# Patient Record
Sex: Male | Born: 1960 | Race: Black or African American | Hispanic: No | Marital: Married | Smoking: Former smoker
Health system: Southern US, Community
[De-identification: ages and names within clinical notes are randomized; demographics above are authoritative.]

## PROBLEM LIST (undated history)

## (undated) ENCOUNTER — Emergency Department (HOSPITAL_COMMUNITY): Admission: EM | Payer: Self-pay | Source: Home / Self Care

## (undated) VITALS — BP 123/76 | HR 83 | Temp 97.4°F | Resp 16

## (undated) DIAGNOSIS — I428 Other cardiomyopathies: Secondary | ICD-10-CM

## (undated) DIAGNOSIS — L03119 Cellulitis of unspecified part of limb: Secondary | ICD-10-CM

## (undated) DIAGNOSIS — F1424 Cocaine dependence with cocaine-induced mood disorder: Secondary | ICD-10-CM

## (undated) DIAGNOSIS — F1410 Cocaine abuse, uncomplicated: Secondary | ICD-10-CM

## (undated) DIAGNOSIS — E119 Type 2 diabetes mellitus without complications: Secondary | ICD-10-CM

## (undated) DIAGNOSIS — F329 Major depressive disorder, single episode, unspecified: Secondary | ICD-10-CM

## (undated) DIAGNOSIS — S2243XA Multiple fractures of ribs, bilateral, initial encounter for closed fracture: Secondary | ICD-10-CM

## (undated) DIAGNOSIS — I10 Essential (primary) hypertension: Secondary | ICD-10-CM

## (undated) DIAGNOSIS — F32A Depression, unspecified: Secondary | ICD-10-CM

## (undated) DIAGNOSIS — I5022 Chronic systolic (congestive) heart failure: Secondary | ICD-10-CM

## (undated) DIAGNOSIS — L02619 Cutaneous abscess of unspecified foot: Secondary | ICD-10-CM

## (undated) HISTORY — PX: FINGER SURGERY: SHX640

## (undated) HISTORY — DX: Cocaine dependence with cocaine-induced mood disorder: F14.24

## (undated) HISTORY — DX: Cocaine abuse, uncomplicated: F14.10

## (undated) HISTORY — DX: Multiple fractures of ribs, bilateral, initial encounter for closed fracture: S22.43XA

## (undated) HISTORY — DX: Other cardiomyopathies: I42.8

## (undated) HISTORY — DX: Chronic systolic (congestive) heart failure: I50.22

---

## 1999-12-22 ENCOUNTER — Emergency Department (HOSPITAL_COMMUNITY): Admission: EM | Admit: 1999-12-22 | Discharge: 1999-12-22 | Payer: Self-pay | Admitting: Emergency Medicine

## 2000-01-06 ENCOUNTER — Emergency Department (HOSPITAL_COMMUNITY): Admission: EM | Admit: 2000-01-06 | Discharge: 2000-01-06 | Payer: Self-pay

## 2001-07-21 ENCOUNTER — Encounter: Admission: RE | Admit: 2001-07-21 | Discharge: 2001-07-21 | Payer: Self-pay | Admitting: Family Medicine

## 2001-11-24 ENCOUNTER — Encounter: Admission: RE | Admit: 2001-11-24 | Discharge: 2001-11-24 | Payer: Self-pay | Admitting: Family Medicine

## 2002-06-28 ENCOUNTER — Encounter: Admission: RE | Admit: 2002-06-28 | Discharge: 2002-06-28 | Payer: Self-pay | Admitting: Family Medicine

## 2002-10-22 ENCOUNTER — Emergency Department (HOSPITAL_COMMUNITY): Admission: EM | Admit: 2002-10-22 | Discharge: 2002-10-23 | Payer: Self-pay | Admitting: Emergency Medicine

## 2002-10-23 ENCOUNTER — Encounter: Payer: Self-pay | Admitting: *Deleted

## 2003-11-13 ENCOUNTER — Inpatient Hospital Stay (HOSPITAL_COMMUNITY): Admission: AD | Admit: 2003-11-13 | Discharge: 2003-11-19 | Payer: Self-pay | Admitting: Orthopedic Surgery

## 2003-11-13 ENCOUNTER — Emergency Department (HOSPITAL_COMMUNITY): Admission: EM | Admit: 2003-11-13 | Discharge: 2003-11-13 | Payer: Self-pay | Admitting: Family Medicine

## 2003-11-20 ENCOUNTER — Encounter (HOSPITAL_BASED_OUTPATIENT_CLINIC_OR_DEPARTMENT_OTHER): Admission: RE | Admit: 2003-11-20 | Discharge: 2003-11-27 | Payer: Self-pay | Admitting: Orthopedic Surgery

## 2004-01-30 ENCOUNTER — Ambulatory Visit: Payer: Self-pay | Admitting: Family Medicine

## 2004-04-20 ENCOUNTER — Ambulatory Visit: Payer: Self-pay | Admitting: Family Medicine

## 2005-03-15 ENCOUNTER — Ambulatory Visit: Payer: Self-pay | Admitting: Family Medicine

## 2005-03-17 ENCOUNTER — Ambulatory Visit: Payer: Self-pay | Admitting: *Deleted

## 2005-04-16 ENCOUNTER — Ambulatory Visit: Payer: Self-pay | Admitting: Family Medicine

## 2005-05-17 ENCOUNTER — Ambulatory Visit: Payer: Self-pay | Admitting: Family Medicine

## 2005-06-11 ENCOUNTER — Emergency Department (HOSPITAL_COMMUNITY): Admission: EM | Admit: 2005-06-11 | Discharge: 2005-06-11 | Payer: Self-pay | Admitting: Emergency Medicine

## 2005-06-28 ENCOUNTER — Ambulatory Visit: Payer: Self-pay | Admitting: Family Medicine

## 2005-11-18 ENCOUNTER — Ambulatory Visit: Payer: Self-pay | Admitting: Family Medicine

## 2006-03-24 ENCOUNTER — Ambulatory Visit: Payer: Self-pay | Admitting: Family Medicine

## 2006-08-02 ENCOUNTER — Ambulatory Visit: Payer: Self-pay | Admitting: Family Medicine

## 2006-08-17 ENCOUNTER — Ambulatory Visit: Payer: Self-pay | Admitting: Internal Medicine

## 2006-09-08 ENCOUNTER — Ambulatory Visit: Payer: Self-pay | Admitting: Internal Medicine

## 2006-09-08 ENCOUNTER — Ambulatory Visit (HOSPITAL_COMMUNITY): Admission: RE | Admit: 2006-09-08 | Discharge: 2006-09-08 | Payer: Self-pay | Admitting: Internal Medicine

## 2006-12-07 DIAGNOSIS — I10 Essential (primary) hypertension: Secondary | ICD-10-CM | POA: Insufficient documentation

## 2006-12-21 ENCOUNTER — Ambulatory Visit: Payer: Self-pay | Admitting: Family Medicine

## 2006-12-21 DIAGNOSIS — J309 Allergic rhinitis, unspecified: Secondary | ICD-10-CM | POA: Insufficient documentation

## 2006-12-21 LAB — CONVERTED CEMR LAB: Blood Glucose, Fingerstick: 139

## 2006-12-28 ENCOUNTER — Encounter (INDEPENDENT_AMBULATORY_CARE_PROVIDER_SITE_OTHER): Payer: Self-pay | Admitting: *Deleted

## 2007-05-01 ENCOUNTER — Ambulatory Visit: Payer: Self-pay | Admitting: Nurse Practitioner

## 2007-05-01 DIAGNOSIS — J329 Chronic sinusitis, unspecified: Secondary | ICD-10-CM | POA: Insufficient documentation

## 2007-05-01 LAB — CONVERTED CEMR LAB: Hgb A1c MFr Bld: 6.8 %

## 2007-05-08 ENCOUNTER — Ambulatory Visit: Payer: Self-pay | Admitting: Nurse Practitioner

## 2007-05-08 LAB — CONVERTED CEMR LAB
Blood in Urine, dipstick: NEGATIVE
Nitrite: NEGATIVE
Specific Gravity, Urine: >=1.03
WBC Urine, dipstick: NEGATIVE
pH: 5.5

## 2007-05-09 ENCOUNTER — Telehealth (INDEPENDENT_AMBULATORY_CARE_PROVIDER_SITE_OTHER): Payer: Self-pay | Admitting: Nurse Practitioner

## 2007-05-09 ENCOUNTER — Encounter (INDEPENDENT_AMBULATORY_CARE_PROVIDER_SITE_OTHER): Payer: Self-pay | Admitting: Nurse Practitioner

## 2007-05-09 DIAGNOSIS — E7800 Pure hypercholesterolemia, unspecified: Secondary | ICD-10-CM | POA: Insufficient documentation

## 2007-05-09 LAB — CONVERTED CEMR LAB
ALT: 15 units/L (ref 0–53)
Albumin: 4.3 g/dL (ref 3.5–5.2)
Basophils Absolute: 0 10*3/uL (ref 0.0–0.1)
CO2: 26 meq/L (ref 19–32)
Calcium: 9.3 mg/dL (ref 8.4–10.5)
Chloride: 103 meq/L (ref 96–112)
Cholesterol: 230 mg/dL — ABNORMAL HIGH (ref 0–200)
Eosinophils Relative: 3 % (ref 0–5)
Glucose, Bld: 121 mg/dL — ABNORMAL HIGH (ref 70–99)
HCT: 42.2 % (ref 39.0–52.0)
Lymphocytes Relative: 48 % — ABNORMAL HIGH (ref 12–46)
Lymphs Abs: 1.2 10*3/uL (ref 0.7–4.0)
Neutro Abs: 0.9 10*3/uL — ABNORMAL LOW (ref 1.7–7.7)
Platelets: 257 10*3/uL (ref 150–400)
Potassium: 4 meq/L (ref 3.5–5.3)
Sodium: 138 meq/L (ref 135–145)
Total Protein: 6.8 g/dL (ref 6.0–8.3)
Triglycerides: 35 mg/dL (ref <150)
WBC: 2.4 10*3/uL — ABNORMAL LOW (ref 4.0–10.5)

## 2007-06-13 ENCOUNTER — Telehealth (INDEPENDENT_AMBULATORY_CARE_PROVIDER_SITE_OTHER): Payer: Self-pay | Admitting: Nurse Practitioner

## 2007-12-20 ENCOUNTER — Ambulatory Visit: Payer: Self-pay | Admitting: Nurse Practitioner

## 2007-12-20 LAB — CONVERTED CEMR LAB: Hgb A1c MFr Bld: 6.5 %

## 2007-12-22 LAB — CONVERTED CEMR LAB
Albumin: 4.3 g/dL (ref 3.5–5.2)
Alkaline Phosphatase: 43 units/L (ref 39–117)
BUN: 13 mg/dL (ref 6–23)
CO2: 23 meq/L (ref 19–32)
Cholesterol: 185 mg/dL (ref 0–200)
Eosinophils Absolute: 0.1 10*3/uL (ref 0.0–0.7)
Eosinophils Relative: 2 % (ref 0–5)
Glucose, Bld: 107 mg/dL — ABNORMAL HIGH (ref 70–99)
HCT: 38.9 % — ABNORMAL LOW (ref 39.0–52.0)
HDL: 80 mg/dL (ref >39)
LDL Cholesterol: 98 mg/dL (ref 0–99)
Lymphocytes Relative: 45 % (ref 12–46)
Lymphs Abs: 1.4 10*3/uL (ref 0.7–4.0)
MCV: 85.7 fL (ref 78.0–100.0)
Monocytes Relative: 7 % (ref 3–12)
Platelets: 232 10*3/uL (ref 150–400)
Potassium: 4.7 meq/L (ref 3.5–5.3)
RBC: 4.54 M/uL (ref 4.22–5.81)
Sodium: 138 meq/L (ref 135–145)
Total Bilirubin: 0.4 mg/dL (ref 0.3–1.2)
Total Protein: 6.7 g/dL (ref 6.0–8.3)
Triglycerides: 34 mg/dL (ref <150)
WBC: 3.2 10*3/uL — ABNORMAL LOW (ref 4.0–10.5)

## 2008-05-22 ENCOUNTER — Encounter (INDEPENDENT_AMBULATORY_CARE_PROVIDER_SITE_OTHER): Payer: Self-pay | Admitting: Nurse Practitioner

## 2008-08-06 ENCOUNTER — Ambulatory Visit: Payer: Self-pay | Admitting: Nurse Practitioner

## 2008-08-06 DIAGNOSIS — L259 Unspecified contact dermatitis, unspecified cause: Secondary | ICD-10-CM | POA: Insufficient documentation

## 2008-08-06 LAB — CONVERTED CEMR LAB
Bilirubin Urine: NEGATIVE
Cholesterol, target level: 200 mg/dL
HDL goal, serum: 40 mg/dL
Ketones, urine, test strip: NEGATIVE
LDL Goal: 100 mg/dL
Microalb, Ur: 0.5 mg/dL (ref 0.00–1.89)
Nitrite: NEGATIVE
Protein, U semiquant: NEGATIVE
Urobilinogen, UA: 0.2

## 2008-08-13 ENCOUNTER — Ambulatory Visit: Payer: Self-pay | Admitting: Nurse Practitioner

## 2008-08-16 ENCOUNTER — Ambulatory Visit: Payer: Self-pay | Admitting: Nurse Practitioner

## 2008-08-19 DIAGNOSIS — D72819 Decreased white blood cell count, unspecified: Secondary | ICD-10-CM | POA: Insufficient documentation

## 2008-08-19 LAB — CONVERTED CEMR LAB
ALT: 13 units/L (ref 0–53)
AST: 19 units/L (ref 0–37)
Chloride: 105 meq/L (ref 96–112)
Cholesterol: 173 mg/dL (ref 0–200)
Creatinine, Ser: 0.86 mg/dL (ref 0.40–1.50)
Eosinophils Absolute: 0.1 10*3/uL (ref 0.0–0.7)
HDL: 76 mg/dL (ref >39)
LDL Cholesterol: 90 mg/dL (ref 0–99)
Lymphocytes Relative: 52 % — ABNORMAL HIGH (ref 12–46)
Lymphs Abs: 1.5 10*3/uL (ref 0.7–4.0)
Monocytes Relative: 6 % (ref 3–12)
Neutro Abs: 1.1 10*3/uL — ABNORMAL LOW (ref 1.7–7.7)
Neutrophils Relative %: 38 % — ABNORMAL LOW (ref 43–77)
Platelets: 215 10*3/uL (ref 150–400)
RBC: 5.02 M/uL (ref 4.22–5.81)
Sodium: 141 meq/L (ref 135–145)
Total Protein: 6.9 g/dL (ref 6.0–8.3)
Triglycerides: 34 mg/dL (ref <150)
WBC: 2.8 10*3/uL — ABNORMAL LOW (ref 4.0–10.5)

## 2008-08-22 ENCOUNTER — Ambulatory Visit: Payer: Self-pay | Admitting: *Deleted

## 2008-09-20 ENCOUNTER — Ambulatory Visit: Payer: Self-pay | Admitting: Nurse Practitioner

## 2008-09-20 ENCOUNTER — Telehealth (INDEPENDENT_AMBULATORY_CARE_PROVIDER_SITE_OTHER): Payer: Self-pay | Admitting: Nurse Practitioner

## 2008-09-23 ENCOUNTER — Ambulatory Visit: Payer: Self-pay | Admitting: Hematology and Oncology

## 2008-09-23 LAB — CONVERTED CEMR LAB
Basophils Absolute: 0 10*3/uL (ref 0.0–0.1)
Basophils Relative: 1 % (ref 0–1)
Eosinophils Absolute: 0.1 10*3/uL (ref 0.0–0.7)
Eosinophils Relative: 4 % (ref 0–5)
Hemoglobin: 13.6 g/dL (ref 13.0–17.0)
MCHC: 32 g/dL (ref 30.0–36.0)
MCV: 87.4 fL (ref 78.0–100.0)
Monocytes Absolute: 0.2 10*3/uL (ref 0.1–1.0)
Monocytes Relative: 9 % (ref 3–12)
Neutro Abs: 1.1 10*3/uL — ABNORMAL LOW (ref 1.7–7.7)
RDW: 15.3 % (ref 11.5–15.5)

## 2008-09-26 ENCOUNTER — Encounter (INDEPENDENT_AMBULATORY_CARE_PROVIDER_SITE_OTHER): Payer: Self-pay | Admitting: Nurse Practitioner

## 2008-09-26 LAB — CBC WITH DIFFERENTIAL/PLATELET
BASO%: 0.6 % (ref 0.0–2.0)
MCHC: 34.6 g/dL (ref 32.0–36.0)
MONO#: 0.2 10*3/uL (ref 0.1–0.9)
RBC: 4.71 10*6/uL (ref 4.20–5.82)
RDW: 15 % — ABNORMAL HIGH (ref 11.0–14.6)
WBC: 2.6 10*3/uL — ABNORMAL LOW (ref 4.0–10.3)
lymph#: 1.1 10*3/uL (ref 0.9–3.3)

## 2008-09-26 LAB — MORPHOLOGY
PLT EST: ADEQUATE
RBC Comments: NORMAL

## 2008-10-01 LAB — COMPREHENSIVE METABOLIC PANEL
ALT: 13 U/L (ref 0–53)
Alkaline Phosphatase: 51 U/L (ref 39–117)
Glucose, Bld: 121 mg/dL — ABNORMAL HIGH (ref 70–99)
Sodium: 140 mEq/L (ref 135–145)
Total Bilirubin: 0.3 mg/dL (ref 0.3–1.2)
Total Protein: 7 g/dL (ref 6.0–8.3)

## 2008-10-01 LAB — VITAMIN B12: Vitamin B-12: 278 pg/mL (ref 211–911)

## 2008-10-07 ENCOUNTER — Ambulatory Visit (HOSPITAL_COMMUNITY): Admission: RE | Admit: 2008-10-07 | Discharge: 2008-10-07 | Payer: Self-pay | Admitting: Hematology and Oncology

## 2008-10-10 ENCOUNTER — Encounter (INDEPENDENT_AMBULATORY_CARE_PROVIDER_SITE_OTHER): Payer: Self-pay | Admitting: Nurse Practitioner

## 2009-02-12 ENCOUNTER — Ambulatory Visit: Payer: Self-pay | Admitting: Internal Medicine

## 2009-02-12 DIAGNOSIS — M5380 Other specified dorsopathies, site unspecified: Secondary | ICD-10-CM | POA: Insufficient documentation

## 2009-02-12 LAB — CONVERTED CEMR LAB: Blood Glucose, Fingerstick: 221

## 2009-02-25 ENCOUNTER — Emergency Department (HOSPITAL_COMMUNITY): Admission: EM | Admit: 2009-02-25 | Discharge: 2009-02-25 | Payer: Self-pay | Admitting: Emergency Medicine

## 2009-02-25 ENCOUNTER — Telehealth (INDEPENDENT_AMBULATORY_CARE_PROVIDER_SITE_OTHER): Payer: Self-pay | Admitting: Nurse Practitioner

## 2009-02-25 DIAGNOSIS — M5412 Radiculopathy, cervical region: Secondary | ICD-10-CM | POA: Insufficient documentation

## 2009-04-08 ENCOUNTER — Telehealth (INDEPENDENT_AMBULATORY_CARE_PROVIDER_SITE_OTHER): Payer: Self-pay | Admitting: Nurse Practitioner

## 2009-04-28 ENCOUNTER — Ambulatory Visit: Payer: Self-pay | Admitting: Hematology and Oncology

## 2009-08-22 ENCOUNTER — Emergency Department (HOSPITAL_COMMUNITY): Admission: EM | Admit: 2009-08-22 | Discharge: 2009-08-22 | Payer: Self-pay | Admitting: Family Medicine

## 2009-10-17 ENCOUNTER — Encounter (INDEPENDENT_AMBULATORY_CARE_PROVIDER_SITE_OTHER): Payer: Self-pay | Admitting: Nurse Practitioner

## 2010-05-03 ENCOUNTER — Encounter: Payer: Self-pay | Admitting: Internal Medicine

## 2010-05-12 NOTE — Letter (Signed)
Summary: MAILED REQUESTED RECORDS TO [facility]  MAILED REQUESTED RECORDS TO [facility]   Imported By: Arta Bruce 10/17/2009 12:00:15  _____________________________________________________________________  External Attachment:    Type:   Image     Comment:   External Document

## 2010-05-14 NOTE — Letter (Signed)
Summary: HEMATOLOGY/MEDICAL ONCOLOGY  HEMATOLOGY/MEDICAL ONCOLOGY   Imported By: Arta Bruce 04/20/2010 16:56:33  _____________________________________________________________________  External Attachment:    Type:   Image     Comment:   External Document

## 2010-05-14 NOTE — Letter (Signed)
Summary: HEMATOLOGY/MEDICAL ONCOLOGY  HEMATOLOGY/MEDICAL ONCOLOGY   Imported By: Arta Bruce 04/20/2010 16:54:51  _____________________________________________________________________  External Attachment:    Type:   Image     Comment:   External Document

## 2010-06-30 LAB — GLUCOSE, CAPILLARY

## 2010-07-15 LAB — GLUCOSE, CAPILLARY: Glucose-Capillary: 87 mg/dL (ref 70–99)

## 2010-08-10 ENCOUNTER — Emergency Department (HOSPITAL_COMMUNITY)
Admission: EM | Admit: 2010-08-10 | Discharge: 2010-08-10 | Disposition: A | Discharge status: Other Acute Inpatient Hospital | Payer: Self-pay | Attending: Emergency Medicine | Admitting: Emergency Medicine

## 2010-08-10 ENCOUNTER — Inpatient Hospital Stay (HOSPITAL_COMMUNITY)
Admission: AD | Admit: 2010-08-10 | Discharge: 2010-08-13 | DRG: 897 | Disposition: A | Discharge status: Home / Self Care | Payer: PRIVATE HEALTH INSURANCE | Source: Ambulatory Visit | Attending: Psychiatry | Admitting: Psychiatry

## 2010-08-10 DIAGNOSIS — R000 Tachycardia, unspecified: Secondary | ICD-10-CM | POA: Insufficient documentation

## 2010-08-10 DIAGNOSIS — Z91199 Patient's noncompliance with other medical treatment and regimen due to unspecified reason: Secondary | ICD-10-CM

## 2010-08-10 DIAGNOSIS — E119 Type 2 diabetes mellitus without complications: Secondary | ICD-10-CM | POA: Insufficient documentation

## 2010-08-10 DIAGNOSIS — I10 Essential (primary) hypertension: Secondary | ICD-10-CM | POA: Insufficient documentation

## 2010-08-10 DIAGNOSIS — F1994 Other psychoactive substance use, unspecified with psychoactive substance-induced mood disorder: Secondary | ICD-10-CM

## 2010-08-10 DIAGNOSIS — F1920 Other psychoactive substance dependence, uncomplicated: Secondary | ICD-10-CM

## 2010-08-10 DIAGNOSIS — Z794 Long term (current) use of insulin: Secondary | ICD-10-CM

## 2010-08-10 DIAGNOSIS — F3289 Other specified depressive episodes: Secondary | ICD-10-CM | POA: Insufficient documentation

## 2010-08-10 DIAGNOSIS — F329 Major depressive disorder, single episode, unspecified: Secondary | ICD-10-CM | POA: Insufficient documentation

## 2010-08-10 DIAGNOSIS — Z9119 Patient's noncompliance with other medical treatment and regimen: Secondary | ICD-10-CM

## 2010-08-10 DIAGNOSIS — F1420 Cocaine dependence, uncomplicated: Principal | ICD-10-CM

## 2010-08-10 DIAGNOSIS — F1010 Alcohol abuse, uncomplicated: Secondary | ICD-10-CM | POA: Insufficient documentation

## 2010-08-10 LAB — COMPREHENSIVE METABOLIC PANEL
ALT: 30 U/L (ref 0–53)
AST: 24 U/L (ref 0–37)
Alkaline Phosphatase: 77 U/L (ref 39–117)
CO2: 25 mEq/L (ref 19–32)
Calcium: 9.6 mg/dL (ref 8.4–10.5)
Chloride: 96 mEq/L (ref 96–112)
GFR calc Af Amer: >60 mL/min (ref >60)
GFR calc non Af Amer: >60 mL/min (ref >60)
Glucose, Bld: 447 mg/dL — ABNORMAL HIGH (ref 70–99)
Potassium: 3.9 mEq/L (ref 3.5–5.1)
Sodium: 131 mEq/L — ABNORMAL LOW (ref 135–145)

## 2010-08-10 LAB — GLUCOSE, CAPILLARY
Glucose-Capillary: 310 mg/dL — ABNORMAL HIGH (ref 70–99)
Glucose-Capillary: 433 mg/dL — ABNORMAL HIGH (ref 70–99)

## 2010-08-10 LAB — CBC
HCT: 46 % (ref 39.0–52.0)
Hemoglobin: 16.2 g/dL (ref 13.0–17.0)
RBC: 5.5 MIL/uL (ref 4.22–5.81)
WBC: 3.5 10*3/uL — ABNORMAL LOW (ref 4.0–10.5)

## 2010-08-10 LAB — RAPID URINE DRUG SCREEN, HOSP PERFORMED
Cocaine: POSITIVE — AB
Tetrahydrocannabinol: NOT DETECTED

## 2010-08-10 LAB — ETHANOL: Alcohol, Ethyl (B): <5 mg/dL (ref 0–10)

## 2010-08-11 DIAGNOSIS — F1420 Cocaine dependence, uncomplicated: Secondary | ICD-10-CM

## 2010-08-11 LAB — GLUCOSE, CAPILLARY
Glucose-Capillary: 492 mg/dL — ABNORMAL HIGH (ref 70–99)
Glucose-Capillary: 179 mg/dL — ABNORMAL HIGH (ref 70–99)

## 2010-08-11 LAB — URINALYSIS, ROUTINE W REFLEX MICROSCOPIC
Bilirubin Urine: NEGATIVE
Glucose, UA: >1000 mg/dL — AB
Hgb urine dipstick: NEGATIVE
Specific Gravity, Urine: 1.036 — ABNORMAL HIGH (ref 1.005–1.030)

## 2010-08-11 LAB — URINE MICROSCOPIC-ADD ON: Urine-Other: NONE SEEN

## 2010-08-12 LAB — GLUCOSE, CAPILLARY: Glucose-Capillary: 323 mg/dL — ABNORMAL HIGH (ref 70–99)

## 2010-08-12 LAB — HEMOGLOBIN A1C: Hgb A1c MFr Bld: 13.1 % — ABNORMAL HIGH (ref <5.7)

## 2010-08-13 LAB — GLUCOSE, CAPILLARY: Glucose-Capillary: 267 mg/dL — ABNORMAL HIGH (ref 70–99)

## 2010-08-14 NOTE — Discharge Summary (Signed)
  NAME:  KDYN, VONBEHREN NO.:  [id number]  MEDICAL RECORD NO.:  [id number]           PATIENT TYPE:  I  LOCATION:  0306                          [facility]ILITY[facility]  PHYSICIAN:  Anselm Jungling, MD  DATE OF BIRTH:  12-16-1960  DATE OF ADMISSION:  08/10/2010 DATE OF DISCHARGE:  08/13/2010                              DISCHARGE SUMMARY   IDENTIFYING DATA/REASON FOR ADMISSION:  This was an inpatient psychiatric admission for James [name], a 50 year old married African American male who was admitted for treatment of substance dependence.  Please refer to the admission note for further details pertaining to the symptoms, circumstances and history that led to his hospitalization.  He was given an initial Axis I diagnosis of cocaine dependence.  MEDICAL AND LABORATORY:  The patient was medically and physically assessed by the psychiatric nurse practitioner.  He was in good health, but had a history of diabetes mellitus, which he had not been attending to with routine medical care.  His blood sugars routinely ran in the 300 range.  He was placed on an insulin protocol, and at the time of discharge, he was discharged on a regimen of Lantus, glyburide, and metformin, along with appropriate referrals for follow-up.  See below. Aside from this, there were no significant medical issues.  HOSPITAL COURSE:  The patient was admitted to the adult inpatient psychiatric service.  He presented as a slender, normally-developed adult male who was very pleasant and cooperative, but with sad mood. His father passed away few months ago.  He indicated that he had had longstanding issues with substance abuse, but had been clean and sober for most of 5 years, until several months ago, when he relapsed on cocaine.  He expressed a lot of remorse for his relapse and its effect on his family.  He described good supports in his wife, and discharge pastor and the church community in general.  His sister  had also encouraged him to get treatment.  He participated in therapeutic groups and activities geared towards 12- step recovery.  He was an excellent participant throughout.  He was appropriate for discharge on the fourth hospital day.  He agreed to the following aftercare plan.  At the time of discharge, the suicide risk assessment was completed and he was felt to be at minimal risk.  AFTERCARE:  The patient was to follow-up at [facility] with appointments on May 23 at noon and May 30 at 2:30 p.m.  DISCHARGE MEDICATIONS: 1. Lantus insulin 10 mg subcu daily. 2. Glyburide 5 mg daily. 3. Metformin 500 mg daily.  DIAGNOSES:  AXIS I:  Cocaine dependence. AXIS II:  Deferred. AXIS III:  Diabetes mellitus, insulin-dependent. AXIS IV:  Stressors severe. AXIS V: GAF on discharge 50.     Anselm Jungling, MD     SPB/MEDQ  D:  08/13/2010  T:  08/13/2010  Job:  [id number]  Electronically Signed by Geralyn Flash MD on 08/14/2010 09:03:22 AM

## 2010-08-17 NOTE — H&P (Signed)
NAME:  James [name], James [name] NO.:  [id number]  MEDICAL RECORD NO.:  [id number]           PATIENT TYPE:  I  LOCATION:  0306                          FACILITY:  [facility]  PHYSICIAN:  Anselm Jungling, MD  DATE OF BIRTH:  21-Mar-1961  DATE OF ADMISSION:  08/10/2010 DATE OF DISCHARGE:                      PSYCHIATRIC ADMISSION ASSESSMENT   This is on a 50 year old male voluntarily admitted on August 10, 2010.  HISTORY OF PRESENT ILLNESS:  The patient is here as he relapsed on crack cocaine in November.  He was sober for approximately 5 months and then relapsed.  He has been using daily.  He has been using cocaine on and off for the past 12 years, did have a period of sobriety.  He also has been drinking some alcohol, but denies any seizures or withdrawal symptoms, denies any suicidal thoughts.  He was hoping for some placement, but he has a court date pending tomorrow and he feels he may need to attend his court case.  He has a history of medical issues and has been noncompliant with his diabetes for at least 1 year.  PAST PSYCHIATRIC HISTORY:  First admission to [facility]. No current outpatient mental health treatment.  SOCIAL HISTORY:  He is a 50 year old married male.  He has 3 children, 2 are adults.  He lives in [place].  He is unemployed.  Again, he has a court date pending tomorrow related to a vehicle registration.  FAMILY HISTORY:  None.  ALCOHOL AND DRUG HISTORY:  Longest history of sobriety has been 5 years. He denies any seizures and denies any IV drug use.  PRIMARY CARE PROVIDER:  None.  Has been noncompliant with follow-up for at least 1 year.  He was going to [facility] in the past.  MEDICAL PROBLEMS:  History of non-insulin diabetes mellitus.  MEDICATIONS:  He lists metformin, but again off medications for 1 year.  DRUG ALLERGIES:  NO KNOWN ALLERGIES.  PHYSICAL EXAMINATION:  GENERAL:  This is a slender middle-aged male in no  distress.  He voices no complaints.  Sodium of 131.  Urine drug screen is positive for cocaine.  Alcohol level less than 5.  His glucoses were elevated at 439, down to 185 on discharge.  MENTAL STATUS EXAM:  He is fully alert and cooperative with fair eye contact, cooperative, casually dressed.  Speech soft-spoken, but normal pace.  Mood; he feels clear in his head.  He denies any psychotic symptoms.  Thought processes are coherent, goal directed.  He wants help.  Axis I:  Cocaine dependence, substance-induced mood disorder. Axis II:  Deferred. Axis III:  History of non-insulin-dependent diabetes. Axis IV:  Medical problems, other psychosocial problems rated to chronic substance use and legal system. Axis V:  Global Assessment of Functioning current is 40.  Our plan is to check his blood sugars twice a day.  We will have Librium available on a very temporary basis.  We will address other comorbidities.  Assess his motivation for rehab.  Encourage follow-up with his medical issues.  Tentative length of stay is 2-3 days.     Landry Corporal, N.P.  ______________________________ Anselm Jungling, MD    JO/MEDQ  D:  08/11/2010  T:  08/12/2010  Job:  [id number]  Electronically Signed by Limmie PatriciaP. on 08/17/2010 09:41:36 AM Electronically Signed by Geralyn Flash MD on 08/17/2010 12:12:06 PM

## 2010-08-28 NOTE — Discharge Summary (Signed)
NAME:  James [name], James [name] NO.:  [id number]   MEDICAL RECORD NO.:  [id number]                   PATIENT TYPE:  INP   LOCATION:  5702                                 FACILITY:  [facility]   PHYSICIAN:  Dionne Ano. Amanda Pea, M.D.             DATE OF BIRTH:  10/05/1960   DATE OF ADMISSION:  11/13/2003  DATE OF DISCHARGE:  11/19/2003                                 DISCHARGE SUMMARY   ADMITTING DIAGNOSES:  1.  Abscess about the right hand and middle finger.  2.  History of diabetes mellitus, poorly controlled.  3.  History of crack cocaine abuse.  4.  History of tobacco abuse.  5.  History of ETOH abuse.   DISCHARGE DIAGNOSES:  1.  Abscess about the right hand and middle finger, improved.  2.  History of diabetes mellitus, poorly controlled.  3.  History of crack cocaine abuse.  4.  History of tobacco abuse.  5.  History of ETOH abuse.   PROCEDURES:  I&D of right and middle finger urgently by Dr. Dionne Ano.  Gramig.   CONSULTS:  Behavioral Health/psychiatry, Dr. Antonietta Breach.   BRIEF HISTORY:  James [name] is a 50 year old gentleman who presented to the  [facility] with pain, tenderness and swelling of  the middle finger of the right hand, noted for quite a few days, however, he  had not sought any medical attention for this problem until November 13, 2003.  Hand surgery and upper extremity surgeon, Dr. Dominica Severin, was contact  and evaluated the patient.  He was found to have an abscess deep in location  to the right hand and middle finger.  He was given a local anesthetic block  at the wrist and the area was I&D'ed without difficulty; he tolerated this  very well; this was performed in the emergency room setting.  Intraoperative  cultures were obtained.  He was to be admitted for IV antibiotics, whirlpool  and await final culture for appropriate antibiotic regime.  In addition to  this, given his dependent history of drugs and  alcohol without withdrawal  noted by the patient, a Behavioral Health consult was placed.   HOSPITAL COURSE:  The patient was admitted on November 13, 2003.  A Behavioral  Health consult was placed.  He was started on IV antibiotics in the form of  vancomycin, prophylactic, to cover for MRSA.  Intraoperative cultures were  pending.  He underwent a lengthy stay for wound care including hydrotherapy,  dressing changes and range of motion.  His condition continued to improve as  it became less tender and his motion improved.  Final cultures were obtained  that showed abundant MRSA, sensitive to vancomycin and tetracycline.  Patient's overall condition improved.  He was seen and evaluated by  [facility], who determined that he would be appropriate for  outpatient therapy.  Given the patient's financial situation, he declined  any type therapy that would be costly to him.  He was noted to be poorly  controlled in terms of his diabetes; he was on a sliding-scale insulin and  there were a few incidences on inpatient where he was noted to be eating  sweets brought in from home, despite redirection.  On postoperative day #4,  he was doing well, he was afebrile and he underwent a local debridement at  bedside; he tolerated this well.  He continued wet-to-dry dressing changes  and hydrotherapy.  His condition continued to improve.  His CBGs continued  to wax and wane, highs being in the mid-200s, despite his oral medications  and sliding-scale insulin, however, he remained asymptomatic.  On  postoperative day #6, he was doing well overall, much improved, vital signs  stable, he was afebrile.  His CBG was 265.  Initial x-rays were negative for  any osteomyelitis.  Dressing was changed.  He had no signs of  deep abscess  returning, no active discharge.  Neurovascularly, he was intact and his  range of motion was intact.   FINAL DIAGNOSES:  1.  Status post I&D of the right middle finger and hand  abscess with      cultures indicative of MRSA.  2.  History of poorly controlled diabetes mellitus.  3.  History of crack cocaine abuse.  4.  History of alcohol abuse.  5.  History of tobacco abuse.   PLAN:   CONDITION ON DISCHARGE:  Improved.   DIET:  Diet is a diabetic diet.   WOUND CARE:  Outpatient therapy for whirlpool and dressing changes.  He  needs to move his fingers frequently and keep his dressing clean and dry.  He will be given home dressing supplies to do wet-to-dry dressing changes.   DISCHARGE MEDICATIONS:  1.  Doxycycline 100 mg b.i.d. for 4 weeks.  2.  Percocet and Robaxin for pain and spasm.   FOLLOWUP:  Followup will be with outpatient hydrotherapy and dressing  changes.  He will need b.i.d. wet-to-dry dressing changes and home dressing  changes on the days when hydrotherapy is closed.  He will follow up with Dr.  Amanda Pea in 1 week; call [phone] for an appointment, questions or concerns.  We additionally instructed to have him follow up with a primary care  physician, however, he is somewhat resistant to this.      Karie Chimera, P.A.-C.                   Dionne Ano. Amanda Pea, M.D.    BB/MEDQ  D:  12/20/2003  T:  12/21/2003  Job:  [id number]

## 2010-08-28 NOTE — H&P (Signed)
NAME:  James, [name] NO.:  [id number]   MEDICAL RECORD NO.:  [id number]                   PATIENT TYPE:  EMS   LOCATION:  URG                                  FACILITY:  [facility]   PHYSICIAN:  Dionne Ano. Everlene Other, M.D.         DATE OF BIRTH:  29-Nov-1960   DATE OF ADMISSION:  11/13/2003  DATE OF DISCHARGE:                                HISTORY & PHYSICAL   HISTORY OF PRESENT ILLNESS:  James [name] is a 50 year old who presents to  the [facility] for evaluation of an infected right hand.  The  patient is 50 years of age.  He notes that he has pain and swelling in the  middle finger of the right hand.  He admits to crack cocaine abuse.  He is  here with his wife.  He has noticed the swelling for some days but has not  sought any medical attention.  He is a diabetic.  The patient has not been  home in a few days according to his wife.  I have reviewed all issues at  length.  He has an x-ray which is negative.  He has a grossly infected  finger that I have been asked to see.   PAST MEDICAL HISTORY:  Diabetes mellitus with poor control and poor follow  up.   PAST SURGICAL HISTORY:  None.   MEDICATIONS:  Avandia and Glucovance.   ALLERGIES:  None.   SOCIAL HISTORY:  He is unemployed.  He smokes 4-5 cigarettes a day.  He  occasionally consumes alcohol.   PHYSICAL EXAMINATION:  VITAL SIGNS:  Stable.  GENERAL:  The patient is alert.  HEENT:  Within normal limits.  CHEST:  Clear.  ABDOMEN:  Benign.  EXTREMITIES:  Lower extremity examination is benign.  Left upper extremity  reveals normal bony stability and alignment.  The patient's right middle  finger is grossly infected with a large, deep abscess about the dorsal and  volar aspects.  This is not a typical paronychial process but a large deep  abscess extending into the finger.  I have reviewed this at length.  His FDP  and FDS tendons are poorly functioning due to pain.  He has no frank  Navel's  signs into the palm or proximal wrist region.   X-rays are reviewed which show no fracture dislocation, osteomyelitis, or  space occupying lesion.   IMPRESSION:  Abscess deep in location right hand and middle finger.   PLAN:  The patient was verbally consented and discussed I&D.  Following  this, he was given a wrist block anesthetic with lidocaine.  Following this,  he was prepped and draped in the usual sterile fashion with Betadine scrub  and paint.  Once this was done, an incision was made and deep abscess was  noted.  This was cultured with aerobic and anaerobic cultures.  He tolerated  this well, there were no complicating features.  Following  this, I debrided  the skin, subcutaneous tissues, and followed the tracking dorsally and  volarly ascending into the finger.  Once this was done, I removed  devitalized tissue.  Following this, the patient had the wound dressed with  Adaptic followed by gauze and a sterile dressing.  He did have ulcerative  type areas which were quite deep and significant and due to this, I  discussed with him the implications and possible issues of loss of his  finger.   He was admitted to the hospital for IV antibiotics for likely presumed  community acquired MRSA.  We will await the culture results.  We will give  him a tetanus shot if not already given, monitor his blood sugars closely,  and proceed accordingly with his care.  He will need whirlpool/hydrotherapy  and close observation.  He would like to obtain help for his crack cocaine  abuse and we will consult behavioral health for this.  All questions have  been encouraged and answered.                                                Dionne Ano. Everlene Other, M.D.    Nash Mantis  D:  11/13/2003  T:  11/13/2003  Job:  [id number]

## 2012-02-26 ENCOUNTER — Emergency Department (HOSPITAL_COMMUNITY): Payer: Self-pay

## 2012-02-26 ENCOUNTER — Encounter (HOSPITAL_COMMUNITY): Payer: Self-pay | Admitting: Emergency Medicine

## 2012-02-26 ENCOUNTER — Emergency Department (HOSPITAL_COMMUNITY)
Admission: EM | Admit: 2012-02-26 | Discharge: 2012-02-28 | Disposition: A | Discharge status: Home / Self Care | Payer: Self-pay | Attending: Emergency Medicine | Admitting: Emergency Medicine

## 2012-02-26 DIAGNOSIS — E119 Type 2 diabetes mellitus without complications: Secondary | ICD-10-CM | POA: Insufficient documentation

## 2012-02-26 DIAGNOSIS — R0602 Shortness of breath: Secondary | ICD-10-CM | POA: Insufficient documentation

## 2012-02-26 DIAGNOSIS — R0789 Other chest pain: Secondary | ICD-10-CM | POA: Insufficient documentation

## 2012-02-26 DIAGNOSIS — R079 Chest pain, unspecified: Secondary | ICD-10-CM

## 2012-02-26 DIAGNOSIS — R45851 Suicidal ideations: Secondary | ICD-10-CM

## 2012-02-26 DIAGNOSIS — F1910 Other psychoactive substance abuse, uncomplicated: Secondary | ICD-10-CM

## 2012-02-26 HISTORY — DX: Type 2 diabetes mellitus without complications: E11.9

## 2012-02-26 LAB — RAPID URINE DRUG SCREEN, HOSP PERFORMED
Amphetamines: NOT DETECTED
Benzodiazepines: NOT DETECTED
Cocaine: POSITIVE — AB
Opiates: NOT DETECTED
Tetrahydrocannabinol: POSITIVE — AB

## 2012-02-26 LAB — CBC WITH DIFFERENTIAL/PLATELET
Eosinophils Relative: 3 % (ref 0–5)
Lymphocytes Relative: 34 % (ref 12–46)
Lymphs Abs: 1.4 10*3/uL (ref 0.7–4.0)
MCV: 84.2 fL (ref 78.0–100.0)
Platelets: 239 10*3/uL (ref 150–400)
RBC: 4.87 MIL/uL (ref 4.22–5.81)
WBC: 4.2 10*3/uL (ref 4.0–10.5)

## 2012-02-26 LAB — BASIC METABOLIC PANEL
CO2: 27 mEq/L (ref 19–32)
Calcium: 9.3 mg/dL (ref 8.4–10.5)
Chloride: 98 mEq/L (ref 96–112)
Glucose, Bld: 258 mg/dL — ABNORMAL HIGH (ref 70–99)
Potassium: 3.9 mEq/L (ref 3.5–5.1)
Sodium: 133 mEq/L — ABNORMAL LOW (ref 135–145)

## 2012-02-26 LAB — GLUCOSE, CAPILLARY
Glucose-Capillary: 213 mg/dL — ABNORMAL HIGH (ref 70–99)
Glucose-Capillary: 287 mg/dL — ABNORMAL HIGH (ref 70–99)

## 2012-02-26 MED ORDER — LORAZEPAM 1 MG PO TABS
1.0000 mg | ORAL_TABLET | ORAL | Status: DC | PRN
  Administered 2012-02-27 – 2012-02-28 (×2): 1 mg via ORAL
  Filled 2012-02-26 (×2): qty 1

## 2012-02-26 MED ORDER — ASPIRIN 81 MG PO CHEW
324.0000 mg | CHEWABLE_TABLET | Freq: Once | ORAL | Status: AC
  Administered 2012-02-26: 324 mg via ORAL
  Filled 2012-02-26: qty 4

## 2012-02-26 MED ORDER — ONDANSETRON HCL 8 MG PO TABS: 4.0000 mg | ORAL_TABLET | Freq: Three times a day (TID) | ORAL | Status: DC | PRN

## 2012-02-26 MED ORDER — METFORMIN HCL 500 MG PO TABS
500.0000 mg | ORAL_TABLET | Freq: Two times a day (BID) | ORAL | Status: DC
  Administered 2012-02-26 – 2012-02-28 (×5): 500 mg via ORAL
  Filled 2012-02-26 (×5): qty 1

## 2012-02-26 MED ORDER — ACETAMINOPHEN 325 MG PO TABS
650.0000 mg | ORAL_TABLET | ORAL | Status: DC | PRN
  Administered 2012-02-26: 650 mg via ORAL
  Filled 2012-02-26: qty 2

## 2012-02-26 MED ORDER — NICOTINE 21 MG/24HR TD PT24
21.0000 mg | MEDICATED_PATCH | Freq: Every day | TRANSDERMAL | Status: DC
  Filled 2012-02-26 (×3): qty 1

## 2012-02-26 NOTE — ED Notes (Signed)
First meeting with patient. Patient states he need help with SI and detox from drugs. Patient state he has a headache and denies chest pain and/or SOB at this time. Patient was given a breakfast tray and is eating with NAD at this time.

## 2012-02-26 NOTE — ED Provider Notes (Addendum)
James [name] is a 51 y.o. male was initially here for her detoxification from cocaine. After he was told that that was not available. He stated he was suicidal. He continues to feel that way. He threatens to put a razor to his wrist and cut. He is asked the sitter for a razor. He was seen by [facility] and felt that he was stable for discharge. On my evaluation. He is not  stable for discharge. He continues to be suicidal. When he initially talked to [facility], he was not communicative. When he was talking again in the afternoon; he was communicative and related suicidality to them as well. He is homeless. He left his home with his wife because he was using drugs and stole things from her.   [facility] is attempting to place the patient  Flint Melter, MD 02/26/12 1728  Flint Melter, MD 02/26/12 [phone]

## 2012-02-26 NOTE — ED Notes (Signed)
Patient verbalized that he had thoughts of harming himself yesterday.  Notified Dr. Bebe Shaggy of patient comment

## 2012-02-26 NOTE — ED Notes (Signed)
Patient requesting update on status. Updated patient on current POC and pending placement

## 2012-02-26 NOTE — ED Notes (Signed)
EMS reports that patient was laying outside complaining of chest pain patient admitted to cocaine use tonight and has been using the past 8 months and verbalizes that he wants help.

## 2012-02-26 NOTE — ED Notes (Signed)
Meal tray ordered 

## 2012-02-26 NOTE — ED Notes (Signed)
Patient ask sitter for a blade to kill himself with and this is after patient called his wife and daughter to let them know he is in the hospital. [facility] Baxter Hire) advised. Sitter remains at bedside.

## 2012-02-26 NOTE — ED Notes (Signed)
Patient sleeping with NAD

## 2012-02-26 NOTE — ED Notes (Signed)
Patient skin felt hot to touch and patient had non-productive cough. Lung Sounds are clear, all lobes, bilaterally.
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
## 2012-02-26 NOTE — ED Provider Notes (Signed)
History     CSN: [id number]  Arrival date & time 02/26/12  0505   First MD Initiated Contact with Patient 02/26/12 [id number]      Chief Complaint  Patient presents with  . Chest Pain    Patient is a 51 y.o. male presenting with chest pain. The history is provided by the patient.  Chest Pain Episode onset: an uknown time ago. Episode Length: an unknown time. Chest pain occurs constantly. The chest pain is resolved. Associated with: unknown. The severity of the pain is mild. The quality of the pain is described as pressure-like. The pain does not radiate. Primary symptoms include shortness of breath.   Pt reports he has had chest pain.  He is unable to tell me when it started and also unable to tell me how long it lasted.  He reports it is improved.  He reports crack cocaine use, but reports he was not using crack when CP started.   He reports he has never had this CP before  He is requesting detox from crack cocaine.  He reports he uses alcohol intermittently but admits that crack cocaine is his drug of choice  He denies known h/o CAD.   He denies pleuritic type CP   Past Medical History  Diagnosis Date  . Diabetes mellitus without complication     History reviewed. No pertinent past surgical history.  No family history on file.  History  Substance Use Topics  . Smoking status: Current Every Day Smoker -- 1.0 packs/day  . Smokeless tobacco: Not on file  . Alcohol Use:       Review of Systems  Respiratory: Positive for shortness of breath.   Cardiovascular: Positive for chest pain.  All other systems reviewed and are negative.    Allergies  Review of patient's allergies indicates no known allergies.  Home Medications  No current outpatient prescriptions on file.  BP 130/74  Temp 97.9 F (36.6 C) (Oral)  Resp 20  Ht 6' (1.829 m)  Wt 174 lb (78.926 kg)  BMI 23.60 kg/m2  SpO2 100%  Physical Exam CONSTITUTIONAL: Well developed/well nourished, no distress,  resting comfortably HEAD AND FACE: Normocephalic/atraumatic EYES: EOMI/PERRL ENMT: Mucous membranes moist NECK: supple no meningeal signs SPINE:entire spine nontender CV: S1/S2 noted, no murmurs/rubs/gallops noted LUNGS: Lungs are clear to auscultation bilaterally, no apparent distress Chest - nontender, no crepitance ABDOMEN: soft, nontender, no rebound or guarding GU:no cva tenderness NEURO: Pt is awake/alert, moves all extremitiesx4 EXTREMITIES: pulses normal, full ROM SKIN: warm, color normal PSYCH: no abnormalities of mood noted  ED Course  Procedures  6:50 AM I told patient that he can f/u as outpatient for detox for cocaine.  He later told nurse he wanted to harm himself.  D/w ACT, will see patient.  Will consult telepsych  On re-exam, pt is half asleep, no distress.   I doubt ACS/PE/dissection at this time given his history/exam.  Do not feel further chest pain workup/labs/imaging required.    MDM  Nursing notes including past medical history and social history reviewed and considered in documentation xrays reviewed and considered Labs/vital reviewed and considered        Date: 02/26/2012  Rate: 91  Rhythm: normal sinus rhythm  QRS Axis: right  Intervals: normal  ST/T Wave abnormalities: nonspecific ST changes  Conduction Disutrbances:none  Narrative Interpretation:   Old EKG Reviewed: none available at time of interpretation    Joya Gaskins, MD 02/26/12 0700

## 2012-02-26 NOTE — ED Notes (Signed)
Patient using phone at desk 

## 2012-02-27 LAB — GLUCOSE, CAPILLARY
Glucose-Capillary: 241 mg/dL — ABNORMAL HIGH (ref 70–99)
Glucose-Capillary: 246 mg/dL — ABNORMAL HIGH (ref 70–99)

## 2012-02-27 MED ORDER — INSULIN ASPART PROT & ASPART (70-30 MIX) 100 UNIT/ML ~~LOC~~ SUSP
10.0000 [IU] | Freq: Every day | SUBCUTANEOUS | Status: DC
  Administered 2012-02-27: 10 [IU] via SUBCUTANEOUS
  Filled 2012-02-27 (×2): qty 3

## 2012-02-27 NOTE — ED Notes (Signed)
First meeting with patient. Patient awake and sitting up in bed eating breakfast with NAD. Sitter at bedside.

## 2012-02-27 NOTE — ED Notes (Signed)
CBG 246 ?

## 2012-02-27 NOTE — ED Notes (Signed)
CBG 268

## 2012-02-27 NOTE — ED Notes (Addendum)
PHARMACY NOTIFIED ON PT.S NOVOLOG INSULIN ORDER DUE AT 10 PM .

## 2012-02-27 NOTE — ED Notes (Addendum)
Patient sitting on side of bed eating dinner with NAD. Patient is talking and laughing with staff. Sitter at bedside.

## 2012-02-27 NOTE — Progress Notes (Signed)
02/27/12 2200  Clinical Encounter Type  Visited With Patient  Visit Type Behavioral Health   the nurse asked me to visit the patient per the patient's request.  I spent an hour talking, listening nd praying with the patient about his life changing concerns. Vonzella Nipple, Chaplain
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
## 2012-02-27 NOTE — ED Notes (Signed)
CHAPLAIN AT BEDSIDE SPEAKING WITH PT.

## 2012-02-27 NOTE — ED Notes (Signed)
ACT Berna Spare) at bedside.

## 2012-02-27 NOTE — ED Notes (Signed)
PT. GIVEN [place] SANDWICH / GRAHAM CRACKERS / PEANUT BUTTER  AND DIET DRINK.

## 2012-02-27 NOTE — ED Notes (Addendum)
Patient asking to go home. Patient continued with SI at 0730 this morning and at this time when asked if he is continuing to have SI shakes his head no and states I don't know. Patient continues to have flat affect and appears to be depressed. EDP and ACT Berna Spare) advised.  Sitter at bedside.
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
## 2012-02-28 NOTE — ED Notes (Signed)
Pt refused his nicotine patch.  States that he does not need it "I have not felt like smoking I would like to hold off on taking it".  Medication held at pt request.

## 2012-02-28 NOTE — BH Assessment (Signed)
Assessment Note   James [name] is an 51 y.o. male that was reassessed today after requesting to leave AMA.  Writer spoke extensively with pt who ensures writer that "I am feeling better, but I really want to make things better at home and I cannot while I am in here." Writer continues to recommend inpatient treatment to show his commitment to remaining sober, but pt doubts that inpatient would be his most beneficial option.  Pt denies any current suicidal ideation "I think I said yesterday that  I wanted to cut myself with a razor, not KILL myself" or intent currently.  Pt admits that he has messed up his life and his relationship with his wife d/t his use, and is requesting outpatient materials for possible treatment programs, but does not feel that he warrants inpatient treatment at this time.  Pt feels that he can make things up to his family and if not, "I will keep trying until they forgive me."  Pt is hopeful that things will improve by Thanksgiving, "but if not, I still have family that I can be with and that support me."  Pt was provided with several different outpatient resources, including [facility] and [address].  Pt is able to contract for safety.  Writer consulted with Dr. Lisabeth Devoid and nursing staff, who both agree that pt is not ready to commit to sobriety and that his statements were related to feelings of guilt and remorse over stealing from his wife, not his ongoing use.  Pt was given emergency resources and referral information and also signed a no-harm contract.  Awaiting disposition by Dr. Lisabeth Devoid after he meets with pt.    Axis I: Substance Induced Mood Disorder and Cocaine Dependence Axis II: Deferred Axis III:  Past Medical History  Diagnosis Date  . Diabetes mellitus without complication    Axis IV: housing problems, other psychosocial or environmental problems, problems related to social environment and problems with primary support group Axis V: 31-40 impairment in reality  testing  Past Medical History:  Past Medical History  Diagnosis Date  . Diabetes mellitus without complication     History reviewed. No pertinent past surgical history.  Family History: No family history on file.  Social History:  reports that he has been smoking.  He does not have any smokeless tobacco history on file. He reports that he uses illicit drugs (Cocaine) about 5 times per week. His alcohol history not on file.  Additional Social History:  Alcohol / Drug Use Pain Medications: see MAR Prescriptions: see MAR Over the Counter: see MAR History of alcohol / drug use?: Yes Substance #1 Name of Substance 1: Crack cocaine 1 - Age of First Use: 41 1 - Amount (size/oz): $100-200 1 - Frequency: Daily 1 - Duration: ongoing for 8 months 1 - Last Use / Amount: 02/26/12 - "I smoked a lot.  I can't remember how much."  CIWA: CIWA-Ar BP: 135/97 mmHg Pulse Rate: 89  COWS: Clinical Opiate Withdrawal Scale (COWS) Resting Pulse Rate: Pulse Rate 101-120 (101) Sweating: Subjective report of chills or flushing Restlessness: Able to sit still Pupil Size: Pupils pinned or normal size for room light Bone or Joint Aches: Mild diffuse discomfort Runny Nose or Tearing: Nasal stuffiness or unusually moist eyes GI Upset: No GI symptoms Tremor: Tremor can be felt, but not observed Yawning: No yawning Anxiety or Irritability: Patient reports increasing irritability or anxiousness Gooseflesh Skin: Piloerection of skin can be felt or hairs standing up on arms COWS Total  Score: 10   Allergies: No Known Allergies  Home Medications:  (Not in a hospital admission)  OB/GYN Status:  No LMP for male patient.  General Assessment Data Location of Assessment: [facility] ED ACT Assessment: Yes Living Arrangements: Spouse/significant other Can pt return to current living arrangement?: Yes Admission Status: Voluntary Is patient capable of signing voluntary admission?: Yes Transfer from: Acute  Hospital Referral Source: Self/Family/Friend  Education Status Is patient currently in school?: No  Risk to self Suicidal Ideation: No Suicidal Intent: No Is patient at risk for suicide?: Yes Suicidal Plan?: No Specify Current Suicidal Plan: "said I only planned to CUT myself, not KILL myself" with a razor Access to Means: Yes Specify Access to Suicidal Means: sharps and medications available outside of the ED What has been your use of drugs/alcohol within the last 12 months?: Crack Use Previous Attempts/Gestures: No How many times?: 0  Other Self Harm Risks: reckless and impulsive Triggers for Past Attempts: Spouse contact;Other personal contacts;Unpredictable Intentional Self Injurious Behavior: Damaging Comment - Self Injurious Behavior: ongoing Crack Use Family Suicide History: No Recent stressful life event(s): Conflict (Comment);Loss (Comment);Turmoil (Comment) (wife won't speak to him; job loss; no current plan ) Persecutory voices/beliefs?: No Depression: Yes Depression Symptoms: Guilt;Feeling worthless/self pity;Feeling angry/irritable Substance abuse history and/or treatment for substance abuse?: Yes Suicide prevention information given to non-admitted patients: Not applicable  Risk to Others Homicidal Ideation: No Thoughts of Harm to Others: No Current Homicidal Intent: No Current Homicidal Plan: No Access to Homicidal Means: No Identified Victim: none per pt History of harm to others?: No Assessment of Violence: None Noted Violent Behavior Description: Aancy to leave, but cooperative Does patient have access to weapons?: No Criminal Charges Pending?: No Does patient have a court date: No  Psychosis Hallucinations: None noted Delusions: None noted  Mental Status Report Appear/Hygiene:  (casual in scrubs) Eye Contact: Good Motor Activity: Unremarkable Speech: Logical/coherent Level of Consciousness: Alert Mood: Ambivalent;Preoccupied Affect:  Apprehensive;Appropriate to circumstance Anxiety Level: Minimal Thought Processes: Relevant;Coherent Judgement: Impaired Orientation: Person;Place;Time;Situation Obsessive Compulsive Thoughts/Behaviors: Severe  Cognitive Functioning Concentration: Decreased Memory: Recent Impaired;Remote Intact IQ: Average Insight: Poor Impulse Control: Poor Appetite: Fair Weight Loss:  ("don't know how much, but a lot recently") Weight Gain: 0  Sleep: Decreased Total Hours of Sleep:  (sleeping better since here) Vegetative Symptoms: None  ADLScreening [facility] Assessment Services) Patient's cognitive ability adequate to safely complete daily activities?: Yes Patient able to express need for assistance with ADLs?: Yes Independently performs ADLs?: Yes (appropriate for developmental age)  Abuse/Neglect [facility]) Physical Abuse: Denies Verbal Abuse: Denies Sexual Abuse: Denies  Prior Inpatient Therapy Prior Inpatient Therapy: Yes Prior Therapy Dates: 2012 Prior Therapy Facilty/Provider(s): [facility] Reason for Treatment: SA  Prior Outpatient Therapy Prior Outpatient Therapy: No Prior Therapy Dates: na Prior Therapy Facilty/Provider(s): na Reason for Treatment: na  ADL Screening (condition at time of admission) Patient's cognitive ability adequate to safely complete daily activities?: Yes Patient able to express need for assistance with ADLs?: Yes Independently performs ADLs?: Yes (appropriate for developmental age) Weakness of Legs: None Weakness of Arms/Hands: None  Home Assistive Devices/Equipment Home Assistive Devices/Equipment: None    Abuse/Neglect Assessment (Assessment to be complete while patient is alone) Physical Abuse: Denies Verbal Abuse: Denies Sexual Abuse: Denies Exploitation of patient/patient's resources: Denies Self-Neglect: Denies Values / Beliefs Cultural Requests During Hospitalization: None Spiritual Requests During Hospitalization: None Consults Spiritual Care  Consult Needed: No Social Work Consult Needed: No Merchant navy officer (For Healthcare) Advance Directive: Patient does not have  advance directive;Patient would not like information    Additional Information 1:1 In Past 12 Months?: No CIRT Risk: No Elopement Risk: No Does patient have medical clearance?: Yes     Disposition:  Awaiting Dr. Neal Dy disposition; requesting to leave AMA; Options include outpatient treatment resources versus inpatient commitment. Disposition Disposition of Patient: Outpatient treatment;Other dispositions Type of inpatient treatment program: Adult Type of outpatient treatment:  (Given [place] and [facility] treatment information) Patient referred to:  (Declined at [facility]; Referred to [facility])  On Site Evaluation by:   Reviewed with Physician:     Angelica Ran 02/28/2012 1:32 PM

## 2012-02-28 NOTE — ED Notes (Signed)
SPOKE WITH ACT TEAM MEMBER ABOUT PATIENTS ASKING TO LEAVE.

## 2012-02-28 NOTE — ED Notes (Signed)
Pt received all belongs from locker.  Pt signed paper on receipt.  Pt gives verbal understanding that he is to return if any psychiatric problem returns.  Pt states "don't worry I will check back in.  I don't want to hurt myself.  I haven't felt like that since yesterday".

## 2012-02-28 NOTE — ED Notes (Signed)
Pt eating meal

## 2012-03-01 ENCOUNTER — Encounter (HOSPITAL_COMMUNITY): Payer: Self-pay | Admitting: *Deleted

## 2012-03-01 ENCOUNTER — Encounter (HOSPITAL_COMMUNITY): Payer: Self-pay | Admitting: Licensed Clinical Social Worker

## 2012-03-01 ENCOUNTER — Inpatient Hospital Stay (HOSPITAL_COMMUNITY)
Admission: RE | Admit: 2012-03-01 | Discharge: 2012-03-06 | DRG: 897 | Disposition: A | Discharge status: Home / Self Care | Payer: Federal, State, Local not specified - Other | Attending: Psychiatry | Admitting: Psychiatry

## 2012-03-01 ENCOUNTER — Emergency Department (HOSPITAL_COMMUNITY)
Admission: EM | Admit: 2012-03-01 | Discharge: 2012-03-01 | Disposition: A | Discharge status: Home / Self Care | Payer: Self-pay | Attending: Emergency Medicine | Admitting: Emergency Medicine

## 2012-03-01 DIAGNOSIS — Z046 Encounter for general psychiatric examination, requested by authority: Secondary | ICD-10-CM | POA: Insufficient documentation

## 2012-03-01 DIAGNOSIS — F39 Unspecified mood [affective] disorder: Secondary | ICD-10-CM | POA: Insufficient documentation

## 2012-03-01 DIAGNOSIS — J309 Allergic rhinitis, unspecified: Secondary | ICD-10-CM

## 2012-03-01 DIAGNOSIS — F17200 Nicotine dependence, unspecified, uncomplicated: Secondary | ICD-10-CM | POA: Insufficient documentation

## 2012-03-01 DIAGNOSIS — I10 Essential (primary) hypertension: Secondary | ICD-10-CM

## 2012-03-01 DIAGNOSIS — M5380 Other specified dorsopathies, site unspecified: Secondary | ICD-10-CM

## 2012-03-01 DIAGNOSIS — F1410 Cocaine abuse, uncomplicated: Secondary | ICD-10-CM | POA: Insufficient documentation

## 2012-03-01 DIAGNOSIS — F329 Major depressive disorder, single episode, unspecified: Secondary | ICD-10-CM | POA: Diagnosis present

## 2012-03-01 DIAGNOSIS — R45851 Suicidal ideations: Secondary | ICD-10-CM

## 2012-03-01 DIAGNOSIS — J329 Chronic sinusitis, unspecified: Secondary | ICD-10-CM

## 2012-03-01 DIAGNOSIS — Z794 Long term (current) use of insulin: Secondary | ICD-10-CM | POA: Insufficient documentation

## 2012-03-01 DIAGNOSIS — D72819 Decreased white blood cell count, unspecified: Secondary | ICD-10-CM

## 2012-03-01 DIAGNOSIS — E119 Type 2 diabetes mellitus without complications: Secondary | ICD-10-CM | POA: Insufficient documentation

## 2012-03-01 DIAGNOSIS — F1010 Alcohol abuse, uncomplicated: Secondary | ICD-10-CM | POA: Diagnosis present

## 2012-03-01 DIAGNOSIS — M5412 Radiculopathy, cervical region: Secondary | ICD-10-CM

## 2012-03-01 DIAGNOSIS — E7800 Pure hypercholesterolemia, unspecified: Secondary | ICD-10-CM

## 2012-03-01 DIAGNOSIS — L259 Unspecified contact dermatitis, unspecified cause: Secondary | ICD-10-CM

## 2012-03-01 DIAGNOSIS — F1420 Cocaine dependence, uncomplicated: Principal | ICD-10-CM | POA: Diagnosis present

## 2012-03-01 LAB — COMPREHENSIVE METABOLIC PANEL
AST: 18 U/L (ref 0–37)
Alkaline Phosphatase: 61 U/L (ref 39–117)
CO2: 28 mEq/L (ref 19–32)
Chloride: 93 mEq/L — ABNORMAL LOW (ref 96–112)
Creatinine, Ser: 0.83 mg/dL (ref 0.50–1.35)
GFR calc non Af Amer: >90 mL/min (ref >90)
Potassium: 4 mEq/L (ref 3.5–5.1)
Total Bilirubin: 0.3 mg/dL (ref 0.3–1.2)

## 2012-03-01 LAB — ACETAMINOPHEN LEVEL: Acetaminophen (Tylenol), Serum: <15 ug/mL (ref 10–30)

## 2012-03-01 LAB — CBC WITH DIFFERENTIAL/PLATELET
Basophils Absolute: 0 10*3/uL (ref 0.0–0.1)
Basophils Relative: 1 % (ref 0–1)
HCT: 39 % (ref 39.0–52.0)
Lymphocytes Relative: 28 % (ref 12–46)
MCHC: 35.6 g/dL (ref 30.0–36.0)
Neutro Abs: 2.8 10*3/uL (ref 1.7–7.7)
Neutrophils Relative %: 65 % (ref 43–77)
Platelets: 300 10*3/uL (ref 150–400)
RDW: 13.6 % (ref 11.5–15.5)
WBC: 4.3 10*3/uL (ref 4.0–10.5)

## 2012-03-01 LAB — RAPID URINE DRUG SCREEN, HOSP PERFORMED
Amphetamines: NOT DETECTED
Barbiturates: NOT DETECTED
Benzodiazepines: NOT DETECTED
Cocaine: POSITIVE — AB
Tetrahydrocannabinol: NOT DETECTED

## 2012-03-01 LAB — GLUCOSE, CAPILLARY
Glucose-Capillary: 240 mg/dL — ABNORMAL HIGH (ref 70–99)
Glucose-Capillary: 394 mg/dL — ABNORMAL HIGH (ref 70–99)

## 2012-03-01 MED ORDER — VITAMIN B-1 100 MG PO TABS
100.0000 mg | ORAL_TABLET | Freq: Every day | ORAL | Status: DC
  Administered 2012-03-02 – 2012-03-06 (×5): 100 mg via ORAL
  Filled 2012-03-01: qty 1
  Filled 2012-03-01: qty 14
  Filled 2012-03-01 (×3): qty 1
  Filled 2012-03-01: qty 14
  Filled 2012-03-01 (×2): qty 1

## 2012-03-01 MED ORDER — INSULIN ASPART PROT & ASPART (70-30 MIX) 100 UNIT/ML ~~LOC~~ SUSP: 16.0000 [IU] | Freq: Every day | SUBCUTANEOUS | Status: DC

## 2012-03-01 MED ORDER — LOPERAMIDE HCL 2 MG PO CAPS: 2.0000 mg | ORAL_CAPSULE | ORAL | Status: AC | PRN

## 2012-03-01 MED ORDER — ONDANSETRON 4 MG PO TBDP: 4.0000 mg | ORAL_TABLET | Freq: Four times a day (QID) | ORAL | Status: AC | PRN

## 2012-03-01 MED ORDER — THIAMINE HCL 100 MG/ML IJ SOLN
100.0000 mg | Freq: Once | INTRAMUSCULAR | Status: AC
  Administered 2012-03-01: 100 mg via INTRAMUSCULAR

## 2012-03-01 MED ORDER — INSULIN ASPART 100 UNIT/ML ~~LOC~~ SOLN
0.0000 [IU] | Freq: Every day | SUBCUTANEOUS | Status: DC
  Administered 2012-03-01: 5 [IU] via SUBCUTANEOUS
  Administered 2012-03-02: 2 [IU] via SUBCUTANEOUS

## 2012-03-01 MED ORDER — METFORMIN HCL 500 MG PO TABS
1000.0000 mg | ORAL_TABLET | Freq: Two times a day (BID) | ORAL | Status: DC
  Administered 2012-03-01 – 2012-03-06 (×10): 1000 mg via ORAL
  Filled 2012-03-01: qty 2
  Filled 2012-03-01: qty 56
  Filled 2012-03-01 (×7): qty 2
  Filled 2012-03-01: qty 56
  Filled 2012-03-01 (×3): qty 2
  Filled 2012-03-01 (×2): qty 56
  Filled 2012-03-01: qty 2

## 2012-03-01 MED ORDER — CHLORDIAZEPOXIDE HCL 25 MG PO CAPS
25.0000 mg | ORAL_CAPSULE | ORAL | Status: AC
  Administered 2012-03-03: 25 mg via ORAL
  Filled 2012-03-01 (×2): qty 1

## 2012-03-01 MED ORDER — CHLORDIAZEPOXIDE HCL 25 MG PO CAPS
50.0000 mg | ORAL_CAPSULE | Freq: Once | ORAL | Status: AC
  Administered 2012-03-01: 50 mg via ORAL
  Filled 2012-03-01: qty 2

## 2012-03-01 MED ORDER — CHLORDIAZEPOXIDE HCL 25 MG PO CAPS
25.0000 mg | ORAL_CAPSULE | Freq: Four times a day (QID) | ORAL | Status: AC
  Administered 2012-03-01 (×2): 25 mg via ORAL
  Filled 2012-03-01 (×2): qty 1

## 2012-03-01 MED ORDER — INSULIN ASPART 100 UNIT/ML ~~LOC~~ SOLN
0.0000 [IU] | Freq: Three times a day (TID) | SUBCUTANEOUS | Status: DC
  Administered 2012-03-02: 5 [IU] via SUBCUTANEOUS
  Administered 2012-03-02: 15 [IU] via SUBCUTANEOUS
  Administered 2012-03-02: 5 [IU] via SUBCUTANEOUS
  Administered 2012-03-03: 3 [IU] via SUBCUTANEOUS
  Administered 2012-03-03: 5 [IU] via SUBCUTANEOUS
  Administered 2012-03-03 – 2012-03-04 (×2): 8 [IU] via SUBCUTANEOUS
  Administered 2012-03-04: 5 [IU] via SUBCUTANEOUS
  Administered 2012-03-04: 11 [IU] via SUBCUTANEOUS
  Administered 2012-03-05 (×2): 8 [IU] via SUBCUTANEOUS
  Administered 2012-03-05: 5 [IU] via SUBCUTANEOUS
  Administered 2012-03-06 (×2): 3 [IU] via SUBCUTANEOUS

## 2012-03-01 MED ORDER — CHLORDIAZEPOXIDE HCL 25 MG PO CAPS: 25.0000 mg | ORAL_CAPSULE | Freq: Four times a day (QID) | ORAL | Status: AC | PRN

## 2012-03-01 MED ORDER — INSULIN ASPART 100 UNIT/ML ~~LOC~~ SOLN: 10.0000 [IU] | Freq: Every day | SUBCUTANEOUS | Status: DC

## 2012-03-01 MED ORDER — ADULT MULTIVITAMIN W/MINERALS CH
1.0000 | ORAL_TABLET | Freq: Every day | ORAL | Status: DC
  Administered 2012-03-01 – 2012-03-06 (×7): 1 via ORAL
  Filled 2012-03-01 (×2): qty 14
  Filled 2012-03-01 (×7): qty 1

## 2012-03-01 MED ORDER — INSULIN ASPART PROT & ASPART (70-30 MIX) 100 UNIT/ML ~~LOC~~ SUSP
6.0000 [IU] | Freq: Once | SUBCUTANEOUS | Status: AC
  Administered 2012-03-01: 6 [IU] via SUBCUTANEOUS

## 2012-03-01 MED ORDER — METFORMIN HCL 500 MG PO TABS
500.0000 mg | ORAL_TABLET | Freq: Once | ORAL | Status: AC
  Administered 2012-03-01: 500 mg via ORAL
  Filled 2012-03-01: qty 1

## 2012-03-01 MED ORDER — CHLORDIAZEPOXIDE HCL 25 MG PO CAPS
25.0000 mg | ORAL_CAPSULE | Freq: Every day | ORAL | Status: AC
  Administered 2012-03-04: 25 mg via ORAL

## 2012-03-01 MED ORDER — IBUPROFEN 600 MG PO TABS
600.0000 mg | ORAL_TABLET | Freq: Four times a day (QID) | ORAL | Status: DC | PRN
  Administered 2012-03-01 – 2012-03-02 (×4): 600 mg via ORAL
  Filled 2012-03-01 (×4): qty 1

## 2012-03-01 MED ORDER — INSULIN ASPART PROT & ASPART (70-30 MIX) 100 UNIT/ML ~~LOC~~ SUSP
16.0000 [IU] | Freq: Every day | SUBCUTANEOUS | Status: DC
  Administered 2012-03-02 – 2012-03-05 (×4): 16 [IU] via SUBCUTANEOUS

## 2012-03-01 MED ORDER — CHLORDIAZEPOXIDE HCL 25 MG PO CAPS
25.0000 mg | ORAL_CAPSULE | Freq: Three times a day (TID) | ORAL | Status: AC
  Administered 2012-03-02 (×2): 25 mg via ORAL
  Filled 2012-03-01 (×4): qty 1

## 2012-03-01 MED ORDER — INSULIN ASPART PROT & ASPART (70-30 MIX) 100 UNIT/ML ~~LOC~~ SUSP
10.0000 [IU] | Freq: Every day | SUBCUTANEOUS | Status: DC
  Administered 2012-03-01: 10 [IU] via SUBCUTANEOUS

## 2012-03-01 MED ORDER — TRAZODONE HCL 50 MG PO TABS
50.0000 mg | ORAL_TABLET | Freq: Every evening | ORAL | Status: DC | PRN
  Administered 2012-03-04 – 2012-03-05 (×2): 50 mg via ORAL
  Filled 2012-03-01 (×3): qty 1
  Filled 2012-03-01: qty 28

## 2012-03-01 MED ORDER — INSULIN ASPART 100 UNIT/ML ~~LOC~~ SOLN: 4.0000 [IU] | Freq: Three times a day (TID) | SUBCUTANEOUS | Status: DC

## 2012-03-01 MED ORDER — INSULIN ASPART 100 UNIT/ML ~~LOC~~ SOLN
6.0000 [IU] | Freq: Once | SUBCUTANEOUS | Status: AC
  Administered 2012-03-01: 6 [IU] via SUBCUTANEOUS
  Filled 2012-03-01: qty 1

## 2012-03-01 MED ORDER — HYDROXYZINE HCL 25 MG PO TABS
25.0000 mg | ORAL_TABLET | Freq: Four times a day (QID) | ORAL | Status: AC | PRN
  Administered 2012-03-01 – 2012-03-02 (×2): 25 mg via ORAL

## 2012-03-01 NOTE — [facility] Suicide Risk Assessment (Signed)
Suicide Risk Assessment  Admission Assessment     Nursing information obtained from:    Demographic factors:    Current Mental Status:    Loss Factors:    Historical Factors:    Risk Reduction Factors:     CLINICAL FACTORS:   Depression:   Comorbid alcohol abuse/dependence Alcohol/Substance Abuse/Dependencies  COGNITIVE FEATURES THAT CONTRIBUTE TO RISK: No evidence   SUICIDE RISK:   Moderate:  Frequent suicidal ideation with limited intensity, and duration, some specificity in terms of plans, no associated intent, good self-control, limited dysphoria/symptomatology, some risk factors present, and identifiable protective factors, including available and accessible social support.  PLAN OF CARE: Supportive approach/coping skills/relapse prevention                               Detox                               Re assess depression   James [name] A 03/01/2012, 2:55 PM

## 2012-03-01 NOTE — Progress Notes (Signed)
Admission Note  D: Patient very pleasant and cooperative with staff. Patient reported that he's very depressed and prior to admission had suicidal thoughts to harm self by taking a knife to cut his wrist or the back of his neck, but stopped himself and threw the knife over the bridge. Patient reported that the last time he drank alcohol and used crack/cocaine was around 2:00 am; he reported that the longest he's been sober is 6 years.  A: Support and encouragement provided to patient. Oriented patient to the unit. Informed patient of unit rules. Initiated 15 minute checks.   R: Patient receptive. Denies SI/HI/AVH at this time. Patient remains safe.

## 2012-03-01 NOTE — ED Provider Notes (Signed)
History     CSN: [id number]  Arrival date & time 03/01/12  0135   First MD Initiated Contact with Patient 03/01/12 0215      Chief Complaint  Patient presents with  . Medical Clearance    'Pt states he is suicidal    (Consider location/radiation/quality/duration/timing/severity/associated sxs/prior treatment) HPI History provided by patient. Here with 2 complaints. His main complaint is requesting detox from cocaine. Last use was prior to arrival. Has history of cocaine abuse. Has been stealing money from his wife to buy cocaine and now has been kicked out and is homeless. Second complaint is suicidal ideation. He states tonight he was considering cutting his wrists but decided not to. No self injury. Patient was evaluated yesterday for the same at [facility]. He had psychiatric evaluation with to psychiatry and was felt to be stable for discharge home with outpatient followup and referrals. Tonight patient is using cocaine again and again feeling suicidal. Moderate in severity. Past Medical History  Diagnosis Date  . Diabetes mellitus without complication     Past Surgical History  Procedure Date  . Finger surgery     History reviewed. No pertinent family history.  History  Substance Use Topics  . Smoking status: Current Every Day Smoker -- 1.0 packs/day  . Smokeless tobacco: Not on file  . Alcohol Use: Yes      Review of Systems  Constitutional: Negative for fever and chills.  HENT: Negative for neck pain and neck stiffness.   Eyes: Negative for pain.  Respiratory: Negative for shortness of breath.   Cardiovascular: Negative for chest pain.  Gastrointestinal: Negative for abdominal pain.  Genitourinary: Negative for dysuria.  Musculoskeletal: Negative for back pain.  Skin: Negative for rash.  Neurological: Negative for headaches.  All other systems reviewed and are negative.    Allergies  Review of patient's allergies indicates no known allergies.  Home  Medications   Current Outpatient Rx  Name  Route  Sig  Dispense  Refill  . INSULIN ASPART PROT & ASPART (70-30) 100 UNIT/ML Tye SUSP   Subcutaneous   Inject 10 Units into the skin daily with supper.         Marland Kitchen METFORMIN HCL 500 MG PO TABS   Oral   Take 500 mg by mouth 2 (two) times daily with a meal.           BP 128/72  Pulse 103  Temp 98.4 F (36.9 C) (Oral)  Resp 18  Ht 6' (1.829 m)  Wt 160 lb (72.576 kg)  BMI 21.70 kg/m2  SpO2 96%  Physical Exam  Constitutional: He is oriented to person, place, and time. He appears well-developed and well-nourished.  HENT:  Head: Normocephalic and atraumatic.  Eyes: EOM are normal. Pupils are equal, round, and reactive to light.  Neck: Neck supple.  Cardiovascular: Normal rate, regular rhythm and intact distal pulses.   Pulmonary/Chest: Effort normal and breath sounds normal. No respiratory distress.  Musculoskeletal: Normal range of motion. He exhibits no edema.       No extremity lacerations or evidence of injury.  Neurological: He is alert and oriented to person, place, and time.  Skin: Skin is warm and dry.  Psychiatric:       Depressed affect    ED Course  Procedures (including critical care time)  Results for orders placed during the hospital encounter of 03/01/12  COMPREHENSIVE METABOLIC PANEL      Component Value Range   Sodium 132 (*) 135 -  145 mEq/L   Potassium 4.0  3.5 - 5.1 mEq/L   Chloride 93 (*) 96 - 112 mEq/L   CO2 28  19 - 32 mEq/L   Glucose, Bld 322 (*) 70 - 99 mg/dL   BUN 10  6 - 23 mg/dL   Creatinine, Ser 1.61  0.50 - 1.35 mg/dL   Calcium 9.8  8.4 - 09.6 mg/dL   Total Protein 7.1  6.0 - 8.3 g/dL   Albumin 3.9  3.5 - 5.2 g/dL   AST 18  0 - 37 U/L   ALT 13  0 - 53 U/L   Alkaline Phosphatase 61  39 - 117 U/L   Total Bilirubin 0.3  0.3 - 1.2 mg/dL   GFR calc non Af Amer >90  >90 mL/min   GFR calc Af Amer >90  >90 mL/min  ETHANOL      Component Value Range   Alcohol, Ethyl (B) <11  0 - 11 mg/dL    ACETAMINOPHEN LEVEL      Component Value Range   Acetaminophen (Tylenol), Serum <15.0  10 - 30 ug/mL  SALICYLATE LEVEL      Component Value Range   Salicylate Lvl <2.0 (*) 2.8 - 20.0 mg/dL  URINE RAPID DRUG SCREEN (HOSP PERFORMED)      Component Value Range   Opiates NONE DETECTED  NONE DETECTED   Cocaine POSITIVE (*) NONE DETECTED   Benzodiazepines NONE DETECTED  NONE DETECTED   Amphetamines NONE DETECTED  NONE DETECTED   Tetrahydrocannabinol NONE DETECTED  NONE DETECTED   Barbiturates NONE DETECTED  NONE DETECTED  CBC WITH DIFFERENTIAL      Component Value Range   WBC 4.3  4.0 - 10.5 K/uL   RBC 4.70  4.22 - 5.81 MIL/uL   Hemoglobin 13.9  13.0 - 17.0 g/dL   HCT 04.5  40.9 - 81.1 %   MCV 83.0  78.0 - 100.0 fL   MCH 29.6  26.0 - 34.0 pg   MCHC 35.6  30.0 - 36.0 g/dL   RDW 91.4  78.2 - 95.6 %   Platelets 300  150 - 400 K/uL   Neutrophils Relative 65  43 - 77 %   Neutro Abs 2.8  1.7 - 7.7 K/uL   Lymphocytes Relative 28  12 - 46 %   Lymphs Abs 1.2  0.7 - 4.0 K/uL   Monocytes Relative 6  3 - 12 %   Monocytes Absolute 0.2  0.1 - 1.0 K/uL   Eosinophils Relative 1  0 - 5 %   Eosinophils Absolute 0.1  0.0 - 0.7 K/uL   Basophils Relative 1  0 - 1 %   Basophils Absolute 0.0  0.0 - 0.1 K/uL    ACT consult for evaluation, recs telepsych.  Dr Jacky Kindle evalauated and recs OK for d/c and outpatient follow up. ACT provided referrals and resources.   MDM   Cocaine abuse, homelessness and suicidal ideation.  Has had 2 psychiatric consultations last 48 hours and both times recommended okay for discharge from the emergency department. Patient continues to abuse cocaine. Labs and urinalysis reviewed as above. Psych consult as above. Vital signs, nursing notes and old records reviewed.        Sunnie Nielsen, MD 03/01/12 [phone]

## 2012-03-01 NOTE — ED Notes (Signed)
Pt states he has pawn receipts in his wallet and he called his wife James [name] to come pick up so she can get her things out of pawn that he pawned for drugs,  If she is unable to come his son James [name] will come to pick up receipts which are in his wallet.

## 2012-03-01 NOTE — Progress Notes (Signed)
Psychoeducational Group Note  Date:  03/01/2012 Time:  2000  Group Topic/Focus:  AA group  Participation Level:  Active  Participation Quality:  Appropriate  Affect:  Appropriate  Cognitive:  Appropriate  Insight:  Good  Engagement in Group:  Good  Additional Comments:    James [name] Patience 03/01/2012, 9:55 PM

## 2012-03-01 NOTE — Tx Team (Signed)
Initial Interdisciplinary Treatment Plan  PATIENT STRENGTHS: (choose at least two) Ability for insight Capable of independent living Motivation for treatment/growth  PATIENT STRESSORS: Health problems Marital or family conflict Substance abuse   PROBLEM LIST: Problem List/Patient Goals Date to be addressed Date deferred Reason deferred Estimated date of resolution  Substance Abuse      Suicidal Ideation                                                 DISCHARGE CRITERIA:  Ability to meet basic life and health needs Adequate post-discharge living arrangements Improved stabilization in mood, thinking, and/or behavior Medical problems require only outpatient monitoring  PRELIMINARY DISCHARGE PLAN: Attend aftercare/continuing care group Outpatient therapy Return to previous living arrangement  PATIENT/FAMIILY INVOLVEMENT: This treatment plan has been presented to and reviewed with the patient, James [name].  The patient and family have been given the opportunity to ask questions and make suggestions.  Harold Barban E 03/01/2012, 11:38 AM

## 2012-03-01 NOTE — ED Notes (Signed)
Family at bedside. 

## 2012-03-01 NOTE — ED Notes (Signed)
Pt called EMS bc he was going to "cut his wrist" and just couldn't do,  He wants help,  He is very depressed and afraid of what he might do to himself because he has lost everything due to his drug addiction "cocaine",  Pt denies homicidal thoughts he is tearful during examination. Pt is also homeless at this time his wife kicked him out he said because he stole from her to get drug money

## 2012-03-01 NOTE — Progress Notes (Addendum)
Nutrition Brief Note  Patient identified on the Malnutrition Screening Tool (MST) Report  Current wt: 160 lbs.  Pt estimates he has lost 28 lbs over the past year to due to drug abuse.   Current diet order is Regular per RN (no diet in chart due to recent admission). Labs and medications reviewed.   Pt states his desire to improve self care including getting his diabetes under control.  Pt states limited access to medications including insulin and inconsistent use because of this.  Left handouts in pt's discharge pt per his preference.  No nutrition interventions warranted at this time. If nutrition issues arise, please consult RD.   Loyce Dys, MS RD LDN Clinical Inpatient Dietitian Pager: [phone] Weekend/After hours pager: [phone]

## 2012-03-01 NOTE — ED Notes (Signed)
Warm blankets provided.

## 2012-03-01 NOTE — Progress Notes (Signed)
Patient ID: James [name], male   DOB: 10-25-1960, 50 y.o.   MRN: [id number] He has been up and to part of the groups interacting with peers and staff.  Denies withdrawal symptoms.

## 2012-03-01 NOTE — Progress Notes (Addendum)
Patient in bed asleep during my round. Although the evening nurse reported that patient's CBG was very high and he had to give 5 unit of Novolog and 6 unit of 70/30. Writer notified the tech on the hall to recheck the CB after at 1200 am. Q 15 minute check continues as ordered to maintain safety.  0025: Patient's CBG was re checked. It went down form 369 to 285. Writer notified [place], [place] . He said we should recheck it in the morning at 0600 am. Q 15 minute check continues to maintain safety.

## 2012-03-01 NOTE — BH Assessment (Signed)
Assessment Note   James [name] is an 51 y.o. male, married, African-American who walked to [facility] requesting treatment for suicidal ideation, cocaine dependence and alcohol abuse. Pt reports he was at [facility] 3 days ago requesting substance abuse treatment and he was stabilized and discharged with outpatient referrals. He reports he never even made it home before relapsing on cocaine and alcohol. He became suicidal and put a knife to his throat to kill himself. He called Patent examiner and they transported him to [facility]. He reports at [facility] he was seen by tele-psychiatry and that the psychiatrist "humiliated me" and said he was manipulating and not sincere with seeking treatment. Pt reports he became so upset that he banged his head against the glass and states staff at [facility] "laughed at me... They didn't care... I had never been so disrespected, so I just wanted to leave." He was discharged and once again felt suicidal and unsafe so he called [facility] and was instructed to come for assessment.  Pt reports continued suicidal ideation with a plan to cut his throat. He cannot contract for safety outside the hospital. He reports using $100-200 worth of crack daily and drinking approximately 1 pint of liquor daily. He says he has been stealing from his wife and pawning possessions and cannot return home. He is homeless and unemployed. He states he has been unemployed for over 2 years because he has a history of a felony drug conviction and no one will hire him. He has no current support or resources. He report depressive symptoms including crying spells, poor sleep, poor appetite with weight loss and feelings of hopelessness and helplessness. He denies homicidal ideation or a history of violence. He denies psychotic symptoms. He denies any current legal problems or pending court dates. He denies using substances other than cocaine and alcohol.   Pt was tearful throughout assessment, sobbing at times. He states he  knows he cannot stop using drugs on his own and wants to eventually go to residential treatment.   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 Diabetes mellitus without complication    Axis IV: economic problems, housing problems, occupational problems, problems related to social environment, problems with access to health care services and problems with primary support group Axis V: GAF = 35  Past Medical History:  Past Medical History  Diagnosis Date  . Diabetes mellitus without complication     Past Surgical History  Procedure Date  . Finger surgery     Family History: No family history on file.  Social History:  reports that he has been smoking.  He does not have any smokeless tobacco history on file. He reports that he drinks about 30 ounces of alcohol per week. He reports that he uses illicit drugs (Cocaine) about 5 times per week.  Additional Social History:  Alcohol / Drug Use Pain Medications: Denies Prescriptions: Denies Over the Counter: Denies History of alcohol / drug use?: Yes Longest period of sobriety (when/how long): 6 years, 2005-2011 Negative Consequences of Use: Financial;Legal;Personal relationships Substance #1 Name of Substance 1: Crack Cocaine 1 - Age of First Use: 41 1 - Amount (size/oz): $100-200 worth 1 - Frequency: Daily 1 - Duration: Ongoing for 8 months 1 - Last Use / Amount: 02/29/2012, unknown amount Substance #2 Name of Substance 2: Alcohol 2 - Age of First Use: teen 2 - Amount (size/oz):  Approximately 1 pint liquor 2 - Frequency: Daily 2 - Duration: Ongoing 2 - Last Use / Amount: 02/29/2012, 1/2 pint liquor  CIWA: CIWA-Ar Nausea and Vomiting: no nausea and no vomiting Tactile Disturbances: none Tremor: no tremor Auditory Disturbances: not present Paroxysmal Sweats: barely perceptible sweating, palms moist Visual  Disturbances: not present Anxiety: three Headache, Fullness in Head: none present Agitation: somewhat more than normal activity Orientation and Clouding of Sensorium: oriented and can do serial additions CIWA-Ar Total: 5  COWS:    Allergies: No Known Allergies  Home Medications:  Medications Prior to Admission  Medication Sig Dispense Refill  . insulin aspart protamine-insulin aspart (NOVOLOG 70/30) (70-30) 100 UNIT/ML injection Inject 10 Units into the skin daily with supper.      . metFORMIN (GLUCOPHAGE) 500 MG tablet Take 500 mg by mouth 2 (two) times daily with a meal.        OB/GYN Status:  No LMP for male patient.  General Assessment Data Location of Assessment: [facility] Living Arrangements: Other (Comment) (Homeless) Can pt return to current living arrangement?: No Admission Status: Voluntary Is patient capable of signing voluntary admission?: Yes Transfer from: Other (Comment) (Walk-in from street) Referral Source: Self/Family/Friend  Education Status Is patient currently in school?: No Current Grade: NA Highest grade of school patient has completed: NA Name of school: NA Contact person: NA  Risk to self Suicidal Ideation: Yes-Currently Present Suicidal Intent: Yes-Currently Present Is patient at risk for suicide?: Yes Suicidal Plan?: Yes-Currently Present Specify Current Suicidal Plan: Plan to cut his throat with a knife Access to Means: Yes Specify Access to Suicidal Means: Pt reports he had a knife today to his throat What has been your use of drugs/alcohol within the last 12 months?: Pt has been using cocaine and alcohol daily Previous Attempts/Gestures: No How many times?: 0  Other Self Harm Risks: None Triggers for Past Attempts: Spouse contact;Other personal contacts;Unpredictable Intentional Self Injurious Behavior: None Comment - Self Injurious Behavior: Pt denies Family Suicide History: No Recent stressful life event(s): Conflict  (Comment);Job Loss;Financial Problems;Other (Comment) (Homeless, stole from wife) Persecutory voices/beliefs?: No Depression: Yes Depression Symptoms: Despondent;Tearfulness;Fatigue;Guilt;Loss of interest in usual pleasures;Feeling worthless/self pity Substance abuse history and/or treatment for substance abuse?: Yes Suicide prevention information given to non-admitted patients: Not applicable  Risk to Others Homicidal Ideation: No Thoughts of Harm to Others: No Current Homicidal Intent: No Current Homicidal Plan: No Access to Homicidal Means: No Identified Victim: None History of harm to others?: No Assessment of Violence: None Noted Violent Behavior Description: Pt denies history of violence Does patient have access to weapons?: No Criminal Charges Pending?: No (Pt has a history of felony drug conviction. No current charg) Does patient have a court date: No  Psychosis Hallucinations: None noted Delusions: None noted  Mental Status Report Appear/Hygiene: Disheveled;Poor hygiene Eye Contact: Fair Motor Activity: Unremarkable Speech: Logical/coherent Level of Consciousness: Crying;Alert Mood: Depressed;Anxious;Helpless;Ashamed/humiliated;Sad Affect: Depressed;Sad Anxiety Level: Minimal Thought Processes: Coherent;Relevant Judgement: Impaired Orientation: Person;Place;Time;Situation Obsessive Compulsive Thoughts/Behaviors: None  Cognitive Functioning Concentration: Normal Memory: Recent Intact;Remote Intact IQ: Average Insight: Poor Impulse Control: Poor Appetite: Poor Weight Loss: 0  Weight Gain: 0  Sleep: Decreased Total Hours of Sleep: 4  Vegetative Symptoms: Decreased grooming  ADLScreening [facility]) Patient's cognitive ability adequate to safely complete daily activities?: Yes Patient able to express need for assistance with ADLs?: Yes Independently performs ADLs?: Yes (appropriate for developmental age)  Abuse/Neglect [facility]) Physical Abuse:  Denies Verbal Abuse: Denies Sexual Abuse: Denies  Prior Inpatient Therapy Prior Inpatient Therapy: Yes Prior Therapy Dates: 2012 Prior Therapy Facilty/Provider(s): [facility] Reason for Treatment: Cocaine dependence  Prior Outpatient Therapy Prior Outpatient Therapy: Yes Prior Therapy Dates: 2012 Prior Therapy Facilty/Provider(s): [facility] Reason for Treatment: Substance abuse  ADL Screening (condition at time of admission) Patient's cognitive ability adequate to safely complete daily activities?: Yes Patient able to express need for assistance with ADLs?: Yes Independently performs ADLs?: Yes (appropriate for developmental age) Weakness of Legs: None Weakness of Arms/Hands: None  Home Assistive Devices/Equipment Home Assistive Devices/Equipment: None    Abuse/Neglect Assessment (Assessment to be complete while patient is alone) Physical Abuse: Denies Verbal Abuse: Denies Sexual Abuse: Denies Exploitation of patient/patient's resources: Denies Self-Neglect: Denies     Merchant navy officer (For Healthcare) Advance Directive: Patient does not have advance directive;Patient would not like information Pre-existing out of facility DNR order (yellow form or pink MOST form): No Nutrition Screen- [facility] Adult/[facility]/AP Patient's home diet: Carb modified Have you recently lost weight without trying?: No Have you been eating poorly because of a decreased appetite?: No Malnutrition Screening Tool Score: 0   Additional Information 1:1 In Past 12 Months?: No CIRT Risk: No Elopement Risk: No Does patient have medical clearance?: Yes     Disposition:  Disposition Disposition of Patient: Inpatient treatment program Type of inpatient treatment program: Adult  On Site Evaluation by:   Reviewed with Physician: Nanine Means, NP    Patsy Baltimore, Harlin Rain 03/01/2012 9:58 AM

## 2012-03-01 NOTE — Clinical Social Work Note (Signed)
[facility] Group Note : Clinical Social Worker Group Therapy  03/01/2012  1:15 PM  Type of Therapy:  Group Therapy - Process Group  Participation Level:  Appropriate  Participation Quality:  Appropriate   Affect:  Appropriate, Agitated  Cognitive:  Alert  Insight:  Good  Engagement in Group:  Good  Engagement in Therapy:  Good  Modes of Intervention:  Clarification, Education, Problem-solving, Socialization and Support  Summary of Progress/Problems: The topic for group today was emotional regulation.  Pt discussed being upset from being humiliated at the ED.  Pt discussed feelings of disappointment in relapsing again before the holidays.  Pt was supported in his feelings of humiliation in asking for help and disappointment in self.     Niko Jakel Horton, LCSWA 03/01/2012 3:00 pm

## 2012-03-01 NOTE — ED Notes (Signed)
Terri with [facility] team in room talking with pt

## 2012-03-01 NOTE — BH Assessment (Signed)
Assessment Note   James [name] is a 51 y.o. male who presents to SI/SA/Depression.  Pt was assessed [facility] on 11/18 for SA/SI/depression.  Pt requested to leave AMA and was given referrals for rehab.  Pt returns to [facility] after making a SI gesture.  Pt says while he on the streets, he had a knife and held it to his throat and then wrist but wasn't able to go through with killing self--"I don't trust myself right now".   Pt says he saw a Emergency planning/management officer, told him what was going on and he was escorted(voluntarily) to the hospital.  Pt says he wants help with cocaine addiction, pt was informed that referrals could be provided again as detox is only available with the hospital.  Pt then told this writer that he drinks 1/5 or 1 pint of alcohol daily and needs help with detox.  Pt has no alcohol present in his UDS.  Pt is also now homeless because kicked him out of the home because he's been stealing from spouse to support his drug habit.  Telepsych is in progress for final disposition.    Axis I: Depressive Disorder NOS and Cocaine Dep  Axis II: Deferred Axis III:  Past Medical History  Diagnosis Date  . Diabetes mellitus without complication    Axis IV: other psychosocial or environmental problems, problems related to social environment and problems with primary support group Axis V: 41-50 serious symptoms  Past Medical History:  Past Medical History  Diagnosis Date  . Diabetes mellitus without complication     Past Surgical History  Procedure Date  . Finger surgery     Family History: History reviewed. No pertinent family history.  Social History:  reports that he has been smoking.  He does not have any smokeless tobacco history on file. He reports that he drinks alcohol. He reports that he uses illicit drugs (Cocaine) about 5 times per week.  Additional Social History:  Alcohol / Drug Use Pain Medications: See MAR  Prescriptions: See MAR  Over the Counter: See MAR  History  of alcohol / drug use?: Yes Substance #2 Name of Substance 2: Cocaine/Crack  2 - Age of First Use: 35 YOM  2 - Amount (size/oz): $100-$200 2 - Frequency: Daily  2 - Duration: On-going  2 - Last Use / Amount: 02/29/12  CIWA: CIWA-Ar BP: 118/71 mmHg Pulse Rate: 93  COWS:    Allergies: No Known Allergies  Home Medications:  (Not in a hospital admission)  OB/GYN Status:  No LMP for male patient.  General Assessment Data Location of Assessment: [facility] Living Arrangements: Other (Comment) (Homeless--spouse asked pt to leave home ) Can pt return to current living arrangement?: No Admission Status: Voluntary Is patient capable of signing voluntary admission?: Yes Transfer from: Acute Hospital Referral Source: MD  Education Status Is patient currently in school?: No Current Grade: None  Highest grade of school patient has completed: None  Name of school: None  Contact person: None   Risk to self Suicidal Ideation: Yes-Currently Present Suicidal Intent: Yes-Currently Present Is patient at risk for suicide?: Yes Suicidal Plan?: Yes-Currently Present Specify Current Suicidal Plan: Pt says he has knife in possession and held it to throat/wrist  Access to Means: Yes Specify Access to Suicidal Means: Sharps/Knives  What has been your use of drugs/alcohol within the last 12 months?: Abusing: Cocaine/Crack, states he uses alcohol  Previous Attempts/Gestures: No How many times?: 0  Other Self Harm Risks:  None  Triggers for Past Attempts: Spouse contact;Other personal contacts;Unpredictable Intentional Self Injurious Behavior: None Comment - Self Injurious Behavior: None  Family Suicide History: No Recent stressful life event(s): Conflict (Comment);Turmoil (Comment) (Pt was asked by spouse to leave their home ) Persecutory voices/beliefs?: No Depression: Yes Depression Symptoms: Loss of interest in usual pleasures;Feeling worthless/self pity;Guilt;Despondent Substance abuse  history and/or treatment for substance abuse?: Yes Suicide prevention information given to non-admitted patients: Not applicable  Risk to Others Homicidal Ideation: No Thoughts of Harm to Others: No Current Homicidal Intent: No Current Homicidal Plan: No Access to Homicidal Means: No Identified Victim: None  History of harm to others?: No Assessment of Violence: None Noted Violent Behavior Description: None  Does patient have access to weapons?: No Criminal Charges Pending?: No Does patient have a court date: No  Psychosis Hallucinations: None noted Delusions: None noted  Mental Status Report Appear/Hygiene: Disheveled;Poor hygiene Eye Contact: Fair Motor Activity: Unremarkable Speech: Logical/coherent Level of Consciousness: Alert Mood: Depressed;Sad Affect: Depressed;Sad Anxiety Level: None Thought Processes: Coherent;Relevant Judgement: Impaired Orientation: Person;Place;Time;Situation Obsessive Compulsive Thoughts/Behaviors: None  Cognitive Functioning Concentration: Normal Memory: Recent Intact;Remote Intact IQ: Average Insight: Poor Impulse Control: Poor Appetite: Good Weight Loss:  (Unk ) Weight Gain: 0  Sleep: Decreased Total Hours of Sleep:  (No sleep in several days ) Vegetative Symptoms: None  ADLScreening [facility] Assessment Services) Patient's cognitive ability adequate to safely complete daily activities?: Yes Patient able to express need for assistance with ADLs?: Yes Independently performs ADLs?: Yes (appropriate for developmental age)  Abuse/Neglect [facility]) Physical Abuse: Denies Verbal Abuse: Denies Sexual Abuse: Denies  Prior Inpatient Therapy Prior Inpatient Therapy: Yes Prior Therapy Dates: 2012 Prior Therapy Facilty/Provider(s): [facility]  Reason for Treatment: SA  Prior Outpatient Therapy Prior Outpatient Therapy: No Prior Therapy Dates: None  Prior Therapy Facilty/Provider(s): None  Reason for Treatment: None   ADL Screening (condition  at time of admission) Patient's cognitive ability adequate to safely complete daily activities?: Yes Patient able to express need for assistance with ADLs?: Yes Independently performs ADLs?: Yes (appropriate for developmental age) Weakness of Legs: None Weakness of Arms/Hands: None  Home Assistive Devices/Equipment Home Assistive Devices/Equipment: None  Therapy Consults (therapy consults require a physician order) PT Evaluation Needed: No OT Evalulation Needed: No SLP Evaluation Needed: No Abuse/Neglect Assessment (Assessment to be complete while patient is alone) Physical Abuse: Denies Verbal Abuse: Denies Sexual Abuse: Denies Exploitation of patient/patient's resources: Denies Self-Neglect: Denies Values / Beliefs Cultural Requests During Hospitalization: None Spiritual Requests During Hospitalization: None Consults Spiritual Care Consult Needed: No Social Work Consult Needed: No Merchant navy officer (For Healthcare) Advance Directive: Patient does not have advance directive;Patient would not like information Pre-existing out of facility DNR order (yellow form or pink MOST form): No Nutrition Screen- [facility] Adult/[facility]/AP Patient's home diet: Regular Have you recently lost weight without trying?: No Have you been eating poorly because of a decreased appetite?: No Malnutrition Screening Tool Score: 0   Additional Information 1:1 In Past 12 Months?: No CIRT Risk: No Elopement Risk: No Does patient have medical clearance?: Yes     Disposition:  Disposition Disposition of Patient: Referred to (Telepsych ) Type of inpatient treatment program:  (None ) Type of outpatient treatment:  (None ) Patient referred to: Other (Comment) (Telepsych )  On Site Evaluation by:   Reviewed with Physician:     Murrell Redden 03/01/2012 5:59 AM

## 2012-03-01 NOTE — ED Notes (Signed)
Pt transferred from Main ED, SI presents with plan to cut his wrists & put knife to throat.  States his wife kicked him out of house.  Pt sold wife's jewelry to purchase crack cocaine.  Admits to using crack all day yesterday.  Drinks a pint of liquor per day.  Pt states he is tired of hurting family and needs help.  Feeling hopeless and depressed.

## 2012-03-01 NOTE — H&P (Signed)
Psychiatric Admission Assessment Adult  Patient Identification:  James [name] Date of Evaluation:  03/01/2012 Chief Complaint:  Depressive Disorder Cocaine Dependence Alcohol dependence History of Present Illness:: Came to the ED stating that  he was suicidal. He had been at the ED at [facility] He was told he was declined to come to [facility]. He started feeling better Monday night. He was going to [place]. He asked to be D/C. He left [facility], took a ride with some people who had drugs. He used. He got upset he took a knife and was going to hurt himself. Changed his mind Told a police what he was trying to do. He was taken to [facility]. He was told by the tele psychiatrist that he was manipulating, that he was going to be discharged. He banged his head against the wall. He was discharged. He found his way back to [facility]. Father died 02/11/12things got really bad for him Mood Symptoms:  Depression, Energy, Guilt, Helplessness, Hopelessness, Psychomotor Retardation, Sadness, SI, Sleep, Worthlessness, Depression Symptoms:  anhedonia, insomnia, psychomotor retardation, fatigue, feelings of worthlessness/guilt, difficulty concentrating, hopelessness, impaired memory, suicidal thoughts with specific plan, anxiety, insomnia, loss of energy/fatigue, disturbed sleep, weight loss, (Hypo) Manic Symptoms:  None Anxiety Symptoms:  Excessive Worry, Psychotic Symptoms:  Denies  PTSD Symptoms: Denies  Past Psychiatric History: Diagnosis:Cocaine Dependence, Alcohol Dependence, Substance Induced Mood Disorder  Hospitalizations: [facility] a year ago  Outpatient Care:  Substance Abuse Care: [facility] at  Least 90 days. Abstinent for 6 years  Self-Mutilation: Denies  Suicidal Attempts: Denies  Violent Behaviors:Denies   Past Medical History:   Past Medical History  Diagnosis Date  . Diabetes mellitus without complication    None. Allergies:  No Known Allergies PTA  Medications: Prescriptions prior to admission  Medication Sig Dispense Refill  . insulin aspart protamine-insulin aspart (NOVOLOG 70/30) (70-30) 100 UNIT/ML injection Inject 10 Units into the skin daily with supper.      . metFORMIN (GLUCOPHAGE) 500 MG tablet Take 500 mg by mouth 2 (two) times daily with a meal.        Previous Psychotropic Medications:  Medication/Dose  Denies               Substance Abuse History in the last 12 months: Substance Age of 1st Use Last Use Amount Specific Type  Nicotine 12  pack   Alcohol 17 One AM Pint every  Other day Vodka  Cannabis  tried    Opiates      Cocaine 16, 10 years ago crack One AM    Methamphetamines      LSD      Ecstasy      Benzodiazepines      Caffeine      Inhalants      Others:                         Consequences of Substance Abuse: Legal Consequences:  Drug charges. most 28 days  Social History: Current Place of Residence:   Place of Birth:   Family Members: Marital Status:  Married Children:  Sons:One  Daughters:Two Relationships: Education:  HS Print production planner Problems/Performance: unemployed Religious Beliefs/Practices: Denies History of Abuse (Emotional/Phsycial/Sexual) Occupational Experiences; Driving jobs, Financial risk analyst, Systems developer, Forensic psychologist for 5 years Military History:  None. Legal History: assault with deadly weapon ( out of probation 18 months) Hobbies/Interests:  Family History:  History reviewed. No pertinent family history.  Mental Status Examination/Evaluation:  Objective:  Appearance: Disheveled  Eye Contact::  Minimal  Speech:  Slow  Volume:  Decreased  Mood:  Depressed  Affect:  Restricted  Thought Process:  Coherent and Goal Directed  Orientation:  Full  Thought Content:  WDL  Suicidal Thoughts:  Yes.  with intent/plan  Homicidal Thoughts:  No  Memory:  Immediate;   Fair Recent;   Fair Remote;   Fair  Judgement:  Fair  Insight:  Present  Psychomotor Activity:  Decreased   Concentration:  Fair  Recall:  Fair  Akathisia:  No  Handed:  Right  AIMS (if indicated):     Assets:  Desire for Improvement Social Support  Sleep:       Laboratory/X-Ray Psychological Evaluation(s)      Assessment:    AXIS I:  Cocaine Dependence, Alcohol Abuse, Major depression AXIS II:  Deferred AXIS III:   Past Medical History  Diagnosis Date  . Diabetes mellitus without complication    AXIS IV:  economic problems and occupational problems AXIS V:  41-50 serious symptoms  Treatment Plan/Recommendations:  Treatment Plan Summary: Daily contact with patient to assess and evaluate symptoms and progress in treatment Medication management Current Medications:  Current Facility-Administered Medications  Medication Dose Route Frequency Provider Last Rate Last Dose  . chlordiazePOXIDE (LIBRIUM) capsule 25 mg  25 mg Oral Q6H PRN Nanine Means, NP      . chlordiazePOXIDE (LIBRIUM) capsule 25 mg  25 mg Oral QID Nanine Means, NP       Followed by  . chlordiazePOXIDE (LIBRIUM) capsule 25 mg  25 mg Oral TID Nanine Means, NP       Followed by  . chlordiazePOXIDE (LIBRIUM) capsule 25 mg  25 mg Oral BH-qamhs Nanine Means, NP       Followed by  . chlordiazePOXIDE (LIBRIUM) capsule 25 mg  25 mg Oral Daily Nanine Means, NP      . [COMPLETED] chlordiazePOXIDE (LIBRIUM) capsule 50 mg  50 mg Oral Once Nanine Means, NP   50 mg at 03/01/12 1147  . hydrOXYzine (ATARAX/VISTARIL) tablet 25 mg  25 mg Oral Q6H PRN Nanine Means, NP      . ibuprofen (ADVIL,MOTRIN) tablet 600 mg  600 mg Oral Q6H PRN Nanine Means, NP      . insulin aspart protamine-insulin aspart (NOVOLOG 70/30) injection 10 Units  10 Units Subcutaneous Q supper Nanine Means, NP      . loperamide (IMODIUM) capsule 2-4 mg  2-4 mg Oral PRN Nanine Means, NP      . metFORMIN (GLUCOPHAGE) tablet 1,000 mg  1,000 mg Oral BID WC Nanine Means, NP      . [COMPLETED] metFORMIN (GLUCOPHAGE) tablet 500 mg  500 mg Oral Once Nanine Means, NP   500  mg at 03/01/12 1200  . multivitamin with minerals tablet 1 tablet  1 tablet Oral Daily Nanine Means, NP   1 tablet at 03/01/12 1147  . ondansetron (ZOFRAN-ODT) disintegrating tablet 4 mg  4 mg Oral Q6H PRN Nanine Means, NP      . thiamine (B-1) injection 100 mg  100 mg Intramuscular Once Nanine Means, NP      . thiamine (VITAMIN B-1) tablet 100 mg  100 mg Oral Daily Nanine Means, NP      . traZODone (DESYREL) tablet 50 mg  50 mg Oral QHS PRN Nanine Means, NP      . [DISCONTINUED] insulin aspart (novoLOG) injection 10 Units  10 Units Subcutaneous Q1200 Nanine Means, NP  Facility-Administered Medications Ordered in Other Encounters  Medication Dose Route Frequency Provider Last Rate Last Dose  . [COMPLETED] insulin aspart (novoLOG) injection 6 Units  6 Units Subcutaneous Once Sunnie Nielsen, MD   6 Units at 03/01/12 0448    Observation Level/Precautions:  AWOL  Laboratory:  As per the ED  Psychotherapy:  Individual/Group  Medications:  Detox, and reassess mood  Routine PRN Medications:  Yes  Consultations:    Discharge Concerns:    Other:     Lissy Deuser A 11/20/20132:26 PM

## 2012-03-02 LAB — GLUCOSE, CAPILLARY
Glucose-Capillary: 224 mg/dL — ABNORMAL HIGH (ref 70–99)
Glucose-Capillary: 243 mg/dL — ABNORMAL HIGH (ref 70–99)
Glucose-Capillary: 285 mg/dL — ABNORMAL HIGH (ref 70–99)

## 2012-03-02 MED ORDER — BENZOCAINE 10 % MT GEL
Freq: Three times a day (TID) | OROMUCOSAL | Status: DC | PRN
  Administered 2012-03-02: 14:00:00 via OROMUCOSAL
  Filled 2012-03-02: qty 9.4

## 2012-03-02 MED ORDER — IBUPROFEN 600 MG PO TABS: 600.0000 mg | ORAL_TABLET | Freq: Four times a day (QID) | ORAL | Status: DC | PRN

## 2012-03-02 NOTE — Progress Notes (Signed)
Patient ID: James [name], male   DOB: 06/20/1960, 51 y.o.   MRN: [id number] D: Patient in dayroom on approach. Pt presented with depressed mood and flat affect. Calm and cooperative with assessment. No acute distressed noted. Pt stated passive SI and contracted for safety but denies HI/AV. Pt complained of ongoing tooth pain. Pt encouraged to come to staff with any question or concerns  A: Medications administered as prescribed. Safety has been maintained with Q15 minutes observation. Supported and encouragement provided to attend groups.  R: Patient remains safe. He is complaint with medications and group programming. Safety has been maintained Q15 and continue current POC.

## 2012-03-02 NOTE — Progress Notes (Signed)
[facility] MD Progress Note  03/02/2012 6:42 PM James [name]  MRN:  [id number]  Diagnosis:  Major Depression, Cocaine dependence, Alcohol Abuse  ADL's:  Intact  Sleep: Fair  Appetite:  Fair  Suicidal Ideation:  Plan:  Admits to ideas, running into traffic Intent:  Denies Means:  Denies Homicidal Ideation:  Plan:  Denies Intent:  Denies Means:  Denies  James [name] admits that he is very upset as he looks at his life and sees no way out. States he has worked all his life, and now cant get a job.He feels he is too depressed to pursue even trying to find one. He and his wife are in danger of losing what they have. Admits that he went to the bridge and was going to cut his throat and started shaking as he held the knife to his neck and something made him change his mind.But endorses that next time he will not.  Mental Status Examination/Evaluation: Objective:  Appearance: Disheveled  Eye Contact::  Minimal  Speech:  Slow and Not spontanous  Volume:  Decreased  Mood:  Anxious and Depressed  Affect:  Restricted and worried  Thought Process:  Coherent and Goal Directed  Orientation:  Full  Thought Content:  Afraid to get out there as he knows he is going to end up hurting himself  Suicidal Thoughts:  Yes.  with intent/plan  Homicidal Thoughts:  No  Memory:  Immediate;   Fair Recent;   Fair Remote;   Fair  Judgement:  Poor  Insight:  Present  Psychomotor Activity:  Decreased  Concentration:  Fair  Recall:  Fair  Akathisia:  No  Handed:  Right  AIMS (if indicated):     Assets:  Desire for Improvement  Sleep:  Number of Hours: 5.75    Vital Signs:Blood pressure 116/70, pulse 105, temperature 97.5 F (36.4 C), temperature source Oral. Current Medications: Current Facility-Administered Medications  Medication Dose Route Frequency Provider Last Rate Last Dose  . benzocaine (ORAJEL) 10 % mucosal gel   Mouth/Throat TID PRN Verne Spurr, PA-C      . chlordiazePOXIDE (LIBRIUM) capsule  25 mg  25 mg Oral Q6H PRN Nanine Means, NP      . [COMPLETED] chlordiazePOXIDE (LIBRIUM) capsule 25 mg  25 mg Oral QID Nanine Means, NP   25 mg at 03/01/12 2305   Followed by  . chlordiazePOXIDE (LIBRIUM) capsule 25 mg  25 mg Oral TID Nanine Means, NP   25 mg at 03/02/12 1203   Followed by  . chlordiazePOXIDE (LIBRIUM) capsule 25 mg  25 mg Oral BH-qamhs Nanine Means, NP       Followed by  . chlordiazePOXIDE (LIBRIUM) capsule 25 mg  25 mg Oral Daily Nanine Means, NP      . hydrOXYzine (ATARAX/VISTARIL) tablet 25 mg  25 mg Oral Q6H PRN Nanine Means, NP   25 mg at 03/01/12 2125  . ibuprofen (ADVIL,MOTRIN) tablet 600 mg  600 mg Oral Q6H PRN Nanine Means, NP   600 mg at 03/02/12 1339  . ibuprofen (ADVIL,MOTRIN) tablet 600 mg  600 mg Oral Q6H PRN Rachael Fee, MD      . insulin aspart (novoLOG) injection 0-15 Units  0-15 Units Subcutaneous TID WC Kerry Hough, PA   15 Units at 03/02/12 1658  . insulin aspart (novoLOG) injection 0-5 Units  0-5 Units Subcutaneous QHS Kerry Hough, PA   5 Units at 03/01/12 2302  . insulin aspart protamine-insulin aspart (NOVOLOG 70/30) injection 16 Units  16 Units Subcutaneous Q supper Kerry Hough, [place]   16 Units at 03/02/12 1658  . [COMPLETED] insulin aspart protamine-insulin aspart (NOVOLOG 70/30) injection 6 Units  6 Units Subcutaneous Once Kerry Hough, PA   6 Units at 03/01/12 2305  . loperamide (IMODIUM) capsule 2-4 mg  2-4 mg Oral PRN Nanine Means, NP      . metFORMIN (GLUCOPHAGE) tablet 1,000 mg  1,000 mg Oral BID WC Nanine Means, NP   1,000 mg at 03/02/12 1656  . multivitamin with minerals tablet 1 tablet  1 tablet Oral Daily Nanine Means, NP   1 tablet at 03/02/12 0757  . ondansetron (ZOFRAN-ODT) disintegrating tablet 4 mg  4 mg Oral Q6H PRN Nanine Means, NP      . thiamine (VITAMIN B-1) tablet 100 mg  100 mg Oral Daily Nanine Means, NP   100 mg at 03/02/12 0758  . traZODone (DESYREL) tablet 50 mg  50 mg Oral QHS PRN Nanine Means, NP      .  [DISCONTINUED] insulin aspart (novoLOG) injection 4 Units  4 Units Subcutaneous TID WC Kerry Hough, PA      . [DISCONTINUED] insulin aspart protamine-insulin aspart (NOVOLOG 70/30) injection 10 Units  10 Units Subcutaneous Q supper Nanine Means, NP   10 Units at 03/01/12 1647  . [DISCONTINUED] insulin aspart protamine-insulin aspart (NOVOLOG 70/30) injection 16 Units  16 Units Subcutaneous Q supper Kerry Hough, PA      . [DISCONTINUED] insulin aspart protamine-insulin aspart (NOVOLOG 70/30) injection 16 Units  16 Units Subcutaneous Q supper Kerry Hough, PA        Lab Results:  Results for orders placed during the hospital encounter of 03/01/12 (from the past 48 hour(s))  GLUCOSE, CAPILLARY     Status: Abnormal   Collection Time   03/01/12 11:29 AM      Component Value Range Comment   Glucose-Capillary 322 (*) 70 - 99 mg/dL    Comment 1 Notify RN     GLUCOSE, CAPILLARY     Status: Abnormal   Collection Time   03/01/12  4:43 PM      Component Value Range Comment   Glucose-Capillary 240 (*) 70 - 99 mg/dL   GLUCOSE, CAPILLARY     Status: Abnormal   Collection Time   03/01/12 10:28 PM      Component Value Range Comment   Glucose-Capillary 394 (*) 70 - 99 mg/dL   GLUCOSE, CAPILLARY     Status: Abnormal   Collection Time   03/01/12 10:34 PM      Component Value Range Comment   Glucose-Capillary 369 (*) 70 - 99 mg/dL   GLUCOSE, CAPILLARY     Status: Abnormal   Collection Time   03/02/12 12:04 AM      Component Value Range Comment   Glucose-Capillary 285 (*) 70 - 99 mg/dL   GLUCOSE, CAPILLARY     Status: Abnormal   Collection Time   03/02/12  6:08 AM      Component Value Range Comment   Glucose-Capillary 224 (*) 70 - 99 mg/dL   GLUCOSE, CAPILLARY     Status: Abnormal   Collection Time   03/02/12 11:49 AM      Component Value Range Comment   Glucose-Capillary 249 (*) 70 - 99 mg/dL    Comment 1 Notify RN     GLUCOSE, CAPILLARY     Status: Abnormal   Collection Time    03/02/12  4:43 PM  Component Value Range Comment   Glucose-Capillary 378 (*) 70 - 99 mg/dL    Comment 1 Documented in Chart      Comment 2 Notify RN       Physical Findings: AIMS:  , ,  ,  ,    CIWA:  CIWA-Ar Total: 0  COWS:     Treatment Plan Summary: Daily contact with patient to assess and evaluate symptoms and progress in treatment Medication management  Plan: Supportive approach/coping skills/relapse prevention           Continue Detox  Mathew Storck A 03/02/2012, 6:42 PM

## 2012-03-02 NOTE — [facility] Counselor (Signed)
Adult Comprehensive Assessment  Patient ID: James [name], male   DOB: 09-24-1960, 51 y.o.   MRN: [id number]  Information Source:    Current Stressors:  Educational / Learning stressors: none Employment / Job issues: patient currently unemployed Family Relationships: Per patient, patient's spouse very upset with him due to current relapse after several years of being sober.  Patient concerned if he will be able to return home due to his recent relapse Financial / Lack of resources (include bankruptcy): patient unemployed and currently ha no income Housing / Lack of housing: patient currenlty lives with his wife however is unsure at this time if he can return Physical health (include injuries & life threatening diseases): diabetes Substance abuse: crack cocaine, reports smoking a gram daily sometimes more Bereavement / Loss: patient's father died 2 years ago  Living/Environment/Situation:  Living Arrangements: Spouse/significant other Living conditions (as described by patient or guardian): patient reports having a loving, supportive relationship with his wife until his recet relapse How long has patient lived in current situation?: patient reports being married for 30 years What is atmosphere in current home: Comfortable;Loving;Supportive  Family History:  Marital status: Married Number of Years Married: 30  What types of issues is patient dealing with in the relationship?: Per patient, his drug use is the major struggle in his relationship Additional relationship information: none Does patient have children?: Yes How many children?: 3  How is patient's relationship with their children?: very close, however relationship now strainded due to his drug use  Childhood History:  By whom was/is the patient raised?: Both parents Description of patient's relationship with caregiver when they were a child: very loving , caring and supportvie.  Per patient, father kept the family life  peaceful Patient's description of current relationship with people who raised him/her: patient's father died 2 years ago, mother resides in [place] and has very limited contact with her Does patient have siblings?: Yes Number of Siblings: 5  Description of patient's current relationship with siblings: not as close as they reportedly hold grudges against patient for his past behaviors Did patient suffer any verbal/emotional/physical/sexual abuse as a child?: No Did patient suffer from severe childhood neglect?: No Has patient ever been sexually abused/assaulted/raped as an adolescent or adult?: No Was the patient ever a victim of a crime or a disaster?: No Witnessed domestic violence?: No Has patient been effected by domestic violence as an adult?: No  Education:  Highest grade of school patient has completed: 12 Currently a student?: No Name of school: NA Contact person: NA Learning disability?: No  Employment/Work Situation:   Employment situation: Unemployed Patient's job has been impacted by current illness: No What is the longest time patient has a held a job?: 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 [employer] Has patient ever been in the [employer]?: No Has patient ever served in Buyer, retail?: No  Financial Resources:   Surveyor, quantity resources: No income Does patient have a Lawyer or guardian?: No  Alcohol/Substance Abuse:   What has been your use of drugs/alcohol within the last 12 months?: crack cocaine 1 gram daily some days more throughout the day If attempted suicide, did drugs/alcohol play a role in this?: Yes Alcohol/Substance Abuse Treatment Hx: Past Tx, Inpatient If yes, describe [facility] in [place], [place] Has alcohol/substance abuse ever caused legal problems?: Yes  Social Support System:   [employer] Support System: None  Describe Community  Support System: none Type of faith/religion: christian How does patient's faith help to cope with current illness?: I know God can heal me through prayer  Leisure/Recreation:   Leisure and Hobbies: fishing  Strengths/Needs:   What things does the patient do well?: "I am a decent person" In what areas does patient struggle / problems for patient: drug addiction  Discharge Plan:   Does patient have access to transportation?: Yes Will patient be returning to same living situation after discharge?: No Plan for living situation after discharge: Patient would like a referral to a long term program Currently receiving community mental health services: No If no, would patient like referral for services when discharged?: Yes (What county?) ([place]) Does patient have financial barriers related to discharge medications?: Yes Patient description of barriers related to discharge medications: patient does not have medcial coverage  Summary/Recommendations:   Summary and Recommendations (to be completed by the evaluator): mood stabilization, medication monitoring and adjustment, group therapy and psycho-education, suicide risk assessment, collateral contact, aftercareplanning, ongoing physcian assessments an d safety checks q 15 mins.  Lethia Donlon, [place]. 03/02/2012

## 2012-03-02 NOTE — Progress Notes (Signed)
Patient ID: James [name], male   DOB: 07-19-1960, 51 y.o.   MRN: [id number] He has been up and to some of the groups interacting some with peers. Has c/o having a tooth ache and had received pain medication. Says that he wants to go to long term when discharged.  He says that he is more down today today because of how he has been in the past and he wants to learn what his triggers are so that his behavior will improve.

## 2012-03-02 NOTE — Clinical Social Work Note (Signed)
Aftercare Planning Group: 03/02/2012 9:45 AM  Pt attended discharge planning group and actively participated in group.  CSW provided pt with today's workbook.  Pt presents with flat affect and depressed mood.  Pt rates depression at an 8 and denies having anxiety.  Pt denies SI today.  Pt is interested in long term treatment.  CSW provided pt with an application for [facility] and will assess for appropriate referrals.  No further needs voiced by pt at this time.  Safety planning and suicide prevention discussed.  Pt participated in discussion and acknowledged an understanding of the information provided.       [facility] Group Note : Clinical Social Worker Group Therapy  03/02/2012  1:15 PM  Type of Therapy:  Group Therapy - Process Group  Participation Level:  none  Participation Quality:  none  Affect:  Flat, depressed  Cognitive:  Alert  Insight:  None  Engagement in Group:  None  Engagement in Therapy:  None  Modes of Intervention:  Support  Summary of Progress/Problems:  Patient attended group on maintaining balance but did not participate Chelsea Horton, LCSWA 03/02/2012 3:00 pm

## 2012-03-03 LAB — GLUCOSE, CAPILLARY
Glucose-Capillary: 269 mg/dL — ABNORMAL HIGH (ref 70–99)
Glucose-Capillary: 206 mg/dL — ABNORMAL HIGH (ref 70–99)
Glucose-Capillary: 268 mg/dL — ABNORMAL HIGH (ref 70–99)

## 2012-03-03 NOTE — Progress Notes (Signed)
[facility] MD Progress Note  03/03/2012 4:06 PM James [name]  MRN:  [id number]  Diagnosis:  Major Depression, Alcohol Abuse, Cocaine Dependence  ADL's:  Intact  Sleep: Fair  Appetite:  Fair  Suicidal Ideation:  Plan:  Ideas with plan walk in traffic Intent:  Denies Means:  Denies Homicidal Ideation:  Plan:  Denies Intent:  denies Means:  Denies  Dealing with [name] lot of regrets, shame and quilt for what he has made his family go through. He is feeling down depressed. Says he would never have thought he was going to get so low. He remembers when he was working, busy, involved. As he has not been able to work, his self esteem has gotten down what has facilitated his use of cocaine. Admits he is still feeling suicidal  Mental Status Examination/Evaluation: Objective:  Appearance: Fairly Groomed  Patent attorney::  Fair  Speech:  Clear and Coherent and Slow  Volume:  Decreased  Mood:  Depressed  Affect:  Constricted  Thought Process:  Circumstantial, Coherent and Goal Directed  Orientation:  Full  Thought Content:  Rumination  Suicidal Thoughts:  Yes.  with intent/plan  Homicidal Thoughts:  No  Memory:  Immediate;   Fair Recent;   Fair Remote;   Fair  Judgement:  Poor  Insight:  Fair  Psychomotor Activity:  Decreased  Concentration:  Fair  Recall:  Fair  Akathisia:  No  Handed:  Right  AIMS (if indicated):     Assets:  Desire for Improvement  Sleep:  Number of Hours: 5.75    Vital Signs:Blood pressure 140/82, pulse 97, temperature 97.5 F (36.4 C), temperature source Oral. Current Medications: Current Facility-Administered Medications  Medication Dose Route Frequency Provider Last Rate Last Dose  . benzocaine (ORAJEL) 10 % mucosal gel   Mouth/Throat TID PRN Verne Spurr, PA-C      . chlordiazePOXIDE (LIBRIUM) capsule 25 mg  25 mg Oral Q6H PRN Nanine Means, NP      . [EXPIRED] chlordiazePOXIDE (LIBRIUM) capsule 25 mg  25 mg Oral TID Nanine Means, NP   25 mg at 03/02/12 1203    Followed by  . chlordiazePOXIDE (LIBRIUM) capsule 25 mg  25 mg Oral BH-qamhs Nanine Means, NP       Followed by  . chlordiazePOXIDE (LIBRIUM) capsule 25 mg  25 mg Oral Daily Nanine Means, NP      . hydrOXYzine (ATARAX/VISTARIL) tablet 25 mg  25 mg Oral Q6H PRN Nanine Means, NP   25 mg at 03/02/12 2216  . ibuprofen (ADVIL,MOTRIN) tablet 600 mg  600 mg Oral Q6H PRN Nanine Means, NP   600 mg at 03/02/12 2215  . ibuprofen (ADVIL,MOTRIN) tablet 600 mg  600 mg Oral Q6H PRN Rachael Fee, MD      . insulin aspart (novoLOG) injection 0-15 Units  0-15 Units Subcutaneous TID WC Kerry Hough, PA   5 Units at 03/03/12 1222  . insulin aspart (novoLOG) injection 0-5 Units  0-5 Units Subcutaneous QHS Kerry Hough, PA   2 Units at 03/02/12 2211  . insulin aspart protamine-insulin aspart (NOVOLOG 70/30) injection 16 Units  16 Units Subcutaneous Q supper Kerry Hough, PA   16 Units at 03/02/12 1658  . loperamide (IMODIUM) capsule 2-4 mg  2-4 mg Oral PRN Nanine Means, NP      . metFORMIN (GLUCOPHAGE) tablet 1,000 mg  1,000 mg Oral BID WC Nanine Means, NP   1,000 mg at 03/03/12 0829  . multivitamin with minerals tablet  1 tablet  1 tablet Oral Daily Nanine Means, NP   1 tablet at 03/03/12 0829  . ondansetron (ZOFRAN-ODT) disintegrating tablet 4 mg  4 mg Oral Q6H PRN Nanine Means, NP      . thiamine (VITAMIN B-1) tablet 100 mg  100 mg Oral Daily Nanine Means, NP   100 mg at 03/03/12 0829  . traZODone (DESYREL) tablet 50 mg  50 mg Oral QHS PRN Nanine Means, NP        Lab Results:  Results for orders placed during the hospital encounter of 03/01/12 (from the past 48 hour(s))  GLUCOSE, CAPILLARY     Status: Abnormal   Collection Time   03/01/12  4:43 PM      Component Value Range Comment   Glucose-Capillary 240 (*) 70 - 99 mg/dL   GLUCOSE, CAPILLARY     Status: Abnormal   Collection Time   03/01/12 10:28 PM      Component Value Range Comment   Glucose-Capillary 394 (*) 70 - 99 mg/dL   GLUCOSE,  CAPILLARY     Status: Abnormal   Collection Time   03/01/12 10:34 PM      Component Value Range Comment   Glucose-Capillary 369 (*) 70 - 99 mg/dL   GLUCOSE, CAPILLARY     Status: Abnormal   Collection Time   03/02/12 12:04 AM      Component Value Range Comment   Glucose-Capillary 285 (*) 70 - 99 mg/dL   GLUCOSE, CAPILLARY     Status: Abnormal   Collection Time   03/02/12  6:08 AM      Component Value Range Comment   Glucose-Capillary 224 (*) 70 - 99 mg/dL   GLUCOSE, CAPILLARY     Status: Abnormal   Collection Time   03/02/12 11:49 AM      Component Value Range Comment   Glucose-Capillary 249 (*) 70 - 99 mg/dL    Comment 1 Notify RN     GLUCOSE, CAPILLARY     Status: Abnormal   Collection Time   03/02/12  4:43 PM      Component Value Range Comment   Glucose-Capillary 378 (*) 70 - 99 mg/dL    Comment 1 Documented in Chart      Comment 2 Notify RN     GLUCOSE, CAPILLARY     Status: Abnormal   Collection Time   03/02/12 10:06 PM      Component Value Range Comment   Glucose-Capillary 243 (*) 70 - 99 mg/dL    Comment 1 Notify RN      Comment 2 Documented in Chart     GLUCOSE, CAPILLARY     Status: Abnormal   Collection Time   03/03/12  6:03 AM      Component Value Range Comment   Glucose-Capillary 269 (*) 70 - 99 mg/dL   GLUCOSE, CAPILLARY     Status: Abnormal   Collection Time   03/03/12 12:17 PM      Component Value Range Comment   Glucose-Capillary 206 (*) 70 - 99 mg/dL     Physical Findings: AIMS:  , ,  ,  ,    CIWA:  CIWA-Ar Total: 0  COWS:     Treatment Plan Summary: Daily contact with patient to assess and evaluate symptoms and progress in treatment Medication management  Plan: Supportive approach/coping skill/relapse prevention           Continue detox, reassess mood  James [name] 03/03/2012, 4:06 PM

## 2012-03-03 NOTE — Clinical Social Work Note (Signed)
Aftercare Planning Group: 03/03/2012 9:45 AM  Pt attended discharge planning group and actively participated in group.  CSW provided pt with today's workbook.  Pt presents with drowsy mood and affect.  Pt rates depression and anxiety at an 8 today.  Pt denies SI, stating not right now.   Pt has a date for [facility] on 12/4.  Pt states that he'd prefer to go straight to [facility].  CSW will assess for appropriate referrals.  No further needs voiced by pt at this time.  [facility] Group Note : Clinical Social Worker Group Therapy  03/03/2012  1:15 PM  Type of Therapy:  Group Therapy - Process Group  Participation Level:  Appropriate  Participation Quality:  Appropriate   Affect:  Appropriate  Cognitive:  Alert  Insight:  Good  Engagement in Group:  Good  Engagement in Therapy:  Good  Modes of Intervention:  Clarification, Education, Problem-solving, Socialization and Support  Summary of Progress/Problems: The topic for today was feelings about relapse.  Pt discussed what relapse prevention is to them and identified triggers that they are on the path to relapse.  Pt processed their feeling towards relapse and was able to relate to peers.  Pt discussed coping skills that can be used for relapse prevention.   Pt discussed his 6 years of clean time and how he got comfortable with his recovery and believes this was his downfall.     Gladyce Mcray Horton, LCSWA 03/03/2012 3:00 pm

## 2012-03-03 NOTE — Progress Notes (Signed)
Psychoeducational Group Note  Date:  03/03/2012 Time:  2000  Group Topic/Focus:  AA group  Participation Level:  Active  Participation Quality:  Appropriate  Affect:  Appropriate  Cognitive:  Alert  Insight:  Good  Engagement in Group:  Good  Additional Comments:    Zacary Bauer R 03/03/2012, 9:51 PM

## 2012-03-03 NOTE — Progress Notes (Signed)
[facility] Group Notes:  (Counselor/Nursing/MHT/Case Management/Adjunct)  03/03/2012 7:16 PM  Type of Therapy:  Psychoeducational Skills  Participation Level:  None  Participation Quality:  Drowsy and Inattentive  Affect:  Flat  Cognitive:  Appropriate  Insight:  pt did not participate in group.   Engagement in Group:  None  Engagement in Therapy:  None  Modes of Intervention:  Activity, Problem-solving and Socialization  Summary of Progress/Problems: Pt attended coping skills pictionary but did not participate as he was drowsy/inattentive during group.    Dalia Heading 03/03/2012, 7:16 PM

## 2012-03-03 NOTE — Progress Notes (Signed)
Patient ID: James [name], male   DOB: [birth year], 51 y.o.   MRN: [id number] D: Patient in dayroom on approach. Pt presented with depressed mood and flat affect. Pt interacting appropriately with peers. Calm and cooperative with assessment. No acute distressed noted. Denies SI/HI/AV and pain. Pt encouraged to come to staff with any question or concerns  A: meet with pt 1:1. Medication administered as prescribed. safety has been maintained with Q15 minutes observation. Supported and encouragement provided to attend groups.  R: Patient remains safe. He is complaint with medications and group programming. Safety has been maintained Q15 and continue current POC.

## 2012-03-03 NOTE — Tx Team (Signed)
Interdisciplinary Treatment Plan Update (Adult)  Date:  03/03/2012  Time Reviewed:  9:35 AM   Progress in Treatment: Attending groups: Yes Participating in groups:  Yes Taking medication as prescribed: Yes Tolerating medication:  Yes Family/Significant othe contact made: CSW assessing for appropriate contact Patient understands diagnosis:  Yes Discussing patient identified problems/goals with staff:  Yes Medical problems stabilized or resolved:  Yes Denies suicidal/homicidal ideation: Yes Issues/concerns per patient self-inventory:  None identified Other: N/A  New problem(s) identified: None Identified  Reason for Continuation of Hospitalization: Anxiety Depression Medication stabilization Withdrawal symptoms  Interventions implemented related to continuation of hospitalization: mood stabilization, medication monitoring and adjustment, group therapy and psycho education, suicide risk assessment, collateral contact, aftercare planning, ongoing physician assessments and safety checks q 15 mins  Additional comments: N/A  Estimated length of stay: 3-5 days  Discharge Plan: CSW is assessing for appropriate referrals. Pt wants to go to [facility] or [facility].    New goal(s): N/A  Review of initial/current patient goals per problem list:    1.  Goal(s): Address substance use by completing detox protocol  Met:  No  Target date: 4 days  As evidenced by: completes detox 11/24  2.  Goal (s): Reduce depressive symptoms from a 10 to a 3  Met:  No  Target date: 3-5 days  As evidenced by: Pt rates at an 8 today.    3.  Goal (s): Reduce anxiety symptoms from a 10 to a 3  Met:  No  Target date:  3-5 days  As evidenced by: Pt rates at an 8 today.    4.  Goal(s): Eliminate SI  Met:  No  Target date:   As evidenced by: pt denying SI.     Attendees: Patient:     Family:     Physician: Geoffery Lyons, MD 03/03/2012 9:35 AM   Nursing: Alease Frame, RN 03/03/2012  9:35 AM   Clinical Social Worker:  Reyes Ivan, LCSWA 03/03/2012  9:35 AM   Other: Stephannie Li, RN 03/03/2012  9:35 AM   Other:     Other:     Other:     Other:      Scribe for Treatment Team:   Reyes Ivan 03/03/2012 9:35 AM

## 2012-03-03 NOTE — Progress Notes (Signed)
Patient did attend the second half of the evening karaoke group. Patient participated by singing a song.

## 2012-03-04 DIAGNOSIS — F1010 Alcohol abuse, uncomplicated: Secondary | ICD-10-CM

## 2012-03-04 DIAGNOSIS — F329 Major depressive disorder, single episode, unspecified: Secondary | ICD-10-CM

## 2012-03-04 DIAGNOSIS — F1420 Cocaine dependence, uncomplicated: Secondary | ICD-10-CM

## 2012-03-04 LAB — GLUCOSE, CAPILLARY: Glucose-Capillary: 203 mg/dL — ABNORMAL HIGH (ref 70–99)

## 2012-03-04 NOTE — Progress Notes (Signed)
[facility] Group Notes:  (Counselor/Nursing/MHT/Case Management/Adjunct)  03/04/2012 2100  Type of Therapy:  wrap up group  Participation Level:  Active  Participation Quality:  Appropriate, Attentive, Sharing and Supportive  Affect:  Appropriate  Cognitive:  Alert and Appropriate  Insight:  Good  Engagement in Group:  Good  Engagement in Therapy:  Good  Modes of Intervention:  Clarification, Education and Support  Summary of Progress/Problems:   James [name] 03/04/2012, 11:24 PM

## 2012-03-04 NOTE — Progress Notes (Signed)
[facility] MD Progress Note  03/04/2012 3:39 PM James [name]  MRN:  [id number]  Diagnosis:   Axis I: Alcohol Abuse, Major Depression, Recurrent severe and Cocaine dependence Axis II: Deferred Axis III:  Past Medical History  Diagnosis Date  . Diabetes mellitus without complication    Subjective: James [name] approach this Clinical research associate to ask when he can be discharged. He expresses a desire to go to an outpatient treatment program. He reports that his mood is good today. He denies any suicidal or homicidal ideation. He denies any auditory or visual hallucinations. He reports he slept well last night, and his appetite is good. He denies any cravings or withdrawal.  ADL's:  Intact  Sleep: Good  Appetite:  Good  Suicidal Ideation:  Patient denies any thought, plan, or intent Homicidal Ideation:  Patient denies any thought, plan, or intent  AEB (as evidenced by):  Mental Status Examination/Evaluation: Objective:  Appearance: Casual and Fairly Groomed  Eye Contact::  Good  Speech:  Clear and Coherent  Volume:  Normal  Mood:  Anxious  Affect:  Congruent  Thought Process:  Circumstantial  Orientation:  Full  Thought Content:  WDL  Suicidal Thoughts:  No  Homicidal Thoughts:  No  Memory:  Immediate;   Good Recent;   Good Remote;   Good  Judgement:  Impaired  Insight:  Lacking  Psychomotor Activity:  Normal  Concentration:  Good  Recall:  Good  Akathisia:  No  Handed:    AIMS (if indicated):     Assets:  Physical Health  Sleep:  Number of Hours: 6    Vital Signs:Blood pressure 123/77, pulse 86, temperature 98 F (36.7 C), temperature source Oral, resp. rate 16. Current Medications: Current Facility-Administered Medications  Medication Dose Route Frequency Provider Last Rate Last Dose  . benzocaine (ORAJEL) 10 % mucosal gel   Mouth/Throat TID PRN Verne Spurr, PA-C      . [EXPIRED] chlordiazePOXIDE (LIBRIUM) capsule 25 mg  25 mg Oral Q6H PRN Nanine Means, NP      . [EXPIRED]  chlordiazePOXIDE (LIBRIUM) capsule 25 mg  25 mg Oral BH-qamhs Nanine Means, NP   25 mg at 03/03/12 2217   Followed by  . [COMPLETED] chlordiazePOXIDE (LIBRIUM) capsule 25 mg  25 mg Oral Daily Nanine Means, NP   25 mg at 03/04/12 0945  . [EXPIRED] hydrOXYzine (ATARAX/VISTARIL) tablet 25 mg  25 mg Oral Q6H PRN Nanine Means, NP   25 mg at 03/02/12 2216  . ibuprofen (ADVIL,MOTRIN) tablet 600 mg  600 mg Oral Q6H PRN Nanine Means, NP   600 mg at 03/02/12 2215  . ibuprofen (ADVIL,MOTRIN) tablet 600 mg  600 mg Oral Q6H PRN Rachael Fee, MD      . insulin aspart (novoLOG) injection 0-15 Units  0-15 Units Subcutaneous TID WC Kerry Hough, PA   11 Units at 03/04/12 1202  . insulin aspart (novoLOG) injection 0-5 Units  0-5 Units Subcutaneous QHS Kerry Hough, PA   2 Units at 03/02/12 2211  . insulin aspart protamine-insulin aspart (NOVOLOG 70/30) injection 16 Units  16 Units Subcutaneous Q supper Kerry Hough, PA   16 Units at 03/03/12 1721  . [EXPIRED] loperamide (IMODIUM) capsule 2-4 mg  2-4 mg Oral PRN Nanine Means, NP      . metFORMIN (GLUCOPHAGE) tablet 1,000 mg  1,000 mg Oral BID WC Nanine Means, NP   1,000 mg at 03/04/12 0946  . multivitamin with minerals tablet 1 tablet  1 tablet Oral Daily  Nanine Means, NP   1 tablet at 03/04/12 [phone]  . [EXPIRED] ondansetron (ZOFRAN-ODT) disintegrating tablet 4 mg  4 mg Oral Q6H PRN Nanine Means, NP      . thiamine (VITAMIN B-1) tablet 100 mg  100 mg Oral Daily Nanine Means, NP   100 mg at 03/04/12 0946  . traZODone (DESYREL) tablet 50 mg  50 mg Oral QHS PRN Nanine Means, NP        Lab Results:  Results for orders placed during the hospital encounter of 03/01/12 (from the past 48 hour(s))  GLUCOSE, CAPILLARY     Status: Abnormal   Collection Time   03/02/12  4:43 PM      Component Value Range Comment   Glucose-Capillary 378 (*) 70 - 99 mg/dL    Comment 1 Documented in Chart      Comment 2 Notify RN     GLUCOSE, CAPILLARY     Status: Abnormal    Collection Time   03/02/12 10:06 PM      Component Value Range Comment   Glucose-Capillary 243 (*) 70 - 99 mg/dL    Comment 1 Notify RN      Comment 2 Documented in Chart     GLUCOSE, CAPILLARY     Status: Abnormal   Collection Time   03/03/12  6:03 AM      Component Value Range Comment   Glucose-Capillary 269 (*) 70 - 99 mg/dL   GLUCOSE, CAPILLARY     Status: Abnormal   Collection Time   03/03/12 12:17 PM      Component Value Range Comment   Glucose-Capillary 206 (*) 70 - 99 mg/dL   GLUCOSE, CAPILLARY     Status: Abnormal   Collection Time   03/03/12  5:04 PM      Component Value Range Comment   Glucose-Capillary 268 (*) 70 - 99 mg/dL   GLUCOSE, CAPILLARY     Status: Abnormal   Collection Time   03/03/12  9:22 PM      Component Value Range Comment   Glucose-Capillary 181 (*) 70 - 99 mg/dL   GLUCOSE, CAPILLARY     Status: Abnormal   Collection Time   03/04/12  6:03 AM      Component Value Range Comment   Glucose-Capillary 203 (*) 70 - 99 mg/dL    Comment 1 Notify RN      Comment 2 Documented in Chart     GLUCOSE, CAPILLARY     Status: Abnormal   Collection Time   03/04/12 11:57 AM      Component Value Range Comment   Glucose-Capillary 330 (*) 70 - 99 mg/dL    Comment 1 Notify RN       Physical Findings: AIMS:  , ,  ,  ,    CIWA:  CIWA-Ar Total: 0  COWS:     Treatment Plan Summary: Daily contact with patient to assess and evaluate symptoms and progress in treatment Medication management  Plan: We will continue his current plan of care, and research possible followup treatment facilities.  Sarahmarie Leavey 03/04/2012, 3:39 PM

## 2012-03-04 NOTE — Progress Notes (Signed)
Psychoeducational Group Note  Date: 03/04/2012  Time: 1315  Group Topic/Focus:  Personal Development  Participation Level: Minimal Participation Quality: Appropriate  Affect: Depressed  Cognitive: Appropriate  Engagement in Group:Appropriate

## 2012-03-04 NOTE — Clinical Social Work Note (Signed)
[facility] Group Notes:  (Clinical Social Work)  03/04/2012  10:00-11:00AM  Summary of Progress/Problems:   The main focus of today's process group was for the patient to identify ways in which they have in the past sabotaged their own recovery and reasons they may have done this/what they received from doing it.  We then worked to identify a specific plan to avoid doing this when discharged from the hospital for this admission.  The patient expressed that he self-sabotages by telling himself he can handle "just one" and that he has been sober so long, he deserves to give himself a treat.  He was attentive throughout group and acknowledged that this is a false statement that he should practice following with a true statement.  The group discussed how this is just an excuse to use and he agreed fully.  Type of Therapy:  Group Therapy - Process  Participation Level:  Active  Participation Quality:  Appropriate, Attentive and Sharing  Affect:  Appropriate and Blunted  Cognitive:  Alert, Appropriate and Oriented  Insight:  Good  Engagement in Group:  Good  Engagement in Therapy:  Good  Modes of Intervention:  Clarification, Education, Limit-setting, Problem-solving, Socialization, Support and Processing   [name], LCSW 03/04/2012, 12:32 PM

## 2012-03-05 DIAGNOSIS — F332 Major depressive disorder, recurrent severe without psychotic features: Secondary | ICD-10-CM

## 2012-03-05 LAB — GLUCOSE, CAPILLARY
Glucose-Capillary: 273 mg/dL — ABNORMAL HIGH (ref 70–99)
Glucose-Capillary: 213 mg/dL — ABNORMAL HIGH (ref 70–99)
Glucose-Capillary: 275 mg/dL — ABNORMAL HIGH (ref 70–99)

## 2012-03-05 NOTE — Progress Notes (Signed)
D: Pt cooperative and present on milieu, attended groups but interaction was guarded.  Complains of depression; affect anxious, mood anxious and depressed.  Eye contact is fair but with anxious expression; speech is logical and coherent with normal rate, rhythm, and volume.  No evidence of disturbance in thought process or content.  Denies SI/HI, AVH, and acute pain.  Contracting for safety. At 0010, MHT reported that Pt expressed concern about swollen ankles and that, when she weighed him today, his weight had increased by 7 pounds in three days. Pt's feet slightly edematous with no pitting; pedal pulses strong bilaterally. CBG at 2126=79; no insulin coverage administered with snack.  Pt scheduled for discharge to [facility] on 03/15/2012.  A: Engaged Pt in 1:1, provided support and encouragement. Pt received PRN Trazodone 50mg  PO @2315  with no effect. All medications administered according to med orders and POC.  Q15 minute safety checks maintained as per unit protocol.  Pt's feet elevated to reduce swelling for reevaluation in am.    R: Pt quiet and polite throughout shift.  Complied with elevation of feet.  Fell asleep shortly after nursing care completed.  Safety maintained.  Dion Saucier RN

## 2012-03-05 NOTE — Progress Notes (Signed)
[facility] MD Progress Note  03/05/2012 1:23 PM James [name]  MRN:  [id number]  Diagnosis:   Axis I: Alcohol Abuse, Major Depression, Recurrent severe and Cocaine dependence Axis II: Deferred Axis III:  Past Medical History  Diagnosis Date  . Diabetes mellitus without complication    Subjective: James [name] reports that he is doing extremely well today. He denies any depression or anxiety. He denies any cravings or withdrawal symptoms. He denies any suicidal or homicidal ideation. He denies any auditory or visual hallucinations. He is looking forward to being discharged, and plans to return home with his wife. He expresses a strong interest in getting involved in the recovery community and attending 12 step meetings.  ADL's:  Intact  Sleep: Good  Appetite:  Good  Suicidal Ideation:  Patient denies any thought, plan, or intent Homicidal Ideation:  Patient denies any thought, plan, or intent  AEB (as evidenced by):  Mental Status Examination/Evaluation: Objective:  Appearance: Casual and Fairly Groomed  Eye Contact::  Good  Speech:  Clear and Coherent  Volume:  Normal  Mood:  Euthymic  Affect:  Congruent  Thought Process:  Linear  Orientation:  Full  Thought Content:  WDL  Suicidal Thoughts:  No  Homicidal Thoughts:  No  Memory:  Immediate;   Good Recent;   Good Remote;   Good  Judgement:  Good  Insight:  Good  Psychomotor Activity:  Normal  Concentration:  Good  Recall:  Good  Akathisia:  No  Handed:    AIMS (if indicated):     Assets:  Communication Skills Desire for Improvement Housing Social Support  Sleep:  Number of Hours: 4.5    Vital Signs:Blood pressure 131/75, pulse 93, temperature 97.3 F (36.3 C), temperature source Oral, resp. rate 16. Current Medications: Current Facility-Administered Medications  Medication Dose Route Frequency Provider Last Rate Last Dose  . benzocaine (ORAJEL) 10 % mucosal gel   Mouth/Throat TID PRN Verne Spurr, PA-C      .  ibuprofen (ADVIL,MOTRIN) tablet 600 mg  600 mg Oral Q6H PRN Nanine Means, NP   600 mg at 03/02/12 2215  . ibuprofen (ADVIL,MOTRIN) tablet 600 mg  600 mg Oral Q6H PRN Rachael Fee, MD      . insulin aspart (novoLOG) injection 0-15 Units  0-15 Units Subcutaneous TID WC Kerry Hough, PA   5 Units at 03/05/12 1209  . insulin aspart (novoLOG) injection 0-5 Units  0-5 Units Subcutaneous QHS Kerry Hough, PA   2 Units at 03/02/12 2211  . insulin aspart protamine-insulin aspart (NOVOLOG 70/30) injection 16 Units  16 Units Subcutaneous Q supper Kerry Hough, PA   16 Units at 03/04/12 1729  . metFORMIN (GLUCOPHAGE) tablet 1,000 mg  1,000 mg Oral BID WC Nanine Means, NP   1,000 mg at 03/05/12 0940  . multivitamin with minerals tablet 1 tablet  1 tablet Oral Daily Nanine Means, NP   1 tablet at 03/05/12 0941  . thiamine (VITAMIN B-1) tablet 100 mg  100 mg Oral Daily Nanine Means, NP   100 mg at 03/05/12 0941  . traZODone (DESYREL) tablet 50 mg  50 mg Oral QHS PRN Nanine Means, NP   50 mg at 03/04/12 2315    Lab Results:  Results for orders placed during the hospital encounter of 03/01/12 (from the past 48 hour(s))  GLUCOSE, CAPILLARY     Status: Abnormal   Collection Time   03/03/12  5:04 PM      Component Value  Range Comment   Glucose-Capillary 268 (*) 70 - 99 mg/dL   GLUCOSE, CAPILLARY     Status: Abnormal   Collection Time   03/03/12  9:22 PM      Component Value Range Comment   Glucose-Capillary 181 (*) 70 - 99 mg/dL   GLUCOSE, CAPILLARY     Status: Abnormal   Collection Time   03/04/12  6:03 AM      Component Value Range Comment   Glucose-Capillary 203 (*) 70 - 99 mg/dL    Comment 1 Notify RN      Comment 2 Documented in Chart     GLUCOSE, CAPILLARY     Status: Abnormal   Collection Time   03/04/12 11:57 AM      Component Value Range Comment   Glucose-Capillary 330 (*) 70 - 99 mg/dL    Comment 1 Notify RN     GLUCOSE, CAPILLARY     Status: Abnormal   Collection Time    03/04/12  5:05 PM      Component Value Range Comment   Glucose-Capillary 216 (*) 70 - 99 mg/dL   GLUCOSE, CAPILLARY     Status: Normal   Collection Time   03/04/12  9:26 PM      Component Value Range Comment   Glucose-Capillary 79  70 - 99 mg/dL    Comment 1 Notify RN      Comment 2 Documented in Chart     GLUCOSE, CAPILLARY     Status: Abnormal   Collection Time   03/05/12  6:03 AM      Component Value Range Comment   Glucose-Capillary 273 (*) 70 - 99 mg/dL    Comment 1 Notify RN     GLUCOSE, CAPILLARY     Status: Abnormal   Collection Time   03/05/12 11:29 AM      Component Value Range Comment   Glucose-Capillary 213 (*) 70 - 99 mg/dL     Physical Findings: AIMS: Facial and Oral Movements Muscles of Facial Expression: None, normal Lips and Perioral Area: None, normal Jaw: None, normal Tongue: None, normal,Extremity Movements Upper (arms, wrists, hands, fingers): None, normal Lower (legs, knees, ankles, toes): None, normal, Trunk Movements Neck, shoulders, hips: None, normal, Overall Severity Severity of abnormal movements (highest score from questions above): None, normal Incapacitation due to abnormal movements: None, normal Patient's awareness of abnormal movements (rate only patient's report): No Awareness, Dental Status Current problems with teeth and/or dentures?: No Does patient usually wear dentures?: No  CIWA:  CIWA-Ar Total: 0  COWS:  COWS Total Score: 1   Treatment Plan Summary: Daily contact with patient to assess and evaluate symptoms and progress in treatment Medication management  Plan: We will continue his current plan of care. He is likely ready for discharge in the morning.  Asusena Sigley 03/05/2012, 1:23 PM

## 2012-03-05 NOTE — Progress Notes (Signed)
D.  Pt. Pleasant and cooperative.  Denies SI/HI and denies A/V hallucinations.  Pt. In group.  Pt. Reports no "thoughts of hurting myself".  "I am ready to go home." A.  Encouragement and support given. R.  Pt. Receptive.

## 2012-03-05 NOTE — Progress Notes (Signed)
Psychoeducational Group Note  Date: 03/05/2012  Time: 1315  Group Topic/Focus:  Support Systems Participation Level: Active  Participation Quality: Appropriate  Affect: Appropriate  Cognitive: Appropriate  Engagement in Group:Appropriate

## 2012-03-05 NOTE — Progress Notes (Signed)
Psychoeducational Group Note  Date:  03/05/2012 Time:  1130  Group Topic/Focus:  Spirituality:   The focus of this group is to discuss how one's spirituality can aide in recovery.  Participation Level:  Active  Participation Quality:  Appropriate  Affect:  Appropriate  Cognitive:  Appropriate  Insight:  Good  Engagement in Group:  Good  Additional Comments:    Meredith Staggers 03/05/2012, 2:18 PM

## 2012-03-05 NOTE — Clinical Social Work Note (Signed)
[facility] Group Notes:  (Clinical Social Work)  03/05/2012  10:00-11:00AM  Summary of Progress/Problems:   The main focus of today's process group was for the patient to define "support" and describe what healthy supports are, then to identify the patient's current support system and decide on other supports that can be put in place to prevent future hospitalizations.   An emphasis was placed on using therapist, doctor and problem-specific support groups to expand supports.  The patient expressed that he is lucky his family remains support despite him messing up, but that his wife is "sore" at him for stealing from the home and pawning items to be drugs.  He said the only thing missing in his past sobriety was going to AA/NA groups and working the program.  In the past he was deeply hurt by the misdeeds of an NA leader, and has not trusted groups since.  However, he stated that he has to stop trying to do this on his own.  He would really like to go to an IOP program and CSW explained this may not be possible without insurance.  However, told him to talk to his regular CSW and also to consider putting together his own schedule of groups that would constitute a pretty intensive schedule like IOP.  Type of Therapy:  Group Therapy  Participation Level:  Active  Participation Quality:  Appropriate, Attentive, Sharing and Supportive  Affect:  Blunted  Cognitive:  Alert, Appropriate and Oriented  Insight:  Good  Engagement in Group:  Good  Engagement in Therapy:  Good  Modes of Intervention:  Clarification, Education, Limit-setting, Problem-solving, Socialization, Support and Processing   [name], LCSW 03/05/2012, 12:48 PM

## 2012-03-05 NOTE — Progress Notes (Signed)
Patient ID: James [name], male   DOB: 1960-11-23, 51 y.o.   MRN: [id number] D: Pt. states he has learned that "all of his labs were good" and he determined to stay clean.  "I was clean for 6 months and then my father died and I...(manually indicated by a 'thumbs down")".  Depression he rated at 3/10 and Hopelessness at 2/10.  A: Pt. is alert, oriented Xs 4 and has just showered and is ready to go to lunch.  Pt. slept late this AM and looks refreshed.  Pt. sates he is "ready to get going" and has a positive affect and mood.  CBG @ 11:29=213mg /dL and was given 5 units of Novolog insulin before lunch.  R: Will continue to monitor for changes in symptoms and responses to medications.

## 2012-03-05 NOTE — Progress Notes (Signed)
Psychoeducational Group Note  Date:  03/05/2012 Time: 1515 Group Topic/Focus:  Making Healthy Choices:   The focus of this group is to help patients identify negative/unhealthy choices they were using prior to admission and identify positive/healthier coping strategies to replace them upon discharge.  Participation Level:  Active  Participation Quality:  Appropriate  Affect:  Appropriate  Cognitive:  Appropriate  Insight:  Good  Engagement in Group:  Good  Additional CommentsCasilda [name] 03/05/2012, 6:46 PM

## 2012-03-06 LAB — GLUCOSE, CAPILLARY: Glucose-Capillary: 173 mg/dL — ABNORMAL HIGH (ref 70–99)

## 2012-03-06 MED ORDER — INSULIN ASPART 100 UNIT/ML ~~LOC~~ SOLN: 0.0000 [IU] | Freq: Three times a day (TID) | SUBCUTANEOUS | Status: DC

## 2012-03-06 MED ORDER — THIAMINE HCL 100 MG PO TABS: 100.0000 mg | ORAL_TABLET | Freq: Every day | ORAL | Status: DC

## 2012-03-06 MED ORDER — INSULIN ASPART 100 UNIT/ML ~~LOC~~ SOLN: 0.0000 [IU] | Freq: Every day | SUBCUTANEOUS | Status: DC

## 2012-03-06 MED ORDER — TRAZODONE HCL 50 MG PO TABS: 50.0000 mg | ORAL_TABLET | Freq: Every evening | ORAL | Status: DC | PRN

## 2012-03-06 MED ORDER — METFORMIN HCL 1000 MG PO TABS: 1000.0000 mg | ORAL_TABLET | Freq: Two times a day (BID) | ORAL | Status: DC

## 2012-03-06 MED ORDER — ADULT MULTIVITAMIN W/MINERALS CH: 1.0000 | ORAL_TABLET | Freq: Every day | ORAL | Status: DC

## 2012-03-06 MED ORDER — INSULIN ASPART PROT & ASPART (70-30 MIX) 100 UNIT/ML ~~LOC~~ SUSP: 16.0000 [IU] | Freq: Every day | SUBCUTANEOUS | Status: DC

## 2012-03-06 NOTE — Progress Notes (Signed)
D: Pt resting in bed, eyes closed, respirations even and unlabored.   A: Will continue to monitor.  R: In no apparent distress. Katoya Amato RN 

## 2012-03-06 NOTE — Progress Notes (Signed)
[facility] Adult Inpatient Family/Significant Other Suicide Prevention Education  Suicide Prevention Education:   Patient Refusal for Family/Significant Other Suicide Prevention Education: The patient has refused to provide written consent for family/significant other to be provided Family/Significant Other Suicide Prevention Education during admission and/or prior to discharge.  Physician notified.  CSW provided suicide prevention information with patient.    The suicide prevention education provided includes the following:  Suicide risk factors  Suicide prevention and interventions  National Suicide Hotline telephone number  [facility] assessment telephone number  [facility] Emergency Assistance 911  [facility] and/or Residential Mobile Crisis Unit telephone number   [place], [place] 03/06/2012 12:16 PM

## 2012-03-06 NOTE — Tx Team (Signed)
Interdisciplinary Treatment Plan Update (Adult)  Date:  03/06/2012  Time Reviewed:  5:44 PM   Progress in Treatment: Attending groups: Yes Participating in groups:  Yes Taking medication as prescribed: Yes Tolerating medication:  Yes Family/Significant othe contact made:  No patient refused Patient understands diagnosis:  Yes Discussing patient identified problems/goals with staff:  Yes Medical problems stabilized or resolved:  Yes Denies suicidal/homicidal ideation: Yes Issues/concerns per patient self-inventory:  None identified Other: N/A  New problem(s) identified: None Identified  Reason for Continuation of Hospitalization: Other; describe patient discharged today  Interventions implemented related to continuation of hospitalization: mood stabilization, medication monitoring and adjustment, group therapy and psycho education, suicide risk assessment, collateral contact, aftercare planning, ongoing physician assessments and safety checks q 15 mins  Additional comments: N/A  Estimated length of stay: 3-5 days  Discharge Plan: Patient discharged today to outpatient level of care-[facility]  New goal(s): N/A  Review of initial/current patient goals per problem list:    1.  Goal(s): Address substance use by completing detox protocol  Met:  yes  Target date: 4 days  As evidenced by: patient completed detox protocol  2.  Goal (s): Reduce depressive symptoms from a 10 to a 3  Met:  yes  Target date: 3-5 days  As evidenced by: Pt rates at a 0  3.  Goal (s): Reduce anxiety symptoms from a 10 to a 3  Met:  yes  Target date:  3-5 days  As evidenced by: Pt rates at a  0  4.  Goal(s):  Met:  No  Target date:   As evidenced by:   Attendees: Patient:     Family:     Physician: Geoffery Lyons, MD 03/06/2012 5:44 PM   Nursing: Roswell Miners, RN 03/06/2012 5:44 PM   Clinical Social Worker:  Reyes Ivan, LCSWA 03/06/2012  5:44 PM   Other: Clinical Social  Worker:Moon Budde, LCSW 03/06/2012  5:44 PM   Other:     Other:     Other:     Other:      Scribe for Treatment Team:   Reyes Ivan 03/06/2012 5:44 PM

## 2012-03-06 NOTE — Discharge Summary (Signed)
Physician Discharge Summary Note  Patient:  James [name] is an 51 y.o., male MRN:  [id number] DOB:  [birth year] Patient phone:  [phone] (home)  Patient address:   [address] [place] [address],   Date of Admission:  03/01/2012 Date of Discharge: 03/06/2012  Reason for Admission:  Depression with suicidal thoughts, knife to cut himself, drug and alcohol abuse  Discharge Diagnoses: Active Problems:  Cocaine dependence  Alcohol abuse  Major depression  Axis Diagnosis:  AXIS I:  Alcohol Abuse, Major Depression, single episode and Substance Abuse AXIS II:  Deferred AXIS III:   Past Medical History  Diagnosis Date  . Diabetes mellitus without complication    AXIS IV:  economic problems, housing problems, occupational problems, other psychosocial or environmental problems and problems related to social environment AXIS V:  61-70 mild symptoms  Level of Care:  OP  Hospital Course:   Patient attended individual and group therapy while inpatient along with attending AA groups, one-one time with MD daily, medications for detox managed during inpatient, follow-up appointments made prior to discharge   Consults:  None  Significant Diagnostic Studies:  labs: completed and reviewed in ED, stable  Discharge Vitals:   Blood pressure 131/75, pulse 93, temperature 97.3 F (36.3 C), temperature source Oral, resp. rate 16. Lab Results:   Results for orders placed during the hospital encounter of 03/01/12 (from the past 72 hour(s))  GLUCOSE, CAPILLARY     Status: Abnormal   Collection Time   03/03/12 12:17 PM      Component Value Range Comment   Glucose-Capillary 206 (*) 70 - 99 mg/dL   GLUCOSE, CAPILLARY     Status: Abnormal   Collection Time   03/03/12  5:04 PM      Component Value Range Comment   Glucose-Capillary 268 (*) 70 - 99 mg/dL   GLUCOSE, CAPILLARY     Status: Abnormal   Collection Time   03/03/12  9:22 PM      Component Value Range Comment   Glucose-Capillary 181 (*) 70 - 99 mg/dL   GLUCOSE, CAPILLARY     Status: Abnormal   Collection Time   03/04/12  6:03 AM      Component Value Range Comment   Glucose-Capillary 203 (*) 70 - 99 mg/dL    Comment 1 Notify RN      Comment 2 Documented in Chart     GLUCOSE, CAPILLARY     Status: Abnormal   Collection Time   03/04/12 11:57 AM      Component Value Range Comment   Glucose-Capillary 330 (*) 70 - 99 mg/dL    Comment 1 Notify RN     GLUCOSE, CAPILLARY     Status: Abnormal   Collection Time   03/04/12  5:05 PM      Component Value Range Comment   Glucose-Capillary 216 (*) 70 - 99 mg/dL   GLUCOSE, CAPILLARY     Status: Normal   Collection Time   03/04/12  9:26 PM      Component Value Range Comment   Glucose-Capillary 79  70 - 99 mg/dL    Comment 1 Notify RN      Comment 2 Documented in Chart     GLUCOSE, CAPILLARY     Status: Abnormal   Collection Time   03/05/12  6:03 AM      Component Value Range Comment   Glucose-Capillary 273 (*) 70 - 99 mg/dL    Comment 1 Notify RN  GLUCOSE, CAPILLARY     Status: Abnormal   Collection Time   03/05/12 11:29 AM      Component Value Range Comment   Glucose-Capillary 213 (*) 70 - 99 mg/dL   GLUCOSE, CAPILLARY     Status: Abnormal   Collection Time   03/05/12  4:43 PM      Component Value Range Comment   Glucose-Capillary 275 (*) 70 - 99 mg/dL    Comment 1 Notify RN     GLUCOSE, CAPILLARY     Status: Normal   Collection Time   03/05/12  9:16 PM      Component Value Range Comment   Glucose-Capillary 94  70 - 99 mg/dL    Comment 1 Notify RN      Comment 2 Documented in Chart     GLUCOSE, CAPILLARY     Status: Abnormal   Collection Time   03/06/12  5:52 AM      Component Value Range Comment   Glucose-Capillary 191 (*) 70 - 99 mg/dL    Comment 1 Notify RN       Physical Findings: AIMS: Facial and Oral Movements Muscles of Facial Expression: None, normal Lips and Perioral Area: None, normal Jaw: None, normal Tongue:  None, normal,Extremity Movements Upper (arms, wrists, hands, fingers): None, normal Lower (legs, knees, ankles, toes): None, normal, Trunk Movements Neck, shoulders, hips: None, normal, Overall Severity Severity of abnormal movements (highest score from questions above): None, normal Incapacitation due to abnormal movements: None, normal Patient's awareness of abnormal movements (rate only patient's report): No Awareness, Dental Status Current problems with teeth and/or dentures?: No Does patient usually wear dentures?: No  CIWA:  CIWA-Ar Total: 0  COWS:  COWS Total Score: 1   Mental Status Exam: See Mental Status Examination and Suicide Risk Assessment completed by Attending Physician prior to discharge.  Discharge destination:  Home  Is patient on multiple antipsychotic therapies at discharge:  No   Has Patient had three or more failed trials of antipsychotic monotherapy by history:  No Recommended Plan for Multiple Antipsychotic Therapies:  NA  Discharge Orders    Future Orders Please Complete By Expires   Diet - low sodium heart healthy      Activity as tolerated - No restrictions          Medication List     As of 03/06/2012  9:19 AM    TAKE these medications      Indication    insulin aspart protamine-insulin aspart (70-30) 100 UNIT/ML injection   Commonly known as: NOVOLOG 70/30   Inject 16 Units into the skin daily with supper.    Indication: Type 2 Diabetes      metFORMIN 1000 MG tablet   Commonly known as: GLUCOPHAGE   Take 1 tablet (1,000 mg total) by mouth 2 (two) times daily with a meal.    Indication: Type 2 Diabetes      multivitamin with minerals Tabs   Take 1 tablet by mouth daily.    Indication: vitamin deficiency      thiamine 100 MG tablet   Take 1 tablet (100 mg total) by mouth daily.    Indication: Deficiency in Thiamine or Vitamin B1      traZODone 50 MG tablet   Commonly known as: DESYREL   Take 1 tablet (50 mg total) by mouth at bedtime  as needed for sleep (may repeat x 1 if not effective in one hour).    Indication: Trouble Sleeping  Follow-up recommendations:  Activity as tolerated, low-sodium heart healthy diet  Comments:  Patient denied suicidal/homicidal ideations and auditory/visual hallucinations, follow-up appointments encouraged to attend, outside support groups encouraged and information given   Signed: Nanine Means, PMH-NP 03/06/2012, 9:19 AM

## 2012-03-06 NOTE — [facility] Suicide Risk Assessment (Signed)
Suicide Risk Assessment  Discharge Assessment     Demographic Factors:  Male  Mental Status Per Nursing Assessment::   On Admission:     Current Mental Status by Physician: In full contact with reality. There are no suicidal ideas, plans or intent. He is committed to his recovery.    Loss Factors: NA  Historical Factors: NA  Risk Reduction Factors:  He is committed to abstinence, he is going back home, he is responsible for his kids, he will be working some   Continued Clinical Symptoms:  Depression:   Comorbid alcohol abuse/dependence  Cognitive Features That Contribute To Risk: None Identified   Suicide Risk:  Minimal: No identifiable suicidal ideation.  Patients presenting with no risk factors but with morbid ruminations; may be classified as minimal risk based on the severity of the depressive symptoms  Discharge Diagnoses:   AXIS I:  Cocaine dependence, Alcohol abuse, Major Depression AXIS II:  Deferred AXIS III:   Past Medical History  Diagnosis Date  . Diabetes mellitus without complication    AXIS IV:  None identified AXIS V:  61-70 mild symptoms  Plan Of Care/Follow-up recommendations:  Activity:  As tolerated Diet:  Diabetic Will follow up at [facility]  Is patient on multiple antipsychotic therapies at discharge:  No   Has Patient had three or more failed trials of antipsychotic monotherapy by history:  No  Recommended Plan for Multiple Antipsychotic Therapies:N/A   Shakyla Nolley A 03/06/2012, 1:27 PM

## 2012-03-06 NOTE — Progress Notes (Signed)
D/C instructions/meds/follow-up appointments reviewed, pt verbalized understanding, pt's belongings returned to pt, samples given, bus pas given.

## 2012-03-06 NOTE — Progress Notes (Signed)
[facility] Case Management Discharge Plan:  Will you be returning to the same living situation after discharge: Yes,    At discharge, do you have transportation home?:Yes,    Do you have the ability to pay for your medications:Yes,     Interagency Information:     Release of information consent forms completed and in the chart;  Patient's signature needed at discharge.  Patient to Follow up at:  Follow-up Information    Follow up with [facility]. On 03/08/2012. (Appt scheduled on Wednesday, 03/08/12 at 8:00am)    Contact information:   [address] [place] [address] [phone]         Patient denies SI/HI:   Yes,       Safety Planning and Suicide Prevention discussed:  Yes,     Barrier to discharge identified:No.     Aris [place] 03/06/2012, 1:06 PM

## 2012-03-07 NOTE — Progress Notes (Signed)
Patient Discharge Instructions:  After Visit Summary (AVS):   Faxed to:  03/07/12 Psychiatric Admission Assessment Note:   Faxed to:  03/07/12 Suicide Risk Assessment - Discharge Assessment:   Faxed to:  03/07/12 Faxed/Sent to the Next Level Care provider:  03/07/12 Faxed to [facility] @ [phone]  Jerelene Redden, 03/07/2012, 3:57 PM

## 2012-03-17 NOTE — Discharge Summary (Signed)
Agree with assesment and plan  Madie Reno A. Dub Mikes, M.D.

## 2012-12-06 ENCOUNTER — Encounter (HOSPITAL_COMMUNITY): Payer: Self-pay | Admitting: Emergency Medicine

## 2012-12-06 ENCOUNTER — Encounter (HOSPITAL_COMMUNITY): Payer: Self-pay | Admitting: *Deleted

## 2012-12-06 ENCOUNTER — Emergency Department (HOSPITAL_COMMUNITY)
Admission: EM | Admit: 2012-12-06 | Discharge: 2012-12-07 | Disposition: A | Discharge status: Other Acute Inpatient Hospital | Payer: Self-pay | Attending: Emergency Medicine | Admitting: Emergency Medicine

## 2012-12-06 ENCOUNTER — Ambulatory Visit (HOSPITAL_COMMUNITY)
Admission: RE | Admit: 2012-12-06 | Discharge: 2012-12-06 | Disposition: A | Discharge status: Home / Self Care | Payer: Self-pay | Attending: Psychiatry | Admitting: Psychiatry

## 2012-12-06 DIAGNOSIS — F1420 Cocaine dependence, uncomplicated: Secondary | ICD-10-CM | POA: Insufficient documentation

## 2012-12-06 DIAGNOSIS — F39 Unspecified mood [affective] disorder: Secondary | ICD-10-CM | POA: Insufficient documentation

## 2012-12-06 DIAGNOSIS — Z794 Long term (current) use of insulin: Secondary | ICD-10-CM | POA: Insufficient documentation

## 2012-12-06 DIAGNOSIS — F3289 Other specified depressive episodes: Secondary | ICD-10-CM | POA: Insufficient documentation

## 2012-12-06 DIAGNOSIS — F1010 Alcohol abuse, uncomplicated: Secondary | ICD-10-CM

## 2012-12-06 DIAGNOSIS — F1020 Alcohol dependence, uncomplicated: Secondary | ICD-10-CM | POA: Insufficient documentation

## 2012-12-06 DIAGNOSIS — Z79899 Other long term (current) drug therapy: Secondary | ICD-10-CM | POA: Insufficient documentation

## 2012-12-06 DIAGNOSIS — E119 Type 2 diabetes mellitus without complications: Secondary | ICD-10-CM | POA: Insufficient documentation

## 2012-12-06 DIAGNOSIS — F1410 Cocaine abuse, uncomplicated: Secondary | ICD-10-CM

## 2012-12-06 DIAGNOSIS — R739 Hyperglycemia, unspecified: Secondary | ICD-10-CM

## 2012-12-06 DIAGNOSIS — F17200 Nicotine dependence, unspecified, uncomplicated: Secondary | ICD-10-CM | POA: Insufficient documentation

## 2012-12-06 DIAGNOSIS — F329 Major depressive disorder, single episode, unspecified: Secondary | ICD-10-CM | POA: Insufficient documentation

## 2012-12-06 LAB — URINALYSIS, ROUTINE W REFLEX MICROSCOPIC
Bilirubin Urine: NEGATIVE
Glucose, UA: >1000 mg/dL — AB
Ketones, ur: NEGATIVE mg/dL
Leukocytes, UA: NEGATIVE
Nitrite: NEGATIVE
Protein, ur: NEGATIVE mg/dL
pH: 6 (ref 5.0–8.0)

## 2012-12-06 LAB — RAPID URINE DRUG SCREEN, HOSP PERFORMED
Barbiturates: NOT DETECTED
Benzodiazepines: NOT DETECTED
Cocaine: POSITIVE — AB
Tetrahydrocannabinol: NOT DETECTED

## 2012-12-06 LAB — BASIC METABOLIC PANEL
BUN: 15 mg/dL (ref 6–23)
Calcium: 9.3 mg/dL (ref 8.4–10.5)
Creatinine, Ser: 0.9 mg/dL (ref 0.50–1.35)
GFR calc Af Amer: >90 mL/min (ref >90)

## 2012-12-06 LAB — URINE MICROSCOPIC-ADD ON: Urine-Other: NONE SEEN

## 2012-12-06 LAB — CBC
Hemoglobin: 14.1 g/dL (ref 13.0–17.0)
MCHC: 34.3 g/dL (ref 30.0–36.0)
Platelets: 257 10*3/uL (ref 150–400)
RBC: 4.82 MIL/uL (ref 4.22–5.81)
WBC: 3.9 10*3/uL — ABNORMAL LOW (ref 4.0–10.5)

## 2012-12-06 MED ORDER — LORAZEPAM 1 MG PO TABS
1.0000 mg | ORAL_TABLET | Freq: Four times a day (QID) | ORAL | Status: DC | PRN
  Filled 2012-12-06: qty 1

## 2012-12-06 MED ORDER — FOLIC ACID 1 MG PO TABS
1.0000 mg | ORAL_TABLET | Freq: Every day | ORAL | Status: DC
  Administered 2012-12-06: 1 mg via ORAL
  Filled 2012-12-06: qty 1

## 2012-12-06 MED ORDER — SODIUM CHLORIDE 0.9 % IV SOLN
1000.0000 mL | INTRAVENOUS | Status: DC
  Administered 2012-12-06: 1000 mL via INTRAVENOUS

## 2012-12-06 MED ORDER — SODIUM CHLORIDE 0.9 % IV SOLN
1000.0000 mL | Freq: Once | INTRAVENOUS | Status: AC
  Administered 2012-12-06: 1000 mL via INTRAVENOUS

## 2012-12-06 MED ORDER — VITAMIN B-1 100 MG PO TABS
100.0000 mg | ORAL_TABLET | Freq: Every day | ORAL | Status: DC
  Administered 2012-12-06: 100 mg via ORAL
  Filled 2012-12-06: qty 1

## 2012-12-06 MED ORDER — INSULIN ASPART PROT & ASPART (70-30 MIX) 100 UNIT/ML ~~LOC~~ SUSP
10.0000 [IU] | Freq: Every day | SUBCUTANEOUS | Status: DC
  Filled 2012-12-06: qty 10

## 2012-12-06 MED ORDER — METFORMIN HCL 500 MG PO TABS
1000.0000 mg | ORAL_TABLET | Freq: Two times a day (BID) | ORAL | Status: DC
  Filled 2012-12-06: qty 2

## 2012-12-06 MED ORDER — INSULIN ASPART 100 UNIT/ML ~~LOC~~ SOLN: 0.0000 [IU] | Freq: Three times a day (TID) | SUBCUTANEOUS | Status: DC

## 2012-12-06 MED ORDER — THIAMINE HCL 100 MG/ML IJ SOLN: 100.0000 mg | Freq: Every day | INTRAMUSCULAR | Status: DC

## 2012-12-06 MED ORDER — SODIUM CHLORIDE 0.9 % IV SOLN
INTRAVENOUS | Status: DC
  Filled 2012-12-06: qty 1

## 2012-12-06 MED ORDER — LORAZEPAM 1 MG PO TABS: 0.0000 mg | ORAL_TABLET | Freq: Two times a day (BID) | ORAL | Status: DC

## 2012-12-06 MED ORDER — LORAZEPAM 2 MG/ML IJ SOLN: 1.0000 mg | Freq: Four times a day (QID) | INTRAMUSCULAR | Status: DC | PRN

## 2012-12-06 MED ORDER — LORAZEPAM 1 MG PO TABS
0.0000 mg | ORAL_TABLET | Freq: Four times a day (QID) | ORAL | Status: DC
  Administered 2012-12-06: 1 mg via ORAL

## 2012-12-06 MED ORDER — ADULT MULTIVITAMIN W/MINERALS CH
1.0000 | ORAL_TABLET | Freq: Every day | ORAL | Status: DC
  Administered 2012-12-06: 1 via ORAL
  Filled 2012-12-06: qty 1

## 2012-12-06 NOTE — ED Notes (Signed)
Pt's CBG is 359

## 2012-12-06 NOTE — ED Notes (Signed)
Pt presents to the ED with a complaint of medical clearance.  Pt had a period of sobriety.  Pt has had a relapse with cocaine.  Pt has been abusing for the last 5 days.  Pt does had suicidal ideations but did not go through with drinking draino or clorox.  Pt has stated he has lost a considerable amount of weight.

## 2012-12-06 NOTE — BH Assessment (Addendum)
Assessment Note  James [name] is a 52 y.o. married black male.  He presents at the Outpatient Clinic at [facility], and was escorted to Therapeutic Triage.  He reports that he has been on a cocaine and alcohol binge for the past week, and he fears that it may result in his wife of the past 32 years leaving him.  Pt had to be escorted into the home by police today in order to retrieve personal belongings because this wife would not let him enter.  Pt also endorses depression and SI.  Lethality: Suicidality: Pt reports that today he considered walking into traffic to end his life.  He also at one point today had a bottle of Drano and was considering ingesting it in a suicide attempt.  He reports that this is because of his addiction problem and the effects that it has on his marriage and his relationship with his children.  He states, "If I have to live like this, I just don't want to live no more."  He reports that he has asked God "to take me in my sleep."  He reports that immediately prior to his admission to [facility] in 02/2012 he made two attempts, one by cutting his wrist and the other by running into traffic.  Pt endorses depressed mood with symptoms noted in the "risk to self" assessment below. Homicidality: Pt denies homicidal thoughts or physical aggression.  He denies having access to firearms.  He denies having any legal problems at this time.  He is calm and cooperative during assessment. Psychosis: Pt reports that recently, but only while intoxicated, he has experienced AH with command to "get yourself more coke."  However, pt denies any hallucinations at the time of this assessment.  Pt does not appear to be responding to internal stimuli and exhibits no delusional thought.  Pt's reality testing appears to be intact. Substance Abuse: Pt reports recurrent problems with addiction to cocaine and alcohol.  He reports that he takes no pleasure in using these substances, but feels compelled to use them.  Pt  had borrowed his spouse's car to take his daughter to traffic court on 11/29/2012.  He instead went on the binge.  He reports using $50 or more of cocaine, either powder by insufflation, or crack by smoking, on a daily basis for the past 7 days.  He also has been drinking $20 worth of beer or liquor on a daily basis during this same period.  Most recent use of both was yesterday, 12/05/2012.  Pt does not appear to be intoxicated or in withdrawal at this time, but he is very drowsy and on several occasions fell asleep during the assessment.  This is consistent with his report that he has not been sleeping over the past week.  Pt endorses mild withdrawal symptoms at this time, as documented in the "Additional Social History" section below, with a cumulative CIWA score of 5.  He denies any history of seizures or DT's in withdrawal.  Social supports: Pt is uncertain whether his current binge will result in the termination of his marriage.  He identifies a brother and a sister as possible supports, as well as the friend with whom he stayed last night.  Treatment history: As mentioned above, pt was admitted to [facility] in 02/2012, his only history of inpatient treatment.  He was to follow up with [facility], but did not feel that they provided appropriate care for him and discontinued service.  He denies any other treatment  history and has never attended 12-Step meetings.  Today he is requesting admission to [facility].  Axis I: Mood Disorder NOS 296.90; Alcohol Dependence 303.90; Cocaine Dependence 304.20 Axis II: Deferred 799.9 Axis III:  Past Medical History  Diagnosis Date  . Diabetes mellitus without complication    Axis IV: economic problems, housing problems, occupational problems, problems related to social environment, problems with access to health care services and problems with primary support group Axis V: GAF = 30  Past Medical History:  Past Medical History  Diagnosis Date  . Diabetes mellitus without  complication     Past Surgical History  Procedure Laterality Date  . Finger surgery      Family History: No family history on file.  Social History:  reports that he has been smoking Cigarettes.  He has a 23 pack-year smoking history. He does not have any smokeless tobacco history on file. He reports that he drinks about 37.2 ounces of alcohol per week. He reports that he uses illicit drugs (Cocaine and "Crack" cocaine) about 3 times per week.  Additional Social History:  Alcohol / Drug Use Pain Medications: Denies Prescriptions: Denies Over the Counter: Denies History of alcohol / drug use?:  (Pt recently offered, but refused heroin.) Longest period of sobriety (when/how long): 5 years, ending 3 - 4 years ago. Negative Consequences of Use: Financial;Legal;Personal relationships;Work / Mining engineer #1 Name of Substance 1: Cocaine (crack or powder) 1 - Age of First Use: 52 y/o 1 - Amount (size/oz): $50 or more 1 - Frequency: Daily 1 - Duration: 7 days  1 - Last Use / Amount: 12/05/2012 Substance #2 Name of Substance 2: Alcohol (beer or liquor) 2 - Age of First Use: 52 y/o 2 - Amount (size/oz): $20 or more 2 - Frequency: Daily 2 - Duration: 7 days  2 - Last Use / Amount: 12/05/2012  CIWA: CIWA-Ar Nausea and Vomiting: mild nausea with no vomiting Tactile Disturbances: none Tremor: not visible, but can be felt fingertip to fingertip Auditory Disturbances: not present Paroxysmal Sweats: barely perceptible sweating, palms moist Visual Disturbances: very mild sensitivity Anxiety: mildly anxious Headache, Fullness in Head: none present Agitation: normal activity Orientation and Clouding of Sensorium: oriented and can do serial additions CIWA-Ar Total: 5 COWS:    Allergies: No Known Allergies  Home Medications:  (Not in a hospital admission)  OB/GYN Status:  No LMP for male patient.  General Assessment Data Location of Assessment: [facility] Is this a  Tele or Face-to-Face Assessment?: Face-to-Face Is this an Initial Assessment or a Re-assessment for this encounter?: Initial Assessment Living Arrangements: Spouse/significant other;Children Can pt return to current living arrangement?: No Admission Status: Voluntary Is patient capable of signing voluntary admission?: Yes Transfer from: Home Referral Source: Self/Family/Friend  Medical Screening Exam [facility] Walk-in ONLY) Medical Exam completed: No Reason for MSE not completed: Other: (To be transferred to [facility] for medical clearance/holding.)  [facility] Crisis Care Plan Living Arrangements: Spouse/significant other;Children Name of Psychiatrist: None Name of Therapist: None  Education Status Is patient currently in school?: No  Risk to self Suicidal Ideation: Yes-Currently Present Suicidal Intent: Yes-Currently Present Is patient at risk for suicide?: Yes Suicidal Plan?: Yes-Currently Present Specify Current Suicidal Plan: Considered walking into traffic; had Drano in hand, planning to ingest today. Access to Means: Yes Specify Access to Suicidal Means: Traffic, Drano What has been your use of drugs/alcohol within the last 12 months?: Alcohol & cocaine binge over past week. Previous Attempts/Gestures: Yes How many times?: 2 (  Cut wrists, walked into traffic prior to 02/2012 admission) Other Self Harm Risks: "If I have to live like this (with addiction), I just don't want to live no more." Asks God "to take me in my sleep." Triggers for Past Attempts: Other (Comment) (Addiction with concurrent marital problems.) Intentional Self Injurious Behavior: None Family Suicide History: No Recent stressful life event(s): Conflict (Comment);Other (Comment);Financial Problems (Drug binge with concurrent marital conflict; unemployed.) Persecutory voices/beliefs?: No Depression: Yes Depression Symptoms: Tearfulness;Isolating;Fatigue;Guilt;Feeling angry/irritable;Feeling worthless/self pity;Loss of  interest in usual pleasures Substance abuse history and/or treatment for substance abuse?: Yes (Alcohol & cocaine binge over past week.) Suicide prevention information given to non-admitted patients: Yes  Risk to Others Homicidal Ideation: No Thoughts of Harm to Others: No Current Homicidal Intent: No Current Homicidal Plan: No Access to Homicidal Means: No Identified Victim: None History of harm to others?: No Assessment of Violence: None Noted Violent Behavior Description: Calm/cooperative Does patient have access to weapons?: No (Denies having firearms.) Criminal Charges Pending?: No Does patient have a court date: No  Psychosis Hallucinations: None noted (AH w/ command to "get yourself more coke" during binge.) Delusions: None noted  Mental Status Report Appear/Hygiene: Other (Comment) (Casual) Eye Contact: Poor Motor Activity: Unremarkable Speech: Other (Comment) (Unremarkable) Level of Consciousness: Drowsy (Nodded off during assessment a couple times.) Mood: Anxious;Depressed Affect: Blunted Anxiety Level: Minimal Thought Processes: Coherent;Circumstantial Judgement: Unimpaired Orientation: Person;Place;Time;Situation Obsessive Compulsive Thoughts/Behaviors: None  Cognitive Functioning Concentration: Decreased Memory: Recent Intact;Remote Intact IQ: Average Insight: Good Impulse Control: Poor Appetite: Poor (Reported Ht: 6\' 0" ; Reported Wt: 140#) Weight Loss: 35 (30 - 40# over past 2 years.) Weight Gain: 0 Sleep: Decreased Total Hours of Sleep: 0 (None over past 6 days.) Vegetative Symptoms: Not bathing;Decreased grooming (Poor over past 6 days.)  ADLScreening [facility] Assessment Services) Patient's cognitive ability adequate to safely complete daily activities?: Yes Patient able to express need for assistance with ADLs?: Yes Independently performs ADLs?: Yes (appropriate for developmental age)  Prior Inpatient Therapy Prior Inpatient Therapy: Yes Prior  Therapy Dates: 02/2012: [facility] for SI & substance abuse  Prior Outpatient Therapy Prior Outpatient Therapy: Yes Prior Therapy Dates: Was to follow up with [facility], but pt reports receiving poor service and discontinuing.  ADL Screening (condition at time of admission) Patient's cognitive ability adequate to safely complete daily activities?: Yes Is the patient deaf or have difficulty hearing?: No Does the patient have difficulty seeing, even when wearing glasses/contacts?: No Does the patient have difficulty concentrating, remembering, or making decisions?: No Patient able to express need for assistance with ADLs?: Yes Does the patient have difficulty dressing or bathing?: No Independently performs ADLs?: Yes (appropriate for developmental age) Does the patient have difficulty walking or climbing stairs?: No Weakness of Legs: None Weakness of Arms/Hands: None  Home Assistive Devices/Equipment Home Assistive Devices/Equipment: CBG Meter;Eyeglasses    Abuse/Neglect Assessment (Assessment to be complete while patient is alone) Physical Abuse: Denies Verbal Abuse: Denies Sexual Abuse: Denies Exploitation of patient/patient's resources: Denies Self-Neglect: Denies     Merchant navy officer (For Healthcare) Advance Directive: Patient does not have advance directive;Patient would like information Patient requests advance directive information: Advance directive packet given Pre-existing out of facility DNR order (yellow form or pink MOST form): No Nutrition Screen- [facility] Adult/WL/AP Patient's home diet: Carb modified  Additional Information 1:1 In Past 12 Months?: No CIRT Risk: No Elopement Risk: No Does patient have medical clearance?: No     Disposition:  Disposition Initial Assessment Completed for this Encounter: Yes Disposition of  Patient: Referred to Patient referred to: Other (Comment) (Transfer to [facility] for medical clearance and holding.) After reviewing pt with Serena Colonel,  NP, it has been determined that he currently presents a life threatening danger to himself, requiring psychiatric hospitalization.  However, pt first requires medical clearance.  Pt to be transferred to [facility] for medical clearance and holding; it is anticipated that a suitable bed will be available at [facility] today.  At 14:46 I spoke to Diane, Charity fundraiser, the charge nurse at [facility] and gave report.  At 14:49 I notified Ava Elisabeth Most, Triage Specialist.  At 14:50 I called Security to arrange for transportation.  Pt departed by Security around 15:30.  On Site Evaluation by:   Reviewed with Physician:  Serena Colonel, NP @ 14:25  Doylene Canning, MA Triage Specialist Raphael Gibney 12/06/2012 4:46 PM

## 2012-12-06 NOTE — Treatment Plan (Signed)
Pt accepted to [facility] pending blood glucose level stabilization over the next 12 hours.  If blood glucose levels remain WNL overnight, pt can be admitted to [facility] tomorrow morning.

## 2012-12-06 NOTE — ED Notes (Signed)
cbg 169

## 2012-12-06 NOTE — ED Notes (Signed)
Pt belongings consistent of a pair of pants, a shirt, underwear and a pair of flip flops where put in [address] in the TCU.  A wallet was in the pants but it contained only a license and no money or credit cards

## 2012-12-06 NOTE — ED Provider Notes (Addendum)
CSN: [id number]     Arrival date & time 12/06/12  1552 History   First MD Initiated Contact with Patient 12/06/12 1619     Chief Complaint  Patient presents with  . Medical Clearance   (Consider location/radiation/quality/duration/timing/severity/associated sxs/prior Treatment) HPI Comments: Patient presents to the ER for evaluation and help with drug and alcohol addiction. Patient reports that he has had a long history of alcohol and cocaine abuse. Patient reports that he has been clean, but relapsed a week ago. He has been using excessively and has not been taking any of his medications. He denies chest pain and shortness of breath. He has been feeling depressed, but has not had any thoughts of harming himself or anyone else.   Past Medical History  Diagnosis Date  . Diabetes mellitus without complication    Past Surgical History  Procedure Laterality Date  . Finger surgery     History reviewed. No pertinent family history. History  Substance Use Topics  . Smoking status: Current Every Day Smoker -- 1.00 packs/day for 23 years    Types: Cigarettes  . Smokeless tobacco: Not on file  . Alcohol Use: 37.2 oz/week    12 Cans of beer, 50 Shots of liquor per week    Review of Systems  Respiratory: Negative.   Cardiovascular: Negative.   Psychiatric/Behavioral: Positive for dysphoric mood. Negative for suicidal ideas and self-injury.  All other systems reviewed and are negative.    Allergies  Review of patient's allergies indicates no known allergies.  Home Medications   Current Outpatient Rx  Name  Route  Sig  Dispense  Refill  . insulin aspart protamine- aspart (NOVOLOG MIX 70/30) (70-30) 100 UNIT/ML injection   Subcutaneous   Inject 10 Units into the skin daily with supper.         . metFORMIN (GLUCOPHAGE) 1000 MG tablet   Oral   Take 1 tablet (1,000 mg total) by mouth 2 (two) times daily with a meal.   60 tablet   0    BP 130/64  Pulse 108  Temp(Src) 98.3 F  (36.8 C) (Oral)  Resp 20  SpO2 99% Physical Exam  Constitutional: He is oriented to person, place, and time. He appears well-developed and well-nourished. No distress.  HENT:  Head: Normocephalic and atraumatic.  Right Ear: Hearing normal.  Left Ear: Hearing normal.  Nose: Nose normal.  Mouth/Throat: Oropharynx is clear and moist and mucous membranes are normal.  Eyes: Conjunctivae and EOM are normal. Pupils are equal, round, and reactive to light.  Neck: Normal range of motion. Neck supple.  Cardiovascular: Regular rhythm, S1 normal and S2 normal.  Exam reveals no gallop and no friction rub.   No murmur heard. Pulmonary/Chest: Effort normal and breath sounds normal. No respiratory distress. He exhibits no tenderness.  Abdominal: Soft. Normal appearance and bowel sounds are normal. There is no hepatosplenomegaly. There is no tenderness. There is no rebound, no guarding, no tenderness at McBurney's point and negative Murphy's sign. No hernia.  Musculoskeletal: Normal range of motion.  Neurological: He is alert and oriented to person, place, and time. He has normal strength. No cranial nerve deficit or sensory deficit. Coordination normal. GCS eye subscore is 4. GCS verbal subscore is 5. GCS motor subscore is 6.  Skin: Skin is warm, dry and intact. No rash noted. No cyanosis.  Psychiatric: He has a normal mood and affect. His speech is normal and behavior is normal. Thought content normal.    ED Course  Procedures (including critical care time) Labs Review Labs Reviewed  CBC - Abnormal; Notable for the following:    WBC 3.9 (*)    All other components within normal limits  BASIC METABOLIC PANEL - Abnormal; Notable for the following:    Sodium 129 (*)    Chloride 92 (*)    Glucose, Bld 536 (*)    All other components within normal limits  URINALYSIS, ROUTINE W REFLEX MICROSCOPIC - Abnormal; Notable for the following:    Specific Gravity, Urine 1.041 (*)    Glucose, UA >1000 (*)     All other components within normal limits  URINE RAPID DRUG SCREEN (HOSP PERFORMED) - Abnormal; Notable for the following:    Cocaine POSITIVE (*)    All other components within normal limits  ETHANOL  URINE MICROSCOPIC-ADD ON   Imaging Review No results found.  MDM  Diagnosis: 1. Hyperglycemia 2. Drug and alcohol abuse  Presented with complaints of relapse of alcoholism and cocaine abuse. He does have a history of diabetes and has been off of his medications. He was found to be profoundly hyperglycemic without any evidence of ketosis or hyperosmolar state. The patient has had his blood sugar corrected. The remainder of his workup was unremarkable. Patient is medically cleared for psychiatric treatment. Will be placed on alcohol withdrawal protocol, sliding scale insulin monitoring.  Addendum: Informed by social work and that the patient has been accepted to behavioral Hospital, but needs his blood sugars to be controlled for 12 hours before they will accept him. Patient to be maintained on insulin sliding scale, transfer to behavioral health Hospital in the morning if blood sugars maintained.  Gilda Crease, MD 12/06/12 4098  Gilda Crease, MD 12/06/12 2108

## 2012-12-07 ENCOUNTER — Encounter (HOSPITAL_COMMUNITY): Payer: Self-pay | Admitting: *Deleted

## 2012-12-07 ENCOUNTER — Inpatient Hospital Stay (HOSPITAL_COMMUNITY)
Admission: EM | Admit: 2012-12-07 | Discharge: 2012-12-11 | DRG: 897 | Disposition: A | Discharge status: Home / Self Care | Payer: Self-pay | Source: Intra-hospital | Attending: Psychiatry | Admitting: Psychiatry

## 2012-12-07 DIAGNOSIS — F10939 Alcohol use, unspecified with withdrawal, unspecified: Principal | ICD-10-CM

## 2012-12-07 DIAGNOSIS — Z794 Long term (current) use of insulin: Secondary | ICD-10-CM

## 2012-12-07 DIAGNOSIS — F332 Major depressive disorder, recurrent severe without psychotic features: Secondary | ICD-10-CM | POA: Diagnosis present

## 2012-12-07 DIAGNOSIS — F1420 Cocaine dependence, uncomplicated: Secondary | ICD-10-CM | POA: Diagnosis present

## 2012-12-07 DIAGNOSIS — F1020 Alcohol dependence, uncomplicated: Secondary | ICD-10-CM | POA: Diagnosis present

## 2012-12-07 DIAGNOSIS — E119 Type 2 diabetes mellitus without complications: Secondary | ICD-10-CM | POA: Diagnosis present

## 2012-12-07 DIAGNOSIS — F10239 Alcohol dependence with withdrawal, unspecified: Principal | ICD-10-CM | POA: Diagnosis present

## 2012-12-07 DIAGNOSIS — F17200 Nicotine dependence, unspecified, uncomplicated: Secondary | ICD-10-CM | POA: Diagnosis present

## 2012-12-07 DIAGNOSIS — F1994 Other psychoactive substance use, unspecified with psychoactive substance-induced mood disorder: Secondary | ICD-10-CM | POA: Diagnosis present

## 2012-12-07 DIAGNOSIS — F1010 Alcohol abuse, uncomplicated: Secondary | ICD-10-CM

## 2012-12-07 DIAGNOSIS — F329 Major depressive disorder, single episode, unspecified: Secondary | ICD-10-CM | POA: Diagnosis present

## 2012-12-07 LAB — GLUCOSE, CAPILLARY
Glucose-Capillary: 169 mg/dL — ABNORMAL HIGH (ref 70–99)
Glucose-Capillary: 187 mg/dL — ABNORMAL HIGH (ref 70–99)
Glucose-Capillary: 249 mg/dL — ABNORMAL HIGH (ref 70–99)
Glucose-Capillary: 260 mg/dL — ABNORMAL HIGH (ref 70–99)
Glucose-Capillary: 267 mg/dL — ABNORMAL HIGH (ref 70–99)

## 2012-12-07 LAB — HEMOGLOBIN A1C: Mean Plasma Glucose: 272 mg/dL — ABNORMAL HIGH (ref <117)

## 2012-12-07 MED ORDER — BUPROPION HCL ER (XL) 150 MG PO TB24
150.0000 mg | ORAL_TABLET | Freq: Every day | ORAL | Status: DC
  Administered 2012-12-07 – 2012-12-11 (×5): 150 mg via ORAL
  Filled 2012-12-07 (×7): qty 1

## 2012-12-07 MED ORDER — INSULIN ASPART PROT & ASPART (70-30 MIX) 100 UNIT/ML ~~LOC~~ SUSP
10.0000 [IU] | Freq: Every day | SUBCUTANEOUS | Status: DC
  Administered 2012-12-07 – 2012-12-10 (×3): 10 [IU] via SUBCUTANEOUS
  Filled 2012-12-07: qty 0.1

## 2012-12-07 MED ORDER — TRAZODONE HCL 100 MG PO TABS
50.0000 mg | ORAL_TABLET | Freq: Every evening | ORAL | Status: DC | PRN
  Administered 2012-12-07 – 2012-12-10 (×5): 50 mg via ORAL
  Filled 2012-12-07 (×2): qty 1
  Filled 2012-12-07: qty 14
  Filled 2012-12-07 (×8): qty 1

## 2012-12-07 MED ORDER — INSULIN ASPART 100 UNIT/ML ~~LOC~~ SOLN
0.0000 [IU] | Freq: Every day | SUBCUTANEOUS | Status: DC
  Administered 2012-12-07: 2 [IU] via SUBCUTANEOUS

## 2012-12-07 MED ORDER — CHLORDIAZEPOXIDE HCL 25 MG PO CAPS
25.0000 mg | ORAL_CAPSULE | Freq: Four times a day (QID) | ORAL | Status: DC | PRN
  Administered 2012-12-09: 25 mg via ORAL
  Filled 2012-12-07: qty 1

## 2012-12-07 MED ORDER — MAGNESIUM HYDROXIDE 400 MG/5ML PO SUSP: 30.0000 mL | Freq: Every day | ORAL | Status: DC | PRN

## 2012-12-07 MED ORDER — INSULIN GLARGINE 100 UNIT/ML ~~LOC~~ SOLN
10.0000 [IU] | Freq: Every day | SUBCUTANEOUS | Status: DC
  Administered 2012-12-07 – 2012-12-10 (×4): 10 [IU] via SUBCUTANEOUS
  Filled 2012-12-07: qty 0.1

## 2012-12-07 MED ORDER — METFORMIN HCL 500 MG PO TABS
1000.0000 mg | ORAL_TABLET | Freq: Two times a day (BID) | ORAL | Status: DC
  Administered 2012-12-07 – 2012-12-11 (×9): 1000 mg via ORAL
  Filled 2012-12-07 (×12): qty 2

## 2012-12-07 MED ORDER — INSULIN ASPART 100 UNIT/ML ~~LOC~~ SOLN
0.0000 [IU] | Freq: Three times a day (TID) | SUBCUTANEOUS | Status: DC
  Administered 2012-12-07: 3 [IU] via SUBCUTANEOUS
  Administered 2012-12-07 (×2): 8 [IU] via SUBCUTANEOUS
  Administered 2012-12-08: 3 [IU] via SUBCUTANEOUS
  Administered 2012-12-08: 5 [IU] via SUBCUTANEOUS
  Administered 2012-12-09: 2 [IU] via SUBCUTANEOUS
  Administered 2012-12-09: 15 [IU] via SUBCUTANEOUS
  Administered 2012-12-09: 3 [IU] via SUBCUTANEOUS
  Administered 2012-12-10: 5 [IU] via SUBCUTANEOUS
  Administered 2012-12-10: 8 [IU] via SUBCUTANEOUS
  Administered 2012-12-10 – 2012-12-11 (×2): 3 [IU] via SUBCUTANEOUS

## 2012-12-07 MED ORDER — ALUM & MAG HYDROXIDE-SIMETH 200-200-20 MG/5ML PO SUSP: 30.0000 mL | ORAL | Status: DC | PRN

## 2012-12-07 MED ORDER — ACETAMINOPHEN 325 MG PO TABS: 650.0000 mg | ORAL_TABLET | Freq: Four times a day (QID) | ORAL | Status: DC | PRN

## 2012-12-07 NOTE — Tx Team (Signed)
Initial Interdisciplinary Treatment Plan  PATIENT STRENGTHS: (choose at least two) Ability for insight Average or above average intelligence Capable of independent living General fund of knowledge Motivation for treatment/growth  PATIENT STRESSORS: Financial difficulties Marital or family conflict Substance abuse   PROBLEM LIST: Problem List/Patient Goals Date to be addressed Date deferred Reason deferred Estimated date of resolution  Substance abuse-alcohol and cocaine      Depression-marital conflict      "I want to go to rehab for my addictions.  I got to get my life straightened out."      Risk for self harm                                     DISCHARGE CRITERIA:  Adequate post-discharge living arrangements Improved stabilization in mood, thinking, and/or behavior Motivation to continue treatment in a less acute level of care Verbal commitment to aftercare and medication compliance Withdrawal symptoms are absent or subacute and managed without 24-hour nursing intervention  PRELIMINARY DISCHARGE PLAN: Attend aftercare/continuing care group Outpatient therapy  PATIENT/FAMIILY INVOLVEMENT: This treatment plan has been presented to and reviewed with the patient, James [name], and/or family member.  The patient and family have been given the opportunity to ask questions and make suggestions.  Jesus Genera [facility] 12/07/2012, 3:45 AM

## 2012-12-07 NOTE — [facility] Group Notes (Signed)
[facility] LCSW Group Therapy  12/07/2012  1:15 PM   Type of Therapy:  Group Therapy  Participation Level:  Active  Participation Quality:  Appropriate and Attentive  Affect:  Appropriate  Cognitive:  Alert and Appropriate  Insight:  Developing/Improving and Engaged  Engagement in Therapy:  Developing/Improving and Engaged  Modes of Intervention:  Clarification, Confrontation, Discussion, Education, Exploration, Limit-setting, Orientation, Problem-solving, Rapport Building, Dance movement psychotherapist, Socialization and Support  Summary of Progress/Problems: The topic for group was balance in life.  Today's group focused on defining balance in one's own words, identifying things that can knock one off balance, and exploring healthy ways to maintain balance in life. Group members were asked to provide an example of a time when they felt off balance, describe how they handled that situation,and process healthier ways to regain balance in the future. Group members were asked to share the most important tool for maintaining balance that they learned while at [facility] and how they plan to apply this method after discharge.  Pt sat quietly through group before being pulled out by the doctor.    Reyes Ivan, LCSWA 12/07/2012 2:17 PM

## 2012-12-07 NOTE — Progress Notes (Signed)
[facility] Group Notes:  (Nursing/MHT/Case Management/Adjunct)  Date:  12/07/2012  Time:  10:33 PM  Type of Therapy:  Group Therapy  Participation Level:  Minimal  Participation Quality:  Appropriate  Affect:  Appropriate  Cognitive:  Appropriate  Insight:  Improving  Engagement in Group:  Developing/Improving  Modes of Intervention:  Activity, Socialization and Support  Summary of Progress/Problems: Pt. Participated in activity, and stated walking is a coping skill he uses for stress.  Sondra Come 12/07/2012, 10:33 PM

## 2012-12-07 NOTE — Progress Notes (Signed)
D: Pt has been participating appropriately in all unit activities. Affect sad & depressed mood.Pt is IDDM. CBG= 267 @ 1700. A : Pt was given 8 units of SSI.VSS. CIWA =1. Supported & encouraged. Continues on 15 minute checks. R: Pt safety maintained.

## 2012-12-07 NOTE — Progress Notes (Signed)
Adult Psychoeducational Group Note  Date:  12/07/2012 Time:  10:18 AM  Group Topic/Focus:  Alcoholics Anonymous  Participation Level:  Active  Participation Quality:  Appropriate  Affect:  Appropriate  Cognitive:  Alert  Insight: Appropriate  Engagement in Group:  Engaged  Modes of Intervention:  Discussion  Additional Comments:  Pt attended AA group with counselor. Pt was engaged and appropriate.  [place] Shi K 12/07/2012, 10:18 AM

## 2012-12-07 NOTE — Progress Notes (Signed)
Recreation Therapy Notes  Date: 08.28.2014 Time: 3:00pm Location: [address] Dayroom  Group Topic: Leisure Education  Goal Area(s) Addresses:  Patient will verbalize impact of positive leisure on sobriety. Patient will verbalize impact of leisure on self-esteem.  Behavioral Response: Passive LIstener  Intervention: Air traffic controller  Activity: Leisure Time Management. Patients were given an worksheet outlining approximately 16 hours of the day, using various colors patients were asked to identify how they use their time. For example red = time at work, blue = time for self-care, green = time for leisure  Education: [employer], Discharge Planning  Education Outcome: Acknowledges understanding  Clinical Observations/Feedback: Patient arrived to group session at approximately 3:20pm, as wrap up discussion was taking place. Patient sat in group session during wrap up discussion with eyes closed and head in hands.  Marykay Lex Sophiya Morello, LRT/CTRS  Demarkis Gheen L 12/07/2012 4:55 PM

## 2012-12-07 NOTE — H&P (Signed)
Psychiatric Admission Assessment Adult  Patient Identification:  James [name]  Date of Evaluation:  12/07/2012  Chief Complaint:  mood disorder  History of Present Illness: This is a 52 year old African-American male. Patient is a walk-in admit to [facility], medically cleared at the [facility]. Patient reports, "I came in to this hospital yesterday because, I needed some help for my substance abuse issues. I want to get to the bottom of why I keep relapsing on alcohol and crack cocaine. I have been using drugs and drinking too much alcohol for the last 25 or more years, off and on. However, my substance abuse/use has worsened in the last 3 days. I use crack and drink too much alcohol because I have an addiction. I was once sober x 5 years, and that was 4 years ago. Then, my father died, I relapsed. I would like to be sent to a Residential treatment Center after my detoxification treatment in the hospital. My substance use has caused me a lot of personal and familial problems. I'm about to lose my wife, if I have not already lost her by now. I have been depressed all week. I was contemplating on drinking something yesterday to just get it over with. I'm tired of living this way".  Elements:  Location:  [facility] adult unit. Quality:  Alcohol/cocaine cravings, binge drinking, depression, high anxiety. Severity:  Severe. Timing:  "Mycocaine/alcohol consumption worsened over the last 3 days". Duration:  "My alcoholism and cocaine addiction started when I was 16". Context:  "I have been an for 36 years. I can't seem to manitain sobriety, it does not matter how hard I try".  Associated Signs/Synptoms:  Depression Symptoms:  depressed mood, feelings of worthlessness/guilt, hopelessness, anxiety, loss of energy/fatigue, disturbed sleep,  (Hypo) Manic Symptoms:  Impulsivity, Irritable Mood,  Anxiety Symptoms:  Excessive Worry,  Psychotic Symptoms:  Hallucinations: Denies  PTSD  Symptoms: Had a traumatic exposure:  Denies any hx of taumatic event.  Psychiatric Specialty Exam: Physical Exam  Constitutional: He is oriented to person, place, and time. He appears well-developed.  Tall and skinny  HENT:  Head: Normocephalic.  Eyes: Pupils are equal, round, and reactive to light.  Neck: Normal range of motion.  Cardiovascular: Normal rate.   Respiratory: Effort normal.  GI: Soft.  Musculoskeletal: Normal range of motion.  Neurological: He is alert and oriented to person, place, and time.  Skin: Skin is warm and dry.  Psychiatric: His speech is normal. Thought content normal. His mood appears anxious (rated at #7). He is withdrawn. Cognition and memory are normal. He expresses impulsivity. He exhibits a depressed mood (rated at #8).    Review of Systems  Constitutional: Negative.   HENT: Negative.   Eyes: Negative.   Respiratory: Negative.   Cardiovascular: Negative.   Gastrointestinal: Negative.   Genitourinary: Negative.   Musculoskeletal: Negative.   Skin: Negative.   Neurological: Negative.   Endo/Heme/Allergies: Negative.   Psychiatric/Behavioral: Positive for depression and substance abuse (Alcoholism, cocaine dependence). Negative for suicidal ideas, hallucinations and memory loss. The patient is nervous/anxious ( ) and has insomnia.     Blood pressure 112/74, pulse 106, temperature 98 F (36.7 C), temperature source Oral, resp. rate 18, height 6' (1.829 m), weight 67.132 kg (148 lb), SpO2 100.00%.Body mass index is 20.07 kg/(m^2).  General Appearance: Fairly Groomed  Patent attorney::  Fair  Speech:  Clear and Coherent  Volume:  Normal  Mood:  Anxious, Depressed and Hopeless  Affect:  Flat  Thought Process:  Coherent, Goal Directed and Intact  Orientation:  Full (Time, Place, and Person)  Thought Content:  Rumination and Denies hallucinations  Suicidal Thoughts:  No  Homicidal Thoughts:  No  Memory:  Immediate;   Good Recent;   Good Remote;    Good  Judgement:  Fair  Insight:  Fair  Psychomotor Activity:  Normal  Concentration:  Fair  Recall:  Good  Akathisia:  No  Handed:  Right  AIMS (if indicated):     Assets:  Communication Skills Desire for Improvement Physical Health  Sleep:  Number of Hours: 2.75 (new admit)    Past Psychiatric History: Diagnosis: Alcohol dependence, cocaine dependence  Hospitalizations: [facility]  Outpatient Care: None reported  Substance Abuse Care: None reported  Self-Mutilation: Denies  Suicidal Attempts: Denies attempts and or thoughts  Violent Behaviors: None reported   Past Medical History:   Past Medical History  Diagnosis Date  . Diabetes mellitus without complication    None.  Allergies:  No Known Allergies  PTA Medications: Prescriptions prior to admission  Medication Sig Dispense Refill  . insulin aspart protamine- aspart (NOVOLOG MIX 70/30) (70-30) 100 UNIT/ML injection Inject 10 Units into the skin daily with supper.      . metFORMIN (GLUCOPHAGE) 1000 MG tablet Take 1 tablet (1,000 mg total) by mouth 2 (two) times daily with a meal.  60 tablet  0   Previous Psychotropic Medications:  Medication/Dose  See medication lists               Substance Abuse History in the last 12 months:  yes  Consequences of Substance Abuse: Medical Consequences:  Liver damage, Possible death by overdose Legal Consequences:  Arrests, jail time, Loss of driving privilege. Family Consequences:  Family discord, divorce and or separation.  Social History:  reports that he has been smoking Cigarettes.  He has a 11.5 pack-year smoking history. He has never used smokeless tobacco. He reports that he drinks about 37.2 ounces of alcohol per week. He reports that he uses illicit drugs (Cocaine and "Crack" cocaine) about 3 times per week. Additional Social History: Pain Medications: See hiome med list Prescriptions: See home med lsit Over the Counter: See home med list History of alcohol / drug  use?: Yes Longest period of sobriety (when/how long): 5 yrs Negative Consequences of Use: Financial;Personal relationships;Work / Programmer, multimedia Name of Substance 1: cocaine 1 - Age of First Use: 16 1 - Amount (size/oz): $50 or more 1 - Frequency: daily or every other day 1 - Last Use / Amount: 8/26 Name of Substance 2: alcohol(beer or liquor) 2 - Age of First Use: 16 2 - Amount (size/oz): 12 40s or 50 shots of liquor 2 - Frequency: weekly 2 - Last Use / Amount: 8/26  Current Place of Residence: [place], [place]  Place of Birth: [place], [place]  Family Members: "My wife and kids"  Marital Status:  Married  Children: 3  Sons: 1  Daughters: 2  Relationships: Married  Education:  [employer] Financial planner Problems/Performance: Completed high school  Religious Beliefs/Practices: NA  History of Abuse (Emotional/Phsycial/Sexual): Denies  Occupational Experiences: Employed, part Educational psychologist History:  None.  Legal History: Denies any pending legal charges  Hobbies/Interests:None reported  Family History:  History reviewed. No pertinent family history.  Results for orders placed during the hospital encounter of 12/07/12 (from the past 72 hour(s))  GLUCOSE, CAPILLARY     Status: Abnormal   Collection Time    12/07/12  2:13 AM      Result Value Range   Glucose-Capillary 249 (*) 70 - 99 mg/dL  GLUCOSE, CAPILLARY     Status: Abnormal   Collection Time    12/07/12  6:43 AM      Result Value Range   Glucose-Capillary 260 (*) 70 - 99 mg/dL   Psychological Evaluations:  Assessment:   DSM5: Schizophrenia Disorders:  NA Obsessive-Compulsive Disorders:  NA Trauma-Stressor Disorders:  NA Substance/Addictive Disorders:  Alcohol Withdrawal (291.81), Cocaine dependence Depressive Disorders:  Major Depressive Disorder - Moderate (296.22)  AXIS I:   Alcohol Withdrawal (291.81), Cocaine dependence, Major depressive disorder AXIS II:  Deferred AXIS III:   Past Medical History   Diagnosis Date  . Diabetes mellitus without complication    AXIS IV:  other psychosocial or environmental problems and Polysubstance dependence AXIS V:  1-10 persistent dangerousness to self and others present  Treatment Plan/Recommendations: 1. Admit for crisis management and stabilization, estimated length of stay 3-5 days.  2. Medication management to reduce current symptoms to base line and improve the patient's overall level of functioning; (a). Continue librium detoxification treatment program. 3. Treat health problems as indicated.  4. Develop treatment plan to decrease risk of relapse upon discharge and the need for readmission.  5. Psycho-social education regarding relapse prevention and self care.  6. Health care follow up as needed for medical problems.  7. Review, reconcile, and reinstate any pertinent home medications for other health issues where appropriate. 8. Call for consults with hospitalist for any additional specialty patient care services as needed.  Treatment Plan Summary: Daily contact with patient to assess and evaluate symptoms and progress in treatment Medication management Supportive approach/coping skills/relapse prevention Address detox needs, reassess and address the co morbidities Current Medications:  Current Facility-Administered Medications  Medication Dose Route Frequency Provider Last Rate Last Dose  . acetaminophen (TYLENOL) tablet 650 mg  650 mg Oral Q6H PRN Kerry Hough, PA-C      . alum & mag hydroxide-simeth (MAALOX/MYLANTA) 200-200-20 MG/5ML suspension 30 mL  30 mL Oral Q4H PRN Kerry Hough, PA-C      . chlordiazePOXIDE (LIBRIUM) capsule 25 mg  25 mg Oral QID PRN Kerry Hough, PA-C      . insulin aspart (novoLOG) injection 0-15 Units  0-15 Units Subcutaneous TID WC Kerry Hough, PA-C   8 Units at 12/07/12 0700  . insulin aspart (novoLOG) injection 0-5 Units  0-5 Units Subcutaneous QHS Kerry Hough, PA-C   2 Units at 12/07/12 0232   . insulin glargine (LANTUS) injection 10 Units  10 Units Subcutaneous QHS Kerry Hough, PA-C   10 Units at 12/07/12 1610  . magnesium hydroxide (MILK OF MAGNESIA) suspension 30 mL  30 mL Oral Daily PRN Kerry Hough, PA-C      . metFORMIN (GLUCOPHAGE) tablet 1,000 mg  1,000 mg Oral BID WC Kerry Hough, PA-C   1,000 mg at 12/07/12 0827  . traZODone (DESYREL) tablet 50 mg  50 mg Oral QHS,MR X 1 Kerry Hough, PA-C        Observation Level/Precautions:  15 minute checks  Laboratory:  Reviewed ED lab findings on file, (+) Cocaine  Psychotherapy:  Group sessions  Medications:  See medication lists  Consultations: As needed   Discharge Concerns:  Maintaining sobriety  Estimated LOS: 3-5 days  Other:     I certify that inpatient services furnished can reasonably be expected to improve the patient's condition.   [place],  Nelda Marseille, PMHNP-BC 8/28/201411:25 AM  Agree with assessment and plan Madie Reno A. [place].D.

## 2012-12-07 NOTE — [facility] Suicide Risk Assessment (Signed)
Suicide Risk Assessment  Admission Assessment     Nursing information obtained from:  Patient Demographic factors:  Male;Low socioeconomic status;Unemployed Current Mental Status:  Suicidal ideation indicated by others;Self-harm thoughts Loss Factors:  Loss of significant relationship;Financial problems / change in socioeconomic status Historical Factors:  Prior suicide attempts;Family history of mental illness or substance abuse Risk Reduction Factors:  Sense of responsibility to family  CLINICAL FACTORS:   Depression:   Comorbid alcohol abuse/dependence Alcohol/Substance Abuse/Dependencies  COGNITIVE FEATURES THAT CONTRIBUTE TO RISK:  Closed-mindedness Polarized thinking Thought constriction (tunnel vision)    SUICIDE RISK:   Moderate:  Frequent suicidal ideation with limited intensity, and duration, some specificity in terms of plans, no associated intent, good self-control, limited dysphoria/symptomatology, some risk factors present, and identifiable protective factors, including available and accessible social support.  PLAN OF CARE: Supportive approach/coping skills/relapse prevention                               Reassess and address the co morbidities                               Pain management  I certify that inpatient services furnished can reasonably be expected to improve the patient's condition.  Naylin Burkle A 12/07/2012, 5:56 PM

## 2012-12-07 NOTE — Progress Notes (Signed)
Vol admit, 52 yo AA male request help with detox from alcohol and help with his alcohol/cocaine addiction.  Pt was having suicidal thoughts to drink Drano.  He reports that his substance abuse is ruining his life and relationship to his wife and children.  Pt went on a binge and wife of 32 yrs will not let him return to the home.  Pt was here in November 2013 and was to f/u with [facility], but did not feel they were helping him, so he stopped going.  Medical hx IDDM.  Pt's blood glucose on arrival to the ED was 536.  Pt was cooperative with the admission process.  Pt was re-oriented to unit/room.  Safety checks q15 mins initiated.

## 2012-12-07 NOTE — [facility] Counselor (Signed)
Adult Psychosocial Assessment Update Interdisciplinary Team  [facility] admissions/discharges:  Admissions Discharges  Date: 03/01/12 Date: 03/06/12  Date: Date:  Date: Date:  Date: Date:  Date: Date:   Changes since the last Psychosocial Assessment (including adherence to outpatient mental health and/or substance abuse treatment, situational issues contributing to decompensation and/or relapse). Pt states that since his last admission pt did not follow up with outpatient treatment and continued to use.  Pt states that he should've went for further treatment then but declined.  Pt states that he has burned bridges with all family and is now separated from his wife and staying with a friend temporarily.  Pt states that he does want rehab this time, after detox.               Discharge Plan 1. Will you be returning to the same living situation after discharge?   Yes: No:   X   If no, what is your plan?  Pt is unsure at this time if he can return to friend's home.             2. Would you like a referral for services when you are discharged? Yes:   X  If yes, for what services?  CSW will make referrals for further inpatient treatment.  No:              Summary and Recommendations (to be completed by the evaluator) Patient is a 52 year old African American Male with a diagnosis of Mood Disorder NOS 296.90; Alcohol Dependence 303.90; Cocaine Dependence 304.20.  Patient lives in [place] alone.  Patient will benefit from crisis stabilization, medication evaluation, group therapy and psycho education in addition to case management for discharge planning.                         Signature:  Carmina Miller, 12/07/2012 9:49 AM

## 2012-12-07 NOTE — Progress Notes (Signed)
The focus of this group is to educate the patient on the purpose and policies of crisis stabilization and provide a format to answer questions about their admission.  The group details unit policies and expectations of patients while admitted.  Patient entered the group late.  He participated in the discussion and was respectful of his peers.

## 2012-12-08 DIAGNOSIS — F321 Major depressive disorder, single episode, moderate: Secondary | ICD-10-CM

## 2012-12-08 DIAGNOSIS — F1020 Alcohol dependence, uncomplicated: Secondary | ICD-10-CM

## 2012-12-08 LAB — GLUCOSE, CAPILLARY: Glucose-Capillary: 284 mg/dL — ABNORMAL HIGH (ref 70–99)

## 2012-12-08 MED ORDER — MAGNESIUM CITRATE PO SOLN: 1.0000 | Freq: Once | ORAL | Status: DC

## 2012-12-08 NOTE — Progress Notes (Signed)
[facility] MD Progress Note  12/08/2012 5:05 PM James [name]  MRN:  [id number] Subjective:  James [name] is having a hard time. Her wife does not want him back. States it has never been this bad between them He does understand that she will not want anything to do with him due this last relapse but he wants to work on getting healthy and have another chance with her. He does not want to leave his house. He is willing to go to rehab. Admits to feeling very depressed with some SI Diagnosis:   DSM5: Schizophrenia Disorders:   Obsessive-Compulsive Disorders:   Trauma-Stressor Disorders:   Substance/Addictive Disorders:  Alcohol Related Disorder - Severe (303.90) Depressive Disorders:  Major Depressive Disorder - Moderate (296.22) Cocaine dependence     ADL's:  Intact  Sleep: Fair  Appetite:  Poor  Suicidal Ideation:  Plan:  denies Intent:  denies Means:  denies Homicidal Ideation:  Plan:  denies Intent:  denies Means:  denies AEB (as evidenced by):  Psychiatric Specialty Exam: Review of Systems  Constitutional: Negative.   HENT: Negative.   Eyes: Negative.   Respiratory: Negative.   Cardiovascular: Negative.   Gastrointestinal: Negative.   Genitourinary: Negative.   Musculoskeletal: Negative.   Skin: Negative.   Neurological: Negative.   Endo/Heme/Allergies: Negative.   Psychiatric/Behavioral: Positive for depression and substance abuse. The patient is nervous/anxious and has insomnia.     Blood pressure 119/76, pulse 91, temperature 97.3 F (36.3 C), temperature source Oral, resp. rate 17, height 6' (1.829 m), weight 67.132 kg (148 lb), SpO2 100.00%.Body mass index is 20.07 kg/(m^2).  General Appearance: Fairly Groomed  Patent attorney::  Minimal  Speech:  Clear and Coherent, Slow and broken  Volume:  Decreased  Mood:  Anxious, Depressed and worried  Affect:  Tearful and sad, worried  Thought Process:  Coherent and Goal Directed  Orientation:  Full (Time, Place, and Person)   Thought Content:  worreis, concerns, shame and guilt  Suicidal Thoughts:  Intermittent  Homicidal Thoughts:  No  Memory:  Immediate;   Fair Recent;   Fair Remote;   Fair  Judgement:  Fair  Insight:  Present  Psychomotor Activity:  Restlessness  Concentration:  Fair  Recall:  Fair  Akathisia:  No  Handed:  Right  AIMS (if indicated):     Assets:  Desire for Improvement  Sleep:  Number of Hours: 5.75   Current Medications: Current Facility-Administered Medications  Medication Dose Route Frequency Provider Last Rate Last Dose  . acetaminophen (TYLENOL) tablet 650 mg  650 mg Oral Q6H PRN Kerry Hough, PA-C      . alum & mag hydroxide-simeth (MAALOX/MYLANTA) 200-200-20 MG/5ML suspension 30 mL  30 mL Oral Q4H PRN Kerry Hough, PA-C      . buPROPion (WELLBUTRIN XL) 24 hr tablet 150 mg  150 mg Oral Daily Rachael Fee, MD   150 mg at 12/08/12 0839  . chlordiazePOXIDE (LIBRIUM) capsule 25 mg  25 mg Oral QID PRN Kerry Hough, PA-C      . insulin aspart (novoLOG) injection 0-15 Units  0-15 Units Subcutaneous TID WC Kerry Hough, PA-C   5 Units at 12/08/12 1205  . insulin aspart (novoLOG) injection 0-5 Units  0-5 Units Subcutaneous QHS Kerry Hough, PA-C   2 Units at 12/07/12 0232  . insulin aspart protamine- aspart (NOVOLOG MIX 70/30) injection 10 Units  10 Units Subcutaneous Q supper Sanjuana Kava, NP   10 Units at 12/07/12 1725  .  insulin glargine (LANTUS) injection 10 Units  10 Units Subcutaneous QHS Kerry Hough, PA-C   10 Units at 12/07/12 2202  . magnesium hydroxide (MILK OF MAGNESIA) suspension 30 mL  30 mL Oral Daily PRN Kerry Hough, PA-C      . metFORMIN (GLUCOPHAGE) tablet 1,000 mg  1,000 mg Oral BID WC Kerry Hough, PA-C   1,000 mg at 12/08/12 1658  . traZODone (DESYREL) tablet 50 mg  50 mg Oral QHS,MR X 1 Kerry Hough, PA-C   50 mg at 12/07/12 2203    Lab Results:  Results for orders placed during the hospital encounter of 12/07/12 (from the past 48  hour(s))  GLUCOSE, CAPILLARY     Status: Abnormal   Collection Time    12/07/12  2:13 AM      Result Value Range   Glucose-Capillary 249 (*) 70 - 99 mg/dL  GLUCOSE, CAPILLARY     Status: Abnormal   Collection Time    12/07/12  6:43 AM      Result Value Range   Glucose-Capillary 260 (*) 70 - 99 mg/dL  HEMOGLOBIN Z6X     Status: Abnormal   Collection Time    12/07/12  6:50 AM      Result Value Range   Hemoglobin A1C 11.1 (*) <5.7 %   Comment: (NOTE)                                                                               According to the ADA Clinical Practice Recommendations for 2011, when     HbA1c is used as a screening test:      >=6.5%   Diagnostic of Diabetes Mellitus               (if abnormal result is confirmed)     5.7-6.4%   Increased risk of developing Diabetes Mellitus     References:Diagnosis and Classification of Diabetes Mellitus,Diabetes     Care,2011,34(Suppl 1):S62-S69 and Standards of Medical Care in             Diabetes - 2011,Diabetes Care,2011,34 (Suppl 1):S11-S61.   Mean Plasma Glucose 272 (*) <117 mg/dL   Comment: Performed at [facility]  GLUCOSE, CAPILLARY     Status: Abnormal   Collection Time    12/07/12 11:42 AM      Result Value Range   Glucose-Capillary 170 (*) 70 - 99 mg/dL   Comment 1 Notify RN    GLUCOSE, CAPILLARY     Status: Abnormal   Collection Time    12/07/12  5:09 PM      Result Value Range   Glucose-Capillary 267 (*) 70 - 99 mg/dL   Comment 1 Notify RN    GLUCOSE, CAPILLARY     Status: Abnormal   Collection Time    12/07/12  9:33 PM      Result Value Range   Glucose-Capillary 131 (*) 70 - 99 mg/dL   Comment 1 Notify RN     Comment 2 Documented in Chart    GLUCOSE, CAPILLARY     Status: Abnormal   Collection Time    12/08/12  6:17 AM      Result  Value Range   Glucose-Capillary 196 (*) 70 - 99 mg/dL  GLUCOSE, CAPILLARY     Status: Abnormal   Collection Time    12/08/12 11:46 AM      Result Value Range    Glucose-Capillary 205 (*) 70 - 99 mg/dL   Comment 1 Notify RN      Physical Findings: AIMS: Facial and Oral Movements Muscles of Facial Expression: None, normal Lips and Perioral Area: None, normal Jaw: None, normal Tongue: None, normal,Extremity Movements Upper (arms, wrists, hands, fingers): None, normal Lower (legs, knees, ankles, toes): None, normal, Trunk Movements Neck, shoulders, hips: None, normal, Overall Severity Severity of abnormal movements (highest score from questions above): None, normal Incapacitation due to abnormal movements: None, normal Patient's awareness of abnormal movements (rate only patient's report): No Awareness, Dental Status Current problems with teeth and/or dentures?: Yes Does patient usually wear dentures?: No  CIWA:  CIWA-Ar Total: 1 COWS:     Treatment Plan Summary: Daily contact with patient to assess and evaluate symptoms and progress in treatment Medication management  Plan: Supportive approach/coping skills/relapse prevention           Optimize treatment with psychotropics  Medical Decision Making Problem Points:  Review of last therapy session (1) and Review of psycho-social stressors (1) Data Points:  Review of medication regiment & side effects (2)  I certify that inpatient services furnished can reasonably be expected to improve the patient's condition.   Otilio Groleau A 12/08/2012, 5:05 PM

## 2012-12-08 NOTE — Tx Team (Signed)
Interdisciplinary Treatment Plan Update (Adult)  Date: 12/08/2012  Time Reviewed:  9:45 AM  Progress in Treatment: Attending groups: Yes Participating in groups:  Yes Taking medication as prescribed:  Yes Tolerating medication:  Yes Family/Significant othe contact made: CSW will attempt Patient understands diagnosis:  Yes Discussing patient identified problems/goals with staff:  Yes Medical problems stabilized or resolved:  Yes Denies suicidal/homicidal ideation: Yes Issues/concerns per patient self-inventory:  Yes Other:  New problem(s) identified: N/A  Discharge Plan or Barriers: Pt wants further inpatient treatment upon d/c.  Pt can go to [facility] or [facility] when stable.    Reason for Continuation of Hospitalization: Anxiety Depression Detox Medication Stabilization  Comments: N/A  Estimated length of stay: 3-4 days  For review of initial/current patient goals, please see plan of care.  Attendees: Patient:     Family:     Physician:  Dr. Dub Mikes 12/08/2012 10:42 AM   Nursing:   Burnetta Sabin, RN 12/08/2012 10:42 AM   Clinical Social Worker:  Reyes Ivan, LCSWA 12/08/2012 10:42 AM   Other: Chinita Greenland, RN 12/08/2012 10:42 AM   Other:  Trula Slade, LCSWA 12/08/2012 10:42 AM   Other:  Serena Colonel, NP 12/08/2012 10:42 AM   Other:     Other:    Other:    Other:    Other:    Other:    Other:     Scribe for Treatment Team:   Carmina Miller, 12/08/2012 , 10:42 AM

## 2012-12-08 NOTE — [facility] Suicide Risk Assessment (Signed)
[facility] INPATIENT:  Family/Significant Other Suicide Prevention Education  Suicide Prevention Education:  Education Completed; Dwyane Dee - friend [phone]),  (name of family member/significant other) has been identified by the patient as the family member/significant other with whom the patient will be residing, and identified as the person(s) who will aid the patient in the event of a mental health crisis (suicidal ideations/suicide attempt).  With written consent from the patient, the family member/significant other has been provided the following suicide prevention education, prior to the and/or following the discharge of the patient.  The suicide prevention education provided includes the following:  Suicide risk factors  Suicide prevention and interventions  National Suicide Hotline telephone number  [facility] assessment telephone number  [facility] Emergency Assistance 911  [facility] and/or Residential Mobile Crisis Unit telephone number  Request made of family/significant other to:  Remove weapons (e.g., guns, rifles, knives), all items previously/currently identified as safety concern.    Remove drugs/medications (over-the-counter, prescriptions, illicit drugs), all items previously/currently identified as a safety concern.  The family member/significant other verbalizes understanding of the suicide prevention education information provided.  The family member/significant other agrees to remove the items of safety concern listed above.  Carmina Miller 12/08/2012, 11:53 AM

## 2012-12-08 NOTE — Progress Notes (Signed)
Adult Psychoeducational Group Note  Date:  12/08/2012 Time:  1:08 PM  Group Topic/Focus:  Relapse Prevention Planning:   The focus of this group is to define relapse and discuss the need for planning to combat relapse.  Participation Level:  Did Not Attend   James [name] 12/08/2012, 1:08 PM

## 2012-12-08 NOTE — Progress Notes (Signed)
D   Pt is in bed resting with eyes closed no distress noted A   Will continue Q 15 min checks and provide medication and verbal support when needed R   Pt safe at present

## 2012-12-08 NOTE — Progress Notes (Signed)
Patient ID: James [name], male   DOB: 1960-05-20, 52 y.o.   MRN: [id number] D: Patient pleasant and cooperative with assessment. Pt interacting well with peers.  A: Q 15 minute safety checks, encourage group attendance and peer interaction. Administer medications as ordered. R: Pt denies SI/HI at this time. Pt attended group session and was compliant with medications.

## 2012-12-08 NOTE — [facility] Group Notes (Signed)
[facility] LCSW Aftercare Discharge Planning Group Note   12/08/2012 8:45 AM  Participation Quality:  Did Not Attend  James [name], LCSWA 12/08/2012 9:25 AM   

## 2012-12-08 NOTE — Progress Notes (Signed)
D:  James [name] reports that he feels ok today.  He denies SI/HI/AVH at this time.  When asked about withdrawal symptoms he states "I'm ok".  He did not attend morning groups, but was up and around this afternoon. A:  Safety checks q 15 minutes.  Emotional support provided.  Medications administered as ordered. R:  Safety maintained on unit.

## 2012-12-08 NOTE — Progress Notes (Signed)
NUTRITION ASSESSMENT  Pt identified as at risk on the Malnutrition Screen Tool  INTERVENTION: 1. Educated patient on the importance of nutrition and encouraged intake of food and beverages. 2. Discussed weight goals.  NUTRITION DIAGNOSIS: Unintentional weight loss related to sub-optimal intake as evidenced by pt report.   Goal: Pt to meet >/= 90% of their estimated nutrition needs.  Monitor:  PO intake  Assessment:  Patient admitted with substance abuse (crack and etoh) and depression.  Hx of DM on Glucophage and insulin.  HgbA1C=11.1.  Patient also on zocor.  Patient states that he was not taking his meds or eating well due to increased drug and etoh abuse.  UBW 190 lbs 1 1/2 years ago.  Weight loss of 22% in the past 1 1/2 years secondary to drug abuse and uncontrolled diabetes.  Patient reports eating very well currently.    52 y.o. male  Height: Ht Readings from Last 1 Encounters:  12/07/12 6' (1.829 m)    Weight: Wt Readings from Last 1 Encounters:  12/07/12 148 lb (67.132 kg)    Weight Hx: Wt Readings from Last 10 Encounters:  12/07/12 148 lb (67.132 kg)  12/06/12 140 lb (63.504 kg)  03/01/12 160 lb (72.576 kg)  02/26/12 174 lb (78.926 kg)  02/12/09 178 lb 8 oz (80.967 kg)  08/06/08 187 lb (84.823 kg)  12/20/07 193 lb (87.544 kg)  05/01/07 186 lb (84.369 kg)  12/21/06 188 lb (85.276 kg)    BMI:  Body mass index is 20.07 kg/(m^2). Pt meets criteria for wnl based on current BMI.  Estimated Nutritional Needs: Kcal: 25-30 kcal/kg Protein: > 1 gram protein/kg Fluid: 1 ml/kcal  Diet Order: Carb Control Pt is also offered choice of unit snacks mid-morning and mid-afternoon.  Pt is eating as desired.   Lab results and medications reviewed.   Oran Rein, RD, LDN Clinical Inpatient Dietitian Pager:  [phone] Weekend and after hours pager:  [phone]

## 2012-12-08 NOTE — [facility] Group Notes (Signed)
[facility] LCSW Group Therapy  12/08/2012  1:15 PM   Type of Therapy:  Group Therapy  Participation Level:  Active  Participation Quality:  Appropriate and Attentive  Affect:  Appropriate, Drowsy  Cognitive:  Alert and Appropriate  Insight:  Developing/Improving and Engaged  Engagement in Therapy:  Developing/Improving and Engaged  Modes of Intervention:  Clarification, Confrontation, Discussion, Education, Exploration, Limit-setting, Orientation, Problem-solving, Rapport Building, Dance movement psychotherapist, Socialization and Support  Summary of Progress/Problems: The topic for today was feelings about relapse.  Pt discussed what relapse prevention is to them and identified triggers that they are on the path to relapse.  Pt processed their feeling towards relapse and was able to relate to peers.  Pt discussed coping skills that can be used for relapse prevention.  Pt was able to process in the group setting the loss of relationships, such as with his children and 39 year marriage with his wife.  Pt discussed knowing that from his drug use and actions he might have burned these bridges and not be able to restore these relationships.  Pt discussed focusing on getting himself better, using his family as motivation.  Pt actively participated while sharing but then was drowsy for the remainder of group.     James [name], LCSWA 12/08/2012 2:04 PM

## 2012-12-08 NOTE — Progress Notes (Signed)
Adult Psychoeducational Group Note  Date:  12/08/2012 Time:  10:11 PM  Group Topic/Focus:  AA group  Participation Level:  Active  Participation Quality:  Appropriate  Affect:  Appropriate  Cognitive:  Alert  Insight: Appropriate  Engagement in Group:  Engaged  Modes of Intervention:  Discussion  Additional Comments:    Flonnie Hailstone 12/08/2012, 10:11 PM

## 2012-12-09 DIAGNOSIS — F1994 Other psychoactive substance use, unspecified with psychoactive substance-induced mood disorder: Secondary | ICD-10-CM

## 2012-12-09 DIAGNOSIS — F1910 Other psychoactive substance abuse, uncomplicated: Secondary | ICD-10-CM

## 2012-12-09 DIAGNOSIS — F1010 Alcohol abuse, uncomplicated: Secondary | ICD-10-CM

## 2012-12-09 LAB — GLUCOSE, CAPILLARY: Glucose-Capillary: 194 mg/dL — ABNORMAL HIGH (ref 70–99)

## 2012-12-09 NOTE — Progress Notes (Signed)
D.  Pt pleasant on approach, still endorses depression, denies complaints at this time.  Denies SI/HI/hallucinations at this time.  Positive for evening wrap up group.  Interacting appropriately within milieu this evening.  A.  Support and encouragement offered  R.  Pt remains safe, will continue to monitor.

## 2012-12-09 NOTE — Progress Notes (Signed)
Psychoeducational Group Note  Date:  12/09/2012 Time:  0945 am  Group Topic/Focus:  Identifying Needs:   The focus of this group is to help patients identify their personal needs that have been historically problematic and identify healthy behaviors to address their needs.  Participation Level:  Active  Participation Quality:  Appropriate  Affect:  Anxious and Appropriate  Cognitive:  Alert and Appropriate  Insight:  Developing/Improving  Engagement in Group:  Developing/Improving  Additional Comments:    Andrena Mews 12/09/2012,3:56 PM

## 2012-12-09 NOTE — Progress Notes (Signed)
[facility] MD Progress Note  12/09/2012 5:47 PM James [name]  MRN:  [id number]  Subjective:  James [name] reports he continues to have a significant amount of depression anxiety, and rates both of them as an 8 on a scale of 1-10 where 10 is the worst. He reports that he is sleeping and eating well. He denies any suicidal or homicidal ideation. He denies any auditory or visual hallucinations. He denies any cravings for alcohol or cocaine, but endorses thoughts of using. He wonders if he is bipolar because his mood can change so quickly. He continues to express a significant amount worry about his wife wanting to separate.  Diagnosis:   DSM5: Schizophrenia Disorders:   Obsessive-Compulsive Disorders:   Trauma-Stressor Disorders:   Substance/Addictive Disorders:  Alcohol Related Disorder - Severe (303.90) Depressive Disorders:  Major Depressive Disorder - Severe (296.23)  Axis I: Alcohol Abuse, Major Depression, Recurrent severe, Substance Abuse and Substance Induced Mood Disorder Axis II: Deferred Axis III:  Past Medical History  Diagnosis Date  . Diabetes mellitus without complication     ADL's:  Intact  Sleep: Good  Appetite:  Good  Suicidal Ideation:  Patient denies any thought, plan, or intent Homicidal Ideation:  Patient denies any thought, plan, or intent AEB (as evidenced by):  Psychiatric Specialty Exam: Review of Systems  Constitutional: Negative.   HENT: Negative.   Eyes: Negative.   Respiratory: Negative.   Cardiovascular: Negative.   Gastrointestinal: Negative.   Genitourinary: Negative.   Musculoskeletal: Negative.   Skin: Negative.   Neurological: Negative.   Endo/Heme/Allergies: Negative.   Psychiatric/Behavioral: Positive for depression. Negative for suicidal ideas and hallucinations. The patient is nervous/anxious. The patient does not have insomnia.     Blood pressure 134/74, pulse 94, temperature 97.3 F (36.3 C), temperature source Oral, resp. rate 19,  height 6' (1.829 m), weight 67.132 kg (148 lb), SpO2 100.00%.Body mass index is 20.07 kg/(m^2).  General Appearance: Casual  Eye Contact::  Fair  Speech:  Clear and Coherent  Volume:  Decreased  Mood:  Depressed  Affect:  Congruent  Thought Process:  Linear  Orientation:  Full (Time, Place, and Person)  Thought Content:  WDL  Suicidal Thoughts:  No  Homicidal Thoughts:  No  Memory:  Immediate;   Fair Recent;   Fair Remote;   Fair  Judgement:  Fair  Insight:  Lacking  Psychomotor Activity:  Psychomotor Retardation  Concentration:  Good  Recall:  Good  Akathisia:  No  Handed:  Right  AIMS (if indicated):     Assets:  Communication Skills Desire for Improvement  Sleep:  Number of Hours: 6.25   Current Medications: Current Facility-Administered Medications  Medication Dose Route Frequency Provider Last Rate Last Dose  . acetaminophen (TYLENOL) tablet 650 mg  650 mg Oral Q6H PRN Kerry Hough, PA-C      . alum & mag hydroxide-simeth (MAALOX/MYLANTA) 200-200-20 MG/5ML suspension 30 mL  30 mL Oral Q4H PRN Kerry Hough, PA-C      . buPROPion (WELLBUTRIN XL) 24 hr tablet 150 mg  150 mg Oral Daily Rachael Fee, MD   150 mg at 12/09/12 1016  . chlordiazePOXIDE (LIBRIUM) capsule 25 mg  25 mg Oral QID PRN Kerry Hough, PA-C      . insulin aspart (novoLOG) injection 0-15 Units  0-15 Units Subcutaneous TID WC Kerry Hough, PA-C   15 Units at 12/09/12 1720  . insulin aspart (novoLOG) injection 0-5 Units  0-5 Units Subcutaneous QHS Karleen Hampshire  E Simon, PA-C   2 Units at 12/07/12 0232  . insulin aspart protamine- aspart (NOVOLOG MIX 70/30) injection 10 Units  10 Units Subcutaneous Q supper Sanjuana Kava, NP   10 Units at 12/09/12 1720  . insulin glargine (LANTUS) injection 10 Units  10 Units Subcutaneous QHS Kerry Hough, PA-C   10 Units at 12/07/12 2202  . magnesium hydroxide (MILK OF MAGNESIA) suspension 30 mL  30 mL Oral Daily PRN Kerry Hough, PA-C      . metFORMIN (GLUCOPHAGE)  tablet 1,000 mg  1,000 mg Oral BID WC Kerry Hough, PA-C   1,000 mg at 12/09/12 1721  . traZODone (DESYREL) tablet 50 mg  50 mg Oral QHS,MR X 1 Kerry Hough, PA-C   50 mg at 12/08/12 2301    Lab Results:  Results for orders placed during the hospital encounter of 12/07/12 (from the past 48 hour(s))  GLUCOSE, CAPILLARY     Status: Abnormal   Collection Time    12/07/12  9:33 PM      Result Value Range   Glucose-Capillary 131 (*) 70 - 99 mg/dL   Comment 1 Notify RN     Comment 2 Documented in Chart    GLUCOSE, CAPILLARY     Status: Abnormal   Collection Time    12/08/12  6:17 AM      Result Value Range   Glucose-Capillary 196 (*) 70 - 99 mg/dL  GLUCOSE, CAPILLARY     Status: Abnormal   Collection Time    12/08/12 11:46 AM      Result Value Range   Glucose-Capillary 205 (*) 70 - 99 mg/dL   Comment 1 Notify RN    GLUCOSE, CAPILLARY     Status: Abnormal   Collection Time    12/08/12  5:01 PM      Result Value Range   Glucose-Capillary 284 (*) 70 - 99 mg/dL  GLUCOSE, CAPILLARY     Status: None   Collection Time    12/08/12  9:45 PM      Result Value Range   Glucose-Capillary 74  70 - 99 mg/dL  GLUCOSE, CAPILLARY     Status: Abnormal   Collection Time    12/09/12  6:34 AM      Result Value Range   Glucose-Capillary 141 (*) 70 - 99 mg/dL  GLUCOSE, CAPILLARY     Status: Abnormal   Collection Time    12/09/12 11:50 AM      Result Value Range   Glucose-Capillary 194 (*) 70 - 99 mg/dL   Comment 1 Notify RN    GLUCOSE, CAPILLARY     Status: Abnormal   Collection Time    12/09/12  4:47 PM      Result Value Range   Glucose-Capillary 359 (*) 70 - 99 mg/dL   Comment 1 Notify RN     Comment 2 Documented in Chart      Physical Findings: AIMS: Facial and Oral Movements Muscles of Facial Expression: None, normal Lips and Perioral Area: None, normal Jaw: None, normal Tongue: None, normal,Extremity Movements Upper (arms, wrists, hands, fingers): None, normal Lower (legs,  knees, ankles, toes): None, normal, Trunk Movements Neck, shoulders, hips: None, normal, Overall Severity Severity of abnormal movements (highest score from questions above): None, normal Incapacitation due to abnormal movements: None, normal Patient's awareness of abnormal movements (rate only patient's report): No Awareness, Dental Status Current problems with teeth and/or dentures?: No Does patient usually wear dentures?: No  CIWA:  CIWA-Ar Total: 0 COWS:     Treatment Plan Summary: Daily contact with patient to assess and evaluate symptoms and progress in treatment Medication management  Plan: We will continue his current plan of care, and tried to get him some reading material so he can study on his own about his disease addiction.  Medical Decision Making Problem Points:  Established problem, stable/improving (1) and Review of psycho-social stressors (1) Data Points:  Review or order clinical lab tests (1) Review of medication regiment & side effects (2)  I certify that inpatient services furnished can reasonably be expected to improve the patient's condition.   Wretha Laris 12/09/2012, 5:47 PM

## 2012-12-09 NOTE — Progress Notes (Signed)
Patient ID: James [name], male   DOB: 1961-03-27, 52 y.o.   MRN: [id number]  D: Pt has been very flat and depressed on the unit. Pt slept most of the morning, he reported that he did not get any sleep last night. Pt reported that he was accepted at [facility], however bed want be available until the 15th. Pt reported that he needs help because he can not go home, because if he goes home he will not make it back.Pt reported being negative SI/HI, no AH/VH noted. A: 15 min checks continued for patient safety. R: Pts safety maintained.

## 2012-12-09 NOTE — [facility] Group Notes (Signed)
[facility] Group Notes:  (Nursing/MHT/Case Management/Adjunct)  Date:  12/09/2012  Time:  1:33 PM  Type of Therapy:  Psychoeducational Skills  Participation Level:  Active  Participation Quality:  Appropriate  Affect:  Appropriate  Cognitive:  Appropriate  Insight:  Appropriate  Engagement in Group:  Engaged  Modes of Intervention:  Problem-solving  Summary of Progress/Problems: Pt attended self inventory group, and engaged in treatment. Pt reported in group that he was accepted at [facility], however his bed want be ready until the 15th. Pt reported that he can not go home and that if he goes home he feels like he want make it back.   Jacquelyne Balint Shanta 12/09/2012, 1:33 PM

## 2012-12-09 NOTE — [facility] Group Notes (Signed)
[facility] Group Notes:  (Clinical Social Work)  12/09/2012     10-11AM  Summary of Progress/Problems:   The main focus of today's process group was for the patient to identify ways in which they have in the past sabotaged their own recovery. Motivational Interviewing was utilized to ask the group members what they get out of their substance use, and what reasons they may have for wanting to change.  The Stages of Change were explained using a handout, and patients identified where they currently are with regard to stages of change.  The patient expressed that he uses crack cocaine and alcohol to block everything out and to be able to not care about anything.  He feels he is chasing that first high, and has never been able to find it.  He has been married over 30 years and she has stuck with him through a lot of this behavior, but now is saying she is going to leave if there is not as change.  He stated that he wants to change, and that he is tired of this lifestyle, that it is not fun anymore, is just painful.  He is in the Decision Stage, entering into Preparation Stage.  Type of Therapy:  Group Therapy - Process   Participation Level:  Minimal  Participation Quality:  Drowsy  Affect:  Blunted  Cognitive:  Oriented  Insight:  Developing/Improving  Engagement in Therapy:  Developing/Improving  Modes of Intervention:  Education, Support and Processing, Motivational Interviewing  [name], LCSW 12/09/2012, 12:52 PM

## 2012-12-09 NOTE — Progress Notes (Signed)
Adult Psychoeducational Group Note  Date:  12/09/2012 Time:  4:20 PM  Group Topic/Focus:  Healthy Communication:   The focus of this group is to discuss communication, barriers to communication, as well as healthy ways to communicate with others.  Participation Level:  Active  Participation Quality:  Appropriate  Affect:  Appropriate  Cognitive:  Appropriate  Insight: Appropriate  Engagement in Group:  Engaged  Modes of Intervention:  Activity Discussion   Additional Comments: Pt actively participated in communication activity.   Roanna Banning, [place] N 12/09/2012, 4:20 PM

## 2012-12-10 LAB — GLUCOSE, CAPILLARY: Glucose-Capillary: 86 mg/dL (ref 70–99)

## 2012-12-10 NOTE — Progress Notes (Signed)
D.  Pt pleasant on approach, denies complaints at this time.  Positive for evening AA group, see group notes.  Interacting appropriately with peers on unit.  Denies SI/HI/hallucinations at this time.  Denies s/s of withdrawal this evening.  A.  Support and encouragement offered  R.  Pt remains safe on unit, will continue to monitor

## 2012-12-10 NOTE — Progress Notes (Signed)
[facility] MD Progress Note  12/10/2012 2:58 PM James [name]  MRN:  [id number] Subjective:  James [name] continues to endorse a significant amount of depression anxiety, and rates both of them as an 8 on a scale of 1-10 where 10 is the worst. He denies any suicidal or homicidal ideation. He denies any artery or visual hallucinations. He endorses good sleep and appetite. He denies any cravings or withdrawal. He does express concern that he has no place to stay, and if she leaves here before he is able to go to [facility] he is likely to relapse.  Diagnosis:   DSM5: Schizophrenia Disorders:   Obsessive-Compulsive Disorders:   Trauma-Stressor Disorders:   Substance/Addictive Disorders:  Alcohol Related Disorder - Severe (303.90) Depressive Disorders:  Major Depressive Disorder - Severe (296.23)  Axis I: Alcohol Abuse and Major Depression, Recurrent severe Axis II: Deferred Axis III:  Past Medical History  Diagnosis Date  . Diabetes mellitus without complication     ADL's:  Intact  Sleep: Good  Appetite:  Good  Suicidal Ideation:  Patient denies any thought, plan, or intent Homicidal Ideation:  Patient denies any thought, plan, or intent AEB (as evidenced by):  Psychiatric Specialty Exam: Review of Systems  Constitutional: Negative.   HENT: Negative.   Eyes: Negative.   Respiratory: Negative.   Cardiovascular: Negative.   Gastrointestinal: Negative.   Genitourinary: Negative.   Skin: Negative.   Neurological: Negative.   Endo/Heme/Allergies: Negative.   Psychiatric/Behavioral: Positive for depression. Negative for suicidal ideas. The patient is nervous/anxious. The patient does not have insomnia.     Blood pressure 117/72, pulse 85, temperature 97.4 F (36.3 C), temperature source Oral, resp. rate 16, height 6' (1.829 m), weight 67.132 kg (148 lb), SpO2 100.00%.Body mass index is 20.07 kg/(m^2).  General Appearance: Casual  Eye Contact::  Good  Speech:  Clear and Coherent  Volume:   Normal  Mood:  Anxious and Dysphoric  Affect:  Congruent  Thought Process:  Linear  Orientation:  Full (Time, Place, and Person)  Thought Content:  WDL  Suicidal Thoughts:  No  Homicidal Thoughts:  No  Memory:  Immediate;   Fair Recent;   Fair Remote;   Fair  Judgement:  Fair  Insight:  Lacking  Psychomotor Activity:  Normal  Concentration:  Fair  Recall:  Good  Akathisia:  No  Handed:  Right  AIMS (if indicated):     Assets:  Communication Skills Desire for Improvement  Sleep:  Number of Hours: 6   Current Medications: Current Facility-Administered Medications  Medication Dose Route Frequency Provider Last Rate Last Dose  . acetaminophen (TYLENOL) tablet 650 mg  650 mg Oral Q6H PRN Kerry Hough, PA-C      . alum & mag hydroxide-simeth (MAALOX/MYLANTA) 200-200-20 MG/5ML suspension 30 mL  30 mL Oral Q4H PRN Kerry Hough, PA-C      . buPROPion (WELLBUTRIN XL) 24 hr tablet 150 mg  150 mg Oral Daily Rachael Fee, MD   150 mg at 12/10/12 4098  . chlordiazePOXIDE (LIBRIUM) capsule 25 mg  25 mg Oral QID PRN Kerry Hough, PA-C   25 mg at 12/09/12 2155  . insulin aspart (novoLOG) injection 0-15 Units  0-15 Units Subcutaneous TID WC Kerry Hough, PA-C   3 Units at 12/10/12 1144  . insulin aspart (novoLOG) injection 0-5 Units  0-5 Units Subcutaneous QHS Kerry Hough, PA-C   2 Units at 12/07/12 0232  . insulin aspart protamine- aspart (NOVOLOG MIX 70/30)  injection 10 Units  10 Units Subcutaneous Q supper Sanjuana Kava, NP   10 Units at 12/09/12 1720  . insulin glargine (LANTUS) injection 10 Units  10 Units Subcutaneous QHS Kerry Hough, PA-C   10 Units at 12/09/12 2157  . magnesium hydroxide (MILK OF MAGNESIA) suspension 30 mL  30 mL Oral Daily PRN Kerry Hough, PA-C      . metFORMIN (GLUCOPHAGE) tablet 1,000 mg  1,000 mg Oral BID WC Kerry Hough, PA-C   1,000 mg at 12/10/12 0904  . traZODone (DESYREL) tablet 50 mg  50 mg Oral QHS,MR X 1 Kerry Hough, PA-C   50 mg  at 12/09/12 2155    Lab Results:  Results for orders placed during the hospital encounter of 12/07/12 (from the past 48 hour(s))  GLUCOSE, CAPILLARY     Status: Abnormal   Collection Time    12/08/12  5:01 PM      Result Value Range   Glucose-Capillary 284 (*) 70 - 99 mg/dL  GLUCOSE, CAPILLARY     Status: None   Collection Time    12/08/12  9:45 PM      Result Value Range   Glucose-Capillary 74  70 - 99 mg/dL  GLUCOSE, CAPILLARY     Status: Abnormal   Collection Time    12/09/12  6:34 AM      Result Value Range   Glucose-Capillary 141 (*) 70 - 99 mg/dL  GLUCOSE, CAPILLARY     Status: Abnormal   Collection Time    12/09/12 11:50 AM      Result Value Range   Glucose-Capillary 194 (*) 70 - 99 mg/dL   Comment 1 Notify RN    GLUCOSE, CAPILLARY     Status: Abnormal   Collection Time    12/09/12  4:47 PM      Result Value Range   Glucose-Capillary 359 (*) 70 - 99 mg/dL   Comment 1 Notify RN     Comment 2 Documented in Chart    GLUCOSE, CAPILLARY     Status: Abnormal   Collection Time    12/09/12  9:34 PM      Result Value Range   Glucose-Capillary 141 (*) 70 - 99 mg/dL   Comment 1 Notify RN     Comment 2 Documented in Chart    GLUCOSE, CAPILLARY     Status: Abnormal   Collection Time    12/10/12  6:16 AM      Result Value Range   Glucose-Capillary 220 (*) 70 - 99 mg/dL  GLUCOSE, CAPILLARY     Status: Abnormal   Collection Time    12/10/12 11:38 AM      Result Value Range   Glucose-Capillary 166 (*) 70 - 99 mg/dL    Physical Findings: AIMS: Facial and Oral Movements Muscles of Facial Expression: None, normal Lips and Perioral Area: None, normal Jaw: None, normal Tongue: None, normal,Extremity Movements Upper (arms, wrists, hands, fingers): None, normal Lower (legs, knees, ankles, toes): None, normal, Trunk Movements Neck, shoulders, hips: None, normal, Overall Severity Severity of abnormal movements (highest score from questions above): None,  normal Incapacitation due to abnormal movements: None, normal Patient's awareness of abnormal movements (rate only patient's report): No Awareness, Dental Status Current problems with teeth and/or dentures?: No Does patient usually wear dentures?: No  CIWA:  CIWA-Ar Total: 1 COWS:     Treatment Plan Summary: Daily contact with patient to assess and evaluate symptoms and progress in treatment Medication  management  Plan: Continue current plan of care and research earlier options for residential substance abuse treatment  Medical Decision Making Problem Points:  Established problem, stable/improving (1) and Review of psycho-social stressors (1) Data Points:  Review or order clinical lab tests (1) Review of medication regiment & side effects (2)  I certify that inpatient services furnished can reasonably be expected to improve the patient's condition.   Susana Gripp 12/10/2012, 2:58 PM

## 2012-12-10 NOTE — [facility] Group Notes (Signed)
[facility] Group Notes:  (Nursing/MHT/Case Management/Adjunct)  Date:  12/10/2012  Time:  10:31 AM  Type of Therapy: Psychoeducational Skills  Participation Level: Active  Participation Quality: Appropriate  Affect: Appropriate  Cognitive: Appropriate  Insight: Appropriate  Engagement in Group: Engaged  Modes of Intervention: Problem-solving  Summary of Progress/Problems: Pt attended self inventory group, and engaged in treatment.  James [name] Shanta 12/10/2012, 10:31 AM 

## 2012-12-10 NOTE — Progress Notes (Signed)
[facility] Group Notes:  (Nursing/MHT/Case Management/Adjunct)  Date:  12/09/2012   Time: 2100 Type of Therapy:  wrap up group  Participation Level:  Active  Participation Quality:  Appropriate, Attentive, Sharing and Supportive  Affect:  Appropriate  Cognitive:  Appropriate  Insight:  Good  Engagement in Group:  Engaged  Modes of Intervention:  Clarification, Education and Support  Summary of Progress/Problems: Pt reports feeling alone and without family right now.  His wife is not taking his calls and wont let him talk to his kids. Pt shares that after what he did he deserves and expects this treatment but feels guilty and wants to talk to them.  Pt reports being emotional and sorry and wants to turn around for his family. Pt has his first grandchild due in January and wants to be there for them.  Pt plans in attending [facility].   [name] 12/10/2012, 4:14 AM

## 2012-12-10 NOTE — [facility] Group Notes (Signed)
[facility] Group Notes:  (Clinical Social Work)  12/10/2012  10:00-11:00AM  Summary of Progress/Problems:   The main focus of today's process group was to   identify the patient's current support system and decide on other supports that can be put in place.  The picture on workbook was used to discuss why additional supports are needed, and a hand-out was distributed with four definitions/levels of support, then used to talk about how patients have given and received all different kinds of support.  An emphasis was placed on using counselor, doctor, therapy groups, 12-step groups, and problem-specific support groups to expand supports.  The patient identified one additional support as being transfer to a 30-day rehab program from this hospitalization.  He stated he is tired of doing this for 25 years, and has always before tried to accomplish sobriety on his own.  This time he wants help.  Type of Therapy:  Process Group with Motivational Interviewing  Participation Level:  Active  Participation Quality:  Attentive and Sharing  Affect:  Depressed and Flat  Cognitive:  Appropriate and Oriented  Insight:  Developing/Improving  Engagement in Therapy:  Engaged  Modes of Intervention:   Education, Support and Processing, Activity  [employer], LCSW 12/10/2012, 1:08 PM

## 2012-12-10 NOTE — Progress Notes (Signed)
Patient ID: LEEVON [name], male   DOB: Sep 03, 1960, 52 y.o.   MRN: [id number]  D: Pt has been very flat and depressed on the unit today, he reported that his wife want answer his phone calls and he can't talk to his kids. Pt reported that he was hurt, but still trying to stay focused on treatment. Pt has attended groups and has engaged in treatment.  Pt reported being negative SI/HI, no AH/VH noted. A: 15 min checks continued for patient safety. R: Pts safety maintained.

## 2012-12-11 DIAGNOSIS — F332 Major depressive disorder, recurrent severe without psychotic features: Secondary | ICD-10-CM

## 2012-12-11 LAB — GLUCOSE, CAPILLARY
Glucose-Capillary: 167 mg/dL — ABNORMAL HIGH (ref 70–99)
Glucose-Capillary: 120 mg/dL — ABNORMAL HIGH (ref 70–99)

## 2012-12-11 MED ORDER — TRAZODONE HCL 50 MG PO TABS: 50.0000 mg | ORAL_TABLET | Freq: Every evening | ORAL | Status: DC | PRN

## 2012-12-11 MED ORDER — METFORMIN HCL 1000 MG PO TABS: 1000.0000 mg | ORAL_TABLET | Freq: Two times a day (BID) | ORAL | Status: DC

## 2012-12-11 MED ORDER — INSULIN GLARGINE 100 UNIT/ML ~~LOC~~ SOLN: 10.0000 [IU] | Freq: Every day | SUBCUTANEOUS | Status: DC

## 2012-12-11 MED ORDER — BUPROPION HCL ER (XL) 150 MG PO TB24: 150.0000 mg | ORAL_TABLET | Freq: Every day | ORAL | Status: DC

## 2012-12-11 MED ORDER — INSULIN ASPART PROT & ASPART (70-30 MIX) 100 UNIT/ML ~~LOC~~ SUSP: 10.0000 [IU] | Freq: Every day | SUBCUTANEOUS | Status: DC

## 2012-12-11 NOTE — [facility] Suicide Risk Assessment (Signed)
Suicide Risk Assessment  Discharge Assessment     Demographic Factors:  Male  Mental Status Per Nursing Assessment::   On Admission:  Suicidal ideation indicated by others;Self-harm thoughts  Current Mental Status by Physician: In full contact with reality. There are no suicidal ideas, plans or intent. Mood is worried, affect is appropriate. He has a bed at [facility] September 15. He will be staying with family and calling [facility] everyday for a bed. His wife is still upset with him and he can understand why.   Loss Factors: Loss of significant relationship  Historical Factors: NA  Risk Reduction Factors:   Sense of responsibility to family  Continued Clinical Symptoms:  Depression:   Comorbid alcohol abuse/dependence Alcohol/Substance Abuse/Dependencies  Cognitive Features That Contribute To Risk:  Polarized thinking Thought constriction (tunnel vision)    Suicide Risk:  Minimal: No identifiable suicidal ideation.  Patients presenting with no risk factors but with morbid ruminations; may be classified as minimal risk based on the severity of the depressive symptoms  Discharge Diagnoses:   AXIS I:  Cocaine, Alcohol Dependence, Depressive Disorder NOS AXIS II:  Deferred AXIS III:   Past Medical History  Diagnosis Date  . Diabetes mellitus without complication    AXIS IV:  other psychosocial or environmental problems AXIS V:  61-70 mild symptoms  Plan Of Care/Follow-up recommendations:  Activity:  as tolerated Diet:  regular Go to meetings while waiting for a bed to come available at [facility] or wait to get into [facility] on the 15 of September Is patient on multiple antipsychotic therapies at discharge:  No   Has Patient had three or more failed trials of antipsychotic monotherapy by history:  No  Recommended Plan for Multiple Antipsychotic Therapies: NA  Teretha Chalupa A 12/11/2012, 12:33 PM

## 2012-12-11 NOTE — Progress Notes (Signed)
Patient ID: James [name], male   DOB: [birth year], 52 y.o.   MRN: [id number]   Pt was discharged home with his brother-in-law, pt reported that he would follow up with [facility] to see if he could get a bed until he can go to [facility] on the 15th. Pt reported being negative SI/HI, no AH/VH noted.

## 2012-12-11 NOTE — Discharge Summary (Signed)
Physician Discharge Summary Note  Patient:  James [name] is an 52 y.o., male MRN:  [id number] DOB:  31-Jul-1960 Patient phone:  [phone] (home)  Patient address:   [address] [place] [address],   Date of Admission:  12/07/2012 Date of Discharge: 12/11/12  Reason for Admission:  Drug/alcohol detox  Discharge Diagnoses: Principal Problem:   Alcohol withdrawal Active Problems:   Cocaine dependence   Major depression  Review of Systems  Constitutional: Negative.   HENT: Negative.   Eyes: Negative.   Respiratory: Negative.   Cardiovascular: Negative.   Gastrointestinal: Negative.   Genitourinary: Negative.   Musculoskeletal: Negative.   Skin: Negative.   Neurological: Negative.   Endo/Heme/Allergies: Negative.   Psychiatric/Behavioral: Positive for depression (Stabilized with medication prior to discharge) and substance abuse (Alcoholism, cocaine dependence). Negative for suicidal ideas, hallucinations and memory loss. The patient is nervous/anxious (Stabilized with medication prior to discharge) and has insomnia (Stabilized with medication prior to discharge).    DSM5: Schizophrenia Disorders:  NA Obsessive-Compulsive Disorders:  NA Trauma-Stressor Disorders:  NA Substance/Addictive Disorders:  Alcohol Related Disorder - Severe (303.90) and Alcohol Withdrawal (291.81), Cocaine dependence Depressive Disorders:  Major Depressive Disorder - Severe (296.23)  Axis Diagnosis:   AXIS I:  Major Depression, Recurrent severe and Alcohol Related Disorder - Severe (303.90) and Alcohol Withdrawal (291.81) AXIS II:  Deferred AXIS III:   Past Medical History  Diagnosis Date  . Diabetes mellitus without complication    AXIS IV:  Substance abuse issues AXIS V:  64  Level of Care:  OP  Hospital Course:  History of Present Illness: This is a 52 year old African-American male. Patient is a walk-in admit to [facility], medically cleared at the [facility]. Patient  reports, "I came in to this hospital yesterday because, I needed some help for my substance abuse issues. I want to get to the bottom of why I keep relapsing on alcohol and crack cocaine. I have been using drugs and drinking too much alcohol for the last 25 or more years, off and on. However, my substance abuse/use has worsened in the last 3 days. I use crack and drink too much alcohol because I have an addiction. I was once sober x 5 years, and that was 4 years ago. Then, my father died, I relapsed. I would like to be sent to a Residential treatment Center after my detoxification treatment in the hospital. My substance use has caused me a lot of personal and familial problems. I'm about to lose my wife, if I have not already lost her by now. I have been depressed all week. I was contemplating on drinking something yesterday to just get it over with. I'm tired of living this way".  James [name] came in to the hospital with UDS report indicating positive cocaine in his systems. Although, he claimed has been drinking a lot of alcohol as well. And because cocaine has no established detoxification treatment protocol, he was ordered no detoxification treatment protocol. However, he was also on librium 25 mg on a PRN basis to combat any withdrawal symptoms of alcohol that he may be experiencing. He was also ordered and received Trazodone 50 mg at bedtime for sleep and Wellbutrin XL 150 mg daily for depression. James [name] was enrolled in group counseling sessions/activities where he was taught, counseled and learned coping skills that should help him cope better after discharge and to manage his substance abuse issues for a much longer sobriety. He was enrolled and participated  in the AA/NA meetings being offered and held within this unit as well due to his history of substance abuse issues. James [name] presented other medical issues and concerns that required treatment and or monitoring. He received medication management for all  those health issues as well. He tolerated his treatment regimen without any significant adverse effects and or reactions reported.  Patient did respond positively to his treatment regimen. This is evidenced by his daily reports of improved mood, reduction of symptoms and presentation of good affect/eye contact. He attended treatment team meeting this am and met with his treatment team members. His reason for admission, present symptoms, response to treatment and discharge plans discussed with patient. James [name] endorsed that his symptoms has stabilized and that he is ready for discharge to pursue Residential treatment Center for further treatment. It was then agreed upon that patient will continue substance abuse care at the [facility] [place] at 08:00 am. He is instructed to call [facility] on daily basis starting at around 09:15 am for a bed availability. And if a bed becomes available, he will move in to start treatment. For medication management and routine psychiatric follow-up care, patient will start at [facility] here in [place], [place] on 12/13/12. He has been instructed that this is walk-in appointment. The addresses, dates, times and contact information for these clinics provided for patient in writing.  Upon discharge, James [name] adamantly denies any suicidal, homicidal ideations, auditory, visual hallucinations, paranoia and or delusional thoughts. He was provided with 14 days worth supply samples of his [facility] discharge medications. He left [facility] with all personal belongings in no apparent distress. Transportation friend.  Consults:  psychiatry  Significant Diagnostic Studies:  labs: CBC with diff, CMP, UDS, Toxicology tests, U/A  Discharge Vitals:   Blood pressure 121/71, pulse 91, temperature 97.5 F (36.4 C), temperature source Oral, resp. rate 18, height 6' (1.829 m), weight 67.132 kg (148 lb), SpO2 100.00%. Body mass index is 20.07  kg/(m^2). Lab Results:   Results for orders placed during the hospital encounter of 12/07/12 (from the past 72 hour(s))  GLUCOSE, CAPILLARY     Status: Abnormal   Collection Time    12/08/12  5:01 PM      Result Value Range   Glucose-Capillary 284 (*) 70 - 99 mg/dL  GLUCOSE, CAPILLARY     Status: None   Collection Time    12/08/12  9:45 PM      Result Value Range   Glucose-Capillary 74  70 - 99 mg/dL  GLUCOSE, CAPILLARY     Status: Abnormal   Collection Time    12/09/12  6:34 AM      Result Value Range   Glucose-Capillary 141 (*) 70 - 99 mg/dL  GLUCOSE, CAPILLARY     Status: Abnormal   Collection Time    12/09/12 11:50 AM      Result Value Range   Glucose-Capillary 194 (*) 70 - 99 mg/dL   Comment 1 Notify RN    GLUCOSE, CAPILLARY     Status: Abnormal   Collection Time    12/09/12  4:47 PM      Result Value Range   Glucose-Capillary 359 (*) 70 - 99 mg/dL   Comment 1 Notify RN     Comment 2 Documented in Chart    GLUCOSE, CAPILLARY     Status: Abnormal   Collection Time    12/09/12  9:34 PM      Result  Value Range   Glucose-Capillary 141 (*) 70 - 99 mg/dL   Comment 1 Notify RN     Comment 2 Documented in Chart    GLUCOSE, CAPILLARY     Status: Abnormal   Collection Time    12/10/12  6:16 AM      Result Value Range   Glucose-Capillary 220 (*) 70 - 99 mg/dL  GLUCOSE, CAPILLARY     Status: Abnormal   Collection Time    12/10/12 11:38 AM      Result Value Range   Glucose-Capillary 166 (*) 70 - 99 mg/dL  GLUCOSE, CAPILLARY     Status: Abnormal   Collection Time    12/10/12  4:55 PM      Result Value Range   Glucose-Capillary 292 (*) 70 - 99 mg/dL   Comment 1 Notify RN    GLUCOSE, CAPILLARY     Status: None   Collection Time    12/10/12  9:25 PM      Result Value Range   Glucose-Capillary 86  70 - 99 mg/dL  GLUCOSE, CAPILLARY     Status: Abnormal   Collection Time    12/11/12  6:17 AM      Result Value Range   Glucose-Capillary 167 (*) 70 - 99 mg/dL  GLUCOSE,  CAPILLARY     Status: Abnormal   Collection Time    12/11/12 11:45 AM      Result Value Range   Glucose-Capillary 120 (*) 70 - 99 mg/dL    Physical Findings: AIMS: Facial and Oral Movements Muscles of Facial Expression: None, normal Lips and Perioral Area: None, normal Jaw: None, normal Tongue: None, normal,Extremity Movements Upper (arms, wrists, hands, fingers): None, normal Lower (legs, knees, ankles, toes): None, normal, Trunk Movements Neck, shoulders, hips: None, normal, Overall Severity Severity of abnormal movements (highest score from questions above): None, normal Incapacitation due to abnormal movements: None, normal Patient's awareness of abnormal movements (rate only patient's report): No Awareness, Dental Status Current problems with teeth and/or dentures?: No Does patient usually wear dentures?: No  CIWA:  CIWA-Ar Total: 1 COWS:     Psychiatric Specialty Exam: See Psychiatric Specialty Exam and Suicide Risk Assessment completed by Attending Physician prior to discharge.  Discharge destination:  Home  Is patient on multiple antipsychotic therapies at discharge:  No   Has Patient had three or more failed trials of antipsychotic monotherapy by history:  No  Recommended Plan for Multiple Antipsychotic Therapies: NA     Medication List       Indication   buPROPion 150 MG 24 hr tablet  Commonly known as:  WELLBUTRIN XL  Take 1 tablet (150 mg total) by mouth daily. For depression   Indication:  Major Depressive Disorder     insulin aspart protamine- aspart (70-30) 100 UNIT/ML injection  Commonly known as:  NOVOLOG MIX 70/30  Inject 0.1 mLs (10 Units total) into the skin daily with supper. For diabetes control   Indication:  Type 2 Diabetes     insulin glargine 100 UNIT/ML injection  Commonly known as:  LANTUS  Inject 0.1 mLs (10 Units total) into the skin at bedtime. For diabetes control   Indication:  Type 2 Diabetes     metFORMIN 1000 MG tablet   Commonly known as:  GLUCOPHAGE  Take 1 tablet (1,000 mg total) by mouth 2 (two) times daily with a meal. For diabetes management   Indication:  Type 2 Diabetes     traZODone 50 MG tablet  Commonly known as:  DESYREL  Take 1 tablet (50 mg total) by mouth at bedtime and may repeat dose one time if needed. For sleep   Indication:  Trouble Sleeping       Follow-up Information   Follow up with [facility] On 12/25/2012. (Arrive at 8:00 am promptly for assessment for possible admission.  Bring ID and 30 day supply of meds)    Contact information:   [address] [place], [place] [address] Phone: [phone] Fax: [phone]      Follow up with [facility]. (Referral sent. Call daily at 9:15AM to check for bed availability. Make sure to ask for TREATMENT bed. )    Contact information:   [address] [place], [place] [address] Phone: [phone] Fax: [phone]      Follow up with [facility] On 12/13/2012. (Walk in on this date for hospital discharge appointment.  Walk in clinic is Monday - Friday 8 am - 3 pm.  )    Contact information:   [address], [place] [address] Phone: [phone] Fax: [phone]     Follow-up recommendations:  Activity:  As tolerated Diet: As recommended by your primary care doctor. Keep all scheduled follow-up appointments as recommended. Continue to work your relapse prevention plan Comments: Take all your medications as prescribed by your mental healthcare provider. Report any adverse effects and or reactions from your medicines to your outpatient provider promptly. Patient is instructed and cautioned to not engage in alcohol and or illegal drug use while on prescription medicines. In the event of worsening symptoms, patient is instructed to call the crisis hotline, 911 and or go to the nearest ED for appropriate evaluation and treatment of symptoms. Follow-up with your primary care provider for your other medical issues, concerns and or  health care needs.    Total Discharge Time:  Greater than 30 minutes.  Signed: Sanjuana Kava, PMHNP-BC 12/11/2012, 12:15 PM Agree with assessment and plan Madie Reno A. [place], [email or web address].D

## 2012-12-11 NOTE — Progress Notes (Signed)
[facility] Adult Case Management Discharge Plan :  Will you be returning to the same living situation after discharge: Yes,  staying with friend At discharge, do you have transportation home?:Yes,  friend Do you have the ability to pay for your medications:Yes,  mental health  Release of information consent forms completed and in the chart;  Patient's signature needed at discharge.  Patient to Follow up at: Follow-up Information   Follow up with [facility] On 12/25/2012. (Arrive at 8:00 am promptly for assessment for possible admission.  Bring ID and 30 day supply of meds)    Contact information:   [address] [place], [place] [address] Phone: [phone] Fax: [phone]      Follow up with [facility]. (Referral sent. Call daily at 9:15AM to check for bed availability. Make sure to ask for TREATMENT bed. )    Contact information:   [address] [place], [place] [address] Phone: [phone] Fax: [phone]      Follow up with [facility] On 12/13/2012. (Walk in on this date for hospital discharge appointment.  Walk in clinic is Monday - Friday 8 am - 3 pm.  )    Contact information:   [address], [place] [address] Phone: [phone] Fax: [phone]      Patient denies SI/HI:   Yes,  during group/self report.     Safety Planning and Suicide Prevention discussed:  Yes,  SPE completed with pt's friend Dwyane Dee. SPI pamphlet provided to pt and he was encouraged to ask questions and share with his support system.  Smart, Keyerra Lamere 12/11/2012, 11:20 AM

## 2012-12-11 NOTE — Progress Notes (Signed)
Patient ID: James [name], male   DOB: 02-16-1961, 52 y.o.   MRN: [id number]  D: Pt has been flat on the unit today, pt reported that he still can not get in touch with his wife and children. Pt reported that he has no where to go after discharge. Pt was seen by the doctor, then he reported that he was going to be discharged and that he was going to live with his brother-in-law. Pt reported that he felt like that was a good plan. Pt reports being negative SI/HI, no AH/VH noted. A: 15 min checks continued for patient safety. R: Pts safety maintained.

## 2012-12-11 NOTE — Progress Notes (Signed)
Patient did attend the evening speaker AA meeting.  

## 2012-12-14 NOTE — Progress Notes (Addendum)
Patient Discharge Instructions:  After Visit Summary (AVS):   Faxed to:  12/14/12 Discharge Summary Note:   Faxed to:  12/14/12 Psychiatric Admission Assessment Note:   Faxed to:  12/14/12 Suicide Risk Assessment - Discharge Assessment:   Faxed to:  12/14/12 Faxed/Sent to the Next Level Care provider:  12/14/12 Faxed to [facility] @ [phone] Faxed to [facility] @ [phone] Faxed to [facility] @ [phone]  Jerelene Redden, 12/14/2012, 3:08 PM

## 2013-12-30 ENCOUNTER — Emergency Department (INDEPENDENT_AMBULATORY_CARE_PROVIDER_SITE_OTHER)
Admission: EM | Admit: 2013-12-30 | Discharge: 2013-12-30 | Disposition: A | Discharge status: Home / Self Care | Payer: Self-pay | Source: Home / Self Care | Attending: Family Medicine | Admitting: Family Medicine

## 2013-12-30 ENCOUNTER — Encounter (HOSPITAL_COMMUNITY): Payer: Self-pay | Admitting: Emergency Medicine

## 2013-12-30 DIAGNOSIS — M654 Radial styloid tenosynovitis [de Quervain]: Secondary | ICD-10-CM

## 2013-12-30 MED ORDER — MELOXICAM 15 MG PO TABS: 15.0000 mg | ORAL_TABLET | Freq: Every day | ORAL | Status: DC

## 2013-12-30 NOTE — Discharge Instructions (Signed)
De Quervain's Tenosynovitis De Quervain's tenosynovitis involves inflammation of one or two tendon linings (sheaths) or strain of one or two tendons to the thumb: extensor pollicis brevis (EPB), or abductor pollicis longus (APL). This causes pain on the side of the wrist and base of the thumb. Tendon sheaths secrete a fluid that lubricates the tendon, allowing the tendon to move smoothly. When the sheath becomes inflamed, the tendon cannot move freely in the sheath. Both the EPB and APL tendons are important for proper use of the hand. The EPB tendon is important for straightening the thumb. The APL tendon is important for moving the thumb away from the index finger (abducting). The two tendons pass through a small tube (canal) in the wrist, near the base of the thumb. When the tendons become inflamed, pain is usually felt in this area. SYMPTOMS   Pain, tenderness, swelling, warmth, or redness over the base of the thumb and thumb side of the wrist.  Pain that gets worse when straightening the thumb.  Pain that gets worse when moving the thumb away from the index finger, against resistance.  Pain with pinching or gripping.  Locking or catching of the thumb.  Limited motion of the thumb.  Crackling sound (crepitation) when the tendon or thumb is moved or touched.  Fluid-filled cyst in the area of the base of the thumb. CAUSES   Tenosynovitis is often linked with overuse of the wrist.  Tenosynovitis may be caused by repeated injury to the thumb muscle and tendon units, and with repeated motions of the hand and wrist, due to friction of the tendon within the lining (sheath).  Tenosynovitis may also be due to a sudden increase in activity or change in activity. RISK INCREASES WITH:  Sports that involve repeated hand and wrist motions (golf, bowling, tennis, squash, racquetball).  Heavy labor.  Poor physical wrist strength and flexibility.  Failure to warm up properly before practice or  play.  Male gender.  New mothers who hold their baby's head for long periods or lift infants with thumbs in the infant's armpit (axilla). PREVENTION  Warm up and stretch properly before practice or competition.  Allow enough time for rest and recovery between practices and competition.  Maintain appropriate conditioning:  Cardiovascular fitness.  Forearm, wrist, and hand flexibility.  Muscle strength and endurance.  Use proper exercise technique. PROGNOSIS  This condition is usually curable within 6 weeks, if treated properly with non-surgical treatment and resting of the affected area.  RELATED COMPLICATIONS   Longer healing time if not properly treated or if not given enough time to heal.  Chronic inflammation, causing recurring symptoms of tenosynovitis. Permanent pain or restriction of movement.  Risks of surgery: infection, bleeding, injury to nerves (numbness of the thumb), continued pain, incomplete release of the tendon sheath, recurring symptoms, cutting of the tendons, tendons sliding out of position, weakness of the thumb, thumb stiffness. TREATMENT  First, treatment involves the use of medicine and ice, to reduce pain and inflammation. Patients are encouraged to stop or modify activities that aggravate the injury. Stretching and strengthening exercises may be advised. Exercises may be completed at home or with a therapist. You may be fitted with a brace or splint, to limit motion and allow the injury to heal. Your caregiver may also choose to give you a corticosteroid injection, to reduce the pain and inflammation. If non-surgical treatment is not successful, surgery may be needed. Most tenosynovitis surgeries are done as outpatient procedures (you go home the   same day). Surgery may involve local, regional (whole arm), or general anesthesia.  MEDICATION   If pain medicine is needed, nonsteroidal anti-inflammatory medicines (aspirin and ibuprofen), or other minor pain  relievers (acetaminophen), are often advised.  Do not take pain medicine for 7 days before surgery.  Prescription pain relievers are often prescribed only after surgery. Use only as directed and only as much as you need.  Corticosteroid injections may be given if your caregiver thinks they are needed. There is a limited number of times these injections may be given. COLD THERAPY   Cold treatment (icing) should be applied for 10 to 15 minutes every 2 to 3 hours for inflammation and pain, and immediately after activity that aggravates your symptoms. Use ice packs or an ice massage. SEEK MEDICAL CARE IF:   Symptoms get worse or do not improve in 2 to 4 weeks, despite treatment.  You experience pain, numbness, or coldness in the hand.  Blue, gray, or dark color appears in the fingernails.  Any of the following occur after surgery: increased pain, swelling, redness, drainage of fluids, bleeding in the affected area, or signs of infection.  New, unexplained symptoms develop. (Drugs used in treatment may produce side effects.) Document Released: 03/29/2005 Document Revised: 06/21/2011 Document Reviewed: 07/11/2008 Exit[employer] Patient Information 2015 [employer]. This information is not intended to replace advice given to you by your health care provider. Make sure you discuss any questions you have with your health care provider.   

## 2013-12-30 NOTE — ED Notes (Signed)
Patient complains of pain to his left hand for about a month Can not recall any injury

## 2013-12-30 NOTE — ED Provider Notes (Signed)
CSN: [id number]     Arrival date & time 12/30/13  1505 History   First MD Initiated Contact with Patient 12/30/13 1525     Chief Complaint  Patient presents with  . Hand Pain   (Consider location/radiation/quality/duration/timing/severity/associated sxs/prior Treatment) HPI   53 year old male presents for evaluation of left wrist pain. For about 3-4 weeks, he has had constant pain in his left wrist that has gotten progressively worse. The pain seems to go from his thumb up to his distal forearm, all along the radial side. 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 He has been done with the job for one week but the pain continues to worsen. The pain is worse at night. He denies any specific injury at anytime. Denies any swelling or tenderness in his hands. He has never had this before. He has been treating it at home with icy hot which is not all that helpful.  Past Medical History  Diagnosis Date  . Diabetes mellitus without complication    Past Surgical History  Procedure Laterality Date  . Finger surgery     No family history on file. History  Substance Use Topics  . Smoking status: Current Every Day Smoker -- 0.50 packs/day for 23 years    Types: Cigarettes  . Smokeless tobacco: Never Used  . Alcohol Use: 37.2 oz/week    12 Cans of beer, 50 Shots of liquor per week     Comment: 2-40 oz daily    Review of Systems  Musculoskeletal: Negative for joint swelling.       Left wrist pain, see history of present illness  All other systems reviewed and are negative.   Allergies  Review of patient's allergies indicates no known allergies.  Home Medications   Prior to Admission medications   Medication Sig Start Date End Date Taking? Authorizing Provider  buPROPion (WELLBUTRIN XL) 150 MG 24 hr tablet Take 1 tablet (150 mg total) by mouth daily. For depression 12/11/12   Sanjuana Kava, NP   insulin aspart protamine- aspart (NOVOLOG MIX 70/30) (70-30) 100 UNIT/ML injection Inject 0.1 mLs (10 Units total) into the skin daily with supper. For diabetes control 12/11/12   Sanjuana Kava, NP  insulin glargine (LANTUS) 100 UNIT/ML injection Inject 0.1 mLs (10 Units total) into the skin at bedtime. For diabetes control 12/11/12   Sanjuana Kava, NP  meloxicam (MOBIC) 15 MG tablet Take 1 tablet (15 mg total) by mouth daily. 12/30/13   Graylon Good, PA-C  metFORMIN (GLUCOPHAGE) 1000 MG tablet Take 1 tablet (1,000 mg total) by mouth 2 (two) times daily with a meal. For diabetes management 12/11/12   Sanjuana Kava, NP  traZODone (DESYREL) 50 MG tablet Take 1 tablet (50 mg total) by mouth at bedtime and may repeat dose one time if needed. For sleep 12/11/12   Sanjuana Kava, NP   BP 120/69  Pulse 95  Temp(Src) 98.8 F (37.1 C) (Oral)  Resp 18  SpO2 97% Physical Exam  Nursing note and vitals reviewed. Constitutional: He is oriented to person, place, and time. He appears well-developed and well-nourished. No distress.  HENT:  Head: Normocephalic.  Cardiovascular:  Pulses:      Radial pulses are 2+ on the right side, and 2+ on the left side.  Pulmonary/Chest: Effort normal. No respiratory distress.  Musculoskeletal:       Left wrist: He  exhibits tenderness (minimal tenderness along the radial portion of the wrist, no anatomic snuffbox tenderness). He exhibits normal range of motion, no swelling, no effusion and no deformity.  Positive Finkelstein test on the left  Neurological: He is alert and oriented to person, place, and time. Coordination normal.  Skin: Skin is warm and dry. No rash noted. He is not diaphoretic.  Psychiatric: He has a normal mood and affect. Judgment normal.    ED Course  Procedures (including critical care time) Labs Review Labs Reviewed - No data to display  Imaging Review No results found.   MDM   1. De Quervain's tenosynovitis, left    Treat with thumb spica,  MOBIC, and ice. Followup with ortho if no improvement in 2 weeks   Meds ordered this encounter  Medications  . meloxicam (MOBIC) 15 MG tablet    Sig: Take 1 tablet (15 mg total) by mouth daily.    Dispense:  30 tablet    Refill:  0    Order Specific Question:  Supervising Provider    Answer:  Clementeen Graham, Kathie Rhodes [[id number]]      Graylon Good, PA-C 12/30/13 1536

## 2014-01-01 NOTE — ED Provider Notes (Signed)
Medical screening examination/treatment/procedure(s) were performed by a resident physician or non-physician practitioner and as the supervising physician I was immediately available for consultation/collaboration.  Leilynn Pilat, MD    Winnie Barsky S Saraphina Lauderbaugh, MD 01/01/14 0748 

## 2015-05-27 ENCOUNTER — Encounter (HOSPITAL_COMMUNITY): Payer: Self-pay | Admitting: *Deleted

## 2015-05-27 ENCOUNTER — Emergency Department (HOSPITAL_COMMUNITY)
Admission: EM | Admit: 2015-05-27 | Discharge: 2015-05-28 | Disposition: A | Discharge status: Other Acute Inpatient Hospital | Payer: PRIVATE HEALTH INSURANCE | Attending: Emergency Medicine | Admitting: Emergency Medicine

## 2015-05-27 DIAGNOSIS — F329 Major depressive disorder, single episode, unspecified: Secondary | ICD-10-CM | POA: Insufficient documentation

## 2015-05-27 DIAGNOSIS — F1420 Cocaine dependence, uncomplicated: Secondary | ICD-10-CM | POA: Diagnosis present

## 2015-05-27 DIAGNOSIS — Z7984 Long term (current) use of oral hypoglycemic drugs: Secondary | ICD-10-CM | POA: Insufficient documentation

## 2015-05-27 DIAGNOSIS — Z794 Long term (current) use of insulin: Secondary | ICD-10-CM | POA: Insufficient documentation

## 2015-05-27 DIAGNOSIS — F1490 Cocaine use, unspecified, uncomplicated: Secondary | ICD-10-CM

## 2015-05-27 DIAGNOSIS — E119 Type 2 diabetes mellitus without complications: Secondary | ICD-10-CM | POA: Insufficient documentation

## 2015-05-27 DIAGNOSIS — Z791 Long term (current) use of non-steroidal anti-inflammatories (NSAID): Secondary | ICD-10-CM | POA: Insufficient documentation

## 2015-05-27 DIAGNOSIS — Z79899 Other long term (current) drug therapy: Secondary | ICD-10-CM | POA: Insufficient documentation

## 2015-05-27 DIAGNOSIS — R45851 Suicidal ideations: Secondary | ICD-10-CM

## 2015-05-27 DIAGNOSIS — F17210 Nicotine dependence, cigarettes, uncomplicated: Secondary | ICD-10-CM | POA: Insufficient documentation

## 2015-05-27 DIAGNOSIS — F1410 Cocaine abuse, uncomplicated: Secondary | ICD-10-CM | POA: Insufficient documentation

## 2015-05-27 LAB — COMPREHENSIVE METABOLIC PANEL
ALK PHOS: 66 U/L (ref 38–126)
ALT: 36 U/L (ref 17–63)
AST: 25 U/L (ref 15–41)
Albumin: 4.1 g/dL (ref 3.5–5.0)
Anion gap: 13 (ref 5–15)
BILIRUBIN TOTAL: 1 mg/dL (ref 0.3–1.2)
BUN: 13 mg/dL (ref 6–20)
CALCIUM: 9.3 mg/dL (ref 8.9–10.3)
CO2: 22 mmol/L (ref 22–32)
CREATININE: 0.85 mg/dL (ref 0.61–1.24)
Chloride: 97 mmol/L — ABNORMAL LOW (ref 101–111)
Glucose, Bld: 331 mg/dL — ABNORMAL HIGH (ref 65–99)
Potassium: 4.3 mmol/L (ref 3.5–5.1)
Sodium: 132 mmol/L — ABNORMAL LOW (ref 135–145)
TOTAL PROTEIN: 7 g/dL (ref 6.5–8.1)

## 2015-05-27 LAB — RAPID URINE DRUG SCREEN, HOSP PERFORMED
AMPHETAMINES: NOT DETECTED
BARBITURATES: NOT DETECTED
BENZODIAZEPINES: NOT DETECTED
Cocaine: POSITIVE — AB
Opiates: NOT DETECTED
TETRAHYDROCANNABINOL: NOT DETECTED

## 2015-05-27 LAB — CBG MONITORING, ED
GLUCOSE-CAPILLARY: 337 mg/dL — AB (ref 65–99)
Glucose-Capillary: 237 mg/dL — ABNORMAL HIGH (ref 65–99)

## 2015-05-27 LAB — CBC
HEMATOCRIT: 41.6 % (ref 39.0–52.0)
HEMOGLOBIN: 14.2 g/dL (ref 13.0–17.0)
MCH: 29 pg (ref 26.0–34.0)
MCHC: 34.1 g/dL (ref 30.0–36.0)
MCV: 85.1 fL (ref 78.0–100.0)
Platelets: 191 10*3/uL (ref 150–400)
RBC: 4.89 MIL/uL (ref 4.22–5.81)
RDW: 13.7 % (ref 11.5–15.5)
WBC: 7.9 10*3/uL (ref 4.0–10.5)

## 2015-05-27 LAB — ETHANOL: Alcohol, Ethyl (B): <5 mg/dL (ref <5)

## 2015-05-27 LAB — SALICYLATE LEVEL

## 2015-05-27 LAB — ACETAMINOPHEN LEVEL: Acetaminophen (Tylenol), Serum: <10 ug/mL — ABNORMAL LOW (ref 10–30)

## 2015-05-27 MED ORDER — LORAZEPAM 1 MG PO TABS: 1.0000 mg | ORAL_TABLET | Freq: Three times a day (TID) | ORAL | Status: DC | PRN

## 2015-05-27 MED ORDER — ONDANSETRON HCL 4 MG PO TABS: 4.0000 mg | ORAL_TABLET | Freq: Three times a day (TID) | ORAL | Status: DC | PRN

## 2015-05-27 MED ORDER — ZOLPIDEM TARTRATE 5 MG PO TABS: 5.0000 mg | ORAL_TABLET | Freq: Every evening | ORAL | Status: DC | PRN

## 2015-05-27 MED ORDER — INSULIN GLARGINE 100 UNIT/ML ~~LOC~~ SOLN
10.0000 [IU] | Freq: Every day | SUBCUTANEOUS | Status: DC
  Administered 2015-05-27: 10 [IU] via SUBCUTANEOUS
  Filled 2015-05-27 (×2): qty 0.1

## 2015-05-27 MED ORDER — ACETAMINOPHEN 325 MG PO TABS: 650.0000 mg | ORAL_TABLET | ORAL | Status: DC | PRN

## 2015-05-27 MED ORDER — INSULIN ASPART PROT & ASPART (70-30 MIX) 100 UNIT/ML ~~LOC~~ SUSP
10.0000 [IU] | Freq: Every day | SUBCUTANEOUS | Status: DC
  Administered 2015-05-27: 10 [IU] via SUBCUTANEOUS
  Filled 2015-05-27: qty 10

## 2015-05-27 MED ORDER — IBUPROFEN 200 MG PO TABS: 600.0000 mg | ORAL_TABLET | Freq: Three times a day (TID) | ORAL | Status: DC | PRN

## 2015-05-27 MED ORDER — NICOTINE 21 MG/24HR TD PT24
21.0000 mg | MEDICATED_PATCH | Freq: Every day | TRANSDERMAL | Status: DC
  Filled 2015-05-27: qty 1

## 2015-05-27 MED ORDER — ALUM & MAG HYDROXIDE-SIMETH 200-200-20 MG/5ML PO SUSP: 30.0000 mL | ORAL | Status: DC | PRN

## 2015-05-27 MED ORDER — METFORMIN HCL 500 MG PO TABS
1000.0000 mg | ORAL_TABLET | Freq: Two times a day (BID) | ORAL | Status: DC
  Administered 2015-05-27 – 2015-05-28 (×2): 1000 mg via ORAL
  Filled 2015-05-27 (×4): qty 2

## 2015-05-27 NOTE — ED Notes (Signed)
2 bags of belongings were placed in locker 37.

## 2015-05-27 NOTE — ED Notes (Signed)
Pt states that he has been using crack cocaine for 20-25 years; pt states "I am missing out on so much of my life and I just can't do it anymore. If I leave this hospital I just don't know what I will do to myself and that that scares me"; pt states that he is suicidal and has a plan to overdose or jump out in traffic; pt tearful in triage

## 2015-05-27 NOTE — ED Notes (Signed)
Pt oriented to room and unit.  Denies S/I at this time, denies H/I and AVH.  15 minute checks and video monitoring continue.

## 2015-05-27 NOTE — ED Notes (Signed)
Bed: [facility] Expected date:  Expected time:  Means of arrival:  Comments: Triage 6

## 2015-05-27 NOTE — BH Assessment (Signed)
Assessment Note  James [name] is an 55 y.o.  male. Patient is married and homeless. He is presenting to [facility] as a self referral. Patient has suicidal ideations. Patient sts, "I don't know" when asked if he has a suicidal plan. He does have access to means, sharp objects. Patient had a prior suicide attempt in which he tried to cut himself. He does not know the prior trigger for his suicide attempt. He also does not recall the date. Patient sts that he is unable to contract for safety today. His stressors include: addiction from cocaine, family conflict, homelessness, and lack of support. He denies HI and AVH's. Patient started using crack cocaine at the age of 61. He uses "every other day". He uses 1/2 gram to 1 gram. Patient's last use was yesterday morning. Patient has received inpatient mental health and substance abuse treatment in the past but he is unable to recall location or dates. He does not have a current outpatient mental health provider.   Diagnosis: Substance Abuse and Depressive Disorder  Past Medical History:  Past Medical History  Diagnosis Date  . Diabetes mellitus without complication [facility])     Past Surgical History  Procedure Laterality Date  . Finger surgery      Family History: No family history on file.  Social History:  reports that he has been smoking Cigarettes.  He has a 11.5 pack-year smoking history. He has never used smokeless tobacco. He reports that he drinks about 37.2 oz of alcohol per week. He reports that he uses illicit drugs (Cocaine and "Crack" cocaine) about 3 times per week.  Additional Social History:  Alcohol / Drug Use Pain Medications: SEE MAR Prescriptions: SEE MAR Over the Counter: SEE MAR History of alcohol / drug use?: No history of alcohol / drug abuse Substance #1 Name of Substance 1: Crack Cocaine  1 - Age of First Use: 55 yrs old  1 - Amount (size/oz): 1/2 gram or 1 gram 1 - Frequency: "Every other day" 1 - Duration: "Every other  day since the age of 30" 1 - Last Use / Amount: 05/26/2015  CIWA: CIWA-Ar BP: 135/78 mmHg Pulse Rate: 102 COWS:    Allergies: No Known Allergies  Home Medications:  (Not in a hospital admission)  OB/GYN Status:  No LMP for male patient.  General Assessment Data Location of Assessment: [facility] TTS Assessment: In system Is this a Tele or Face-to-Face Assessment?: Face-to-Face Is this an Initial Assessment or a Re-assessment for this encounter?: Initial Assessment Marital status: Married [place] name:  (n/a) Is patient pregnant?: No Pregnancy Status: No Living Arrangements: Other (Comment) (homeless) Can pt return to current living arrangement?: No Admission Status: Voluntary Is patient capable of signing voluntary admission?: No Referral Source: Self/Family/Friend Insurance type:  (no insurance )  Medical Screening Exam ([facility] Walk-in ONLY) Medical Exam completed:  (n/a)  Crisis Care Plan Living Arrangements: Other (Comment) (homeless) Legal Guardian:  (no guardian ) Name of Psychiatrist:  (No psychiatrist ) Name of Therapist:  (No therapist )  Education Status Is patient currently in school?: No Current Grade:  (n/a) Highest grade of school patient has completed:  (n/a) Name of school:  (n/a) Contact person:  (n/a)  Risk to self with the past 6 months Suicidal Ideation: Yes-Currently Present Has patient been a risk to self within the past 6 months prior to admission? : Yes Suicidal Intent: Yes-Currently Present Has patient had any suicidal intent within the past 6 months prior to admission? :  Yes Is patient at risk for suicide?: Yes Suicidal Plan?: Yes-Currently Present Has patient had any suicidal plan within the past 6 months prior to admission? : Yes (Patient sts, "I don't know") Specify Current Suicidal Plan:  (Pt sts, "I don't know") Access to Means: No What has been your use of drugs/alcohol within the last 12 months?:  (n/a) Previous Attempts/Gestures:  Yes How many times?:  (Patient sts, "I don't know"; but acknowledges history ) Other Self Harm Risks:  (none reported) Triggers for Past Attempts:  (none reported) Intentional Self Injurious Behavior: None Family Suicide History: No Recent stressful life event(s): Other (Comment) ("I can't stop using crack cocaine"; addiction) Persecutory voices/beliefs?: No Depression: Yes Depression Symptoms: Feeling angry/irritable, Feeling worthless/self pity, Fatigue, Guilt, Loss of interest in usual pleasures, Tearfulness, Isolating, Insomnia, Despondent Substance abuse history and/or treatment for substance abuse?: No Suicide prevention information given to non-admitted patients: Not applicable  Risk to Others within the past 6 months Homicidal Ideation: No Does patient have any lifetime risk of violence toward others beyond the six months prior to admission? : No Thoughts of Harm to Others: No Current Homicidal Intent: No Current Homicidal Plan: No Access to Homicidal Means: No Identified Victim:  (n/a) History of harm to others?: No Assessment of Violence: None Noted Violent Behavior Description:  (patient is calm and cooperative ) Does patient have access to weapons?: No Criminal Charges Pending?: No Does patient have a court date: No Is patient on probation?: No  Psychosis Hallucinations: None noted Delusions: None noted  Mental Status Report Appearance/Hygiene: Disheveled Eye Contact: Good Motor Activity: Freedom of movement Speech: Logical/coherent Level of Consciousness: Alert Mood: Depressed Affect: Appropriate to circumstance Anxiety Level: None Thought Processes: Relevant, Coherent Judgement: Impaired Orientation: Person, Place, Time, Situation Obsessive Compulsive Thoughts/Behaviors: None  Cognitive Functioning Concentration: Decreased Memory: Recent Intact, Remote Intact IQ: Average Insight: Poor Impulse Control: Good Appetite: Poor Weight Loss:  (none  reported) Weight Gain:  (none reported) Sleep: Decreased Total Hours of Sleep:  (varies-3 to 4 hours )  ADLScreening [facility] Assessment Services) Patient's cognitive ability adequate to safely complete daily activities?: Yes Patient able to express need for assistance with ADLs?: Yes Independently performs ADLs?: Yes (appropriate for developmental age)  Prior Inpatient Therapy Prior Inpatient Therapy: No Prior Therapy Dates:  (n/a) Prior Therapy Facilty/Provider(s):  (n/a) Reason for Treatment:  (n/a)  Prior Outpatient Therapy Prior Outpatient Therapy: No Prior Therapy Dates:  (n/a) Prior Therapy Facilty/Provider(s):  (n/a) Reason for Treatment:  (n/a) Does patient have an ACCT team?: No Does patient have Intensive In-House Services?  : No Does patient have [facility] services? : No Does patient have P4CC services?: No  ADL Screening (condition at time of admission) Patient's cognitive ability adequate to safely complete daily activities?: Yes Is the patient deaf or have difficulty hearing?: No Does the patient have difficulty seeing, even when wearing glasses/contacts?: No Does the patient have difficulty concentrating, remembering, or making decisions?: No Patient able to express need for assistance with ADLs?: Yes Does the patient have difficulty dressing or bathing?: No Independently performs ADLs?: Yes (appropriate for developmental age) Does the patient have difficulty walking or climbing stairs?: No Weakness of Legs: None Weakness of Arms/Hands: None  Home Assistive Devices/Equipment Home Assistive Devices/Equipment: None    Abuse/Neglect Assessment (Assessment to be complete while patient is alone) Physical Abuse: Denies Verbal Abuse: Denies Sexual Abuse: Denies Exploitation of patient/patient's resources: Denies Self-Neglect: Denies Values / Beliefs Cultural Requests During Hospitalization: None Spiritual Requests During Hospitalization: None  Advance  Directives (For Healthcare) Does patient have an advance directive?: No Would patient like information on creating an advanced directive?: No - patient declined information    Additional Information 1:1 In Past 12 Months?: No CIRT Risk: No Elopement Risk: No Does patient have medical clearance?: Yes     Disposition:  Disposition Initial Assessment Completed for this Encounter: Yes Disposition of Patient: Inpatient treatment program (Per Dr. Akintayo and Julieanne Cotton, NP,.Marland KitchenMarland KitchenIJannifer Franklinent recommended)  On Site Evaluation by:   Reviewed with Physician:    Melynda Ripple [facility] 05/27/2015 12:19 PM

## 2015-05-27 NOTE — ED Notes (Signed)
Pt has been seen and wanded by security.  Pt has 2 bags. 

## 2015-05-27 NOTE — Progress Notes (Addendum)
CM spoke with pt who confirms uninsured [employer] resident with no pcp.  CM discussed and provided written information for uninsured accepting pcps, discussed the importance of pcp vs EDP services for f/u care, www.needymeds.org, www.goodrx.com, discounted pharmacies and other [employer] such as [facility] , [employer], affordable care act, financial assistance, uninsured dental services, [place] med assist, DSS and  health department  Reviewed resources for [employer] uninsured accepting pcps like [facility], family medicine at [facility] [employer] [facility], [facility], [facility], local urgent care centers, [facility], [facility] family practice, general medical clinics, family services of the [place], [facility] urgent care plus others, medication resources, [facility] out patient pharmacies and housing Pt voiced understanding and appreciation of resources provided   Provided [facility] contact information Pt agreed to a referral Cm completed referral Pt to be contact by [facility] clinical liason  Previously had an orange card per pt from [facility] Discussed with pt that he would have to have an application completed and to meet with a [facility] staff to get one qualified for a card when he asked if he could get one on "thursday?"

## 2015-05-27 NOTE — ED Provider Notes (Signed)
CSN: [id number]     Arrival date & time 05/27/15  0530 History   First MD Initiated Contact with Patient 05/27/15 [id number]     Chief Complaint  Patient presents with  . Suicidal     (Consider location/radiation/quality/duration/timing/severity/associated sxs/prior Treatment) HPI   Pt with hx DM, long term crack cocaine abuse p/w suicidal ideation with plan to run into traffic.  States "I'm tired of living like this"  Has been using crack again x 7 months.  Denies ETOH abuse.  Denies HI, hallucinations.    Past Medical History  Diagnosis Date  . Diabetes mellitus without complication [facility])    Past Surgical History  Procedure Laterality Date  . Finger surgery     No family history on file. Social History  Substance Use Topics  . Smoking status: Current Every Day Smoker -- 0.50 packs/day for 23 years    Types: Cigarettes  . Smokeless tobacco: Never Used  . Alcohol Use: 37.2 oz/week    12 Cans of beer, 50 Shots of liquor per week     Comment: 2-40 oz daily    Review of Systems  All other systems reviewed and are negative.     Allergies  Review of patient's allergies indicates no known allergies.  Home Medications   Prior to Admission medications   Medication Sig Start Date End Date Taking? Authorizing Provider  buPROPion (WELLBUTRIN XL) 150 MG 24 hr tablet Take 1 tablet (150 mg total) by mouth daily. For depression 12/11/12   Sanjuana Kava, NP  insulin aspart protamine- aspart (NOVOLOG MIX 70/30) (70-30) 100 UNIT/ML injection Inject 0.1 mLs (10 Units total) into the skin daily with supper. For diabetes control 12/11/12   Sanjuana Kava, NP  insulin glargine (LANTUS) 100 UNIT/ML injection Inject 0.1 mLs (10 Units total) into the skin at bedtime. For diabetes control 12/11/12   Sanjuana Kava, NP  meloxicam (MOBIC) 15 MG tablet Take 1 tablet (15 mg total) by mouth daily. 12/30/13   Graylon Good, PA-C  metFORMIN (GLUCOPHAGE) 1000 MG tablet Take 1 tablet (1,000 mg total) by mouth 2  (two) times daily with a meal. For diabetes management 12/11/12   Sanjuana Kava, NP  traZODone (DESYREL) 50 MG tablet Take 1 tablet (50 mg total) by mouth at bedtime and may repeat dose one time if needed. For sleep 12/11/12   Sanjuana Kava, NP   BP 135/78 mmHg  Pulse 102  Temp(Src) 98 F (36.7 C) (Oral)  Resp 18  Ht 6' (1.829 m)  Wt 62.823 kg  BMI 18.78 kg/m2  SpO2 98% Physical Exam  Constitutional: He appears well-developed and well-nourished. No distress.  HENT:  Head: Normocephalic and atraumatic.  Neck: Neck supple.  Cardiovascular: Normal rate and regular rhythm.   Pulmonary/Chest: Effort normal and breath sounds normal. No respiratory distress. He has no wheezes. He has no rales.  Abdominal: Soft. He exhibits no distension. There is no rebound and no guarding.  No focal tenderness  Neurological: He is alert. He exhibits normal muscle tone. GCS eye subscore is 4. GCS verbal subscore is 5. GCS motor subscore is 6.  Skin: He is not diaphoretic.  Psychiatric: He exhibits a depressed mood. He expresses suicidal ideation. He expresses no homicidal ideation. He expresses suicidal plans.  Nursing note and vitals reviewed.   ED Course  Procedures (including critical care time) Labs Review Labs Reviewed  COMPREHENSIVE METABOLIC PANEL - Abnormal; Notable for the following:    Sodium 132 (*)  Chloride 97 (*)    Glucose, Bld 331 (*)    All other components within normal limits  ACETAMINOPHEN LEVEL - Abnormal; Notable for the following:    Acetaminophen (Tylenol), Serum <10 (*)    All other components within normal limits  URINE RAPID DRUG SCREEN, HOSP PERFORMED - Abnormal; Notable for the following:    Cocaine POSITIVE (*)    All other components within normal limits  ETHANOL  SALICYLATE LEVEL  CBC    Imaging Review No results found. I have personally reviewed and evaluated these images and lab results as part of my medical decision-making.   EKG Interpretation None       MDM   Final diagnoses:  Suicidal ideation  Crack cocaine use   Voluntary patient requesting help with suicidal ideation with plan.  Long term crack cocaine use.  Medically cleared.  Hyperglycemic, no DKA.  Home medications ordered.   Awaiting TTS consultation and placement.     Trixie Dredge, PA-C 05/27/15 1028  Derwood Kaplan, MD 05/28/15 [phone]

## 2015-05-27 NOTE — BH Assessment (Signed)
[facility] Assessment Progress Note  The following facilities have been contacted to seek placement for this pt, with results as noted:  Beds available, information sent, decision pending:  [facility]   At capacity:  [facility] Delice Lesch [facility] [place] [facility] [place], [place] Triage Specialist [phone]

## 2015-05-27 NOTE — ED Notes (Signed)
Pt sleeping at present, easily arouseable to verbal stimuli.  No distress noted,calm & cooperative.  Monitoring for safety, Q 15 min checks in effect. 

## 2015-05-28 ENCOUNTER — Inpatient Hospital Stay (HOSPITAL_COMMUNITY)
Admission: AD | Admit: 2015-05-28 | Discharge: 2015-05-30 | DRG: 885 | Disposition: A | Discharge status: Home / Self Care | Payer: Federal, State, Local not specified - Other | Source: Intra-hospital | Attending: Psychiatry | Admitting: Psychiatry

## 2015-05-28 ENCOUNTER — Encounter (HOSPITAL_COMMUNITY): Payer: Self-pay | Admitting: *Deleted

## 2015-05-28 DIAGNOSIS — F339 Major depressive disorder, recurrent, unspecified: Principal | ICD-10-CM | POA: Diagnosis present

## 2015-05-28 DIAGNOSIS — F19251 Other psychoactive substance dependence with psychoactive substance-induced psychotic disorder with hallucinations: Secondary | ICD-10-CM | POA: Diagnosis not present

## 2015-05-28 DIAGNOSIS — R45851 Suicidal ideations: Secondary | ICD-10-CM

## 2015-05-28 DIAGNOSIS — Z915 Personal history of self-harm: Secondary | ICD-10-CM | POA: Diagnosis not present

## 2015-05-28 DIAGNOSIS — F17210 Nicotine dependence, cigarettes, uncomplicated: Secondary | ICD-10-CM | POA: Diagnosis present

## 2015-05-28 DIAGNOSIS — F19929 Other psychoactive substance use, unspecified with intoxication, unspecified: Secondary | ICD-10-CM | POA: Diagnosis present

## 2015-05-28 DIAGNOSIS — F1490 Cocaine use, unspecified, uncomplicated: Secondary | ICD-10-CM | POA: Diagnosis present

## 2015-05-28 DIAGNOSIS — E119 Type 2 diabetes mellitus without complications: Secondary | ICD-10-CM | POA: Diagnosis present

## 2015-05-28 DIAGNOSIS — Z590 Homelessness: Secondary | ICD-10-CM | POA: Diagnosis not present

## 2015-05-28 DIAGNOSIS — F14251 Cocaine dependence with cocaine-induced psychotic disorder with hallucinations: Secondary | ICD-10-CM | POA: Insufficient documentation

## 2015-05-28 DIAGNOSIS — F329 Major depressive disorder, single episode, unspecified: Secondary | ICD-10-CM | POA: Diagnosis present

## 2015-05-28 DIAGNOSIS — F1424 Cocaine dependence with cocaine-induced mood disorder: Secondary | ICD-10-CM

## 2015-05-28 DIAGNOSIS — F1420 Cocaine dependence, uncomplicated: Secondary | ICD-10-CM | POA: Diagnosis present

## 2015-05-28 DIAGNOSIS — F19951 Other psychoactive substance use, unspecified with psychoactive substance-induced psychotic disorder with hallucinations: Secondary | ICD-10-CM

## 2015-05-28 LAB — GLUCOSE, CAPILLARY
GLUCOSE-CAPILLARY: 102 mg/dL — AB (ref 65–99)
GLUCOSE-CAPILLARY: 213 mg/dL — AB (ref 65–99)
Glucose-Capillary: 473 mg/dL — ABNORMAL HIGH (ref 65–99)
Glucose-Capillary: 447 mg/dL — ABNORMAL HIGH (ref 65–99)
Glucose-Capillary: 210 mg/dL — ABNORMAL HIGH (ref 65–99)

## 2015-05-28 MED ORDER — ACETAMINOPHEN 325 MG PO TABS: 650.0000 mg | ORAL_TABLET | Freq: Four times a day (QID) | ORAL | Status: DC | PRN

## 2015-05-28 MED ORDER — MAGNESIUM HYDROXIDE 400 MG/5ML PO SUSP: 30.0000 mL | Freq: Every day | ORAL | Status: DC | PRN

## 2015-05-28 MED ORDER — INSULIN ASPART 100 UNIT/ML ~~LOC~~ SOLN
0.0000 [IU] | Freq: Three times a day (TID) | SUBCUTANEOUS | Status: DC
  Administered 2015-05-28: 9 [IU] via SUBCUTANEOUS
  Administered 2015-05-29: 3 [IU] via SUBCUTANEOUS
  Administered 2015-05-29: 7 [IU] via SUBCUTANEOUS
  Administered 2015-05-29 – 2015-05-30 (×2): 2 [IU] via SUBCUTANEOUS
  Administered 2015-05-30: 1 [IU] via SUBCUTANEOUS

## 2015-05-28 MED ORDER — TRAZODONE HCL 50 MG PO TABS
50.0000 mg | ORAL_TABLET | Freq: Every evening | ORAL | Status: DC | PRN
  Administered 2015-05-29: 50 mg via ORAL
  Filled 2015-05-28: qty 14
  Filled 2015-05-28: qty 1
  Filled 2015-05-28: qty 14
  Filled 2015-05-28 (×5): qty 1

## 2015-05-28 MED ORDER — MELOXICAM 7.5 MG PO TABS
15.0000 mg | ORAL_TABLET | Freq: Every day | ORAL | Status: DC
  Administered 2015-05-28 – 2015-05-30 (×3): 15 mg via ORAL
  Filled 2015-05-28: qty 14
  Filled 2015-05-28: qty 2
  Filled 2015-05-28 (×3): qty 1
  Filled 2015-05-28: qty 2
  Filled 2015-05-28: qty 1

## 2015-05-28 MED ORDER — INSULIN ASPART PROT & ASPART (70-30 MIX) 100 UNIT/ML ~~LOC~~ SUSP
10.0000 [IU] | Freq: Every day | SUBCUTANEOUS | Status: DC
  Administered 2015-05-28 – 2015-05-29 (×2): 10 [IU] via SUBCUTANEOUS

## 2015-05-28 MED ORDER — HYDROXYZINE HCL 25 MG PO TABS
25.0000 mg | ORAL_TABLET | Freq: Four times a day (QID) | ORAL | Status: DC | PRN
  Filled 2015-05-28: qty 10

## 2015-05-28 MED ORDER — NICOTINE 21 MG/24HR TD PT24
21.0000 mg | MEDICATED_PATCH | Freq: Every day | TRANSDERMAL | Status: DC
  Administered 2015-05-29: 21 mg via TRANSDERMAL
  Filled 2015-05-28 (×5): qty 1

## 2015-05-28 MED ORDER — ALUM & MAG HYDROXIDE-SIMETH 200-200-20 MG/5ML PO SUSP: 30.0000 mL | ORAL | Status: DC | PRN

## 2015-05-28 MED ORDER — TRAZODONE HCL 50 MG PO TABS: 50.0000 mg | ORAL_TABLET | Freq: Every day | ORAL | Status: DC

## 2015-05-28 MED ORDER — BUPROPION HCL ER (XL) 150 MG PO TB24
150.0000 mg | ORAL_TABLET | Freq: Every day | ORAL | Status: DC
  Administered 2015-05-28: 150 mg via ORAL
  Filled 2015-05-28 (×2): qty 1

## 2015-05-28 MED ORDER — METFORMIN HCL 500 MG PO TABS
1000.0000 mg | ORAL_TABLET | Freq: Two times a day (BID) | ORAL | Status: DC
  Administered 2015-05-28 – 2015-05-30 (×4): 1000 mg via ORAL
  Filled 2015-05-28 (×6): qty 2
  Filled 2015-05-28: qty 28
  Filled 2015-05-28: qty 2
  Filled 2015-05-28: qty 28
  Filled 2015-05-28: qty 2

## 2015-05-28 MED ORDER — INSULIN GLARGINE 100 UNIT/ML ~~LOC~~ SOLN
10.0000 [IU] | Freq: Every day | SUBCUTANEOUS | Status: DC
  Administered 2015-05-28 – 2015-05-29 (×2): 10 [IU] via SUBCUTANEOUS

## 2015-05-28 NOTE — ED Notes (Signed)
Pt has been in bed all morning. He becomes A&O when his name is called. He ate breakfast and took his medication.

## 2015-05-28 NOTE — Progress Notes (Signed)
Pt attended evening NA group.  

## 2015-05-28 NOTE — ED Notes (Signed)
Pt discharged [facility] voluntarily. He was transported by [facility]. NAD.

## 2015-05-28 NOTE — Consult Note (Signed)
[facility] Consult   Reason for Consult:  Depression, Cocaine abuse Referring Physician:  EDP Patient Identification: James [name] MRN:  [id number] Principal Diagnosis: Cocaine dependence ([place]) Diagnosis:   Patient Active Problem List   Diagnosis Date Noted  . Cocaine dependence ([place]) [F14.20] 03/01/2012    Priority: High  . Alcohol withdrawal ([place]) [F10.239] 12/07/2012  . Alcohol abuse [F10.10] 03/01/2012  . Major depression ([place]) [F32.9] 03/01/2012  . 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 MUSCLE SPASM, BACK [M53.80] 02/12/2009  . 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 HYPERCHOLESTEROLEMIA [E78.00] 05/09/2007  . SINUSITIS, CHRONIC [J32.9] 05/01/2007  . ALLERGIC RHINITIS [J30.9] 12/21/2006  . DIABETES MELLITUS, TYPE II [E11.9] 12/07/2006  . HYPERTENSION [I10] 12/07/2006    Total Time spent with patient: 45 minutes  Subjective:   James [name] is a 55 y.o. male patient admitted with Depression, Cocaine abuse, Suicidal Ideation.Marland Kitchen  HPI:  AA male, 55 years old was evaluated for suicidal ideation as a result of recent relapse on Cocaine.   Patient states he feels ashamed, angry and suicidal for his relapse after staying clean for a very long time.  Patient denies a hx of Depression but is taking Wellbutrin.  Patient states that he has battled Cocaine use for a long time.  He denies any other drug use or Alcohol.  He has been detoxed in the past and was diagnosed with depression at the time in 2014 at [facility].   Patient reports poor sleep and appetite.  He has been accepted for admission and we will be seeking placement at any facility with available bed.  Past Psychiatric History:  Depression Recurrent, Cocaine Dependent  Risk to Self: Suicidal Ideation: Yes-Currently Present Suicidal Intent: Yes-Currently Present Is patient at risk for suicide?: Yes Suicidal Plan?: Yes-Currently Present Specify  Current Suicidal Plan:  (Pt sts, "I don't know") Access to Means: No What has been your use of drugs/alcohol within the last 12 months?:  (n/a) How many times?:  (Patient sts, "I don't know"; but acknowledges history ) Other Self Harm Risks:  (none reported) Triggers for Past Attempts:  (none reported) Intentional Self Injurious Behavior: None Risk to Others: Homicidal Ideation: No Thoughts of Harm to Others: No Current Homicidal Intent: No Current Homicidal Plan: No Access to Homicidal Means: No Identified Victim:  (n/a) History of harm to others?: No Assessment of Violence: None Noted Violent Behavior Description:  (patient is calm and cooperative ) Does patient have access to weapons?: No Criminal Charges Pending?: No Does patient have a court date: No Prior Inpatient Therapy: Prior Inpatient Therapy: No Prior Therapy Dates:  (n/a) Prior Therapy Facilty/Provider(s):  (n/a) Reason for Treatment:  (n/a) Prior Outpatient Therapy: Prior Outpatient Therapy: No Prior Therapy Dates:  (n/a) Prior Therapy Facilty/Provider(s):  (n/a) Reason for Treatment:  (n/a) Does patient have an ACCT team?: No Does patient have Intensive In-House Services?  : No Does patient have [facility] services? : No Does patient have P4CC services?: No  Past Medical History:  Past Medical History  Diagnosis Date  . Diabetes mellitus without complication [facility])     Past Surgical History  Procedure Laterality Date  . Finger surgery     Family History: No family history on file.   Family Psychiatric  History:  Denies Social History:  History  Alcohol Use  . 37.2 oz/week  . 12 Cans of beer, 50 Shots of liquor per week    Comment: 2-40 oz daily  History  Drug Use  . 3.00 per week  . Special: Cocaine, "Crack" cocaine    Social History   Social History  . Marital Status: Legally Separated    Spouse Name: N/A  . Number of Children: N/A  . Years of Education: N/A   Social History Main Topics   . Smoking status: Current Every Day Smoker -- 0.50 packs/day for 23 years    Types: Cigarettes  . Smokeless tobacco: Never Used  . Alcohol Use: 37.2 oz/week    12 Cans of beer, 50 Shots of liquor per week     Comment: 2-40 oz daily  . Drug Use: 3.00 per week    Special: Cocaine, "Crack" cocaine  . Sexual Activity: Not Currently   Other Topics Concern  . None   Social History Narrative   Additional Social History:    Allergies:  No Known Allergies  Labs:  Results for orders placed or performed during the hospital encounter of 05/27/15 (from the past 48 hour(s))  Comprehensive metabolic panel     Status: Abnormal   Collection Time: 05/27/15  6:46 AM  Result Value Ref Range   Sodium 132 (L) 135 - 145 mmol/L   Potassium 4.3 3.5 - 5.1 mmol/L   Chloride 97 (L) 101 - 111 mmol/L   CO2 22 22 - 32 mmol/L   Glucose, Bld 331 (H) 65 - 99 mg/dL   BUN 13 6 - 20 mg/dL   Creatinine, Ser 0.85 0.61 - 1.24 mg/dL   Calcium 9.3 8.9 - 10.3 mg/dL   Total Protein 7.0 6.5 - 8.1 g/dL   Albumin 4.1 3.5 - 5.0 g/dL   AST 25 15 - 41 U/L   ALT 36 17 - 63 U/L   Alkaline Phosphatase 66 38 - 126 U/L   Total Bilirubin 1.0 0.3 - 1.2 mg/dL   GFR calc non Af Amer >60 >60 mL/min   GFR calc Af Amer >60 >60 mL/min    Comment: (NOTE) The eGFR has been calculated using the CKD EPI equation. This calculation has not been validated in all clinical situations. eGFR's persistently <60 mL/min signify possible Chronic Kidney Disease.    Anion gap 13 5 - 15  Ethanol (ETOH)     Status: None   Collection Time: 05/27/15  6:46 AM  Result Value Ref Range   Alcohol, Ethyl (B) <5 <5 mg/dL    Comment:        LOWEST DETECTABLE LIMIT FOR SERUM ALCOHOL IS 5 mg/dL FOR MEDICAL PURPOSES ONLY   Salicylate level     Status: None   Collection Time: 05/27/15  6:46 AM  Result Value Ref Range   Salicylate Lvl <2.4 2.8 - 30.0 mg/dL  Acetaminophen level     Status: Abnormal   Collection Time: 05/27/15  6:46 AM  Result Value  Ref Range   Acetaminophen (Tylenol), Serum <10 (L) 10 - 30 ug/mL    Comment:        THERAPEUTIC CONCENTRATIONS VARY SIGNIFICANTLY. A RANGE OF 10-30 ug/mL MAY BE AN EFFECTIVE CONCENTRATION FOR MANY PATIENTS. HOWEVER, SOME ARE BEST TREATED AT CONCENTRATIONS OUTSIDE THIS RANGE. ACETAMINOPHEN CONCENTRATIONS >150 ug/mL AT 4 HOURS AFTER INGESTION AND >50 ug/mL AT 12 HOURS AFTER INGESTION ARE OFTEN ASSOCIATED WITH TOXIC REACTIONS.   CBC     Status: None   Collection Time: 05/27/15  6:46 AM  Result Value Ref Range   WBC 7.9 4.0 - 10.5 K/uL   RBC 4.89 4.22 - 5.81 MIL/uL   Hemoglobin  14.2 13.0 - 17.0 g/dL   HCT 41.6 39.0 - 52.0 %   MCV 85.1 78.0 - 100.0 fL   MCH 29.0 26.0 - 34.0 pg   MCHC 34.1 30.0 - 36.0 g/dL   RDW 13.7 11.5 - 15.5 %   Platelets 191 150 - 400 K/uL  Urine rapid drug screen (hosp performed) (Not at [facility])     Status: Abnormal   Collection Time: 05/27/15  8:44 AM  Result Value Ref Range   Opiates NONE DETECTED NONE DETECTED   Cocaine POSITIVE (A) NONE DETECTED   Benzodiazepines NONE DETECTED NONE DETECTED   Amphetamines NONE DETECTED NONE DETECTED   Tetrahydrocannabinol NONE DETECTED NONE DETECTED   Barbiturates NONE DETECTED NONE DETECTED    Comment:        DRUG SCREEN FOR MEDICAL PURPOSES ONLY.  IF CONFIRMATION IS NEEDED FOR ANY PURPOSE, NOTIFY LAB WITHIN 5 DAYS.        LOWEST DETECTABLE LIMITS FOR URINE DRUG SCREEN Drug Class       Cutoff (ng/mL) Amphetamine      1000 Barbiturate      200 Benzodiazepine   993 Tricyclics       570 Opiates          300 Cocaine          300 THC              50   CBG monitoring, ED     Status: Abnormal   Collection Time: 05/27/15  5:16 PM  Result Value Ref Range   Glucose-Capillary 337 (H) 65 - 99 mg/dL  CBG monitoring, ED     Status: Abnormal   Collection Time: 05/27/15  9:13 PM  Result Value Ref Range   Glucose-Capillary 237 (H) 65 - 99 mg/dL    Current Facility-Administered Medications  Medication Dose Route  Frequency Provider Last Rate Last Dose  . acetaminophen (TYLENOL) tablet 650 mg  650 mg Oral Q4H PRN Clayton Bibles, PA-C      . alum & mag hydroxide-simeth (MAALOX/MYLANTA) 200-200-20 MG/5ML suspension 30 mL  30 mL Oral PRN Clayton Bibles, PA-C      . buPROPion (WELLBUTRIN XL) 24 hr tablet 150 mg  150 mg Oral Daily Annel Zunker, MD      . ibuprofen (ADVIL,MOTRIN) tablet 600 mg  600 mg Oral Q8H PRN Clayton Bibles, PA-C      . insulin aspart protamine- aspart (NOVOLOG MIX 70/30) injection 10 Units  10 Units Subcutaneous Q supper [place], PA-C   10 Units at 05/27/15 1827  . insulin glargine (LANTUS) injection 10 Units  10 Units Subcutaneous QHS Clayton Bibles, PA-C   10 Units at 05/27/15 2151  . LORazepam (ATIVAN) tablet 1 mg  1 mg Oral Q8H PRN Clayton Bibles, PA-C      . metFORMIN (GLUCOPHAGE) tablet 1,000 mg  1,000 mg Oral BID WC Clayton Bibles, PA-C   1,000 mg at 05/28/15 0839  . nicotine (NICODERM CQ - dosed in mg/24 hours) patch 21 mg  21 mg Transdermal Daily Emily West, PA-C   21 mg at 05/27/15 1848  . ondansetron (ZOFRAN) tablet 4 mg  4 mg Oral Q8H PRN Clayton Bibles, PA-C      . zolpidem (AMBIEN) tablet 5 mg  5 mg Oral QHS PRN Clayton Bibles, PA-C       Current Outpatient Prescriptions  Medication Sig Dispense Refill  . insulin aspart protamine- aspart (NOVOLOG MIX 70/30) (70-30) 100 UNIT/ML injection Inject 0.1 mLs (10 Units total)  into the skin daily with supper. For diabetes control 10 mL 12  . insulin glargine (LANTUS) 100 UNIT/ML injection Inject 0.1 mLs (10 Units total) into the skin at bedtime. For diabetes control 10 mL 12  . SITagliptin-MetFORMIN HCl (JANUMET PO) Take 1 tablet by mouth 2 (two) times daily.    Marland Kitchen buPROPion (WELLBUTRIN XL) 150 MG 24 hr tablet Take 1 tablet (150 mg total) by mouth daily. For depression (Patient not taking: Reported on 05/27/2015) 30 tablet 0  . meloxicam (MOBIC) 15 MG tablet Take 1 tablet (15 mg total) by mouth daily. (Patient not taking: Reported on 05/27/2015) 30 tablet 0  .  metFORMIN (GLUCOPHAGE) 1000 MG tablet Take 1 tablet (1,000 mg total) by mouth 2 (two) times daily with a meal. For diabetes management (Patient not taking: Reported on 05/27/2015) 60 tablet 0  . traZODone (DESYREL) 50 MG tablet Take 1 tablet (50 mg total) by mouth at bedtime and may repeat dose one time if needed. For sleep (Patient not taking: Reported on 05/27/2015) 60 tablet 0    Musculoskeletal: Strength & Muscle Tone: within normal limits Gait & Station: normal Patient leans: N/A  Psychiatric Specialty Exam: Review of Systems  Constitutional: Negative.   HENT: Negative.   Eyes: Negative.   Respiratory: Negative.   Cardiovascular: Negative.   Gastrointestinal: Negative.   Genitourinary: Negative.   Musculoskeletal: Negative.   Skin: Negative.   Neurological: Negative.   Endo/Heme/Allergies:       Hx of DM on medications.    Blood pressure 137/86, pulse 84, temperature 97.8 F (36.6 C), temperature source Oral, resp. rate 18, height 6' (1.829 m), weight 62.823 kg (138 lb 8 oz), SpO2 99 %.Body mass index is 18.78 kg/(m^2).  General Appearance: Casual  Eye Contact::  Good  Speech:  Clear and Coherent and Normal Rate  Volume:  Normal  Mood:  Depressed  Affect:  Congruent  Thought Process:  Coherent  Orientation:  Full (Time, Place, and Person)  Thought Content:  WDL  Suicidal Thoughts:  Yes.  without intent/plan  Homicidal Thoughts:  No  Memory:  Immediate;   Good Recent;   Good Remote;   Good  Judgement:  Fair  Insight:  Good  Psychomotor Activity:  Psychomotor Retardation  Concentration:  Good  Recall:  [place] of Knowledge:Good  Language: Good  Akathisia:  No  Handed:  Right  AIMS (if indicated):     Assets:  Desire for Improvement Financial Resources/Insurance Housing  ADL's:  Intact  Cognition: WNL  Sleep:      Treatment Plan Summary: Daily contact with patient to assess and evaluate symptoms and progress in treatment and Medication  management  Disposition:  Accepted for admission and we will be seeking placement at a facility with available beds.  We will start patient  Trazodone 50 mg po at bed time for sleep and resume the rest of his home medications.  Delfin Gant, NP   PMHNP-BC 05/28/2015 10:58 AM Patient seen face-to-face for psychiatric evaluation, chart reviewed and case discussed with the physician extender and developed treatment plan. Reviewed the information documented and agree with the treatment plan. Corena Pilgrim, MD

## 2015-05-28 NOTE — Progress Notes (Signed)
Patient ID: James [name], male   DOB: [birth year], 55 y.o.   MRN: [id number] NSG Admit Note: 55 yo male admitted to [facility] services on the adult unit 300 hallway for further evaluation and treatment of a possible mood disorder with self harm thoughts and substance abuse. Pt arrive Vol. with Ambulance person from [facility]. States that he feels helpless and hopeless about is situation and had voiced a plan to jump out in front of traffic to end his life. Says he is glad to be here and contracts for safety while in the hospital. Also says he continues to have trouble with issues surrounding his substance abuse including family conflict and his being homeless at this time.Pt has previous inpt admission here and is somewhat familiar with the unit and activities. Oriented to room and handbook given. No complaints of pain or problems at this time.

## 2015-05-28 NOTE — Tx Team (Signed)
Initial Interdisciplinary Treatment Plan   PATIENT STRESSORS: Financial difficulties Marital or family conflict Substance abuse   PATIENT STRENGTHS: General fund of knowledge Motivation for treatment/growth Physical Health   PROBLEM LIST: Problem List/Patient Goals Date to be addressed Date deferred Reason deferred Estimated date of resolution  Alt in mood-depressed 05/28/15     Risk for self harm 05/28/15     Substance abuse 05/28/15        "I want to find out why I can't stop doing drugs"                                  DISCHARGE CRITERIA:  Ability to meet basic life and health needs Adequate post-discharge living arrangements Improved stabilization in mood, thinking, and/or behavior Motivation to continue treatment in a less acute level of care Need for constant or close observation no longer present Reduction of life-threatening or endangering symptoms to within safe limits Verbal commitment to aftercare and medication compliance Withdrawal symptoms are absent or subacute and managed without 24-hour nursing intervention  PRELIMINARY DISCHARGE PLAN: Attend aftercare/continuing care group Attend 12-step recovery group Outpatient therapy  PATIENT/FAMIILY INVOLVEMENT: This treatment plan has been presented to and reviewed with the patient, [place] ART, and/or family member, .  The patient and family have been given the opportunity to ask questions and make suggestions.  Ottie Glazier 05/28/2015, 4:24 PM

## 2015-05-28 NOTE — BH Assessment (Signed)
BHH Assessment Progress Note  Per Thedore Mins, MD, this pt requires psychiatric hospitalization at this time.  Berneice Heinrich, RN, [facility] has assigned pt to [facility] Rm 306-2.  Pt has signed Voluntary Admission and Consent for Treatment, as well as Consent to Release Information to his wife, and signed forms have been faxed to [facility].  Pt's nurse, Diane, has been notified, and agrees to send original paperwork along with pt via Juel Burrow, and to call report to [phone].  Doylene Canning, MA Triage Specialist [phone]

## 2015-05-28 NOTE — Progress Notes (Addendum)
D: STAT CBG ordered with admission orders resulted at 473. Rechecked and was 447.  A:Given Metformin 1,000mg  , 10u of 70/30 and 9u of Novolog per Dr.Lugo. R: Pt reported no physical discomfort. On follow-up: CBG rechecked at 18:49  = 210.

## 2015-05-29 DIAGNOSIS — F19951 Other psychoactive substance use, unspecified with psychoactive substance-induced psychotic disorder with hallucinations: Secondary | ICD-10-CM

## 2015-05-29 DIAGNOSIS — F19929 Other psychoactive substance use, unspecified with intoxication, unspecified: Secondary | ICD-10-CM | POA: Diagnosis present

## 2015-05-29 DIAGNOSIS — F14251 Cocaine dependence with cocaine-induced psychotic disorder with hallucinations: Secondary | ICD-10-CM

## 2015-05-29 DIAGNOSIS — F19251 Other psychoactive substance dependence with psychoactive substance-induced psychotic disorder with hallucinations: Secondary | ICD-10-CM

## 2015-05-29 LAB — GLUCOSE, CAPILLARY
GLUCOSE-CAPILLARY: 198 mg/dL — AB (ref 65–99)
GLUCOSE-CAPILLARY: 95 mg/dL (ref 65–99)
Glucose-Capillary: 304 mg/dL — ABNORMAL HIGH (ref 65–99)
Glucose-Capillary: 204 mg/dL — ABNORMAL HIGH (ref 65–99)

## 2015-05-29 NOTE — BHH Counselor (Signed)
Adult Comprehensive Assessment  Patient ID: James [name], male DOB: 14-Nov-1960, 55 y.o. 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 Family Relationships: Patient's addiction has put a strain on his relationship with his wife and family Surveyor, quantity / Lack of resources (include bankruptcy): Financial stressors due to substance abuse Housing / Lack of housing: patient currenlty lives with his wife and adult daughter in [place]  Physical health (include injuries & life threatening diseases): diabetes which is unmanaged, hernia Substance abuse: using crack cocaine 2-3 times a week Bereavement / Loss: patient's father died approximately 5 years ago  Living/Environment/Situation:  Living Arrangements: Spouse/significant other Living conditions (as described by patient or guardian):patient currenlty lives with his wife and adult daughter in [place]  How long has patient lived in current situation?: 17 years What is atmosphere in current home: Comfortable;Loving;Supportive  Family History:  Marital status: Married Number of Years Married: 34 years  What types of issues is patient dealing with in the relationship?: Per patient, his drug use is the major struggle in his relationship Additional relationship information: none Does patient have children?: Yes How many children?: 3  How is patient's relationship with their children?: very close, however relationship now strainded due to his drug use  Childhood History:  By whom was/is the patient raised?: Both parents Description of patient's relationship with caregiver when they were a child: very loving, caring and supportive. Per patient, father kept the family life peaceful Patient's description of current relationship with people who raised him/her: patient's father died 5 years ago, mother  resides in [place] and has very limited contact with her Does patient have siblings?: Yes Number of Siblings: 5  Description of patient's current relationship with siblings: not as close as they reportedly hold grudges against patient for his past behaviors Did patient suffer any verbal/emotional/physical/sexual abuse as a child?: No Did patient suffer from severe childhood neglect?: No Has patient ever been sexually abused/assaulted/raped as an adolescent or adult?: No Was the patient ever a victim of a crime or a disaster?: No Witnessed domestic violence?: No Has patient been effected by domestic violence as an adult?: No  Education:  Highest grade of school patient has completed: 12 Currently a student?: No Name of school: NA Contact person: NA Learning disability?: No  Employment/Work Situation:  Employment situation: Employed Patient's job has been impacted by current illness: no What is the longest time patient has a held a job?: 5 years 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 Has patient ever been in the [employer]?: No Has patient ever served in combat?: No  Financial Resources:  Financial resources: Income from employment  Does patient have a representative payee or guardian?: No  Alcohol/Substance Abuse:  What has been your use of drugs/alcohol within the last 12 months?: using crack cocaine 2-3 times a week If attempted suicide, did drugs/alcohol play a role in this?: Yes Alcohol/Substance Abuse Treatment Hx: Past Tx, Inpatient If yes, describe treatment: [facility] 7 yrs ago, [facility] residential program in [place] several years ago, [facility] detoxes in 2014 Has alcohol/substance abuse ever caused legal problems?: Yes- marijuana and cocaine possession charges   Social Support System:  [employer] Support System: Fair Development worker, community Support System: Family Type of faith/religion: Ephriam Knuckles How does  patient's faith help to cope with current illness?: "I know God can heal me  through prayer"  Leisure/Recreation:  Leisure and Hobbies: fishing  Strengths/Needs:  What things does the patient do well?: "I am a decent person" In what areas does patient struggle / problems for patient: drug addiction, self-esteem and guilt  Discharge Plan:  Does patient have access to transportation?: Yes Will patient be returning to same living situation after discharge?: Yes Currently receiving community mental health services: No If no, would patient like referral for services when discharged?: Yes (What county?) ([place]) Does patient have financial barriers related to dischargedications?: Yes Patient description of barriers related to discharge medications: No health insurance  Summary/Recommendations:  Patient is a 55 year old male with a diagnosis of Substance Induced Mood Disorder as well as Alcohol & Stimulant Use Disorder. Pt presented to the hospital with substance abuse. Pt reports primary trigger(s) for admission was relationship issues and addiction. Patient will benefit from crisis stabilization, medication evaluation, group therapy and psycho education in addition to case management for discharge planning. At discharge, it is recommended that Pt remain compliant with established discharge plan and continued treatment.  Samuella Bruin, LCSW Clinical Social Worker [facility] [phone]

## 2015-05-29 NOTE — Progress Notes (Signed)
Pt reports he is here for alcohol detox.  He reports he also want to get off of crack cocaine.  He says his alcohol and drug use are causing him to miss out on time with his family.  He denies SI/HI/AVH.  He signed a 72 h request for discharge tonight at 2123 and said he wants to be out of here by Friday to attend his granddaughter's birthday party.  He says he is only having mild withdrawal symptoms at this time.  Pt has been pleasant and appropriate.  He makes his needs known to staff.  Support and encouragement offered.  Discharge plans are in process.  Safety maintained with q15 minute checks.

## 2015-05-29 NOTE — BHH Group Notes (Signed)
BHH LCSW Group Therapy 05/29/2015 1:15 PM Type of Therapy: Group Therapy Participation Level: Active  Participation Quality: Attentive, Sharing and Supportive  Affect: Appropriate  Cognitive: Alert and Oriented  Insight: Developing/Improving and Engaged  Engagement in Therapy: Developing/Improving and Engaged  Modes of Intervention: Activity, Clarification, Confrontation, Discussion, Education, Exploration, Limit-setting, Orientation, Problem-solving, Rapport Building, Dance movement psychotherapist, Socialization and Support  Summary of Progress/Problems: Patient was attentive and engaged with speaker from [employer]. Patient was attentive to speaker while they shared their story of dealing with mental health and overcoming it. Patient expressed interest in their programs and services and received information on their agency. Patient processed ways they can relate to the speaker.   Samuella Bruin, LCSW Clinical Social Worker [facility] [phone]

## 2015-05-29 NOTE — BHH Group Notes (Signed)
The focus of this group is to educate the patient on the purpose and policies of crisis stabilization and provide a format to answer questions about their admission.  The group details unit policies and expectations of patients while admitted.  Patient did not attend 0900 nurse education orientation group this morning.  Patient stayed in bed sleeping.    

## 2015-05-29 NOTE — BHH Suicide Risk Assessment (Signed)
BHH INPATIENT:  Family/Significant Other Suicide Prevention Education  Suicide Prevention Education:  Patient Refusal for Family/Significant Other Suicide Prevention Education: The patient James [name] has refused to provide written consent for family/significant other to be provided Family/Significant Other Suicide Prevention Education during admission and/or prior to discharge.  Physician notified. SPE reviewed with patient and brochure provided. Patient encouraged to return to hospital if having suicidal thoughts, patient verbalized his/her understanding and has no further questions at this time.   James [name], James [name] 05/29/2015, 2:16 PM

## 2015-05-29 NOTE — Progress Notes (Signed)
[facility] Group Notes:  (Nursing/MHT/Case Management/Adjunct)  Date:  05/29/2015  Time:2045 Type of Therapy:  wrap up group  Participation Level:  Active  Participation Quality:  Appropriate, Attentive, Sharing and Supportive  Affect:  Appropriate  Cognitive:  Appropriate  Insight:  Good  Engagement in Group:  Engaged  Modes of Intervention:  Clarification, Education and Support  Summary of Progress/Problems:  James [name] 05/29/2015, 10:02 PM

## 2015-05-29 NOTE — Progress Notes (Signed)
D: Patient is pleasant on approach.  He states his depression is at a 5; he denies any hopelessness or anxiety.  His goal today is to "stay clean and start doing things differently."  He states he is sleeping and eating well.  He continues to have "cravings."  He denies any SI/HI/AVH.  He presents with flat, blunted affect.   A: Continue to monitor medication management and MD orders.  Safety checks completed every 15 minutes per protocol. Offer support and encouragement as needed. R: Patient is receptive to staff; his behavior is appropriate.

## 2015-05-29 NOTE — H&P (Signed)
Psychiatric Admission Assessment Adult  Patient Identification: James [name] MRN:  [id number] Date of Evaluation:  05/29/2015 Chief Complaint:  MDD COCAINE USE DISORDER DEPENDENCE Principal Diagnosis: <principal problem not specified> Diagnosis:   Patient Active Problem List   Diagnosis Date Noted  . Cocaine dependence (HCC) [F14.20] 03/01/2012    Priority: High  . Alcohol abuse [F10.10] 03/01/2012    Priority: High  . Major depression (HCC) [F32.9] 03/01/2012    Priority: High  . MUSCLE SPASM, BACK [M53.80] 02/12/2009    Priority: Low  . Major depressive disorder, recurrent episode (HCC) [F33.9] 05/28/2015  . Suicidal ideation [R45.851]   . Alcohol withdrawal (HCC) [F10.239] 12/07/2012  . 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 HYPERCHOLESTEROLEMIA [E78.00] 05/09/2007  . SINUSITIS, CHRONIC [J32.9] 05/01/2007  . ALLERGIC RHINITIS [J30.9] 12/21/2006  . DIABETES MELLITUS, TYPE II [E11.9] 12/07/2006  . HYPERTENSION [I10] 12/07/2006   History of Present Illness:: 55 Y/O male who states that after he left here last time, he was abstinent for 17 months (going to meetings, hanging out with the right people) and he picked up July 2016. States he started working in [employer]. States that to get to the place worked he had to go by the places he used to use. States he feels that this was the trigger. [facility] [address]) states that his wife made reservations for [facility] during the summer. States he stop while on his way there and he was offered some cocaine. He did not made it to [facility]. Has bee using cocaine since then. 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 States he got to a point he was wanting to kill himself full of shame and quilt. States he was high. He came for help. Started using crack in hi late 40's. The initial assessment is as  follows: James [name] is an 55 y.o. male. Patient is married and homeless. He is presenting to [facility] as a self referral. Patient has suicidal ideations. Patient sts, "I don't know" when asked if he has a suicidal plan. He does have access to means, sharp objects. Patient had a prior suicide attempt in which he tried to cut himself. He does not know the prior trigger for his suicide attempt. He also does not recall the date. Patient sts that he is unable to contract for safety today. His stressors include: addiction from cocaine, family conflict, homelessness, and lack of support. He denies HI and AVH's. Patient started using crack cocaine at the age of 32. He uses "every other day". He uses 1/2 gram to 1 gram. Patient's last use was yesterday morning. Patient has received inpatient mental health and substance abuse treatment in the past but he is unable to recall location or dates. He does not have a current outpatient mental health provider.   Associated Signs/Symptoms: Depression Symptoms:  depressed mood, anhedonia, fatigue, (Hypo) Manic Symptoms:  Labiality of Mood, Anxiety Symptoms:  Excessive Worry, Panic Symptoms, Psychotic Symptoms:  Hallucinations: Auditory Paranoia, PTSD Symptoms: Negative Total Time spent with patient: 45 minutes  Past Psychiatric History:   Is the patient at risk to self? Yes.    Has the patient been a risk to self in the past 6 months? No.  Has the patient been a risk to self within the distant past? No.  Is the patient a risk to others? No.  Has the patient been a risk to others in the past 6 months? No.  Has the patient been a risk to others within the distant past? No.   Prior Inpatient Therapy:  [facility] (7 years ago) 120 days 5 years clean relapsed after his father died and had anniversary of his death went to [facility] Prior Outpatient Therapy:  Not currently  Alcohol Screening: 1. How often do you have a drink containing alcohol?: 2 to 4  times a month 2. How many drinks containing alcohol do you have on a typical day when you are drinking?: 3 or 4 3. How often do you have six or more drinks on one occasion?: Never Preliminary Score: 1 4. How often during the last year have you found that you were not able to stop drinking once you had started?: Monthly 5. How often during the last year have you failed to do what was normally expected from you becasue of drinking?: Monthly 6. How often during the last year have you needed a first drink in the morning to get yourself going after a heavy drinking session?: Never 7. How often during the last year have you had a feeling of guilt of remorse after drinking?: Weekly 8. How often during the last year have you been unable to remember what happened the night before because you had been drinking?: Less than monthly 9. Have you or someone else been injured as a result of your drinking?: Yes, during the last year 10. Has a relative or friend or a doctor or another health worker been concerned about your drinking or suggested you cut down?: Yes, during the last year Alcohol Use Disorder Identification Test Final Score (AUDIT): 19 Brief Intervention: Yes Substance Abuse History in the last 12 months:  Yes.   Consequences of Substance Abuse: Legal Consequences:  possesion before cocaine and now small amount of pot Previous Psychotropic Medications: Yes  Psychological Evaluations: No  Past Medical History:  Past Medical History  Diagnosis Date  . Diabetes mellitus without complication [facility])     Past Surgical History  Procedure Laterality Date  . Finger surgery     Family History: History reviewed. No pertinent family history. Family Psychiatric  History: Brother heroin addict in recovery  Tobacco Screening: ([phone])::1)@ Social History:  History  Alcohol Use  . 37.2 oz/week  . 12 Cans of beer, 50 Shots of liquor per week    Comment: 2-40 oz daily     History  Drug Use   . 3.00 per week  . 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 Has been working Investment banker, operational Social History:                           Allergies:  No Known Allergies Lab Results:  Results for orders placed or performed during the hospital encounter of 05/28/15 (from the past 48 hour(s))  Glucose, capillary     Status: Abnormal   Collection Time: 05/28/15  5:03 PM  Result Value Ref Range   Glucose-Capillary 473 (H) 65 - 99 mg/dL   Comment 1 Notify RN    Comment 2 Document in Chart   Glucose, capillary     Status: Abnormal   Collection Time: 05/28/15  5:32 PM  Result Value Ref Range   Glucose-Capillary 447 (H) 65 - 99 mg/dL   Comment 1 Notify RN    Comment 2 Document in Chart   Glucose, capillary     Status: Abnormal   Collection Time: 05/28/15  6:49 PM  Result Value Ref Range   Glucose-Capillary 210 (H) 65 - 99 mg/dL   Comment 1 Notify RN    Comment 2 Document in Chart   Glucose, capillary     Status: Abnormal   Collection Time: 05/28/15  9:17 PM  Result Value Ref Range   Glucose-Capillary 102 (H) 65 - 99 mg/dL  Glucose, capillary     Status: Abnormal   Collection Time: 05/28/15 10:35 PM  Result Value Ref Range   Glucose-Capillary 213 (H) 65 - 99 mg/dL  Glucose, capillary     Status: Abnormal   Collection Time: 05/29/15  6:05 AM  Result Value Ref Range   Glucose-Capillary 198 (H) 65 - 99 mg/dL    Blood Alcohol level:  Lab Results  Component Value Date   ETH <5 05/27/2015   ETH <11 12/06/2012    Metabolic Disorder Labs:  Lab Results  Component Value Date   HGBA1C 11.1* 12/07/2012   MPG 272* 12/07/2012   MPG 329* 08/11/2010   No results found for: PROLACTIN Lab Results  Component Value Date   CHOL 173 08/16/2008   TRIG 34 08/16/2008   HDL 76 08/16/2008   CHOLHDL 2.3  Ratio 08/16/2008   VLDL 7 08/16/2008   LDLCALC 90 08/16/2008   LDLCALC 98 12/20/2007    Current Medications: Current Facility-Administered Medications  Medication Dose Route Frequency Provider Last Rate Last Dose  . acetaminophen (TYLENOL) tablet 650 mg  650 mg Oral Q6H PRN Sanjuana Kava, NP      . alum & mag hydroxide-simeth (MAALOX/MYLANTA) 200-200-20 MG/5ML suspension 30 mL  30 mL Oral Q4H PRN Sanjuana Kava, NP      . hydrOXYzine (ATARAX/VISTARIL) tablet 25 mg  25 mg Oral Q6H PRN Sanjuana Kava, NP      . insulin aspart (novoLOG) injection 0-9 Units  0-9 Units Subcutaneous TID WC Sanjuana Kava, NP   2 Units at 05/29/15 [phone]  . insulin aspart protamine- aspart (NOVOLOG MIX 70/30) injection 10 Units  10 Units Subcutaneous Q supper Sanjuana Kava, NP   10 Units at 05/28/15 1735  . insulin glargine (LANTUS) injection 10 Units  10 Units Subcutaneous QHS Sanjuana Kava, NP   10 Units at 05/28/15 2140  . magnesium hydroxide (MILK OF MAGNESIA) suspension 30 mL  30 mL Oral Daily PRN Sanjuana Kava, NP      . meloxicam (MOBIC) tablet 15 mg  15 mg Oral Daily Sanjuana Kava, NP   15 mg at 05/29/15 8295  . metFORMIN (GLUCOPHAGE) tablet 1,000 mg  1,000 mg Oral BID WC Sanjuana Kava, NP   1,000 mg at 05/29/15 6213  . nicotine (NICODERM CQ - dosed in mg/24 hours) patch 21 mg  21 mg Transdermal Q0600 Sanjuana Kava, NP   21 mg at 05/29/15 0830  . traZODone (DESYREL) tablet 50 mg  50 mg Oral QHS,MR X 1 Sanjuana Kava, NP   50 mg at 05/28/15 2200   PTA Medications: Prescriptions prior to admission  Medication Sig Dispense Refill Last Dose  . buPROPion (WELLBUTRIN XL) 150 MG 24 hr tablet Take 1 tablet (150 mg total) by mouth daily. For depression (Patient not taking: Reported on 05/27/2015) 30 tablet  0 Not Taking at Unknown time  . insulin aspart protamine- aspart (NOVOLOG MIX 70/30) (70-30) 100 UNIT/ML injection Inject 0.1 mLs (10 Units total) into the skin daily with supper. For diabetes control 10 mL 12 Past Month  at Unknown time  . insulin glargine (LANTUS) 100 UNIT/ML injection Inject 0.1 mLs (10 Units total) into the skin at bedtime. For diabetes control 10 mL 12 Past Month at Unknown time  . meloxicam (MOBIC) 15 MG tablet Take 1 tablet (15 mg total) by mouth daily. (Patient not taking: Reported on 05/27/2015) 30 tablet 0 Not Taking at Unknown time  . metFORMIN (GLUCOPHAGE) 1000 MG tablet Take 1 tablet (1,000 mg total) by mouth 2 (two) times daily with a meal. For diabetes management (Patient not taking: Reported on 05/27/2015) 60 tablet 0 Not Taking at Unknown time  . SITagliptin-MetFORMIN HCl (JANUMET PO) Take 1 tablet by mouth 2 (two) times daily.   Past Week at Unknown time  . traZODone (DESYREL) 50 MG tablet Take 1 tablet (50 mg total) by mouth at bedtime and may repeat dose one time if needed. For sleep (Patient not taking: Reported on 05/27/2015) 60 tablet 0 Not Taking at Unknown time    Musculoskeletal: Strength & Muscle Tone: within normal limits Gait & Station: normal Patient leans: normal  Psychiatric Specialty Exam: Physical Exam  Review of Systems  Constitutional: Positive for weight loss.  HENT: Negative.   Eyes: Negative.   Respiratory:       Pack every other day  Cardiovascular: Negative.   Gastrointestinal: Positive for abdominal pain.  Genitourinary: Negative.   Musculoskeletal: Negative.   Skin: Negative.   Neurological: Negative.   Endo/Heme/Allergies: Negative.   Psychiatric/Behavioral: Positive for depression and substance abuse.    Blood pressure 103/73, pulse 93, temperature 97.6 F (36.4 C), temperature source Oral, resp. rate 16, height 6' (1.829 m), weight 62.596 kg (138 lb), SpO2 100 %.Body mass index is 18.71 kg/(m^2).  General Appearance: normal  Eye Contact::  Fair  Speech:  Clear and Coherent and Pressured  Volume:  fluctuates  Mood:  Anxious, Depressed, Dysphoric and worried  Affect:  Anxious, Depressed, Dysphoric and worried  Thought Process:  Coherent  and Goal Directed at times circumstantial  Orientation:  Full (Time, Place, and Person)  Thought Content:  symptoms events worries concerns  Suicidal Thoughts:  No  Homicidal Thoughts:  No  Memory:  Immediate;   Fair Recent;   Fair Remote;   Fair  Judgement:  Fair  Insight:  Present and Shallow  Psychomotor Activity:  Restlessness  Concentration:  Fair  Recall:  [employer] of Knowledge:Fair  Language: Fair  Akathisia:  No  Handed:  Right  AIMS (if indicated):     Assets:  Desire for Improvement Housing Social Support  ADL's:  Intact  Cognition: WNL  Sleep:        Treatment Plan Summary: Daily contact with patient to assess and evaluate symptoms and progress in treatment and Medication management Supportive approach/coping skills Cocaine dependence; work a relapse prevention plan Depression; reassess for the need of antidepressants Hallucinations/paranoia: reassess for the need for antipsychotics Work with CBT/mindfulness Explore need for a residential treatment program Observation Level/Precautions:  15 minute checks  Laboratory:  As per the ED  Psychotherapy:  Individual/group  Medications:  Will monitor S/S of withdrawal and address accordingly  Consultations:    Discharge Concerns:  Need for rehab  Estimated LOS: 3-5 days  Other:     I certify that inpatient  services furnished can reasonably be expected to improve the patient's condition.    Rachael Fee, MD 2/16/201710:56 AM

## 2015-05-29 NOTE — [facility] Suicide Risk Assessment (Signed)
[facility] Admission Suicide Risk Assessment   Nursing information obtained from:  Patient Demographic factors:  Male, Divorced or widowed, Low socioeconomic status Current Mental Status:  Suicidal ideation indicated by patient, Plan includes specific time, place, or method, Self-harm thoughts, Intention to act on suicide plan, Belief that plan would result in death Loss Factors:  Loss of significant relationship, Financial problems / change in socioeconomic status Historical Factors:  Prior suicide attempts, Impulsivity Risk Reduction Factors:     Total Time spent with patient: 45 minutes Principal Problem: 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 Cocaine dependence (HCC) [F14.20] 03/01/2012    Priority: High  . Alcohol abuse [F10.10] 03/01/2012    Priority: High  . Major depression (HCC) [F32.9] 03/01/2012    Priority: High  . MUSCLE SPASM, BACK [M53.80] 02/12/2009    Priority: Low  . 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 Major depressive disorder, recurrent episode (HCC) [F33.9] 05/28/2015  . Suicidal ideation [R45.851]   . Alcohol withdrawal (HCC) [F10.239] 12/07/2012  . 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 HYPERCHOLESTEROLEMIA [E78.00] 05/09/2007  . SINUSITIS, CHRONIC [J32.9] 05/01/2007  . ALLERGIC RHINITIS [J30.9] 12/21/2006  . DIABETES MELLITUS, TYPE II [E11.9] 12/07/2006  . HYPERTENSION [I10] 12/07/2006   Subjective Data: see admission H and P  Continued Clinical Symptoms:  Alcohol Use Disorder Identification Test Final Score (AUDIT): 19 The "Alcohol Use Disorders Identification Test", Guidelines for Use in Primary Care, Second Edition.  World Science writer [facility]). Score  between 0-7:  no or low risk or alcohol related problems. Score between 8-15:  moderate risk of alcohol related problems. Score between 16-19:  high risk of alcohol related problems. Score 20 or above:  warrants further diagnostic evaluation for alcohol dependence and treatment.   CLINICAL FACTORS:   Depression:   Comorbid alcohol abuse/dependence Alcohol/Substance Abuse/Dependencies  Psychiatric Specialty Exam: ROS  Blood pressure 103/73, pulse 93, temperature 97.6 F (36.4 C), temperature source Oral, resp. rate 16, height 6' (1.829 m), weight 62.596 kg (138 lb), SpO2 100 %.Body mass index is 18.71 kg/(m^2).   COGNITIVE FEATURES THAT CONTRIBUTE TO RISK:  Closed-mindedness, Polarized thinking and Thought constriction (tunnel vision)    SUICIDE RISK:   Moderate:  Frequent suicidal ideation with limited intensity, and duration, some specificity in terms of plans, no associated intent, good self-control, limited dysphoria/symptomatology, some risk factors present, and identifiable protective factors, including available and accessible social support.  PLAN OF CARE: see admission H and P  I certify that inpatient services furnished can reasonably be expected to improve the patient's condition.   Rachael Fee, MD 05/29/2015, 4:50 PM

## 2015-05-29 NOTE — Plan of Care (Signed)
Problem: Diagnosis: Increased Risk For Suicide Attempt Goal: STG-Patient Will Comply With Medication Regime Outcome: Progressing Patient is compliant with his psychotropic and diabetic medication.  He indicates understanding of name of medication and dosage, along with indications.

## 2015-05-29 NOTE — Tx Team (Addendum)
Interdisciplinary Treatment Plan Update (Adult) Date: 05/29/2015    Time Reviewed: 9:30 AM  Progress in Treatment: Attending groups: Continuing to assess, patient new to milieu Participating in groups: Continuing to assess, patient new to milieu Taking medication as prescribed: Yes Tolerating medication: Yes Family/Significant other contact made: No, patient has declined for collateral contact Patient understands diagnosis: Yes Discussing patient identified problems/goals with staff: Yes Medical problems stabilized or resolved: Yes Denies suicidal/homicidal ideation: Yes Issues/concerns per patient self-inventory: Yes Other:  New problem(s) identified: N/A  Discharge Plan or Barriers: Patient will return home to follow up with outpatient services.   Reason for Continuation of Hospitalization:  Depression Anxiety Medication Stabilization   Comments: N/A  Estimated length of stay: Discharge anticipated for 05/30/15    Patient is a 55 year old male with a diagnosis of Substance Induced Mood Disorder as well as Alcohol & Stimulant Use Disorder. Pt presented to the hospital with substance abuse. Pt reports primary trigger(s) for admission was relationship issues and addiction. Patient will benefit from crisis stabilization, medication evaluation, group therapy and psycho education in addition to case management for discharge planning. At discharge, it is recommended that Pt remain compliant with established discharge plan and continued treatment.   Review of initial/current patient goals per problem list:  1. Goal(s): Patient will participate in aftercare plan   Met: Yes   Target date: 3-5 days post admission date   As evidenced by: Patient will participate within aftercare plan AEB aftercare provider and housing plan at discharge being identified.  2/16: Goal met. Patient will return home to follow up with outpatient services.    2. Goal (s): Patient will exhibit  decreased depressive symptoms and suicidal ideations.   Met: Adequate for discharge per MD   Target date: 3-5 days post admission date   As evidenced by: Patient will utilize self rating of depression at 3 or below and demonstrate decreased signs of depression or be deemed stable for discharge by MD.  2/16: Goal progressing. Patient reports an improvement in his depression and denies SI.  2/17: Adequate for discharge per MD. Patient rates depression at 5, denies SI. Reports feeling safe for discharge.      4. Goal(s): Patient will demonstrate decreased signs of withdrawal due to substance abuse   Met: Yes   Target date: 3-5 days post admission date   As evidenced by: Patient will produce a CIWA/COWS score of 0, have stable vitals signs, and no symptoms of withdrawal  2/16: Goal met. No withdrawal symptoms reported at this time per medical chart.     Attendees: Patient:    Family:    Physician: Dr. Parke Poisson; Dr. Sabra Heck 05/29/2015 9:30 AM  Nursing: Grayland Ormond, [address] [place], [place]  05/29/2015 9:30 AM  Clinical Social Worker: Tilden Fossa, LCSW 05/29/2015 9:30 AM  Other: Peri Maris, LCSWA; [place], LCSW  05/29/2015 9:30 AM  Other:  05/29/2015 9:30 AM  Other:    Other: Larose Kells, NP 05/29/2015 9:30 AM                    Scribe for Treatment Team:  Tilden Fossa, [place]

## 2015-05-29 NOTE — BHH Suicide Risk Assessment (Signed)
[facility] Admission Suicide Risk Assessment   Nursing information obtained from:  Patient Demographic factors:  Male, Divorced or widowed, Low socioeconomic status Current Mental Status:  Suicidal ideation indicated by patient, Plan includes specific time, place, or method, Self-harm thoughts, Intention to act on suicide plan, Belief that plan would result in death Loss Factors:  Loss of significant relationship, Financial problems / change in socioeconomic status Historical Factors:  Prior suicide attempts, Impulsivity Risk Reduction Factors:     Total Time spent with patient: 45 minutes Principal Problem: <principal problem not specified> Diagnosis:   Patient Active Problem List   Diagnosis Date Noted  . Cocaine dependence (HCC) [F14.20] 03/01/2012    Priority: High  . Alcohol abuse [F10.10] 03/01/2012    Priority: High  . Major depression (HCC) [F32.9] 03/01/2012    Priority: High  . MUSCLE SPASM, BACK [M53.80] 02/12/2009    Priority: Low  . Major depressive disorder, recurrent episode (HCC) [F33.9] 05/28/2015  . Suicidal ideation [R45.851]   . Alcohol withdrawal (HCC) [F10.239] 12/07/2012  . 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 HYPERCHOLESTEROLEMIA [E78.00] 05/09/2007  . SINUSITIS, CHRONIC [J32.9] 05/01/2007  . ALLERGIC RHINITIS [J30.9] 12/21/2006  . DIABETES MELLITUS, TYPE II [E11.9] 12/07/2006  . HYPERTENSION [I10] 12/07/2006   Subjective Data: see admission H and P  Continued Clinical Symptoms:  Alcohol Use Disorder Identification Test Final Score (AUDIT): 19 The "Alcohol Use Disorders Identification Test", Guidelines for Use in Primary Care, Second Edition.  World Science writer [facility]). Score between 0-7:  no or low risk or alcohol related problems. Score between 8-15:  moderate risk of alcohol related problems. Score between 16-19:  high risk of alcohol related  problems. Score 20 or above:  warrants further diagnostic evaluation for alcohol dependence and treatment.   CLINICAL FACTORS:   Depression:   Comorbid alcohol abuse/dependence Alcohol/Substance Abuse/Dependencies  Psychiatric Specialty Exam: ROS  Blood pressure 103/73, pulse 93, temperature 97.6 F (36.4 C), temperature source Oral, resp. rate 16, height 6' (1.829 m), weight 62.596 kg (138 lb), SpO2 100 %.Body mass index is 18.71 kg/(m^2).   COGNITIVE FEATURES THAT CONTRIBUTE TO RISK:  Closed-mindedness, Polarized thinking and Thought constriction (tunnel vision)    SUICIDE RISK:   Moderate:  Frequent suicidal ideation with limited intensity, and duration, some specificity in terms of plans, no associated intent, good self-control, limited dysphoria/symptomatology, some risk factors present, and identifiable protective factors, including available and accessible social support.  PLAN OF CARE: see admission H and P  I certify that inpatient services furnished can reasonably be expected to improve the patient's condition.   Rachael Fee, MD 05/29/2015, 4:46 PM

## 2015-05-30 DIAGNOSIS — F14251 Cocaine dependence with cocaine-induced psychotic disorder with hallucinations: Secondary | ICD-10-CM | POA: Insufficient documentation

## 2015-05-30 LAB — HEMOGLOBIN A1C
Hgb A1c MFr Bld: 14.3 % — ABNORMAL HIGH (ref 4.8–5.6)
Mean Plasma Glucose: 364 mg/dL

## 2015-05-30 LAB — GLUCOSE, CAPILLARY
GLUCOSE-CAPILLARY: 218 mg/dL — AB (ref 65–99)
Glucose-Capillary: 149 mg/dL — ABNORMAL HIGH (ref 65–99)

## 2015-05-30 MED ORDER — INSULIN ASPART PROT & ASPART (70-30 MIX) 100 UNIT/ML ~~LOC~~ SUSP: 10.0000 [IU] | Freq: Every day | SUBCUTANEOUS | Status: DC

## 2015-05-30 MED ORDER — MELOXICAM 15 MG PO TABS: 15.0000 mg | ORAL_TABLET | Freq: Every day | ORAL | Status: DC

## 2015-05-30 MED ORDER — TRAZODONE HCL 50 MG PO TABS: 50.0000 mg | ORAL_TABLET | Freq: Every evening | ORAL | Status: DC | PRN

## 2015-05-30 MED ORDER — METFORMIN HCL 1000 MG PO TABS: 1000.0000 mg | ORAL_TABLET | Freq: Two times a day (BID) | ORAL | Status: DC

## 2015-05-30 MED ORDER — NICOTINE 21 MG/24HR TD PT24: 21.0000 mg | MEDICATED_PATCH | Freq: Every day | TRANSDERMAL | Status: DC

## 2015-05-30 MED ORDER — INSULIN GLARGINE 100 UNIT/ML ~~LOC~~ SOLN: 10.0000 [IU] | Freq: Every day | SUBCUTANEOUS | Status: DC

## 2015-05-30 MED ORDER — HYDROXYZINE HCL 25 MG PO TABS: 25.0000 mg | ORAL_TABLET | Freq: Four times a day (QID) | ORAL | Status: DC | PRN

## 2015-05-30 NOTE — Progress Notes (Signed)
D: Patient alert and oriented x 4. Patient denies pain/SI/HI/AVH. Patient was very pleasant during assessment. Patient states, "I am ready to go home." Patient states he is being discharged tomorrow. Patient was educated on his CBG and ways to try and keep blood sugars more stable.  A: Staff to monitor Q 15 mins for safety. Encouragement and support offered. Scheduled medications administered per orders. R: Patient remains safe on the unit. Patient attended group tonight. Patient visible on hte unit and interacting with peers. Patient taking administered medications.

## 2015-05-30 NOTE — Progress Notes (Addendum)
  [facility] Adult Case Management Discharge Plan :  Will you be returning to the same living situation after discharge:  Yes,  patient plans to return home At discharge, do you have transportation home?: Yes,  patient's daughter will transport Do you have the ability to pay for your medications: Yes,  patient will be provided with prescripitons at discharge  Release of information consent forms completed and in the chart;  Patient's signature needed at discharge.  Patient to Follow up at: Follow-up Information    [facility] with [facility]   Specialty:  Behavioral Health   Why:  Walk-in clinic Tuesdays from 9am to 12pm for assessment for therapy and psychiatric medication management services.    Contact information:   [address] [place] [address] [phone]       Follow up with [facility] On 06/05/2015.   Why:  Appointment for primary care services on Thursday Feb. 23rd at 2:30pm. They can work with you on monitoring your diabetes. Please make sure to call office in advance if you need to reschedule your appt.    Contact information:   [address] [place] [address] [phone]      Next level of care provider has access to [facility] Link:no  Safety Planning and Suicide Prevention discussed: Yes,  with patient and daughter  Have you used any form of tobacco in the last 30 days? (Cigarettes, Smokeless Tobacco, Cigars, and/or Pipes): Yes  Has patient been referred to the Quitline?: Patient refused referral  Patient has been referred for addiction treatment: Yes  Yentl Verge, [place] 05/30/2015, 9:46 AM

## 2015-05-30 NOTE — BHH Group Notes (Signed)
   [facility] LCSW Aftercare Discharge Planning Group Note  05/30/2015  8:45 AM   Participation Quality: Alert, Appropriate and Oriented  Mood/Affect: Blunted  Depression Rating: 5  Anxiety Rating: 0  Thoughts of Suicide: Pt denies SI/HI  Will you contract for safety? Yes  Current AVH: Pt denies  Plan for Discharge/Comments: Pt attended discharge planning group and actively participated in group. CSW provided pt with today's workbook. Patient plans to return home to follow up with outpatient services.   Transportation Means: Pt reports access to transportation by daughter  Supports: No supports mentioned at this time  Samuella Bruin, MSW, [employer] Clinical Social Worker [employer] [phone]

## 2015-05-30 NOTE — Progress Notes (Signed)
D: James [name] was calm, pleasant, and cooperative upon approach. His affect is blunted. He denied SI/HI/AVH but complained of some generalized pain that he attributed to being on his feet for so long prior to admission. On his self-inventory, he reported good sleep, appetite, and concentration as well as normal energy level. He rated his depression 5 but put hopelessness and anxiety at a zero. He wants to stay focused on his recovery. He was OK speaking with a Theatre stage manager. A: Meds given as ordered. Q15 safety checks maintained. Support/encouragement offered. R: Pt remains free from harm and continues with treatment. Will continue to monitor for needs/safety.

## 2015-05-30 NOTE — [facility] Suicide Risk Assessment (Signed)
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 Cocaine dependence (HCC) [F14.20] 03/01/2012    Priority: High  . Alcohol abuse [F10.10] 03/01/2012    Priority: High  . Major depression (HCC) [F32.9] 03/01/2012    Priority: High  . MUSCLE SPASM, BACK [M53.80] 02/12/2009    Priority: Low  . 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 Major depressive disorder, recurrent episode (HCC) [F33.9] 05/28/2015  . Suicidal ideation [R45.851]   . Alcohol withdrawal (HCC) [F10.239] 12/07/2012  . 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 HYPERCHOLESTEROLEMIA [E78.00] 05/09/2007  . SINUSITIS, CHRONIC [J32.9] 05/01/2007  . ALLERGIC RHINITIS [J30.9] 12/21/2006  . DIABETES MELLITUS, TYPE II [E11.9] 12/07/2006  . HYPERTENSION [I10] 12/07/2006    Total Time spent with patient: 20 minutes  Musculoskeletal: Strength & Muscle Tone: within normal limits Gait & Station: normal Patient leans: normal  Psychiatric Specialty Exam: Review of Systems  Constitutional: Negative.   HENT: Negative.   Eyes: Negative.   Respiratory: Negative.   Cardiovascular: Negative.   Gastrointestinal: Negative.   Genitourinary: Negative.   Musculoskeletal: Negative.   Skin: Negative.   Neurological: Negative.   Endo/Heme/Allergies: Negative.   Psychiatric/Behavioral: Positive for depression and substance abuse.    Blood pressure 120/71, pulse 91, temperature 97.6 F (36.4 C), temperature source Oral, resp. rate 16, height 6' (1.829 m), weight 62.596 kg (138 lb), SpO2 100 %.Body mass index is 18.71 kg/(m^2).  General Appearance: Fairly Groomed  Patent attorney::   Fair  Speech:  Clear and Coherent409  Volume:  Normal  Mood:  Anxious and worried  Affect:  Appropriate  Thought Process:  Coherent and Goal Directed  Orientation:  Full (Time, Place, and Person)  Thought Content:  plans as he moves on, relapse prevention plan  Suicidal Thoughts:  No  Homicidal Thoughts:  No  Memory:  Immediate;   Fair Recent;   Fair Remote;   Fair  Judgement:  Fair  Insight:  Present and Shallow  Psychomotor Activity:  Normal  Concentration:  Fair  Recall:  [employer] of Knowledge:Fair  Language: Fair  Akathisia:  No  Handed:  Right  AIMS (if indicated):     Assets:  Desire for Improvement Housing Social Support  Sleep:  Number of Hours: 6  Cognition: WNL  ADL's:  Intact  IN full contact with reality. There are no active S/S of withdrawal. There are no active SI plans or intent. He is committed to abstinence. He states he did it before and can do it again. Has a lot of family support. He is also committed to take care of his physical health (Diabetes)  Mental Status Per Nursing Assessment::   On Admission:  Suicidal ideation indicated by patient, Plan includes specific time, place, or method, Self-harm thoughts, Intention to act on suicide plan, Belief that plan would result in death  Demographic Factors:  Male  Loss Factors: Decline in physical health and Financial problems/change in socioeconomic status  Historical Factors: none identifed  Risk Reduction Factors:   Sense of responsibility to family and Positive social support  Continued Clinical Symptoms:  Depression:   Comorbid alcohol abuse/dependence Alcohol/Substance Abuse/Dependencies  Cognitive Features That Contribute To Risk:  None    Suicide Risk:  Minimal: No identifiable  suicidal ideation.  Patients presenting with no risk factors but with morbid ruminations; may be classified as minimal risk based on the severity of the depressive symptoms  Follow-up Information    Follow up with  [facility]   Specialty:  Behavioral Health   Why:  Walk-in clinic Tuesdays from 9am to 12pm for assessment for therapy and psychiatric medication management services.    Contact information:   [address] [place] [address] [phone]       Follow up with [facility] On 06/05/2015.   Why:  Appointment for primary care services on Thursday Feb. 23rd at 2:30pm. They can work with you on monitoring your diabetes. Please make sure to call office in advance if you need to reschedule your appt.    Contact information:   [address] [place] [address] [phone]      Plan Of Care/Follow-up recommendations:  Activity:  as tolerated Diet:  regular Follow up as above Weslee Prestage A, MD 05/30/2015, 11:02 AM

## 2015-05-30 NOTE — Plan of Care (Signed)
Problem: Ineffective individual coping Goal: STG: Patient will remain free from self harm Outcome: Progressing Pt denies SI.  Goal: STG-Increase in ability to manage activities of daily living Outcome: Adequate for Discharge He's independent with ADLs.

## 2015-05-30 NOTE — BHH Suicide Risk Assessment (Signed)
[facility] INPATIENT:  Family/Significant Other Suicide Prevention Education  Suicide Prevention Education:  Education Completed; daughter Demontra Barthell [phone],  (name of family member/significant other) has been identified by the patient as the family member/significant other with whom the patient will be residing, and identified as the person(s) who will aid the patient in the event of a mental health crisis (suicidal ideations/suicide attempt).  With written consent from the patient, the family member/significant other has been provided the following suicide prevention education, prior to the and/or following the discharge of the patient.  The suicide prevention education provided includes the following:  Suicide risk factors  Suicide prevention and interventions  National Suicide Hotline telephone number  [facility] assessment telephone number  [facility] Emergency Assistance 911  [facility] and/or Residential Mobile Crisis Unit telephone number  Request made of family/significant other to:  Remove weapons (e.g., guns, rifles, knives), all items previously/currently identified as safety concern.    Remove drugs/medications (over-the-counter, prescriptions, illicit drugs), all items previously/currently identified as a safety concern.  The family member/significant other verbalizes understanding of the suicide prevention education information provided.  The family member/significant other agrees to remove the items of safety concern listed above.  Vivica Dobosz, [place] 05/30/2015, 10:12 AM

## 2015-05-30 NOTE — Progress Notes (Signed)
Patient ID: James [name], male   DOB: 07/12/1960, 55 y.o.   MRN: [id number]  James [name] was given med samples, scripts, AVS, SRA, and transition paperwork. Paperwork was explained and pt verbalized understanding. Pt verbalized readiness for discharge and signed for all, including belongings. Pt was escorted to lobby and discharged per MD order.

## 2015-05-30 NOTE — Discharge Summary (Signed)
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 Alcohol withdrawal (HCC) [F10.239] 12/07/2012    Priority: High  . 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 Major depressive disorder, recurrent episode (HCC) [F33.9] 05/28/2015  . Suicidal ideation [R45.851]   . Cocaine dependence (HCC) [F14.20] 03/01/2012  . Alcohol abuse [F10.10] 03/01/2012  . Major depression (HCC) [F32.9] 03/01/2012  . 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 MUSCLE SPASM, BACK [M53.80] 02/12/2009  . 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 HYPERCHOLESTEROLEMIA [E78.00] 05/09/2007  . SINUSITIS, CHRONIC [J32.9] 05/01/2007  . ALLERGIC RHINITIS [J30.9] 12/21/2006  . DIABETES MELLITUS, TYPE II [E11.9] 12/07/2006  . HYPERTENSION [I10] 12/07/2006   Past Psychiatric History: Major depression, Cocaine dependence  Past Medical History:  Past Medical History  Diagnosis Date  . Diabetes mellitus without complication [facility])     Past Surgical History  Procedure Laterality Date  . Finger surgery     Family History: History reviewed. No pertinent family history.  Family Psychiatric  History: See H&P  Social History:  History  Alcohol Use  . 37.2 oz/week  . 12 Cans of beer, 50 Shots of liquor per week    Comment: 2-40 oz daily     History  Drug Use  .  3.00 per week  . Special: Cocaine, "Crack" cocaine    Social History   Social History  . Marital Status: Legally Separated    Spouse Name: N/A  . Number of Children: N/A  . Years of Education: N/A   Social History Main Topics  . Smoking status: Current Every Day Smoker -- 0.50 packs/day for 23 years    Types: Cigarettes  . Smokeless tobacco: Never Used  . Alcohol Use: 37.2 oz/week    12 Cans of beer, 50 Shots of liquor per week     Comment: 2-40 oz daily  . Drug Use: 3.00 per week    Special: Cocaine, "Crack" cocaine  . Sexual Activity: Not Currently   Other Topics Concern  . None   Social History Narrative   Hospital Course: 55 Y/O male who states that after he left here last time, he was abstinent for 17 months (going to meetings, hanging out with the right people) and he picked up July 2016. States he started working in [employer]. States that to get to the place worked he had to go by the places he used to use. States he feels that this was the trigger. [facility]) states that his wife made reservations for [facility] during the summer. States he stop while on his way there and he was offered some cocaine. He did not made it to [facility]. Has bee using cocaine since then. 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 States he got to a point he was wanting to kill himself full of shame and quilt. States he was high. He came for  help. Started using crack in hi late 40's.  Jonluke was admitted to the adult unit for Suicidal ideations related to his recent relapse on Cocaine use. He came to the hospital for help. He did not need detox treatment as cocaine has no established detox protocols. During his admission assessment, he was evaluated & his symptoms identified. Medication management was discussed and initiated targeting those presenting symptoms. He was oriented to the unit & encouraged to participate in the unit programming. His other presenting  medical problems were identified, treated appropriately by re-resuming his pertinent home medications that he has not been on for a long time. A diabetic coordinator was also consulted for a guide on the better management of his diabetic condition. He tolerated his treatment regimen without any significant adverse effects and or reactions.  During Sherill's hospital stay, he was evaluated daily by a clinical provider to ascertain his response to his treatment regimen. As the day goes by, improvement was noted by his reports of decreasing symptoms, medication tolerance, behavior & participation in the unit programming.  He was required on daily basis to complete a self inventory asssessment noting mood, mental status, any new symptoms, anxiety or concerns. His symptoms responded well to his treatment regimen. Also, being in a therapeutic & supportive environment assisted in his mood stability. Torrez did present appropriate behavior & was motivated for recovery. He worked closely with the treatment team and case manager to develop a discharge plan with appropriate goals to maintain mental/medical stability after discharge. Coping skills, problem solving as well as relaxation therapies were also part of his unit programming.  On this day of his hospital discharge, Julio is in much improved condition than upon admission. His symptoms were reported as significantly decreased or resolved completely. Upon discharge, he adamantly denies any SI/HI & voiced no AVH. He was motivated to continue taking medication with a goal of continued improvement in mental/medical health. He was discharged on Hydroxyzine 25 mg prn for anxiety, Nicotine patch 21 mg for smoking cessation & trazodone 50 mg for insomnia. He was also provided with prescriptions for all his medications for his medical condition with a referral & appointment to follow-up at the [facility] here in [place], [place].  Wendle was discharged  home with a plan to follow up as noted below. He received from the [facility] pharmacy, a 7 days worth, supply samples of his [facility] discharge medications. He left [facility] with all personal belongings in no apparent distress. Transportation per wife.  Consults:  psychiatry  Significant Diagnostic Studies:  labs: CBC with diff, CMP, UDS, toxicology tests, U/A, results reviewed, stable  Physical Findings: AIMS: Facial and Oral Movements Muscles of Facial Expression: None, normal Lips and Perioral Area: None, normal Jaw: None, normal Tongue: None, normal,Extremity Movements Upper (arms, wrists, hands, fingers): None, normal Lower (legs, knees, ankles, toes): None, normal, Trunk Movements Neck, shoulders, hips: None, normal, Overall Severity Severity of abnormal movements (highest score from questions above): None, normal Incapacitation due to abnormal movements: None, normal Patient's awareness of abnormal movements (rate only patient's report): No Awareness, Dental Status Current problems with teeth and/or dentures?: No Does patient usually wear dentures?: No  CIWA:  CIWA-Ar Total: 2 COWS:     Musculoskeletal: Strength & Muscle Tone: within normal limits Gait & Station: normal Patient leans: N/A  Psychiatric Specialty Exam: Review of Systems  Constitutional: Negative.   HENT: Negative.   Eyes: Negative.   Respiratory: Negative.   Cardiovascular: Negative.   Gastrointestinal:  Negative.   Genitourinary: Negative.   Musculoskeletal: Negative.   Skin: Negative.   Neurological: Negative.   Endo/Heme/Allergies: Negative.   Psychiatric/Behavioral: Positive for depression (Stable), hallucinations (Hx of) and substance abuse (x. Cocaine dependence). Negative for suicidal ideas and memory loss. The patient has insomnia (Stable). The patient is not nervous/anxious.     Blood pressure 120/71, pulse 91, temperature 97.6 F (36.4 C), temperature source Oral, resp. rate 16, height 6' (1.829 m), weight  62.596 kg (138 lb), SpO2 100 %.Body mass index is 18.71 kg/(m^2).  See Md's SRA   Have you used any form of tobacco in the last 30 days? (Cigarettes, Smokeless Tobacco, Cigars, and/or Pipes): Yes  Has this patient used any form of tobacco in the last 30 days? (Cigarettes, Smokeless Tobacco, Cigars, and/or Pipes) Yes, Nicotine patch provided on admission.  Blood Alcohol level:  Lab Results  Component Value Date   [facility] <5 05/27/2015   ETH <11 12/06/2012   Metabolic Disorder Labs:  Lab Results  Component Value Date   HGBA1C 14.3* 05/29/2015   MPG 364 05/29/2015   MPG 272* 12/07/2012   No results found for: PROLACTIN Lab Results  Component Value Date   CHOL 173 08/16/2008   TRIG 34 08/16/2008   HDL 76 08/16/2008   CHOLHDL 2.3 Ratio 08/16/2008   VLDL 7 08/16/2008   LDLCALC 90 08/16/2008   LDLCALC 98 12/20/2007   See Psychiatric Specialty Exam and Suicide Risk Assessment completed by Attending Physician prior to discharge.  Discharge destination:  Home  Is patient on multiple antipsychotic therapies at discharge:  No   Has Patient had three or more failed trials of antipsychotic monotherapy by history:  No  Recommended Plan for Multiple Antipsychotic Therapies: NA     Discharge Instructions    Discharge instructions    Complete by:  As directed   Go to the [facility] on Thursday 06-05-15 by 10:00 am for your diabetes care. Take all yr medicines with you. DO NOT MISS THIS APPT>            Medication List    STOP taking these medications        buPROPion 150 MG 24 hr tablet  Commonly known as:  WELLBUTRIN XL     JANUMET PO      TAKE these medications      Indication   hydrOXYzine 25 MG tablet  Commonly known as:  ATARAX/VISTARIL  Take 1 tablet (25 mg total) by mouth every 6 (six) hours as needed (sleep). & anxiety   Indication:  Anxiety/insomnia     insulin aspart protamine- aspart (70-30) 100 UNIT/ML injection  Commonly known as:  NOVOLOG  MIX 70/30  Inject 0.1 mLs (10 Units total) into the skin daily with supper. For diabetes control   Indication:  Type 2 Diabetes     insulin glargine 100 UNIT/ML injection  Commonly known as:  LANTUS  Inject 0.1 mLs (10 Units total) into the skin at bedtime. For diabetes control   Indication:  Type 2 Diabetes     meloxicam 15 MG tablet  Commonly known as:  MOBIC  Take 1 tablet (15 mg total) by mouth daily. For arthritic pain   Indication:  Joint Damage causing Pain and Loss of Function     metFORMIN 1000 MG tablet  Commonly known as:  GLUCOPHAGE  Take 1 tablet (1,000 mg total) by mouth 2 (two) times daily with a meal. For diabetes management   Indication:  Type  2 Diabetes     nicotine 21 mg/24hr patch  Commonly known as:  NICODERM CQ - dosed in mg/24 hours  Place 1 patch (21 mg total) onto the skin daily at 6 (six) AM. For smoking cessation   Indication:  Nicotine Addiction     traZODone 50 MG tablet  Commonly known as:  DESYREL  Take 1 tablet (50 mg total) by mouth at bedtime and may repeat dose one time if needed. For sleep   Indication:  Trouble Sleeping       Follow-up recommendations:  Activity:  As tolerated Diet: As recommended by your primary care doctor. Keep all scheduled follow-up appointments as recommended.  Comments: Take all your medications as prescribed by your mental healthcare provider. Report any adverse effects and or reactions from your medicines to your outpatient provider promptly. Patient is instructed and cautioned to not engage in alcohol and or illegal drug use while on prescription medicines. In the event of worsening symptoms, patient is instructed to call the crisis hotline, 911 and or go to the nearest ED for appropriate evaluation and treatment of symptoms. Follow-up with your primary care provider for your other medical issues, concerns and or health care needs.   Signed: Sanjuana Kava, NP, PMHNP-BC 05/30/2015, 10:42 AM  I personally  assessed the patient and formulated the plan Madie Reno A. Dub Mikes, M.D.

## 2015-06-05 ENCOUNTER — Inpatient Hospital Stay: Payer: Self-pay | Admitting: Family Medicine

## 2015-06-06 ENCOUNTER — Encounter (HOSPITAL_COMMUNITY): Payer: Self-pay

## 2015-06-06 ENCOUNTER — Encounter (HOSPITAL_COMMUNITY): Payer: Self-pay | Admitting: Emergency Medicine

## 2015-06-06 ENCOUNTER — Emergency Department (HOSPITAL_COMMUNITY)
Admission: EM | Admit: 2015-06-06 | Discharge: 2015-06-06 | Disposition: A | Discharge status: Other Acute Inpatient Hospital | Payer: PRIVATE HEALTH INSURANCE | Attending: Emergency Medicine | Admitting: Emergency Medicine

## 2015-06-06 ENCOUNTER — Observation Stay (HOSPITAL_COMMUNITY)
Admission: AD | Admit: 2015-06-06 | Discharge: 2015-06-07 | Disposition: A | Discharge status: Home / Self Care | Payer: Self-pay | Source: Intra-hospital | Attending: Psychiatry | Admitting: Psychiatry

## 2015-06-06 DIAGNOSIS — E7800 Pure hypercholesterolemia, unspecified: Secondary | ICD-10-CM | POA: Insufficient documentation

## 2015-06-06 DIAGNOSIS — Z794 Long term (current) use of insulin: Secondary | ICD-10-CM | POA: Insufficient documentation

## 2015-06-06 DIAGNOSIS — E119 Type 2 diabetes mellitus without complications: Secondary | ICD-10-CM | POA: Insufficient documentation

## 2015-06-06 DIAGNOSIS — I10 Essential (primary) hypertension: Secondary | ICD-10-CM | POA: Insufficient documentation

## 2015-06-06 DIAGNOSIS — F1424 Cocaine dependence with cocaine-induced mood disorder: Principal | ICD-10-CM | POA: Insufficient documentation

## 2015-06-06 DIAGNOSIS — F329 Major depressive disorder, single episode, unspecified: Secondary | ICD-10-CM | POA: Insufficient documentation

## 2015-06-06 DIAGNOSIS — F339 Major depressive disorder, recurrent, unspecified: Secondary | ICD-10-CM | POA: Insufficient documentation

## 2015-06-06 DIAGNOSIS — L309 Dermatitis, unspecified: Secondary | ICD-10-CM | POA: Insufficient documentation

## 2015-06-06 DIAGNOSIS — F1494 Cocaine use, unspecified with cocaine-induced mood disorder: Secondary | ICD-10-CM

## 2015-06-06 DIAGNOSIS — Z7984 Long term (current) use of oral hypoglycemic drugs: Secondary | ICD-10-CM | POA: Insufficient documentation

## 2015-06-06 DIAGNOSIS — F1420 Cocaine dependence, uncomplicated: Secondary | ICD-10-CM

## 2015-06-06 DIAGNOSIS — Z791 Long term (current) use of non-steroidal anti-inflammatories (NSAID): Secondary | ICD-10-CM | POA: Insufficient documentation

## 2015-06-06 DIAGNOSIS — F17210 Nicotine dependence, cigarettes, uncomplicated: Secondary | ICD-10-CM | POA: Insufficient documentation

## 2015-06-06 DIAGNOSIS — R45851 Suicidal ideations: Secondary | ICD-10-CM | POA: Insufficient documentation

## 2015-06-06 LAB — COMPREHENSIVE METABOLIC PANEL
ALT: 61 U/L (ref 17–63)
AST: 38 U/L (ref 15–41)
Albumin: 3.9 g/dL (ref 3.5–5.0)
Alkaline Phosphatase: 66 U/L (ref 38–126)
Anion gap: 10 (ref 5–15)
BUN: 11 mg/dL (ref 6–20)
CO2: 27 mmol/L (ref 22–32)
Calcium: 8.9 mg/dL (ref 8.9–10.3)
Chloride: 96 mmol/L — ABNORMAL LOW (ref 101–111)
Creatinine, Ser: 0.91 mg/dL (ref 0.61–1.24)
GFR calc Af Amer: >60 mL/min (ref >60)
GFR calc non Af Amer: >60 mL/min (ref >60)
Glucose, Bld: 339 mg/dL — ABNORMAL HIGH (ref 65–99)
Potassium: 4.7 mmol/L (ref 3.5–5.1)
Sodium: 133 mmol/L — ABNORMAL LOW (ref 135–145)
Total Bilirubin: 0.6 mg/dL (ref 0.3–1.2)
Total Protein: 6.7 g/dL (ref 6.5–8.1)

## 2015-06-06 LAB — CBC
HCT: 43.6 % (ref 39.0–52.0)
Hemoglobin: 14.5 g/dL (ref 13.0–17.0)
MCH: 29.1 pg (ref 26.0–34.0)
MCHC: 33.3 g/dL (ref 30.0–36.0)
MCV: 87.6 fL (ref 78.0–100.0)
Platelets: 272 10*3/uL (ref 150–400)
RBC: 4.98 MIL/uL (ref 4.22–5.81)
RDW: 14.3 % (ref 11.5–15.5)
WBC: 4.8 10*3/uL (ref 4.0–10.5)

## 2015-06-06 LAB — ACETAMINOPHEN LEVEL: Acetaminophen (Tylenol), Serum: <10 ug/mL — ABNORMAL LOW (ref 10–30)

## 2015-06-06 LAB — GLUCOSE, CAPILLARY
Glucose-Capillary: 400 mg/dL — ABNORMAL HIGH (ref 65–99)
Glucose-Capillary: 175 mg/dL — ABNORMAL HIGH (ref 65–99)

## 2015-06-06 LAB — RAPID URINE DRUG SCREEN, HOSP PERFORMED
Amphetamines: NOT DETECTED
Barbiturates: NOT DETECTED
Benzodiazepines: NOT DETECTED
Cocaine: POSITIVE — AB
Opiates: NOT DETECTED
Tetrahydrocannabinol: NOT DETECTED

## 2015-06-06 LAB — SALICYLATE LEVEL: Salicylate Lvl: <4 mg/dL (ref 2.8–30.0)

## 2015-06-06 LAB — CBG MONITORING, ED
Glucose-Capillary: 304 mg/dL — ABNORMAL HIGH (ref 65–99)
Glucose-Capillary: 209 mg/dL — ABNORMAL HIGH (ref 65–99)

## 2015-06-06 LAB — ETHANOL: Alcohol, Ethyl (B): <5 mg/dL (ref <5)

## 2015-06-06 MED ORDER — HYDROXYZINE HCL 25 MG PO TABS: 25.0000 mg | ORAL_TABLET | Freq: Four times a day (QID) | ORAL | Status: DC | PRN

## 2015-06-06 MED ORDER — TRAZODONE HCL 50 MG PO TABS: 50.0000 mg | ORAL_TABLET | Freq: Every evening | ORAL | Status: DC | PRN

## 2015-06-06 MED ORDER — ALUM & MAG HYDROXIDE-SIMETH 200-200-20 MG/5ML PO SUSP: 30.0000 mL | ORAL | Status: DC | PRN

## 2015-06-06 MED ORDER — INSULIN GLARGINE 100 UNIT/ML ~~LOC~~ SOLN: 10.0000 [IU] | Freq: Every day | SUBCUTANEOUS | Status: DC

## 2015-06-06 MED ORDER — MAGNESIUM HYDROXIDE 400 MG/5ML PO SUSP: 30.0000 mL | Freq: Every day | ORAL | Status: DC | PRN

## 2015-06-06 MED ORDER — METFORMIN HCL 500 MG PO TABS
1000.0000 mg | ORAL_TABLET | Freq: Two times a day (BID) | ORAL | Status: DC
  Filled 2015-06-06 (×2): qty 2

## 2015-06-06 MED ORDER — INSULIN ASPART 100 UNIT/ML ~~LOC~~ SOLN
4.0000 [IU] | Freq: Once | SUBCUTANEOUS | Status: AC
  Administered 2015-06-06: 4 [IU] via SUBCUTANEOUS
  Filled 2015-06-06: qty 1

## 2015-06-06 MED ORDER — INSULIN ASPART PROT & ASPART (70-30 MIX) 100 UNIT/ML ~~LOC~~ SUSP
10.0000 [IU] | Freq: Every day | SUBCUTANEOUS | Status: DC
  Filled 2015-06-06: qty 10

## 2015-06-06 MED ORDER — INSULIN ASPART PROT & ASPART (70-30 MIX) 100 UNIT/ML ~~LOC~~ SUSP
10.0000 [IU] | Freq: Every day | SUBCUTANEOUS | Status: DC
  Administered 2015-06-06: 10 [IU] via SUBCUTANEOUS

## 2015-06-06 MED ORDER — ACETAMINOPHEN 325 MG PO TABS: 650.0000 mg | ORAL_TABLET | Freq: Four times a day (QID) | ORAL | Status: DC | PRN

## 2015-06-06 MED ORDER — NICOTINE 21 MG/24HR TD PT24: 21.0000 mg | MEDICATED_PATCH | Freq: Every day | TRANSDERMAL | Status: DC

## 2015-06-06 MED ORDER — METFORMIN HCL 500 MG PO TABS
1000.0000 mg | ORAL_TABLET | Freq: Two times a day (BID) | ORAL | Status: DC
  Administered 2015-06-06 – 2015-06-07 (×2): 1000 mg via ORAL
  Filled 2015-06-06 (×2): qty 2

## 2015-06-06 NOTE — Progress Notes (Signed)
Inpatient Diabetes Program Recommendations  AACE/ADA: New Consensus Statement on Inpatient Glycemic Control (2015)  Target Ranges:  Prepandial:   less than 140 mg/dL      Peak postprandial:   less than 180 mg/dL (1-2 hours)      Critically ill patients:  140 - 180 mg/dL   Review of Glycemic Control  Diabetes history: DM2 Outpatient Diabetes medications: Lantus 10 units QHS, 70/30 10 units ac supper. (Has not been taking Lantus 10 units), metformin 1000 mg bid Current orders for Inpatient glycemic control: 1x dose of Novolog 4 units this am  Results for SHARIEF, WAINWRIGHT (MRN [id number]) as of 06/06/2015 09:34  Ref. Range 06/06/2015 06:46  Sodium Latest Ref Range: 135-145 mmol/L 133 (L)  Potassium Latest Ref Range: 3.5-5.1 mmol/L 4.7  Chloride Latest Ref Range: 101-111 mmol/L 96 (L)  CO2 Latest Ref Range: 22-32 mmol/L 27  BUN Latest Ref Range: 6-20 mg/dL 11  Creatinine Latest Ref Range: 0.61-1.24 mg/dL 0.91  Calcium Latest Ref Range: 8.9-10.3 mg/dL 8.9  EGFR (Non-African Amer.) Latest Ref Range: >60 mL/min >60  EGFR (African American) Latest Ref Range: >60 mL/min >60  Glucose Latest Ref Range: 65-99 mg/dL 339 (H)  Anion gap Latest Ref Range: 5-15  10  Results for CALLIN, ASHE (MRN [id number]) as of 06/06/2015 09:34  Ref. Range 05/29/2015 06:24  Hemoglobin A1C Latest Ref Range: 4.8-5.6 % 14.3 (H)  Results for YUTA, CIPOLLONE (MRN [id number]) as of 06/06/2015 09:34  Ref. Range 05/27/2015 06:46 06/06/2015 06:46  Glucose Latest Ref Range: 65-99 mg/dL 331 (H) 339 (H)   Pt obviously non-compliant with diabetes meds and insulin with HgbA1C of 14.3% Needs f/u with PCP to manage DM. Needs affordable meds. If no meter at home, please order glucose meter, strips and lancets.  Inpatient Diabetes Program Recommendations:    70/30 8 units bid (breakfast and supper) Titrate if CBGs > 180 mg/dL Novolog moderate tidwc and hs. Could case manager give pt info on [facility] - as he could get his meds and  meter there.  Will follow. Thank you. Lorenda Peck, RD, LDN, CDE Inpatient Diabetes Coordinator [phone]

## 2015-06-06 NOTE — Plan of Care (Signed)
[facility] Crisis Plan  Reason for Crisis Plan:  Crisis Stabilization   Plan of Care:  Referral for Substance Abuse  Family Support: Wife and grown daughter    Current Living Environment:  Living Arrangements: Spouse/significant other, Children  Insurance:   Hospital Account    Name Acct ID Class Status Primary Coverage   James, [name] [id number] BEHAVIORAL HEALTH OBSERVATION Open None        Guarantor Account (for Hospital Account [id number])    Name Relation to Pt Service Area Active? Acct Type   James [name] Self [facility] Yes Behavioral Health   Address Phone       [address] [place], [place] [address] [phone](H)          Coverage Information (for Hospital Account [id number])    Not on file      Legal Guardian:     Primary Care Provider:  No primary care provider on file.  Current Outpatient Providers:  Unknown  Psychiatrist:     Counselor/Therapist:     Compliant with Medications:  No  Additional Information:   James [name] 2/24/20172:52 PM

## 2015-06-06 NOTE — BH Assessment (Signed)
Assessment Note  James [name] is an 55 y.o. male. Patient reports stopping a LEO to request the need to go to the ED because of suicidal thoughts with plan.  Patient reports walking over the overpass and planning to jump off to end it all.  "I was walking on the overpass and said to myself I should jump to get it over with".  Patient was recently admitted to [facility] 2/15-2/17 and discharged by Dr. Dub Mikes.  According to the discharge notes he was set up to follow up with [facility] and [place].  Patient reports he did not comply with any discharge plans.    Substance use includes: Crack, started 59s of age, daily use, $40 or more, and last used this morning. Alcohol, started 55yo, couple times a week, half pint or more, and last used yesterday.    This Clinical research associate consulted with Dr. Ladona Ridgel and Catha Nottingham, NP it is recommended to refer for observation unit.    Diagnosis: Substance Induced Mood Disorder  Past Medical History:  Past Medical History  Diagnosis Date  . Diabetes mellitus without complication [facility])     Past Surgical History  Procedure Laterality Date  . Finger surgery      Family History:  Family History  Problem Relation Age of Onset  . Diabetes Other   . Hypertension Other     Social History:  reports that he has been smoking Cigarettes.  He has a 11.5 pack-year smoking history. He has never used smokeless tobacco. He reports that he drinks alcohol. He reports that he uses illicit drugs (Cocaine and "Crack" cocaine) about 3 times per week.  Additional Social History:  Alcohol / Drug Use Pain Medications: see chart Prescriptions: see chart Over the Counter: see chart History of alcohol / drug use?: Yes Substance #1 Name of Substance 1: Crack cocaine 1 - Age of First Use: 40s 1 - Amount (size/oz): $40 or more 1 - Frequency: daily 1 - Duration: ongoing 1 - Last Use / Amount: today unknown amount Substance #2 Name of Substance 2: Alcohol 2 - Age of First Use: 16 2 - Amount  (size/oz): half pint or more 2 - Frequency: couple times a week 2 - Duration: ongoing 2 - Last Use / Amount: yesterday  CIWA: CIWA-Ar BP: 127/81 mmHg Pulse Rate: 93 Nausea and Vomiting: no nausea and no vomiting Tactile Disturbances: none Tremor: no tremor Auditory Disturbances: not present Paroxysmal Sweats: no sweat visible Visual Disturbances: not present Anxiety: no anxiety, at ease Headache, Fullness in Head: none present Agitation: somewhat more than normal activity Orientation and Clouding of Sensorium: oriented and can do serial additions CIWA-Ar Total: 1 COWS:    Allergies: No Known Allergies  Home Medications:  (Not in a hospital admission)  OB/GYN Status:  No LMP for male patient.  General Assessment Data [facility] of Assessment: [facility] TTS Assessment: In system Is this a Tele or Face-to-Face Assessment?: Face-to-Face Is this an Initial Assessment or a Re-assessment for this encounter?: Initial Assessment Marital status: Married Is patient pregnant?: No Pregnancy Status: No Living Arrangements: Spouse/significant other Can pt return to current living arrangement?: No (unsure) Admission Status: Voluntary Is patient capable of signing voluntary admission?: Yes Referral Source: Self/Family/Friend Insurance type: none  Medical Screening Exam [facility] Walk-in ONLY) Medical Exam completed: Yes  Crisis Care Plan Living Arrangements: Spouse/significant other Name of Psychiatrist: none Name of Therapist: none  Education Status Is patient currently in school?: No Highest grade of school patient has completed: 64  Risk to self with the past 6 months Suicidal Ideation: Yes-Currently Present Has patient been a risk to self within the past 6 months prior to admission? : Yes Suicidal Intent: Yes-Currently Present Has patient had any suicidal intent within the past 6 months prior to admission? : Yes Is patient at risk for suicide?: Yes Suicidal Plan?: Yes-Currently  Present Has patient had any suicidal plan within the past 6 months prior to admission? : Yes Specify Current Suicidal Plan: jump off overpass Access to Means: Yes Specify Access to Suicidal Means: access to nearby overpass What has been your use of drugs/alcohol within the last 12 months?: crack, alcohol Previous Attempts/Gestures: Yes How many times?: 1 Other Self Harm Risks: none Triggers for Past Attempts: Other (Comment) (SA) Intentional Self Injurious Behavior: None Family Suicide History: No Recent stressful life event(s): Conflict (Comment), Loss (Comment), Other (Comment) (SA) Persecutory voices/beliefs?: No Depression: Yes Depression Symptoms: Despondent, Isolating, Fatigue, Guilt, Loss of interest in usual pleasures, Feeling angry/irritable, Feeling worthless/self pity (hopelessness) Substance abuse history and/or treatment for substance abuse?: Yes  Risk to Others within the past 6 months Homicidal Ideation: No-Not Currently/Within Last 6 Months Does patient have any lifetime risk of violence toward others beyond the six months prior to admission? : No Thoughts of Harm to Others: No-Not Currently Present/Within Last 6 Months Current Homicidal Intent: No-Not Currently/Within Last 6 Months Current Homicidal Plan: No-Not Currently/Within Last 6 Months Access to Homicidal Means: No Identified Victim: na History of harm to others?: No Assessment of Violence: None Noted Does patient have access to weapons?: No Criminal Charges Pending?: Yes Describe Pending Criminal Charges: possession Does patient have a court date: Yes Court Date: 06/18/15 Is patient on probation?: No  Psychosis Hallucinations: None noted Delusions: None noted  Mental Status Report Appearance/Hygiene: In hospital gown Eye Contact: Poor Motor Activity: Freedom of movement Speech: Logical/coherent Level of Consciousness: Alert Mood: Depressed Affect: Depressed Anxiety Level: None Thought  Processes: Coherent, Relevant Judgement: Unimpaired Orientation: Person, Place, Time, Situation Obsessive Compulsive Thoughts/Behaviors: None  Cognitive Functioning Concentration: Fair Memory: Recent Intact, Remote Intact IQ: Average Insight: Poor Impulse Control: Fair Appetite: Poor Weight Loss: 0 Weight Gain: 0 Sleep: Decreased Total Hours of Sleep: 4 Vegetative Symptoms: None  ADLScreening [facility] Assessment Services) Patient's cognitive ability adequate to safely complete daily activities?: Yes Patient able to express need for assistance with ADLs?: Yes Independently performs ADLs?: Yes (appropriate for developmental age)  Prior Inpatient Therapy Prior Inpatient Therapy: Yes Prior Therapy Dates: multiple Prior Therapy Facilty/Provider(s): [facility] Reason for Treatment: SA/SI  Prior Outpatient Therapy Prior Outpatient Therapy: No Does patient have an ACCT team?: No Does patient have Intensive In-House Services?  : No Does patient have [facility] services? : No Does patient have P4CC services?: No  ADL Screening (condition at time of admission) Patient's cognitive ability adequate to safely complete daily activities?: Yes Patient able to express need for assistance with ADLs?: Yes Independently performs ADLs?: Yes (appropriate for developmental age)       Abuse/Neglect Assessment (Assessment to be complete while patient is alone) Physical Abuse: Denies Verbal Abuse: Denies Sexual Abuse: Denies Exploitation of patient/patient's resources: Denies Self-Neglect: Denies Values / Beliefs Cultural Requests During Hospitalization: None Spiritual Requests During Hospitalization: None Consults Spiritual Care Consult Needed: No Social Work Consult Needed: No Merchant navy officer (For Healthcare) Does patient have an advance directive?: No Would patient like information on creating an advanced directive?: No - patient declined information    Additional Information 1:1 In Past  12 Months?:  No CIRT Risk: No Elopement Risk: No Does patient have medical clearance?: Yes     Disposition:  Disposition Initial Assessment Completed for this Encounter: Yes Disposition of Patient: Inpatient treatment program Type of inpatient treatment program: Adult (OBS Unit)  On Site Evaluation by:   Reviewed with Physician:    Maryelizabeth Rowan A 06/06/2015 8:40 AM

## 2015-06-06 NOTE — ED Notes (Signed)
Pt has a blue book bag. Pt has 2 belonging bags with a pair of black adiddas, a pair of black socks, a black hoodie, a pair of blue jeans with a key chain attached,  2 white t-shirts, a plaid shirt and a tan vest.

## 2015-06-06 NOTE — BH Assessment (Signed)
BHH Assessment Progress Note  Per Carolanne Grumbling, MD, this pt would benefit from admission to the [facility] Observation Unit at this time.  Berneice Heinrich, RN, [facility] has assigned pt to Obs 5.  Pt has signed Voluntary Admission and Consent for Treatment, as well as Consent to Release Information to his wife, and signed forms have been faxed to [facility].  Pt's nurse, Carlisle Beers, has been notified, and agrees to send original paperwork along with pt via Juel Burrow, and to call report to [phone] or [phone].  Doylene Canning, MA Triage Specialist [phone]

## 2015-06-06 NOTE — Progress Notes (Signed)
D:Patient  appears flat and depressed.  Patient states  He does not know why he continues to have suicidal thoughts.  Patient states he was on the Adult unit last week and once discharged he did not f/u outpatient resources and began using crack cocaine again.  Patient states he has a wife who is supportive.  Patient states his plan is to go to a 14 day rehab program.  Patient denies SI/HI and denies AVH.  Patient has been pleasant and cooperative. A: Staff to monitor Q 15 mins for safety.  Encouragement and support offered.  No scheduled medications administered per orders.  Patient refused Trazodone.   R: Patient remains safe on the unit.

## 2015-06-06 NOTE — ED Notes (Signed)
Urine cup given

## 2015-06-06 NOTE — H&P (Signed)
[name] Admission Assessment Adult  Patient Identification: James [name] MRN:  [id number] Date of Evaluation:  06/06/2015 Chief Complaint:  Patient states "I feel hopeless about my cocaine use."  Principal Diagnosis: Cocaine-induced mood disorder ([place]) Diagnosis:   Patient Active Problem List   Diagnosis Date Noted  . Cocaine-induced mood disorder ([place]) [F14.94] 06/06/2015  . 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 Substance-induced psychotic disorder with onset during intoxication with hallucinations ([place]) [F19.251] 05/29/2015  . Major depressive disorder, recurrent episode ([place]) [F33.9] 05/28/2015  . Suicidal ideation [R45.851]   . Alcohol withdrawal ([place]) [F10.239] 12/07/2012  . Cocaine dependence ([place]) [F14.20] 03/01/2012  . Alcohol abuse [F10.10] 03/01/2012  . Major depression ([place]) [F32.9] 03/01/2012  . 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 MUSCLE SPASM, BACK [M53.80] 02/12/2009  . 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 HYPERCHOLESTEROLEMIA [E78.00] 05/09/2007  . SINUSITIS, CHRONIC [J32.9] 05/01/2007  . ALLERGIC RHINITIS [J30.9] 12/21/2006  . DIABETES MELLITUS, TYPE II [E11.9] 12/07/2006  . HYPERTENSION [I10] 12/07/2006   History of Present Illness::   James [name] is a 55 year old male with a history of cocaine dependence who was brought into the [facility] by police for suicidal ideations. The patient was recently discharged from [facility] by Dr. Sabra Heck last week. He reports being on his way to seek help with substance abuse at [facility] but instead ran into a friend "who had some crack in the car and I used it before I knew what happened." The patient reports becoming more depressed having thoughts to jump off a bridge that he was walking over stating "I looked at the traffic. I have let down my wife and kids so many times. I have been battling this for so  many years. I am tired and just wanted to give up." James [name] reports being very depressed about his relapse on cocaine. Patient continues to report passive suicidal ideation and does not feel able to contract for safety. He has expressed interest in his options for substance abuse treatment requesting admission at a facility. The patient reports numerous symptoms of depression which is consistent with his presentation today. Danil denies any recent use of alcohol. His current urine drug screen is positive for cocaine. Patient is denying any psychotic symptoms presently and does not appear to be experiencing such symptoms.   Associated Signs/Symptoms: Depression Symptoms:  depressed mood, anhedonia, fatigue, feelings of worthlessness/guilt, hopelessness, recurrent thoughts of death, suicidal thoughts with specific plan, anxiety, loss of energy/fatigue, disturbed sleep, (Hypo) Manic Symptoms:  Labiality of Mood, Anxiety Symptoms:  Excessive Worry, Panic Symptoms, Psychotic Symptoms:  Denies PTSD Symptoms: Negative Total Time spent with patient: 45 minutes  Past Psychiatric History:   Is the patient at risk to self? Yes.    Has the patient been a risk to self in the past 6 months? No.  Has the patient been a risk to self within the distant past? No.  Is the patient a risk to others? No.  Has the patient been a risk to others in the past 6 months? No.  Has the patient been a risk to others within the distant past? No.   Prior Inpatient Therapy:  [place] (7 years ago) 120 days 5 years clean relapsed after his father died and had anniversary of his death went to [facility] Prior Outpatient Therapy:  Not currently  Alcohol Screening: 1. How often do you have a drink containing alcohol?: 2  to 3 times a week 2. How many drinks containing alcohol do you have on a typical day when you are drinking?: 3 or 4 3. How often do you have six or more drinks on one occasion?:  Weekly Preliminary Score: 4 4. How often during the last year have you found that you were not able to stop drinking once you had started?: Daily or almost daily 5. How often during the last year have you failed to do what was normally expected from you becasue of drinking?: Weekly 6. How often during the last year have you needed a first drink in the morning to get yourself going after a heavy drinking session?: Never 7. How often during the last year have you had a feeling of guilt of remorse after drinking?: Daily or almost daily 8. How often during the last year have you been unable to remember what happened the night before because you had been drinking?: Daily or almost daily 9. Have you or someone else been injured as a result of your drinking?: No 10. Has a relative or friend or a doctor or another health worker been concerned about your drinking or suggested you cut down?: Yes, during the last year Alcohol Use Disorder Identification Test Final Score (AUDIT): 26 Brief Intervention: Yes Substance Abuse History in the last 12 months:  Yes.   Consequences of Substance Abuse: Legal Consequences:  possesion before cocaine and now small amount of pot Previous Psychotropic Medications: Yes  Psychological Evaluations: No  Past Medical History:  Past Medical History  Diagnosis Date  . Diabetes mellitus without complication [facility])     Past Surgical History  Procedure Laterality Date  . Finger surgery     Family History:  Family History  Problem Relation Age of Onset  . Diabetes Other   . Hypertension Other    Family Psychiatric  History: Brother heroin addict in recovery  Tobacco Screening: @FLOW [phone])::1)@ Social History:  History  Alcohol Use  . Yes     History  Drug Use  . 3.00 per week  . 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 Has been working Financial trader Social History:                           Allergies:  No Known Allergies Lab Results:  Results for orders placed or performed during the hospital encounter of 06/06/15 (from the past 48 hour(s))  Comprehensive metabolic panel     Status: Abnormal   Collection Time: 06/06/15  6:46 AM  Result Value Ref Range   Sodium 133 (L) 135 - 145 mmol/L   Potassium 4.7 3.5 - 5.1 mmol/L   Chloride 96 (L) 101 - 111 mmol/L   CO2 27 22 - 32 mmol/L   Glucose, Bld 339 (H) 65 - 99 mg/dL   BUN 11 6 - 20 mg/dL   Creatinine, Ser 0.91 0.61 - 1.24 mg/dL   Calcium 8.9 8.9 - 10.3 mg/dL   Total Protein 6.7 6.5 - 8.1 g/dL   Albumin 3.9 3.5 - 5.0 g/dL   AST 38 15 - 41 U/L   ALT 61 17 - 63 U/L   Alkaline Phosphatase 66 38 - 126 U/L   Total Bilirubin 0.6 0.3 - 1.2 mg/dL  GFR calc non Af Amer >60 >60 mL/min   GFR calc Af Amer >60 >60 mL/min    Comment: (NOTE) The eGFR has been calculated using the CKD EPI equation. This calculation has not been validated in all clinical situations. eGFR's persistently <60 mL/min signify possible Chronic Kidney Disease.    Anion gap 10 5 - 15  Ethanol (ETOH)     Status: None   Collection Time: 06/06/15  6:46 AM  Result Value Ref Range   Alcohol, Ethyl (B) <5 <5 mg/dL    Comment:        LOWEST DETECTABLE LIMIT FOR SERUM ALCOHOL IS 5 mg/dL FOR MEDICAL PURPOSES ONLY   Salicylate level     Status: None   Collection Time: 06/06/15  6:46 AM  Result Value Ref Range   Salicylate Lvl <0.9 2.8 - 30.0 mg/dL  Acetaminophen level     Status: Abnormal   Collection Time: 06/06/15  6:46 AM  Result Value Ref Range   Acetaminophen (Tylenol), Serum <10 (L) 10 - 30 ug/mL    Comment:        THERAPEUTIC CONCENTRATIONS VARY SIGNIFICANTLY. A RANGE OF 10-30 ug/mL MAY BE AN EFFECTIVE CONCENTRATION FOR MANY PATIENTS. HOWEVER, SOME ARE BEST TREATED AT CONCENTRATIONS OUTSIDE THIS RANGE. ACETAMINOPHEN  CONCENTRATIONS >150 ug/mL AT 4 HOURS AFTER INGESTION AND >50 ug/mL AT 12 HOURS AFTER INGESTION ARE OFTEN ASSOCIATED WITH TOXIC REACTIONS.   CBG monitoring, ED     Status: Abnormal   Collection Time: 06/06/15  6:59 AM  Result Value Ref Range   Glucose-Capillary 304 (H) 65 - 99 mg/dL  CBC     Status: None   Collection Time: 06/06/15  8:06 AM  Result Value Ref Range   WBC 4.8 4.0 - 10.5 K/uL   RBC 4.98 4.22 - 5.81 MIL/uL   Hemoglobin 14.5 13.0 - 17.0 g/dL   HCT 43.6 39.0 - 52.0 %   MCV 87.6 78.0 - 100.0 fL   MCH 29.1 26.0 - 34.0 pg   MCHC 33.3 30.0 - 36.0 g/dL   RDW 14.3 11.5 - 15.5 %   Platelets 272 150 - 400 K/uL  CBG monitoring, ED     Status: Abnormal   Collection Time: 06/06/15 11:35 AM  Result Value Ref Range   Glucose-Capillary 209 (H) 65 - 99 mg/dL  Urine rapid drug screen (hosp performed) (Not at [facility])     Status: Abnormal   Collection Time: 06/06/15 11:37 AM  Result Value Ref Range   Opiates NONE DETECTED NONE DETECTED   Cocaine POSITIVE (A) NONE DETECTED   Benzodiazepines NONE DETECTED NONE DETECTED   Amphetamines NONE DETECTED NONE DETECTED   Tetrahydrocannabinol NONE DETECTED NONE DETECTED   Barbiturates NONE DETECTED NONE DETECTED    Comment:        DRUG SCREEN FOR MEDICAL PURPOSES ONLY.  IF CONFIRMATION IS NEEDED FOR ANY PURPOSE, NOTIFY LAB WITHIN 5 DAYS.        LOWEST DETECTABLE LIMITS FOR URINE DRUG SCREEN Drug Class       Cutoff (ng/mL) Amphetamine      1000 Barbiturate      200 Benzodiazepine   643 Tricyclics       838 Opiates          300 Cocaine          300 THC              50     Blood Alcohol level:  Lab Results  Component Value Date  ETH <5 06/06/2015   ETH <5 44/31/5400    Metabolic Disorder Labs:  Lab Results  Component Value Date   HGBA1C 14.3* 05/29/2015   MPG 364 05/29/2015   MPG 272* 12/07/2012   No results found for: PROLACTIN Lab Results  Component Value Date   CHOL 173 08/16/2008   TRIG 34 08/16/2008   HDL 76  08/16/2008   CHOLHDL 2.3 Ratio 08/16/2008   VLDL 7 08/16/2008   LDLCALC 90 08/16/2008   LDLCALC 98 12/20/2007    Current Medications: Current Facility-Administered Medications  Medication Dose Route Frequency Provider Last Rate Last Dose  . acetaminophen (TYLENOL) tablet 650 mg  650 mg Oral Q6H PRN Patrecia Pour, NP      . alum & mag hydroxide-simeth (MAALOX/MYLANTA) 200-200-20 MG/5ML suspension 30 mL  30 mL Oral Q4H PRN Patrecia Pour, NP      . hydrOXYzine (ATARAX/VISTARIL) tablet 25 mg  25 mg Oral Q6H PRN Patrecia Pour, NP      . insulin aspart protamine- aspart (NOVOLOG MIX 70/30) injection 10 Units  10 Units Subcutaneous Q supper Patrecia Pour, NP      . magnesium hydroxide (MILK OF MAGNESIA) suspension 30 mL  30 mL Oral Daily PRN Patrecia Pour, NP      . metFORMIN (GLUCOPHAGE) tablet 1,000 mg  1,000 mg Oral BID WC Patrecia Pour, NP      . Derrill Memo ON 06/07/2015] nicotine (NICODERM CQ - dosed in mg/24 hours) patch 21 mg  21 mg Transdermal Q0600 Patrecia Pour, NP      . traZODone (DESYREL) tablet 50 mg  50 mg Oral QHS,MR X 1 Patrecia Pour, NP       PTA Medications: Prescriptions prior to admission  Medication Sig Dispense Refill Last Dose  . hydrOXYzine (ATARAX/VISTARIL) 25 MG tablet Take 1 tablet (25 mg total) by mouth every 6 (six) hours as needed (sleep). & anxiety 60 tablet 0 Past Week at Unknown time  . insulin aspart protamine- aspart (NOVOLOG MIX 70/30) (70-30) 100 UNIT/ML injection Inject 0.1 mLs (10 Units total) into the skin daily with supper. For diabetes control 10 mL 1 Past Week at Unknown time  . meloxicam (MOBIC) 15 MG tablet Take 1 tablet (15 mg total) by mouth daily. For arthritic pain (Patient taking differently: Take 15 mg by mouth daily as needed for pain. For arthritic pain) 30 tablet 1 Past Week at Unknown time  . metFORMIN (GLUCOPHAGE) 1000 MG tablet Take 1 tablet (1,000 mg total) by mouth 2 (two) times daily with a meal. For diabetes management 60 tablet 1  Past Week at Unknown time  . nicotine (NICODERM CQ - DOSED IN MG/24 HOURS) 21 mg/24hr patch Place 1 patch (21 mg total) onto the skin daily at 6 (six) AM. For smoking cessation (Patient not taking: Reported on 06/06/2015) 28 patch 0 Not Taking at Unknown time  . traZODone (DESYREL) 50 MG tablet Take 1 tablet (50 mg total) by mouth at bedtime and may repeat dose one time if needed. For sleep 60 tablet 0 Past Week at Unknown time    Musculoskeletal: Strength & Muscle Tone: within normal limits Gait & Station: normal Patient leans: normal  Psychiatric Specialty Exam: Physical Exam  Review of Systems  Constitutional: Negative.   HENT: Negative.   Eyes: Negative.   Respiratory:       Pack every other day  Cardiovascular: Negative.   Gastrointestinal: Negative.   Genitourinary: Negative.   Musculoskeletal: Negative.  Skin: Negative.   Neurological: Negative.   Endo/Heme/Allergies: Negative.   Psychiatric/Behavioral: Positive for depression, suicidal ideas and substance abuse. Negative for hallucinations and memory loss. The patient is nervous/anxious. The patient does not have insomnia.     Blood pressure 119/75, pulse 97, temperature 98.4 F (36.9 C), temperature source Oral, height 6' (1.829 m), weight 64.864 kg (143 lb), SpO2 100 %.Body mass index is 19.39 kg/(m^2).  General Appearance: normal  Eye Contact::  Fair  Speech:  Clear and Coherent and Pressured  Volume:  fluctuates  Mood:  Anxious, Depressed, Dysphoric and worried  Affect:  Anxious, Depressed, Dysphoric and worried  Thought Process:  Coherent and Goal Directed at times circumstantial  Orientation:  Full (Time, Place, and Person)  Thought Content:  symptoms events worries concerns  Suicidal Thoughts:  No  Homicidal Thoughts:  No  Memory:  Immediate;   Fair Recent;   Fair Remote;   Fair  Judgement:  Fair  Insight:  Present and Shallow  Psychomotor Activity:  Restlessness  Concentration:  Fair  Recall:  [employer] of Knowledge:Fair  Language: Fair  Akathisia:  No  Handed:  Right  AIMS (if indicated):     Assets:  Desire for Improvement Housing Social Support  ADL's:  Intact  Cognition: WNL  Sleep:        Treatment Plan Summary: Daily contact with patient to assess and evaluate symptoms and progress in treatment and Medication management Supportive approach/coping skills Cocaine dependence; work a relapse prevention plan Depression; reassess for the need of antidepressants Work with CBT/mindfulness Explore need for a residential treatment program Observation Level/Precautions:  Continuous Observation  Laboratory:  As per the ED, CBG monitoring for history of diabetes   Psychotherapy:  Individual/group  Medications:  Will monitor S/S of withdrawal and address accordingly  Consultations:  None at this time   Discharge Concerns:  Need for rehab  Estimated LOS: Less than 48 hours  Other:      Elmarie Shiley, NP 2/24/20174:54 PM

## 2015-06-06 NOTE — ED Notes (Signed)
Pt has been wanded by security. 

## 2015-06-06 NOTE — ED Notes (Signed)
Pt brought in by police for suicidal ideations  Pt states he has been having suicidal ideations and has been using crack cocaine  Pt states he has had a hard time stopping and staying off of it  Pt states he is just tired of everything  Pt states today he was walking on an overpass and thought about jumping  Pt states he has been struggling with this for a long time   Pt is here voluntarily  Pt states he was seen here for same last week

## 2015-06-06 NOTE — Progress Notes (Signed)
Nursing Admission Note:  Patient admitted from [facility] voluntary with diagnosis of substance induced mood disorder. Patient states he was "just discharged from here last Thursday from the inpatient unit for detox from cocaine which is his substance of choice. Patient does admit to alcohol and tobacco use but states those have never been a problem for him, "they're not my thing". Patient stating he currently lives with his wife and grown daughter, and "I'm not sure after this" about whether he will be able to return home at discharge. Patient stating he actually prefers to be discharged to a 10 day or 2 week inpatient program, further stating "I don't think 3 days is enough for me". Patient is alert, oriented, eating a large lunch in Search Room, denies any active SI and does verbally contract for safety in that he agrees to alert staff first should he develop any thoughts of self harm. Patient denies HI and AVH but does admit to feeling hopeless and helpless and "stupid" for already being readmitted for the same thing that he just was discharged for last week at [facility]. Patient states his longest length of sobriety was 5 years, from 2007 to 2012 but relapsed and has been a regular cocaine abuser since relapse.

## 2015-06-06 NOTE — ED Notes (Addendum)
Patient has three bags of belongings in locker 26.

## 2015-06-06 NOTE — ED Provider Notes (Signed)
CSN: [id number]     Arrival date & time 06/06/15  0544 History   First MD Initiated Contact with Patient 06/06/15 [phone]     Chief Complaint  Patient presents with  . Suicidal     (Consider location/radiation/quality/duration/timing/severity/associated sxs/prior Treatment) HPI   Patient is a 55 year old male past medical history diabetes and cocaine abuse who presents the ED with complaint of suicide ideation. Patient states he has been feeling more depressed over the past 2 days. He endorses having SI with a plan of jumping off an overpass. He reports using crack cocaine and notes he last used last night. Endorses drinking occasionally and notes last night he had 7-8 beers. Pt denies any pain or complaints at this time. Denies HI or visual/auditory hallucinations. Pt notes he was recently d/c from [facility] on 2/15.  Past Medical History  Diagnosis Date  . Diabetes mellitus without complication [facility])    Past Surgical History  Procedure Laterality Date  . Finger surgery     Family History  Problem Relation Age of Onset  . Diabetes Other   . Hypertension Other    Social History  Substance Use Topics  . Smoking status: Current Every Day Smoker -- 0.50 packs/day for 23 years    Types: Cigarettes  . Smokeless tobacco: Never Used  . Alcohol Use: Yes    Review of Systems  Psychiatric/Behavioral: Positive for suicidal ideas.       Depressed  All other systems reviewed and are negative.     Allergies  Review of patient's allergies indicates no known allergies.  Home Medications   Prior to Admission medications   Medication Sig Start Date End Date Taking? Authorizing Provider  hydrOXYzine (ATARAX/VISTARIL) 25 MG tablet Take 1 tablet (25 mg total) by mouth every 6 (six) hours as needed (sleep). & anxiety 05/30/15  Yes Sanjuana Kava, NP  insulin aspart protamine- aspart (NOVOLOG MIX 70/30) (70-30) 100 UNIT/ML injection Inject 0.1 mLs (10 Units total) into the skin daily with supper.  For diabetes control 05/30/15  Yes Sanjuana Kava, NP  meloxicam (MOBIC) 15 MG tablet Take 1 tablet (15 mg total) by mouth daily. For arthritic pain Patient taking differently: Take 15 mg by mouth daily as needed for pain. For arthritic pain 05/30/15  Yes Sanjuana Kava, NP  metFORMIN (GLUCOPHAGE) 1000 MG tablet Take 1 tablet (1,000 mg total) by mouth 2 (two) times daily with a meal. For diabetes management 05/30/15  Yes Sanjuana Kava, NP  traZODone (DESYREL) 50 MG tablet Take 1 tablet (50 mg total) by mouth at bedtime and may repeat dose one time if needed. For sleep 05/30/15  Yes Sanjuana Kava, NP  nicotine (NICODERM CQ - DOSED IN MG/24 HOURS) 21 mg/24hr patch Place 1 patch (21 mg total) onto the skin daily at 6 (six) AM. For smoking cessation Patient not taking: Reported on 06/06/2015 05/30/15   Sanjuana Kava, NP   BP 127/81 mmHg  Pulse 93  Temp(Src) 98.3 F (36.8 C) (Oral)  Resp 18  Ht 6' (1.829 m)  Wt 62.596 kg  BMI 18.71 kg/m2  SpO2 96% Physical Exam  Constitutional: He is oriented to person, place, and time. He appears well-developed and well-nourished.  HENT:  Head: Normocephalic and atraumatic.  Mouth/Throat: Oropharynx is clear and moist. No oropharyngeal exudate.  Eyes: Conjunctivae and EOM are normal. Pupils are equal, round, and reactive to light. Right eye exhibits no discharge. Left eye exhibits no discharge. No scleral icterus.  Neck:  Normal range of motion. Neck supple.  Cardiovascular: Normal rate, regular rhythm, normal heart sounds and intact distal pulses.   Pulmonary/Chest: Effort normal and breath sounds normal. No respiratory distress. He has no wheezes. He has no rales. He exhibits no tenderness.  Abdominal: Soft. Bowel sounds are normal. He exhibits no distension and no mass. There is no tenderness. There is no rebound and no guarding.  Musculoskeletal: Normal range of motion. He exhibits no edema.  Lymphadenopathy:    He has no cervical adenopathy.  Neurological: He  is alert and oriented to person, place, and time.  Skin: Skin is warm and dry.  Psychiatric: His speech is normal. He is slowed. Cognition and memory are normal. He exhibits a depressed mood. He expresses suicidal ideation. He expresses suicidal plans.  Nursing note and vitals reviewed.   ED Course  Procedures (including critical care time) Labs Review Labs Reviewed  COMPREHENSIVE METABOLIC PANEL - Abnormal; Notable for the following:    Sodium 133 (*)    Chloride 96 (*)    Glucose, Bld 339 (*)    All other components within normal limits  ACETAMINOPHEN LEVEL - Abnormal; Notable for the following:    Acetaminophen (Tylenol), Serum <10 (*)    All other components within normal limits  URINE RAPID DRUG SCREEN, HOSP PERFORMED - Abnormal; Notable for the following:    Cocaine POSITIVE (*)    All other components within normal limits  CBG MONITORING, ED - Abnormal; Notable for the following:    Glucose-Capillary 304 (*)    All other components within normal limits  CBG MONITORING, ED - Abnormal; Notable for the following:    Glucose-Capillary 209 (*)    All other components within normal limits  ETHANOL  SALICYLATE LEVEL  CBC    Imaging Review No results found. I have personally reviewed and evaluated these images and lab results as part of my medical decision-making.   EKG Interpretation None      MDM   Final diagnoses:  Cocaine-induced mood disorder (HCC)  Cocaine dependence without complication (H[facility])    Patient presents with suicidal ideation with plan. History of cocaine abuse. History of diabetes. Glucose 304, no anion gap. Patient given 4 units of NovoLog in the ED. Patient medically cleared. Consulted TTS for consultation and placement. Behavioral Health recommends observation for inpatient treatment.    Satira Sark [place], [place] 06/06/15 1610  Raeford Razor, MD 06/12/15 [phone]

## 2015-06-06 NOTE — BH Assessment (Signed)
[facility] Assessment Progress Note   Patient was seen by Earlene Plater NP and Porfirio Oar on admission. Patient stated he has relapsed on cocaine reporting daily use of up to 2 grams of cocaine (crack) with last use on 06/05/15. Patient is interested in long term treatment with this Clinical research associate assisting patient in investigating treatment options. Resources will be provided on discharge.

## 2015-06-07 LAB — GLUCOSE, CAPILLARY
Glucose-Capillary: 282 mg/dL — ABNORMAL HIGH (ref 65–99)
Glucose-Capillary: 293 mg/dL — ABNORMAL HIGH (ref 65–99)

## 2015-06-07 MED ORDER — PNEUMOCOCCAL VAC POLYVALENT 25 MCG/0.5ML IJ INJ: 0.5000 mL | INJECTION | INTRAMUSCULAR | Status: DC

## 2015-06-07 MED ORDER — INFLUENZA VAC SPLIT QUAD 0.5 ML IM SUSY
0.5000 mL | PREFILLED_SYRINGE | INTRAMUSCULAR | Status: DC
  Filled 2015-06-07: qty 0.5

## 2015-06-07 MED ORDER — BUPROPION HCL ER (XL) 150 MG PO TB24
150.0000 mg | ORAL_TABLET | Freq: Every day | ORAL | Status: DC
  Administered 2015-06-07: 150 mg via ORAL
  Filled 2015-06-07: qty 1

## 2015-06-07 MED ORDER — NICOTINE 21 MG/24HR TD PT24: 21.0000 mg | MEDICATED_PATCH | Freq: Every day | TRANSDERMAL | Status: DC

## 2015-06-07 MED ORDER — BUPROPION HCL ER (XL) 150 MG PO TB24: 150.0000 mg | ORAL_TABLET | Freq: Every day | ORAL | Status: DC

## 2015-06-07 NOTE — BH Assessment (Addendum)
[facility] Assessment Progress Note  Patient was seen this date by Porfirio Oar and Earlene Plater NP to re-evaluate. Patient stated they continued to have some thoughts of self harm but no plan stating he was "feeling some better" but was not sure if he was ready to be discharged. Patient was open to medication interventions with Earlene Plater NP agreeing to start a medication trial with patient. Patient reports ongoing depression and feels that contributes to his problems with addiction. This Clinical research associate spoke with patient at length about treatment options available on discharge with patient agreeing to follow up with [facility] at [facility]. This Clinical research associate also encouraged patient to continue with his medication regimen and educated on the importance of medication adherence. Patient will be given referral information on discharge. Patient did state that if he could contact his wife this date and return home he may wish to be discharged later this date. Patient stated he need to "think about everything." Patient will be seen later this date to determine disposition and discuss treatment options.

## 2015-06-07 NOTE — Progress Notes (Signed)
Nursing Shift Assessment:  Patient denies any active SI/HI/AVH and states he feels " a little better" than yesterday regarding his depression and hopelessness. Patient contracts for safety and still states he would like to go to a one to two week rehab facility. Nurse providing medications as ordered, emotional support, therapeutic communication and continuous observation for safety except when patient in the bathroom. NP in to see patient and patient in agreement to be discharged back to home today. Is telling Nurse he is okay with that and that his wife is okay with it as well. Patient remains safe on uint, is laying down in bed, eyes closed, respirations unlabored. James [name]

## 2015-06-07 NOTE — Progress Notes (Signed)
Nurses Discharge Note:  Patient has received all belongings and signed for them as well as signing the Discharge Summary and also given a copy of same. Patient continues to deny any SI/HI/AVH and states understanding of  followup appointments and medications prescribed. Patient escorted from Unit by staff member to [facility] for transport home via taxi.

## 2015-06-07 NOTE — Discharge Instructions (Signed)
This Clinical research associate spoke with patient at length about treatment options available on discharge with patient agreeing to follow up with [facility] at [facility]. This Clinical research associate also encouraged patient to continue with his medication regimen and educated on the importance of medication adherence.Patient will be given referral information on discharge.

## 2015-06-07 NOTE — Progress Notes (Signed)
D: Patient resting in bed with eyes closed.  Respirations even and unlabored.  Patient appears to be in no apparent distress. A: Staff to monitor Q 15 mins for safety.   R:Patient remains safe on the unit.  

## 2015-06-07 NOTE — Discharge Summary (Signed)
[facility]-Observation Unit Discharge Summary Note  Patient:  James [name] [name] is an 55 y.o., male MRN:  [id number] DOB:  [birth year] Patient phone:  [phone] (home)  Patient address:   [address] [place] [address],  Total Time spent with patient: Greater than 30 minutes  Date of Admission:  06/06/2015 Date of Discharge: 06/07/2015  Reason for Admission: Substance abuse/suicidal ideations  Principal Problem: Cocaine-induced mood disorder [facility])  Discharge Diagnoses: Patient Active Problem List   Diagnosis Date Noted  . Cocaine-induced mood disorder (HCC) [F14.94] 06/06/2015  . 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 Major depressive disorder, recurrent episode (HCC) [F33.9] 05/28/2015  . Suicidal ideation [R45.851]   . Alcohol withdrawal (HCC) [F10.239] 12/07/2012  . Cocaine dependence (HCC) [F14.20] 03/01/2012  . Alcohol abuse [F10.10] 03/01/2012  . Major depression (HCC) [F32.9] 03/01/2012  . 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 MUSCLE SPASM, BACK [M53.80] 02/12/2009  . 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 HYPERCHOLESTEROLEMIA [E78.00] 05/09/2007  . SINUSITIS, CHRONIC [J32.9] 05/01/2007  . ALLERGIC RHINITIS [J30.9] 12/21/2006  . DIABETES MELLITUS, TYPE II [E11.9] 12/07/2006  . HYPERTENSION [I10] 12/07/2006   Past Psychiatric History: Major depression, Cocaine dependence  Past Medical History:  Past Medical History  Diagnosis Date  . Diabetes mellitus without complication [facility])     Past Surgical History  Procedure Laterality Date  . Finger surgery     Family History:  Family History  Problem Relation Age of Onset  . Diabetes Other   . Hypertension Other     Family Psychiatric  History: See H&P  Social History:   History  Alcohol Use  . Yes     History  Drug Use  . 3.00 per week  . Special: Cocaine, "Crack" cocaine    Social History   Social History  . Marital Status: Legally Separated    Spouse Name: N/A  . Number of Children: N/A  . Years of Education: N/A   Social History Main Topics  . Smoking status: Current Every Day Smoker -- 0.50 packs/day for 23 years    Types: Cigarettes  . Smokeless tobacco: Never Used  . Alcohol Use: Yes  . Drug Use: 3.00 per week    Special: Cocaine, "Crack" cocaine  . Sexual Activity: Not Currently   Other Topics Concern  . None   Social History Narrative   Hospital Course:   James [name] [name] is a 55 year old male with a history of cocaine dependence who was brought into the [facility] by police for suicidal ideations. The patient was recently discharged from [facility] by Dr. Dub Mikes last week. He reports being on his way to seek help with substance abuse at [facility] but instead ran into a friend "who had some crack in the car and I used it before I knew what happened." The patient reports becoming more depressed having thoughts to jump off a bridge that he was walking over stating "I looked at the traffic. I have let down my wife and kids so many times. I have been battling this for so many years. I am tired and just wanted to give up." James [name] [name] reports being very depressed about his relapse on cocaine. Patient continues to report passive suicidal ideation and does not feel able to contract for safety. He has expressed interest in his options for substance abuse treatment requesting admission  at a facility. The patient reports numerous symptoms of depression which is consistent with his presentation today. James [name] [name] denies any recent use of alcohol. His current urine drug screen is positive for cocaine. Patient is denying any psychotic symptoms presently and does not appear to be experiencing such symptoms.   James [name] [name] was admitted to the [facility] unit for Suicidal ideations  related to his recent relapse on Cocaine use. He came to the hospital for help. He did not need detox treatment as cocaine has no established detox protocols. During his admission assessment, he was evaluated & his symptoms identified. Medication management was discussed and initiated targeting those presenting symptoms. He was oriented to the Observation unit & encouraged to engage in the individual therapy offered. His other presenting medical problems were identified, treated appropriately by re-resuming his pertinent home medications that he has not been on for a long time.  His home medications were restarted for continued treatment of his diabetic condition. He tolerated his treatment regimen without any significant adverse effects and or reactions.  During James [name] [name]'s hospital stay, he was evaluated daily by a clinical provider to ascertain his response to his treatment regimen. As the day goes by, improvement was noted by his reports of decreasing symptoms and medication tolerance. His symptoms responded well to his treatment regimen. Also, being in a therapeutic & supportive environment assisted in his mood stability. James [name] [name] did present appropriate behavior & was motivated for recovery. Patient talked at length with the Observation counselor James [name] [name] about techniques to remain abstinent from cocaine. Patient agreed that his suicidal thoughts are related to his cocaine use and feelings of helplessness. He also reported functioning better when taking an antidepressant such as Wellbutrin 150 mg XL which the patient has taken before. The importance of following up with his discharge plan was stressed as after his recent discharge from [facility] he did not attend the Intensive Outpatient program at [facility]. Patient reported that he would be able to return to his wife's residence. James [name] [name] expressed worry about his ability to abstain from cocaine use.   On this day of his hospital discharge, James [name] is in much  improved condition than upon admission. His symptoms were reported as significantly decreased or resolved completely. Upon discharge, he adamantly denies any SI/HI & voiced no AVH. He was motivated to continue taking medication with a goal of continued improvement in mental/medical health. He was discharged on Hydroxyzine 25 mg prn for anxiety, Wellbutrin XL 150 mg for depression, Nicotine patch 21 mg for smoking cessation & trazodone 50 mg for insomnia. He was also provided with prescriptions for all his medications for his medical condition with a referral & appointment to follow-up at the [facility] here in [place], [place].  James [name] [name] was discharged home with a plan to follow up as noted below.  He left [facility] with all personal belongings in no apparent distress. Transportation per wife.  Consults:  psychiatry  Significant Diagnostic Studies:  labs: CBC with diff, CMP, UDS, toxicology tests, U/A, results reviewed, stable  Physical Findings: AIMS: Facial and Oral Movements Muscles of Facial Expression: None, normal Lips and Perioral Area: None, normal Jaw: None, normal Tongue: None, normal,Extremity Movements Upper (arms, wrists, hands, fingers): None, normal Lower (legs, knees, ankles, toes): None, normal, Trunk Movements Neck, shoulders, hips: None, normal, Overall Severity Severity of abnormal movements (highest score from questions above): None, normal Incapacitation due to abnormal movements: None, normal Patient's awareness of abnormal movements (rate only patient's report): No Awareness, Dental Status  Current problems with teeth and/or dentures?: No Does patient usually wear dentures?: No  CIWA:  CIWA-Ar Total: 1 COWS:  COWS Total Score: 1  Musculoskeletal: Strength & Muscle Tone: within normal limits Gait & Station: normal Patient leans: N/A  Psychiatric Specialty Exam: Review of Systems  Constitutional: Negative.   HENT: Negative.   Eyes: Negative.    Respiratory: Negative.   Cardiovascular: Negative.   Gastrointestinal: Negative.   Genitourinary: Negative.   Musculoskeletal: Negative.   Skin: Negative.   Neurological: Negative.   Endo/Heme/Allergies: Negative.   Psychiatric/Behavioral: Positive for depression (Stable) and substance abuse (x. Cocaine dependence). Negative for suicidal ideas, hallucinations (Hx of) and memory loss. The patient has insomnia (Stable). The patient is not nervous/anxious.     Blood pressure 107/59, pulse 82, temperature 97.9 F (36.6 C), temperature source Oral, resp. rate 20, height 6' (1.829 m), weight 64.864 kg (143 lb), SpO2 98 %.Body mass index is 19.39 kg/(m^2).  See Md's SRA   Have you used any form of tobacco in the last 30 days? (Cigarettes, Smokeless Tobacco, Cigars, and/or Pipes): Yes  Has this patient used any form of tobacco in the last 30 days? (Cigarettes, Smokeless Tobacco, Cigars, and/or Pipes) Yes, Nicotine patch provided on admission.  Blood Alcohol level:  Lab Results  Component Value Date   ETH <5 06/06/2015   ETH <5 05/27/2015   Metabolic Disorder Labs:  Lab Results  Component Value Date   HGBA1C 14.3* 05/29/2015   MPG 364 05/29/2015   MPG 272* 12/07/2012   No results found for: PROLACTIN Lab Results  Component Value Date   CHOL 173 08/16/2008   TRIG 34 08/16/2008   HDL 76 08/16/2008   CHOLHDL 2.3 Ratio 08/16/2008   VLDL 7 08/16/2008   LDLCALC 90 08/16/2008   LDLCALC 98 12/20/2007   See Psychiatric Specialty Exam and Suicide Risk Assessment completed by Attending Physician prior to discharge.  Discharge destination:  Home  Is patient on multiple antipsychotic therapies at discharge:  No   Has Patient had three or more failed trials of antipsychotic monotherapy by history:  No  Recommended Plan for Multiple Antipsychotic Therapies: NA    Medication List    TAKE these medications      Indication   buPROPion 150 MG 24 hr tablet  Commonly known as:   WELLBUTRIN XL  Take 1 tablet (150 mg total) by mouth daily.   Indication:  Cocaine induced mood disorder     hydrOXYzine 25 MG tablet  Commonly known as:  ATARAX/VISTARIL  Take 1 tablet (25 mg total) by mouth every 6 (six) hours as needed (sleep). & anxiety   Indication:  Anxiety/insomnia     insulin aspart protamine- aspart (70-30) 100 UNIT/ML injection  Commonly known as:  NOVOLOG MIX 70/30  Inject 0.1 mLs (10 Units total) into the skin daily with supper. For diabetes control   Indication:  Type 2 Diabetes     meloxicam 15 MG tablet  Commonly known as:  MOBIC  Take 1 tablet (15 mg total) by mouth daily. For arthritic pain   Indication:  Joint Damage causing Pain and Loss of Function     metFORMIN 1000 MG tablet  Commonly known as:  GLUCOPHAGE  Take 1 tablet (1,000 mg total) by mouth 2 (two) times daily with a meal. For diabetes management   Indication:  Type 2 Diabetes     nicotine 21 mg/24hr patch  Commonly known as:  NICODERM CQ - dosed in mg/24 hours  Place  1 patch (21 mg total) onto the skin daily at 6 (six) AM. For smoking cessation   Indication:  Nicotine Addiction     traZODone 50 MG tablet  Commonly known as:  DESYREL  Take 1 tablet (50 mg total) by mouth at bedtime and may repeat dose one time if needed. For sleep   Indication:  Trouble Sleeping       Follow-up recommendations:  Activity:  As tolerated Diet: As recommended by your primary care doctor. Keep all scheduled follow-up appointments as recommended.  Comments: Take all your medications as prescribed by your mental healthcare provider. Report any adverse effects and or reactions from your medicines to your outpatient provider promptly. Patient is instructed and cautioned to not engage in alcohol and or illegal drug use while on prescription medicines. In the event of worsening symptoms, patient is instructed to call the crisis hotline, 911 and or go to the nearest ED for appropriate evaluation and  treatment of symptoms. Follow-up with your primary care provider for your other medical issues, concerns and or health care needs.   SignedFransisca Kaufmann, NP, NP-C 06/07/2015, 4:39 PM

## 2015-06-07 NOTE — BH Assessment (Signed)
[facility] Assessment Progress Note This Clinical research associate spoke to patient this date with patient stating after thinking about his options and contacting his wife, would like to be discharged. Patient stated he doesn't have any thoughts of self harm and can contact for safety. This writer spoke at length to patient about the principles associated with the recovery model with patient stating he felt more optimistic about his efforts to maintain his sobriety once discharged. This Clinical research associate spoke with Earlene Plater NP who agreed patient could be discharged since he no longer had any thoughts of self harm. Patient was given information on [facility] and this writer started a relapse prevention plan with patient. Patient stated they would follow up with aftercare and address his recovery issues by attending a Intensive Outpatient Program at [facility]. Patient was given information and provided with outpatient resources and will return to his residence this date.

## 2015-06-25 ENCOUNTER — Encounter (HOSPITAL_COMMUNITY): Payer: Self-pay

## 2015-06-25 ENCOUNTER — Encounter (HOSPITAL_COMMUNITY): Payer: Self-pay | Admitting: Emergency Medicine

## 2015-06-25 ENCOUNTER — Emergency Department (HOSPITAL_COMMUNITY)
Admission: EM | Admit: 2015-06-25 | Discharge: 2015-06-25 | Disposition: A | Discharge status: Other Acute Inpatient Hospital | Payer: PRIVATE HEALTH INSURANCE | Attending: Emergency Medicine | Admitting: Emergency Medicine

## 2015-06-25 ENCOUNTER — Observation Stay (HOSPITAL_COMMUNITY)
Admission: AD | Admit: 2015-06-25 | Discharge: 2015-06-26 | Payer: Self-pay | Source: Intra-hospital | Attending: Psychiatry | Admitting: Psychiatry

## 2015-06-25 DIAGNOSIS — F1910 Other psychoactive substance abuse, uncomplicated: Secondary | ICD-10-CM

## 2015-06-25 DIAGNOSIS — Z590 Homelessness: Secondary | ICD-10-CM | POA: Insufficient documentation

## 2015-06-25 DIAGNOSIS — F1010 Alcohol abuse, uncomplicated: Secondary | ICD-10-CM | POA: Insufficient documentation

## 2015-06-25 DIAGNOSIS — Z7984 Long term (current) use of oral hypoglycemic drugs: Secondary | ICD-10-CM | POA: Insufficient documentation

## 2015-06-25 DIAGNOSIS — Z9114 Patient's other noncompliance with medication regimen: Secondary | ICD-10-CM | POA: Insufficient documentation

## 2015-06-25 DIAGNOSIS — T50902A Poisoning by unspecified drugs, medicaments and biological substances, intentional self-harm, initial encounter: Secondary | ICD-10-CM | POA: Insufficient documentation

## 2015-06-25 DIAGNOSIS — F332 Major depressive disorder, recurrent severe without psychotic features: Secondary | ICD-10-CM | POA: Diagnosis present

## 2015-06-25 DIAGNOSIS — Z791 Long term (current) use of non-steroidal anti-inflammatories (NSAID): Secondary | ICD-10-CM | POA: Insufficient documentation

## 2015-06-25 DIAGNOSIS — Z79899 Other long term (current) drug therapy: Secondary | ICD-10-CM | POA: Insufficient documentation

## 2015-06-25 DIAGNOSIS — F1020 Alcohol dependence, uncomplicated: Secondary | ICD-10-CM | POA: Diagnosis present

## 2015-06-25 DIAGNOSIS — F1494 Cocaine use, unspecified with cocaine-induced mood disorder: Secondary | ICD-10-CM | POA: Diagnosis present

## 2015-06-25 DIAGNOSIS — F1994 Other psychoactive substance use, unspecified with psychoactive substance-induced mood disorder: Secondary | ICD-10-CM | POA: Diagnosis present

## 2015-06-25 DIAGNOSIS — Z794 Long term (current) use of insulin: Secondary | ICD-10-CM | POA: Insufficient documentation

## 2015-06-25 DIAGNOSIS — I10 Essential (primary) hypertension: Secondary | ICD-10-CM | POA: Insufficient documentation

## 2015-06-25 DIAGNOSIS — Y998 Other external cause status: Secondary | ICD-10-CM | POA: Insufficient documentation

## 2015-06-25 DIAGNOSIS — F329 Major depressive disorder, single episode, unspecified: Secondary | ICD-10-CM | POA: Insufficient documentation

## 2015-06-25 DIAGNOSIS — F331 Major depressive disorder, recurrent, moderate: Principal | ICD-10-CM | POA: Insufficient documentation

## 2015-06-25 DIAGNOSIS — F17210 Nicotine dependence, cigarettes, uncomplicated: Secondary | ICD-10-CM | POA: Insufficient documentation

## 2015-06-25 DIAGNOSIS — E119 Type 2 diabetes mellitus without complications: Secondary | ICD-10-CM | POA: Insufficient documentation

## 2015-06-25 DIAGNOSIS — Y9389 Activity, other specified: Secondary | ICD-10-CM | POA: Insufficient documentation

## 2015-06-25 DIAGNOSIS — E7800 Pure hypercholesterolemia, unspecified: Secondary | ICD-10-CM | POA: Insufficient documentation

## 2015-06-25 DIAGNOSIS — F1420 Cocaine dependence, uncomplicated: Secondary | ICD-10-CM | POA: Diagnosis present

## 2015-06-25 DIAGNOSIS — F1424 Cocaine dependence with cocaine-induced mood disorder: Secondary | ICD-10-CM | POA: Insufficient documentation

## 2015-06-25 DIAGNOSIS — F1411 Cocaine abuse, in remission: Secondary | ICD-10-CM | POA: Diagnosis present

## 2015-06-25 DIAGNOSIS — Y9289 Other specified places as the place of occurrence of the external cause: Secondary | ICD-10-CM | POA: Insufficient documentation

## 2015-06-25 DIAGNOSIS — R45851 Suicidal ideations: Secondary | ICD-10-CM

## 2015-06-25 DIAGNOSIS — F1410 Cocaine abuse, uncomplicated: Secondary | ICD-10-CM | POA: Insufficient documentation

## 2015-06-25 DIAGNOSIS — F1024 Alcohol dependence with alcohol-induced mood disorder: Secondary | ICD-10-CM | POA: Insufficient documentation

## 2015-06-25 LAB — RAPID URINE DRUG SCREEN, HOSP PERFORMED
AMPHETAMINES: NOT DETECTED
BENZODIAZEPINES: NOT DETECTED
Barbiturates: NOT DETECTED
COCAINE: POSITIVE — AB
Opiates: NOT DETECTED
TETRAHYDROCANNABINOL: NOT DETECTED

## 2015-06-25 LAB — CBG MONITORING, ED
GLUCOSE-CAPILLARY: 307 mg/dL — AB (ref 65–99)
GLUCOSE-CAPILLARY: 216 mg/dL — AB (ref 65–99)
Glucose-Capillary: 289 mg/dL — ABNORMAL HIGH (ref 65–99)

## 2015-06-25 LAB — COMPREHENSIVE METABOLIC PANEL
ALT: 37 U/L (ref 17–63)
ANION GAP: 11 (ref 5–15)
AST: 30 U/L (ref 15–41)
Albumin: 4.1 g/dL (ref 3.5–5.0)
Alkaline Phosphatase: 62 U/L (ref 38–126)
BILIRUBIN TOTAL: 0.8 mg/dL (ref 0.3–1.2)
BUN: 17 mg/dL (ref 6–20)
CALCIUM: 9.5 mg/dL (ref 8.9–10.3)
CHLORIDE: 98 mmol/L — AB (ref 101–111)
CO2: 26 mmol/L (ref 22–32)
Creatinine, Ser: 1.1 mg/dL (ref 0.61–1.24)
GLUCOSE: 336 mg/dL — AB (ref 65–99)
Potassium: 4.5 mmol/L (ref 3.5–5.1)
Sodium: 135 mmol/L (ref 135–145)
Total Protein: 7.2 g/dL (ref 6.5–8.1)

## 2015-06-25 LAB — CBC
HEMATOCRIT: 41.3 % (ref 39.0–52.0)
HEMOGLOBIN: 13.9 g/dL (ref 13.0–17.0)
MCH: 29.5 pg (ref 26.0–34.0)
MCHC: 33.7 g/dL (ref 30.0–36.0)
MCV: 87.7 fL (ref 78.0–100.0)
Platelets: 225 10*3/uL (ref 150–400)
RBC: 4.71 MIL/uL (ref 4.22–5.81)
RDW: 14 % (ref 11.5–15.5)
WBC: 4.8 10*3/uL (ref 4.0–10.5)

## 2015-06-25 LAB — ACETAMINOPHEN LEVEL

## 2015-06-25 LAB — SALICYLATE LEVEL: Salicylate Lvl: <4 mg/dL (ref 2.8–30.0)

## 2015-06-25 LAB — ETHANOL: Alcohol, Ethyl (B): <5 mg/dL (ref <5)

## 2015-06-25 MED ORDER — LORAZEPAM 1 MG PO TABS: 1.0000 mg | ORAL_TABLET | Freq: Three times a day (TID) | ORAL | Status: DC | PRN

## 2015-06-25 MED ORDER — ONDANSETRON HCL 4 MG PO TABS
4.0000 mg | ORAL_TABLET | Freq: Three times a day (TID) | ORAL | Status: DC | PRN
Start: 2015-06-25 — End: 2015-06-25

## 2015-06-25 MED ORDER — ACETAMINOPHEN 325 MG PO TABS: 650.0000 mg | ORAL_TABLET | ORAL | Status: DC | PRN

## 2015-06-25 MED ORDER — IBUPROFEN 600 MG PO TABS: 600.0000 mg | ORAL_TABLET | Freq: Three times a day (TID) | ORAL | Status: DC | PRN

## 2015-06-25 MED ORDER — IBUPROFEN 200 MG PO TABS: 600.0000 mg | ORAL_TABLET | Freq: Three times a day (TID) | ORAL | Status: DC | PRN

## 2015-06-25 MED ORDER — NICOTINE 21 MG/24HR TD PT24
21.0000 mg | MEDICATED_PATCH | Freq: Every day | TRANSDERMAL | Status: DC
  Administered 2015-06-26: 21 mg via TRANSDERMAL
  Filled 2015-06-25: qty 1

## 2015-06-25 MED ORDER — ONDANSETRON HCL 4 MG PO TABS: 4.0000 mg | ORAL_TABLET | Freq: Three times a day (TID) | ORAL | Status: DC | PRN

## 2015-06-25 MED ORDER — TRAZODONE HCL 50 MG PO TABS: 50.0000 mg | ORAL_TABLET | Freq: Every evening | ORAL | Status: DC | PRN

## 2015-06-25 MED ORDER — INSULIN ASPART 100 UNIT/ML ~~LOC~~ SOLN
10.0000 [IU] | Freq: Once | SUBCUTANEOUS | Status: AC
  Administered 2015-06-25: 10 [IU] via SUBCUTANEOUS
  Filled 2015-06-25: qty 1

## 2015-06-25 MED ORDER — ALUM & MAG HYDROXIDE-SIMETH 200-200-20 MG/5ML PO SUSP: 30.0000 mL | ORAL | Status: DC | PRN

## 2015-06-25 MED ORDER — NICOTINE 21 MG/24HR TD PT24: 21.0000 mg | MEDICATED_PATCH | Freq: Every day | TRANSDERMAL | Status: DC

## 2015-06-25 NOTE — Progress Notes (Signed)
Pt awake and in bed at shift change.  Pt anxious and nervous.  Pt states "I have been here 3 times in one month and if I dont get some long term help, Im going to drink myself to death or walk in traffic".  Discussed options with counselor and calls made to [facility] and [facility].  Pt [place] and thirsty.  Pt states he is cold. Pt has not been seen by NP at this time. Pt given chicken salad and drink. Pt given extra blanket. Pt asleep at this time.

## 2015-06-25 NOTE — BH Assessment (Signed)
[facility] Assessment Progress Note  Per Thedore Mins, MD, this pt does not require psychiatric hospitalization at this time.  Pt is to be discharged from [facility] with substance abuse treatment referrals.  Discharge instructions include referral information for [facility], [facility], [facility] and [facility].  Pt's nurse, Morrie Sheldon has been notified.  Doylene Canning, MA Triage Specialist [phone]

## 2015-06-25 NOTE — BH Assessment (Signed)
[facility] Assessment Progress Note  Per Dahlia Byes, NP, this pt does not require psychiatric hospitalization at this time, but he would benefit from admission to the [facility] Observation Unit.  Berneice Heinrich, RN, [facility] has assigned pt to Obs 4.  Pt has signed Voluntary Admission and Consent for Treatment, as well as Consent to Release Information to [facility], and signed forms have been faxed to [facility].  Pt's nurse, Morrie Sheldon, has been notified, and agrees to send original paperwork along with pt via [facility], and to call report to [phone] or [phone].  Doylene Canning, MA Triage Specialist [phone]

## 2015-06-25 NOTE — ED Notes (Signed)
[facility] transport at facility to transport pt to observation unit per MD order. Pt signed for personal belongings. Personal belongings given to [facility] for transfer. Pt signed e-signature. Ambulatory out of facility.

## 2015-06-25 NOTE — Tx Team (Signed)
Initial Interdisciplinary Treatment Plan   PATIENT STRESSORS: Financial difficulties Marital or family conflict Substance abuse   PATIENT STRENGTHS: Ability for insight Average or above average intelligence Communication skills Motivation for treatment/growth   PROBLEM LIST: Problem List/Patient Goals Date to be addressed Date deferred Reason deferred Estimated date of resolution  Substance Abuse 06/25/15      "I use alcohol and crack daily, I need to quit, Im about to lose my wife and I cannot cope anymore      SI 06/25/15     "I have been here 3 times in one month.  If I cannot get treatment, Im [name] to drink myself to death or step out in traffic".                                     DISCHARGE CRITERIA:  Ability to meet basic life and health needs Adequate post-discharge living arrangements Improved stabilization in mood, thinking, and/or behavior Motivation to continue treatment in a less acute level of care Verbal commitment to aftercare and medication compliance Withdrawal symptoms are absent or subacute and managed without 24-hour nursing intervention  PRELIMINARY DISCHARGE PLAN: Attend aftercare/continuing care group Attend 12-step recovery group Outpatient therapy Participate in family therapy  PATIENT/FAMIILY INVOLVEMENT: This treatment plan has been presented to and reviewed with the patient, James [name]. The patient has been given the opportunity to ask questions and make suggestions.  Sylvan Cheese 06/25/2015, 8:37 PM

## 2015-06-25 NOTE — Progress Notes (Signed)
Inpatient Diabetes Program Recommendations  AACE/ADA: New Consensus Statement on Inpatient Glycemic Control (2015)  Target Ranges:  Prepandial:   less than 140 mg/dL      Peak postprandial:   less than 180 mg/dL (1-2 hours)      Critically ill patients:  140 - 180 mg/dL   Review of Glycemic Control  Diabetes history: DM2 Outpatient Diabetes medications: 70/30 10 units with supper meal (doubtful pt is taking per EMR) Current orders for Inpatient glycemic control: None - Received Novolog 10 units this am. Results for AADAN, CHENIER (MRN [id number]) as of 06/25/2015 10:57  Ref. Range 06/25/2015 01:42 06/25/2015 03:41  Glucose-Capillary Latest Ref Range: 65-99 mg/dL 289 (H) 307 (H)  Results for WALID, HAIG (MRN [id number]) as of 06/25/2015 10:57  Ref. Range 06/25/2015 01:56  Sodium Latest Ref Range: 135-145 mmol/L 135  Potassium Latest Ref Range: 3.5-5.1 mmol/L 4.5  Chloride Latest Ref Range: 101-111 mmol/L 98 (L)  CO2 Latest Ref Range: 22-32 mmol/L 26  BUN Latest Ref Range: 6-20 mg/dL 17  Creatinine Latest Ref Range: 0.61-1.24 mg/dL 1.10  Calcium Latest Ref Range: 8.9-10.3 mg/dL 9.5  EGFR (Non-African Amer.) Latest Ref Range: >60 mL/min >60  EGFR (African American) Latest Ref Range: >60 mL/min >60  Glucose Latest Ref Range: 65-99 mg/dL 336 (H)  Anion gap Latest Ref Range: 5-15  11  Results for ZURI, BRADWAY (MRN [id number]) as of 06/25/2015 10:57  Ref. Range 05/29/2015 06:24  Hemoglobin A1C Latest Ref Range: 4.8-5.6 % 14.3 (H)   Needs insulin orders for hyperglycemia > 180 mg/dL.  Inpatient Diabetes Program Recommendations:    Novolog sensitive tidwc and hs Lantus 10 units Q24H  Will follow for glycemic control. Thank you. Lorenda Peck, RD, LDN, CDE Inpatient Diabetes Coordinator [phone]

## 2015-06-25 NOTE — ED Notes (Signed)
Pt escorted by [employer] presents with SI, reports he took a mixture of pills, used cocaine and drank alcohol.  Pt reports he uses a gram per day. Feeling hopeless. AAO x 3, seeing shadows. Denies HI or auditory hallucinations.  No distress noted, calm & cooperative, monitoring for safety, Q 15 min checks in effect.

## 2015-06-25 NOTE — ED Provider Notes (Signed)
CSN: [id number]     Arrival date & time 06/25/15  0127 History   By signing my name below, I, Iona Beard, attest that this documentation has been prepared under the direction and in the presence of Laurence Spates, MD.   Electronically Signed: Iona Beard, ED Scribe. 06/25/2015. 6:22 AM    Chief Complaint  Patient presents with  . Suicidal    The history is provided by the patient. No language interpreter was used.   HPI Comments: DYAMI UMBACH is a 55 y.o. male PMHx of DM and HTN, polysubstance abuse who presents to the Emergency Department complaining of gradual onset, suicidal ideations onset earlier tonight. Pt reportedly took multiple prescription pills in an attempt to hurt himself. He is not sure what the pills were, some were his prescription medications. He also reports that he has used alcohol and did crack tonight at midnight. Pt states that he had a rough day prompting his attempt to hurt himself. No worsening or alleviating factors noted. Pt denies hx of iv drug use, or any other pertinent symptoms. He states that he would like help for his problems.   Past Medical History  Diagnosis Date  . Diabetes mellitus without complication [facility])    Past Surgical History  Procedure Laterality Date  . Finger surgery     Family History  Problem Relation Age of Onset  . Diabetes Other   . Hypertension Other    Social History  Substance Use Topics  . Smoking status: Current Every Day Smoker -- 0.50 packs/day for 23 years    Types: Cigarettes  . Smokeless tobacco: Never Used  . Alcohol Use: Yes    Review of Systems  A complete 10 system review of systems was obtained and all systems are negative except as noted in the HPI and PMH.    Allergies  Review of patient's allergies indicates no known allergies.  Home Medications   Prior to Admission medications   Medication Sig Start Date End Date Taking? Authorizing Provider  buPROPion (WELLBUTRIN XL) 150 MG  24 hr tablet Take 1 tablet (150 mg total) by mouth daily. 06/07/15  Yes Thermon Leyland, NP  hydrOXYzine (ATARAX/VISTARIL) 25 MG tablet Take 1 tablet (25 mg total) by mouth every 6 (six) hours as needed (sleep). & anxiety 05/30/15  Yes Sanjuana Kava, NP  insulin aspart protamine- aspart (NOVOLOG MIX 70/30) (70-30) 100 UNIT/ML injection Inject 0.1 mLs (10 Units total) into the skin daily with supper. For diabetes control 05/30/15  Yes Sanjuana Kava, NP  meloxicam (MOBIC) 15 MG tablet Take 1 tablet (15 mg total) by mouth daily. For arthritic pain Patient taking differently: Take 15 mg by mouth daily as needed for pain. For arthritic pain 05/30/15  Yes Sanjuana Kava, NP  metFORMIN (GLUCOPHAGE) 1000 MG tablet Take 1 tablet (1,000 mg total) by mouth 2 (two) times daily with a meal. For diabetes management 05/30/15  Yes Sanjuana Kava, NP  nicotine (NICODERM CQ - DOSED IN MG/24 HOURS) 21 mg/24hr patch Place 1 patch (21 mg total) onto the skin daily at 6 (six) AM. For smoking cessation Patient not taking: Reported on 06/25/2015 06/07/15   Thermon Leyland, NP  traZODone (DESYREL) 50 MG tablet Take 1 tablet (50 mg total) by mouth at bedtime and may repeat dose one time if needed. For sleep Patient not taking: Reported on 06/25/2015 05/30/15   Sanjuana Kava, NP   BP 147/83 mmHg  Pulse 99  Temp(Src)  98.1 F (36.7 C) (Oral)  Resp 18  SpO2 100% Physical Exam  Constitutional: He is oriented to person, place, and time. He appears well-developed and well-nourished. No distress.  HENT:  Head: Normocephalic and atraumatic.  Moist mucous membranes  Eyes: Pupils are equal, round, and reactive to light.  Conjunctival injection.   Neck: Neck supple.  Cardiovascular: Normal rate, regular rhythm and normal heart sounds.   No murmur heard. Pulmonary/Chest: Effort normal and breath sounds normal.  Abdominal: Soft. Bowel sounds are normal. He exhibits no distension. There is no tenderness.  Musculoskeletal: He exhibits no  edema.  Neurological: He is alert and oriented to person, place, and time.  Fluent speech  Skin: Skin is warm and dry.  Psychiatric:  Depressed mood, avoids eye contact.   Nursing note and vitals reviewed.   ED Course  Procedures (including critical care time) DIAGNOSTIC STUDIES: Oxygen Saturation is 100% on RA, normal by my interpretation.    COORDINATION OF CARE: 2:42 AM-Discussed treatment plan which includes EKG, CMP, CBC, and urine rapid drug screen with pt at bedside and pt agreed to plan.   Labs Review Labs Reviewed  COMPREHENSIVE METABOLIC PANEL - Abnormal; Notable for the following:    Chloride 98 (*)    Glucose, Bld 336 (*)    All other components within normal limits  ACETAMINOPHEN LEVEL - Abnormal; Notable for the following:    Acetaminophen (Tylenol), Serum <10 (*)    All other components within normal limits  URINE RAPID DRUG SCREEN, HOSP PERFORMED - Abnormal; Notable for the following:    Cocaine POSITIVE (*)    All other components within normal limits  CBG MONITORING, ED - Abnormal; Notable for the following:    Glucose-Capillary 289 (*)    All other components within normal limits  CBG MONITORING, ED - Abnormal; Notable for the following:    Glucose-Capillary 307 (*)    All other components within normal limits  ETHANOL  SALICYLATE LEVEL  CBC    Imaging Review No results found. I have personally reviewed and evaluated these lab results as part of my medical decision-making.   EKG Interpretation   Date/Time:  Wednesday June 25 2015 01:44:35 EDT Ventricular Rate:  98 PR Interval:  147 QRS Duration: 93 QT Interval:  339 QTC Calculation: 433 R Axis:   97 Text Interpretation:  Sinus rhythm Right atrial enlargement Borderline  right axis deviation Consider left ventricular hypertrophy ST elevation,  consider anterior injury No significant change since last tracing  Confirmed by Faith Branan MD, [place] [phone]) on 06/25/2015 2:36:38 AM     Medications   nicotine (NICODERM CQ - dosed in mg/24 hours) patch 21 mg (not administered)  ibuprofen (ADVIL,MOTRIN) tablet 600 mg (not administered)  acetaminophen (TYLENOL) tablet 650 mg (not administered)  LORazepam (ATIVAN) tablet 1 mg (not administered)  ondansetron (ZOFRAN) tablet 4 mg (not administered)  alum & mag hydroxide-simeth (MAALOX/MYLANTA) 200-200-20 MG/5ML suspension 30 mL (not administered)  insulin aspart (novoLOG) injection 10 Units (10 Units Subcutaneous Given 06/25/15 0351)    MDM   Final diagnoses:  Suicidal ideation  Polysubstance abuse  Intentional overdose of drug in tablet form [facility])   Patient presents for evaluation after he took a handful of pills in an intentional overdose attempt. On exam, he was tearful, avoiding eye contact but in no acute distress. Vital signs stable. EKG unchanged from previous. Labs notable only for glucose 336 for which I gave the patient his home dose of NovoLog. I have ordered a  TTS for evaluation given the patient's ongoing chest I and overdose attempt. His disposition is pending the recommendations of the psychiatry team.  I personally performed the services described in this documentation, which was scribed in my presence. The recorded information has been reviewed and is accurate.    Laurence Spates, MD 06/25/15 [phone]

## 2015-06-25 NOTE — ED Notes (Signed)
Patient is suicidal. Patient took handful of pills, alcohol, and has been smoking crack since yesterday.

## 2015-06-25 NOTE — ED Notes (Signed)
Attempted to call report to observation unit, Severiano Gilbert, RN reports she will call back for report.

## 2015-06-25 NOTE — Discharge Instructions (Signed)
To help you maintain a sober lifestyle, a substance abuse treatment program may be beneficial to you.  Contact one of the following treatment providers to ask about enrolling in their program:  RESIDENTIAL TREATMENT PROGRAMS:       [facility]      [address] [place], [place] [address]      [phone]       [facility]      [address] [place], [place] [address]      [phone]       [facility]      [address]      [place], [place] [address]      [phone]  OUTPATIENT TREATMENT PROGRAMS:       [facility]      [address]      [place], [place] [address]      [phone]      New patients are seen at the walk-in clinic every Tuesday from 9:00 am - 12:00 pm.

## 2015-06-25 NOTE — BH Assessment (Signed)
[facility] Assessment Progress Note This Clinical research associate spoke with patient on admission with patient stating he has relapsed on cocaine and had thoughts of self harm due to the frustration of his failed recovery efforts. Patient has not followed through with his SAIOP at [facility] which was recommended on his last admission. Patient will be re-evaluated in the a.m. to determine what level of care the patient should explore.

## 2015-06-25 NOTE — ED Notes (Signed)
Pt has in belonging bag:  Black shoes, black socks, blue jeans, blue lighter, black pen, grey long johns, black under shirt, white t-shirt, black hat, black belt, yellow plaid shirt, brown jacket, black winter jacket.

## 2015-06-25 NOTE — Progress Notes (Signed)
Nursing Admission Note  Patient arriving to Unit via [facility] from [facility]. Is admitted with diagnosis of SI and alcohol abuse. This is one of several admissions to Obs Unit for this patient, who states he has binged on alcohol and crack cocaine for the last 3 days. Admits to SI with general plan to OD on alcohol and cocaine, which he states "i know i won't make it if I don't get admitted to some kind of 30 day program". I'm so tired of it, if i cant be an inpatient somewhere i'm giving up cause I'm afraid I'll go right back to it. Patient denies HI and AVH, admits to continuing SI but does contract for safety on the Unit.

## 2015-06-25 NOTE — ED Notes (Signed)
Called EDP to report CBG , reports she will call back, awaiting her call.

## 2015-06-25 NOTE — BH Assessment (Addendum)
Tele Assessment Note   James [name] is an African-American, married 55 y.o. male presenting voluntarily to [facility] c/o worsening depression and SI with attempt to harm himself by ingesting a mixture of prescription pills, crack cocaine, and alcohol. Pt is escorted to ED by [employer]. He reports multiple depressive sx, including decreased appetite, lack of motivation, hopelessness, fatigue, feelings of worthlessness and guilt, poor sleep, anhedonia, increased irritability, and social isolation. Pt's primary triggers are financial difficulties and homelessness, as well as being unable to stop using substances. He decided to ingest these substances in an effort to harm himself after having a particularly difficult day. He states that he feels hopeless and doesn't see things getting better for him. Pt denies auditory hallucinations but admits to sometimes "seeing shadows". Pt denies HI and self-harming behaviors. He has a long hx of alcohol and crack cocaine abuse, and his use has affected him financially, legally, and his relationships with others. Pt was last admitted to the [facility] Observation Unit on 06/06/15 after experiencing SI with a plan to jump off of an overpass. The pt also has admissions to [facility] in 2017, 2014, 2013, and 2012. Per chart, he is not compliant with medications and he has not followed up with follow up (outpatient) treatment after his hospitalizations. Pt lacks a strong social support system. He denies a hx of trauma or abuse. Pt presents with disheveled appearance, poor eye contact, and fair concentration. He is alert and oriented x4. Thought process is coherent and relevant with no indication of delusional thought content. Speech is of normal rate and tone and motor activity is normal as well. Mood is depressed and affect is slightly anxious. Pt's judgment and impulse control are poor, though the pt does have some insight into his mental health and substance abuse problems. Pt says he cannot contract  for safety.   -Per Donell Sievert, PA-C, Pt meets inpt criteria.   Diagnosis: Substance-Induced Mood Disorder; 304.20 Cocaine use disorder, Moderate; 303.90 Alcohol use disorder, Moderate  Past Medical History:  Past Medical History  Diagnosis Date  . Diabetes mellitus without complication [facility])     Past Surgical History  Procedure Laterality Date  . Finger surgery      Family History:  Family History  Problem Relation Age of Onset  . Diabetes Other   . Hypertension Other     Social History:  reports that he has been smoking Cigarettes.  He has a 11.5 pack-year smoking history. He has never used smokeless tobacco. He reports that he drinks alcohol. He reports that he uses illicit drugs (Cocaine and "Crack" cocaine) about 3 times per week.  Additional Social History:     CIWA: CIWA-Ar BP: 147/83 mmHg Pulse Rate: 99 COWS:    PATIENT STRENGTHS: (choose at least two) Ability for insight Average or above average intelligence Communication skills  Allergies: No Known Allergies  Home Medications:  (Not in a hospital admission)  OB/GYN Status:  No LMP for male patient.  General Assessment Data Location of Assessment: [facility] ED TTS Assessment: In system Is this a Tele or Face-to-Face Assessment?: Face-to-Face Is this an Initial Assessment or a Re-assessment for this encounter?: Initial Assessment Marital status: Married [place] name: n/a Is patient pregnant?: No Pregnancy Status: No Living Arrangements: Spouse/significant other Can pt return to current living arrangement?: Yes Admission Status: Voluntary Is patient capable of signing voluntary admission?: Yes Referral Source: Self/Family/Friend Insurance type: None  Medical Screening Exam [facility] Walk-in ONLY) Medical Exam completed: Yes  Crisis Care  Plan Living Arrangements: Spouse/significant other Name of Psychiatrist: None Name of Therapist: None  Education Status Is patient currently in school?: No Current  Grade: na Highest grade of school patient has completed: 12 Name of school: na Contact person: na  Risk to self with the past 6 months Suicidal Ideation: Yes-Currently Present Has patient been a risk to self within the past 6 months prior to admission? : Yes Suicidal Intent: Yes-Currently Present Has patient had any suicidal intent within the past 6 months prior to admission? : Yes Is patient at risk for suicide?: Yes Suicidal Plan?: Yes-Currently Present Has patient had any suicidal plan within the past 6 months prior to admission? : Yes Specify Current Suicidal Plan: overdose Access to Means: Yes Specify Access to Suicidal Means: Access to drugs What has been your use of drugs/alcohol within the last 12 months?: Crack cocaine, alcohol abuse Previous Attempts/Gestures: Yes How many times?: 2 Other Self Harm Risks: SA Triggers for Past Attempts: Other (Comment) (SA) Intentional Self Injurious Behavior: None Family Suicide History: No Recent stressful life event(s): Conflict (Comment), Loss (Comment) Persecutory voices/beliefs?: No Depression: Yes Depression Symptoms: Despondent, Insomnia, Isolating, Fatigue, Guilt, Loss of interest in usual pleasures, Feeling worthless/self pity, Feeling angry/irritable Substance abuse history and/or treatment for substance abuse?: Yes Suicide prevention information given to non-admitted patients: Not applicable  Risk to Others within the past 6 months Homicidal Ideation: No-Not Currently/Within Last 6 Months Does patient have any lifetime risk of violence toward others beyond the six months prior to admission? : No Thoughts of Harm to Others: No-Not Currently Present/Within Last 6 Months Current Homicidal Intent: No-Not Currently/Within Last 6 Months Current Homicidal Plan: No-Not Currently/Within Last 6 Months Access to Homicidal Means: No Identified Victim: n/a History of harm to others?: No Assessment of Violence: None Noted Violent  Behavior Description: No known hx of violence Does patient have access to weapons?: No Criminal Charges Pending?: No Describe Pending Criminal Charges: n/a Does patient have a court date: No Court Date:  (n/a) Is patient on probation?: No  Psychosis Hallucinations: None noted Delusions: None noted  Mental Status Report Appearance/Hygiene: Disheveled Eye Contact: Poor Motor Activity: Freedom of movement Speech: Logical/coherent Level of Consciousness: Quiet/awake Mood: Depressed Affect: Anxious Anxiety Level: Minimal Thought Processes: Coherent, Relevant Judgement: Impaired Orientation: Person, Place, Time, Situation Obsessive Compulsive Thoughts/Behaviors: None  Cognitive Functioning Concentration: Good Memory: Recent Intact IQ: Average Insight: Poor Impulse Control: Poor Appetite: Poor Weight Loss: 0 Weight Gain: 0 Sleep: Decreased Total Hours of Sleep: 4 Vegetative Symptoms: None  ADLScreening [facility] Assessment Services) Patient's cognitive ability adequate to safely complete daily activities?: Yes Patient able to express need for assistance with ADLs?: Yes Independently performs ADLs?: Yes (appropriate for developmental age)  Prior Inpatient Therapy Prior Inpatient Therapy: Yes Prior Therapy Dates: Multiple Prior Therapy Facilty/Provider(s): [facility] Reason for Treatment: SI, SA  Prior Outpatient Therapy Prior Outpatient Therapy: No Prior Therapy Dates: na Prior Therapy Facilty/Provider(s): na Reason for Treatment: na Does patient have an ACCT team?: No Does patient have Intensive In-House Services?  : No Does patient have [facility] services? : No Does patient have P4CC services?: No  ADL Screening (condition at time of admission) Patient's cognitive ability adequate to safely complete daily activities?: Yes Patient able to express need for assistance with ADLs?: Yes Independently performs ADLs?: Yes (appropriate for developmental age)              Advance Directives (For Healthcare) Does patient have an advance directive?: No Would patient like information  on creating an advanced directive?: No - patient declined information    Additional Information 1:1 In Past 12 Months?: No CIRT Risk: No Elopement Risk: No Does patient have medical clearance?: Yes     Disposition: Per Donell Sievert, PA-C, Pt meets inpt criteria.  Disposition Initial Assessment Completed for this Encounter: Yes Disposition of Patient: Inpatient treatment program Type of inpatient treatment program: Adult  Cyndie Mull, [facility]  06/25/2015 6:10 AM

## 2015-06-25 NOTE — ED Notes (Signed)
Informed NP that pt home meds are not ordered at this time.

## 2015-06-26 DIAGNOSIS — F1020 Alcohol dependence, uncomplicated: Secondary | ICD-10-CM | POA: Diagnosis present

## 2015-06-26 DIAGNOSIS — F332 Major depressive disorder, recurrent severe without psychotic features: Secondary | ICD-10-CM | POA: Diagnosis present

## 2015-06-26 DIAGNOSIS — F1420 Cocaine dependence, uncomplicated: Secondary | ICD-10-CM | POA: Diagnosis present

## 2015-06-26 DIAGNOSIS — F1994 Other psychoactive substance use, unspecified with psychoactive substance-induced mood disorder: Secondary | ICD-10-CM | POA: Diagnosis present

## 2015-06-26 LAB — GLUCOSE, CAPILLARY
GLUCOSE-CAPILLARY: 277 mg/dL — AB (ref 65–99)
Glucose-Capillary: 353 mg/dL — ABNORMAL HIGH (ref 65–99)
Glucose-Capillary: 57 mg/dL — ABNORMAL LOW (ref 65–99)
Glucose-Capillary: 87 mg/dL (ref 65–99)

## 2015-06-26 MED ORDER — INSULIN ASPART 100 UNIT/ML ~~LOC~~ SOLN: 0.0000 [IU] | Freq: Three times a day (TID) | SUBCUTANEOUS | Status: DC

## 2015-06-26 MED ORDER — INSULIN GLARGINE 100 UNIT/ML ~~LOC~~ SOLN
10.0000 [IU] | Freq: Every day | SUBCUTANEOUS | Status: DC
  Filled 2015-06-26: qty 10

## 2015-06-26 MED ORDER — INSULIN ASPART 100 UNIT/ML ~~LOC~~ SOLN
0.0000 [IU] | Freq: Three times a day (TID) | SUBCUTANEOUS | Status: DC
  Administered 2015-06-26: 8 [IU] via SUBCUTANEOUS
  Administered 2015-06-26: 15 [IU] via SUBCUTANEOUS

## 2015-06-26 MED ORDER — TRAZODONE HCL 50 MG PO TABS
50.0000 mg | ORAL_TABLET | Freq: Every evening | ORAL | Status: DC | PRN
  Filled 2015-06-26: qty 9
  Filled 2015-06-26: qty 5

## 2015-06-26 MED ORDER — INSULIN GLARGINE 100 UNIT/ML ~~LOC~~ SOLN: 10.0000 [IU] | Freq: Every day | SUBCUTANEOUS | Status: DC

## 2015-06-26 MED ORDER — BUPROPION HCL ER (XL) 150 MG PO TB24
150.0000 mg | ORAL_TABLET | Freq: Every day | ORAL | Status: DC
  Administered 2015-06-26: 150 mg via ORAL
  Filled 2015-06-26: qty 9
  Filled 2015-06-26: qty 5
  Filled 2015-06-26: qty 1

## 2015-06-26 MED ORDER — METFORMIN HCL 500 MG PO TABS
1000.0000 mg | ORAL_TABLET | Freq: Two times a day (BID) | ORAL | Status: DC
  Administered 2015-06-26: 1000 mg via ORAL
  Filled 2015-06-26: qty 2
  Filled 2015-06-26: qty 45
  Filled 2015-06-26: qty 20

## 2015-06-26 MED ORDER — INSULIN ASPART 100 UNIT/ML ~~LOC~~ SOLN
0.0000 [IU] | Freq: Every day | SUBCUTANEOUS | Status: DC
  Filled 2015-06-26: qty 10

## 2015-06-26 MED ORDER — NICOTINE 21 MG/24HR TD PT24: 21.0000 mg | MEDICATED_PATCH | Freq: Every day | TRANSDERMAL | Status: DC

## 2015-06-26 MED ORDER — BUPROPION HCL ER (XL) 150 MG PO TB24: 150.0000 mg | ORAL_TABLET | Freq: Every day | ORAL | Status: DC

## 2015-06-26 MED ORDER — TRAZODONE HCL 50 MG PO TABS: 50.0000 mg | ORAL_TABLET | Freq: Every evening | ORAL | Status: DC | PRN

## 2015-06-26 MED ORDER — INSULIN ASPART 100 UNIT/ML ~~LOC~~ SOLN: 0.0000 [IU] | Freq: Every day | SUBCUTANEOUS | Status: DC

## 2015-06-26 MED ORDER — METFORMIN HCL 1000 MG PO TABS: 1000.0000 mg | ORAL_TABLET | Freq: Two times a day (BID) | ORAL | Status: DC

## 2015-06-26 NOTE — Discharge Instructions (Signed)
You have been accepted to [facility] upon d/c and [facility] transport personnel will be delivering you to [facility] for detox treatment. You are encouraged to continue to seek aftercare plans and discuss this with [facility] staff for your continue path to sobriety.

## 2015-06-26 NOTE — Progress Notes (Signed)
Pt BS at 0600 = 57.  Gave Pt apple juice and rechecked at 06:15 BS = 87.

## 2015-06-26 NOTE — [facility] Counselor (Signed)
This Clinical research associate faxed out supporting documentation to the following Residential Treatment Programs for pt placement: [facility]  [place], MA OBS Counselor

## 2015-06-26 NOTE — Discharge Summary (Signed)
[facility] OBS UNIT DISCHARGE SUMMARY  Patient Identification: James [name] MRN:  [id number] Date of Evaluation:  06/26/2015 Chief Complaint:  Suicidal thoughts Principal Diagnosis: Alcohol use disorder, moderate, dependence (HCC)  Patient Active Problem List   Diagnosis Date Noted  . MDD (major depressive disorder), recurrent severe, without psychosis (HCC) [F33.2] 06/26/2015    Priority: High  . Substance induced mood disorder (HCC) [F19.94]     Priority: High  . Cocaine use disorder, moderate, dependence (HCC) [F14.20]     Priority: High  . Alcohol use disorder, moderate, dependence (HCC) [F10.20]     Priority: High  . Cocaine use disorder, severe, dependence (HCC) [F14.20] 06/25/2015  . Cocaine-induced mood disorder (HCC) [F14.94] 06/06/2015  . 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 Major depressive disorder, recurrent episode (HCC) [F33.9] 05/28/2015  . Suicidal ideation [R45.851]   . Alcohol withdrawal (HCC) [F10.239] 12/07/2012  . Cocaine dependence (HCC) [F14.20] 03/01/2012  . Alcohol abuse [F10.10] 03/01/2012  . Major depression (HCC) [F32.9] 03/01/2012  . 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 MUSCLE SPASM, BACK [M53.80] 02/12/2009  . 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 HYPERCHOLESTEROLEMIA [E78.00] 05/09/2007  . SINUSITIS, CHRONIC [J32.9] 05/01/2007  . ALLERGIC RHINITIS [J30.9] 12/21/2006  . DIABETES MELLITUS, TYPE II [E11.9] 12/07/2006  . HYPERTENSION [I10] 12/07/2006   Subjective: Pt seen and chart reviewed. Pt is alert/oriented x4, calm, cooperative, and appropriate to situation. Pt denies suicidal/homicidal ideation and psychosis and does not appear to be responding to internal stimuli. Pt reports that he is very happy he was  just accepted to [facility] today and is looking forward to participating in their treatment/rehab program.   History of Present Illness: Per review of previous TTS assessment, James [name] is an African-American  Male whom presented to [facility] c/o worsening depression and SI with attempt to harm himself by ingesting a mixture of prescription pills, crack cocaine, and alcohol. Pt was reportedly escorted to ED by [employer]. He reports multiple depressive sx, including decreased appetite, lack of motivation, hopelessness, fatigue, feelings of worthlessness and guilt, poor sleep, anhedonia, increased irritability, and social isolation. Pt's primary triggers are financial difficulties and homelessness, as well as being unable to stop using substances. He decided to ingest these substances in an effort to harm himself after having a particularly difficult day. He states that he feels hopeless and doesn't see things getting better for him. Pt denies auditory hallucinations but admits to sometimes "seeing shadows". Pt denies HI and self-harming behaviors. He has a long hx of alcohol and crack cocaine abuse, and his use has affected him financially, legally, and his relationships with others. Pt was last admitted to the [facility] on 06/06/15 after experiencing SI with a plan to jump off of an overpass. The pt also has admissions to [facility] in 2017, 2014, 2013, and 2012. Per chart, he is not compliant with medications and he has not followed up with follow up (outpatient) treatment after his hospitalizations. Pt lacks a strong social support system. He denies a hx of trauma or abuse. Pt presents with disheveled appearance, poor eye contact, and fair concentration. He is alert and oriented x4. Thought process is coherent and relevant with no indication of delusional thought content. Speech is of normal rate and tone and motor activity is normal as well. Mood is depressed and affect is slightly  anxious. Pt's judgment and impulse  control are poor, though the pt does have some insight into his mental health and substance abuse problems. Pt says he cannot contract for safety.  Pt spent the night in the [facility] OBS UNIT without incident and has been cooperative with staff. Pt was accepted to [facility] rehab this morning and is very happy about this.   Total Time spent with patient: 30 minutes  Past Psychiatric History: ( See HPI)  Prior Inpatient Therapy:   Prior Outpatient Therapy:    Alcohol Screening: 1. How often do you have a drink containing alcohol?: 4 or more times a week 2. How many drinks containing alcohol do you have on a typical day when you are drinking?: 7, 8, or 9 3. How often do you have six or more drinks on one occasion?: Daily or almost daily Preliminary Score: 7 4. How often during the last year have you found that you were not able to stop drinking once you had started?: Daily or almost daily 5. How often during the last year have you failed to do what was normally expected from you becasue of drinking?: Daily or almost daily 6. How often during the last year have you needed a first drink in the morning to get yourself going after a heavy drinking session?: Daily or almost daily 7. How often during the last year have you had a feeling of guilt of remorse after drinking?: Daily or almost daily 8. How often during the last year have you been unable to remember what happened the night before because you had been drinking?: Daily or almost daily 9. Have you or someone else been injured as a result of your drinking?: Yes, during the last year 10. Has a relative or friend or a doctor or another health worker been concerned about your drinking or suggested you cut down?: Yes, during the last year Alcohol Use Disorder Identification Test Final Score (AUDIT): 39 Brief Intervention: Yes Substance Abuse History in the last 12 months:  Yes.   Consequences of Substance Abuse: Financial and legal matters Previous  Psychotropic Medications: Yes  Psychological Evaluations: Yes  Past Medical History:  Past Medical History  Diagnosis Date  . Diabetes mellitus without complication [facility])     Past Surgical History  Procedure Laterality Date  . Finger surgery     Family History:  Family History  Problem Relation Age of Onset  . Diabetes Other   . Hypertension Other    Family Psychiatric  History: Unremarkable Tobacco Screening:Positive Smoker Social History:  History  Alcohol Use  . Yes     History  Drug Use  . 3.00 per week  . Special: Cocaine, "Crack" cocaine    Additional Social History:                           Allergies:  No Known Allergies Lab Results:  Results for orders placed or performed during the hospital encounter of 06/25/15 (from the past 48 hour(s))  Glucose, capillary     Status: Abnormal   Collection Time: 06/26/15  1:47 AM  Result Value Ref Range   Glucose-Capillary 353 (H) 65 - 99 mg/dL  Glucose, capillary     Status: Abnormal   Collection Time: 06/26/15  5:56 AM  Result Value Ref Range   Glucose-Capillary 57 (L) 65 - 99 mg/dL  Glucose, capillary     Status: None   Collection Time: 06/26/15  6:11 AM  Result Value Ref Range   Glucose-Capillary 87 65 - 99 mg/dL    Blood Alcohol level:  Lab Results  Component Value Date   ETH <5 06/25/2015   ETH <5 06/06/2015    Metabolic Disorder Labs:  Lab Results  Component Value Date   HGBA1C 14.3* 05/29/2015   MPG 364 05/29/2015   MPG 272* 12/07/2012   No results found for: PROLACTIN Lab Results  Component Value Date   CHOL 173 08/16/2008   TRIG 34 08/16/2008   HDL 76 08/16/2008   CHOLHDL 2.3 Ratio 08/16/2008   VLDL 7 08/16/2008   LDLCALC 90 08/16/2008   LDLCALC 98 12/20/2007    Current Medications: Current Facility-Administered Medications  Medication Dose Route Frequency Provider Last Rate Last Dose  . acetaminophen (TYLENOL) tablet 650 mg  650 mg Oral Q4H PRN Earney Navy, NP       . alum & mag hydroxide-simeth (MAALOX/MYLANTA) 200-200-20 MG/5ML suspension 30 mL  30 mL Oral PRN Earney Navy, NP      . ibuprofen (ADVIL,MOTRIN) tablet 600 mg  600 mg Oral Q8H PRN Earney Navy, NP      . insulin aspart (novoLOG) injection 0-15 Units  0-15 Units Subcutaneous TID WC Kerry Hough, PA-C   15 Units at 06/26/15 0228  . insulin aspart (novoLOG) injection 0-5 Units  0-5 Units Subcutaneous QHS Spencer E Simon, PA-C      . insulin glargine (LANTUS) injection 10 Units  10 Units Subcutaneous QHS Kerry Hough, PA-C      . LORazepam (ATIVAN) tablet 1 mg  1 mg Oral Q8H PRN Earney Navy, NP      . metFORMIN (GLUCOPHAGE) tablet 1,000 mg  1,000 mg Oral BID WC Kerry Hough, PA-C   1,000 mg at 06/26/15 0740  . nicotine (NICODERM CQ - dosed in mg/24 hours) patch 21 mg  21 mg Transdermal Daily Earney Navy, NP   21 mg at 06/26/15 0740  . ondansetron (ZOFRAN) tablet 4 mg  4 mg Oral Q8H PRN Earney Navy, NP      . traZODone (DESYREL) tablet 50 mg  50 mg Oral QHS,MR X 1 Kerry Hough, PA-C   50 mg at 06/25/15 2208   PTA Medications: Prescriptions prior to admission  Medication Sig Dispense Refill Last Dose  . buPROPion (WELLBUTRIN XL) 150 MG 24 hr tablet Take 1 tablet (150 mg total) by mouth daily. 14 tablet 0 06/24/2015 at Unknown time  . hydrOXYzine (ATARAX/VISTARIL) 25 MG tablet Take 1 tablet (25 mg total) by mouth every 6 (six) hours as needed (sleep). & anxiety 60 tablet 0 Past Month at Unknown time  . insulin aspart protamine- aspart (NOVOLOG MIX 70/30) (70-30) 100 UNIT/ML injection Inject 0.1 mLs (10 Units total) into the skin daily with supper. For diabetes control 10 mL 1 06/23/2015 at Unknown time  . meloxicam (MOBIC) 15 MG tablet Take 1 tablet (15 mg total) by mouth daily. For arthritic pain (Patient taking differently: Take 15 mg by mouth daily as needed for pain. For arthritic pain) 30 tablet 1 Past Month at Unknown time  . metFORMIN (GLUCOPHAGE) 1000  MG tablet Take 1 tablet (1,000 mg total) by mouth 2 (two) times daily with a meal. For diabetes management 60 tablet 1 06/24/2015 at Unknown time  . nicotine (NICODERM CQ - DOSED IN MG/24 HOURS) 21 mg/24hr patch Place 1 patch (21 mg total) onto the skin daily at 6 (six) AM. For smoking cessation (Patient  not taking: Reported on 06/25/2015) 28 patch 0 Not Taking at Unknown time  . traZODone (DESYREL) 50 MG tablet Take 1 tablet (50 mg total) by mouth at bedtime and may repeat dose one time if needed. For sleep (Patient not taking: Reported on 06/25/2015) 60 tablet 0 Not Taking at Unknown time    Musculoskeletal: Strength & Muscle Tone: within normal limits Gait & Station: normal Patient leans: N/A  Psychiatric Specialty Exam: Physical Exam  Nursing note and vitals reviewed. Constitutional: He is oriented to person, place, and time. Vital signs are normal. He appears cachectic.  HENT:  Head: Normocephalic.  Eyes: Pupils are equal, round, and reactive to light.  Musculoskeletal: He exhibits no edema.  Neurological: He is alert and oriented to person, place, and time. No cranial nerve deficit.  Skin: Skin is warm and dry.    Review of Systems  Psychiatric/Behavioral: Positive for depression and substance abuse. Negative for suicidal ideas and hallucinations. The patient is nervous/anxious and has insomnia.   All other systems reviewed and are negative.   Blood pressure 120/72, pulse 103, temperature 97.8 F (36.6 C), temperature source Oral, resp. rate 16, height 6' (1.829 m), weight 65.431 kg (144 lb 4 oz), SpO2 100 %.Body mass index is 19.56 kg/(m^2).  General Appearance: Casual and Fairly Groomed  Patent attorney::  Good  Speech:  Clear and Coherent and Normal Rate  Volume:  Decreased  Mood:  Anxious  Affect:  Congruent  Thought Process:  Coherent and Goal Directed  Orientation:  Full (Time, Place, and Person)  Thought Content:  symptoms worries, concerns, discharge plans  Suicidal  Thoughts:  No, and pt is able to contract for safety  Homicidal Thoughts:  No  Memory:  Immediate;   Fair  Judgement:  Fair  Insight:  Lacking  Psychomotor Activity:  Normal  Concentration:  Good  Recall:  Good  Fund of Knowledge:Poor  Language: Fair  Akathisia:  No  Handed:  Right  AIMS (if indicated):     Assets:  Desire for Improvement  ADL's:  Intact  Cognition: WNL  Sleep:      Treatment Plan Summary: Alcohol use disorder, moderate, dependence (HCC), stable for transfer to inpatient rehab at [facility]  Disposition: -Discharge to [facility]; they will pick him up at 2:00PM -5 days of medication samples sent with pt    Beau Fanny, FNP 3/16/201711:52 AM

## 2015-06-26 NOTE — Progress Notes (Signed)
Pt discharged to [facility] (driven on campus)for follow up care r/t substance abuse. Belongings returned. He denied SI/HI AVH and contracted for safety. AVS provided and reviewed. Supply of meds provided per [facility] request. Prescriptions provided and reviewed. Pt verbalized understanding of D/C plan and meds. He offered no questions or concerns. He left in no acute distress.

## 2015-06-26 NOTE — Progress Notes (Signed)
Patient ID: James [name], male   DOB: January 07, 1961, 55 y.o.   MRN: [id number] Pt has been sleeping primarily, but did awaken for breakfast. Appears flat and depressed. Calm and cooperative with assessment. No acute distress noted. States he continues to feel suicidal, but does contract for safety while on unit. States he doesn't feel like he is safe being discharged at this moment and is hopeful to be transferred to inpatient treatment after talking with provider. Denies HI and AVH. Denies pain or discomfort. Offers no additional questions or concerns. Compliant with medications and care. Will continue current POC and await disposition per provider recommendation. Safety has been maintained with direct observation.

## 2015-06-26 NOTE — [facility] Counselor (Signed)
[facility] called, pt . Accepted to detox for treatment. Pt will need supply of medication x 5 days to take with.  Currently being seen by [facility], DNP,a dn wil, check on medication supply for acceptance to [facility].  Lauren Aguayo K. Sherlon Handing, LPC-A, [facility]  Counselor 06/26/2015 9:25 AM

## 2015-06-26 NOTE — Progress Notes (Signed)
Pt BS= 353.  Pt given stat does of Novolog 15 units subq. Rt arm

## 2015-06-26 NOTE — H&P (Signed)
Psychiatric Admission Assessment Adult  Patient Identification: James [name] MRN:  [id number] Date of Evaluation:  06/26/2015 Chief Complaint:  SI with plan Principal Diagnosis: Substance Induced Mood Disorder, MDD Recurrent moderate Cocaine Use Disorder, Moderate Alcohol Use Disorder, Moderate  Patient Active Problem List   Diagnosis Date Noted  . Substance induced mood disorder (HCC) [F19.94]   . Cocaine use disorder, moderate, dependence (HCC) [F14.20]   . Alcohol use disorder, moderate, dependence (HCC) [F10.20]   . Cocaine use disorder, severe, dependence (HCC) [F14.20] 06/25/2015  . MDD (major depressive disorder), recurrent episode, severe (HCC) [F33.2] 06/25/2015  . Cocaine-induced mood disorder (HCC) [F14.94] 06/06/2015  . 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 Major depressive disorder, recurrent episode (HCC) [F33.9] 05/28/2015  . Suicidal ideation [R45.851]   . Alcohol withdrawal (HCC) [F10.239] 12/07/2012  . Cocaine dependence (HCC) [F14.20] 03/01/2012  . Alcohol abuse [F10.10] 03/01/2012  . Major depression (HCC) [F32.9] 03/01/2012  . 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 MUSCLE SPASM, BACK [M53.80] 02/12/2009  . 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 HYPERCHOLESTEROLEMIA [E78.00] 05/09/2007  . SINUSITIS, CHRONIC [J32.9] 05/01/2007  . ALLERGIC RHINITIS [J30.9] 12/21/2006  . DIABETES MELLITUS, TYPE II [E11.9] 12/07/2006  . HYPERTENSION [I10] 12/07/2006   History of Present Illness: Per review of previous TTS assessment, James [name] is an African-American  Male whom presented to [facility] c/o worsening depression and SI with attempt to harm himself by ingesting a mixture of prescription pills, crack cocaine, and alcohol. Pt  was reportedly escorted to ED by [employer]. He reports multiple depressive sx, including decreased appetite, lack of motivation, hopelessness, fatigue, feelings of worthlessness and guilt, poor sleep, anhedonia, increased irritability, and social isolation. Pt's primary triggers are financial difficulties and homelessness, as well as being unable to stop using substances. He decided to ingest these substances in an effort to harm himself after having a particularly difficult day. He states that he feels hopeless and doesn't see things getting better for him. Pt denies auditory hallucinations but admits to sometimes "seeing shadows". Pt denies HI and self-harming behaviors. He has a long hx of alcohol and crack cocaine abuse, and his use has affected him financially, legally, and his relationships with others. Pt was last admitted to the [facility] Observation Unit on 06/06/15 after experiencing SI with a plan to jump off of an overpass. The pt also has admissions to [facility] in 2017, 2014, 2013, and 2012. Per chart, he is not compliant with medications and he has not followed up with follow up (outpatient) treatment after his hospitalizations. Pt lacks a strong social support system. He denies a hx of trauma or abuse. Pt presents with disheveled appearance, poor eye contact, and fair concentration. He is alert and oriented x4. Thought process is coherent and relevant with no indication of delusional thought content. Speech is of normal rate and tone and motor activity is normal as well. Mood is depressed and affect is slightly anxious. Pt's judgment and impulse control are poor, though the pt does have some insight into his mental health and substance abuse problems. Pt says he cannot contract for safety.   Associated Signs/Symptoms: Depression Symptoms:  depressed mood, feelings of worthlessness/guilt, hopelessness, suicidal thoughts with specific plan, (Hypo) Manic Symptoms:  N/A Anxiety Symptoms:  Excessive  Worry, Psychotic Symptoms:  Hallucinations: Visual PTSD Symptoms: Negative Total Time spent  with patient: 30 minutes  Past Psychiatric History: ( See HPI)  Is the patient at risk to self? Yes.    Has the patient been a risk to self in the past 6 months? Yes.    Has the patient been a risk to self within the distant past? Yes.    Is the patient a risk to others? Yes.    Has the patient been a risk to others in the past 6 months? Yes.    Has the patient been a risk to others within the distant past? Yes.     Prior Inpatient Therapy:   Prior Outpatient Therapy:    Alcohol Screening: 1. How often do you have a drink containing alcohol?: 4 or more times a week 2. How many drinks containing alcohol do you have on a typical day when you are drinking?: 7, 8, or 9 3. How often do you have six or more drinks on one occasion?: Daily or almost daily Preliminary Score: 7 4. How often during the last year have you found that you were not able to stop drinking once you had started?: Daily or almost daily 5. How often during the last year have you failed to do what was normally expected from you becasue of drinking?: Daily or almost daily 6. How often during the last year have you needed a first drink in the morning to get yourself going after a heavy drinking session?: Daily or almost daily 7. How often during the last year have you had a feeling of guilt of remorse after drinking?: Daily or almost daily 8. How often during the last year have you been unable to remember what happened the night before because you had been drinking?: Daily or almost daily 9. Have you or someone else been injured as a result of your drinking?: Yes, during the last year 10. Has a relative or friend or a doctor or another health worker been concerned about your drinking or suggested you cut down?: Yes, during the last year Alcohol Use Disorder Identification Test Final Score (AUDIT): 39 Brief Intervention: Yes Substance  Abuse History in the last 12 months:  Yes.   Consequences of Substance Abuse: Financial and legal matters Previous Psychotropic Medications: Yes  Psychological Evaluations: Yes  Past Medical History:  Past Medical History  Diagnosis Date  . Diabetes mellitus without complication [facility])     Past Surgical History  Procedure Laterality Date  . Finger surgery     Family History:  Family History  Problem Relation Age of Onset  . Diabetes Other   . Hypertension Other    Family Psychiatric  History: Unremarkable Tobacco Screening:Positive Smoker Social History:  History  Alcohol Use  . Yes     History  Drug Use  . 3.00 per week  . Special: Cocaine, "Crack" cocaine    Additional Social History:                           Allergies:  No Known Allergies Lab Results:  Results for orders placed or performed during the hospital encounter of 06/25/15 (from the past 48 hour(s))  Glucose, capillary     Status: Abnormal   Collection Time: 06/26/15  1:47 AM  Result Value Ref Range   Glucose-Capillary 353 (H) 65 - 99 mg/dL    Blood Alcohol level:  Lab Results  Component Value Date   ETH <5 06/25/2015   ETH <5 06/06/2015    Metabolic  Disorder Labs:  Lab Results  Component Value Date   HGBA1C 14.3* 05/29/2015   MPG 364 05/29/2015   MPG 272* 12/07/2012   No results found for: PROLACTIN Lab Results  Component Value Date   CHOL 173 08/16/2008   TRIG 34 08/16/2008   HDL 76 08/16/2008   CHOLHDL 2.3 Ratio 08/16/2008   VLDL 7 08/16/2008   LDLCALC 90 08/16/2008   LDLCALC 98 12/20/2007    Current Medications: Current Facility-Administered Medications  Medication Dose Route Frequency Provider Last Rate Last Dose  . acetaminophen (TYLENOL) tablet 650 mg  650 mg Oral Q4H PRN Earney Navy, NP      . alum & mag hydroxide-simeth (MAALOX/MYLANTA) 200-200-20 MG/5ML suspension 30 mL  30 mL Oral PRN Earney Navy, NP      . ibuprofen (ADVIL,MOTRIN) tablet 600 mg   600 mg Oral Q8H PRN Earney Navy, NP      . insulin aspart (novoLOG) injection 0-15 Units  0-15 Units Subcutaneous TID WC Spencer E Simon, PA-C      . insulin aspart (novoLOG) injection 0-5 Units  0-5 Units Subcutaneous QHS Spencer E Simon, PA-C      . insulin glargine (LANTUS) injection 10 Units  10 Units Subcutaneous QHS Kerry Hough, PA-C      . LORazepam (ATIVAN) tablet 1 mg  1 mg Oral Q8H PRN Earney Navy, NP      . metFORMIN (GLUCOPHAGE) tablet 1,000 mg  1,000 mg Oral BID WC Spencer E Simon, PA-C      . nicotine (NICODERM CQ - dosed in mg/24 hours) patch 21 mg  21 mg Transdermal Daily Earney Navy, NP      . ondansetron (ZOFRAN) tablet 4 mg  4 mg Oral Q8H PRN Earney Navy, NP      . traZODone (DESYREL) tablet 50 mg  50 mg Oral QHS,MR X 1 Kerry Hough, PA-C   50 mg at 06/25/15 2208   PTA Medications: Prescriptions prior to admission  Medication Sig Dispense Refill Last Dose  . buPROPion (WELLBUTRIN XL) 150 MG 24 hr tablet Take 1 tablet (150 mg total) by mouth daily. 14 tablet 0 06/24/2015 at Unknown time  . hydrOXYzine (ATARAX/VISTARIL) 25 MG tablet Take 1 tablet (25 mg total) by mouth every 6 (six) hours as needed (sleep). & anxiety 60 tablet 0 Past Month at Unknown time  . insulin aspart protamine- aspart (NOVOLOG MIX 70/30) (70-30) 100 UNIT/ML injection Inject 0.1 mLs (10 Units total) into the skin daily with supper. For diabetes control 10 mL 1 06/23/2015 at Unknown time  . meloxicam (MOBIC) 15 MG tablet Take 1 tablet (15 mg total) by mouth daily. For arthritic pain (Patient taking differently: Take 15 mg by mouth daily as needed for pain. For arthritic pain) 30 tablet 1 Past Month at Unknown time  . metFORMIN (GLUCOPHAGE) 1000 MG tablet Take 1 tablet (1,000 mg total) by mouth 2 (two) times daily with a meal. For diabetes management 60 tablet 1 06/24/2015 at Unknown time  . nicotine (NICODERM CQ - DOSED IN MG/24 HOURS) 21 mg/24hr patch Place 1 patch (21 mg  total) onto the skin daily at 6 (six) AM. For smoking cessation (Patient not taking: Reported on 06/25/2015) 28 patch 0 Not Taking at Unknown time  . traZODone (DESYREL) 50 MG tablet Take 1 tablet (50 mg total) by mouth at bedtime and may repeat dose one time if needed. For sleep (Patient not taking: Reported on 06/25/2015) 60 tablet  0 Not Taking at Unknown time    Musculoskeletal: Strength & Muscle Tone: decreased Gait & Station: unsteady Patient leans: Backward  Psychiatric Specialty Exam: Physical Exam  Nursing note and vitals reviewed. Constitutional: He is oriented to person, place, and time. Vital signs are normal. He appears cachectic.  HENT:  Head: Normocephalic.  Eyes: Pupils are equal, round, and reactive to light.  Musculoskeletal: He exhibits no edema.  Neurological: He is alert and oriented to person, place, and time. No cranial nerve deficit.  Skin: Skin is warm and dry.    Review of Systems  Psychiatric/Behavioral: Positive for depression, suicidal ideas and substance abuse. The patient is nervous/anxious.   All other systems reviewed and are negative.   Blood pressure 126/74, pulse 91, temperature 98.1 F (36.7 C), temperature source Oral, resp. rate 18, height 6' (1.829 m), weight 65.431 kg (144 lb 4 oz), SpO2 100 %.Body mass index is 19.56 kg/(m^2).  General Appearance: Disheveled  Eye Contact::  Poor  Speech:  Garbled  Volume:  Decreased  Mood:  Depressed  Affect:  Congruent  Thought Process:  Circumstantial  Orientation:  Full (Time, Place, and Person)  Thought Content:  Hallucinations: Visual  Suicidal Thoughts:  Yes.  with intent/plan  Homicidal Thoughts:  No  Memory:  Immediate;   Fair  Judgement:  Impaired  Insight:  Lacking  Psychomotor Activity:  Negative  Concentration:  Poor  Recall:  Poor  Fund of Knowledge:Poor  Language: Fair  Akathisia:  Negative  Handed:  Right  AIMS (if indicated):     Assets:  Desire for Improvement  ADL's:  Intact   Cognition: WNL  Sleep:        Treatment Plan Summary: Plan Admitted to Observation Unit for crises intervention, safety and stabilization. Alcohol detox protocol initiated, further plans per TTS to follow  Observation Level/Precautions:  Continuous Observation  Laboratory:    Psychotherapy:    Medications:    Consultations:    Discharge Concerns:    Estimated LOS:  Other:       SIMON,SPENCER E, PA-C 3/16/20172:30 AM Patient admitted to [facility] OBS unit for stabilization and treatment

## 2015-06-26 NOTE — [facility] Counselor (Signed)
Shayla from [facility] called and spoke with patient for screening, requested fax of glucose levels and vitals. Vitals and Glucose faxed this a.m. Laurence Crofford K. Sherlon Handing, LPC-A, [facility]  Counselor 06/26/2015 8:35 AM

## 2015-12-04 ENCOUNTER — Encounter (HOSPITAL_COMMUNITY): Payer: Self-pay | Admitting: *Deleted

## 2015-12-04 ENCOUNTER — Emergency Department (HOSPITAL_COMMUNITY)
Admission: EM | Admit: 2015-12-04 | Discharge: 2015-12-05 | Disposition: A | Discharge status: Other Acute Inpatient Hospital | Payer: Self-pay | Attending: Emergency Medicine | Admitting: Emergency Medicine

## 2015-12-04 DIAGNOSIS — F32A Depression, unspecified: Secondary | ICD-10-CM

## 2015-12-04 DIAGNOSIS — Z794 Long term (current) use of insulin: Secondary | ICD-10-CM | POA: Insufficient documentation

## 2015-12-04 DIAGNOSIS — R45851 Suicidal ideations: Secondary | ICD-10-CM

## 2015-12-04 DIAGNOSIS — F17210 Nicotine dependence, cigarettes, uncomplicated: Secondary | ICD-10-CM | POA: Insufficient documentation

## 2015-12-04 DIAGNOSIS — E1165 Type 2 diabetes mellitus with hyperglycemia: Secondary | ICD-10-CM | POA: Insufficient documentation

## 2015-12-04 DIAGNOSIS — I10 Essential (primary) hypertension: Secondary | ICD-10-CM | POA: Insufficient documentation

## 2015-12-04 DIAGNOSIS — Z7984 Long term (current) use of oral hypoglycemic drugs: Secondary | ICD-10-CM | POA: Insufficient documentation

## 2015-12-04 DIAGNOSIS — F329 Major depressive disorder, single episode, unspecified: Secondary | ICD-10-CM | POA: Insufficient documentation

## 2015-12-04 DIAGNOSIS — Z79899 Other long term (current) drug therapy: Secondary | ICD-10-CM | POA: Insufficient documentation

## 2015-12-04 DIAGNOSIS — R739 Hyperglycemia, unspecified: Secondary | ICD-10-CM

## 2015-12-04 DIAGNOSIS — F1410 Cocaine abuse, uncomplicated: Secondary | ICD-10-CM | POA: Insufficient documentation

## 2015-12-04 LAB — RAPID URINE DRUG SCREEN, HOSP PERFORMED
Amphetamines: NOT DETECTED
Barbiturates: NOT DETECTED
Benzodiazepines: NOT DETECTED
Cocaine: POSITIVE — AB
Opiates: NOT DETECTED
Tetrahydrocannabinol: NOT DETECTED

## 2015-12-04 LAB — CBC
HCT: 42.3 % (ref 39.0–52.0)
Hemoglobin: 14.1 g/dL (ref 13.0–17.0)
MCH: 28.2 pg (ref 26.0–34.0)
MCHC: 33.3 g/dL (ref 30.0–36.0)
MCV: 84.6 fL (ref 78.0–100.0)
Platelets: 249 K/uL (ref 150–400)
RBC: 5 MIL/uL (ref 4.22–5.81)
RDW: 13.6 % (ref 11.5–15.5)
WBC: 5 K/uL (ref 4.0–10.5)

## 2015-12-04 LAB — COMPREHENSIVE METABOLIC PANEL WITH GFR
ALT: 25 U/L (ref 17–63)
AST: 24 U/L (ref 15–41)
Albumin: 3.7 g/dL (ref 3.5–5.0)
Alkaline Phosphatase: 122 U/L (ref 38–126)
Anion gap: 9 (ref 5–15)
BUN: 14 mg/dL (ref 6–20)
CO2: 27 mmol/L (ref 22–32)
Calcium: 9 mg/dL (ref 8.9–10.3)
Chloride: 87 mmol/L — ABNORMAL LOW (ref 101–111)
Creatinine, Ser: 1.24 mg/dL (ref 0.61–1.24)
GFR calc Af Amer: >60 mL/min
GFR calc non Af Amer: >60 mL/min
Glucose, Bld: 720 mg/dL (ref 65–99)
Potassium: 4.2 mmol/L (ref 3.5–5.1)
Sodium: 123 mmol/L — ABNORMAL LOW (ref 135–145)
Total Bilirubin: 0.7 mg/dL (ref 0.3–1.2)
Total Protein: 6.5 g/dL (ref 6.5–8.1)

## 2015-12-04 LAB — CBG MONITORING, ED
GLUCOSE-CAPILLARY: 183 mg/dL — AB (ref 65–99)
Glucose-Capillary: >600 mg/dL (ref 65–99)
Glucose-Capillary: >600 mg/dL (ref 65–99)
Glucose-Capillary: 332 mg/dL — ABNORMAL HIGH (ref 65–99)
Glucose-Capillary: 77 mg/dL (ref 65–99)
Glucose-Capillary: 370 mg/dL — ABNORMAL HIGH (ref 65–99)
Glucose-Capillary: 263 mg/dL — ABNORMAL HIGH (ref 65–99)

## 2015-12-04 LAB — ACETAMINOPHEN LEVEL

## 2015-12-04 LAB — SALICYLATE LEVEL: Salicylate Lvl: <4 mg/dL (ref 2.8–30.0)

## 2015-12-04 LAB — LIPASE, BLOOD: Lipase: 50 U/L (ref 11–51)

## 2015-12-04 LAB — ETHANOL: Alcohol, Ethyl (B): <5 mg/dL

## 2015-12-04 MED ORDER — SODIUM CHLORIDE 0.9 % IV BOLUS (SEPSIS)
1000.0000 mL | Freq: Once | INTRAVENOUS | Status: AC
  Administered 2015-12-04: 1000 mL via INTRAVENOUS

## 2015-12-04 MED ORDER — INSULIN ASPART 100 UNIT/ML ~~LOC~~ SOLN
15.0000 [IU] | Freq: Once | SUBCUTANEOUS | Status: AC
  Administered 2015-12-04: 15 [IU] via SUBCUTANEOUS
  Filled 2015-12-04: qty 1

## 2015-12-04 MED ORDER — THIAMINE HCL 100 MG/ML IJ SOLN: 100.0000 mg | Freq: Every day | INTRAMUSCULAR | Status: DC

## 2015-12-04 MED ORDER — ALUM & MAG HYDROXIDE-SIMETH 200-200-20 MG/5ML PO SUSP: 30.0000 mL | ORAL | Status: DC | PRN

## 2015-12-04 MED ORDER — VITAMIN B-1 100 MG PO TABS
100.0000 mg | ORAL_TABLET | Freq: Every day | ORAL | Status: DC
  Administered 2015-12-04 – 2015-12-05 (×2): 100 mg via ORAL
  Filled 2015-12-04 (×2): qty 1

## 2015-12-04 MED ORDER — INSULIN GLARGINE 100 UNIT/ML ~~LOC~~ SOLN
10.0000 [IU] | Freq: Every day | SUBCUTANEOUS | Status: DC
  Administered 2015-12-04: 10 [IU] via SUBCUTANEOUS
  Filled 2015-12-04 (×3): qty 0.1

## 2015-12-04 MED ORDER — NICOTINE 21 MG/24HR TD PT24
21.0000 mg | MEDICATED_PATCH | Freq: Every day | TRANSDERMAL | Status: DC
  Filled 2015-12-04 (×2): qty 1

## 2015-12-04 MED ORDER — BUPROPION HCL ER (XL) 150 MG PO TB24
150.0000 mg | ORAL_TABLET | Freq: Every day | ORAL | Status: DC
Start: 2015-12-04 — End: 2015-12-05
  Administered 2015-12-04 – 2015-12-05 (×2): 150 mg via ORAL
  Filled 2015-12-04 (×2): qty 1

## 2015-12-04 MED ORDER — INSULIN ASPART 100 UNIT/ML ~~LOC~~ SOLN
10.0000 [IU] | Freq: Once | SUBCUTANEOUS | Status: AC
  Administered 2015-12-04: 10 [IU] via INTRAVENOUS
  Filled 2015-12-04: qty 1

## 2015-12-04 MED ORDER — TRAZODONE HCL 50 MG PO TABS: 50.0000 mg | ORAL_TABLET | Freq: Every evening | ORAL | Status: DC | PRN

## 2015-12-04 MED ORDER — INSULIN ASPART 100 UNIT/ML ~~LOC~~ SOLN
0.0000 [IU] | SUBCUTANEOUS | Status: DC
  Administered 2015-12-04: 2 [IU] via SUBCUTANEOUS
  Administered 2015-12-04: 3 [IU] via SUBCUTANEOUS
  Administered 2015-12-04: 15 [IU] via SUBCUTANEOUS
  Administered 2015-12-05: 2 [IU] via SUBCUTANEOUS
  Administered 2015-12-05: 8 [IU] via SUBCUTANEOUS
  Administered 2015-12-05: 3 [IU] via SUBCUTANEOUS
  Filled 2015-12-04 (×6): qty 1

## 2015-12-04 MED ORDER — IBUPROFEN 400 MG PO TABS: 600.0000 mg | ORAL_TABLET | Freq: Three times a day (TID) | ORAL | Status: DC | PRN

## 2015-12-04 MED ORDER — LORAZEPAM 1 MG PO TABS: 0.0000 mg | ORAL_TABLET | Freq: Two times a day (BID) | ORAL | Status: DC

## 2015-12-04 MED ORDER — ACETAMINOPHEN 325 MG PO TABS: 650.0000 mg | ORAL_TABLET | ORAL | Status: DC | PRN

## 2015-12-04 MED ORDER — ONDANSETRON HCL 4 MG PO TABS: 4.0000 mg | ORAL_TABLET | Freq: Three times a day (TID) | ORAL | Status: DC | PRN

## 2015-12-04 MED ORDER — METFORMIN HCL 500 MG PO TABS
1000.0000 mg | ORAL_TABLET | Freq: Two times a day (BID) | ORAL | Status: DC
  Administered 2015-12-04 – 2015-12-05 (×4): 1000 mg via ORAL
  Filled 2015-12-04 (×4): qty 2

## 2015-12-04 MED ORDER — LORAZEPAM 1 MG PO TABS
0.0000 mg | ORAL_TABLET | Freq: Four times a day (QID) | ORAL | Status: DC
  Administered 2015-12-05: 1 mg via ORAL
  Filled 2015-12-04: qty 1

## 2015-12-04 NOTE — ED Notes (Signed)
EDP aware of critical Glucose

## 2015-12-04 NOTE — ED Notes (Signed)
CBG 143  

## 2015-12-04 NOTE — ED Notes (Signed)
Security paged to wand pt

## 2015-12-04 NOTE — BH Assessment (Signed)
Tele Assessment Note Pt presents voluntarily to [facility] with SI and plan to walk into traffic. Pt is cooperative and oriented x 4. He is wearing scrubs and keeps his eyes closed. Pt says he hasn't slept since 11/28/15. Pt reports he was walking [place] [address] early this am and wanted to jump in front of a car to kill himself. Pt say he called a friend to bring him to the ED. He says, "I'm tired of this. I'm tired of hurting my family." Pt reports he relapsed on crack cocaine in 2013 after 5 yrs clean and sober. Pt says he has used approx $100 worth crack daily for past 3 weeks. Last use was 12/03/15. He reports depressed mood with fatigue, poor appeitite (45 lbs weight loss), insomnia, irritability, worthlessness, guilt, social isolation and loss of interest in usual pleasures. Pt denies HI and [facility]. No delusions noted. Per chart review, pt was been admitted to [facility] several times from 2012 to 2017. He most recently was admitted to [facility] OBS unit in March 2017. Pt reports he is living on the streets. He said he may be able to stay with his wife, but things are very tense at home d/t his drug use. Pt denies hx of trauma or abuse. He has court date 01/06/16 for vehicle larceny which he says was misunderstanding. Pt cries during assessment and he also appears slightly anxious. Pt says he can not contract for safety.   James [name] is an 55 y.o. male.   Diagnosis:  Substance Induced Depressive Disorder Cocaine Use Disorder, Severe   Past Medical History:  Past Medical History:  Diagnosis Date  . Diabetes mellitus without complication [facility])     Past Surgical History:  Procedure Laterality Date  . FINGER SURGERY      Family History:  Family History  Problem Relation Age of Onset  . Diabetes Other   . Hypertension Other     Social History:  reports that he has been smoking Cigarettes.  He has a 11.50 pack-year smoking history. He has never used smokeless tobacco. He reports that he drinks  alcohol. He reports that he uses drugs, including Cocaine and "Crack" cocaine, about 3 times per week.  Additional Social History:  Alcohol / Drug Use Pain Medications: pt denies abuse - see pta meds list Prescriptions: pt denies abuse - see pta meds list Over the Counter: pt denies abuse - see pta meds list History of alcohol / drug use?: Yes Longest period of sobriety (when/how long): 5 yrs - until 2013 Negative Consequences of Use: Financial, Legal, Personal relationships, Work / School Substance #1 Name of Substance 1: crack cocaine  1 - Age of First Use: 16 1 - Amount (size/oz): $100 1 - Frequency: daily 1 - Duration: for past 3 weeks 1 - Last Use / Amount: 12/03/15 -   CIWA: CIWA-Ar BP: 111/68 Pulse Rate: 61 Nausea and Vomiting: no nausea and no vomiting Tactile Disturbances: none Tremor: no tremor Auditory Disturbances: mild harshness or ability to frighten Paroxysmal Sweats: no sweat visible Visual Disturbances: not present Anxiety: no anxiety, at ease Headache, Fullness in Head: none present Agitation: normal activity Orientation and Clouding of Sensorium: oriented and can do serial additions CIWA-Ar Total: 2 COWS:    PATIENT STRENGTHS: (choose at least two) Average or above average intelligence Capable of independent living Communication skills Physical Health  Allergies: No Known Allergies  Home Medications:  (Not in a hospital admission)  OB/GYN Status:  No LMP for  male patient.  General Assessment Data Location of Assessment: [facility] ED TTS Assessment: In system Is this a Tele or Face-to-Face Assessment?: Tele Assessment Is this an Initial Assessment or a Re-assessment for this encounter?: Initial Assessment Marital status: Married [place] name: n/a Is patient pregnant?: No Pregnancy Status: No Living Arrangements:  (living on street) Can pt return to current living arrangement?: Yes Admission Status: Voluntary Is patient capable of signing voluntary  admission?: Yes Referral Source: Self/Family/Friend     Crisis Care Plan Living Arrangements:  (living on street) Name of Psychiatrist: none Name of Therapist: none  Education Status Is patient currently in school?: No Highest grade of school patient has completed: 12  Risk to self with the past 6 months Suicidal Ideation: Yes-Currently Present Has patient been a risk to self within the past 6 months prior to admission? : Yes Suicidal Intent: Yes-Currently Present Has patient had any suicidal intent within the past 6 months prior to admission? : Yes Is patient at risk for suicide?: Yes Suicidal Plan?: Yes-Currently Present Has patient had any suicidal plan within the past 6 months prior to admission? : Yes Specify Current Suicidal Plan: to walk [place] traffic [place] [address] Access to Means: Yes Specify Access to Suicidal Means: access to [address] traffic What has been your use of drugs/alcohol within the last 12 months?: daily crack use for 3 weeks Previous Attempts/Gestures: Yes How many times?: 3 Other Self Harm Risks: none Triggers for Past Attempts:  (substance use) Intentional Self Injurious Behavior: None Family Suicide History: Yes (oldest brother attempted suicide) Recent stressful life event(s): Other (Comment) (substance abuse and family upset by it) Persecutory voices/beliefs?: No Depression: Yes Depression Symptoms: Insomnia, Tearfulness, Isolating, Fatigue, Guilt, Loss of interest in usual pleasures, Feeling worthless/self pity, Feeling angry/irritable (decreased appetite) Substance abuse history and/or treatment for substance abuse?: Yes Suicide prevention information given to non-admitted patients: Not applicable  Risk to Others within the past 6 months Homicidal Ideation: No Does patient have any lifetime risk of violence toward others beyond the six months prior to admission? : No Thoughts of Harm to Others: No Current Homicidal Intent: No Current Homicidal Plan:  No Access to Homicidal Means: No Identified Victim: none History of harm to others?: No Assessment of Violence: None Noted Violent Behavior Description: pt denies hx violence - is calm Does patient have access to weapons?: No Criminal Charges Pending?: Yes Describe Pending Criminal Charges: larceny vehicle Does patient have a court date: Yes Court Date: 01/06/16 Is patient on probation?: No  Psychosis Hallucinations: None noted Delusions: None noted  Mental Status Report Appearance/Hygiene: In scrubs Eye Contact: Poor (pt keeps eyes closed) Motor Activity: Freedom of movement Speech: Logical/coherent Level of Consciousness: Alert Mood: Depressed, Sad, Irritable, Anhedonia, Worthless, low self-esteem Affect: Appropriate to circumstance, Anxious, Sad, Depressed Anxiety Level: Minimal Thought Processes: Relevant, Coherent Judgement: Unimpaired Orientation: Person, Place, Situation, Time Obsessive Compulsive Thoughts/Behaviors: None  Cognitive Functioning Concentration: Decreased Memory: Remote Impaired, Recent Impaired IQ: Average Insight: Good Impulse Control: Poor Appetite: Poor Weight Loss: 45 Sleep: Decreased Total Hours of Sleep: 0 Vegetative Symptoms: None  ADLScreening [facility] Assessment Services) Patient's cognitive ability adequate to safely complete daily activities?: Yes Patient able to express need for assistance with ADLs?: Yes Independently performs ADLs?: Yes (appropriate for developmental age)  Prior Inpatient Therapy Prior Inpatient Therapy: Yes Prior Therapy Dates: 2012 - 2017 Prior Therapy Facilty/Provider(s): [facility](multiple), [facility] Reason for Treatment: substance abuse  Prior Outpatient Therapy Prior Outpatient Therapy: No Does patient have an ACCT team?:  No Does patient have Intensive In-House Services?  : No Does patient have [facility] services? : No Does patient have P4CC services?: No  ADL Screening (condition at time of  admission) Patient's cognitive ability adequate to safely complete daily activities?: Yes Is the patient deaf or have difficulty hearing?: No Does the patient have difficulty seeing, even when wearing glasses/contacts?: No Does the patient have difficulty concentrating, remembering, or making decisions?: Yes Patient able to express need for assistance with ADLs?: Yes Does the patient have difficulty dressing or bathing?: No Independently performs ADLs?: Yes (appropriate for developmental age) Does the patient have difficulty walking or climbing stairs?: No Weakness of Legs: None Weakness of Arms/Hands: None  Home Assistive Devices/Equipment Home Assistive Devices/Equipment: None    Abuse/Neglect Assessment (Assessment to be complete while patient is alone) Physical Abuse: Denies Verbal Abuse: Denies Sexual Abuse: Denies Exploitation of patient/patient's resources: Denies Self-Neglect: Denies     Merchant navy officerAdvance Directives (For Healthcare) Does patient have an advance directive?: No Would patient like information on creating an advanced directive?: No - patient declined information    Additional Information 1:1 In Past 12 Months?: No CIRT Risk: No Elopement Risk: No Does patient have medical clearance?: No     Disposition:  Disposition Initial Assessment Completed for this Encounter: Yes Disposition of Patient: Other dispositions Other disposition(s):  (withrow DNP rec [facility] OBS but pt's blood sugar not stable)   Writer ran pt by Claudette Headonrad Withrow DNP who [facility]. However, pt blood sugar not stable. Pt's must to less than 350 for 24 hrs.   Chivonne Rascon P 12/04/2015 6:00 PM

## 2015-12-04 NOTE — ED Notes (Signed)
Report received from [place], [place]. Pt moved to exam room 29. Sitter at bedside. Pt resting quietly at the time. No signs of distress noted.

## 2015-12-04 NOTE — ED Notes (Signed)
Pt talking to TTS 

## 2015-12-04 NOTE — ED Notes (Signed)
Staffing contacted for sitter 

## 2015-12-04 NOTE — ED Triage Notes (Signed)
Pt c/o suicidal ideation with plan to jump into traffic. Was [place] [address] when pt had thoughts of walking into traffic. Reports thoughts of hurting self comes after cocaine use. Last use was tonight. Also c/o frequent urination

## 2015-12-04 NOTE — ED Notes (Signed)
Email sent to shared services for breakfast tray.

## 2015-12-04 NOTE — ED Notes (Signed)
Pt complained that he was unable to eat meal due it not addressing diabetic concerns. Snack provided.

## 2015-12-04 NOTE — ED Notes (Signed)
Pt resting quietly at the time. Sitter remains at bedside. NAD

## 2015-12-04 NOTE — ED Provider Notes (Signed)
[facility]-EMERGENCY DEPT Provider Note   CSN: [id number] Arrival date & time: 12/04/15  0200  By signing my name below, I, Majel Homer, attest that this documentation has been prepared under the direction and in the presence of Gilda Crease, MD . Electronically Signed: Majel Homer, Scribe. 12/04/2015. 3:03 AM.  History   Chief Complaint Chief Complaint  Patient presents with  . Suicidal   The history is provided by the patient. No language interpreter was used.   HPI Comments: James [name] is a 55 y.o. male with PMHx of substance induced mood disorder, cocaine abuse and alcohol dependence, who presents to the Emergency Department for an evaluation of SI s/p an urge to jump into incoming traffic tonight. Pt reports he has been feeling "really depressed" for 4 days; he states the onset of his suicidal thoughts began after cocaine use tonight. Pt reports hx of SI and notes he was recently admitted on 06/25/15 due to this. Pt also notes hx of DM but states he does not check his blood sugar often; he complains of mild epigastric abdominal pain and diarrhea over the past few days. He denies nausea and vomiting.   Past Medical History:  Diagnosis Date  . Diabetes mellitus without complication [facility])    Patient Active Problem List   Diagnosis Date Noted  . MDD (major depressive disorder), recurrent severe, without psychosis (HCC) 06/26/2015  . Substance induced mood disorder (HCC)   . Cocaine use disorder, moderate, dependence (HCC)   . Alcohol use disorder, moderate, dependence (HCC)   . Cocaine use disorder, severe, dependence (HCC) 06/25/2015  . Cocaine-induced mood disorder (HCC) 06/06/2015  . 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 Major depressive disorder, recurrent episode (HCC) 05/28/2015  . Suicidal ideation   . Alcohol  withdrawal (HCC) 12/07/2012  . Cocaine dependence (HCC) 03/01/2012  . Alcohol abuse 03/01/2012  . Major depression (HCC) 03/01/2012  . SPONDYLOSIS, CERVICAL, WITH RADICULOPATHY 02/25/2009  . MUSCLE SPASM, BACK 02/12/2009  . LEUKOPENIA, MILD 08/19/2008  . ECZEMA, HANDS 08/06/2008  . HYPERCHOLESTEROLEMIA 05/09/2007  . SINUSITIS, CHRONIC 05/01/2007  . ALLERGIC RHINITIS 12/21/2006  . DIABETES MELLITUS, TYPE II 12/07/2006  . HYPERTENSION 12/07/2006    Past Surgical History:  Procedure Laterality Date  . FINGER SURGERY      Home Medications    Prior to Admission medications   Medication Sig Start Date End Date Taking? Authorizing Provider  buPROPion (WELLBUTRIN XL) 150 MG 24 hr tablet Take 1 tablet (150 mg total) by mouth daily. 06/26/15   Beau Fanny, FNP  insulin aspart (NOVOLOG) 100 UNIT/ML injection Inject 0-15 Units into the skin 3 (three) times daily with meals. SEE SLIDING SCALE FROM [facility] 06/26/15   Beau Fanny, FNP  insulin aspart (NOVOLOG) 100 UNIT/ML injection Inject 0-5 Units into the skin at bedtime. 06/26/15   Beau Fanny, FNP  insulin glargine (LANTUS) 100 UNIT/ML injection Inject 0.1 mLs (10 Units total) into the skin at bedtime. 06/26/15   Beau Fanny, FNP  metFORMIN (GLUCOPHAGE) 1000 MG tablet Take 1 tablet (1,000 mg total) by mouth 2 (two) times daily with a meal. For diabetes management 06/26/15   Beau Fanny, FNP  nicotine (NICODERM CQ - DOSED IN MG/24 HOURS) 21 mg/24hr patch Place 1 patch (21 mg total) onto the skin daily at 6 (six) AM. For smoking cessation 06/26/15   Beau Fanny,  FNP  traZODone (DESYREL) 50 MG tablet Take 1 tablet (50 mg total) by mouth at bedtime as needed for sleep. 06/26/15   Beau FannyJohn C Withrow, FNP    Family History Family History  Problem Relation Age of Onset  . Diabetes Other   . Hypertension Other     Social History Social History  Substance Use Topics  . Smoking status: Current Every Day Smoker    Packs/day: 0.50    Years:  23.00    Types: Cigarettes  . Smokeless tobacco: Never Used  . Alcohol use Yes     Allergies   Review of patient's allergies indicates no known allergies.   Review of Systems Review of Systems  Gastrointestinal: Positive for abdominal pain and diarrhea. Negative for nausea and vomiting.  Psychiatric/Behavioral: Positive for suicidal ideas.   Physical Exam Updated Vital Signs BP 115/69 (BP Location: Left Arm)   Pulse 107   Temp 97.4 F (36.3 C) (Oral)   Resp 19   Ht 6' (1.829 m)   Wt 134 lb 9.6 oz (61.1 kg)   SpO2 99%   BMI 18.26 kg/m   Physical Exam  Constitutional: He is oriented to person, place, and time. He appears well-developed and well-nourished. No distress.  HENT:  Head: Normocephalic and atraumatic.  Right Ear: Hearing normal.  Left Ear: Hearing normal.  Nose: Nose normal.  Mouth/Throat: Oropharynx is clear and moist and mucous membranes are normal.  Dry mucous membranes  Eyes: Conjunctivae and EOM are normal. Pupils are equal, round, and reactive to light.  Neck: Normal range of motion. Neck supple.  Cardiovascular: Regular rhythm, S1 normal and S2 normal.  Exam reveals no gallop and no friction rub.   No murmur heard. Pulmonary/Chest: Effort normal and breath sounds normal. No respiratory distress. He exhibits no tenderness.  Abdominal: Soft. Normal appearance and bowel sounds are normal. There is no hepatosplenomegaly. There is no tenderness. There is no rebound, no guarding, no tenderness at McBurney's point and negative Murphy's sign. No hernia.  Mild epigastric tenderness  Musculoskeletal: Normal range of motion.  Neurological: He is alert and oriented to person, place, and time. He has normal strength. No cranial nerve deficit or sensory deficit. Coordination normal. GCS eye subscore is 4. GCS verbal subscore is 5. GCS motor subscore is 6.  Skin: Skin is warm, dry and intact. No rash noted. No cyanosis.  Psychiatric: He has a normal mood and affect.  His speech is normal and behavior is normal. Thought content normal.  Nursing note and vitals reviewed.  ED Treatments / Results  Labs (all labs ordered are listed, but only abnormal results are displayed) Labs Reviewed  COMPREHENSIVE METABOLIC PANEL - Abnormal; Notable for the following:       Result Value   Sodium 123 (*)    Chloride 87 (*)    Glucose, Bld 720 (*)    All other components within normal limits  URINE RAPID DRUG SCREEN, HOSP PERFORMED - Abnormal; Notable for the following:    Cocaine POSITIVE (*)    All other components within normal limits  ACETAMINOPHEN LEVEL - Abnormal; Notable for the following:    Acetaminophen (Tylenol), Serum <10 (*)    All other components within normal limits  CBG MONITORING, ED - Abnormal; Notable for the following:    Glucose-Capillary >600 (*)    All other components within normal limits  CBG MONITORING, ED - Abnormal; Notable for the following:    Glucose-Capillary >600 (*)    All other  components within normal limits  CBG MONITORING, ED - Abnormal; Notable for the following:    Glucose-Capillary 332 (*)    All other components within normal limits  ETHANOL  CBC  SALICYLATE LEVEL  LIPASE, BLOOD    EKG  EKG Interpretation None       Radiology No results found.  Procedures Procedures  DIAGNOSTIC STUDIES:  Oxygen Saturation is 99% on RA, normal by my interpretation.    COORDINATION OF CARE:  2:56 AM Discussed treatment plan with pt at bedside and pt agreed to plan.  Medications Ordered in ED Medications  sodium chloride 0.9 % bolus 1,000 mL (not administered)  insulin aspart (novoLOG) injection 15 Units (not administered)  insulin aspart (novoLOG) injection 0-15 Units (not administered)  sodium chloride 0.9 % bolus 1,000 mL (1,000 mLs Intravenous New Bag/Given 12/04/15 0320)  insulin aspart (novoLOG) injection 10 Units (10 Units Intravenous Given 12/04/15 0325)    Initial Impression / Assessment and Plan / ED  Course  I have reviewed the triage vital signs and the nursing notes.  Pertinent labs & imaging results that were available during my care of the patient were reviewed by me and considered in my medical decision making (see chart for details).  Clinical Course   Patient presented to the ER primarily for depression. Patient has a long history of depression and has been feeling suicidal. He did have an active plan to walk out into traffic. Patient is voluntary for psychiatric treatment.  Patient is also a diabetic, however. He has been off his medications for an unknown period of time. Blood sugar was markedly elevated, but there was no sign of diabetic ketoacidosis. Patient aggressively hydrated and treated with insulin and blood sugar has corrected. At this point patient is medically clear for psychiatric treatment.  I personally performed the services described in this documentation, which was scribed in my presence. The recorded information has been reviewed and is accurate.   Final Clinical Impressions(s) / ED Diagnoses   Final diagnoses:  Suicidal ideation  Depression  Hyperglycemia    New Prescriptions New Prescriptions   No medications on file     Gilda Crease, MD 12/04/15 0451

## 2015-12-04 NOTE — ED Notes (Signed)
Pt changed in paper scrubs

## 2015-12-05 ENCOUNTER — Observation Stay (HOSPITAL_COMMUNITY)
Admission: AD | Admit: 2015-12-05 | Discharge: 2015-12-06 | Disposition: A | Discharge status: Home / Self Care | Payer: Self-pay | Source: Intra-hospital | Attending: Psychiatry | Admitting: Psychiatry

## 2015-12-05 ENCOUNTER — Encounter (HOSPITAL_COMMUNITY): Payer: Self-pay | Admitting: *Deleted

## 2015-12-05 DIAGNOSIS — R45851 Suicidal ideations: Secondary | ICD-10-CM | POA: Insufficient documentation

## 2015-12-05 DIAGNOSIS — F14259 Cocaine dependence with cocaine-induced psychotic disorder, unspecified: Secondary | ICD-10-CM | POA: Insufficient documentation

## 2015-12-05 DIAGNOSIS — Z794 Long term (current) use of insulin: Secondary | ICD-10-CM | POA: Insufficient documentation

## 2015-12-05 DIAGNOSIS — F1424 Cocaine dependence with cocaine-induced mood disorder: Principal | ICD-10-CM | POA: Insufficient documentation

## 2015-12-05 DIAGNOSIS — Z590 Homelessness: Secondary | ICD-10-CM | POA: Insufficient documentation

## 2015-12-05 DIAGNOSIS — I10 Essential (primary) hypertension: Secondary | ICD-10-CM | POA: Insufficient documentation

## 2015-12-05 DIAGNOSIS — G4700 Insomnia, unspecified: Secondary | ICD-10-CM | POA: Insufficient documentation

## 2015-12-05 DIAGNOSIS — F1994 Other psychoactive substance use, unspecified with psychoactive substance-induced mood disorder: Secondary | ICD-10-CM | POA: Diagnosis present

## 2015-12-05 DIAGNOSIS — E119 Type 2 diabetes mellitus without complications: Secondary | ICD-10-CM | POA: Insufficient documentation

## 2015-12-05 DIAGNOSIS — F332 Major depressive disorder, recurrent severe without psychotic features: Secondary | ICD-10-CM | POA: Insufficient documentation

## 2015-12-05 DIAGNOSIS — Z9114 Patient's other noncompliance with medication regimen: Secondary | ICD-10-CM | POA: Insufficient documentation

## 2015-12-05 LAB — CBG MONITORING, ED
GLUCOSE-CAPILLARY: 150 mg/dL — AB (ref 65–99)
GLUCOSE-CAPILLARY: 281 mg/dL — AB (ref 65–99)
GLUCOSE-CAPILLARY: 185 mg/dL — AB (ref 65–99)
Glucose-Capillary: 115 mg/dL — ABNORMAL HIGH (ref 65–99)
Glucose-Capillary: 143 mg/dL — ABNORMAL HIGH (ref 65–99)
Glucose-Capillary: 152 mg/dL — ABNORMAL HIGH (ref 65–99)

## 2015-12-05 LAB — GLUCOSE, CAPILLARY
GLUCOSE-CAPILLARY: 116 mg/dL — AB (ref 65–99)
Glucose-Capillary: 301 mg/dL — ABNORMAL HIGH (ref 65–99)

## 2015-12-05 MED ORDER — IBUPROFEN 600 MG PO TABS: 600.0000 mg | ORAL_TABLET | Freq: Three times a day (TID) | ORAL | Status: DC | PRN

## 2015-12-05 MED ORDER — INSULIN ASPART 100 UNIT/ML ~~LOC~~ SOLN
0.0000 [IU] | SUBCUTANEOUS | Status: DC
Start: 2015-12-05 — End: 2015-12-06
  Administered 2015-12-05: 11 [IU] via SUBCUTANEOUS
  Administered 2015-12-06 (×2): 8 [IU] via SUBCUTANEOUS

## 2015-12-05 MED ORDER — METFORMIN HCL 500 MG PO TABS
1000.0000 mg | ORAL_TABLET | Freq: Two times a day (BID) | ORAL | Status: DC
  Administered 2015-12-06: 1000 mg via ORAL
  Filled 2015-12-05: qty 2

## 2015-12-05 MED ORDER — BUPROPION HCL ER (XL) 150 MG PO TB24
150.0000 mg | ORAL_TABLET | Freq: Every day | ORAL | Status: DC
  Administered 2015-12-06: 150 mg via ORAL
  Filled 2015-12-05: qty 1

## 2015-12-05 MED ORDER — ALUM & MAG HYDROXIDE-SIMETH 200-200-20 MG/5ML PO SUSP: 30.0000 mL | ORAL | Status: DC | PRN

## 2015-12-05 MED ORDER — TRAZODONE HCL 50 MG PO TABS
50.0000 mg | ORAL_TABLET | Freq: Every evening | ORAL | Status: DC | PRN
  Administered 2015-12-05: 50 mg via ORAL
  Filled 2015-12-05: qty 1

## 2015-12-05 MED ORDER — LORAZEPAM 1 MG PO TABS
0.0000 mg | ORAL_TABLET | Freq: Four times a day (QID) | ORAL | Status: AC
  Administered 2015-12-06: 1 mg via ORAL
  Administered 2015-12-06: 2 mg via ORAL
  Administered 2015-12-06: 1 mg via ORAL
  Filled 2015-12-05: qty 1
  Filled 2015-12-05: qty 2
  Filled 2015-12-05: qty 1

## 2015-12-05 MED ORDER — ACETAMINOPHEN 325 MG PO TABS: 650.0000 mg | ORAL_TABLET | ORAL | Status: DC | PRN

## 2015-12-05 MED ORDER — MAGNESIUM HYDROXIDE 400 MG/5ML PO SUSP: 30.0000 mL | Freq: Every day | ORAL | Status: DC | PRN

## 2015-12-05 MED ORDER — LORAZEPAM 1 MG PO TABS: 0.0000 mg | ORAL_TABLET | Freq: Two times a day (BID) | ORAL | Status: DC

## 2015-12-05 MED ORDER — NICOTINE 21 MG/24HR TD PT24: 21.0000 mg | MEDICATED_PATCH | Freq: Every day | TRANSDERMAL | Status: DC

## 2015-12-05 MED ORDER — ONDANSETRON HCL 4 MG PO TABS: 4.0000 mg | ORAL_TABLET | Freq: Three times a day (TID) | ORAL | Status: DC | PRN

## 2015-12-05 MED ORDER — INSULIN GLARGINE 100 UNIT/ML ~~LOC~~ SOLN
10.0000 [IU] | Freq: Every day | SUBCUTANEOUS | Status: DC
  Administered 2015-12-05: 10 [IU] via SUBCUTANEOUS

## 2015-12-05 MED ORDER — ACETAMINOPHEN 325 MG PO TABS: 650.0000 mg | ORAL_TABLET | Freq: Four times a day (QID) | ORAL | Status: DC | PRN

## 2015-12-05 NOTE — ED Provider Notes (Signed)
Inpatient psychiatric bed available. I assessed the patient's prior to transfer. Patient without any acute complaints. There is a patient did have hyperglycemia with initial blood sugar level in 700s however with insulin treatment patient is a blood sugar was well controlled. Last CBG prior to transfer in the 180s. Patient is safe for transfer. EMTALA completed.   Nira Conn, MD 12/05/15 [phone]

## 2015-12-05 NOTE — ED Notes (Signed)
Patient physically moved from [facility] to [facility]; sitter at bedside

## 2015-12-05 NOTE — Progress Notes (Signed)
[facility] INPATIENT:  Family/Significant Other Suicide Prevention Education  Suicide Prevention Education:  Patient Refusal for Family/Significant Other Suicide Prevention Education: The patient James [name] has refused to provide written consent for family/significant other to be provided Family/Significant Other Suicide Prevention Education during admission and/or prior to discharge.  Physician notified.  Glenice Laine B 12/05/2015, 9:30 PM

## 2015-12-05 NOTE — ED Notes (Signed)
Patient given shower supplies, towels and clean scrubs with non-slip socks; patient is heading to take a shower with sitter in tow

## 2015-12-05 NOTE — ED Notes (Signed)
Tried to call report to [facility] OBS  - they had requested earlier that CBG be under 200.  Current CBG 281 but insulin given.

## 2015-12-05 NOTE — ED Notes (Signed)
Got patient to sit up and eat his breakfast; sitter at bedside

## 2015-12-05 NOTE — ED Notes (Signed)
Pt transported by [facility] to [facility]

## 2015-12-05 NOTE — ED Notes (Signed)
Changed stretcher linens and pillowcase; placed clean blankets on end of stretcher also; patient is still in shower at this time; sitter at door

## 2015-12-05 NOTE — ED Notes (Signed)
Woke patient up to perform CBG and relieved sitter for bathroom break

## 2015-12-05 NOTE — ED Notes (Signed)
POCT CBG resulted 281; Amy, RN notified

## 2015-12-05 NOTE — ED Notes (Signed)
Pt provided with turkey sandwich.

## 2015-12-05 NOTE — ED Notes (Signed)
Lunch tray has arrived 

## 2015-12-05 NOTE — ED Notes (Addendum)
Called [employer] and placed a Carb modified no sharps lunch meal

## 2015-12-05 NOTE — ED Notes (Signed)
POCT CBG resulted 150; Amy, RN notified

## 2015-12-05 NOTE — ED Notes (Signed)
Spoke with Fannie Knee at [facility] OBS - she said still okay to send patient, but send at 400pm.   I advised we would update with new CBG when we call report closer to 400pm.

## 2015-12-06 DIAGNOSIS — F1994 Other psychoactive substance use, unspecified with psychoactive substance-induced mood disorder: Secondary | ICD-10-CM

## 2015-12-06 LAB — GLUCOSE, CAPILLARY
GLUCOSE-CAPILLARY: 262 mg/dL — AB (ref 65–99)
GLUCOSE-CAPILLARY: 62 mg/dL — AB (ref 65–99)
Glucose-Capillary: 271 mg/dL — ABNORMAL HIGH (ref 65–99)

## 2015-12-06 MED ORDER — METFORMIN HCL 1000 MG PO TABS: 1000.0000 mg | ORAL_TABLET | Freq: Two times a day (BID) | ORAL | 1 refills | Status: DC

## 2015-12-06 MED ORDER — BUPROPION HCL ER (XL) 150 MG PO TB24: 150.0000 mg | ORAL_TABLET | Freq: Every day | ORAL | 0 refills | Status: DC

## 2015-12-06 MED ORDER — INSULIN GLARGINE 100 UNIT/ML ~~LOC~~ SOLN: 10.0000 [IU] | Freq: Every day | SUBCUTANEOUS | 0 refills | Status: DC

## 2015-12-06 MED ORDER — INSULIN ASPART 100 UNIT/ML ~~LOC~~ SOLN: 0.0000 [IU] | Freq: Three times a day (TID) | SUBCUTANEOUS | 0 refills | Status: DC

## 2015-12-06 MED ORDER — INSULIN ASPART 100 UNIT/ML ~~LOC~~ SOLN: 0.0000 [IU] | Freq: Every day | SUBCUTANEOUS | 0 refills | Status: DC

## 2015-12-06 NOTE — BH Assessment (Signed)
DAR Note: Patient admitted to Obs. Unit from [facility] due to SI and cocaine use. Patient had a plan to jump in front of a car . (Patient already on unit. outgoing nurse did body/ belonging search). Patient appear restless, anxious and mildly irritated. Patient continues to endorse SI but verbally contracted for safety. Patient denies pain, HI, AH/VH at this time. Patient continue to request for sandwiches and salad stated I haven't eaten for a week now. (gotten 3 trays already). Patient gets more irritable on a patient snoring -requesting to be discharged if we cannot "handle the patient snoring". Patient provided with ear plugs, sound machine put on and sleeping pill offered. Staff offered support and encouragement as needed. Safety maintained by constant observation. Will continue to monitor patient for safety and stability.

## 2015-12-06 NOTE — Progress Notes (Signed)
Patient ID: James [name], male   DOB: [birth year], 55 y.o.   MRN: [id number]   Pt was discharged home with outpatient referrals. Pt was given information on [facility] to followup with on Monday. Pt reported that at discharge he would need a cab voucher to get home, because with his blood sugars were all over the place he could not sit at the bus stop. At time of being discharge patient reported that the last time he was here at [facility] he was given four bus passes to get to work for two days. Pt was given four bus passes at time of discharge, no other issues or concerns noted. Pt reported that he was negative SI/HI, no AH/VH noted.

## 2015-12-06 NOTE — Progress Notes (Addendum)
Patient ID: James [name], male   DOB: 01-31-1961, 55 y.o.   MRN: [id number]   Pts blood sugar at lunch was 62, he was given a snack. Pt was also getting ready for lunch. Will recheck patients blood sugar on . Vernona Rieger NP made aware, no new orders noted.

## 2015-12-06 NOTE — Progress Notes (Signed)
Patient ID: James [name], male   DOB: 03/22/1961, 55 y.o.   MRN: [id number]  Pts blood sugar was rechecked, it was 271 after meal and snack.

## 2015-12-06 NOTE — H&P (Signed)
[facility] Unit Admission Assessment Adult  Patient Identification: James [name] MRN:  [id number] Date of Evaluation:  12/06/2015 Chief Complaint:  Patient states "My cocaine use makes me feel hopeless."  Principal Diagnosis: Substance Induced Mood Disorder, MDD Recurrent moderate Cocaine Use Disorder, Moderate Alcohol Use Disorder, Moderate  Patient Active Problem List   Diagnosis Date Noted  . MDD (major depressive disorder), recurrent severe, without psychosis (HCC) [F33.2] 06/26/2015  . Substance induced mood disorder (HCC) [F19.94]   . Cocaine use disorder, moderate, dependence (HCC) [F14.20]   . Alcohol use disorder, moderate, dependence (HCC) [F10.20]   . Cocaine use disorder, severe, dependence (HCC) [F14.20] 06/25/2015  . Cocaine-induced mood disorder (HCC) [F14.94] 06/06/2015  . 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 Major depressive disorder, recurrent episode (HCC) [F33.9] 05/28/2015  . Suicidal ideation [R45.851]   . Alcohol withdrawal (HCC) [F10.239] 12/07/2012  . Cocaine dependence (HCC) [F14.20] 03/01/2012  . Alcohol abuse [F10.10] 03/01/2012  . Major depression (HCC) [F32.9] 03/01/2012  . 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 MUSCLE SPASM, BACK [M53.80] 02/12/2009  . 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 HYPERCHOLESTEROLEMIA [E78.00] 05/09/2007  . SINUSITIS, CHRONIC [J32.9] 05/01/2007  . ALLERGIC RHINITIS [J30.9] 12/21/2006  . DIABETES MELLITUS, TYPE II [E11.9] 12/07/2006  . HYPERTENSION [I10] 12/07/2006   History of Present Illness:    Per initial assessment completed by tele-psych:  James [name] is a 55 year old [facility] and plan to walk into traffic. Pt is cooperative and  oriented x 4.  Pt says he hasn't slept since 11/28/15. Pt reports [place] [address] early this am and wanted to jump in front of a car to kill himself. Pt say he called a friend to bring him to the ED. He says, "I'm tired of this. I'm tired of hurting my family." Pt reports he relapsed on crack cocaine in 2013 after 5 yrs clean and sober. Pt says he has used approx $100 worth crack daily for past 3 weeks. Last use was 12/03/15. He reports depressed mood with fatigue, poor appeitite (45 lbs weight loss), insomnia, irritability, worthlessness, guilt, social isolation and loss of interest in usual pleasures. Pt [facility]. No delusions noted. Per chart review, pt [facility] from 2012 to 2017. [facility]. Pt reports he is living on the streets. He said he may be able to stay with his wife, but things are very tense at home because of his drug use. Pt denies hx of trauma or abuse. He has court date 01/06/16 for vehicle larceny which he says was misunderstanding.   Since being admitted to the Observation unit patient reports his suicidal thoughts are much better. On admission the patient was noted to be very hungry due to not eating for at least a week.  He was found stable to discharge on 12/06/2015 due to denying suicidal ideation and appearing motivated to follow up outpatient. He was also advised to follow up with his PCP as he reported not being compliant with his diabetic management.   Associated Signs/Symptoms: Depression Symptoms:  depressed mood, feelings of worthlessness/guilt, hopelessness, suicidal thoughts with specific plan, (Hypo) Manic Symptoms:  N/A Anxiety Symptoms:  Excessive Worry, Psychotic Symptoms:  Hallucinations:  Visual PTSD Symptoms: Negative Total Time spent with patient: 30 minutes  Past Psychiatric History: ( See HPI)  Is the patient at risk to self? Yes.    Has the patient  been a risk to self in the past 6 months? Yes.    Has the patient been a risk to self within the distant past? Yes.    Is the patient a risk to others? Yes.    Has the patient been a risk to others in the past 6 months? Yes.    Has the patient been a risk to others within the distant past? Yes.     Prior Inpatient Therapy:   Prior Outpatient Therapy:    Alcohol Screening:   Substance Abuse History in the last 12 months:  Yes.   Consequences of Substance Abuse: Financial and legal matters Previous Psychotropic Medications: Yes  Psychological Evaluations: Yes  Past Medical History:  Past Medical History:  Diagnosis Date  . Diabetes mellitus without complication [facility])     Past Surgical History:  Procedure Laterality Date  . FINGER SURGERY     Family History:  Family History  Problem Relation Age of Onset  . Diabetes Other   . Hypertension Other    Family Psychiatric  History: Unremarkable Tobacco Screening:Positive Smoker Social History:  History  Alcohol Use  . Yes     History  Drug Use  . Frequency: 3.0 times per week  . Types: Cocaine, "Crack" cocaine    Additional Social History:                           Allergies:  No Known Allergies Lab Results:  Results for orders placed or performed during the hospital encounter of 12/05/15 (from the past 48 hour(s))  Glucose, capillary     Status: Abnormal   Collection Time: 12/05/15  7:44 PM  Result Value Ref Range   Glucose-Capillary 301 (H) 65 - 99 mg/dL  Glucose, capillary     Status: Abnormal   Collection Time: 12/05/15 11:53 PM  Result Value Ref Range   Glucose-Capillary 116 (H) 65 - 99 mg/dL  Glucose, capillary     Status: Abnormal   Collection Time: 12/06/15  6:29 AM  Result Value Ref Range   Glucose-Capillary 262 (H) 65 - 99 mg/dL   Comment 1 Notify RN   Glucose, capillary     Status: Abnormal   Collection Time: 12/06/15 11:27 AM  Result Value Ref Range   Glucose-Capillary 62 (L) 65 - 99 mg/dL   Glucose, capillary     Status: Abnormal   Collection Time: 12/06/15  1:00 PM  Result Value Ref Range   Glucose-Capillary 271 (H) 65 - 99 mg/dL    Blood Alcohol level:  Lab Results  Component Value Date   ETH <5 12/04/2015   ETH <5 06/25/2015    Metabolic Disorder Labs:  Lab Results  Component Value Date   HGBA1C 14.3 (H) 05/29/2015   MPG 364 05/29/2015   MPG 272 (H) 12/07/2012   No results found for: PROLACTIN Lab Results  Component Value Date   CHOL 173 08/16/2008   TRIG 34 08/16/2008   HDL 76 08/16/2008   CHOLHDL 2.3 Ratio 08/16/2008   VLDL 7 08/16/2008   LDLCALC 90 08/16/2008   LDLCALC 98 12/20/2007    Current Medications: Current Facility-Administered Medications  Medication Dose Route Frequency Provider Last Rate Last Dose  . acetaminophen (TYLENOL) tablet 650 mg  650 mg Oral  Q6H PRN Thermon Leyland, NP      . acetaminophen (TYLENOL) tablet 650 mg  650 mg Oral Q4H PRN Thermon Leyland, NP      . alum & mag hydroxide-simeth (MAALOX/MYLANTA) 200-200-20 MG/5ML suspension 30 mL  30 mL Oral Q4H PRN Thermon Leyland, NP      . alum & mag hydroxide-simeth (MAALOX/MYLANTA) 200-200-20 MG/5ML suspension 30 mL  30 mL Oral PRN Thermon Leyland, NP      . buPROPion (WELLBUTRIN XL) 24 hr tablet 150 mg  150 mg Oral Daily Thermon Leyland, NP   150 mg at 12/06/15 0835  . ibuprofen (ADVIL,MOTRIN) tablet 600 mg  600 mg Oral Q8H PRN Thermon Leyland, NP      . insulin aspart (novoLOG) injection 0-15 Units  0-15 Units Subcutaneous Q4H Thermon Leyland, NP   8 Units at 12/06/15 [id number]  . insulin glargine (LANTUS) injection 10 Units  10 Units Subcutaneous QHS Thermon Leyland, NP   10 Units at 12/05/15 2251  . LORazepam (ATIVAN) tablet 0-4 mg  0-4 mg Oral Q12H Thermon Leyland, NP      . magnesium hydroxide (MILK OF MAGNESIA) suspension 30 mL  30 mL Oral Daily PRN Thermon Leyland, NP      . metFORMIN (GLUCOPHAGE) tablet 1,000 mg  1,000 mg Oral BID WC Thermon Leyland, NP   1,000 mg at 12/06/15 9604  . nicotine  (NICODERM CQ - dosed in mg/24 hours) patch 21 mg  21 mg Transdermal Daily Thermon Leyland, NP      . ondansetron [facility]) tablet 4 mg  4 mg Oral Q8H PRN Thermon Leyland, NP      . traZODone (DESYREL) tablet 50 mg  50 mg Oral QHS PRN Thermon Leyland, NP   50 mg at 12/05/15 2240   PTA Medications: Prescriptions Prior to Admission  Medication Sig Dispense Refill Last Dose  . nicotine (NICODERM CQ - DOSED IN MG/24 HOURS) 21 mg/24hr patch Place 1 patch (21 mg total) onto the skin daily at 6 (six) AM. For smoking cessation 28 patch 0   . traZODone (DESYREL) 50 MG tablet Take 1 tablet (50 mg total) by mouth at bedtime as needed for sleep. 14 tablet 0   . [DISCONTINUED] buPROPion (WELLBUTRIN XL) 150 MG 24 hr tablet Take 1 tablet (150 mg total) by mouth daily. 14 tablet 0   . [DISCONTINUED] insulin aspart (NOVOLOG) 100 UNIT/ML injection Inject 0-15 Units into the skin 3 (three) times daily with meals. SEE SLIDING SCALE FROM MAR 10 mL 0   . [DISCONTINUED] insulin aspart (NOVOLOG) 100 UNIT/ML injection Inject 0-5 Units into the skin at bedtime. 10 mL 0   . [DISCONTINUED] insulin glargine (LANTUS) 100 UNIT/ML injection Inject 0.1 mLs (10 Units total) into the skin at bedtime. 10 mL 0   . [DISCONTINUED] metFORMIN (GLUCOPHAGE) 1000 MG tablet Take 1 tablet (1,000 mg total) by mouth 2 (two) times daily with a meal. For diabetes management 42 tablet 1     Musculoskeletal: Strength & Muscle Tone: decreased Gait & Station: unsteady Patient leans: Backward  Psychiatric Specialty Exam: Physical Exam  Nursing note and vitals reviewed. Constitutional: He is oriented to person, place, and time. Vital signs are normal. He appears cachectic.  Musculoskeletal: He exhibits no edema.  Neurological: He is alert and oriented to person, place, and time. No cranial nerve deficit.  Skin: Skin is warm and dry.    Review of Systems  Psychiatric/Behavioral: Positive for depression and substance abuse. Negative for  hallucinations, memory loss and suicidal ideas. The patient is nervous/anxious. The patient does not have insomnia.   All other systems reviewed and are negative.   Blood pressure (!) 114/55, pulse (!) 105, temperature 98.1 F (36.7 C), temperature source Oral, resp. rate 20, height 6' (1.829 m), weight 60.8 kg (134 lb), SpO2 100 %.Body mass index is 18.17 kg/m.  General Appearance: Disheveled  Eye Solicitor::  Fair  Speech:  Garbled  Volume:  Decreased  Mood:  Depressed  Affect:  Congruent  Thought Process:  Circumstantial  Orientation:  Full (Time, Place, and Person)  Thought Content:  Requesting help with drug abuse  Suicidal Thoughts:  No Expressed prior to admission  Homicidal Thoughts:  No  Memory:  Immediate;   Fair  Judgement:  Impaired  Insight:  Lacking  Psychomotor Activity:  Negative  Concentration:  Poor  Recall:  Poor  Fund of Knowledge:Poor  Language: Fair  Akathisia:  Negative  Handed:  Right  AIMS (if indicated):     Assets:  Communication Skills Desire for Improvement Leisure Time  ADL's:  Intact  Cognition: WNL  Sleep:        Treatment Plan Summary: Plan Admitted to Observation Unit for crises intervention, safety and stabilization. Alcohol detox protocol initiated, further plans per TTS to follow  Observation Level/Precautions:  Continuous Observation  Laboratory:  Chemistry panel, CBC, UDS  Psychotherapy:  Individual for substance abuse   Medications:  Restart home medications  Consultations: None  Discharge Concerns:  Continued cocaine abuse   Estimated LOS: Less than 24 hours  Other:  Observation staff to explore resources to help patient cope with stressors to include chronic homelessness and substance abuse    DAVIS, LAURA, NP 8/26/20171:32 PM  Agree with NP note and assessment

## 2015-12-06 NOTE — Discharge Summary (Signed)
   [facility] Unit Discharge Summary  James [name] is a 55 year old male who voluntarily to [facility] with SI and plan to walk into traffic. Pt is cooperative and oriented x 4.  Pt says he hasn't slept since 11/28/15. Pt reports he [place] [address] [place] this am and wanted to jump in front of a car to kill himself. Pt say he called a friend to bring him to the ED. He says, "I'm tired of this. I'm tired of hurting my family." Pt reports he relapsed on crack cocaine in 2013 after 5 yrs clean and sober. Pt says he has used approx $100 worth crack daily for past 3 weeks. Last use was 12/03/15. He reports depressed mood with fatigue, poor appeitite (45 lbs weight loss), insomnia, irritability, worthlessness, guilt, social isolation and loss of interest in usual pleasures. Pt denies HI and [facility]. No delusions noted. Per chart review, pt was been admitted to [facility] several times from 2012 to 2017. He most recently was admitted to [facility] OBS unit in March 2017. Pt reports he is living on the streets. He said he may be able to stay with his wife, but things are very tense at home because of his drug use. Pt denies hx of trauma or abuse. He has court date 01/06/16 for vehicle larceny which he says was misunderstanding.   Since being admitted to the Observation unit patient reports his suicidal thoughts are much better. On admission the patient was noted to be very hungry due to not eating for at least a week.  He was found stable to discharge on 12/06/2015 due to denying suicidal ideation and appearing motivated to follow up outpatient. He was also advised to follow up with his PCP as he reported not being compliant with his diabetic management. Patient was provided with a prescription at his request for Wellbutrin, which he reported running out of due to not following up as directed after last discharge from [facility]. The patient was discharged in stable condition with all belongings returned to him and was provided  with bus pass to assist with transportation.   Agree with NP note as above

## 2015-12-06 NOTE — Discharge Instructions (Signed)
Pt was also given additional resources for outpatient referrals.  [facility]

## 2015-12-11 ENCOUNTER — Encounter (HOSPITAL_COMMUNITY): Payer: Self-pay | Admitting: Emergency Medicine

## 2015-12-11 ENCOUNTER — Inpatient Hospital Stay (HOSPITAL_COMMUNITY)
Admission: AD | Admit: 2015-12-11 | Discharge: 2015-12-16 | DRG: 885 | Disposition: A | Discharge status: Home / Self Care | Payer: Federal, State, Local not specified - Other | Source: Intra-hospital | Attending: Psychiatry | Admitting: Psychiatry

## 2015-12-11 ENCOUNTER — Emergency Department (HOSPITAL_COMMUNITY)
Admission: EM | Admit: 2015-12-11 | Discharge: 2015-12-11 | Disposition: A | Discharge status: Other Acute Inpatient Hospital | Payer: Self-pay | Attending: Emergency Medicine | Admitting: Emergency Medicine

## 2015-12-11 DIAGNOSIS — F17210 Nicotine dependence, cigarettes, uncomplicated: Secondary | ICD-10-CM | POA: Insufficient documentation

## 2015-12-11 DIAGNOSIS — F1010 Alcohol abuse, uncomplicated: Secondary | ICD-10-CM | POA: Diagnosis present

## 2015-12-11 DIAGNOSIS — F341 Dysthymic disorder: Secondary | ICD-10-CM | POA: Insufficient documentation

## 2015-12-11 DIAGNOSIS — Z8249 Family history of ischemic heart disease and other diseases of the circulatory system: Secondary | ICD-10-CM | POA: Diagnosis not present

## 2015-12-11 DIAGNOSIS — Z7984 Long term (current) use of oral hypoglycemic drugs: Secondary | ICD-10-CM | POA: Insufficient documentation

## 2015-12-11 DIAGNOSIS — E1165 Type 2 diabetes mellitus with hyperglycemia: Secondary | ICD-10-CM | POA: Insufficient documentation

## 2015-12-11 DIAGNOSIS — F322 Major depressive disorder, single episode, severe without psychotic features: Secondary | ICD-10-CM | POA: Diagnosis present

## 2015-12-11 DIAGNOSIS — F332 Major depressive disorder, recurrent severe without psychotic features: Principal | ICD-10-CM | POA: Diagnosis present

## 2015-12-11 DIAGNOSIS — E119 Type 2 diabetes mellitus without complications: Secondary | ICD-10-CM | POA: Diagnosis present

## 2015-12-11 DIAGNOSIS — E7800 Pure hypercholesterolemia, unspecified: Secondary | ICD-10-CM | POA: Diagnosis present

## 2015-12-11 DIAGNOSIS — Z794 Long term (current) use of insulin: Secondary | ICD-10-CM | POA: Insufficient documentation

## 2015-12-11 DIAGNOSIS — F411 Generalized anxiety disorder: Secondary | ICD-10-CM | POA: Diagnosis present

## 2015-12-11 DIAGNOSIS — Z79899 Other long term (current) drug therapy: Secondary | ICD-10-CM | POA: Insufficient documentation

## 2015-12-11 DIAGNOSIS — Z808 Family history of malignant neoplasm of other organs or systems: Secondary | ICD-10-CM | POA: Diagnosis not present

## 2015-12-11 DIAGNOSIS — F1410 Cocaine abuse, uncomplicated: Secondary | ICD-10-CM | POA: Insufficient documentation

## 2015-12-11 DIAGNOSIS — I10 Essential (primary) hypertension: Secondary | ICD-10-CM | POA: Diagnosis present

## 2015-12-11 DIAGNOSIS — G4700 Insomnia, unspecified: Secondary | ICD-10-CM | POA: Diagnosis present

## 2015-12-11 DIAGNOSIS — Z9114 Patient's other noncompliance with medication regimen: Secondary | ICD-10-CM

## 2015-12-11 DIAGNOSIS — R45851 Suicidal ideations: Secondary | ICD-10-CM | POA: Insufficient documentation

## 2015-12-11 DIAGNOSIS — Z818 Family history of other mental and behavioral disorders: Secondary | ICD-10-CM | POA: Diagnosis not present

## 2015-12-11 DIAGNOSIS — R739 Hyperglycemia, unspecified: Secondary | ICD-10-CM

## 2015-12-11 DIAGNOSIS — Z833 Family history of diabetes mellitus: Secondary | ICD-10-CM

## 2015-12-11 DIAGNOSIS — F1411 Cocaine abuse, in remission: Secondary | ICD-10-CM | POA: Diagnosis present

## 2015-12-11 DIAGNOSIS — F1420 Cocaine dependence, uncomplicated: Secondary | ICD-10-CM | POA: Diagnosis not present

## 2015-12-11 DIAGNOSIS — Z87898 Personal history of other specified conditions: Secondary | ICD-10-CM | POA: Diagnosis present

## 2015-12-11 LAB — COMPREHENSIVE METABOLIC PANEL
ALBUMIN: 3.5 g/dL (ref 3.5–5.0)
ALK PHOS: 79 U/L (ref 38–126)
ALT: 24 U/L (ref 17–63)
ANION GAP: 6 (ref 5–15)
AST: 20 U/L (ref 15–41)
BILIRUBIN TOTAL: 0.5 mg/dL (ref 0.3–1.2)
BUN: 10 mg/dL (ref 6–20)
CALCIUM: 9 mg/dL (ref 8.9–10.3)
CO2: 28 mmol/L (ref 22–32)
Chloride: 101 mmol/L (ref 101–111)
Creatinine, Ser: 0.92 mg/dL (ref 0.61–1.24)
GFR calc non Af Amer: >60 mL/min (ref >60)
GLUCOSE: 361 mg/dL — AB (ref 65–99)
POTASSIUM: 3.7 mmol/L (ref 3.5–5.1)
SODIUM: 135 mmol/L (ref 135–145)
TOTAL PROTEIN: 6 g/dL — AB (ref 6.5–8.1)

## 2015-12-11 LAB — CBC
HCT: 38.3 % — ABNORMAL LOW (ref 39.0–52.0)
Hemoglobin: 12.9 g/dL — ABNORMAL LOW (ref 13.0–17.0)
MCH: 28.5 pg (ref 26.0–34.0)
MCHC: 33.7 g/dL (ref 30.0–36.0)
MCV: 84.7 fL (ref 78.0–100.0)
PLATELETS: 201 10*3/uL (ref 150–400)
RBC: 4.52 MIL/uL (ref 4.22–5.81)
RDW: 14.1 % (ref 11.5–15.5)
WBC: 4 10*3/uL (ref 4.0–10.5)

## 2015-12-11 LAB — RAPID URINE DRUG SCREEN, HOSP PERFORMED
Amphetamines: NOT DETECTED
BENZODIAZEPINES: NOT DETECTED
Barbiturates: NOT DETECTED
COCAINE: POSITIVE — AB
OPIATES: NOT DETECTED
Tetrahydrocannabinol: NOT DETECTED

## 2015-12-11 LAB — CBG MONITORING, ED
GLUCOSE-CAPILLARY: 238 mg/dL — AB (ref 65–99)
GLUCOSE-CAPILLARY: 361 mg/dL — AB (ref 65–99)
Glucose-Capillary: 361 mg/dL — ABNORMAL HIGH (ref 65–99)
Glucose-Capillary: 329 mg/dL — ABNORMAL HIGH (ref 65–99)
Glucose-Capillary: 183 mg/dL — ABNORMAL HIGH (ref 65–99)

## 2015-12-11 LAB — ETHANOL: Alcohol, Ethyl (B): <5 mg/dL (ref <5)

## 2015-12-11 LAB — ACETAMINOPHEN LEVEL

## 2015-12-11 LAB — SALICYLATE LEVEL

## 2015-12-11 MED ORDER — ALUM & MAG HYDROXIDE-SIMETH 200-200-20 MG/5ML PO SUSP: 30.0000 mL | ORAL | Status: DC | PRN

## 2015-12-11 MED ORDER — IBUPROFEN 400 MG PO TABS: 600.0000 mg | ORAL_TABLET | Freq: Three times a day (TID) | ORAL | Status: DC | PRN

## 2015-12-11 MED ORDER — METFORMIN HCL 500 MG PO TABS
1000.0000 mg | ORAL_TABLET | Freq: Two times a day (BID) | ORAL | Status: DC
  Administered 2015-12-11: 1000 mg via ORAL
  Filled 2015-12-11: qty 2

## 2015-12-11 MED ORDER — NICOTINE 21 MG/24HR TD PT24: 21.0000 mg | MEDICATED_PATCH | Freq: Every day | TRANSDERMAL | Status: DC

## 2015-12-11 MED ORDER — SODIUM CHLORIDE 0.9 % IV BOLUS (SEPSIS)
1000.0000 mL | Freq: Once | INTRAVENOUS | Status: AC
  Administered 2015-12-11: 1000 mL via INTRAVENOUS

## 2015-12-11 MED ORDER — TRAZODONE HCL 50 MG PO TABS: 50.0000 mg | ORAL_TABLET | Freq: Every evening | ORAL | Status: DC | PRN

## 2015-12-11 MED ORDER — INSULIN ASPART 100 UNIT/ML ~~LOC~~ SOLN
0.0000 [IU] | SUBCUTANEOUS | Status: DC
  Administered 2015-12-11: 3 [IU] via SUBCUTANEOUS
  Administered 2015-12-11: 15 [IU] via SUBCUTANEOUS
  Filled 2015-12-11 (×2): qty 1

## 2015-12-11 MED ORDER — ACETAMINOPHEN 325 MG PO TABS: 650.0000 mg | ORAL_TABLET | ORAL | Status: DC | PRN

## 2015-12-11 MED ORDER — LORAZEPAM 1 MG PO TABS: 1.0000 mg | ORAL_TABLET | Freq: Three times a day (TID) | ORAL | Status: DC | PRN

## 2015-12-11 MED ORDER — INSULIN ASPART 100 UNIT/ML ~~LOC~~ SOLN
10.0000 [IU] | Freq: Once | SUBCUTANEOUS | Status: AC
  Administered 2015-12-11: 10 [IU] via SUBCUTANEOUS
  Filled 2015-12-11: qty 1

## 2015-12-11 MED ORDER — ONDANSETRON HCL 4 MG PO TABS: 4.0000 mg | ORAL_TABLET | Freq: Three times a day (TID) | ORAL | Status: DC | PRN

## 2015-12-11 MED ORDER — INSULIN GLARGINE 100 UNIT/ML ~~LOC~~ SOLN
10.0000 [IU] | Freq: Every day | SUBCUTANEOUS | Status: DC
  Administered 2015-12-11: 10 [IU] via SUBCUTANEOUS
  Filled 2015-12-11: qty 0.1

## 2015-12-11 NOTE — Progress Notes (Signed)
Inpatient Diabetes Program Recommendations  AACE/ADA: New Consensus Statement on Inpatient Glycemic Control (2015)  Target Ranges:  Prepandial:   less than 140 mg/dL      Peak postprandial:   less than 180 mg/dL (1-2 hours)      Critically ill patients:  140 - 180 mg/dL   Results for JENNIE, MCNAIR (MRN [id number]) as of 12/11/2015 14:12  Ref. Range 12/11/2015 07:46 12/11/2015 08:46 12/11/2015 11:07  Glucose-Capillary Latest Ref Range: 65 - 99 mg/dL 676 (H) 195 (H) 093 (H)   Review of Glycemic Control   Outpatient Diabetes medications: Lantus 10 units QHS, Novolog 0-15 units TID with meals, Novolog 0-5 units QHS, Metformin 1000 mg BID Current orders for Inpatient glycemic control: None; in ED  Inpatient Diabetes Program Recommendations: Insulin - Basal: Please consider ordering Lantus 10 units QHS. Correction (SSI): Please consider using the Glycemic Control order set to order CBGs with Novolog sensitive correction scale ACHS.  Thanks, Orlando Penner, RN, MSN, CDE Diabetes Coordinator Inpatient Diabetes Program [phone] (Team Pager from 8am to 5pm) [phone] ([facility] office) [phone] [facility] office) [phone] [facility] office)

## 2015-12-11 NOTE — BH Assessment (Signed)
TTS completed the assessment.  Pending psych disposition.

## 2015-12-11 NOTE — ED Notes (Signed)
Pt provided with crackers and peanut butter and a diet coke for snack.  Dinner tray ordered

## 2015-12-11 NOTE — ED Provider Notes (Signed)
9:38 PM  Patient accepted in transfer to [facility] by Dr. Vanetta Shawl. EMTALA completed for transfer. The patient has been medically cleared for transfer.   Antony Madura, PA-C 12/11/15 2139    Lyndal Pulley, MD 12/12/15 [phone]

## 2015-12-11 NOTE — ED Notes (Signed)
Dinner tray delivered.

## 2015-12-11 NOTE — ED Triage Notes (Signed)
Patient comes in today with c/o suicidal thoughts with a plan. Patient states he had a firearm in hand and almost ended his life this morning. He has a cocaine addiction and used about an hour ago. Patient states after his father's death four years ago, he has started using cocaine again. He expresses a desire to change for the sake of his family. Patient expresses guilt and sadness.

## 2015-12-11 NOTE — ED Provider Notes (Signed)
[facility]-EMERGENCY DEPT Provider Note   CSN: [id number] Arrival date & time: 12/11/15  [id number]     History   Chief Complaint Chief Complaint  Patient presents with  . Suicidal  . Addiction Problem    HPI James [name] is a 55 y.o. male.  James GoingJohnnie L Wnuk is a 55 y.o. Male who presents the emergency department complaining of suicidal ideations today. He reports he had a firearm in his hand and almost killed himself this morning. He reports he has been using cocaine and last used about an hour ago. He has diabetes and is out of his insulin. He reports he has no insurance and cannot afford his insulin. He has not been taking insulin or checking his blood sugars recently. He had a recent behavioral health admission and was discharged 4 days ago. He reports hearing voices but denies command hallucinations. He denies physical complaints. He denies fevers, chest pain, shortness of breath, coughing, abdominal pain, nausea, vomiting, urinary symptoms or rashes.    The history is provided by the patient. No language interpreter was used.    Past Medical History:  Diagnosis Date  . Diabetes mellitus without complication [facility](HCC)     Patient Active Problem List   Diagnosis Date Noted  . MDD (major depressive disorder), recurrent severe, without psychosis (HCC) 06/26/2015  . Substance induced mood disorder (HCC)   . Cocaine use disorder, moderate, dependence (HCC)   . Alcohol use disorder, moderate, dependence (HCC)   . Cocaine use disorder, severe, dependence (HCC) 06/25/2015  . Cocaine-induced mood disorder (HCC) 06/06/2015  . 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 Major depressive disorder, recurrent episode (HCC) 05/28/2015  . Suicidal ideation   . Alcohol withdrawal (HCC) 12/07/2012  . Cocaine dependence (HCC) 03/01/2012  . Alcohol  abuse 03/01/2012  . Major depression (HCC) 03/01/2012  . SPONDYLOSIS, CERVICAL, WITH RADICULOPATHY 02/25/2009  . MUSCLE SPASM, BACK 02/12/2009  . LEUKOPENIA, MILD 08/19/2008  . ECZEMA, HANDS 08/06/2008  . HYPERCHOLESTEROLEMIA 05/09/2007  . SINUSITIS, CHRONIC 05/01/2007  . ALLERGIC RHINITIS 12/21/2006  . DIABETES MELLITUS, TYPE II 12/07/2006  . HYPERTENSION 12/07/2006    Past Surgical History:  Procedure Laterality Date  . FINGER SURGERY         Home Medications    Prior to Admission medications   Medication Sig Start Date End Date Taking? Authorizing Provider  metFORMIN (GLUCOPHAGE) 1000 MG tablet Take 1 tablet (1,000 mg total) by mouth 2 (two) times daily with a meal. For diabetes management 12/06/15  Yes Thermon LeylandLaura A Davis, NP  nicotine (NICODERM CQ - DOSED IN MG/24 HOURS) 21 mg/24hr patch Place 1 patch (21 mg total) onto the skin daily at 6 (six) AM. For smoking cessation Patient taking differently: Place 21 mg onto the skin daily as needed (for smoking cessation). For smoking cessation 06/26/15  Yes Beau FannyJohn C Withrow, FNP  traZODone (DESYREL) 50 MG tablet Take 1 tablet (50 mg total) by mouth at bedtime as needed for sleep. 06/26/15  Yes Beau FannyJohn C Withrow, FNP  buPROPion (WELLBUTRIN XL) 150 MG 24 hr tablet Take 1 tablet (150 mg total) by mouth daily. Patient not taking: Reported on 12/11/2015 12/06/15   Thermon LeylandLaura A Davis, NP  insulin aspart (NOVOLOG) 100 UNIT/ML injection Inject 0-15 Units into the skin 3 (three) times daily with meals. SEE SLIDING SCALE FROM [facility] Patient not taking: Reported on 12/11/2015 12/06/15   Gerome ApleyLaura A  Earlene Plater, NP  insulin aspart (NOVOLOG) 100 UNIT/ML injection Inject 0-5 Units into the skin at bedtime. Patient not taking: Reported on 12/11/2015 12/06/15   Thermon Leyland, NP  insulin glargine (LANTUS) 100 UNIT/ML injection Inject 0.1 mLs (10 Units total) into the skin at bedtime. Patient not taking: Reported on 12/11/2015 12/06/15   Thermon Leyland, NP    Family History Family  History  Problem Relation Age of Onset  . Diabetes Other   . Hypertension Other     Social History Social History  Substance Use Topics  . Smoking status: Current Every Day Smoker    Packs/day: 0.50    Years: 23.00    Types: Cigarettes  . Smokeless tobacco: Never Used  . Alcohol use Yes     Allergies   Review of patient's allergies indicates no known allergies.   Review of Systems Review of Systems  Constitutional: Negative for chills and fever.  HENT: Negative for congestion and sore throat.   Eyes: Negative for visual disturbance.  Respiratory: Negative for cough and shortness of breath.   Cardiovascular: Negative for chest pain.  Gastrointestinal: Negative for abdominal pain, nausea and vomiting.  Genitourinary: Negative for dysuria.  Musculoskeletal: Negative for back pain.  Skin: Negative for rash.  Neurological: Negative for headaches.  Psychiatric/Behavioral: Positive for dysphoric mood and suicidal ideas.     Physical Exam Updated Vital Signs BP 126/84   Pulse 85   Temp 97.9 F (36.6 C) (Oral)   Resp 16   Ht 6' (1.829 m)   Wt 60.8 kg   SpO2 100%   BMI 18.17 kg/m   Physical Exam  Constitutional: He appears well-developed and well-nourished. No distress.  HENT:  Head: Normocephalic and atraumatic.  Right Ear: External ear normal.  Left Ear: External ear normal.  Mouth/Throat: Oropharynx is clear and moist.  Eyes: Conjunctivae are normal. Pupils are equal, round, and reactive to light. Right eye exhibits no discharge. Left eye exhibits no discharge.  Neck: Neck supple.  Cardiovascular: Normal rate, regular rhythm, normal heart sounds and intact distal pulses.   Pulmonary/Chest: Effort normal and breath sounds normal. No respiratory distress.  Abdominal: Soft. There is no tenderness.  Musculoskeletal: He exhibits no edema.  Lymphadenopathy:    He has no cervical adenopathy.  Neurological: He is alert. Coordination normal.  Skin: Skin is warm and  dry. Capillary refill takes less than 2 seconds. No rash noted. He is not diaphoretic. No erythema. No pallor.  Psychiatric: He exhibits a depressed mood. He expresses suicidal ideation. He expresses no homicidal ideation. He expresses suicidal plans.  Patient appears depressed. He has good eye contact. He endorses suicidal ideations with a plan to shoot himself.   Nursing note and vitals reviewed.    ED Treatments / Results  Labs (all labs ordered are listed, but only abnormal results are displayed) Labs Reviewed  COMPREHENSIVE METABOLIC PANEL - Abnormal; Notable for the following:       Result Value   Glucose, Bld 361 (*)    Total Protein 6.0 (*)    All other components within normal limits  ACETAMINOPHEN LEVEL - Abnormal; Notable for the following:    Acetaminophen (Tylenol), Serum <10 (*)    All other components within normal limits  CBC - Abnormal; Notable for the following:    Hemoglobin 12.9 (*)    HCT 38.3 (*)    All other components within normal limits  URINE RAPID DRUG SCREEN, HOSP PERFORMED - Abnormal; Notable for the following:  Cocaine POSITIVE (*)    All other components within normal limits  CBG MONITORING, ED - Abnormal; Notable for the following:    Glucose-Capillary 361 (*)    All other components within normal limits  CBG MONITORING, ED - Abnormal; Notable for the following:    Glucose-Capillary 329 (*)    All other components within normal limits  CBG MONITORING, ED - Abnormal; Notable for the following:    Glucose-Capillary 238 (*)    All other components within normal limits  ETHANOL  SALICYLATE LEVEL    EKG  EKG Interpretation None       Radiology No results found.  Procedures Procedures (including critical care time)  Medications Ordered in ED Medications  alum & mag hydroxide-simeth (MAALOX/MYLANTA) 200-200-20 MG/5ML suspension 30 mL (not administered)  ondansetron (ZOFRAN) tablet 4 mg (not administered)  ibuprofen (ADVIL,MOTRIN)  tablet 600 mg (not administered)  acetaminophen (TYLENOL) tablet 650 mg (not administered)  LORazepam (ATIVAN) tablet 1 mg (not administered)  insulin glargine (LANTUS) injection 10 Units (not administered)  nicotine (NICODERM CQ - dosed in mg/24 hours) patch 21 mg (not administered)  traZODone (DESYREL) tablet 50 mg (not administered)  metFORMIN (GLUCOPHAGE) tablet 1,000 mg (not administered)  insulin aspart (novoLOG) injection 0-15 Units (not administered)  sodium chloride 0.9 % bolus 1,000 mL (0 mLs Intravenous Stopped 12/11/15 1016)  insulin aspart (novoLOG) injection 10 Units (10 Units Subcutaneous Given 12/11/15 0846)     Initial Impression / Assessment and Plan / ED Course  I have reviewed the triage vital signs and the nursing notes.  Pertinent labs & imaging results that were available during my care of the patient were reviewed by me and considered in my medical decision making (see chart for details).  Clinical Course   Patient presented to the emergency department complaining of suicidal ideations with a plan to shoot himself. He also reports he has not been taking his insulin and reports he does not have any at home. On exam patient is afebrile nontoxic appearing. He appears depressed and endorses suicidal ideations with a plan to shoot himself. Fingerstick glucose is 361. BMP returned with a normal anion gap with a glucose of 361. Urine drug screen is positive for cocaine. Acetaminophen, salicylate and alcohol level is undetectable. CBC is unremarkable. Patient received a fluid bolus and insulin and blood sugar improved even after eating. Will restart patient on Lantus and sliding scale insulin during his admission. TTS is seeking placement. The patient is medically clear for behavioral health admission. Psychiatric temporary admission orders were placed. His home medications were reordered.  Patient moved to psych hold while awaiting placement. He is voluntary.    Final Clinical  Impressions(s) / ED Diagnoses   Final diagnoses:  Suicidal ideation  Cocaine abuse  Hyperglycemia    New Prescriptions New Prescriptions   No medications on file     Everlene Farrier, PA-C 12/11/15 1542    Azalia Bilis, MD 12/11/15 2212

## 2015-12-11 NOTE — BH Assessment (Signed)
Per Fredna Dow, NP - patient meets criteria for inpatient hospitalization.  TTS will seek placement.

## 2015-12-11 NOTE — BH Assessment (Addendum)
Tele Assessment Note  Patient is a 55 year old African American male that reports suicidal ideation with a plan to shot himself.   Patient reports that when he was at a friend's home he had access to a gun.   Patient reports that he does not own a gun.  Patient reports increased depression associated with his inability to stop using cocaine.  Patient reports that he has been using cocaine heavily for a month.  Patient reports using two grams of cocaine daily.    Patient reports prior inpatient hospitalization in a psychiatric hospital and inpatient substance abuse facility.    Patient denies HI/Psychosis.      Diagnosis: Major Depressive Disorder and Cocaine Abuse   Past Medical History:  Past Medical History:  Diagnosis Date  . Diabetes mellitus without complication [facility])     Past Surgical History:  Procedure Laterality Date  . FINGER SURGERY      Family History:  Family History  Problem Relation Age of Onset  . Diabetes Other   . Hypertension Other     Social History:  reports that he has been smoking Cigarettes.  He has a 11.50 pack-year smoking history. He has never used smokeless tobacco. He reports that he drinks alcohol. He reports that he uses drugs, including Cocaine and "Crack" cocaine, about 3 times per week.  Additional Social History:  Alcohol / Drug Use History of alcohol / drug use?: Yes Longest period of sobriety (when/how long): 5 and a half years  Negative Consequences of Use: Work / Programmer, multimedia, Copywriter, advertising relationships, Surveyor, quantity Withdrawal Symptoms: Agitation, Sweats Substance #1 Name of Substance 1: Cocaine 1 - Age of First Use: 16 1 - Amount (size/oz): 2 grams 1 - Frequency: Daily 1 - Duration: For the past 2 months.  1 - Last Use / Amount: 2 grams   CIWA: CIWA-Ar BP: 142/95 Pulse Rate: 90 COWS:    PATIENT STRENGTHS: (choose at least two) Average or above average intelligence Capable of independent living Communication skills Physical  Health Supportive family/friends  Allergies: No Known Allergies  Home Medications:  (Not in a hospital admission)  OB/GYN Status:  No LMP for male patient.  General Assessment Data Location of Assessment: [facility] ED TTS Assessment: In system Is this a Tele or Face-to-Face Assessment?: Tele Assessment Is this an Initial Assessment or a Re-assessment for this encounter?: Initial Assessment Marital status: Married (Married for 35 years) [place] name: NA Is patient pregnant?: No Pregnancy Status: No Living Arrangements:  (Wife and 30 yr old daughter) Can pt return to current living arrangement?: Yes Admission Status: Voluntary Is patient capable of signing voluntary admission?: Yes Referral Source: Self/Family/Friend Insurance type: Self Pay     Crisis Care Plan Living Arrangements:  (Wife and 69 yr old daughter) Legal Guardian:  (NA) Name of Psychiatrist: None Reported Name of Therapist: None Reported  Education Status Is patient currently in school?: No Current Grade: NA Highest grade of school patient has completed: 12 Name of school: NA Contact person: NA  Risk to self with the past 6 months Suicidal Ideation: Yes-Currently Present Has patient been a risk to self within the past 6 months prior to admission? : Yes Suicidal Intent: Yes-Currently Present Has patient had any suicidal intent within the past 6 months prior to admission? : Yes Is patient at risk for suicide?: Yes Suicidal Plan?: Yes-Currently Present Has patient had any suicidal plan within the past 6 months prior to admission? : Yes Specify Current Suicidal Plan: Shoot himself with  a gun Access to Means: Yes Specify Access to Suicidal Means: Gun at a friends home What has been your use of drugs/alcohol within the last 12 months?: Cocaine  Previous Attempts/Gestures: No How many times?: 0 Other Self Harm Risks: None Reported Triggers for Past Attempts:  (NA) Intentional Self Injurious Behavior:  None Family Suicide History: Yes Recent stressful life event(s): Other (Comment), Job Loss, Financial Problems (Unable to stop using drugs) Persecutory voices/beliefs?: No Depression: Yes Depression Symptoms: Despondent, Insomnia, Tearfulness, Isolating, Fatigue, Guilt, Loss of interest in usual pleasures, Feeling worthless/self pity, Feeling angry/irritable Substance abuse history and/or treatment for substance abuse?: Yes Suicide prevention information given to non-admitted patients: Yes  Risk to Others within the past 6 months Homicidal Ideation: No Does patient have any lifetime risk of violence toward others beyond the six months prior to admission? : No Thoughts of Harm to Others: No Current Homicidal Intent: No Current Homicidal Plan: No Access to Homicidal Means: No Identified Victim: NA History of harm to others?: No Assessment of Violence: None Noted Violent Behavior Description: n Does patient have access to weapons?: Yes (Comment) Criminal Charges Pending?: No Describe Pending Criminal Charges: NA Does patient have a court date: No Court Date:  (NA) Is patient on probation?: No  Psychosis Hallucinations: None noted Delusions: None noted  Mental Status Report Appearance/Hygiene: Disheveled Eye Contact: Good Motor Activity: Freedom of movement Speech: Logical/coherent Level of Consciousness: Alert Mood: Depressed, Despair Affect: Blunted, Sad Anxiety Level: None Thought Processes: Coherent, Relevant Judgement: Impaired Orientation: Person, Place, Situation, Time Obsessive Compulsive Thoughts/Behaviors: None  Cognitive Functioning Concentration: Decreased Memory: Recent Intact, Remote Intact IQ: Average Insight: Fair Impulse Control: Fair Appetite: Fair Weight Loss: 0 Weight Gain: 0 Sleep: Decreased Total Hours of Sleep: 6 Vegetative Symptoms: Decreased grooming, Not bathing, Staying in bed  ADLScreening [facility]) Patient's cognitive  ability adequate to safely complete daily activities?: Yes Patient able to express need for assistance with ADLs?: Yes Independently performs ADLs?: Yes (appropriate for developmental age)  Prior Inpatient Therapy Prior Inpatient Therapy: Yes Prior Therapy Dates: 2012 - 2017 Prior Therapy Facilty/Provider(s): [facility](multiple), [facility] Reason for Treatment: substance abuse  Prior Outpatient Therapy Prior Outpatient Therapy: No Prior Therapy Dates: NA Prior Therapy Facilty/Provider(s): NA Reason for Treatment: NA Does patient have an ACCT team?: No Does patient have Intensive In-House Services?  : No Does patient have [facility] services? : No Does patient have P4CC services?: No  ADL Screening (condition at time of admission) Patient's cognitive ability adequate to safely complete daily activities?: Yes Is the patient deaf or have difficulty hearing?: No Does the patient have difficulty seeing, even when wearing glasses/contacts?: No Does the patient have difficulty concentrating, remembering, or making decisions?: No Patient able to express need for assistance with ADLs?: Yes Does the patient have difficulty dressing or bathing?: No Independently performs ADLs?: Yes (appropriate for developmental age) Does the patient have difficulty walking or climbing stairs?: No Weakness of Legs: None Weakness of Arms/Hands: None  Home Assistive Devices/Equipment Home Assistive Devices/Equipment: None    Abuse/Neglect Assessment (Assessment to be complete while patient is alone) Physical Abuse: Denies Verbal Abuse: Denies Sexual Abuse: Denies Exploitation of patient/patient's resources: Denies Self-Neglect: Denies Values / Beliefs Cultural Requests During Hospitalization: None Spiritual Requests During Hospitalization: None Consults Spiritual Care Consult Needed: No Social Work Consult Needed: No Merchant navy officer (For Healthcare) Does patient have an advance directive?: No Would  patient like information on creating an advanced directive?: No - patient declined information  Additional Information 1:1 In Past 12 Months?: No CIRT Risk: No Elopement Risk: No Does patient have medical clearance?: Yes     Disposition: Per Mliss Saxaskia, NP - patient meets criteria for inpatient hospitalization.  TTS will seek placement.  Disposition Initial Assessment Completed for this Encounter: Yes  Linton RumpStevenson, Ianna Salmela LaVerne 12/11/2015 8:28 AM

## 2015-12-11 NOTE — ED Notes (Signed)
Pt taking shower at this time  

## 2015-12-11 NOTE — ED Notes (Signed)
Pt. Belongings (clothing and shoes) given to [employer] to inventory.

## 2015-12-12 ENCOUNTER — Encounter (HOSPITAL_COMMUNITY): Payer: Self-pay | Admitting: Psychiatry

## 2015-12-12 DIAGNOSIS — F332 Major depressive disorder, recurrent severe without psychotic features: Principal | ICD-10-CM

## 2015-12-12 DIAGNOSIS — R45851 Suicidal ideations: Secondary | ICD-10-CM

## 2015-12-12 DIAGNOSIS — F1010 Alcohol abuse, uncomplicated: Secondary | ICD-10-CM | POA: Diagnosis present

## 2015-12-12 DIAGNOSIS — F322 Major depressive disorder, single episode, severe without psychotic features: Secondary | ICD-10-CM | POA: Diagnosis present

## 2015-12-12 DIAGNOSIS — Z87898 Personal history of other specified conditions: Secondary | ICD-10-CM | POA: Diagnosis present

## 2015-12-12 LAB — GLUCOSE, CAPILLARY
GLUCOSE-CAPILLARY: 264 mg/dL — AB (ref 65–99)
GLUCOSE-CAPILLARY: 262 mg/dL — AB (ref 65–99)
Glucose-Capillary: 189 mg/dL — ABNORMAL HIGH (ref 65–99)
Glucose-Capillary: 169 mg/dL — ABNORMAL HIGH (ref 65–99)

## 2015-12-12 MED ORDER — INSULIN GLARGINE 100 UNIT/ML ~~LOC~~ SOLN
10.0000 [IU] | Freq: Every day | SUBCUTANEOUS | Status: DC
  Administered 2015-12-12 – 2015-12-14 (×3): 10 [IU] via SUBCUTANEOUS

## 2015-12-12 MED ORDER — ALUM & MAG HYDROXIDE-SIMETH 200-200-20 MG/5ML PO SUSP: 30.0000 mL | ORAL | Status: DC | PRN

## 2015-12-12 MED ORDER — INSULIN ASPART 100 UNIT/ML ~~LOC~~ SOLN
0.0000 [IU] | Freq: Every day | SUBCUTANEOUS | Status: DC
  Administered 2015-12-12: 3 [IU] via SUBCUTANEOUS
  Administered 2015-12-13: 2 [IU] via SUBCUTANEOUS
  Administered 2015-12-14: 3 [IU] via SUBCUTANEOUS

## 2015-12-12 MED ORDER — TRAZODONE HCL 50 MG PO TABS
50.0000 mg | ORAL_TABLET | Freq: Every evening | ORAL | Status: DC | PRN
  Administered 2015-12-12 – 2015-12-15 (×4): 50 mg via ORAL
  Filled 2015-12-12: qty 1
  Filled 2015-12-12: qty 14
  Filled 2015-12-12: qty 1
  Filled 2015-12-12: qty 14
  Filled 2015-12-12 (×9): qty 1

## 2015-12-12 MED ORDER — INSULIN ASPART 100 UNIT/ML ~~LOC~~ SOLN: 0.0000 [IU] | Freq: Three times a day (TID) | SUBCUTANEOUS | Status: DC

## 2015-12-12 MED ORDER — METFORMIN HCL 500 MG PO TABS
1000.0000 mg | ORAL_TABLET | Freq: Two times a day (BID) | ORAL | Status: DC
  Administered 2015-12-12 – 2015-12-16 (×9): 1000 mg via ORAL
  Filled 2015-12-12: qty 2
  Filled 2015-12-12: qty 28
  Filled 2015-12-12 (×2): qty 2
  Filled 2015-12-12: qty 28
  Filled 2015-12-12 (×10): qty 2

## 2015-12-12 MED ORDER — BUPROPION HCL ER (XL) 150 MG PO TB24
150.0000 mg | ORAL_TABLET | Freq: Every day | ORAL | Status: DC
  Administered 2015-12-12 – 2015-12-16 (×5): 150 mg via ORAL
  Filled 2015-12-12 (×2): qty 1
  Filled 2015-12-12: qty 7
  Filled 2015-12-12 (×6): qty 1

## 2015-12-12 MED ORDER — INSULIN ASPART 100 UNIT/ML ~~LOC~~ SOLN
0.0000 [IU] | Freq: Three times a day (TID) | SUBCUTANEOUS | Status: DC
  Administered 2015-12-12: 5 [IU] via SUBCUTANEOUS
  Administered 2015-12-12: 2 [IU] via SUBCUTANEOUS
  Administered 2015-12-13: 7 [IU] via SUBCUTANEOUS
  Administered 2015-12-13: 3 [IU] via SUBCUTANEOUS
  Administered 2015-12-13: 2 [IU] via SUBCUTANEOUS
  Administered 2015-12-14: 9 [IU] via SUBCUTANEOUS
  Administered 2015-12-14: 5 [IU] via SUBCUTANEOUS
  Administered 2015-12-14: 2 [IU] via SUBCUTANEOUS
  Administered 2015-12-15: 3 [IU] via SUBCUTANEOUS
  Administered 2015-12-15 (×2): 5 [IU] via SUBCUTANEOUS

## 2015-12-12 MED ORDER — HYDROXYZINE HCL 25 MG PO TABS
25.0000 mg | ORAL_TABLET | Freq: Three times a day (TID) | ORAL | Status: DC | PRN
  Administered 2015-12-12 – 2015-12-15 (×4): 25 mg via ORAL
  Filled 2015-12-12 (×2): qty 1
  Filled 2015-12-12: qty 10
  Filled 2015-12-12 (×2): qty 1

## 2015-12-12 MED ORDER — ACETAMINOPHEN 325 MG PO TABS: 650.0000 mg | ORAL_TABLET | Freq: Four times a day (QID) | ORAL | Status: DC | PRN

## 2015-12-12 MED ORDER — NICOTINE 21 MG/24HR TD PT24
21.0000 mg | MEDICATED_PATCH | Freq: Every day | TRANSDERMAL | Status: DC
  Filled 2015-12-12 (×5): qty 1

## 2015-12-12 MED ORDER — MAGNESIUM HYDROXIDE 400 MG/5ML PO SUSP: 30.0000 mL | Freq: Every day | ORAL | Status: DC | PRN

## 2015-12-12 NOTE — Plan of Care (Signed)
Problem: Safety: Goal: Periods of time without injury will increase Outcome: Progressing Pt remains safe on and off unit without self harm gestures to note thus far.   Problem: Health Behavior/Discharge Planning: Goal: Compliance with therapeutic regimen will improve Outcome: Progressing Pt compliant with medications as ordered, denies adverse drug reactions when assessed.

## 2015-12-12 NOTE — [facility] Suicide Risk Assessment (Signed)
[facility] INPATIENT:  Family/Significant Other Suicide Prevention Education  Suicide Prevention Education:  Patient Refusal for Family/Significant Other Suicide Prevention Education: The patient James [name] has refused to provide written consent for family/significant other to be provided Family/Significant Other Suicide Prevention Education during admission and/or prior to discharge.  Physician notified.  Baldo Daub [facility] 12/12/2015, 4:09 PM

## 2015-12-12 NOTE — Tx Team (Signed)
Interdisciplinary Treatment and Diagnostic Plan Update  12/12/2015 Time of Session: 9:30am James [name] MRN: [id number]  Principal Diagnosis: MDD (major depressive disorder), recurrent severe, without psychosis ([place])  Secondary Diagnoses: Active Problems:   MDD (major depressive disorder), severe (HCC)   Current Medications:  Current Facility-Administered Medications  Medication Dose Route Frequency Provider Last Rate Last Dose  . acetaminophen (TYLENOL) tablet 650 mg  650 mg Oral Q6H PRN Nanci Pina, FNP      . alum & mag hydroxide-simeth (MAALOX/MYLANTA) 200-200-20 MG/5ML suspension 30 mL  30 mL Oral Q4H PRN Nanci Pina, FNP      . hydrOXYzine (ATARAX/VISTARIL) tablet 25 mg  25 mg Oral TID PRN Nanci Pina, FNP      . magnesium hydroxide (MILK OF MAGNESIA) suspension 30 mL  30 mL Oral Daily PRN Nanci Pina, FNP      . nicotine (NICODERM CQ - dosed in mg/24 hours) patch 21 mg  21 mg Transdermal Q0600 Nanci Pina, FNP      . traZODone (DESYREL) tablet 50 mg  50 mg Oral QHS,MR X 1 Nanci Pina, FNP       PTA Medications: Prescriptions Prior to Admission  Medication Sig Dispense Refill Last Dose  . buPROPion (WELLBUTRIN XL) 150 MG 24 hr tablet Take 1 tablet (150 mg total) by mouth daily. (Patient not taking: Reported on 12/11/2015) 14 tablet 0 Not Taking at Unknown time  . insulin aspart (NOVOLOG) 100 UNIT/ML injection Inject 0-15 Units into the skin 3 (three) times daily with meals. SEE SLIDING SCALE FROM [facility] (Patient not taking: Reported on 12/11/2015) 10 mL 0 Not Taking at Unknown time  . insulin aspart (NOVOLOG) 100 UNIT/ML injection Inject 0-5 Units into the skin at bedtime. (Patient not taking: Reported on 12/11/2015) 10 mL 0 Not Taking at Unknown time  . insulin glargine (LANTUS) 100 UNIT/ML injection Inject 0.1 mLs (10 Units total) into the skin at bedtime. (Patient not taking: Reported on 12/11/2015) 10 mL 0 Not Taking at Unknown time  . metFORMIN (GLUCOPHAGE)  1000 MG tablet Take 1 tablet (1,000 mg total) by mouth 2 (two) times daily with a meal. For diabetes management 42 tablet 1 12/10/2015 at Unknown time  . nicotine (NICODERM CQ - DOSED IN MG/24 HOURS) 21 mg/24hr patch Place 1 patch (21 mg total) onto the skin daily at 6 (six) AM. For smoking cessation (Patient taking differently: Place 21 mg onto the skin daily as needed (for smoking cessation). For smoking cessation) 28 patch 0 unk  . traZODone (DESYREL) 50 MG tablet Take 1 tablet (50 mg total) by mouth at bedtime as needed for sleep. 14 tablet 0 unk    Treatment Modalities: Medication Management, Group therapy, Case management,  1 to 1 session with clinician, Psychoeducation, Recreational therapy.   Physician Treatment Plan for Primary Diagnosis: MDD (major depressive disorder), recurrent severe, without psychosis ([place]) Long Term Goal(s): Improvement in symptoms so as ready for discharge   Short Term Goals: Ability to identify changes in lifestyle to reduce recurrence of condition will improve, Ability to identify and develop effective coping behaviors will improve and Ability to identify triggers associated with substance abuse/mental health issues will improve  Medication Management: Evaluate patient's response, side effects, and tolerance of medication regimen.  Therapeutic Interventions: 1 to 1 sessions, Unit Group sessions and Medication administration.  Evaluation of Outcomes: Not Met   RN Treatment Plan for Primary Diagnosis: MDD (major depressive disorder), recurrent severe, without psychosis ([place]) Long  Term Goal(s): Knowledge of disease and therapeutic regimen to maintain health will improve  Short Term Goals: Ability to remain free from injury will improve, Ability to disclose and discuss suicidal ideas and Ability to identify and develop effective coping behaviors will improve  Medication Management: RN will administer medications as ordered by provider, will assess and evaluate  patient's response and provide education to patient for prescribed medication. RN will report any adverse and/or side effects to prescribing provider.  Therapeutic Interventions: 1 on 1 counseling sessions, Psychoeducation, Medication administration, Evaluate responses to treatment, Monitor vital signs and CBGs as ordered, Perform/monitor CIWA, COWS, AIMS and Fall Risk screenings as ordered, Perform wound care treatments as ordered.  Evaluation of Outcomes: Not Met   LCSW Treatment Plan for Primary Diagnosis: MDD (major depressive disorder), recurrent severe, without psychosis ([place]) Long Term Goal(s): Safe transition to appropriate next level of care at discharge, Engage patient in therapeutic group addressing interpersonal concerns.  Short Term Goals: Engage patient in aftercare planning with referrals and resources, Increase emotional regulation and Facilitate patient progression through stages of change regarding substance use diagnoses and concerns  Therapeutic Interventions: Assess for all discharge needs, 1 to 1 time with Social worker, Explore available resources and support systems, Assess for adequacy in community support network, Educate family and significant other(s) on suicide prevention, Complete Psychosocial Assessment, Interpersonal group therapy.  Evaluation of Outcomes: Not Met    Progress in Treatment :  Attending groups: Continuing to assess  Participating in groups: Continuing to assess  Taking medication as prescribed: Yes, MD continuing to assess for appropriate medication regimen  Toleration medication: Yes  Family/Significant other contact made: No Pt declined   Patient understands diagnosis: Yes AEB asking for help with SI, substance abuse  Discussing patient identified problems/goals with staff: Yes  Medical problems stabilized or resolved: Yes  Denies suicidal/homicidal ideation: Yes  Issues/concerns per patient self-inventory: None reported  Other:  N/A  New problem(s) identified: None reported at this time    New Short Term/Long Term Goal(s): None at this time    Discharge Plan or Barriers: Pt asking for referral to [facility]; no plan B    Reason for Continuation of Hospitalization:  Depression Medication stabilization Suicidal Ideations Withdrawal symptoms  Estimated Length of Stay: 3-5 days    Attendees:  Patient:   Physician: Dr. Modesta Messing , MD  12/12/2015   9:30am  Nursing: Phillis Haggis., Jan Wright  12/12/2015 9:30am  RN Care Manager: Lars Pinks, [place]  12/12/2015 9:30am  Social Workers: Erasmo Downer Tearah Saulsbury, [employer],  12/12/2015 9:30am  Nurse Pratictioners:  Scribe for Treatment Team: Tilden Fossa, [place] Worker [facility] [phone]

## 2015-12-12 NOTE — Progress Notes (Signed)
D.  Pt pleasant on approach, denies complaints at this time.  Positive for evening AA group, observed talking with speaker appropriately after group.  Pt observed interacting appropriately with peers on the unit.  Pt denies SI/HI/hallucinations at this time.  A.  Support and encouragement offered, medication given as ordered.  R.  Pt remains safe on the unit, will continue to monitor.

## 2015-12-12 NOTE — [facility] Group Notes (Signed)
[facility] LCSW Group Therapy 12/12/2015  1:15 PM   Type of Therapy: Group Therapy  Participation Level: Did Not Attend. Patient invited to participate but declined.   Samuella Bruin, MSW, LCSW Clinical Social Worker [facility] [phone]

## 2015-12-12 NOTE — Progress Notes (Signed)
Admission Note:  55 yr male who presents  in no acute distress for the treatment of SI and Depression. Pt appears flat and depressed. Pt was anxious and cooperative with admission process. Pt presents with passive SI and contracts for safety upon admission. Pt denies AVH .  Skin was assessed and found to be clear of any abnormal marks. Pt. searched and no contraband found, POC and unit policies explained and understanding verbalized. Consents obtained. Food and fluids offered, and accepted. Pt had no additional questions or concerns.

## 2015-12-12 NOTE — Progress Notes (Signed)
NUTRITION ASSESSMENT  Pt identified as at risk on the Malnutrition Screen Tool  INTERVENTION: 1. Educated patient on the importance of nutrition and encouraged intake of food and beverages. 2. Discussed weight goals. 3. Supplements: none at this time.  NUTRITION DIAGNOSIS: No nutrition dx at this time.  Goal: Pt to meet >/= 90% of their estimated nutrition needs.  Monitor:  PO intake  Assessment:  Pt admitted for SI with a plan, cocaine abuse. He was unsure of any recent weight fluctuations. Chart review indicates that pt lost 10 lbs (7% body weight) in the 5 months PTA which is not significant for time frame.   Pt with hx of Type 2 DM and DM Coordinator saw pt yesterday. Pt currently on insulin regimen.   55 y.o. male  Height: Ht Readings from Last 1 Encounters:  12/11/15 5' 10.5" (1.791 m)    Weight: Wt Readings from Last 1 Encounters:  12/11/15 136 lb (61.7 kg)    Weight Hx: Wt Readings from Last 10 Encounters:  12/11/15 136 lb (61.7 kg)  12/11/15 134 lb (60.8 kg)  12/05/15 134 lb (60.8 kg)  12/04/15 134 lb 9.6 oz (61.1 kg)  06/25/15 144 lb 4 oz (65.4 kg)  06/06/15 143 lb (64.9 kg)  06/06/15 138 lb (62.6 kg)  05/28/15 138 lb (62.6 kg)  05/27/15 138 lb 8 oz (62.8 kg)  12/07/12 148 lb (67.1 kg)    BMI:  Body mass index is 19.24 kg/m. Pt meets criteria for normal weight based on current BMI.  Estimated Nutritional Needs: Kcal: 25-30 kcal/kg Protein: > 1 gram protein/kg Fluid: 1 ml/kcal  Diet Order: Diet Carb Modified Fluid consistency: Thin; Room service appropriate? Yes Pt is also offered choice of unit snacks mid-morning and mid-afternoon.  Pt is eating as desired.   Lab results and medications reviewed.    Trenton Gammon, MS, RD, LDN Inpatient Clinical Dietitian Pager # [phone] After hours/weekend pager # [phone]

## 2015-12-12 NOTE — Progress Notes (Signed)
D: Pt presents with depressed affect and mood. Pt is guarded but brightens up during conversation / interactions. Isolative to room, OOB for medications and meals. Denies SI, HI, AVH and pain when assessed. Verbalized the importance of him seeking help, Stated to writer "I really need to get help, I have two beautiful grand daughters and expecting another one, plus I've been doing this since I was 55 years old, nothing is changing for the better", " I go for treatments but I've never really follow up with treatment once I leave".  A: Scheduled given as ordered. Emotional support and availability provided to pt. Encouraged to voice concerns / needs. Safety maintained on Q 15 minutes checks without self harm gestures or outburst to note thus far this shift.  R: Pt receptive to care. Attends scheduled groups. Compliant with medication regimen, denies adverse drug reactions. Remains safe on and off unit.

## 2015-12-12 NOTE — [facility] Suicide Risk Assessment (Signed)
[facility] Admission Suicide Risk Assessment   Nursing information obtained from:  Patient Demographic factors:  Unemployed Current Mental Status:  Suicidal ideation indicated by patient, Self-harm thoughts, Suicide plan (plan to shoot self) Loss Factors:  NA Historical Factors:  Prior suicide attempts Risk Reduction Factors:  Sense of responsibility to family  Total Time spent with patient: 30 minutes Principal Problem: MDD (major depressive disorder), recurrent severe, without psychosis (HCC) Diagnosis:   Patient Active Problem List   Diagnosis Date Noted  . 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 Diabetes mellitus (HCC) [E11.9] 12/12/2015  . MDD (major depressive disorder), recurrent severe, without psychosis (HCC) [F33.2] 06/26/2015  . Cocaine use disorder, severe, dependence (HCC) [F14.20] 06/25/2015  . 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 MUSCLE SPASM, BACK [M53.80] 02/12/2009  . 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 HYPERCHOLESTEROLEMIA [E78.00] 05/09/2007  . SINUSITIS, CHRONIC [J32.9] 05/01/2007  . ALLERGIC RHINITIS [J30.9] 12/21/2006  . DIABETES MELLITUS, TYPE II [E11.9] 12/07/2006  . HYPERTENSION [I10] 12/07/2006   Subjective Data: Please see H&P.   Continued Clinical Symptoms:  Alcohol Use Disorder Identification Test Final Score (AUDIT): 5 The "Alcohol Use Disorders Identification Test", Guidelines for Use in Primary Care, Second Edition.  World Science writer [facility]). Score between 0-7:  no or low risk or alcohol related problems. Score between 8-15:  moderate risk of alcohol related problems. Score between 16-19:  high risk of alcohol related problems. Score 20 or above:  warrants further diagnostic evaluation for alcohol dependence and treatment.   CLINICAL FACTORS:   Alcohol/Substance Abuse/Dependencies Unstable or Poor Therapeutic Relationship Previous Psychiatric Diagnoses and  Treatments Medical Diagnoses and Treatments/Surgeries   Musculoskeletal: Strength & Muscle Tone: within normal limits Gait & Station: normal Patient leans: N/A  Psychiatric Specialty Exam: Physical Exam  ROS  Blood pressure 135/74, pulse 89, temperature 97.7 F (36.5 C), temperature source Oral, resp. rate 16, height 5' 10.5" (1.791 m), weight 61.7 kg (136 lb).Body mass index is 19.24 kg/m.          Please see H&P.                                                 COGNITIVE FEATURES THAT CONTRIBUTE TO RISK:  Closed-mindedness, Polarized thinking and Thought constriction (tunnel vision)    SUICIDE RISK:   Moderate:  Frequent suicidal ideation with limited intensity, and duration, some specificity in terms of plans, no associated intent, good self-control, limited dysphoria/symptomatology, some risk factors present, and identifiable protective factors, including available and accessible social support.   PLAN OF CARE: Please see H&P.   I certify that inpatient services furnished can reasonably be expected to improve the patient's condition.  Rodman Recupero, MD 12/12/2015, 10:18 AM

## 2015-12-12 NOTE — H&P (Signed)
Psychiatric Admission Assessment Adult  Patient Identification: James [name] MRN:  [id number] Date of Evaluation:  12/12/2015 Chief Complaint: Patient states " I am depressed, its my addiction, I need help .'   Principal Diagnosis: MDD (major depressive disorder), recurrent severe, without psychosis (HCC) Diagnosis:   Patient Active Problem List   Diagnosis Date Noted  . 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 Diabetes mellitus (HCC) [E11.9] 12/12/2015  . MDD (major depressive disorder), recurrent severe, without psychosis (HCC) [F33.2] 06/26/2015  . Cocaine use disorder, severe, dependence (HCC) [F14.20] 06/25/2015  . 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 MUSCLE SPASM, BACK [M53.80] 02/12/2009  . 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 HYPERCHOLESTEROLEMIA [E78.00] 05/09/2007  . SINUSITIS, CHRONIC [J32.9] 05/01/2007  . ALLERGIC RHINITIS [J30.9] 12/21/2006  . DIABETES MELLITUS, TYPE II [E11.9] 12/07/2006  . HYPERTENSION [I10] 12/07/2006   History of Present Illness:: Patient is a 55 year old [place] American male, married , lives in [place] with his wife and daughter , is unemployed , has a hx of depression , cocaine and alcohol abuse, presented to [facility] with SI with plan to use a firearm on himself.   Per initial notes in EHR :" Pt reported suicidal ideation with a plan to shot himself.   Patient reports that when he was at a friend's home he had access to a gun. Patient reports that he does not own a gun.  Patient reports increased depression associated with his inability to stop using cocaine.  Patient reports that he has been using cocaine heavily for a month.  Patient reports using two grams of cocaine daily.  Patient reports prior inpatient hospitalization in a psychiatric hospital and inpatient substance abuse facility.      Patient seen and chart reviewed today .Discussed patient with treatment  team. Patient today is seen as depressed, withdrawn , lethargic , has poor eye contact, reports feeling SI and that he had plan to shoot self. He states he really wants to get help since he thinks about his grand daughters and that makes him want to get help. Pt reports feeling sad most of the day, has sleep issues , loss of appetite and problem with concentration. Pt also reports anxiety sx on and off. Pt denies AH/VH/HI or paranoia. Pt reports that he stopped his wellbutrin , but it was helpful when he was on it.  Pt reports abusing cocaine since the past several years. Pt reports that he abuses 1-2 gms daily . Pt reports he went to [facility] in march 2017- stayed there and completed the program. He was able to stay sober for a month , then relapsed again. Pt reports he has had a sober period of 5 clean years in the past- however his father passed away from brain cancer 4 years ago and he could not accept his death , so he went back to abusing it again. Pt reports occasional use of alcohol , states he does not have a problem with that anymore.  Pt is very motivated to get help with his substance abuse issues.   Pt also has diabetes, not compliant with medications - reports he is unable to afford it. Will get diabetic coordinator consult.    Associated Signs/Symptoms: Depression Symptoms:  depressed mood, anhedonia, fatigue, (Hypo) Manic Symptoms:  Impulsivity, Irritable Mood, Anxiety Symptoms:  Excessive Worry, Psychotic Symptoms:  denies PTSD Symptoms: Negative Total Time spent with patient: 45 minutes  Past Psychiatric History: Patient  with hx of depression , was admitted at [facility] in the past - most recently in OBS unit end of august 2017. Pt is noncompliant with medications. Pt has been to [facility] in march 2017, [facility] , [facility] . Denies past suicide attempts.  Is the patient at risk to self? Yes.    Has the patient been a risk to self in the past 6 months? No.  Has the patient been a  risk to self within the distant past? No.  Is the patient a risk to others? No.  Has the patient been a risk to others in the past 6 months? No.  Has the patient been a risk to others within the distant past? No.   Prior Inpatient Therapy:   see above Prior Outpatient Therapy:  Not currently  Alcohol Screening: 1. How often do you have a drink containing alcohol?: Monthly or less 2. How many drinks containing alcohol do you have on a typical day when you are drinking?: 3 or 4 3. How often do you have six or more drinks on one occasion?: Less than monthly Preliminary Score: 2 4. How often during the last year have you found that you were not able to stop drinking once you had started?: Less than monthly 5. How often during the last year have you failed to do what was normally expected from you becasue of drinking?: Less than monthly 6. How often during the last year have you needed a first drink in the morning to get yourself going after a heavy drinking session?: Never 7. How often during the last year have you had a feeling of guilt of remorse after drinking?: Never 8. How often during the last year have you been unable to remember what happened the night before because you had been drinking?: Never 9. Have you or someone else been injured as a result of your drinking?: No 10. Has a relative or friend or a doctor or another health worker been concerned about your drinking or suggested you cut down?: No Alcohol Use Disorder Identification Test Final Score (AUDIT): 5 Brief Intervention: AUDIT score less than 7 or less-screening does not suggest unhealthy drinking-brief intervention not indicated Substance Abuse History in the last 12 months:  Yes.  cocaine - heavy abuse daily - several years, alcohol occasional Consequences of Substance Abuse: Legal Consequences: has legal charges Previous Psychotropic Medications: Yes wellbutrin Psychological Evaluations: No  Past Medical History:  Past  Medical History:  Diagnosis Date  . Diabetes mellitus without complication [facility])     Past Surgical History:  Procedure Laterality Date  . FINGER SURGERY     Family History:  Family History  Problem Relation Age of Onset  . Diabetes Other   . Hypertension Other   . Diabetes Mother   . Brain cancer Father   . Mental illness Brother   . Drug abuse Brother   . 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 History  Alcohol Use  . Yes     History  Drug Use  . Frequency: 3.0  times per week  . Types: Cocaine, "Crack" cocaine       Additional Social History:                           Allergies:  No Known Allergies Lab Results:  Results for orders placed or performed during the hospital encounter of 12/11/15 (from the past 48 hour(s))  Glucose, capillary     Status: Abnormal   Collection Time: 12/12/15  6:07 AM  Result Value Ref Range   Glucose-Capillary 189 (H) 65 - 99 mg/dL    Blood Alcohol level:  Lab Results  Component Value Date   ETH <5 12/11/2015   ETH <5 12/04/2015    Metabolic Disorder Labs:  Lab Results  Component Value Date   HGBA1C 14.3 (H) 05/29/2015   MPG 364 05/29/2015   MPG 272 (H) 12/07/2012   No results found for: PROLACTIN Lab Results  Component Value Date   CHOL 173 08/16/2008   TRIG 34 08/16/2008   HDL 76 08/16/2008   CHOLHDL 2.3 Ratio 08/16/2008   VLDL 7 08/16/2008   LDLCALC 90 08/16/2008   LDLCALC 98 12/20/2007    Current Medications: Current Facility-Administered Medications  Medication Dose Route  Frequency Provider Last Rate Last Dose  . acetaminophen (TYLENOL) tablet 650 mg  650 mg Oral Q6H PRN Truman Hayward, FNP      . alum & mag hydroxide-simeth (MAALOX/MYLANTA) 200-200-20 MG/5ML suspension 30 mL  30 mL Oral Q4H PRN Truman Hayward, FNP      . buPROPion (WELLBUTRIN XL) 24 hr tablet 150 mg  150 mg Oral Daily Providencia Hottenstein, MD      . hydrOXYzine (ATARAX/VISTARIL) tablet 25 mg  25 mg Oral TID PRN Truman Hayward, FNP      . insulin aspart (novoLOG) injection 0-5 Units  0-5 Units Subcutaneous QHS Brittiny Levitz, MD      . insulin aspart (novoLOG) injection 0-9 Units  0-9 Units Subcutaneous TID WC Nyomie Ehrlich, MD      . insulin glargine (LANTUS) injection 10 Units  10 Units Subcutaneous QHS Bradyn Soward, MD      . magnesium hydroxide (MILK OF MAGNESIA) suspension 30 mL  30 mL Oral Daily PRN Truman Hayward, FNP      . metFORMIN (GLUCOPHAGE) tablet 1,000 mg  1,000 mg Oral BID WC Edilia Ghuman, MD      . nicotine (NICODERM CQ - dosed in mg/24 hours) patch 21 mg  21 mg Transdermal Q0600 Truman Hayward, FNP      . traZODone (DESYREL) tablet 50 mg  50 mg Oral QHS,MR X 1 Truman Hayward, FNP       PTA Medications: Prescriptions Prior to Admission  Medication Sig Dispense Refill Last Dose  . buPROPion (WELLBUTRIN XL) 150 MG 24 hr tablet Take 1 tablet (150 mg total) by mouth daily. (Patient not taking: Reported on 12/11/2015) 14 tablet 0 Not Taking at Unknown time  . insulin aspart (NOVOLOG) 100 UNIT/ML injection Inject 0-15 Units into the skin 3 (three) times daily with meals. SEE SLIDING SCALE FROM [facility] (Patient not taking: Reported on 12/11/2015) 10 mL 0 Not Taking at Unknown time  . insulin aspart (NOVOLOG) 100 UNIT/ML injection Inject 0-5 Units into the skin at bedtime. (Patient not taking: Reported on 12/11/2015) 10 mL 0 Not Taking at Unknown time  . insulin glargine (LANTUS) 100 UNIT/ML injection Inject 0.1 mLs (10 Units total) into the skin at bedtime. (  Patient not taking: Reported on  12/11/2015) 10 mL 0 Not Taking at Unknown time  . metFORMIN (GLUCOPHAGE) 1000 MG tablet Take 1 tablet (1,000 mg total) by mouth 2 (two) times daily with a meal. For diabetes management 42 tablet 1 12/10/2015 at Unknown time  . nicotine (NICODERM CQ - DOSED IN MG/24 HOURS) 21 mg/24hr patch Place 1 patch (21 mg total) onto the skin daily at 6 (six) AM. For smoking cessation (Patient taking differently: Place 21 mg onto the skin daily as needed (for smoking cessation). For smoking cessation) 28 patch 0 unk  . traZODone (DESYREL) 50 MG tablet Take 1 tablet (50 mg total) by mouth at bedtime as needed for sleep. 14 tablet 0 unk    Musculoskeletal: Strength & Muscle Tone: within normal limits Gait & Station: normal Patient leans: normal  Psychiatric Specialty Exam: Physical Exam  Nursing note and vitals reviewed. Constitutional:  I concur with PE done in ED.    Review of Systems  Constitutional: Positive for malaise/fatigue and weight loss.  HENT: Negative.   Eyes: Negative.   Cardiovascular: Negative.   Genitourinary: Negative.   Musculoskeletal: Negative.   Skin: Negative.   Neurological: Negative.   Endo/Heme/Allergies: Negative.   Psychiatric/Behavioral: Positive for depression, substance abuse and suicidal ideas. The patient is nervous/anxious and has insomnia.   All other systems reviewed and are negative.   Blood pressure 135/74, pulse 89, temperature 97.7 F (36.5 C), temperature source Oral, resp. rate 16, height 5' 10.5" (1.791 m), weight 61.7 kg (136 lb).Body mass index is 19.24 kg/m.  General Appearance: normal  Eye Contact::  Fair  Speech:  Clear and Coherent  Volume:  fluctuates  Mood:  Anxious, Depressed and Dysphoric  Affect:  Flat  Thought Process:  Coherent and Goal Directed at times circumstantial  Orientation:  Full (Time, Place, and Person)  Thought Content:  Rumination  Suicidal Thoughts:  Yes.  with intent/plan  Homicidal Thoughts:  No  Memory:  Immediate;    Fair Recent;   Fair Remote;   Fair  Judgement:  Fair  Insight:  Present and Shallow  Psychomotor Activity:  Restlessness  Concentration:  Fair  Recall:  [employer] of Knowledge:Fair  Language: Fair  Akathisia:  No  Handed:  Right  AIMS (if indicated):     Assets:  Desire for Improvement Housing Social Support  ADL's:  Intact  Cognition: WNL  Sleep:  Number of Hours: 6.75     Treatment Plan Summary: Patient is a 55 year old African American male, married , lives in [place] with his wife and daughter , is unemployed , has a hx of depression , cocaine and alcohol abuse, presented to [facility] with SI with plan to use a firearm on himself.  Patient today appears depressed, anxious , wants to get help with his addiction, is motivated to do so. Will start treatment. Daily contact with patient to assess and evaluate symptoms and progress in treatment and Medication management  Patient will benefit from inpatient treatment and stabilization.  Estimated length of stay is 5-7 days.  Will restart Wellbutrin XL 150 mg po daily for mood sx. Will start Trazodone 50 mg po qhs for sleep. Will make available PRN medications as per agitation protocol. Will start Metformin 1000 mg po bid for DM. Will start Lantus 10 units sq qhs for DM. SSI and CBGs as per unit protocol. Placed diabetic consult. Reviewed past medical records,treatment plan.  Will continue to monitor vitals ,medication compliance and treatment  side effects while patient is here.  Will monitor for medical issues as well as call consult as needed.  Reviewed labs hba1c- 05/29/15- 14.3 , will repeat  , cbc - wnl, cmp - wnl , uds - pos for cocaine , bal<5 .Will get TSH. CSW will start working on disposition.  Patient to participate in therapeutic milieu .      Observation Level/Precautions:  15 minute checks    Psychotherapy:  Individual/group    Consultations:  Diabetic coordinator , CSW  Discharge Concerns:  Need for rehab   Estimated LOS: 3-5 days  Other:     I certify that inpatient services furnished can reasonably be expected to improve the patient's condition.    Jones Viviani, MD 9/1/201710:55 AM

## 2015-12-12 NOTE — Progress Notes (Addendum)
Recreation Therapy Notes  Date:12/12/15 Time:0930 Location: 300 [address] Dayroom  Group Topic: Stress Management  Goal Area(s) Addresses:  Patient will verbalize importance of using healthy stress management.  Patient will identify positive emotions associated with healthy stress management.   Intervention: Stress Management  Activity: Guided Training and development officer. LRT introduced the stress management technique guided imagery. LRT read script that guided patients in how to perform the technique. Patients were to follow along as LRT read the script to engage in the technique.  Education:Stress Management, Discharge Planning.   Education Outcome:Needs additional education  Clinical Observations/Feedback:Pt did not attend group.   Caroll Rancher, LRT/CTRS     Caroll Rancher A 12/12/2015 12:48 PM

## 2015-12-12 NOTE — BHH Counselor (Signed)
Adult Comprehensive Assessment  Patient ID: James [name], male   DOB: 06-Mar-1961, 55 y.o.   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, but fired after about 4-5 months Family Relationships: Patient's addiction has put a strain on his relationship with his wife and family Surveyor, quantity / Lack of resources (include bankruptcy): Financial stressors due to substance abuse Housing / Lack of housing: patient currenlty lives with his wife and adult daughter in [place]  Physical health (include injuries & life threatening diseases): diabetes which is unmanaged, hernia Substance abuse: using crack cocaine daily Bereavement / Loss: patient's father died approximately 5 years ago  Living/Environment/Situation:  Living Arrangements: Spouse/significant other Living conditions (as described by patient or guardian):patient currenlty lives with his wife and adult daughter in [place]  How long has patient lived in current situation?: 17 years What is atmosphere in current home: Comfortable;Loving;Supportive  Family History:  Marital status: Married Number of Years Married: 34 years  What types of issues is patient dealing with in the relationship?: Per patient, his drug use is the major struggle in his relationship Additional relationship information: none Does patient have children?: Yes How many children?: 3  How is patient's relationship with their children?: very close, however relationship now strainded due to his drug use  Childhood History:  By whom was/is the patient raised?: Both parents Description of patient's relationship with caregiver when they were a child: very loving, caring and supportive. Per patient, father kept the family life peaceful Patient's description of current relationship with people who  raised him/her: patient's father died 5 years ago, mother resides in [place] and has very limited contact with her Does patient have siblings?: Yes Number of Siblings: 5  Description of patient's current relationship with siblings: not as close as they reportedly hold grudges against patient for his past behaviors Did patient suffer any verbal/emotional/physical/sexual abuse as a child?: No Did patient suffer from severe childhood neglect?: No Has patient ever been sexually abused/assaulted/raped as an adolescent or adult?: No Was the patient ever a victim of a crime or a disaster?: No Witnessed domestic violence?: No Has patient been effected by domestic violence as an adult?: No  Education:  Highest grade of school patient has completed: 12 Currently a student?: No Name of school: NA Contact person: NA Learning disability?: No  Employment/Work Situation:  Employment situation: Employed Patient's job has been impacted by current illness: no What is the longest time patient has a held a job?: 5 years 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 [employer] Has patient ever been in the [employer]?: No Has patient ever served in combat?: No  Financial Resources:  Financial resources: Income from employment  Does patient have a representative payee or guardian?: No  Alcohol/Substance Abuse:  What has been your use of drugs/alcohol within the last 12 months?: using crack cocaine daily for 30 days If attempted suicide, did drugs/alcohol play a role in this?: Yes Alcohol/Substance Abuse Treatment Hx: Past Tx, Inpatient If yes, describe treatment: [facility] 7 yrs ago, [facility] in [place] several years ago, [facility] detoxes in 2014, [place] in Feb of 2017 Has alcohol/substance abuse ever caused legal problems?: Yes- marijuana and cocaine possession charges   Social Support System:  [employer] Support System: Fair Games developer Support System: Family Type  of faith/religion: Ephriam KnucklesChristian How does patient's faith help to cope with current illness?: "I know God can heal me through prayer"  Leisure/Recreation:  Leisure and Hobbies: fishing  Strengths/Needs:  What things does the patient do well?: "I am a decent person" In what areas does patient struggle / problems for patient: drug addiction, self-esteem and guilt  Discharge Plan:  Does patient have access to transportation?: Yes Will patient be returning to same living situation after discharge?: No  Hopes to get into [facility] Currently receiving community mental health services: No If no, would patient like referral for services when discharged?: Yes (What county?) ([place]) Does patient have financial barriers related to dischargedications?: Yes Patient description of barriers related to discharge medications: No health insurance     Summary/Recommendations:   Summary and Recommendations (to be completed by the evaluator): Dayton ScrapeJohnnie is a 55 YO AA male diagnosed with MDD, severe, without psychosis.  He presents with SI following a month long relapse on crack cocaine.  He is asking for a referral to [facility]-is not interested in [facility] as a plan B.  "I've been there already." He can benefit from crises stabilization, medication management, therapeutic milieu and referral for services.  Ida Rogueodney B Jiovani Mccammon. 12/12/2015

## 2015-12-12 NOTE — Tx Team (Signed)
Initial Treatment Plan 12/12/2015 3:00 AM MANDIP FRAME TAV:[id number]    PATIENT STRESSORS: Financial difficulties Substance abuse   PATIENT STRENGTHS: Capable of independent living General fund of knowledge   PATIENT IDENTIFIED PROBLEMS: "substance abuse"  "depression"                   DISCHARGE CRITERIA:  Improved stabilization in mood, thinking, and/or behavior Motivation to continue treatment in a less acute level of care Reduction of life-threatening or endangering symptoms to within safe limits  PRELIMINARY DISCHARGE PLAN: Return to previous living arrangement  PATIENT/FAMILY INVOLVEMENT: This treatment plan has been presented to and reviewed with the patient, James [name].  The patient and family have been given the opportunity to ask questions and make suggestions.  Curly Rim, RN 12/12/2015, 3:00 AM

## 2015-12-13 DIAGNOSIS — Z808 Family history of malignant neoplasm of other organs or systems: Secondary | ICD-10-CM

## 2015-12-13 DIAGNOSIS — F17210 Nicotine dependence, cigarettes, uncomplicated: Secondary | ICD-10-CM

## 2015-12-13 DIAGNOSIS — Z79899 Other long term (current) drug therapy: Secondary | ICD-10-CM

## 2015-12-13 DIAGNOSIS — F1010 Alcohol abuse, uncomplicated: Secondary | ICD-10-CM

## 2015-12-13 DIAGNOSIS — Z794 Long term (current) use of insulin: Secondary | ICD-10-CM

## 2015-12-13 DIAGNOSIS — F1420 Cocaine dependence, uncomplicated: Secondary | ICD-10-CM

## 2015-12-13 DIAGNOSIS — Z791 Long term (current) use of non-steroidal anti-inflammatories (NSAID): Secondary | ICD-10-CM

## 2015-12-13 DIAGNOSIS — Z8249 Family history of ischemic heart disease and other diseases of the circulatory system: Secondary | ICD-10-CM

## 2015-12-13 DIAGNOSIS — Z813 Family history of other psychoactive substance abuse and dependence: Secondary | ICD-10-CM

## 2015-12-13 DIAGNOSIS — Z818 Family history of other mental and behavioral disorders: Secondary | ICD-10-CM

## 2015-12-13 DIAGNOSIS — Z833 Family history of diabetes mellitus: Secondary | ICD-10-CM

## 2015-12-13 LAB — TSH: TSH: 1.923 u[IU]/mL (ref 0.350–4.500)

## 2015-12-13 LAB — GLUCOSE, CAPILLARY
GLUCOSE-CAPILLARY: 350 mg/dL — AB (ref 65–99)
GLUCOSE-CAPILLARY: 209 mg/dL — AB (ref 65–99)
Glucose-Capillary: 167 mg/dL — ABNORMAL HIGH (ref 65–99)
Glucose-Capillary: 224 mg/dL — ABNORMAL HIGH (ref 65–99)

## 2015-12-13 NOTE — BHH Group Notes (Signed)
BHH Group Notes:  (Nursing/MHT/Case Management/Adjunct)  Date:  12/13/2015  Time:  3:27 PM  Type of Therapy:  Psychoeducational Skills  Participation Level:  Did Not Attend   Almira Bar 12/13/2015, 3:27 PM

## 2015-12-13 NOTE — Progress Notes (Signed)
[facility]  12/13/2015 5:10 PM James [name]  MRN:  [id number]  Subjective: I feel a little better & yet, my mood is mixed. I could not sleep well last night. Then, I was worried about losing my family. They would not pick-up the phone when I tried to call home".  Objective: James [name] is seen, chart reviewed. He is alert, oriented x 4. He is visible on the unit, attending group sessions. He currently denies any AVH, delusional thoughts or paranoia. He does not appear to be responding to any internal stimuli. He reported his mood as mixed. He remains on his treatment regimen, tolerating them without any adverse effects. Supportive approached & encouragement used during this assessment.  Principal Problem: MDD (major depressive disorder), recurrent severe, without psychosis (HCC)  Diagnosis:   Patient Active Problem List   Diagnosis Date Noted  . 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 Diabetes mellitus (HCC) [E11.9] 12/12/2015  . MDD (major depressive disorder), recurrent severe, without psychosis (HCC) [F33.2] 06/26/2015  . Cocaine use disorder, severe, dependence (HCC) [F14.20] 06/25/2015  . 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 MUSCLE SPASM, BACK [M53.80] 02/12/2009  . 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 HYPERCHOLESTEROLEMIA [E78.00] 05/09/2007  . SINUSITIS, CHRONIC [J32.9] 05/01/2007  . ALLERGIC RHINITIS [J30.9] 12/21/2006  . DIABETES MELLITUS, TYPE II [E11.9] 12/07/2006  . HYPERTENSION [I10] 12/07/2006   Total Time spent with patient: 25 minutes  Past Psychiatric History: Alcoholism, chronic.  Past Medical History:  Past Medical History:  Diagnosis Date  . Diabetes mellitus without complication [facility](HCC)     Past Surgical History:  Procedure Laterality Date  . FINGER SURGERY     Family History:  Family History  Problem Relation Age of Onset  . Diabetes Other   . Hypertension  Other   . Diabetes Mother   . Brain cancer Father   . Mental illness Brother   . Drug abuse Brother   . Diabetes Brother    Family Psychiatric  History: See H&P  Social History:  History  Alcohol Use  . Yes     History  Drug Use  . Frequency: 3.0 times per week  . Types: Cocaine, "Crack" cocaine    Social History   Social History  . Marital status: Legally Separated    Spouse name: N/A  . Number of children: N/A  . Years of education: N/A   Social History Main Topics  . Smoking status: Current Every Day Smoker    Packs/day: 0.50    Years: 23.00    Types: Cigarettes  . Smokeless tobacco: Never Used  . Alcohol use Yes  . Drug use:     Frequency: 3.0 times per week    Types: Cocaine, "Crack" cocaine  . Sexual activity: Not Currently   Other Topics Concern  . None   Social History Narrative  . None   Additional Social History:   Sleep: Fair  Appetite:  Fair  Current Medications: Current Facility-Administered Medications  Medication Dose Route Frequency Provider Last Rate Last Dose  . acetaminophen (TYLENOL) tablet 650 mg  650 mg Oral Q6H PRN Truman Haywardakia S Starkes, FNP      . alum & mag hydroxide-simeth (MAALOX/MYLANTA) 200-200-20 MG/5ML suspension 30 mL  30 mL Oral Q4H PRN Truman Haywardakia S Starkes, FNP      . buPROPion (WELLBUTRIN XL) 24 hr tablet 150 mg  150 mg Oral Daily Saramma Eappen, MD   150 mg at  12/13/15 1144  . hydrOXYzine (ATARAX/VISTARIL) tablet 25 mg  25 mg Oral TID PRN Truman Hayward, FNP   25 mg at 12/12/15 2248  . insulin aspart (novoLOG) injection 0-5 Units  0-5 Units Subcutaneous QHS Jomarie Longs, MD   3 Units at 12/12/15 2132  . insulin aspart (novoLOG) injection 0-9 Units  0-9 Units Subcutaneous TID WC Jomarie Longs, MD   3 Units at 12/13/15 1154  . insulin glargine (LANTUS) injection 10 Units  10 Units Subcutaneous QHS Jomarie Longs, MD   10 Units at 12/12/15 2132  . magnesium hydroxide (MILK OF MAGNESIA) suspension 30 mL  30 mL Oral Daily PRN Truman Hayward, FNP      . metFORMIN (GLUCOPHAGE) tablet 1,000 mg  1,000 mg Oral BID WC Jomarie Longs, MD   1,000 mg at 12/13/15 1145  . nicotine (NICODERM CQ - dosed in mg/24 hours) patch 21 mg  21 mg Transdermal Q0600 Truman Hayward, FNP      . traZODone (DESYREL) tablet 50 mg  50 mg Oral QHS,MR X 1 Truman Hayward, FNP   50 mg at 12/12/15 2248    Lab Results:  Results for orders placed or performed during the hospital encounter of 12/11/15 (from the past 48 hour(s))  Glucose, capillary     Status: Abnormal   Collection Time: 12/12/15  6:07 AM  Result Value Ref Range   Glucose-Capillary 189 (H) 65 - 99 mg/dL  Glucose, capillary     Status: Abnormal   Collection Time: 12/12/15 12:20 PM  Result Value Ref Range   Glucose-Capillary 264 (H) 65 - 99 mg/dL  Glucose, capillary     Status: Abnormal   Collection Time: 12/12/15  5:07 PM  Result Value Ref Range   Glucose-Capillary 169 (H) 65 - 99 mg/dL  Glucose, capillary     Status: Abnormal   Collection Time: 12/12/15  9:30 PM  Result Value Ref Range   Glucose-Capillary 262 (H) 65 - 99 mg/dL  Glucose, capillary     Status: Abnormal   Collection Time: 12/13/15  6:11 AM  Result Value Ref Range   Glucose-Capillary 167 (H) 65 - 99 mg/dL  TSH     Status: None   Collection Time: 12/13/15  6:24 AM  Result Value Ref Range   TSH 1.923 0.350 - 4.500 uIU/mL    Comment: Performed at [facility]  Glucose, capillary     Status: Abnormal   Collection Time: 12/13/15 11:51 AM  Result Value Ref Range   Glucose-Capillary 224 (H) 65 - 99 mg/dL    Blood Alcohol level:  Lab Results  Component Value Date   ETH <5 12/11/2015   ETH <5 12/04/2015    Metabolic Disorder Labs: Lab Results  Component Value Date   HGBA1C 14.3 (H) 05/29/2015   MPG 364 05/29/2015   MPG 272 (H) 12/07/2012   No results found for: PROLACTIN Lab Results  Component Value Date   CHOL 173 08/16/2008   TRIG 34 08/16/2008   HDL 76 08/16/2008   CHOLHDL 2.3  Ratio 08/16/2008   VLDL 7 08/16/2008   LDLCALC 90 08/16/2008   LDLCALC 98 12/20/2007   Physical Findings: AIMS: Facial and Oral Movements Muscles of Facial Expression: None, normal Lips and Perioral Area: None, normal Jaw: None, normal Tongue: None, normal,Extremity Movements Upper (arms, wrists, hands, fingers): None, normal Lower (legs, knees, ankles, toes): None, normal, Trunk Movements Neck, shoulders, hips: None, normal, Overall Severity Severity of abnormal movements (highest score  from questions above): None, normal Incapacitation due to abnormal movements: None, normal Patient's awareness of abnormal movements (rate only patient's report): No Awareness, Dental Status Current problems with teeth and/or dentures?: No Does patient usually wear dentures?: No  CIWA:  CIWA-Ar Total: 0 COWS:     Musculoskeletal: Strength & Muscle Tone: within normal limits Gait & Station: normal Patient leans: N/A  Psychiatric Specialty Exam: Physical Exam  ROS  Blood pressure 106/65, pulse 94, temperature 97.9 F (36.6 C), temperature source Oral, resp. rate 18, height 5' 10.5" (1.791 m), weight 61.7 kg (136 lb).Body mass index is 19.24 kg/m.  General Appearance: normal  Eye Contact::  Fair  Speech:  Clear and Coherent  Volume:  fluctuates  Mood:  Anxious, Depressed and Dysphoric  Affect:  Flat  Thought Process:  Coherent and Goal Directed at times circumstantial  Orientation:  Full (Time, Place, and Person)  Thought Content:  Rumination  Suicidal Thoughts:  Yes.  with intent/plan  Homicidal Thoughts:  No  Memory:  Immediate;   Fair Recent;   Fair Remote;   Fair  Judgement:  Fair  Insight:  Present and Shallow  Psychomotor Activity:  Restlessness  Concentration:  Fair  Recall:  [employer] of Knowledge:Fair  Language: Fair  Akathisia:  No  Handed:  Right  AIMS (if indicated):     Assets:  Desire for Improvement Housing Social Support  ADL's:  Intact  Cognition: WNL   Sleep:  Number of Hours: 6.75     Assessment: Patient is a 55 year old African American male, married , lives in [place] with his wife and daughter , is unemployed , has a hx of depression , cocaine and alcohol abuse, presented to [facility] with SI with plan to use a firearm on himself.  Treatment Plan Summary: Daily contact with patient to assess and evaluate symptoms and progress in treatment and Medication management: Depression: Continue Wellbutrin XL 150 mg daily. Anxiety: Continue Hydroxyzine 25 mr prn qid. Insomnia: Continue Trazodone 50 mg prn, may repeat q hs. Other medical issues: Continue pertinent home medications as recommended. - Continue 15 minutes observation for safety concerns - Encouraged to participate in milieu therapy and group therapy counseling sessions and also work with coping skills -  Develop treatment plan to decrease risk of relapse upon discharge and to reduce the need for readmission. -  Psycho-social education regarding relapse prevention and self care. - Health care follow up as needed for medical problems. - Restart home medications where appropriate; See Active orders.  Armandina Stammer I, NP, PMHNP-BC 12/13/2015, 5:10 PM

## 2015-12-13 NOTE — Progress Notes (Signed)
Patient did attend the evening speaker AA meeting.  

## 2015-12-13 NOTE — [facility] Group Notes (Signed)
[facility] LCSW Group Therapy  12/13/2015 10:00 AM  Type of Therapy:  Group Therapy  Participation Level:  Did Not Attend   [place] Sessions 12/13/2015,

## 2015-12-14 LAB — GLUCOSE, CAPILLARY
GLUCOSE-CAPILLARY: 252 mg/dL — AB (ref 65–99)
GLUCOSE-CAPILLARY: 352 mg/dL — AB (ref 65–99)
GLUCOSE-CAPILLARY: 271 mg/dL — AB (ref 65–99)
Glucose-Capillary: 237 mg/dL — ABNORMAL HIGH (ref 65–99)

## 2015-12-14 MED ORDER — VITAMIN B-12 100 MCG PO TABS
100.0000 ug | ORAL_TABLET | Freq: Every day | ORAL | Status: DC
Start: 2015-12-14 — End: 2015-12-16
  Administered 2015-12-15 – 2015-12-16 (×2): 100 ug via ORAL
  Filled 2015-12-14 (×8): qty 1

## 2015-12-14 NOTE — Progress Notes (Signed)
D: Patient's self inventory sheet: patient has fair sleep, did not recieve sleep medication.good  Appetite, low energy level, good concentration. Rated depression 8/10, hopeless 8/10, anxiety 7/10. SI/HI/AVH: denies. Physical complaints are diarrhea. Goal is "staying clean". Plans to work on "think positive".  Patient more present in milieu but minimally interactive. A: Medications administered, assessed medication knowledge and education given on medication regimen.  Emotional support and encouragement given patient. R: denies SI and HI , contracts for safety. Safety maintained with 15 minute checks.

## 2015-12-14 NOTE — [facility] Group Notes (Signed)
Nursing psychoeducational group focussed on mindfulness meditation/body scanning technique with guided meditation.   Pt did not attend.

## 2015-12-14 NOTE — Plan of Care (Signed)
Problem: Activity: Goal: Interest or engagement in activities will improve Outcome: Progressing Pt was positive for evening AA group

## 2015-12-14 NOTE — Progress Notes (Addendum)
Patient ID: James GoingJohnnie L [name], male   DOB: 1960/12/07, 55 y.o.   MRN: [id number] [facility]  12/14/2015 3:10 PM James GoingJohnnie L [name]  MRN:  [id number]  Subjective: James [name] reports: "I feel frustrated that none of my family member wants to talk to me. I feel like giving up. My mind is almost saying, don't go to no rehab because it does not matter no way for no body cares. I feel disappointed & agitated right now. I still have the withdrawal symptoms [name] on, that did not help anything".  Objective: James [name] is seen, chart reviewed. He is alert, oriented x 4. He is visible on the unit, attending group sessions. He currently denies any AVH, delusional thoughts or paranoia. He does not appear to be responding to any internal stimuli. He reported his mood as mixed. He remains on his treatment regimen, tolerating them without any adverse effects. Supportive approached & encouragement used during this assessment. He says he will stick to his plan & go a rehab center even though a voice in his head is telling him to not go"  Principal Problem: MDD (major depressive disorder), recurrent severe, without psychosis (HCC)  Diagnosis:   Patient Active Problem List   Diagnosis Date Noted  . 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 Diabetes mellitus (HCC) [E11.9] 12/12/2015  . MDD (major depressive disorder), recurrent severe, without psychosis (HCC) [F33.2] 06/26/2015  . Cocaine use disorder, severe, dependence (HCC) [F14.20] 06/25/2015  . 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 MUSCLE SPASM, BACK [M53.80] 02/12/2009  . 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 HYPERCHOLESTEROLEMIA [E78.00] 05/09/2007  . SINUSITIS, CHRONIC [J32.9] 05/01/2007  . ALLERGIC RHINITIS [J30.9] 12/21/2006  . DIABETES MELLITUS, TYPE II [E11.9] 12/07/2006  . HYPERTENSION [I10] 12/07/2006   Total Time spent with patient: 25 minutes  Past Psychiatric  History: Alcoholism, chronic.  Past Medical History:  Past Medical History:  Diagnosis Date  . Diabetes mellitus without complication [facility](HCC)     Past Surgical History:  Procedure Laterality Date  . FINGER SURGERY     Family History:  Family History  Problem Relation Age of Onset  . Diabetes Other   . Hypertension Other   . Diabetes Mother   . Brain cancer Father   . Mental illness Brother   . Drug abuse Brother   . Diabetes Brother    Family Psychiatric  History: See H&P  Social History:  History  Alcohol Use  . Yes     History  Drug Use  . Frequency: 3.0 times per week  . Types: Cocaine, "Crack" cocaine    Social History   Social History  . Marital status: Legally Separated    Spouse name: N/A  . Number of children: N/A  . Years of education: N/A   Social History Main Topics  . Smoking status: Current Every Day Smoker    Packs/day: 0.50    Years: 23.00    Types: Cigarettes  . Smokeless tobacco: Never Used  . Alcohol use Yes  . Drug use:     Frequency: 3.0 times per week    Types: Cocaine, "Crack" cocaine  . Sexual activity: Not Currently   Other Topics Concern  . None   Social History Narrative  . None   Additional Social History:   Sleep: Fair  Appetite:  Fair  Current Medications: Current Facility-Administered Medications  Medication Dose Route Frequency Provider Last Rate Last Dose  . acetaminophen (TYLENOL)  tablet 650 mg  650 mg Oral Q6H PRN Truman Hayward, FNP      . alum & mag hydroxide-simeth (MAALOX/MYLANTA) 200-200-20 MG/5ML suspension 30 mL  30 mL Oral Q4H PRN Truman Hayward, FNP      . buPROPion (WELLBUTRIN XL) 24 hr tablet 150 mg  150 mg Oral Daily Saramma Eappen, MD   150 mg at 12/14/15 0941  . hydrOXYzine (ATARAX/VISTARIL) tablet 25 mg  25 mg Oral TID PRN Truman Hayward, FNP   25 mg at 12/14/15 0006  . insulin aspart (novoLOG) injection 0-5 Units  0-5 Units Subcutaneous QHS Jomarie Longs, MD   2 Units at 12/13/15 2136  .  insulin aspart (novoLOG) injection 0-9 Units  0-9 Units Subcutaneous TID WC Jomarie Longs, MD   2 Units at 12/14/15 1152  . insulin glargine (LANTUS) injection 10 Units  10 Units Subcutaneous QHS Jomarie Longs, MD   10 Units at 12/13/15 2136  . magnesium hydroxide (MILK OF MAGNESIA) suspension 30 mL  30 mL Oral Daily PRN Truman Hayward, FNP      . metFORMIN (GLUCOPHAGE) tablet 1,000 mg  1,000 mg Oral BID WC Jomarie Longs, MD   1,000 mg at 12/14/15 0942  . traZODone (DESYREL) tablet 50 mg  50 mg Oral QHS,MR X 1 Truman Hayward, FNP   50 mg at 12/14/15 0006    Lab Results:  Results for orders placed or performed during the hospital encounter of 12/11/15 (from the past 48 hour(s))  Glucose, capillary     Status: Abnormal   Collection Time: 12/12/15  5:07 PM  Result Value Ref Range   Glucose-Capillary 169 (H) 65 - 99 mg/dL  Glucose, capillary     Status: Abnormal   Collection Time: 12/12/15  9:30 PM  Result Value Ref Range   Glucose-Capillary 262 (H) 65 - 99 mg/dL  Glucose, capillary     Status: Abnormal   Collection Time: 12/13/15  6:11 AM  Result Value Ref Range   Glucose-Capillary 167 (H) 65 - 99 mg/dL  TSH     Status: None   Collection Time: 12/13/15  6:24 AM  Result Value Ref Range   TSH 1.923 0.350 - 4.500 uIU/mL    Comment: Performed at [facility]  Glucose, capillary     Status: Abnormal   Collection Time: 12/13/15 11:51 AM  Result Value Ref Range   Glucose-Capillary 224 (H) 65 - 99 mg/dL  Glucose, capillary     Status: Abnormal   Collection Time: 12/13/15  5:20 PM  Result Value Ref Range   Glucose-Capillary 350 (H) 65 - 99 mg/dL  Glucose, capillary     Status: Abnormal   Collection Time: 12/13/15  9:13 PM  Result Value Ref Range   Glucose-Capillary 209 (H) 65 - 99 mg/dL   Comment 1 Notify RN    Comment 2 Document in Chart   Glucose, capillary     Status: Abnormal   Collection Time: 12/14/15  5:42 AM  Result Value Ref Range   Glucose-Capillary 252  (H) 65 - 99 mg/dL   Comment 1 Notify RN    Comment 2 Document in Chart   Glucose, capillary     Status: Abnormal   Collection Time: 12/14/15 11:39 AM  Result Value Ref Range   Glucose-Capillary 237 (H) 65 - 99 mg/dL    Blood Alcohol level:  Lab Results  Component Value Date   ETH <5 12/11/2015   ETH <5 12/04/2015  Metabolic Disorder Labs: Lab Results  Component Value Date   HGBA1C 14.3 (H) 05/29/2015   MPG 364 05/29/2015   MPG 272 (H) 12/07/2012   No results found for: PROLACTIN Lab Results  Component Value Date   CHOL 173 08/16/2008   TRIG 34 08/16/2008   HDL 76 08/16/2008   CHOLHDL 2.3 Ratio 08/16/2008   VLDL 7 08/16/2008   LDLCALC 90 08/16/2008   LDLCALC 98 12/20/2007   Physical Findings: AIMS: Facial and Oral Movements Muscles of Facial Expression: None, normal Lips and Perioral Area: None, normal Jaw: None, normal Tongue: None, normal,Extremity Movements Upper (arms, wrists, hands, fingers): None, normal Lower (legs, knees, ankles, toes): None, normal, Trunk Movements Neck, shoulders, hips: None, normal, Overall Severity Severity of abnormal movements (highest score from questions above): None, normal Incapacitation due to abnormal movements: None, normal Patient's awareness of abnormal movements (rate only patient's report): No Awareness, Dental Status Current problems with teeth and/or dentures?: No Does patient usually wear dentures?: No  CIWA:  CIWA-Ar Total: 0 COWS:     Musculoskeletal: Strength & Muscle Tone: within normal limits Gait & Station: normal Patient leans: N/A  Psychiatric Specialty Exam: Physical Exam  Constitutional: He appears well-developed and well-nourished.  HENT:  Head: Normocephalic.  Eyes: Pupils are equal, round, and reactive to light.  Neck: Normal range of motion.  Cardiovascular: Normal rate.   Respiratory: Effort normal.  GI: Soft.    Review of Systems  Constitutional: Positive for malaise/fatigue.  Eyes:  Negative.   Respiratory: Negative.   Cardiovascular: Negative.   Gastrointestinal: Negative.   Genitourinary: Negative.   Musculoskeletal: Negative.   Skin: Negative.   Neurological: Positive for tremors and weakness.  Endo/Heme/Allergies: Negative.   Psychiatric/Behavioral: Positive for depression and substance abuse (Hx. Alcoholism.). Negative for hallucinations. The patient is nervous/anxious and has insomnia (Worsening).     Blood pressure 130/73, pulse 86, temperature 97.8 F (36.6 C), temperature source Oral, resp. rate 16, height 5' 10.5" (1.791 m), weight 61.7 kg (136 lb).Body mass index is 19.24 kg/m.  General Appearance: normal  Eye Contact::  Fair  Speech:  Clear and Coherent  Volume:  fluctuates  Mood:  Anxious, Depressed and Dysphoric  Affect:  Flat  Thought Process:  Coherent and Goal Directed at times circumstantial  Orientation:  Full (Time, Place, and Person)  Thought Content:  Rumination  Suicidal Thoughts:  Yes.  with intent/plan  Homicidal Thoughts:  No  Memory:  Immediate;   Fair Recent;   Fair Remote;   Fair  Judgement:  Fair  Insight:  Present and Shallow  Psychomotor Activity:  Restlessness  Concentration:  Fair  Recall:  [employer] of Knowledge:Fair  Language: Fair  Akathisia:  No  Handed:  Right  AIMS (if indicated):     Assets:  Desire for Improvement Housing Social Support  ADL's:  Intact  Cognition: WNL  Sleep:  Number of Hours: 6.75     Assessment: Patient is a 55 year old African American male, married , lives in [place] with his wife and daughter , is unemployed , has a hx of depression , cocaine and alcohol abuse, presented to [facility] with SI with plan to use a firearm on himself.  Treatment Plan Summary: Daily contact with patient to assess and evaluate symptoms and progress in treatment and Medication management: Depression: Continue Wellbutrin XL 150 mg daily. Anxiety: Continue Hydroxyzine 25 mr prn qid. Insomnia: Continue Trazodone 50  mg prn, may repeat q hs. Other medical issues: Continue pertinent  home medications as recommended. - Continue 15 minutes observation for safety concerns - Encouraged to participate in milieu therapy and group therapy counseling sessions and also work with coping skills -  Develop treatment plan to decrease risk of relapse upon discharge and to reduce the need for readmission. -  Psycho-social education regarding relapse prevention and self care. - Health care follow up as needed for medical problems. - Restart home medications where appropriate; See Active orders.  Armandina Stammer I, NP, PMHNP-BC 12/14/2015, 3:10 PM

## 2015-12-14 NOTE — Progress Notes (Signed)
D.  Pt pleasant on approach, no complaints voiced at this time.  Positive for evening AA group, interacting appropriately with peers on the unit.  Pt denies SI/HI/hallucinations at this time.  A.  Support and encouragement offered, medications given as ordered  R.  PT remains safe on the unit, will continue to monitor.

## 2015-12-15 LAB — GLUCOSE, CAPILLARY
GLUCOSE-CAPILLARY: 229 mg/dL — AB (ref 65–99)
Glucose-Capillary: 274 mg/dL — ABNORMAL HIGH (ref 65–99)
Glucose-Capillary: 266 mg/dL — ABNORMAL HIGH (ref 65–99)
Glucose-Capillary: 260 mg/dL — ABNORMAL HIGH (ref 65–99)

## 2015-12-15 MED ORDER — INSULIN GLARGINE 100 UNIT/ML ~~LOC~~ SOLN
12.0000 [IU] | Freq: Every day | SUBCUTANEOUS | Status: DC
  Administered 2015-12-15: 12 [IU] via SUBCUTANEOUS
  Filled 2015-12-15: qty 0.12

## 2015-12-15 MED ORDER — INSULIN ASPART 100 UNIT/ML ~~LOC~~ SOLN
0.0000 [IU] | Freq: Three times a day (TID) | SUBCUTANEOUS | Status: DC
  Administered 2015-12-16: 2 [IU] via SUBCUTANEOUS
  Administered 2015-12-16: 1 [IU] via SUBCUTANEOUS

## 2015-12-15 MED ORDER — INSULIN ASPART 100 UNIT/ML ~~LOC~~ SOLN
4.0000 [IU] | Freq: Three times a day (TID) | SUBCUTANEOUS | Status: DC
  Administered 2015-12-16 (×2): 4 [IU] via SUBCUTANEOUS

## 2015-12-15 MED ORDER — INSULIN ASPART 100 UNIT/ML ~~LOC~~ SOLN
0.0000 [IU] | Freq: Every day | SUBCUTANEOUS | Status: DC
  Administered 2015-12-15: 3 [IU] via SUBCUTANEOUS

## 2015-12-15 MED ORDER — INSULIN ASPART 100 UNIT/ML ~~LOC~~ SOLN: 0.0000 [IU] | Freq: Every day | SUBCUTANEOUS | Status: DC

## 2015-12-15 NOTE — Progress Notes (Signed)
D   Pt reports he is very depressed and sad and still has trouble with anxiety     He interacts appropriately but minimally with others  He is compliant with medications and treatment  A    Verbal support given   Medications administered and effectiveness monitored   Q15 min checks R     Pt safe at present and receptive to verbal support

## 2015-12-15 NOTE — Progress Notes (Signed)
Patient did attend the evening speaker AA meeting.  

## 2015-12-15 NOTE — Progress Notes (Signed)
Recreation Therapy Notes  Date: 12/15/15 Time: 0930 Location: 300 [address] Group Room  Group Topic: Stress Management  Goal Area(s) Addresses:  Patient will verbalize importance of using healthy stress management.  Patient will identify positive emotions associated with healthy stress management.   Intervention: Stress Management  Activity :  Guided Imagery.  LRT introduced the stress management technique of guided imagery to the patients.  LRT read a script so patients could participate in the activity.  Patients were to follow along as LRT read script to participate.  Education:  Stress Management, Discharge Planning.   Education Outcome: Needs additional education  Clinical Observations/Feedback: Pt did not attend group.   Caroll Rancher, LRT/CTRS         Caroll Rancher A 12/15/2015 12:25 PM

## 2015-12-15 NOTE — Progress Notes (Signed)
Patient ID: James [name], male   DOB: [birth year], 55 y.o.   MRN: [id number] D: Patient became upset when he was told he was not being discharged today.  He stated, "I need to leave today.  I need to see my granddaughter."  Patient was told he is a possible discharge for tomorrow.  He denies any thoughts of self harm.  He denies HI/AVH.  He rates his depression, hopelessness and anxiety as a 7.  He is sleeping well; appetite is good.  His goal today is to "stay focused on my recovery."  He reports minimal withdrawal symptoms.   A: Continue to monitor medication management and MD orders.  Safety checks completed every 15 minutes per protocol.  Offer support and encouragement as needed. R: Patient is receptive to staff; he is cooperative and calm at this time.

## 2015-12-15 NOTE — Plan of Care (Signed)
Problem: Education: Goal: Verbalization of understanding the information provided will improve Outcome: Progressing Patient was requesting to discharge today.  He was agreeable with staying until tomorrow.

## 2015-12-15 NOTE — Progress Notes (Signed)
Patient ID: James [name], male   DOB: 1960/09/13, 55 y.o.   MRN: [id number] Patient ID: James [name], male   DOB: [birth year], 55 y.o.   MRN: [id number] [facility] MD Progress Note  12/15/2015 2:27 PM James [name]  MRN:  [id number]  Subjective: Faith reports: "I'm hanging in there. My daughter will be bringing my grand-babies to see me today. That may even lighten my mood"  Objective: James [name] is seen, chart reviewed. He is alert, oriented x 4. He is visible on the unit, attending group sessions. He currently denies any AVH, delusional thoughts or paranoia. He does not appear to be responding to any internal stimuli. He reported his mood as mixed. He remains on his treatment regimen, tolerating them without any adverse effects. Supportive approached & encouragement used during this assessment. He says he will stick to his plan & go a rehab center even though a voice in his head is telling him to not go"  Principal Problem: MDD (major depressive disorder), recurrent severe, without psychosis (HCC)  Diagnosis:   Patient Active Problem List   Diagnosis Date Noted  . 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 Diabetes mellitus (HCC) [E11.9] 12/12/2015  . MDD (major depressive disorder), recurrent severe, without psychosis (HCC) [F33.2] 06/26/2015  . Cocaine use disorder, severe, dependence (HCC) [F14.20] 06/25/2015  . 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 MUSCLE SPASM, BACK [M53.80] 02/12/2009  . 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 HYPERCHOLESTEROLEMIA [E78.00] 05/09/2007  . SINUSITIS, CHRONIC [J32.9] 05/01/2007  . ALLERGIC RHINITIS [J30.9] 12/21/2006  . DIABETES MELLITUS, TYPE II [E11.9] 12/07/2006  . HYPERTENSION [I10] 12/07/2006   Total Time spent with patient: 15 minutes  Past Psychiatric History: Alcoholism, chronic.  Past Medical History:  Past Medical History:  Diagnosis Date  . Diabetes mellitus  without complication [facility])     Past Surgical History:  Procedure Laterality Date  . FINGER SURGERY     Family History:  Family History  Problem Relation Age of Onset  . Diabetes Other   . Hypertension Other   . Diabetes Mother   . Brain cancer Father   . Mental illness Brother   . Drug abuse Brother   . Diabetes Brother    Family Psychiatric  History: See H&P  Social History:  History  Alcohol Use  . Yes     History  Drug Use  . Frequency: 3.0 times per week  . Types: Cocaine, "Crack" cocaine    Social History   Social History  . Marital status: Legally Separated    Spouse name: N/A  . Number of children: N/A  . Years of education: N/A   Social History Main Topics  . Smoking status: Current Every Day Smoker    Packs/day: 0.50    Years: 23.00    Types: Cigarettes  . Smokeless tobacco: Never Used  . Alcohol use Yes  . Drug use:     Frequency: 3.0 times per week    Types: Cocaine, "Crack" cocaine  . Sexual activity: Not Currently   Other Topics Concern  . None   Social History Narrative  . None   Additional Social History:   Sleep: Fair  Appetite:  Fair  Current Medications: Current Facility-Administered Medications  Medication Dose Route Frequency Provider Last Rate Last Dose  . acetaminophen (TYLENOL) tablet 650 mg  650 mg Oral Q6H PRN Truman Hayward, FNP      . alum & mag hydroxide-simeth (  MAALOX/MYLANTA) 200-200-20 MG/5ML suspension 30 mL  30 mL Oral Q4H PRN Truman Hayward, FNP      . buPROPion (WELLBUTRIN XL) 24 hr tablet 150 mg  150 mg Oral Daily Saramma Eappen, MD   150 mg at 12/15/15 0827  . hydrOXYzine (ATARAX/VISTARIL) tablet 25 mg  25 mg Oral TID PRN Truman Hayward, FNP   25 mg at 12/14/15 2130  . insulin aspart (novoLOG) injection 0-5 Units  0-5 Units Subcutaneous QHS Jomarie Longs, MD   3 Units at 12/14/15 2126  . insulin aspart (novoLOG) injection 0-9 Units  0-9 Units Subcutaneous TID WC Jomarie Longs, MD   5 Units at 12/15/15 1147   . insulin glargine (LANTUS) injection 10 Units  10 Units Subcutaneous QHS Jomarie Longs, MD   10 Units at 12/14/15 2125  . magnesium hydroxide (MILK OF MAGNESIA) suspension 30 mL  30 mL Oral Daily PRN Truman Hayward, FNP      . metFORMIN (GLUCOPHAGE) tablet 1,000 mg  1,000 mg Oral BID WC Jomarie Longs, MD   1,000 mg at 12/15/15 0827  . traZODone (DESYREL) tablet 50 mg  50 mg Oral QHS,MR X 1 Truman Hayward, FNP   50 mg at 12/14/15 2130  . vitamin B-12 (CYANOCOBALAMIN) tablet 100 mcg  100 mcg Oral Daily Sanjuana Kava, NP   100 mcg at 12/15/15 0827   Lab Results:  Results for orders placed or performed during the hospital encounter of 12/11/15 (from the past 48 hour(s))  Glucose, capillary     Status: Abnormal   Collection Time: 12/13/15  5:20 PM  Result Value Ref Range   Glucose-Capillary 350 (H) 65 - 99 mg/dL  Glucose, capillary     Status: Abnormal   Collection Time: 12/13/15  9:13 PM  Result Value Ref Range   Glucose-Capillary 209 (H) 65 - 99 mg/dL   Comment 1 Notify RN    Comment 2 Document in Chart   Glucose, capillary     Status: Abnormal   Collection Time: 12/14/15  5:42 AM  Result Value Ref Range   Glucose-Capillary 252 (H) 65 - 99 mg/dL   Comment 1 Notify RN    Comment 2 Document in Chart   Glucose, capillary     Status: Abnormal   Collection Time: 12/14/15 11:39 AM  Result Value Ref Range   Glucose-Capillary 237 (H) 65 - 99 mg/dL  Glucose, capillary     Status: Abnormal   Collection Time: 12/14/15  4:54 PM  Result Value Ref Range   Glucose-Capillary 352 (H) 65 - 99 mg/dL  Glucose, capillary     Status: Abnormal   Collection Time: 12/14/15  9:09 PM  Result Value Ref Range   Glucose-Capillary 271 (H) 65 - 99 mg/dL   Comment 1 Notify RN    Comment 2 Document in Chart   Glucose, capillary     Status: Abnormal   Collection Time: 12/15/15  6:20 AM  Result Value Ref Range   Glucose-Capillary 229 (H) 65 - 99 mg/dL  Glucose, capillary     Status: Abnormal   Collection  Time: 12/15/15 11:41 AM  Result Value Ref Range   Glucose-Capillary 274 (H) 65 - 99 mg/dL   Blood Alcohol level:  Lab Results  Component Value Date   ETH <5 12/11/2015   ETH <5 12/04/2015   Metabolic Disorder Labs: Lab Results  Component Value Date   HGBA1C 14.3 (H) 05/29/2015   MPG 364 05/29/2015   MPG 272 (H) 12/07/2012  No results found for: PROLACTIN Lab Results  Component Value Date   CHOL 173 08/16/2008   TRIG 34 08/16/2008   HDL 76 08/16/2008   CHOLHDL 2.3 Ratio 08/16/2008   VLDL 7 08/16/2008   LDLCALC 90 08/16/2008   LDLCALC 98 12/20/2007   Physical Findings: AIMS: Facial and Oral Movements Muscles of Facial Expression: None, normal Lips and Perioral Area: None, normal Jaw: None, normal Tongue: None, normal,Extremity Movements Upper (arms, wrists, hands, fingers): None, normal Lower (legs, knees, ankles, toes): None, normal, Trunk Movements Neck, shoulders, hips: None, normal, Overall Severity Severity of abnormal movements (highest score from questions above): None, normal Incapacitation due to abnormal movements: None, normal Patient's awareness of abnormal movements (rate only patient's report): No Awareness, Dental Status Current problems with teeth and/or dentures?: No Does patient usually wear dentures?: No  CIWA:  CIWA-Ar Total: 0 COWS:     Musculoskeletal: Strength & Muscle Tone: within normal limits Gait & Station: normal Patient leans: N/A  Psychiatric Specialty Exam: Physical Exam  Constitutional: He appears well-developed and well-nourished.  HENT:  Head: Normocephalic.  Eyes: Pupils are equal, round, and reactive to light.  Neck: Normal range of motion.  Cardiovascular: Normal rate.   Respiratory: Effort normal.  GI: Soft.    Review of Systems  Constitutional: Positive for malaise/fatigue.  Eyes: Negative.   Respiratory: Negative.   Cardiovascular: Negative.   Gastrointestinal: Negative.   Genitourinary: Negative.    Musculoskeletal: Negative.   Skin: Negative.   Neurological: Positive for tremors and weakness.  Endo/Heme/Allergies: Negative.   Psychiatric/Behavioral: Positive for depression and substance abuse (Hx. Alcoholism.). Negative for hallucinations. The patient is nervous/anxious and has insomnia (Worsening).     Blood pressure 128/68, pulse 86, temperature 97.8 F (36.6 C), temperature source Oral, resp. rate 16, height 5' 10.5" (1.791 m), weight 61.7 kg (136 lb).Body mass index is 19.24 kg/m.  General Appearance: normal  Eye Contact::  Fair  Speech:  Clear and Coherent  Volume:  fluctuates  Mood:  Anxious, Depressed and Dysphoric  Affect:  Flat  Thought Process:  Coherent and Goal Directed at times circumstantial  Orientation:  Full (Time, Place, and Person)  Thought Content:  Rumination  Suicidal Thoughts:  Yes.  with intent/plan  Homicidal Thoughts:  No  Memory:  Immediate;   Fair Recent;   Fair Remote;   Fair  Judgement:  Fair  Insight:  Present and Shallow  Psychomotor Activity:  Restlessness  Concentration:  Fair  Recall:  [employer] of Knowledge:Fair  Language: Fair  Akathisia:  No  Handed:  Right  AIMS (if indicated):     Assets:  Desire for Improvement Housing Social Support  ADL's:  Intact  Cognition: WNL  Sleep:  Number of Hours: 6.75     Assessment: Patient is a 54 year old African American male, married , lives in [place] with his wife and daughter , is unemployed , has a hx of depression , cocaine and alcohol abuse, presented to [facility] with SI with plan to use a firearm on himself.  Treatment Plan Summary: Daily contact with patient to assess and evaluate symptoms and progress in treatment and Medication management: Depression: Continue Wellbutrin XL 150 mg daily. Anxiety: Continue Hydroxyzine 25 mr prn qid. Insomnia: Continue Trazodone 50 mg prn, may repeat q hs. Other medical issues: Continue pertinent home medications as recommended. - Continue 15 minutes  observation for safety concerns - Encouraged to participate in milieu therapy and group therapy counseling sessions and also work with  coping skills -  Develop treatment plan to decrease risk of relapse upon discharge and to reduce the need for readmission. -  Psycho-social education regarding relapse prevention and self care. - Health care follow up as needed for medical problems. - Restart home medications where appropriate; See Active orders.  Armandina StammerNwoko, Agnes I, NP, PMHNP-BC 12/15/2015, 2:27 PM

## 2015-12-15 NOTE — Progress Notes (Signed)
Inpatient Diabetes Program Recommendations  AACE/ADA: New Consensus Statement on Inpatient Glycemic Control (2015)  Target Ranges:  Prepandial:   less than 140 mg/dL      Peak postprandial:   less than 180 mg/dL (1-2 hours)      Critically ill patients:  140 - 180 mg/dL   Lab Results  Component Value Date   GLUCAP 266 (H) 12/15/2015   HGBA1C 14.3 (H) 05/29/2015    Review of Glycemic Control  Results for James [name], James [name] (MRN [id number]) as of 12/15/2015 16:57  Ref. Range 12/14/2015 16:54 12/14/2015 21:09 12/15/2015 06:20 12/15/2015 11:41 12/15/2015 16:56  Glucose-Capillary Latest Ref Range: 65 - 99 mg/dL 680 (H) 881 (H) 103 (H) 274 (H) 266 (H)   Pt is not taking insulin at home. Spoke with RN.  Inpatient Diabetes Program Recommendations:    Need updated HgbA1C - last one 05/29/2015 - 14.3%. Increase Lantus to 12 units QHS Add Novolog 4 units tidwc for meal coverage insulin. Case manager consult for assistance with meds at discharge.  Will follow. Thank you. Ailene Ards, RD, LDN, CDE Inpatient Diabetes Coordinator [phone]

## 2015-12-16 LAB — GLUCOSE, CAPILLARY
GLUCOSE-CAPILLARY: 163 mg/dL — AB (ref 65–99)
GLUCOSE-CAPILLARY: 150 mg/dL — AB (ref 65–99)

## 2015-12-16 LAB — HEMOGLOBIN A1C: Mean Plasma Glucose: >398 mg/dL

## 2015-12-16 MED ORDER — BUPROPION HCL ER (XL) 150 MG PO TB24: 150.0000 mg | ORAL_TABLET | Freq: Every day | ORAL | 0 refills | Status: DC

## 2015-12-16 MED ORDER — METFORMIN HCL 1000 MG PO TABS: 1000.0000 mg | ORAL_TABLET | Freq: Two times a day (BID) | ORAL | 0 refills | Status: DC

## 2015-12-16 MED ORDER — HYDROXYZINE HCL 25 MG PO TABS: 25.0000 mg | ORAL_TABLET | Freq: Three times a day (TID) | ORAL | 0 refills | Status: DC | PRN

## 2015-12-16 MED ORDER — TRAZODONE HCL 50 MG PO TABS: 50.0000 mg | ORAL_TABLET | Freq: Every evening | ORAL | 0 refills | Status: DC | PRN

## 2015-12-16 MED ORDER — INSULIN GLARGINE 100 UNIT/ML ~~LOC~~ SOLN: 12.0000 [IU] | Freq: Every day | SUBCUTANEOUS | 0 refills | Status: DC

## 2015-12-16 NOTE — Discharge Summary (Signed)
Physician Discharge Summary Note  Patient:  James GoingJohnnie L Billick is an 55 y.o., male MRN:  [id number] DOB:  [birth year] Patient phone:  [phone] (home)  [address] address[address]   [address] [place] [id number],  Total Time spent with patient: 30 minutes  Date of Admission:  12/11/2015 Date of Discharge: 12/16/2015  Reason for Admission:  Worsening depression  Principal Problem: MDD (major depressive disorder), recurrent severe, without psychosis [facility](HCC) Discharge Diagnoses: Patient Active Problem List   Diagnosis Date Noted  . 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 Diabetes mellitus (HCC) [E11.9] 12/12/2015  . MDD (major depressive disorder), recurrent severe, without psychosis (HCC) [F33.2] 06/26/2015  . Cocaine use disorder, severe, dependence (HCC) [F14.20] 06/25/2015  . 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 MUSCLE SPASM, BACK [M53.80] 02/12/2009  . 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 HYPERCHOLESTEROLEMIA [E78.00] 05/09/2007  . SINUSITIS, CHRONIC [J32.9] 05/01/2007  . ALLERGIC RHINITIS [J30.9] 12/21/2006  . DIABETES MELLITUS, TYPE II [E11.9] 12/07/2006  . HYPERTENSION [I10] 12/07/2006    Past Psychiatric History:  See HPI  Past Medical History:  Past Medical History:  Diagnosis Date  . Diabetes mellitus without complication [facility](HCC)     Past Surgical History:  Procedure Laterality Date  . FINGER SURGERY     Family History:  Family History  Problem Relation Age of Onset  . Diabetes Other   . Hypertension Other   . Diabetes Mother   . Brain cancer Father   . Mental illness Brother   . Drug abuse Brother   . Diabetes Brother    Family Psychiatric  History:  See HPI Social History:  History  Alcohol Use  . Yes     History  Drug Use  . Frequency: 3.0 times per week  . Types: Cocaine, "Crack" cocaine    Social History   Social History  . Marital status: Legally Separated     Spouse name: N/A  . Number of children: N/A  . Years of education: N/A   Social History Main Topics  . Smoking status: Current Every Day Smoker    Packs/day: 0.50    Years: 23.00    Types: Cigarettes  . Smokeless tobacco: Never Used  . Alcohol use Yes  . Drug use:     Frequency: 3.0 times per week    Types: Cocaine, "Crack" cocaine  . Sexual activity: Not Currently   Other Topics Concern  . None   Social History Narrative  . None    Hospital Course:   James GoingJohnnie L Vandyne was admitted for MDD (major depressive disorder), recurrent severe, without psychosis (HCC) and crisis management.  He was treated with medications listed below.  Medical problems were identified and treated as needed.  Home medications were restarted as appropriate.  Improvement was monitored by observation and James GoingJohnnie L Blaszczyk daily report of symptom reduction.  Emotional and mental status was monitored by daily self inventory reports completed by James GoingJohnnie L Irion and clinical staff.  Patient reported continued improvement, denied any new concerns.  Patient had been compliant on medications and denied side effects.  Support and encouragement was provided.    At time of discharge, patient rated both depression and anxiety levels to be manageable and minimal.  Patient encouraged to attend groups to help with recognizing triggers of emotional crises and de-stabilizations.  Patient encouraged to attend group to help identify the positive things in life that would help in dealing with feelings of loss,  depression and unhealthy or abusive tendencies.         James [name] was evaluated by the treatment team for stability and plans for continued recovery upon discharge.  He was offered further treatment options upon discharge including Residential, Intensive Outpatient and Outpatient treatment.  He will follow up with agencies listed below for medication management and counseling.  Encouraged patient to maintain  satisfactory support network and home environment.  Advised to adhere to medication compliance and outpatient treatment follow up.  Prescriptions provided.       Aleksey Alvina Filbert motivation was an integral factor for scheduling further treatment.  Employment, transportation, bed availability, health status, family support, and any pending legal issues were also considered during his hospital stay.  Upon completion of this admission the patient was both mentally and medically stable for discharge denying suicidal/homicidal ideation, auditory/visual/tactile hallucinations, delusional thoughts and paranoia.      Physical Findings: AIMS: Facial and Oral Movements Muscles of Facial Expression: None, normal Lips and Perioral Area: None, normal Jaw: None, normal Tongue: None, normal,Extremity Movements Upper (arms, wrists, hands, fingers): None, normal Lower (legs, knees, ankles, toes): None, normal, Trunk Movements Neck, shoulders, hips: None, normal, Overall Severity Severity of abnormal movements (highest score from questions above): None, normal Incapacitation due to abnormal movements: None, normal Patient's awareness of abnormal movements (rate only patient's report): No Awareness, Dental Status Current problems with teeth and/or dentures?: No Does patient usually wear dentures?: No  CIWA:  CIWA-Ar Total: 0 COWS:     Musculoskeletal: Strength & Muscle Tone: within normal limits Gait & Station: normal Patient leans: N/A  Psychiatric Specialty Exam:  See MD SRA Physical Exam  ROS  Blood pressure 132/80, pulse 91, temperature 98.6 F (37 C), temperature source Oral, resp. rate 16, height 5' 10.5" (1.791 m), weight 61.7 kg (136 lb).Body mass index is 19.24 kg/m.    Have you used any form of tobacco in the last 30 days? (Cigarettes, Smokeless Tobacco, Cigars, and/or Pipes): Yes  Has this patient used any form of tobacco in the last 30 days? (Cigarettes, Smokeless Tobacco, Cigars, and/or  Pipes) Yes, N/A  Blood Alcohol level:  Lab Results  Component Value Date   ETH <5 12/11/2015   ETH <5 12/04/2015    Metabolic Disorder Labs:  Lab Results  Component Value Date   HGBA1C >15.5 (H) 12/13/2015   MPG >398 12/13/2015   MPG 364 05/29/2015   No results found for: PROLACTIN Lab Results  Component Value Date   CHOL 173 08/16/2008   TRIG 34 08/16/2008   HDL 76 08/16/2008   CHOLHDL 2.3 Ratio 08/16/2008   VLDL 7 08/16/2008   LDLCALC 90 08/16/2008   LDLCALC 98 12/20/2007    See Psychiatric Specialty Exam and Suicide Risk Assessment completed by Attending Physician prior to discharge.  Discharge destination:  Home  Is patient on multiple antipsychotic therapies at discharge:  No   Has Patient had three or more failed trials of antipsychotic monotherapy by history:  No  Recommended Plan for Multiple Antipsychotic Therapies: NA     Medication List    STOP taking these medications   insulin aspart 100 UNIT/ML injection Commonly known as:  novoLOG   nicotine 21 mg/24hr patch Commonly known as:  NICODERM CQ - dosed in mg/24 hours     TAKE these medications     Indication  buPROPion 150 MG 24 hr tablet Commonly known as:  WELLBUTRIN XL Take 1 tablet (150 mg total) by mouth daily.  Indication:  Major Depressive Disorder   hydrOXYzine 25 MG tablet Commonly known as:  ATARAX/VISTARIL Take 1 tablet (25 mg total) by mouth 3 (three) times daily as needed for anxiety.  Indication:  Anxiety Neurosis   insulin glargine 100 UNIT/ML injection Commonly known as:  LANTUS Inject 0.12 mLs (12 Units total) into the skin at bedtime. What changed:  how much to take  Indication:  Type 2 Diabetes   metFORMIN 1000 MG tablet Commonly known as:  GLUCOPHAGE Take 1 tablet (1,000 mg total) by mouth 2 (two) times daily with a meal. What changed:  additional instructions  Indication:  Type 2 Diabetes   traZODone 50 MG tablet Commonly known as:  DESYREL Take 1 tablet (50 mg  total) by mouth at bedtime and may repeat dose one time if needed. What changed:  when to take this  reasons to take this  Indication:  Trouble Sleeping      Follow-up Information    [facility] .   Specialty:  Behavioral Health Why:  Walk-in clinic Mondays, Wednesdays, and Fridays at 12:30pm for assessment for therapy and medication management services.  Contact information: [address] [place] [address] [phone]        [facility] .   Why:  New patient appointment for primary care services on Monday Oct. 23rd at 11am with Winfield Rast. Call office at [phone] if you need to reschedule.  Contact information: [address] [place] [phone]          Follow-up recommendations:  Activity:  as tol Diet:  as tol  Comments:  1.  Take all your medications as prescribed.   2.  Report any adverse side effects to outpatient provider. 3.  Patient instructed to not use alcohol or illegal drugs while on prescription medicines. 4.  In the event of worsening symptoms, instructed patient to call 911, the crisis hotline or go to nearest emergency room for evaluation of symptoms.  Signed: Lindwood Qua, NP [facility] 12/16/2015, 4:07 PM   Patient seen, Suicide Assessment Completed.  Disposition Plan Reviewed

## 2015-12-16 NOTE — Tx Team (Signed)
Interdisciplinary Treatment and Diagnostic Plan Update  12/16/2015 Time of Session: 9:30am James [name] L [name] MRN: [id number]  Principal Diagnosis: MDD (major depressive disorder), recurrent severe, without psychosis (HCC)  Secondary Diagnoses: Principal Problem:   MDD (major depressive disorder), recurrent severe, without psychosis (HCC) Active Problems:   Cocaine use disorder, severe, dependence (HCC)   Alcohol use disorder, mild, abuse   Diabetes mellitus (HCC)   Current Medications:  Current Facility-Administered Medications  Medication Dose Route Frequency Provider Last Rate Last Dose  . acetaminophen (TYLENOL) tablet 650 mg  650 mg Oral Q6H PRN Truman Haywardakia S Starkes, FNP      . alum & mag hydroxide-simeth (MAALOX/MYLANTA) 200-200-20 MG/5ML suspension 30 mL  30 mL Oral Q4H PRN Truman Haywardakia S Starkes, FNP      . buPROPion (WELLBUTRIN XL) 24 hr tablet 150 mg  150 mg Oral Daily Jomarie LongsSaramma Eappen, MD   150 mg at 12/16/15 0753  . hydrOXYzine (ATARAX/VISTARIL) tablet 25 mg  25 mg Oral TID PRN Truman Haywardakia S Starkes, FNP   25 mg at 12/15/15 2132  . insulin aspart (novoLOG) injection 0-5 Units  0-5 Units Subcutaneous QHS Oneta Rackanika N Lewis, NP   3 Units at 12/15/15 2022  . insulin aspart (novoLOG) injection 0-9 Units  0-9 Units Subcutaneous TID WC Oneta Rackanika N Lewis, NP   2 Units at 12/16/15 [phone]  . insulin aspart (novoLOG) injection 4 Units  4 Units Subcutaneous TID WC Oneta Rackanika N Lewis, NP   4 Units at 12/16/15 [phone]  . insulin glargine (LANTUS) injection 12 Units  12 Units Subcutaneous QHS Oneta Rackanika N Lewis, NP   12 Units at 12/15/15 2023  . magnesium hydroxide (MILK OF MAGNESIA) suspension 30 mL  30 mL Oral Daily PRN Truman Haywardakia S Starkes, FNP      . metFORMIN (GLUCOPHAGE) tablet 1,000 mg  1,000 mg Oral BID WC Jomarie LongsSaramma Eappen, MD   1,000 mg at 12/16/15 0753  . traZODone (DESYREL) tablet 50 mg  50 mg Oral QHS,MR X 1 Truman Haywardakia S Starkes, FNP   50 mg at 12/15/15 2132  . vitamin B-12 (CYANOCOBALAMIN) tablet 100 mcg  100 mcg Oral Daily Sanjuana KavaAgnes  I Nwoko, NP   100 mcg at 12/16/15 [id number]   PTA Medications: Prescriptions Prior to Admission  Medication Sig Dispense Refill Last Dose  . buPROPion (WELLBUTRIN XL) 150 MG 24 hr tablet Take 1 tablet (150 mg total) by mouth daily. (Patient not taking: Reported on 12/11/2015) 14 tablet 0 Not Taking at Unknown time  . insulin aspart (NOVOLOG) 100 UNIT/ML injection Inject 0-15 Units into the skin 3 (three) times daily with meals. SEE SLIDING SCALE [facility] (Patient not taking: Reported on 12/11/2015) 10 mL 0 Not Taking at Unknown time  . insulin aspart (NOVOLOG) 100 UNIT/ML injection Inject 0-5 Units into the skin at bedtime. (Patient not taking: Reported on 12/11/2015) 10 mL 0 Not Taking at Unknown time  . insulin glargine (LANTUS) 100 UNIT/ML injection Inject 0.1 mLs (10 Units total) into the skin at bedtime. (Patient not taking: Reported on 12/11/2015) 10 mL 0 Not Taking at Unknown time  . metFORMIN (GLUCOPHAGE) 1000 MG tablet Take 1 tablet (1,000 mg total) by mouth 2 (two) times daily with a meal. For diabetes management 42 tablet 1 12/10/2015 at Unknown time  . nicotine (NICODERM CQ - DOSED IN MG/24 HOURS) 21 mg/24hr patch Place 1 patch (21 mg total) onto the skin daily at 6 (six) AM. For smoking cessation (Patient taking differently: Place 21 mg onto the skin daily as needed (  for smoking cessation). For smoking cessation) 28 patch 0 unk  . traZODone (DESYREL) 50 MG tablet Take 1 tablet (50 mg total) by mouth at bedtime as needed for sleep. 14 tablet 0 unk    Treatment Modalities: Medication Management, Group therapy, Case management,  1 to 1 session with clinician, Psychoeducation, Recreational therapy.   Physician Treatment Plan for Primary Diagnosis: MDD (major depressive disorder), recurrent severe, without psychosis (HCC) Long Term Goal(s): Improvement in symptoms so as ready for discharge   Short Term Goals: Ability to identify changes in lifestyle to reduce recurrence of condition will improve,  Ability to identify and develop effective coping behaviors will improve and Ability to identify triggers associated with substance abuse/mental health issues will improve  Medication Management: Evaluate patient's response, side effects, and tolerance of medication regimen.  Therapeutic Interventions: 1 to 1 sessions, Unit Group sessions and Medication administration.  Evaluation of Outcomes: Adequate for Discharge   RN Treatment Plan for Primary Diagnosis: MDD (major depressive disorder), recurrent severe, without psychosis (HCC) Long Term Goal(s): Knowledge of disease and therapeutic regimen to maintain health will improve  Short Term Goals: Ability to remain free from injury will improve, Ability to disclose and discuss suicidal ideas and Ability to identify and develop effective coping behaviors will improve  Medication Management: RN will administer medications as ordered by provider, will assess and evaluate patient's response and provide education to patient for prescribed medication. RN will report any adverse and/or side effects to prescribing provider.  Therapeutic Interventions: 1 on 1 counseling sessions, Psychoeducation, Medication administration, Evaluate responses to treatment, Monitor vital signs and CBGs as ordered, Perform/monitor CIWA, COWS, AIMS and Fall Risk screenings as ordered, Perform wound care treatments as ordered.  Evaluation of Outcomes: Adequate for Discharge   LCSW Treatment Plan for Primary Diagnosis: MDD (major depressive disorder), recurrent severe, without psychosis (HCC) Long Term Goal(s): Safe transition to appropriate next level of care at discharge, Engage patient in therapeutic group addressing interpersonal concerns.  Short Term Goals: Engage patient in aftercare planning with referrals and resources, Increase emotional regulation and Facilitate patient progression through stages of change regarding substance use diagnoses and concerns  Therapeutic  Interventions: Assess for all discharge needs, 1 to 1 time with Social worker, Explore available resources and support systems, Assess for adequacy in community support network, Educate family and significant other(s) on suicide prevention, Complete Psychosocial Assessment, Interpersonal group therapy.  Evaluation of Outcomes: Adequate for Discharge     Progress in Treatment :  Attending groups: No  Participating in groups: No  Taking medication as prescribed: Yes, MD continuing to assess for appropriate medication regimen  Toleration medication: Yes  Family/Significant other contact made: No, patient has declined collateral contact  Patient understands diagnosis: Yes  Discussing patient identified problems/goals with staff: Yes  Medical problems stabilized or resolved: Yes  Denies suicidal/homicidal ideation: Yes, denies  Issues/concerns per patient self-inventory: None reported  Other: N/A  New problem(s) identified: None reported at this time    New Short Term/Long Term Goal(s): None at this time    Discharge Plan or Barriers: Home with outpatient services.     Reason for Continuation of Hospitalization: Anxiety Depression Medication stabilization Withdrawal symptoms  Estimated Length of Stay: Discharge anticipated for today 12/16/15   Attendees:  Patient:                        Physician: Dr. Jama Flavors & Dr. Elna Breslow , MD  12/15/2015   9:30am  Nursing: Elisha Headland, RN  12/15/2015 9:30am  RN Care Manager:   Social Workers: Belenda Cruise Pansie Guggisberg, LCSW, Chad Cordial, LCSW 12/15/2015 9:30am  Nurse Pratictioners: Gray Bernhardt, NP 12/15/15  Samuella Bruin, LCSW Clinical Social Worker [facility] [phone]

## 2015-12-16 NOTE — Progress Notes (Signed)
  [facility] Adult Case Management Discharge Plan :  Will you be returning to the same living situation after discharge:  Yes,  patient plans to return home At discharge, do you have transportation home?: Yes,  bus pass provided Do you have the ability to pay for your medications: Yes,  patient will be provided with prescriptions at discharge  Release of information consent forms completed and in the chart;  Patient's signature needed at discharge.  Patient to Follow up at: Follow-up Information    [facility] .   Specialty:  Behavioral Health Why:  Walk-in clinic Mondays, Wednesdays, and Fridays at 12:30pm for assessment for therapy and medication management services.  Contact information: [address] [place] [address] [phone]        [facility] .   Why:  New patient appointment for primary care services on Monday Oct. 23rd at 11am with Winfield Rast. Call office at [phone] if you need to reschedule.  Contact information: [address] [place] [address]          Next level of care provider has access to [facility] Link:no  Safety Planning and Suicide Prevention discussed: Yes,  with patient  Have you used any form of tobacco in the last 30 days? (Cigarettes, Smokeless Tobacco, Cigars, and/or Pipes): Yes  Has patient been referred to the Quitline?: Patient refused referral  Patient has been referred for addiction treatment: Yes  James [name] 12/16/2015, 10:45 AM

## 2015-12-16 NOTE — [facility] Suicide Risk Assessment (Addendum)
[facility]   Principal Problem: MDD (major depressive disorder), recurrent severe, without psychosis (HCC) Discharge Diagnoses:  Patient Active Problem List   Diagnosis Date Noted  . 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 Diabetes mellitus (HCC) [E11.9] 12/12/2015  . MDD (major depressive disorder), recurrent severe, without psychosis (HCC) [F33.2] 06/26/2015  . Cocaine use disorder, severe, dependence (HCC) [F14.20] 06/25/2015  . 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 MUSCLE SPASM, BACK [M53.80] 02/12/2009  . 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 HYPERCHOLESTEROLEMIA [E78.00] 05/09/2007  . SINUSITIS, CHRONIC [J32.9] 05/01/2007  . ALLERGIC RHINITIS [J30.9] 12/21/2006  . DIABETES MELLITUS, TYPE II [E11.9] 12/07/2006  . HYPERTENSION [I10] 12/07/2006    Total Time spent with patient: 30 minutes  Musculoskeletal: Strength & Muscle Tone: within normal limits Gait & Station: normal Patient leans: N/A  Psychiatric Specialty Exam: ROS  Blood pressure 132/80, pulse 91, temperature 98.6 F (37 C), temperature source Oral, resp. rate 16, height 5' 10.5" (1.791 m), weight 136 lb (61.7 kg).Body mass index is 19.24 kg/m.  General Appearance: Fairly Groomed  Patent attorneyye Contact::  Good  Speech:  Normal Rate409  Volume:  Normal  Mood:  reports his mood is improved   Affect:  Appropriate  Thought Process:  Linear  Orientation:  Other:  fully alert and attentive   Thought Content:  denies hallucinations, no delusions,not internally preoccupied   Suicidal Thoughts:  No- at this time denies suicidal ideations, denies self injurious ideations   Homicidal Thoughts:  No- denies any homicidal ideations   Memory:  recent and remote grossly intact   Judgement:  Other:  improved   Insight:  improved   Psychomotor Activity:  Normal  Concentration:  Good  Recall:  Good  Fund of Knowledge:Good   Language: Good  Akathisia:  Negative  Handed:  Right  AIMS (if indicated):     Assets:  Desire for Improvement Resilience  Sleep:  Number of Hours: 6.25  Cognition: WNL  ADL's:  Intact   Mental Status Per Nursing Assessment::   On Admission:  Suicidal ideation indicated by patient, Self-harm thoughts, Suicide plan (plan to shoot self)  Demographic Factors:  55 year old male married male, lives with family, currently unemployed   Loss Factors: Recent increased , regular cocaine abuse,  Unemployment   Historical Factors: History of depression, history of cocaine dependence   Risk Reduction Factors:   Sense of responsibility to family, Living with another person, especially a relative and Positive coping skills or problem solving skills  Continued Clinical Symptoms:  At this time patient is alert, attentive,well related, describes improved mood, affect reactive, no thought disorder, denies suicidal ideations, no self injurious ideations, no hallucinations, no delusions, future oriented  Denies any current medication side effects Reports feeling motivated to continue working on sobriety and relapse prevention, states he intends to start going to church regularly, and to go to [employer] meetings regularly, which he states helped him achieve years of sobriety in the past  He is currently on Wellbutrin trial- no side effects- we have reviewed side effects.   Cognitive Features That Contribute To Risk:  No gross cognitive deficits noted upon discharge. Is alert , attentive, and oriented x 3   Suicide Risk:  Mild:  Suicidal ideation of limited frequency, intensity, duration, and specificity.  There are no identifiable plans, no associated intent, mild dysphoria and related symptoms, good self-control (both objective and subjective assessment), few other risk  factors, and identifiable protective factors, including available and accessible social support.  Follow-up Information    [facility] .   Specialty:  Behavioral Health Why:  Walk-in clinic Mondays, Wednesdays, and Fridays at 12:30pm for assessment for therapy and medication management services.  Contact information: [address] [place] [address] [phone]        [facility] .   Why:  New patient appointment for primary care services on Monday Oct. 23rd at 11am with Winfield Rast. Call office at [phone] if you need to reschedule.  Contact information: [address] [place] [address]          Plan Of Care/Follow-up recommendations:  Activity:  as tolerated  Diet:  heart healthy, diabetic diet  Tests:  NA Other:  see below   Patient is requesting discharge and there are no current grounds for involuntary commitment  At this time he is leaving in good spirits  Plans to return home  Follow up as above  Encouraged to participate in 12 step meetings/ NA, which he states was very helpful in the past   Nehemiah Massed, MD 12/16/2015, 12:34 PM

## 2015-12-16 NOTE — Progress Notes (Signed)
Patient ID: James [name], male   DOB: 1961/03/05, 55 y.o.   MRN: [id number] Discharge note; Patient discharged home per MD order.  Patient received all personal belongings from locker and unit.  Reviewed AVS/transition record with patient.  He received prescriptions and samples of his medications.  Patient denies any thoughts of self harm.  He denies AVH/HI.  Patient left ambulatory for the bus station.

## 2015-12-16 NOTE — Progress Notes (Signed)
D   Pt reports he is very depressed and sad and still has trouble with anxiety     He interacts appropriately but minimally with others  He is compliant with medications and treatment    Pt reports he hopes to be discharged today and he feels more hopeful and that he has something to live for which was his family  A    Verbal support given   Medications administered and effectiveness monitored   Q15 min checks R     Pt safe at present and receptive to verbal support

## 2015-12-16 NOTE — [facility] Group Notes (Signed)
[facility] Group Notes:  (Nursing/MHT/Case Management/Adjunct)  Date:  12/16/2015  Time:  8:52 AM  Type of Therapy:  Psychoeducational Skills  Participation Level:  Minimal  Participation Quality:  Appropriate  Affect:  Appropriate  Cognitive:  Alert  Insight:  Improving  Engagement in Group:  Supportive  Modes of Intervention:  Support  Summary of Progress/Problems: Patient would like to speak with the Child psychotherapist.  He is concerned about the cost of his medications.  Patient has not been compliant with his insulin.    James [name] 12/16/2015, 8:52 AM

## 2015-12-17 LAB — HEMOGLOBIN A1C
Hgb A1c MFr Bld: >15.5 % — ABNORMAL HIGH (ref 4.8–5.6)
Mean Plasma Glucose: >398 mg/dL

## 2016-02-02 ENCOUNTER — Ambulatory Visit: Payer: Self-pay | Admitting: Family Medicine

## 2016-04-26 ENCOUNTER — Encounter (HOSPITAL_COMMUNITY): Payer: Self-pay

## 2016-04-26 ENCOUNTER — Inpatient Hospital Stay (HOSPITAL_COMMUNITY)
Admission: EM | Admit: 2016-04-26 | Discharge: 2016-05-02 | DRG: 286 | Disposition: A | Discharge status: Home / Self Care | Payer: Self-pay | Attending: Internal Medicine | Admitting: Internal Medicine

## 2016-04-26 ENCOUNTER — Inpatient Hospital Stay (HOSPITAL_COMMUNITY): Payer: Self-pay

## 2016-04-26 ENCOUNTER — Other Ambulatory Visit (HOSPITAL_COMMUNITY): Payer: Self-pay

## 2016-04-26 ENCOUNTER — Emergency Department (HOSPITAL_COMMUNITY): Payer: Self-pay

## 2016-04-26 DIAGNOSIS — F1411 Cocaine abuse, in remission: Secondary | ICD-10-CM

## 2016-04-26 DIAGNOSIS — Z8659 Personal history of other mental and behavioral disorders: Secondary | ICD-10-CM

## 2016-04-26 DIAGNOSIS — F17210 Nicotine dependence, cigarettes, uncomplicated: Secondary | ICD-10-CM | POA: Diagnosis present

## 2016-04-26 DIAGNOSIS — I429 Cardiomyopathy, unspecified: Secondary | ICD-10-CM

## 2016-04-26 DIAGNOSIS — Z794 Long term (current) use of insulin: Secondary | ICD-10-CM

## 2016-04-26 DIAGNOSIS — I272 Pulmonary hypertension, unspecified: Secondary | ICD-10-CM | POA: Diagnosis present

## 2016-04-26 DIAGNOSIS — I2510 Atherosclerotic heart disease of native coronary artery without angina pectoris: Secondary | ICD-10-CM | POA: Diagnosis present

## 2016-04-26 DIAGNOSIS — E1165 Type 2 diabetes mellitus with hyperglycemia: Secondary | ICD-10-CM | POA: Diagnosis present

## 2016-04-26 DIAGNOSIS — J189 Pneumonia, unspecified organism: Secondary | ICD-10-CM

## 2016-04-26 DIAGNOSIS — J9601 Acute respiratory failure with hypoxia: Secondary | ICD-10-CM | POA: Diagnosis present

## 2016-04-26 DIAGNOSIS — I509 Heart failure, unspecified: Secondary | ICD-10-CM

## 2016-04-26 DIAGNOSIS — Z833 Family history of diabetes mellitus: Secondary | ICD-10-CM

## 2016-04-26 DIAGNOSIS — Z79899 Other long term (current) drug therapy: Secondary | ICD-10-CM

## 2016-04-26 DIAGNOSIS — J9600 Acute respiratory failure, unspecified whether with hypoxia or hypercapnia: Secondary | ICD-10-CM

## 2016-04-26 DIAGNOSIS — Z8249 Family history of ischemic heart disease and other diseases of the circulatory system: Secondary | ICD-10-CM

## 2016-04-26 DIAGNOSIS — R05 Cough: Secondary | ICD-10-CM

## 2016-04-26 DIAGNOSIS — I428 Other cardiomyopathies: Secondary | ICD-10-CM | POA: Diagnosis present

## 2016-04-26 DIAGNOSIS — I110 Hypertensive heart disease with heart failure: Principal | ICD-10-CM | POA: Diagnosis present

## 2016-04-26 DIAGNOSIS — I5041 Acute combined systolic (congestive) and diastolic (congestive) heart failure: Secondary | ICD-10-CM | POA: Diagnosis present

## 2016-04-26 DIAGNOSIS — E872 Acidosis: Secondary | ICD-10-CM | POA: Diagnosis present

## 2016-04-26 DIAGNOSIS — I5042 Chronic combined systolic (congestive) and diastolic (congestive) heart failure: Secondary | ICD-10-CM | POA: Diagnosis present

## 2016-04-26 DIAGNOSIS — E559 Vitamin D deficiency, unspecified: Secondary | ICD-10-CM | POA: Diagnosis present

## 2016-04-26 DIAGNOSIS — R0600 Dyspnea, unspecified: Secondary | ICD-10-CM

## 2016-04-26 DIAGNOSIS — R059 Cough, unspecified: Secondary | ICD-10-CM

## 2016-04-26 HISTORY — DX: Pneumonia, unspecified organism: J18.9

## 2016-04-26 LAB — CBC WITH DIFFERENTIAL/PLATELET
BASOS ABS: 0 10*3/uL (ref 0.0–0.1)
BASOS PCT: 0 %
BASOS PCT: 0 %
Basophils Absolute: 0 10*3/uL (ref 0.0–0.1)
EOS ABS: 0.1 10*3/uL (ref 0.0–0.7)
Eosinophils Absolute: 0 10*3/uL (ref 0.0–0.7)
Eosinophils Relative: 1 %
Eosinophils Relative: 0 %
HEMATOCRIT: 37.6 % — AB (ref 39.0–52.0)
HEMATOCRIT: 36.3 % — AB (ref 39.0–52.0)
HEMOGLOBIN: 12.2 g/dL — AB (ref 13.0–17.0)
Hemoglobin: 12.8 g/dL — ABNORMAL LOW (ref 13.0–17.0)
Lymphocytes Relative: 15 %
Lymphocytes Relative: 3 %
Lymphs Abs: 1.1 10*3/uL (ref 0.7–4.0)
Lymphs Abs: 0.3 10*3/uL — ABNORMAL LOW (ref 0.7–4.0)
MCH: 28.6 pg (ref 26.0–34.0)
MCH: 28.6 pg (ref 26.0–34.0)
MCHC: 34 g/dL (ref 30.0–36.0)
MCHC: 33.6 g/dL (ref 30.0–36.0)
MCV: 83.9 fL (ref 78.0–100.0)
MCV: 85 fL (ref 78.0–100.0)
MONO ABS: 0.4 10*3/uL (ref 0.1–1.0)
MONOS PCT: 5 %
MONOS PCT: 1 %
Monocytes Absolute: 0.1 10*3/uL (ref 0.1–1.0)
NEUTROS ABS: 6.2 10*3/uL (ref 1.7–7.7)
NEUTROS ABS: 9.4 10*3/uL — AB (ref 1.7–7.7)
NEUTROS PCT: 79 %
NEUTROS PCT: 96 %
Platelets: 242 10*3/uL (ref 150–400)
Platelets: 251 10*3/uL (ref 150–400)
RBC: 4.48 MIL/uL (ref 4.22–5.81)
RBC: 4.27 MIL/uL (ref 4.22–5.81)
RDW: 13.5 % (ref 11.5–15.5)
RDW: 13.8 % (ref 11.5–15.5)
WBC: 7.8 10*3/uL (ref 4.0–10.5)
WBC: 9.8 10*3/uL (ref 4.0–10.5)

## 2016-04-26 LAB — GLUCOSE, CAPILLARY
GLUCOSE-CAPILLARY: 320 mg/dL — AB (ref 65–99)
Glucose-Capillary: 363 mg/dL — ABNORMAL HIGH (ref 65–99)
Glucose-Capillary: 319 mg/dL — ABNORMAL HIGH (ref 65–99)

## 2016-04-26 LAB — COMPREHENSIVE METABOLIC PANEL
ALK PHOS: 67 U/L (ref 38–126)
ALT: 47 U/L (ref 17–63)
ALT: 44 U/L (ref 17–63)
ANION GAP: 12 (ref 5–15)
ANION GAP: 11 (ref 5–15)
AST: 28 U/L (ref 15–41)
AST: 25 U/L (ref 15–41)
Albumin: 3.5 g/dL (ref 3.5–5.0)
Albumin: 3.2 g/dL — ABNORMAL LOW (ref 3.5–5.0)
Alkaline Phosphatase: 73 U/L (ref 38–126)
BILIRUBIN TOTAL: 0.5 mg/dL (ref 0.3–1.2)
BUN: 13 mg/dL (ref 6–20)
BUN: 11 mg/dL (ref 6–20)
CALCIUM: 8.2 mg/dL — AB (ref 8.9–10.3)
CHLORIDE: 100 mmol/L — AB (ref 101–111)
CO2: 25 mmol/L (ref 22–32)
CO2: 25 mmol/L (ref 22–32)
CREATININE: 0.98 mg/dL (ref 0.61–1.24)
Calcium: 8.7 mg/dL — ABNORMAL LOW (ref 8.9–10.3)
Chloride: 100 mmol/L — ABNORMAL LOW (ref 101–111)
Creatinine, Ser: 0.87 mg/dL (ref 0.61–1.24)
GFR calc non Af Amer: >60 mL/min (ref >60)
GFR calc non Af Amer: >60 mL/min (ref >60)
GLUCOSE: 329 mg/dL — AB (ref 65–99)
Glucose, Bld: 313 mg/dL — ABNORMAL HIGH (ref 65–99)
Potassium: 3.2 mmol/L — ABNORMAL LOW (ref 3.5–5.1)
Potassium: 3.7 mmol/L (ref 3.5–5.1)
SODIUM: 136 mmol/L (ref 135–145)
Sodium: 137 mmol/L (ref 135–145)
TOTAL PROTEIN: 6.6 g/dL (ref 6.5–8.1)
Total Bilirubin: 0.5 mg/dL (ref 0.3–1.2)
Total Protein: 6.5 g/dL (ref 6.5–8.1)

## 2016-04-26 LAB — BLOOD GAS, VENOUS
ACID-BASE EXCESS: 0.9 mmol/L (ref 0.0–2.0)
Bicarbonate: 25.9 mmol/L (ref 20.0–28.0)
FIO2: 100
O2 SAT: 87.9 %
PATIENT TEMPERATURE: 98.6
pCO2, Ven: 45.3 mmHg (ref 44.0–60.0)
pH, Ven: 7.375 (ref 7.250–7.430)
pO2, Ven: 56.9 mmHg — ABNORMAL HIGH (ref 32.0–45.0)

## 2016-04-26 LAB — D-DIMER, QUANTITATIVE (NOT AT ARMC): D DIMER QUANT: 0.33 ug{FEU}/mL (ref 0.00–0.50)

## 2016-04-26 LAB — RAPID URINE DRUG SCREEN, HOSP PERFORMED
AMPHETAMINES: NOT DETECTED
BARBITURATES: NOT DETECTED
BENZODIAZEPINES: NOT DETECTED
Cocaine: NOT DETECTED
Opiates: NOT DETECTED
Tetrahydrocannabinol: NOT DETECTED

## 2016-04-26 LAB — STREP PNEUMONIAE URINARY ANTIGEN: Strep Pneumo Urinary Antigen: NEGATIVE

## 2016-04-26 LAB — LACTIC ACID, PLASMA
LACTIC ACID, VENOUS: 2.2 mmol/L — AB (ref 0.5–1.9)
Lactic Acid, Venous: 1.6 mmol/L (ref 0.5–1.9)

## 2016-04-26 LAB — TROPONIN I
Troponin I: <0.03 ng/mL (ref <0.03)
Troponin I: 0.03 ng/mL (ref <0.03)
Troponin I: <0.03 ng/mL (ref <0.03)

## 2016-04-26 LAB — MRSA PCR SCREENING: MRSA BY PCR: NEGATIVE

## 2016-04-26 LAB — I-STAT CG4 LACTIC ACID, ED: Lactic Acid, Venous: 2.53 mmol/L (ref 0.5–1.9)

## 2016-04-26 LAB — BRAIN NATRIURETIC PEPTIDE: B Natriuretic Peptide: 236 pg/mL — ABNORMAL HIGH (ref 0.0–100.0)

## 2016-04-26 LAB — INFLUENZA PANEL BY PCR (TYPE A & B)
INFLAPCR: NEGATIVE
INFLBPCR: NEGATIVE

## 2016-04-26 MED ORDER — DEXTROSE 5 % IV SOLN
1.0000 g | Freq: Once | INTRAVENOUS | Status: AC
  Administered 2016-04-26: 1 g via INTRAVENOUS
  Filled 2016-04-26: qty 10

## 2016-04-26 MED ORDER — FUROSEMIDE 10 MG/ML IJ SOLN
20.0000 mg | Freq: Once | INTRAMUSCULAR | Status: AC
  Administered 2016-04-26: 20 mg via INTRAVENOUS
  Filled 2016-04-26: qty 2

## 2016-04-26 MED ORDER — ENOXAPARIN SODIUM 40 MG/0.4ML ~~LOC~~ SOLN
40.0000 mg | SUBCUTANEOUS | Status: DC
  Administered 2016-04-26 – 2016-05-02 (×6): 40 mg via SUBCUTANEOUS
  Filled 2016-04-26 (×6): qty 0.4

## 2016-04-26 MED ORDER — DOXYCYCLINE HYCLATE 100 MG PO TABS
100.0000 mg | ORAL_TABLET | Freq: Two times a day (BID) | ORAL | Status: DC
  Administered 2016-04-26: 100 mg via ORAL
  Filled 2016-04-26: qty 1

## 2016-04-26 MED ORDER — SODIUM CHLORIDE 0.9 % IV SOLN
1000.0000 mL | INTRAVENOUS | Status: DC
  Administered 2016-04-26: 1000 mL via INTRAVENOUS

## 2016-04-26 MED ORDER — ACETAMINOPHEN 325 MG PO TABS
650.0000 mg | ORAL_TABLET | Freq: Four times a day (QID) | ORAL | Status: DC | PRN
  Administered 2016-04-27 (×2): 650 mg via ORAL
  Filled 2016-04-26 (×2): qty 2

## 2016-04-26 MED ORDER — ONDANSETRON HCL 4 MG PO TABS: 4.0000 mg | ORAL_TABLET | Freq: Four times a day (QID) | ORAL | Status: DC | PRN

## 2016-04-26 MED ORDER — ACETAMINOPHEN 650 MG RE SUPP: 650.0000 mg | Freq: Four times a day (QID) | RECTAL | Status: DC | PRN

## 2016-04-26 MED ORDER — INSULIN ASPART 100 UNIT/ML ~~LOC~~ SOLN
0.0000 [IU] | Freq: Three times a day (TID) | SUBCUTANEOUS | Status: DC
  Administered 2016-04-26: 7 [IU] via SUBCUTANEOUS
  Administered 2016-04-26: 9 [IU] via SUBCUTANEOUS

## 2016-04-26 MED ORDER — CEFTRIAXONE SODIUM 1 G IJ SOLR
1.0000 g | INTRAMUSCULAR | Status: DC
  Administered 2016-04-27 – 2016-04-28 (×2): 1 g via INTRAVENOUS
  Filled 2016-04-26 (×2): qty 10

## 2016-04-26 MED ORDER — INSULIN GLARGINE 100 UNIT/ML ~~LOC~~ SOLN
12.0000 [IU] | Freq: Every day | SUBCUTANEOUS | Status: DC
  Administered 2016-04-26 – 2016-04-27 (×2): 12 [IU] via SUBCUTANEOUS
  Filled 2016-04-26 (×2): qty 0.12

## 2016-04-26 MED ORDER — DEXTROSE 5 % IV SOLN
500.0000 mg | INTRAVENOUS | Status: DC
  Administered 2016-04-26 – 2016-04-28 (×3): 500 mg via INTRAVENOUS
  Filled 2016-04-26 (×3): qty 500

## 2016-04-26 MED ORDER — ONDANSETRON HCL 4 MG/2ML IJ SOLN: 4.0000 mg | Freq: Four times a day (QID) | INTRAMUSCULAR | Status: DC | PRN

## 2016-04-26 MED ORDER — SODIUM CHLORIDE 0.9 % IV BOLUS (SEPSIS)
500.0000 mL | Freq: Once | INTRAVENOUS | Status: AC
  Administered 2016-04-26: 500 mL via INTRAVENOUS

## 2016-04-26 MED ORDER — INSULIN ASPART 100 UNIT/ML ~~LOC~~ SOLN
0.0000 [IU] | Freq: Three times a day (TID) | SUBCUTANEOUS | Status: DC
  Administered 2016-04-26: 11 [IU] via SUBCUTANEOUS
  Administered 2016-04-27 (×2): 3 [IU] via SUBCUTANEOUS
  Administered 2016-04-27: 5 [IU] via SUBCUTANEOUS
  Administered 2016-04-28: 8 [IU] via SUBCUTANEOUS
  Administered 2016-04-28: 5 [IU] via SUBCUTANEOUS
  Administered 2016-04-28: 8 [IU] via SUBCUTANEOUS
  Administered 2016-04-29: 5 [IU] via SUBCUTANEOUS
  Administered 2016-04-29: 8 [IU] via SUBCUTANEOUS

## 2016-04-26 MED ORDER — POTASSIUM CHLORIDE CRYS ER 20 MEQ PO TBCR
20.0000 meq | EXTENDED_RELEASE_TABLET | Freq: Once | ORAL | Status: AC
  Administered 2016-04-26: 20 meq via ORAL
  Filled 2016-04-26: qty 1

## 2016-04-26 MED ORDER — HYDRALAZINE HCL 20 MG/ML IJ SOLN: 10.0000 mg | Freq: Four times a day (QID) | INTRAMUSCULAR | Status: DC | PRN

## 2016-04-26 MED ORDER — ORAL CARE MOUTH RINSE
15.0000 mL | Freq: Two times a day (BID) | OROMUCOSAL | Status: DC
  Administered 2016-04-26 – 2016-05-02 (×10): 15 mL via OROMUCOSAL

## 2016-04-26 MED ORDER — DM-GUAIFENESIN ER 30-600 MG PO TB12
1.0000 | ORAL_TABLET | Freq: Two times a day (BID) | ORAL | Status: DC
  Administered 2016-04-26 – 2016-05-02 (×12): 1 via ORAL
  Filled 2016-04-26 (×12): qty 1

## 2016-04-26 MED ORDER — LORAZEPAM 2 MG/ML IJ SOLN
1.0000 mg | Freq: Once | INTRAMUSCULAR | Status: AC
  Administered 2016-04-26: 1 mg via INTRAVENOUS
  Filled 2016-04-26: qty 1

## 2016-04-26 NOTE — ED Notes (Signed)
No respiratory or acute distress noted alert and oriented x 3 call light in reach no reaction to medication noted. 

## 2016-04-26 NOTE — Progress Notes (Signed)
Pt HR in high 120's-130's sinus, restless, anxious, denies pain.  Paged MD coverage to notify.

## 2016-04-26 NOTE — H&P (Signed)
History and Physical    James [name]:[id number] DOB: 1960-06-17 DOA: 04/26/2016  PCP: Jacklynn Barnacle, NP  Patient coming from: Home.  Chief Complaint: Shortness of breath and cough.  HPI: James [name] is a 56 y.o. male with history of diabetes mellitus type 2 and previous history of drug abuse, as per the patient has not had any cocaine for last 3-4 months presents to the ER because of persistent chest tightness shortness of breath and productive cough over the last 2 days. Patient also has been a subjective feeling of fever chills. In the ER chest x-ray shows cardiomegaly with possible congestion and patient was mildly febrile. Lactate was elevated. Patient's shortness of breath is only on coughing. In the ER patient was given fluid bolus. Influenza PCR was negative. Patient was started on empiric antibiotics for pneumonia after blood cultures were obtained. Patient became progressively short of breath for which patient was placed on BiPAP. Patient is able to complete sentences without difficulty. Denies any nausea vomiting abdominal pain diarrhea. Denies any recent travel.   ED Course: Patient was placed on empiric antibiotics for pneumonia after 1 L bolus given. Blood cultures were obtained. Influenza PCR was negative. Since patient became progressively short of breath patient was placed on BiPAP.  Review of Systems: As per HPI, rest all negative.   Past Medical History:  Diagnosis Date  . Diabetes mellitus without complication [facility])     Past Surgical History:  Procedure Laterality Date  . FINGER SURGERY       reports that he has been smoking Cigarettes.  He has a 11.50 pack-year smoking history. He has never used smokeless tobacco. He reports that he drinks alcohol. He reports that he uses drugs, including Cocaine and "Crack" cocaine, about 3 times per week.  No Known Allergies  Family History  Problem Relation Age of Onset  . Diabetes Other   . Hypertension Other     . Diabetes Mother   . Brain cancer Father   . Mental illness Brother   . Drug abuse Brother   . Diabetes Brother     Prior to Admission medications   Medication Sig Start Date End Date Taking? Authorizing Provider  insulin aspart (NOVOLOG) 100 UNIT/ML injection Inject 0-20 Units into the skin 3 (three) times daily with meals as needed for high blood sugar. Pt uses as needed per sliding scale.   Yes Historical Provider, MD  insulin glargine (LANTUS) 100 UNIT/ML injection Inject 0.12 mLs (12 Units total) into the skin at bedtime. 12/16/15  Yes Adonis Brook, NP  metFORMIN (GLUCOPHAGE) 1000 MG tablet Take 1 tablet (1,000 mg total) by mouth 2 (two) times daily with a meal. 12/16/15  Yes Adonis Brook, NP    Physical Exam: Vitals:   04/26/16 0243 04/26/16 0331 04/26/16 0430  BP: 153/82  136/87  Pulse: (!) 128  117  Resp: (!) 38  (!) 38  Temp:  100.2 F (37.9 C)   TempSrc:  Rectal   SpO2:   90%  Weight: 61.7 kg (136 lb)    Height: 5\' 10"  (1.778 m)        Constitutional: Moderately built and nourished. Vitals:   04/26/16 0243 04/26/16 0331 04/26/16 0430  BP: 153/82  136/87  Pulse: (!) 128  117  Resp: (!) 38  (!) 38  Temp:  100.2 F (37.9 C)   TempSrc:  Rectal   SpO2:   90%  Weight: 61.7 kg (136 lb)    Height: 5'  10" (1.778 m)     Eyes: Anicteric no pallor. ENMT: No discharge from the ears eyes nose and mouth. Neck: No mass felt. No JVD appreciated. Respiratory: No rhonchi or crepitations. Cardiovascular: S1-S2 heard. Abdomen: Soft nontender bowel sounds present. No guarding or rigidity. Musculoskeletal: No edema. No joint effusion. Skin: No rash. Skin appears warm. Neurologic: Alert awake oriented to time place and person. Moves all extremities. Psychiatric: Appears normal. Normal affect.   Labs on Admission: I have personally reviewed following labs and imaging studies  CBC:  Recent Labs Lab 04/26/16 0300  WBC 7.8  NEUTROABS 6.2  HGB 12.8*  HCT 37.6*  MCV  83.9  PLT 242   Basic Metabolic Panel:  Recent Labs Lab 04/26/16 0300  NA 137  K 3.2*  CL 100*  CO2 25  GLUCOSE 313*  BUN 13  CREATININE 0.98  CALCIUM 8.7*   GFR: Estimated Creatinine Clearance: 72.6 mL/min (by C-G formula based on SCr of 0.98 mg/dL). Liver Function Tests:  Recent Labs Lab 04/26/16 0300  AST 28  ALT 47  ALKPHOS 73  BILITOT 0.5  PROT 6.6  ALBUMIN 3.5   No results for input(s): LIPASE, AMYLASE in the last 168 hours. No results for input(s): AMMONIA in the last 168 hours. Coagulation Profile: No results for input(s): INR, PROTIME in the last 168 hours. Cardiac Enzymes:  Recent Labs Lab 04/26/16 0300  TROPONINI <0.03   BNP (last 3 results) No results for input(s): PROBNP in the last 8760 hours. HbA1C: No results for input(s): HGBA1C in the last 72 hours. CBG: No results for input(s): GLUCAP in the last 168 hours. Lipid Profile: No results for input(s): CHOL, HDL, LDLCALC, TRIG, CHOLHDL, LDLDIRECT in the last 72 hours. Thyroid Function Tests: No results for input(s): TSH, T4TOTAL, FREET4, T3FREE, THYROIDAB in the last 72 hours. Anemia Panel: No results for input(s): VITAMINB12, FOLATE, FERRITIN, TIBC, IRON, RETICCTPCT in the last 72 hours. Urine analysis:    Component Value Date/Time   COLORURINE YELLOW 12/06/2012 1637   APPEARANCEUR CLEAR 12/06/2012 1637   LABSPEC 1.041 (H) 12/06/2012 1637   PHURINE 6.0 12/06/2012 1637   GLUCOSEU >1000 (A) 12/06/2012 1637   HGBUR NEGATIVE 12/06/2012 1637   HGBUR negative 08/06/2008 1124   BILIRUBINUR NEGATIVE 12/06/2012 1637   KETONESUR NEGATIVE 12/06/2012 1637   PROTEINUR NEGATIVE 12/06/2012 1637   UROBILINOGEN 1.0 12/06/2012 1637   NITRITE NEGATIVE 12/06/2012 1637   LEUKOCYTESUR NEGATIVE 12/06/2012 1637   Sepsis Labs: @LABRCNTIP (procalcitonin:4,lacticidven:4) )No results found for this or any previous visit (from the past 240 hour(s)).   Radiological Exams on Admission: Dg Chest Port 1  View  Result Date: 04/26/2016 CLINICAL DATA:  Cough and shortness of breath EXAM: PORTABLE CHEST 1 VIEW COMPARISON:  Chest radiograph 02/26/2012 FINDINGS: There is mild cardiomegaly. There are bibasilar hazy opacities. There are also ill-defined parahilar opacities. There is no pneumothorax. No sizable pleural effusion. IMPRESSION: 1. Mild cardiomegaly with bibasilar opacities, suspect pulmonary edema. Multifocal infection could have a similar appearance. 2. Ill-defined parahilar opacities. When the patient is clinically able, upright PA and lateral radiographs are recommended for further characterization to exclude hilar adenopathy. Electronically Signed   By: Deatra Robinson M.D.   On: 04/26/2016 03:37    EKG: Independently reviewed. Sinus tachycardia with LVH.  Assessment/Plan Principal Problem:   Acute respiratory failure with hypoxia (HCC) Active Problems:   Community acquired pneumonia   Controlled type 2 diabetes mellitus with hyperglycemia (HCC)   Uncontrolled type 2 diabetes mellitus with hyperglycemia (HCC)  1. Acute respiratory failure with hypoxia probably secondary pneumonia - patient has been placed on ceftriaxone and Zithromax. Patient's influenza PCR came negative. Check urine for Legionella and strep antigen and HIV status. Patient probably also has a CHF component. I'm ordering one dose of Lasix 20 mg IV. Check 2-D echo, d-dimer and troponin. Continue BiPAP for now. 2. Diabetes mellitus type 2 with hyperglycemia uncontrolled - patient states his last hemoglobin A1c was around 15. Not sure of compliance. Patient on Lantus 12 units at bedtime along with sliding scale coverage. Closely follow CBGs and it increased Lantus dose. Hold metformin while inpatient. 3. History of drug abuse - patient states he quit using drugs for last 4 months. Check urine drug screen. 4. Tobacco abuse - tobacco cessation counseling requested.   DVT prophylaxis: Lovenox. Code Status: Full code.   Family Communication: Discussed patient.  Disposition Plan: Home.  Consults called: None.  Admission status: Inpatient.    Eduard Clos MD [facility] Pager [phone].  If 7PM-7AM, please contact night-coverage [email or web address] Password [facility]  04/26/2016, 5:16 AM

## 2016-04-26 NOTE — Progress Notes (Signed)
NUTRITION NOTE   Lab Results  Component Value Date   HGBA1C >15.5 (H) 12/16/2015    RD provided "Carbohydrate Counting for People with Diabetes" AND "Label Reading Tips" handouts from the Academy of [employer] and Dietetics. Discussed different food groups and their effects on blood sugar, emphasizing carbohydrate-containing foods. Provided list of carbohydrates and recommended serving sizes of common foods.  Discussed importance of controlled and consistent carbohydrate intake throughout the day. Provided examples of ways to balance meals/snacks and encouraged intake of high-fiber, whole grain complex carbohydrates. Teach back method used.  Pt states that his wife, who is at bedside, also has DM and eats very well and checks CBGs often. He states "she is a great role model" but he states he often does not follow her recommendations. He reports that he is a recovering addict and was not taking care of himself and that he began checking CBGs and taking DM-related medication about 1 month PTA. Talked with pt about carb limits at meals and foods to add when he is hungry but has reached these limits. Reiterated several times the health-related consequences of poor glycemic control.   Expect fair compliance.  Body mass index is 21.35 kg/m. Pt meets criteria for normal weight based on current BMI.  Current diet order is Carb Modified/Heart Healthy, patient is consuming approximately 100% of meals at this time. Labs and medications reviewed. No further nutrition interventions warranted at this time. RD contact information provided. If additional nutrition issues arise, please re-consult RD.     Trenton Gammon, MS, RD, LDN, [facility] Inpatient Clinical Dietitian Pager # [phone] After hours/weekend pager # [phone]

## 2016-04-26 NOTE — ED Notes (Signed)
Bed: RESA Expected date:  Expected time:  Means of arrival:  Comments: 56 yo M/ SOB

## 2016-04-26 NOTE — Progress Notes (Signed)
PROGRESS NOTE    James [name]  ZOX:[id number] DOB: 1960-05-10 DOA: 04/26/2016 PCP: Jacklynn Barnacle, NP   Chief Complaint  Patient presents with  . Cough  . Shortness of Breath    Brief Narrative:  HPI on 04/26/2016 by Dr. Midge Minium  James [name] is a 56 y.o. male with history of diabetes mellitus type 2 and previous history of drug abuse, as per the patient has not had any cocaine for last 3-4 months presents to the ER because of persistent chest tightness shortness of breath and productive cough over the last 2 days. Patient also has been a subjective feeling of fever chills. In the ER chest x-ray shows cardiomegaly with possible congestion and patient was mildly febrile. Lactate was elevated. Patient's shortness of breath is only on coughing. In the ER patient was given fluid bolus. Influenza PCR was negative. Patient was started on empiric antibiotics for pneumonia after blood cultures were obtained. Patient became progressively short of breath for which patient was placed on BiPAP. Patient is able to complete sentences without difficulty. Denies any nausea vomiting abdominal pain diarrhea. Denies any recent travel. Assessment & Plan   Acute respiratory failure with hypoxia likely secondary to pneumonia -patient was placed on BiPAP.  Needed nonrebreather in the ER.  -No oxygen saturation noted below <90% -Chest x-ray: Bilateral airspace disease showing some improvement in the right lung base compatible with resolving pneumonia and/or edema, small bilateral pleural effusion -Strep pneumonia urine antigen negative -Influenza PCR negative -Will attempt to wean to nasal canula -DDimer normal  Elevated BNP -236 -Echocardiogram pending   Diabetes mellitus, type II -metformin held -Continue lantus, ISS, and CBG monitoring   History of drug abuse -Drug screen negative  Tobacco abuse -Counseled on smoking cessation  DVT Prophylaxis  lovenox  Code Status: Full  Family  Communication: None at bedside  Disposition Plan: Admitted. Continue to monitor in stepdown  Consultants None  Procedures  None  Antibiotics   Anti-infectives    Start     Dose/Rate Route Frequency Ordered Stop   04/27/16 0400  cefTRIAXone (ROCEPHIN) 1 g in dextrose 5 % 50 mL IVPB     1 g 100 mL/hr over 30 Minutes Intravenous Every 24 hours 04/26/16 0514 05/04/16 0359   04/26/16 1000  doxycycline (VIBRA-TABS) tablet 100 mg  Status:  Discontinued     100 mg Oral Every 12 hours 04/26/16 0359 04/26/16 0514   04/26/16 0600  azithromycin (ZITHROMAX) 500 mg in dextrose 5 % 250 mL IVPB     500 mg 250 mL/hr over 60 Minutes Intravenous Every 24 hours 04/26/16 0514 05/03/16 0559   04/26/16 0400  cefTRIAXone (ROCEPHIN) 1 g in dextrose 5 % 50 mL IVPB     1 g 100 mL/hr over 30 Minutes Intravenous  Once 04/26/16 0359 04/26/16 0438      Subjective:   [name] seen and examined today.  Currently on BiPAP. Feels breathing has improved. Denies chest pain, abdominal pain.   Objective:   Vitals:   04/26/16 1100 04/26/16 1200 04/26/16 1300 04/26/16 1400  BP: (!) 146/90 (!) 173/98 (!) 151/94 (!) 136/98  Pulse:      Resp: (!) 35 20 (!) 28 (!) 30  Temp:  98.1 F (36.7 C)    TempSrc:  Oral    SpO2: 98% 99% 93% 97%  Weight:      Height:        Intake/Output Summary (Last 24 hours) at 04/26/16 1435 Last data filed  at 04/26/16 1000  Gross per 24 hour  Intake              250 ml  Output              525 ml  Net             -275 ml   Filed Weights   04/26/16 0243 04/26/16 0600  Weight: 61.7 kg (136 lb) 71.4 kg (157 lb 6.5 oz)    Exam  General: Well developed, well nourished, NAD, appears stated age  HEENT: NCAT,BiPAP  Cardiovascular: S1 S2 auscultated, no rubs, murmurs or gallops. tachycardic  Respiratory: Mild crackles noted.   Abdomen: Soft, nontender, nondistended, + bowel sounds  Extremities: warm dry without cyanosis clubbing. LE edema  Neuro: AAOx3,  nonfocal  Psych: Normal affect and demeanor    Data Reviewed: I have personally reviewed following labs and imaging studies  CBC:  Recent Labs Lab 04/26/16 0300 04/26/16 0554  WBC 7.8 9.8  NEUTROABS 6.2 9.4*  HGB 12.8* 12.2*  HCT 37.6* 36.3*  MCV 83.9 85.0  PLT 242 251   Basic Metabolic Panel:  Recent Labs Lab 04/26/16 0300 04/26/16 0554  NA 137 136  K 3.2* 3.7  CL 100* 100*  CO2 25 25  GLUCOSE 313* 329*  BUN 13 11  CREATININE 0.98 0.87  CALCIUM 8.7* 8.2*   GFR: Estimated Creatinine Clearance: 94.6 mL/min (by C-G formula based on SCr of 0.87 mg/dL). Liver Function Tests:  Recent Labs Lab 04/26/16 0300 04/26/16 0554  AST 28 25  ALT 47 44  ALKPHOS 73 67  BILITOT 0.5 0.5  PROT 6.6 6.5  ALBUMIN 3.5 3.2*   No results for input(s): LIPASE, AMYLASE in the last 168 hours. No results for input(s): AMMONIA in the last 168 hours. Coagulation Profile: No results for input(s): INR, PROTIME in the last 168 hours. Cardiac Enzymes:  Recent Labs Lab 04/26/16 0300 04/26/16 0554 04/26/16 1150  TROPONINI <0.03 0.03* <0.03   BNP (last 3 results) No results for input(s): PROBNP in the last 8760 hours. HbA1C: No results for input(s): HGBA1C in the last 72 hours. CBG:  Recent Labs Lab 04/26/16 0737 04/26/16 1204  GLUCAP 363* 320*   Lipid Profile: No results for input(s): CHOL, HDL, LDLCALC, TRIG, CHOLHDL, LDLDIRECT in the last 72 hours. Thyroid Function Tests: No results for input(s): TSH, T4TOTAL, FREET4, T3FREE, THYROIDAB in the last 72 hours. Anemia Panel: No results for input(s): VITAMINB12, FOLATE, FERRITIN, TIBC, IRON, RETICCTPCT in the last 72 hours. Urine analysis:    Component Value Date/Time   COLORURINE YELLOW 12/06/2012 1637   APPEARANCEUR CLEAR 12/06/2012 1637   LABSPEC 1.041 (H) 12/06/2012 1637   PHURINE 6.0 12/06/2012 1637   GLUCOSEU >1000 (A) 12/06/2012 1637   HGBUR NEGATIVE 12/06/2012 1637   HGBUR negative 08/06/2008 1124    BILIRUBINUR NEGATIVE 12/06/2012 1637   KETONESUR NEGATIVE 12/06/2012 1637   PROTEINUR NEGATIVE 12/06/2012 1637   UROBILINOGEN 1.0 12/06/2012 1637   NITRITE NEGATIVE 12/06/2012 1637   LEUKOCYTESUR NEGATIVE 12/06/2012 1637   Sepsis Labs: @LABRCNTIP (procalcitonin:4,lacticidven:4)  ) Recent Results (from the past 240 hour(s))  MRSA PCR Screening     Status: None   Collection Time: 04/26/16  6:36 AM  Result Value Ref Range Status   MRSA by PCR NEGATIVE NEGATIVE Final    Comment:        The GeneXpert MRSA Assay (FDA approved for NASAL specimens only), is one component of a comprehensive MRSA colonization surveillance program. It  is not intended to diagnose MRSA infection nor to guide or monitor treatment for MRSA infections.       Radiology Studies: Dg Chest 2 View  Result Date: 04/26/2016 CLINICAL DATA:  Acute respiratory failure.  Shortness of breath EXAM: CHEST  2 VIEW COMPARISON:  PA and lateral chest 02/26/2012. Single-view of the chest 04/26/2016. FINDINGS: Patchy bilateral airspace disease persists. Aeration appears mildly improved in the right lung base. There is cardiomegaly. No pneumothorax. Small bilateral pleural effusions are seen. IMPRESSION: Bilateral airspace disease shows some improvement in the right lung base compatible with resolving pneumonia and/or edema. Small bilateral pleural effusions. Cardiomegaly. Electronically Signed   By: Drusilla Kanner M.D.   On: 04/26/2016 10:39   Dg Chest Port 1 View  Result Date: 04/26/2016 CLINICAL DATA:  Cough and shortness of breath EXAM: PORTABLE CHEST 1 VIEW COMPARISON:  Chest radiograph 02/26/2012 FINDINGS: There is mild cardiomegaly. There are bibasilar hazy opacities. There are also ill-defined parahilar opacities. There is no pneumothorax. No sizable pleural effusion. IMPRESSION: 1. Mild cardiomegaly with bibasilar opacities, suspect pulmonary edema. Multifocal infection could have a similar appearance. 2. Ill-defined  parahilar opacities. When the patient is clinically able, upright PA and lateral radiographs are recommended for further characterization to exclude hilar adenopathy. Electronically Signed   By: Deatra Robinson M.D.   On: 04/26/2016 03:37     Scheduled Meds: . azithromycin  500 mg Intravenous Q24H  . [START ON 04/27/2016] cefTRIAXone (ROCEPHIN)  IV  1 g Intravenous Q24H  . enoxaparin (LOVENOX) injection  40 mg Subcutaneous Q24H  . insulin aspart  0-9 Units Subcutaneous TID WC  . insulin glargine  12 Units Subcutaneous QHS  . mouth rinse  15 mL Mouth Rinse BID   Continuous Infusions:   LOS: 0 days   Time Spent in minutes   30 minutes  Dandrae Kustra D.O. on 04/26/2016 at 2:35 PM  Between 7am to 7pm - Pager - [phone]  After 7pm go to [email or web address] - password [email or web address]  And look for the night coverage person covering for me after hours  [facility] Office  [phone]

## 2016-04-26 NOTE — ED Provider Notes (Signed)
[facility]-EMERGENCY DEPT Provider Note   CSN: [id number] Arrival date & time: 04/26/16  0241   By signing my name below, I, Clarisse Gouge, attest that this documentation has been prepared under the direction and in the presence of Blane Ohara, MD. Electronically signed, Clarisse Gouge, ED Scribe. 04/26/16. 3:20 AM.   History   Chief Complaint Chief Complaint  Patient presents with  . Cough  . Shortness of Breath   The history is provided by the patient and medical records. No language interpreter was used.    HPI Comments: James [name] is a 56 y.o. male BIB EMS who presents to the Emergency Department complaining of sudden onset, worsening SOB x ~4 hours. He notes associated cough, nausea, left sided arm/finger pain and chest tightness. Hx DM noted. Given albuterol via duo nebulizer by EMS in route, per nurse. No Hx of asthma, COPD or heart failure noted. Hx of drug abuse noted.  States he has not used illicit drugs since 01/2016. Pt denies fever/chills and recent infectious disease diagnosis.  Past Medical History:  Diagnosis Date  . Diabetes mellitus without complication [facility])     Patient Active Problem List   Diagnosis Date Noted  . Cardiomyopathy- suspect NICM but etiology not yet determined 04/27/2016  . Acute combined systolic and diastolic heart failure (HCC) 04/27/2016  . History of major depression 04/27/2016  . Community acquired pneumonia 04/26/2016  . Acute respiratory failure with hypoxia (HCC) 04/26/2016  . Controlled type 2 diabetes mellitus with hyperglycemia (HCC) 04/26/2016  . Uncontrolled type 2 diabetes mellitus with hyperglycemia (HCC) 04/26/2016  . Alcohol use disorder, mild, abuse 12/12/2015  . MDD (major depressive disorder), recurrent severe, without psychosis (HCC) 06/26/2015  . History of cocaine abuse 06/25/2015  . SPONDYLOSIS, CERVICAL, WITH RADICULOPATHY 02/25/2009  . MUSCLE SPASM, BACK 02/12/2009  . LEUKOPENIA, MILD 08/19/2008  . ECZEMA, HANDS  08/06/2008  . HYPERCHOLESTEROLEMIA 05/09/2007  . SINUSITIS, CHRONIC 05/01/2007  . ALLERGIC RHINITIS 12/21/2006  . HYPERTENSION 12/07/2006    Past Surgical History:  Procedure Laterality Date  . FINGER SURGERY         Home Medications    Prior to Admission medications   Medication Sig Start Date End Date Taking? Authorizing Provider  insulin aspart (NOVOLOG) 100 UNIT/ML injection Inject 0-20 Units into the skin 3 (three) times daily with meals as needed for high blood sugar. Pt uses as needed per sliding scale.   Yes Historical Provider, MD  insulin glargine (LANTUS) 100 UNIT/ML injection Inject 0.12 mLs (12 Units total) into the skin at bedtime. 12/16/15  Yes Adonis Brook, NP  metFORMIN (GLUCOPHAGE) 1000 MG tablet Take 1 tablet (1,000 mg total) by mouth 2 (two) times daily with a meal. 12/16/15  Yes Adonis Brook, NP    Family History Family History  Problem Relation Age of Onset  . Diabetes Other   . Hypertension Other   . Diabetes Mother   . Brain cancer Father   . Mental illness Brother   . Drug abuse Brother   . Diabetes Brother     Social History Social History  Substance Use Topics  . Smoking status: Current Every Day Smoker    Packs/day: 0.50    Years: 23.00    Types: Cigarettes  . Smokeless tobacco: Never Used  . Alcohol use Yes     Allergies   Patient has no known allergies.   Review of Systems Review of Systems  Constitutional: Positive for fatigue.  Respiratory: Positive for cough and shortness of  breath.   Gastrointestinal: Positive for nausea.  Musculoskeletal: Positive for myalgias.  Neurological: Positive for weakness and numbness.  All other systems reviewed and are negative.    Physical Exam Updated Vital Signs BP 103/72   Pulse 92   Temp 97.5 F (36.4 C) (Oral)   Resp 18   Ht 6' (1.829 m) Comment: Per Pt.  Wt 147 lb 9.6 oz (67 kg)   SpO2 98%   BMI 20.02 kg/m   Physical Exam  Constitutional: He appears well-developed and  well-nourished. No distress.  HENT:  Head: Normocephalic.  Mouth/Throat: Mucous membranes are dry.  Eyes: Conjunctivae and EOM are normal. Pupils are equal, round, and reactive to light. No scleral icterus.  Neck: Normal range of motion. Neck supple.  Cardiovascular: Regular rhythm, normal heart sounds and intact distal pulses.  Tachycardia present.   No murmur heard. Pulmonary/Chest: Tachypnea noted. He is in respiratory distress. He has rales.  Crackles to bilateral lungs. - significant leg swelling.  Abdominal: Soft. There is no tenderness.  Musculoskeletal: Normal range of motion. He exhibits no edema or tenderness.  Neurological: He is alert.  Skin: Skin is warm and dry.  Nursing note and vitals reviewed.  ED Treatments / Results  DIAGNOSTIC STUDIES: Oxygen Saturation is 99% on NRB, by my interpretation.    COORDINATION OF CARE: 3:20 AM Discussed treatment plan with pt at bedside and pt agreed to plan.  Labs (all labs ordered are listed, but only abnormal results are displayed) Labs Reviewed  COMPREHENSIVE METABOLIC PANEL - Abnormal; Notable for the following:       Result Value   Potassium 3.2 (*)    Chloride 100 (*)    Glucose, Bld 313 (*)    Calcium 8.7 (*)    All other components within normal limits  CBC WITH DIFFERENTIAL/PLATELET - Abnormal; Notable for the following:    Hemoglobin 12.8 (*)    HCT 37.6 (*)    All other components within normal limits  BLOOD GAS, VENOUS - Abnormal; Notable for the following:    pO2, Ven 56.9 (*)    All other components within normal limits  BRAIN NATRIURETIC PEPTIDE - Abnormal; Notable for the following:    B Natriuretic Peptide 236.0 (*)    All other components within normal limits  TROPONIN I - Abnormal; Notable for the following:    Troponin I 0.03 (*)    All other components within normal limits  LACTIC ACID, PLASMA - Abnormal; Notable for the following:    Lactic Acid, Venous 2.2 (*)    All other components within normal  limits  COMPREHENSIVE METABOLIC PANEL - Abnormal; Notable for the following:    Chloride 100 (*)    Glucose, Bld 329 (*)    Calcium 8.2 (*)    Albumin 3.2 (*)    All other components within normal limits  CBC WITH DIFFERENTIAL/PLATELET - Abnormal; Notable for the following:    Hemoglobin 12.2 (*)    HCT 36.3 (*)    Neutro Abs 9.4 (*)    Lymphs Abs 0.3 (*)    All other components within normal limits  GLUCOSE, CAPILLARY - Abnormal; Notable for the following:    Glucose-Capillary 363 (*)    All other components within normal limits  GLUCOSE, CAPILLARY - Abnormal; Notable for the following:    Glucose-Capillary 320 (*)    All other components within normal limits  BASIC METABOLIC PANEL - Abnormal; Notable for the following:    Glucose, Bld 153 (*)  Calcium 8.7 (*)    All other components within normal limits  GLUCOSE, CAPILLARY - Abnormal; Notable for the following:    Glucose-Capillary 319 (*)    All other components within normal limits  GLUCOSE, CAPILLARY - Abnormal; Notable for the following:    Glucose-Capillary 119 (*)    All other components within normal limits  GLUCOSE, CAPILLARY - Abnormal; Notable for the following:    Glucose-Capillary 157 (*)    All other components within normal limits  GLUCOSE, CAPILLARY - Abnormal; Notable for the following:    Glucose-Capillary 182 (*)    All other components within normal limits  HEMOGLOBIN A1C - Abnormal; Notable for the following:    Hgb A1c MFr Bld 13.2 (*)    All other components within normal limits  GLUCOSE, CAPILLARY - Abnormal; Notable for the following:    Glucose-Capillary 221 (*)    All other components within normal limits  GLUCOSE, CAPILLARY - Abnormal; Notable for the following:    Glucose-Capillary 174 (*)    All other components within normal limits  BASIC METABOLIC PANEL - Abnormal; Notable for the following:    Sodium 133 (*)    Chloride 99 (*)    Glucose, Bld 276 (*)    Calcium 8.5 (*)    All other  components within normal limits  GLUCOSE, CAPILLARY - Abnormal; Notable for the following:    Glucose-Capillary 295 (*)    All other components within normal limits  GLUCOSE, CAPILLARY - Abnormal; Notable for the following:    Glucose-Capillary 265 (*)    All other components within normal limits  VITAMIN D 25 HYDROXY (VIT D DEFICIENCY, FRACTURES) - Abnormal; Notable for the following:    Vit D, 25-Hydroxy 26.4 (*)    All other components within normal limits  GLUCOSE, CAPILLARY - Abnormal; Notable for the following:    Glucose-Capillary 245 (*)    All other components within normal limits  GLUCOSE, CAPILLARY - Abnormal; Notable for the following:    Glucose-Capillary 273 (*)    All other components within normal limits  BASIC METABOLIC PANEL - Abnormal; Notable for the following:    Sodium 133 (*)    Chloride 98 (*)    Glucose, Bld 199 (*)    Calcium 8.6 (*)    All other components within normal limits  GLUCOSE, CAPILLARY - Abnormal; Notable for the following:    Glucose-Capillary 208 (*)    All other components within normal limits  GLUCOSE, CAPILLARY - Abnormal; Notable for the following:    Glucose-Capillary 227 (*)    All other components within normal limits  GLUCOSE, CAPILLARY - Abnormal; Notable for the following:    Glucose-Capillary 272 (*)    All other components within normal limits  GLUCOSE, CAPILLARY - Abnormal; Notable for the following:    Glucose-Capillary 472 (*)    All other components within normal limits  BASIC METABOLIC PANEL - Abnormal; Notable for the following:    Sodium 134 (*)    Chloride 99 (*)    Glucose, Bld 277 (*)    Calcium 8.8 (*)    All other components within normal limits  CBC WITH DIFFERENTIAL/PLATELET - Abnormal; Notable for the following:    WBC 3.0 (*)    Neutro Abs 1.0 (*)    All other components within normal limits  GLUCOSE, CAPILLARY - Abnormal; Notable for the following:    Glucose-Capillary 117 (*)    All other components  within normal limits  GLUCOSE, CAPILLARY -  Abnormal; Notable for the following:    Glucose-Capillary 227 (*)    All other components within normal limits  GLUCOSE, CAPILLARY - Abnormal; Notable for the following:    Glucose-Capillary 233 (*)    All other components within normal limits  GLUCOSE, CAPILLARY - Abnormal; Notable for the following:    Glucose-Capillary 193 (*)    All other components within normal limits  I-STAT CG4 LACTIC ACID, ED - Abnormal; Notable for the following:    Lactic Acid, Venous 2.53 (*)    All other components within normal limits  CULTURE, BLOOD (ROUTINE X 2)  CULTURE, BLOOD (ROUTINE X 2)  URINE CULTURE  MRSA PCR SCREENING  CULTURE, EXPECTORATED SPUTUM-ASSESSMENT  INFLUENZA PANEL BY PCR (TYPE A & B, H1N1)  TROPONIN I  TROPONIN I  TROPONIN I  LACTIC ACID, PLASMA  HIV ANTIBODY (ROUTINE TESTING)  STREP PNEUMONIAE URINARY ANTIGEN  LEGIONELLA PNEUMOPHILA SEROGP 1 UR AG  RAPID URINE DRUG SCREEN, HOSP PERFORMED  D-DIMER, QUANTITATIVE (NOT AT [name])  LACTIC ACID, PLASMA  LIPID PANEL  GLUCOSE, CAPILLARY  PROTIME-INR  PATHOLOGIST SMEAR REVIEW  CBC    EKG  EKG Interpretation  Date/Time:  Monday April 26 2016 02:43:26 EST Ventricular Rate:  127 PR Interval:    QRS Duration: 84 QT Interval:  302 QTC Calculation: 439 R Axis:   88 Text Interpretation:  Sinus tachycardia Multiform ventricular premature complexes Probable left atrial enlargement Probable left ventricular hypertrophy Anterior Q waves, possibly due to LVH Nonspecific T abnormalities, inferior leads Confirmed by Jodi Mourning MD, Calvyn Kurtzman [phone]) on 04/26/2016 3:11:36 AM       Radiology Dg Chest 2 View  Result Date: 04/30/2016 CLINICAL DATA:  Pneumonia, cough EXAM: CHEST  2 VIEW COMPARISON:  04/26/2016 FINDINGS: Cardiomediastinal silhouette is stable. Improvement in aeration. Residual small streaky atelectasis, infiltrate or scarring right base posterior medially. No pulmonary edema. IMPRESSION:  Improvement in aeration. Residual small streaky atelectasis, infiltrate or scarring right base posterior medially. No pulmonary edema. Electronically Signed   By: Natasha Mead M.D.   On: 04/30/2016 09:23    Procedures Procedures (including critical care time) CRITICAL CARE Performed by: Enid Skeens   Total critical care time: 75 minutes  Critical care time was exclusive of separately billable procedures and treating other patients.  Critical care was necessary to treat or prevent imminent or life-threatening deterioration.  Critical care was time spent personally by me on the following activities: development of treatment plan with patient and/or surrogate as well as nursing, discussions with consultants, evaluation of patient's response to treatment, examination of patient, obtaining history from patient or surrogate, ordering and performing treatments and interventions, ordering and review of laboratory studies, ordering and review of radiographic studies, pulse oximetry and re-evaluation of patient's condition.    EMERGENCY DEPARTMENT [place] CARDIAC EXAM "Study: Limited Ultrasound of the heart and pericardium"  INDICATIONS:Dyspnea Multiple views of the heart and pericardium were obtained in real-time with a multi-frequency probe.  PERFORMED IO:NGEXBM  IMAGES ARCHIVED?: Yes  FINDINGS: No pericardial effusion  LIMITATIONS:  Body habitus  VIEWS USED: Subcostal 4 chamber, Parasternal long axis, Parasternal short axis and Apical 4 chamber   INTERPRETATION: Cardiac activity present and Pericardial effusioin absent  CPT Code: 84132-44 (limited transthoracic cardiac)  Medications Ordered in ED Medications  acetaminophen (TYLENOL) tablet 650 mg ( Oral MAR Hold 04/30/16 1415)    Or  acetaminophen (TYLENOL) suppository 650 mg ( Rectal MAR Hold 04/30/16 1415)  ondansetron (ZOFRAN) tablet 4 mg ( Oral MAR Hold 04/30/16  1415)    Or  ondansetron (ZOFRAN) injection 4 mg ( Intravenous MAR  Hold 04/30/16 1415)  enoxaparin (LOVENOX) injection 40 mg ( Subcutaneous Automatically Held 05/12/16 1000)  MEDLINE mouth rinse ( Mouth Rinse Automatically Held 05/12/16 2200)  dextromethorphan-guaiFENesin (MUCINEX DM) 30-600 MG per 12 hr tablet 1 tablet ( Oral Automatically Held 05/12/16 2200)  hydrALAZINE (APRESOLINE) injection 10 mg ( Intravenous MAR Hold 04/30/16 1415)  carvedilol (COREG) tablet 12.5 mg ( Oral Automatically Held 05/13/16 1700)  aspirin chewable tablet 81 mg ( Oral Automatically Held 05/13/16 1000)  atorvastatin (LIPITOR) tablet 40 mg ( Oral Automatically Held 05/13/16 1800)  insulin glargine (LANTUS) injection 15 Units ( Subcutaneous Automatically Held 05/14/16 2200)  azithromycin (ZITHROMAX) tablet 500 mg ( Oral Automatically Held 05/02/16 0600)  lisinopril (PRINIVIL,ZESTRIL) tablet 10 mg ( Oral Automatically Held 05/15/16 1000)  0.9 %  sodium chloride infusion ( Intravenous New Bag/Given 04/30/16 0833)  insulin aspart (novoLOG) injection 0-9 Units ( Subcutaneous Automatically Held 05/15/16 1700)  insulin aspart (novoLOG) injection 3 Units ( Subcutaneous Automatically Held 05/15/16 1700)  insulin aspart (novoLOG) injection 0-5 Units ( Subcutaneous Automatically Held 05/15/16 2200)  cefTRIAXone (ROCEPHIN) 1 g in dextrose 5 % 50 mL IVPB (0 g Intravenous Stopped 04/26/16 0438)  furosemide (LASIX) injection 20 mg (20 mg Intravenous Given 04/26/16 0845)  potassium chloride SA (K-DUR,KLOR-CON) CR tablet 20 mEq (20 mEq Oral Given 04/26/16 1005)  LORazepam (ATIVAN) injection 1 mg (1 mg Intravenous Given 04/26/16 2245)  sodium chloride 0.9 % bolus 500 mL (500 mLs Intravenous Given 04/26/16 2245)  insulin glargine (LANTUS) injection 10 Units (10 Units Subcutaneous Given 04/28/16 2228)  Vitamin D (Ergocalciferol) (DRISDOL) capsule 50,000 Units (50,000 Units Oral Given 04/29/16 1605)  insulin glargine (LANTUS) injection 7 Units (7 Units Subcutaneous Given 04/29/16 2255)     Initial Impression / Assessment and  Plan / ED Course  I have reviewed the triage vital signs and the nursing notes.  Pertinent labs & imaging results that were available during my care of the patient were reviewed by me and considered in my medical decision making (see chart for details).    Patient presents with worsening shortness breath and cough. Patient feels fever and chills. Started yesterday. Patient on nonrebreathing the ER. Antibiotics and sepsis screening done. Chest x-ray concerning for pneumonia also home edema on the differential. Plan for cardiac screen.  The patients results and plan were reviewed and discussed.   Any x-rays performed were independently reviewed by myself.   Differential diagnosis were considered with the presenting HPI.  Medications  acetaminophen (TYLENOL) tablet 650 mg ( Oral MAR Hold 04/30/16 1415)    Or  acetaminophen (TYLENOL) suppository 650 mg ( Rectal MAR Hold 04/30/16 1415)  ondansetron (ZOFRAN) tablet 4 mg ( Oral MAR Hold 04/30/16 1415)    Or  ondansetron (ZOFRAN) injection 4 mg ( Intravenous MAR Hold 04/30/16 1415)  enoxaparin (LOVENOX) injection 40 mg ( Subcutaneous Automatically Held 05/12/16 1000)  MEDLINE mouth rinse ( Mouth Rinse Automatically Held 05/12/16 2200)  dextromethorphan-guaiFENesin (MUCINEX DM) 30-600 MG per 12 hr tablet 1 tablet ( Oral Automatically Held 05/12/16 2200)  hydrALAZINE (APRESOLINE) injection 10 mg ( Intravenous MAR Hold 04/30/16 1415)  carvedilol (COREG) tablet 12.5 mg ( Oral Automatically Held 05/13/16 1700)  aspirin chewable tablet 81 mg ( Oral Automatically Held 05/13/16 1000)  atorvastatin (LIPITOR) tablet 40 mg ( Oral Automatically Held 05/13/16 1800)  insulin glargine (LANTUS) injection 15 Units ( Subcutaneous Automatically Held 05/14/16 2200)  azithromycin (ZITHROMAX) tablet  500 mg ( Oral Automatically Held 05/02/16 0600)  lisinopril (PRINIVIL,ZESTRIL) tablet 10 mg ( Oral Automatically Held 05/15/16 1000)  0.9 %  sodium chloride infusion ( Intravenous New  Bag/Given 04/30/16 0833)  insulin aspart (novoLOG) injection 0-9 Units ( Subcutaneous Automatically Held 05/15/16 1700)  insulin aspart (novoLOG) injection 3 Units ( Subcutaneous Automatically Held 05/15/16 1700)  insulin aspart (novoLOG) injection 0-5 Units ( Subcutaneous Automatically Held 05/15/16 2200)  cefTRIAXone (ROCEPHIN) 1 g in dextrose 5 % 50 mL IVPB (0 g Intravenous Stopped 04/26/16 0438)  furosemide (LASIX) injection 20 mg (20 mg Intravenous Given 04/26/16 0845)  potassium chloride SA (K-DUR,KLOR-CON) CR tablet 20 mEq (20 mEq Oral Given 04/26/16 1005)  LORazepam (ATIVAN) injection 1 mg (1 mg Intravenous Given 04/26/16 2245)  sodium chloride 0.9 % bolus 500 mL (500 mLs Intravenous Given 04/26/16 2245)  insulin glargine (LANTUS) injection 10 Units (10 Units Subcutaneous Given 04/28/16 2228)  Vitamin D (Ergocalciferol) (DRISDOL) capsule 50,000 Units (50,000 Units Oral Given 04/29/16 1605)  insulin glargine (LANTUS) injection 7 Units (7 Units Subcutaneous Given 04/29/16 2255)    Vitals:   04/30/16 0547 04/30/16 0823 04/30/16 1039 04/30/16 1438  BP: 113/77 111/72 103/72   Pulse: 87 92    Resp: 18 18    Temp: 98.1 F (36.7 C) 97.5 F (36.4 C)    TempSrc: Oral Oral    SpO2: 98% 97%  98%  Weight:      Height:        Final diagnoses:  Acute dyspnea  Community acquired pneumonia, unspecified laterality  Cough  CHF (congestive heart failure) (HCC)    Admission/ observation were discussed with the admitting physician, patient and/or family and they are comfortable with the plan.    Final Clinical Impressions(s) / ED Diagnoses   Final diagnoses:  Acute dyspnea  Community acquired pneumonia, unspecified laterality  Cough  CHF (congestive heart failure) [facility])    New Prescriptions Current Discharge Medication List       Blane Ohara, MD 04/30/16 1526

## 2016-04-26 NOTE — Progress Notes (Signed)
CRITICAL VALUE ALERT  Critical value received:  Troponin 0.03  Date of notification:  04/26/2016  Time of notification:  0700  Critical value read back:Yes.    Nurse who received alert:  Donella Stade, RN  MD notified (1st page):  M. Mikhail  Time of first page:  [phone]  MD notified (2nd page):  Time of second page:  Responding MD:  Nunzio Cory  Time MD responded:  [phone]

## 2016-04-26 NOTE — ED Triage Notes (Signed)
Cough x couple of days and shortness of breath at home no fever voiced alert and oriented x 3.

## 2016-04-27 ENCOUNTER — Inpatient Hospital Stay (HOSPITAL_COMMUNITY): Payer: Self-pay

## 2016-04-27 DIAGNOSIS — I429 Cardiomyopathy, unspecified: Secondary | ICD-10-CM

## 2016-04-27 DIAGNOSIS — Z8659 Personal history of other mental and behavioral disorders: Secondary | ICD-10-CM

## 2016-04-27 DIAGNOSIS — R0600 Dyspnea, unspecified: Secondary | ICD-10-CM

## 2016-04-27 DIAGNOSIS — I5041 Acute combined systolic (congestive) and diastolic (congestive) heart failure: Secondary | ICD-10-CM

## 2016-04-27 DIAGNOSIS — I5040 Unspecified combined systolic (congestive) and diastolic (congestive) heart failure: Secondary | ICD-10-CM

## 2016-04-27 DIAGNOSIS — I420 Dilated cardiomyopathy: Secondary | ICD-10-CM

## 2016-04-27 DIAGNOSIS — I5042 Chronic combined systolic (congestive) and diastolic (congestive) heart failure: Secondary | ICD-10-CM | POA: Diagnosis present

## 2016-04-27 LAB — BASIC METABOLIC PANEL
ANION GAP: 8 (ref 5–15)
BUN: 19 mg/dL (ref 6–20)
CALCIUM: 8.7 mg/dL — AB (ref 8.9–10.3)
CO2: 26 mmol/L (ref 22–32)
Chloride: 101 mmol/L (ref 101–111)
Creatinine, Ser: 0.94 mg/dL (ref 0.61–1.24)
GFR calc Af Amer: >60 mL/min (ref >60)
GFR calc non Af Amer: >60 mL/min (ref >60)
GLUCOSE: 153 mg/dL — AB (ref 65–99)
Potassium: 4.1 mmol/L (ref 3.5–5.1)
Sodium: 135 mmol/L (ref 135–145)

## 2016-04-27 LAB — GLUCOSE, CAPILLARY
GLUCOSE-CAPILLARY: 174 mg/dL — AB (ref 65–99)
Glucose-Capillary: 119 mg/dL — ABNORMAL HIGH (ref 65–99)
Glucose-Capillary: 157 mg/dL — ABNORMAL HIGH (ref 65–99)
Glucose-Capillary: 182 mg/dL — ABNORMAL HIGH (ref 65–99)
Glucose-Capillary: 221 mg/dL — ABNORMAL HIGH (ref 65–99)
Glucose-Capillary: 295 mg/dL — ABNORMAL HIGH (ref 65–99)

## 2016-04-27 LAB — ECHOCARDIOGRAM COMPLETE
HEIGHTINCHES: 72 in
Weight: 2585.55 oz

## 2016-04-27 LAB — URINE CULTURE: Culture: NO GROWTH

## 2016-04-27 LAB — HIV ANTIBODY (ROUTINE TESTING W REFLEX): HIV SCREEN 4TH GENERATION: NONREACTIVE

## 2016-04-27 LAB — LACTIC ACID, PLASMA: LACTIC ACID, VENOUS: 1.1 mmol/L (ref 0.5–1.9)

## 2016-04-27 MED ORDER — FUROSEMIDE 10 MG/ML IJ SOLN
20.0000 mg | Freq: Two times a day (BID) | INTRAMUSCULAR | Status: DC
  Administered 2016-04-27 – 2016-04-29 (×4): 20 mg via INTRAVENOUS
  Filled 2016-04-27 (×4): qty 2

## 2016-04-27 MED ORDER — LISINOPRIL 10 MG PO TABS
20.0000 mg | ORAL_TABLET | Freq: Every day | ORAL | Status: DC
  Administered 2016-04-27 – 2016-04-28 (×2): 20 mg via ORAL
  Filled 2016-04-27 (×3): qty 2

## 2016-04-27 MED ORDER — CARVEDILOL 12.5 MG PO TABS
12.5000 mg | ORAL_TABLET | Freq: Two times a day (BID) | ORAL | Status: DC
  Administered 2016-04-27 – 2016-05-02 (×9): 12.5 mg via ORAL
  Filled 2016-04-27 (×9): qty 1

## 2016-04-27 MED ORDER — ATORVASTATIN CALCIUM 40 MG PO TABS
40.0000 mg | ORAL_TABLET | Freq: Every day | ORAL | Status: DC
  Administered 2016-04-27 – 2016-05-01 (×4): 40 mg via ORAL
  Filled 2016-04-27 (×4): qty 1

## 2016-04-27 MED ORDER — ASPIRIN 81 MG PO CHEW
81.0000 mg | CHEWABLE_TABLET | Freq: Every day | ORAL | Status: DC
  Administered 2016-04-27 – 2016-05-02 (×6): 81 mg via ORAL
  Filled 2016-04-27 (×6): qty 1

## 2016-04-27 NOTE — Progress Notes (Signed)
Inpatient Diabetes Program Recommendations  AACE/ADA: New Consensus Statement on Inpatient Glycemic Control (2015)  Target Ranges:  Prepandial:   less than 140 mg/dL      Peak postprandial:   less than 180 mg/dL (1-2 hours)      Critically ill patients:  140 - 180 mg/dL   Results for KHAMARION, LEAK (MRN [id number]) as of 04/27/2016 12:14  Ref. Range 04/26/2016 07:37 04/26/2016 12:04 04/26/2016 16:15 04/26/2016 21:26  Glucose-Capillary Latest Ref Range: 65 - 99 mg/dL 211 (H) 155 (H) 208 (H) 119 (H)   Results for CASTLE, DOUGALL (MRN [id number]) as of 04/27/2016 12:14  Ref. Range 04/27/2016 07:59 04/27/2016 11:39  Glucose-Capillary Latest Ref Range: 65 - 99 mg/dL 449 (H) 753 (H)    Home DM Meds: Lantus 12 units QHS        Novolog 0-20 units TID       Metformin 1000 mg BID  Current Insulin Orders: Lantus 12 units QHS      Novolog Sensitive Correction Scale/ SSI (0-9 units) TID AC     MD- Please consider the following in-hospital insulin adjustments:  1. Increase Lantus to 15 units QHS  2. Start Novolog Meal Coverage: Novolog 3 units TID with meals (hold if pt eats <50% of meal)     --Will follow patient during hospitalization--  Ambrose Finland RN, MSN, CDE Diabetes Coordinator Inpatient Glycemic Control Team Team Pager: [phone] (8a-5p)

## 2016-04-27 NOTE — Progress Notes (Addendum)
PROGRESS NOTE    James [name]  WKM:[id number] DOB: [birth year] DOA: 04/26/2016 PCP: Jacklynn Barnacle, NP   Chief Complaint  Patient presents with  . Cough  . Shortness of Breath    Brief Narrative:  James [name] is a 56 y.o. male with history of diabetes mellitus type 2 and previous history of drug abuse, as per the patient has not had any cocaine for last 3-4 months presents to the ER because of persistent chest tightness shortness of breath and productive cough over the last 2 days. Patient also has been a subjective feeling of fever chills. In the ER chest x-ray shows cardiomegaly with possible congestion and patient was mildly febrile. Lactate was elevated. Patient's shortness of breath is only on coughing. In the ER patient was given fluid bolus. Influenza PCR was negative. Patient was started on empiric antibiotics for pneumonia after blood cultures were obtained. Patient became progressively short of breath for which patient was placed on BiPAP. Has been weaned to [facility].   Echo showed EF20-25%, cardiology consulted  Assessment & Plan   Acute respiratory failure with hypoxia likely secondary to pneumonia -patient was placed on BiPAP.  Needed nonrebreather in the ER.  -No oxygen saturation noted below <90% -Chest x-ray: Bilateral airspace disease showing some improvement in the right lung base compatible with resolving pneumonia and/or edema, small bilateral pleural effusion -Strep pneumonia urine antigen negative. Legionella urine antigen pending  -Blood cultures pending  -Influenza PCR negative -Weaned to nasal canula -DDimer normal  New Combined systolic and diastolic CHF -BNP on admission 236 -Echocardiogram EF 20-25%, Grade 3 diastolic dysfunction -Will start patient on Coreg and ACEi -Cardiology consulted and appreciated -Patient did receive one dose of lasix on admission  -Will start on Lasix 20mg  IV BID -Monitor intake/output, daily weights -Lipid panel  ordered  Lactic acidosis -Likely secondary to the above, resolved  Diabetes mellitus, type II -metformin held -Continue lantus, ISS, and CBG monitoring  -hemoglobin A1c pending  History of drug abuse -Drug screen negative  Tobacco abuse -Counseled on smoking cessation  DVT Prophylaxis  lovenox  Code Status: Full  Family Communication: None at bedside  Disposition Plan: Admitted. Continue to monitor in stepdown  Consultants Cardiology  Procedures  Echocardiogram  Antibiotics   Anti-infectives    Start     Dose/Rate Route Frequency Ordered Stop   04/27/16 0400  cefTRIAXone (ROCEPHIN) 1 g in dextrose 5 % 50 mL IVPB     1 g 100 mL/hr over 30 Minutes Intravenous Every 24 hours 04/26/16 0514 05/04/16 0359   04/26/16 1000  doxycycline (VIBRA-TABS) tablet 100 mg  Status:  Discontinued     100 mg Oral Every 12 hours 04/26/16 0359 04/26/16 0514   04/26/16 0600  azithromycin (ZITHROMAX) 500 mg in dextrose 5 % 250 mL IVPB     500 mg 250 mL/hr over 60 Minutes Intravenous Every 24 hours 04/26/16 0514 05/03/16 0559   04/26/16 0400  cefTRIAXone (ROCEPHIN) 1 g in dextrose 5 % 50 mL IVPB     1 g 100 mL/hr over 30 Minutes Intravenous  Once 04/26/16 0359 04/26/16 0438      Subjective:   James [name] seen and examined today.  Feels breathing has mildly improved.  Continues to have cough. Denies chest pain at this time.  Denies abdominal pain, N/V/D/C.   Objective:   Vitals:   04/27/16 0609 04/27/16 0700 04/27/16 0800 04/27/16 0900  BP: 116/71  123/76 (!) 151/106  Pulse:  Resp: (!) 28 (!) 30 (!) 30 (!) 36  Temp:   98.7 F (37.1 C)   TempSrc:   Oral   SpO2: 94% 92% 96% 94%  Weight:      Height:        Intake/Output Summary (Last 24 hours) at 04/27/16 1118 Last data filed at 04/27/16 0730  Gross per 24 hour  Intake              900 ml  Output             1325 ml  Net             -425 ml   Filed Weights   04/26/16 0243 04/26/16 0600 04/27/16 0500  Weight: 61.7  kg (136 lb) 71.4 kg (157 lb 6.5 oz) 73.3 kg (161 lb 9.6 oz)    Exam  General: Well developed, well nourished, NAD, appears stated age  HEENT: NCAT, mucous membranes moist  Cardiovascular: S1 S2 auscultated, no rubs, murmurs or gallops. tachycardic  Respiratory: Diminished breath sounds, +rhonchi  Abdomen: Soft, nontender, nondistended, + bowel sounds  Extremities: warm dry without cyanosis clubbing. Trace LE edema (improved)  Neuro: AAOx3, nonfocal  Psych: Normal affect and demeanor,pleasant   Data Reviewed: I have personally reviewed following labs and imaging studies  CBC:  Recent Labs Lab 04/26/16 0300 04/26/16 0554  WBC 7.8 9.8  NEUTROABS 6.2 9.4*  HGB 12.8* 12.2*  HCT 37.6* 36.3*  MCV 83.9 85.0  PLT 242 251   Basic Metabolic Panel:  Recent Labs Lab 04/26/16 0300 04/26/16 0554 04/27/16 0332  NA 137 136 135  K 3.2* 3.7 4.1  CL 100* 100* 101  CO2 25 25 26   GLUCOSE 313* 329* 153*  BUN 13 11 19   CREATININE 0.98 0.87 0.94  CALCIUM 8.7* 8.2* 8.7*   GFR: Estimated Creatinine Clearance: 89.9 mL/min (by C-G formula based on SCr of 0.94 mg/dL). Liver Function Tests:  Recent Labs Lab 04/26/16 0300 04/26/16 0554  AST 28 25  ALT 47 44  ALKPHOS 73 67  BILITOT 0.5 0.5  PROT 6.6 6.5  ALBUMIN 3.5 3.2*   No results for input(s): LIPASE, AMYLASE in the last 168 hours. No results for input(s): AMMONIA in the last 168 hours. Coagulation Profile: No results for input(s): INR, PROTIME in the last 168 hours. Cardiac Enzymes:  Recent Labs Lab 04/26/16 0300 04/26/16 0554 04/26/16 1150 04/26/16 1643  TROPONINI <0.03 0.03* <0.03 <0.03   BNP (last 3 results) No results for input(s): PROBNP in the last 8760 hours. HbA1C: No results for input(s): HGBA1C in the last 72 hours. CBG:  Recent Labs Lab 04/26/16 1204 04/26/16 1615 04/26/16 2126 04/26/16 2344 04/27/16 0759  GLUCAP 320* 319* 119* 157* 182*   Lipid Profile: No results for input(s): CHOL,  HDL, LDLCALC, TRIG, CHOLHDL, LDLDIRECT in the last 72 hours. Thyroid Function Tests: No results for input(s): TSH, T4TOTAL, FREET4, T3FREE, THYROIDAB in the last 72 hours. Anemia Panel: No results for input(s): VITAMINB12, FOLATE, FERRITIN, TIBC, IRON, RETICCTPCT in the last 72 hours. Urine analysis:    Component Value Date/Time   COLORURINE YELLOW 12/06/2012 1637   APPEARANCEUR CLEAR 12/06/2012 1637   LABSPEC 1.041 (H) 12/06/2012 1637   PHURINE 6.0 12/06/2012 1637   GLUCOSEU >1000 (A) 12/06/2012 1637   HGBUR NEGATIVE 12/06/2012 1637   HGBUR negative 08/06/2008 1124   BILIRUBINUR NEGATIVE 12/06/2012 1637   KETONESUR NEGATIVE 12/06/2012 1637   PROTEINUR NEGATIVE 12/06/2012 1637   UROBILINOGEN 1.0 12/06/2012 1637  NITRITE NEGATIVE 12/06/2012 1637   LEUKOCYTESUR NEGATIVE 12/06/2012 1637   Sepsis Labs: @LABRCNTIP (procalcitonin:4,lacticidven:4)  ) Recent Results (from the past 240 hour(s))  MRSA PCR Screening     Status: None   Collection Time: 04/26/16  6:36 AM  Result Value Ref Range Status   MRSA by PCR NEGATIVE NEGATIVE Final    Comment:        The GeneXpert MRSA Assay (FDA approved for NASAL specimens only), is one component of a comprehensive MRSA colonization surveillance program. It is not intended to diagnose MRSA infection nor to guide or monitor treatment for MRSA infections.       Radiology Studies: Dg Chest 2 View  Result Date: 04/26/2016 CLINICAL DATA:  Acute respiratory failure.  Shortness of breath EXAM: CHEST  2 VIEW COMPARISON:  PA and lateral chest 02/26/2012. Single-view of the chest 04/26/2016. FINDINGS: Patchy bilateral airspace disease persists. Aeration appears mildly improved in the right lung base. There is cardiomegaly. No pneumothorax. Small bilateral pleural effusions are seen. IMPRESSION: Bilateral airspace disease shows some improvement in the right lung base compatible with resolving pneumonia and/or edema. Small bilateral pleural effusions.  Cardiomegaly. Electronically Signed   By: Drusilla Kanner M.D.   On: 04/26/2016 10:39   Dg Chest Port 1 View  Result Date: 04/26/2016 CLINICAL DATA:  Cough and shortness of breath EXAM: PORTABLE CHEST 1 VIEW COMPARISON:  Chest radiograph 02/26/2012 FINDINGS: There is mild cardiomegaly. There are bibasilar hazy opacities. There are also ill-defined parahilar opacities. There is no pneumothorax. No sizable pleural effusion. IMPRESSION: 1. Mild cardiomegaly with bibasilar opacities, suspect pulmonary edema. Multifocal infection could have a similar appearance. 2. Ill-defined parahilar opacities. When the patient is clinically able, upright PA and lateral radiographs are recommended for further characterization to exclude hilar adenopathy. Electronically Signed   By: Deatra Robinson M.D.   On: 04/26/2016 03:37     Scheduled Meds: . azithromycin  500 mg Intravenous Q24H  . cefTRIAXone (ROCEPHIN)  IV  1 g Intravenous Q24H  . dextromethorphan-guaiFENesin  1 tablet Oral BID  . enoxaparin (LOVENOX) injection  40 mg Subcutaneous Q24H  . insulin aspart  0-15 Units Subcutaneous TID WC  . insulin glargine  12 Units Subcutaneous QHS  . mouth rinse  15 mL Mouth Rinse BID   Continuous Infusions:   LOS: 1 day   Time Spent in minutes   30 minutes  Alexiss Iturralde D.O. on 04/27/2016 at 11:18 AM  Between 7am to 7pm - Pager - [phone]  After 7pm go to [email or web address] - password [email or web address]  And look for the night coverage person covering for me after hours  [facility] Office  [phone]

## 2016-04-27 NOTE — Progress Notes (Signed)
Pt had 8 beats Vtach, asymptomatic, HR 90's-paged on call cardiology to notify.

## 2016-04-27 NOTE — Consult Note (Signed)
Reason for Consult:   CHF  Requesting Physician: [facility] Primary Cardiologist New  HPI:   56 y.o.malewith history of diabetes mellitus type 2 and previous history of drug abuse (drug screen negative this adm), as per the patient has not had any cocaine for last 3-4 months. He presented to the ER 04/26/16 with chest tightness, coughing, and shortness of breath for about one week.. Patient has also had fever and chills. In the ER chest x-ray showed cardiomegaly with possible congestion. The patient was mildly febrile. In the ER patient was given fluid bolus for suspected sepsis. Influenza PCR was negative. Patient was started on empiric antibiotics for pneumonia after blood cultures were obtained. Patient became progressively short of breath for which patient was placed on BiPAP. Echo today showed an of EF20-25% with grade 3 DD and mild to moderate MR, cardiology consulted. The pt denied any history of chest pain to me-just chest pain when he coughed.     PMHx:  Past Medical History:  Diagnosis Date  . Diabetes mellitus without complication [facility])     Past Surgical History:  Procedure Laterality Date  . FINGER SURGERY      SOCHx:  reports that he has been smoking Cigarettes.  He has a 11.50 pack-year smoking history. He has never used smokeless tobacco. He reports that he drinks alcohol. He reports that he uses drugs, including Cocaine and "Crack" cocaine, about 3 times per week. Married, lives with his wife. 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    FAMHx: Family History  Problem Relation Age of Onset  . Diabetes Other   . Hypertension Other   . Diabetes Mother   . Brain cancer Father   . Mental illness Brother   . Drug abuse Brother   . Diabetes Brother     ALLERGIES: No Known Allergies  ROS: Review of Systems: General: negative for night sweats or weight changes.  Cardiovascular: negative for chest pain, edema, orthopnea, palpitations,  paroxysmal nocturnal dyspnea  HEENT: negative for any visual disturbances, blindness, glaucoma Dermatological: negative for rash Respiratory: negative for  hemoptysis Urologic: negative for hematuria or dysuria Abdominal: negative for nausea, vomiting, diarrhea, bright red blood per rectum, melena, or hematemesis Neurologic: negative for visual changes, syncope, or dizziness Musculoskeletal: negative for back pain, joint pain, or swelling Psych: cooperative and appropriate All other systems reviewed and are otherwise negative except as noted above.   HOME MEDICATIONS: Prior to Admission medications   Medication Sig Start Date End Date Taking? Authorizing Provider  insulin aspart (NOVOLOG) 100 UNIT/ML injection Inject 0-20 Units into the skin 3 (three) times daily with meals as needed for high blood sugar. Pt uses as needed per sliding scale.   Yes Historical Provider, MD  insulin glargine (LANTUS) 100 UNIT/ML injection Inject 0.12 mLs (12 Units total) into the skin at bedtime. 12/16/15  Yes Adonis Brook, NP  metFORMIN (GLUCOPHAGE) 1000 MG tablet Take 1 tablet (1,000 mg total) by mouth 2 (two) times daily with a meal. 12/16/15  Yes Adonis Brook, NP    HOSPITAL MEDICATIONS: I have reviewed the patient's current medications.  VITALS: Blood pressure (!) 146/83, pulse 113, temperature 99.9 F (37.7 C), temperature source Axillary, resp. rate (!) 31, height 6' (1.829 m), weight 161 lb 9.6 oz (73.3 kg), SpO2 96 %.  PHYSICAL EXAM: General appearance: alert, cooperative, cachectic, mild distress and poor dentition, on nasal O2 Neck: no carotid bruit and no JVD Lungs: decreased breath  sounds overall with end expiratory wheezing Heart: regular rate and rhythm and tachycardic Abdomen: soft, non-tender; bowel sounds normal; no masses,  no organomegaly Extremities: no edema Pulses: diminnished Skin: Skin color, texture, turgor normal. No rashes or lesions Neurologic: Grossly  normal  LABS: Results for orders placed or performed during the hospital encounter of 04/26/16 (from the past 24 hour(s))  Glucose, capillary     Status: Abnormal   Collection Time: 04/26/16  4:15 PM  Result Value Ref Range   Glucose-Capillary 319 (H) 65 - 99 mg/dL  Troponin I (q 6hr x 3)     Status: None   Collection Time: 04/26/16  4:43 PM  Result Value Ref Range   Troponin I <0.03 <0.03 ng/mL  Glucose, capillary     Status: Abnormal   Collection Time: 04/26/16  9:26 PM  Result Value Ref Range   Glucose-Capillary 119 (H) 65 - 99 mg/dL   Comment 1 Notify RN    Comment 2 Document in Chart   Glucose, capillary     Status: Abnormal   Collection Time: 04/26/16 11:44 PM  Result Value Ref Range   Glucose-Capillary 157 (H) 65 - 99 mg/dL   Comment 1 Notify RN    Comment 2 Document in Chart   Basic metabolic panel     Status: Abnormal   Collection Time: 04/27/16  3:32 AM  Result Value Ref Range   Sodium 135 135 - 145 mmol/L   Potassium 4.1 3.5 - 5.1 mmol/L   Chloride 101 101 - 111 mmol/L   CO2 26 22 - 32 mmol/L   Glucose, Bld 153 (H) 65 - 99 mg/dL   BUN 19 6 - 20 mg/dL   Creatinine, Ser 1.61 0.61 - 1.24 mg/dL   Calcium 8.7 (L) 8.9 - 10.3 mg/dL   GFR calc non Af Amer >60 >60 mL/min   GFR calc Af Amer >60 >60 mL/min   Anion gap 8 5 - 15  Glucose, capillary     Status: Abnormal   Collection Time: 04/27/16  7:59 AM  Result Value Ref Range   Glucose-Capillary 182 (H) 65 - 99 mg/dL  Lactic acid, plasma     Status: None   Collection Time: 04/27/16  8:35 AM  Result Value Ref Range   Lactic Acid, Venous 1.1 0.5 - 1.9 mmol/L  Glucose, capillary     Status: Abnormal   Collection Time: 04/27/16 11:39 AM  Result Value Ref Range   Glucose-Capillary 221 (H) 65 - 99 mg/dL    EKG: NSR, ST, LVH with repol changes, one PVC  IMAGING: Dg Chest 2 View  Result Date: 04/26/2016 CLINICAL DATA:  Acute respiratory failure.  Shortness of breath EXAM: CHEST  2 VIEW COMPARISON:  PA and lateral  chest 02/26/2012. Single-view of the chest 04/26/2016. FINDINGS: Patchy bilateral airspace disease persists. Aeration appears mildly improved in the right lung base. There is cardiomegaly. No pneumothorax. Small bilateral pleural effusions are seen. IMPRESSION: Bilateral airspace disease shows some improvement in the right lung base compatible with resolving pneumonia and/or edema. Small bilateral pleural effusions. Cardiomegaly. Electronically Signed   By: Drusilla Kanner M.D.   On: 04/26/2016 10:39   Dg Chest Port 1 View  Result Date: 04/26/2016 CLINICAL DATA:  Cough and shortness of breath EXAM: PORTABLE CHEST 1 VIEW COMPARISON:  Chest radiograph 02/26/2012 FINDINGS: There is mild cardiomegaly. There are bibasilar hazy opacities. There are also ill-defined parahilar opacities. There is no pneumothorax. No sizable pleural effusion. IMPRESSION: 1. Mild cardiomegaly with  bibasilar opacities, suspect pulmonary edema. Multifocal infection could have a similar appearance. 2. Ill-defined parahilar opacities. When the patient is clinically able, upright PA and lateral radiographs are recommended for further characterization to exclude hilar adenopathy. Electronically Signed   By: Deatra Robinson M.D.   On: 04/26/2016 03:37   Echo 04/27/16 Study Conclusions  - Left ventricle: The cavity size was mildly dilated. Wall   thickness was normal. Systolic function was severely reduced. The   estimated ejection fraction was in the range of 20% to 25%.   Diffuse hypokinesis. There was fusion of early and atrial   contributions to ventricular filling. Doppler parameters are   consistent with a reversible restrictive pattern, indicative of   decreased left ventricular diastolic compliance and/or increased   left atrial pressure (grade 3 diastolic dysfunction). - Mitral valve: There was mild to moderate regurgitation. - Pulmonary arteries: Systolic pressure was moderately increased.   PA peak pressure: 48 mm Hg  (S). - Pericardium, extracardiac: A trivial pericardial effusion was   identified.  IMPRESSION: Principal Problem:   Acute respiratory failure with hypoxia (HCC) Active Problems:   History of cocaine abuse   Community acquired pneumonia   Uncontrolled type 2 diabetes mellitus with hyperglycemia (HCC)   Cardiomyopathy- suspect NICM but etiology not yet determined   Acute combined systolic and diastolic heart failure (HCC)   History of major depression   RECOMMENDATION: He is on IV Lasix and has diuresed  950 cc. We will need to consider an ischemic work at some point up for LVD with a history of DM. His troponin have been negative. Coreg and ACE added.  Add ASA 81 mg, lipids ordered for am- will hold on statin until these results are back.  MD to see.  Time Spent Directly with Patient: 45 minutes  Corine Shelter, [place]  [phone] beeper 04/27/2016, 12:51 PM   As above, patient seen and examined. Briefly he is a 56 year old male with past medical history of diabetes mellitus, substance abuse for evaluation of acute systolic congestive heart failure. Patient states that he developed a cough 2 days ago. The cough increased with lying flat and improved with sitting up. He also noted orthopnea. He denies dyspnea on exertion prior to that and there has been no weight gain or increase in pedal edema. He denies chest pain. He denies fevers, chills or hemoptysis. He had low-grade fever on arrival to the emergency room. He has been treated with antibiotics. He also required transient BiPAP after being hydrated. Echocardiogram shows ejection fraction 20-25% and mild to moderate mitral regurgitation. Cardiology is now asked to evaluate. BNP on admission to 36. Troponins normal. Electrocardiogram showed sinus rhythm, PVC, left ventricular hypertrophy and nonspecific ST changes.  1 acute systolic congestive heart failure-patient's symptoms seem most consistent with CHF. I agree with continued diuresis and  follow renal function.  2 cardiomyopathy-etiology unclear. Continue ACE inhibitor and beta blocker. Titrate as tolerated. He will eventually need cardiac catheterization one CHF improves. Agree with aspirin and statin.  3 hypertension-blood pressure mildly elevated. Titrate medications as needed.  4 diabetes mellitus-management per primary care.  5 History of substance abuse-patient denies since October.   6 Question pneumonia-antibiotics per primary care.   Olga Millers, MD

## 2016-04-27 NOTE — Progress Notes (Signed)
  Echocardiogram 2D Echocardiogram has been performed.  Leta Jungling M 04/27/2016, 8:05 AM

## 2016-04-28 LAB — BASIC METABOLIC PANEL
ANION GAP: 8 (ref 5–15)
BUN: 18 mg/dL (ref 6–20)
CALCIUM: 8.5 mg/dL — AB (ref 8.9–10.3)
CO2: 26 mmol/L (ref 22–32)
Chloride: 99 mmol/L — ABNORMAL LOW (ref 101–111)
Creatinine, Ser: 0.69 mg/dL (ref 0.61–1.24)
GFR calc Af Amer: >60 mL/min (ref >60)
GFR calc non Af Amer: >60 mL/min (ref >60)
GLUCOSE: 276 mg/dL — AB (ref 65–99)
POTASSIUM: 4 mmol/L (ref 3.5–5.1)
SODIUM: 133 mmol/L — AB (ref 135–145)

## 2016-04-28 LAB — GLUCOSE, CAPILLARY
GLUCOSE-CAPILLARY: 265 mg/dL — AB (ref 65–99)
GLUCOSE-CAPILLARY: 245 mg/dL — AB (ref 65–99)
Glucose-Capillary: 273 mg/dL — ABNORMAL HIGH (ref 65–99)
Glucose-Capillary: 74 mg/dL (ref 65–99)

## 2016-04-28 LAB — LIPID PANEL
Cholesterol: 146 mg/dL (ref 0–200)
HDL: 61 mg/dL (ref >40)
LDL Cholesterol: 71 mg/dL (ref 0–99)
TRIGLYCERIDES: 71 mg/dL (ref <150)
Total CHOL/HDL Ratio: 2.4 RATIO
VLDL: 14 mg/dL (ref 0–40)

## 2016-04-28 LAB — HEMOGLOBIN A1C
HEMOGLOBIN A1C: 13.2 % — AB (ref 4.8–5.6)
Mean Plasma Glucose: 332 mg/dL

## 2016-04-28 MED ORDER — AZITHROMYCIN 250 MG PO TABS
500.0000 mg | ORAL_TABLET | ORAL | Status: DC
  Administered 2016-04-29 – 2016-05-01 (×3): 500 mg via ORAL
  Filled 2016-04-28 (×3): qty 2

## 2016-04-28 MED ORDER — INSULIN ASPART 100 UNIT/ML ~~LOC~~ SOLN: 0.0000 [IU] | Freq: Every day | SUBCUTANEOUS | Status: DC

## 2016-04-28 MED ORDER — INSULIN GLARGINE 100 UNIT/ML ~~LOC~~ SOLN
10.0000 [IU] | Freq: Once | SUBCUTANEOUS | Status: AC
  Administered 2016-04-28: 10 [IU] via SUBCUTANEOUS
  Filled 2016-04-28: qty 0.1

## 2016-04-28 MED ORDER — INSULIN ASPART 100 UNIT/ML ~~LOC~~ SOLN: 0.0000 [IU] | Freq: Three times a day (TID) | SUBCUTANEOUS | Status: DC

## 2016-04-28 MED ORDER — INSULIN GLARGINE 100 UNIT/ML ~~LOC~~ SOLN
15.0000 [IU] | Freq: Every day | SUBCUTANEOUS | Status: DC
  Administered 2016-04-30 – 2016-05-01 (×2): 15 [IU] via SUBCUTANEOUS
  Filled 2016-04-28 (×6): qty 0.15

## 2016-04-28 MED ORDER — INSULIN ASPART 100 UNIT/ML ~~LOC~~ SOLN
3.0000 [IU] | Freq: Three times a day (TID) | SUBCUTANEOUS | Status: DC
  Administered 2016-04-28 (×3): 3 [IU] via SUBCUTANEOUS

## 2016-04-28 NOTE — Progress Notes (Addendum)
Patient Name: James [name] Date of Encounter: 04/28/2016  Primary Cardiologist: Dr [facility] Problem List     Principal Problem:   Acute respiratory failure with hypoxia [facility]) Active Problems:   History of cocaine abuse   Community acquired pneumonia   Uncontrolled type 2 diabetes mellitus with hyperglycemia (HCC)   Cardiomyopathy- suspect NICM but etiology not yet determined   Acute combined systolic and diastolic heart failure (HCC)   History of major depression     Subjective   Looks and feels much better today- less SOB  Inpatient Medications    Scheduled Meds: . aspirin  81 mg Oral Daily  . atorvastatin  40 mg Oral q1800  . azithromycin  500 mg Intravenous Q24H  . carvedilol  12.5 mg Oral BID WC  . cefTRIAXone (ROCEPHIN)  IV  1 g Intravenous Q24H  . dextromethorphan-guaiFENesin  1 tablet Oral BID  . enoxaparin (LOVENOX) injection  40 mg Subcutaneous Q24H  . furosemide  20 mg Intravenous BID  . insulin aspart  0-15 Units Subcutaneous TID WC  . insulin aspart  0-5 Units Subcutaneous QHS  . insulin aspart  3 Units Subcutaneous TID WC  . insulin glargine  15 Units Subcutaneous QHS  . lisinopril  20 mg Oral Daily  . mouth rinse  15 mL Mouth Rinse BID   Continuous Infusions:  PRN Meds: acetaminophen **OR** acetaminophen, hydrALAZINE, ondansetron **OR** ondansetron (ZOFRAN) IV   Vital Signs    Vitals:   04/28/16 0344 04/28/16 0400 04/28/16 0500 04/28/16 0800  BP:    (!) 128/96  Pulse:      Resp:  (!) 22  (!) 32  Temp: 97.9 F (36.6 C)   98.5 F (36.9 C)  TempSrc: Oral   Oral  SpO2:  99%  94%  Weight:   153 lb 7 oz (69.6 kg)   Height:        Intake/Output Summary (Last 24 hours) at 04/28/16 1033 Last data filed at 04/28/16 0900  Gross per 24 hour  Intake             1210 ml  Output             2675 ml  Net            -1465 ml   Filed Weights   04/26/16 0600 04/27/16 0500 04/28/16 0500  Weight: 157 lb 6.5 oz (71.4 kg) 161 lb 9.6 oz  (73.3 kg) 153 lb 7 oz (69.6 kg)    Physical Exam    GEN: Thin AA male up in chair in no acute distress.  HEENT: Grossly normal.  Neck: Supple, no JVD, carotid bruits, or masses. Cardiac: RRR, no murmurs, rubs, or gallops. No clubbing, cyanosis, edema.  Radials/DP/PT 2+ and equal bilaterally.  Respiratory:  Respirations regular and unlabored, decreased breath sounds bilaterally, no rales. GI: Soft, nontender, nondistended, BS + x 4. MS: no deformity or atrophy. Skin: warm and dry, no rash. Neuro:  Strength and sensation are intact. Psych: AAOx3.  Normal affect.  Labs    CBC  Recent Labs  04/26/16 0300 04/26/16 0554  WBC 7.8 9.8  NEUTROABS 6.2 9.4*  HGB 12.8* 12.2*  HCT 37.6* 36.3*  MCV 83.9 85.0  PLT 242 251   Basic Metabolic Panel  Recent Labs  04/27/16 0332 04/28/16 0338  NA 135 133*  K 4.1 4.0  CL 101 99*  CO2 26 26  GLUCOSE 153* 276*  BUN 19 18  CREATININE 0.94 0.69  CALCIUM 8.7* 8.5*   Liver Function Tests  Recent Labs  04/26/16 0300 04/26/16 0554  AST 28 25  ALT 47 44  ALKPHOS 73 67  BILITOT 0.5 0.5  PROT 6.6 6.5  ALBUMIN 3.5 3.2*   No results for input(s): LIPASE, AMYLASE in the last 72 hours. Cardiac Enzymes  Recent Labs  04/26/16 0554 04/26/16 1150 04/26/16 1643  TROPONINI 0.03* <0.03 <0.03   BNP Invalid input(s): POCBNP D-Dimer  Recent Labs  04/26/16 0820  DDIMER 0.33   Hemoglobin A1C  Recent Labs  04/27/16 0332  HGBA1C 13.2*   Fasting Lipid Panel  Recent Labs  04/28/16 0338  CHOL 146  HDL 61  LDLCALC 71  TRIG 71  CHOLHDL 2.4    Telemetry    NSR, ST, PVCs, NSVT runs- 4-8 beats - Personally Reviewed  ECG    NSR, ST, LVH, PVCs - Personally Reviewed  Radiology    CXR 04/26/16 CHEST  2 VIEW  COMPARISON:  PA and lateral chest 02/26/2012. Single-view of the chest 04/26/2016.  FINDINGS: Patchy bilateral airspace disease persists. Aeration appears mildly improved in the right lung base. There is  cardiomegaly. No pneumothorax. Small bilateral pleural effusions are seen.  IMPRESSION: Bilateral airspace disease shows some improvement in the right lung base compatible with resolving pneumonia and/or edema.  Small bilateral pleural effusions.  Cardiomegaly.     Cardiac Studies   Echo 04/27/16 Study Conclusions  - Left ventricle: The cavity size was mildly dilated. Wall   thickness was normal. Systolic function was severely reduced. The   estimated ejection fraction was in the range of 20% to 25%.   Diffuse hypokinesis. There was fusion of early and atrial   contributions to ventricular filling. Doppler parameters are   consistent with a reversible restrictive pattern, indicative of   decreased left ventricular diastolic compliance and/or increased   left atrial pressure (grade 3 diastolic dysfunction). - Mitral valve: There was mild to moderate regurgitation. - Pulmonary arteries: Systolic pressure was moderately increased.   PA peak pressure: 48 mm Hg (S). - Pericardium, extracardiac: A trivial pericardial effusion was   identified.   Patient Profile     56 y.o.AA malewith history of DM type 2 and previous history of drug abuse (drug screen negative this adm),  presented to the ER 04/26/16 with chest tightness, coughing, and shortness of breath for about one week.. Echo showed an of EF20-25% with grade 3 DD and mild to moderate MR, cardiology consulted. The pt denied any history of chest pain. He improved with diuresis.    Assessment & Plan    1 acute systolic congestive heart failure-patient's symptoms seem most consistent with CHF. Agree with continued diuresis and follow renal function.  2 Systolic cardiomyopathy-etiology unclear. Continue ACE inhibitor and beta blocker. Titrate as tolerated. He will eventually need cardiac catheterization one CHF improves. Agree with aspirin and statin.  3 HTN-blood pressure mildly elevated. Titrate medications as  needed.  4 DM-management per primary care.  5 History of substance abuse-patient denies since October. Drug screen negative for cocaine  6 Question pneumonia-antibiotics per primary care.    Signed, Corine Shelter, PA-C  04/28/2016, 10:33 AM   As above, patient seen and examined. His cough is improving. Patient with probable combination URI and CHF. Continue Lasix at present dose. Continue lisinopril and carvedilol. We will plan cardiac catheterization given severely reduced LV function; probably Friday. Olga Millers, MD

## 2016-04-28 NOTE — Progress Notes (Signed)
PROGRESS NOTE    James [name]  TMA:[id number] DOB: 03/28/1961 DOA: 04/26/2016 PCP: Jacklynn Barnacle, NP     Brief Narrative:  James [name] a 56 y.o.malewith history of diabetes mellitus type 2 and previous history of drug abuse, as per the patient has not had any cocaine for last 3-4 months presents to the ER because of persistent chest tightness shortness of breath and productive cough over the last 2 days. Patient also has been a subjective feeling of fever chills. In the ER chest x-ray shows cardiomegaly with possible congestion and patient was mildly febrile. Lactate was elevated. Patient's shortness of breath is only on coughing. In the ER patient was given fluid bolus. Influenza PCR was negative. Patient was started on empiric antibiotics for pneumonia after blood cultures were obtained. Patient became progressively short of breath for which patient was placed on BiPAP. Has been weaned to [facility]. Echo showed EF 20-25%, cardiology consulted.   Assessment & Plan:   Principal Problem:   Acute respiratory failure with hypoxia (HCC) Active Problems:   History of cocaine abuse   Community acquired pneumonia   Uncontrolled type 2 diabetes mellitus with hyperglycemia (HCC)   Cardiomyopathy- suspect NICM but etiology not yet determined   Acute combined systolic and diastolic heart failure (HCC)   History of major depression   Acute respiratory failure with hypoxia likely secondary to ?pneumonia and CHF exacerbation  -Patient was placed on BiPAP.  Needed nonrebreather in the ER. Now on room air.  -DDimer normal  CAP -Chest x-ray: Bilateral airspace disease showing some improvement in the right lung base compatible with resolving pneumonia and/or edema, small bilateral pleural effusion -Strep pneumonia urine antigen negative. Legionella urine antigen pending  -Blood cultures pending  -Sputum culture pending  -Influenza PCR negative -Wean antibiotics to oral today   New combined  systolic and diastolic CHF -BNP on admission 236 -Echocardiogram EF 20-25%, Grade 3 diastolic dysfunction -Coreg and ACEi -Cardiology consulted and appreciated -Lasix 20mg  IV BID -Monitor intake/output, daily weights  Diabetes mellitus, type Ii, uncontrolled  -Metformin held -Hemoglobin A1c 13.2  -Continue lantus, ISS, and CBG monitoring. Increase insulin and add mealtime, nighttime coverage   History of drug abuse -Drug screen negative  Tobacco abuse -Counseled on smoking cessation   DVT prophylaxis: lovenox Code Status: Full Family Communication: no family at bedside Disposition Plan: pending further improvement. Transfer out of step down to tele today.    Consultants:   Cardiology  Procedures:   None  Antimicrobials:  Anti-infectives    Start     Dose/Rate Route Frequency Ordered Stop   04/27/16 0400  cefTRIAXone (ROCEPHIN) 1 g in dextrose 5 % 50 mL IVPB     1 g 100 mL/hr over 30 Minutes Intravenous Every 24 hours 04/26/16 0514 05/04/16 0359   04/26/16 1000  doxycycline (VIBRA-TABS) tablet 100 mg  Status:  Discontinued     100 mg Oral Every 12 hours 04/26/16 0359 04/26/16 0514   04/26/16 0600  azithromycin (ZITHROMAX) 500 mg in dextrose 5 % 250 mL IVPB     500 mg 250 mL/hr over 60 Minutes Intravenous Every 24 hours 04/26/16 0514 05/03/16 0559   04/26/16 0400  cefTRIAXone (ROCEPHIN) 1 g in dextrose 5 % 50 mL IVPB     1 g 100 mL/hr over 30 Minutes Intravenous  Once 04/26/16 0359 04/26/16 0438      Subjective: Patient states that he is doing much better today. His breathing is much improved and currently on room  air. Afebrile overnight, cough is improved as well. No chest pains. Eyes any nausea, vomiting, diarrhea or abdominal pain. Tolerating meals. Ambulating around the room without issue  Objective: Vitals:   04/28/16 0000 04/28/16 0344 04/28/16 0400 04/28/16 0500  BP: 117/73     Pulse:      Resp: (!) 21  (!) 22   Temp:  97.9 F (36.6 C)      TempSrc:  Oral    SpO2: 96%  99%   Weight:    69.6 kg (153 lb 7 oz)  Height:        Intake/Output Summary (Last 24 hours) at 04/28/16 0717 Last data filed at 04/28/16 4098  Gross per 24 hour  Intake              730 ml  Output             2150 ml  Net            -1420 ml   Filed Weights   04/26/16 0600 04/27/16 0500 04/28/16 0500  Weight: 71.4 kg (157 lb 6.5 oz) 73.3 kg (161 lb 9.6 oz) 69.6 kg (153 lb 7 oz)    Examination:  General exam: Appears calm and comfortable  Respiratory system: Clear to auscultation. Respiratory effort normal. On room air Cardiovascular system: S1 & S2 heard, RRR. No JVD, murmurs, rubs, gallops or clicks. No pedal edema. Gastrointestinal system: Abdomen is nondistended, soft and nontender. No organomegaly or masses felt. Normal bowel sounds heard. Central nervous system: Alert and oriented. No focal neurological deficits. Extremities: Symmetric 5 x 5 power. Skin: No rashes, lesions or ulcers Psychiatry: Judgement and insight appear normal. Mood & affect appropriate.   Data Reviewed: I have personally reviewed following labs and imaging studies  CBC:  Recent Labs Lab 04/26/16 0300 04/26/16 0554  WBC 7.8 9.8  NEUTROABS 6.2 9.4*  HGB 12.8* 12.2*  HCT 37.6* 36.3*  MCV 83.9 85.0  PLT 242 251   Basic Metabolic Panel:  Recent Labs Lab 04/26/16 0300 04/26/16 0554 04/27/16 0332 04/28/16 0338  NA 137 136 135 133*  K 3.2* 3.7 4.1 4.0  CL 100* 100* 101 99*  CO2 25 25 26 26   GLUCOSE 313* 329* 153* 276*  BUN 13 11 19 18   CREATININE 0.98 0.87 0.94 0.69  CALCIUM 8.7* 8.2* 8.7* 8.5*   GFR: Estimated Creatinine Clearance: 100.3 mL/min (by C-G formula based on SCr of 0.69 mg/dL). Liver Function Tests:  Recent Labs Lab 04/26/16 0300 04/26/16 0554  AST 28 25  ALT 47 44  ALKPHOS 73 67  BILITOT 0.5 0.5  PROT 6.6 6.5  ALBUMIN 3.5 3.2*   No results for input(s): LIPASE, AMYLASE in the last 168 hours. No results for input(s): AMMONIA in  the last 168 hours. Coagulation Profile: No results for input(s): INR, PROTIME in the last 168 hours. Cardiac Enzymes:  Recent Labs Lab 04/26/16 0300 04/26/16 0554 04/26/16 1150 04/26/16 1643  TROPONINI <0.03 0.03* <0.03 <0.03   BNP (last 3 results) No results for input(s): PROBNP in the last 8760 hours. HbA1C:  Recent Labs  04/27/16 0332  HGBA1C 13.2*   CBG:  Recent Labs Lab 04/26/16 2344 04/27/16 0759 04/27/16 1139 04/27/16 1551 04/27/16 2120  GLUCAP 157* 182* 221* 174* 295*   Lipid Profile: No results for input(s): CHOL, HDL, LDLCALC, TRIG, CHOLHDL, LDLDIRECT in the last 72 hours. Thyroid Function Tests: No results for input(s): TSH, T4TOTAL, FREET4, T3FREE, THYROIDAB in the last 72 hours. Anemia Panel:  No results for input(s): VITAMINB12, FOLATE, FERRITIN, TIBC, IRON, RETICCTPCT in the last 72 hours. Sepsis Labs:  Recent Labs Lab 04/26/16 0318 04/26/16 0554 04/26/16 0820 04/27/16 0835  LATICACIDVEN 2.53* 1.6 2.2* 1.1    Recent Results (from the past 240 hour(s))  Blood Culture (routine x 2)     Status: None (Preliminary result)   Collection Time: 04/26/16  3:16 AM  Result Value Ref Range Status   Specimen Description BLOOD LEFT ANTECUBITAL  Final   Special Requests BOTTLES DRAWN AEROBIC AND ANAEROBIC  Final   Culture   Final    NO GROWTH 1 DAY Performed at [facility], [address], [place], [place] [address]    Report Status PENDING  Incomplete  Blood Culture (routine x 2)     Status: None (Preliminary result)   Collection Time: 04/26/16  3:34 AM  Result Value Ref Range Status   Specimen Description BLOOD RIGHT ANTECUBITAL  Final   Special Requests BOTTLES DRAWN AEROBIC AND ANAEROBIC 6CC  Final   Culture   Final    NO GROWTH 1 DAY Performed at [facility], [address], [place], [place] [address]    Report Status PENDING  Incomplete  Urine culture     Status: None   Collection Time: 04/26/16  4:09 AM  Result Value Ref  Range Status   Specimen Description URINE, CLEAN CATCH  Final   Special Requests NONE  Final   Culture NO GROWTH Performed at [facility]   Final   Report Status 04/27/2016 FINAL  Final  MRSA PCR Screening     Status: None   Collection Time: 04/26/16  6:36 AM  Result Value Ref Range Status   MRSA by PCR NEGATIVE NEGATIVE Final    Comment:        The GeneXpert MRSA Assay (FDA approved for NASAL specimens only), is one component of a comprehensive MRSA colonization surveillance program. It is not intended to diagnose MRSA infection nor to guide or monitor treatment for MRSA infections.        Radiology Studies: Dg Chest 2 View  Result Date: 04/26/2016 CLINICAL DATA:  Acute respiratory failure.  Shortness of breath EXAM: CHEST  2 VIEW COMPARISON:  PA and lateral chest 02/26/2012. Single-view of the chest 04/26/2016. FINDINGS: Patchy bilateral airspace disease persists. Aeration appears mildly improved in the right lung base. There is cardiomegaly. No pneumothorax. Small bilateral pleural effusions are seen. IMPRESSION: Bilateral airspace disease shows some improvement in the right lung base compatible with resolving pneumonia and/or edema. Small bilateral pleural effusions. Cardiomegaly. Electronically Signed   By: Drusilla Kanner M.D.   On: 04/26/2016 10:39      Scheduled Meds: . aspirin  81 mg Oral Daily  . atorvastatin  40 mg Oral q1800  . azithromycin  500 mg Intravenous Q24H  . carvedilol  12.5 mg Oral BID WC  . cefTRIAXone (ROCEPHIN)  IV  1 g Intravenous Q24H  . dextromethorphan-guaiFENesin  1 tablet Oral BID  . enoxaparin (LOVENOX) injection  40 mg Subcutaneous Q24H  . furosemide  20 mg Intravenous BID  . insulin aspart  0-15 Units Subcutaneous TID WC  . insulin glargine  12 Units Subcutaneous QHS  . lisinopril  20 mg Oral Daily  . mouth rinse  15 mL Mouth Rinse BID   Continuous Infusions:   LOS: 2 days    Time spent: 40 minutes   Noralee Stain,  DO [facility] [email or web address] Password [facility] 04/28/2016,  7:17 AM

## 2016-04-28 NOTE — Progress Notes (Signed)
PHARMACIST - PHYSICIAN COMMUNICATION CONCERNING: Antibiotic IV to Oral Route Change Policy  RECOMMENDATION: This patient is receiving Azithromycin by the intravenous route.  Based on criteria approved by the Pharmacy and Therapeutics Committee, the antibiotic(s) is/are being converted to the equivalent oral dose form(s).   DESCRIPTION: These criteria include:  Patient being treated for a respiratory tract infection, urinary tract infection, cellulitis or clostridium difficile associated diarrhea if on metronidazole  The patient is not neutropenic and does not exhibit a GI malabsorption state  The patient is eating (either orally or via tube) and/or has been taking other orally administered medications for a least 24 hours  The patient is improving clinically and has a Tmax < 100.5  If you have questions about this conversion, please contact the Pharmacy Department  []   [phone] )  [facility] []   [phone] )  [facility] []   [phone] )  [facility] []   [phone] )  [facility] [x]   [phone] )  [facility]   Clance Boll, PharmD, BCPS Pager: [phone] 04/28/2016 11:22 AM

## 2016-04-29 LAB — LEGIONELLA PNEUMOPHILA SEROGP 1 UR AG: L. PNEUMOPHILA SEROGP 1 UR AG: NEGATIVE

## 2016-04-29 LAB — GLUCOSE, CAPILLARY
GLUCOSE-CAPILLARY: 208 mg/dL — AB (ref 65–99)
Glucose-Capillary: 227 mg/dL — ABNORMAL HIGH (ref 65–99)
Glucose-Capillary: 272 mg/dL — ABNORMAL HIGH (ref 65–99)
Glucose-Capillary: 472 mg/dL — ABNORMAL HIGH (ref 65–99)
Glucose-Capillary: 117 mg/dL — ABNORMAL HIGH (ref 65–99)
Glucose-Capillary: 227 mg/dL — ABNORMAL HIGH (ref 65–99)

## 2016-04-29 LAB — BASIC METABOLIC PANEL
Anion gap: 9 (ref 5–15)
BUN: 20 mg/dL (ref 6–20)
CALCIUM: 8.6 mg/dL — AB (ref 8.9–10.3)
CHLORIDE: 98 mmol/L — AB (ref 101–111)
CO2: 26 mmol/L (ref 22–32)
CREATININE: 0.85 mg/dL (ref 0.61–1.24)
GFR calc Af Amer: >60 mL/min (ref >60)
GFR calc non Af Amer: >60 mL/min (ref >60)
Glucose, Bld: 199 mg/dL — ABNORMAL HIGH (ref 65–99)
Potassium: 3.8 mmol/L (ref 3.5–5.1)
SODIUM: 133 mmol/L — AB (ref 135–145)

## 2016-04-29 LAB — VITAMIN D 25 HYDROXY (VIT D DEFICIENCY, FRACTURES): Vit D, 25-Hydroxy: 26.4 ng/mL — ABNORMAL LOW (ref 30.0–100.0)

## 2016-04-29 MED ORDER — LISINOPRIL 10 MG PO TABS
10.0000 mg | ORAL_TABLET | Freq: Every day | ORAL | Status: DC
  Administered 2016-04-29 – 2016-05-02 (×4): 10 mg via ORAL
  Filled 2016-04-29 (×3): qty 1

## 2016-04-29 MED ORDER — INSULIN GLARGINE 100 UNIT/ML ~~LOC~~ SOLN
7.0000 [IU] | Freq: Once | SUBCUTANEOUS | Status: AC
  Administered 2016-04-29: 7 [IU] via SUBCUTANEOUS
  Filled 2016-04-29: qty 0.07

## 2016-04-29 MED ORDER — INSULIN ASPART 100 UNIT/ML ~~LOC~~ SOLN
0.0000 [IU] | Freq: Three times a day (TID) | SUBCUTANEOUS | Status: DC
  Administered 2016-04-29 – 2016-05-01 (×3): 9 [IU] via SUBCUTANEOUS
  Administered 2016-05-01: 3 [IU] via SUBCUTANEOUS
  Administered 2016-05-01 – 2016-05-02 (×2): 7 [IU] via SUBCUTANEOUS
  Administered 2016-05-02: 3 [IU] via SUBCUTANEOUS

## 2016-04-29 MED ORDER — INSULIN ASPART 100 UNIT/ML ~~LOC~~ SOLN
0.0000 [IU] | Freq: Every day | SUBCUTANEOUS | Status: DC
  Administered 2016-04-29: 2 [IU] via SUBCUTANEOUS
  Administered 2016-05-01: 3 [IU] via SUBCUTANEOUS

## 2016-04-29 MED ORDER — INSULIN ASPART 100 UNIT/ML ~~LOC~~ SOLN: 3.0000 [IU] | Freq: Three times a day (TID) | SUBCUTANEOUS | Status: DC

## 2016-04-29 MED ORDER — INSULIN ASPART 100 UNIT/ML ~~LOC~~ SOLN: 0.0000 [IU] | Freq: Every day | SUBCUTANEOUS | Status: DC

## 2016-04-29 MED ORDER — INSULIN ASPART 100 UNIT/ML ~~LOC~~ SOLN
3.0000 [IU] | Freq: Three times a day (TID) | SUBCUTANEOUS | Status: DC
  Administered 2016-04-29 – 2016-05-02 (×7): 3 [IU] via SUBCUTANEOUS

## 2016-04-29 MED ORDER — VITAMIN D (ERGOCALCIFEROL) 1.25 MG (50000 UNIT) PO CAPS
50000.0000 [IU] | ORAL_CAPSULE | Freq: Once | ORAL | Status: AC
  Administered 2016-04-29: 50000 [IU] via ORAL
  Filled 2016-04-29: qty 1

## 2016-04-29 MED ORDER — INSULIN ASPART 100 UNIT/ML ~~LOC~~ SOLN: 0.0000 [IU] | Freq: Three times a day (TID) | SUBCUTANEOUS | Status: DC

## 2016-04-29 NOTE — Progress Notes (Addendum)
PROGRESS NOTE    James [name]  EAV:[id number] DOB: March 22, 1961 DOA: 04/26/2016 PCP: Jacklynn Barnacle, NP     Brief Narrative:  James [name] a 56 y.o.malewith history of diabetes mellitus type 2 and previous history of drug abuse, as per the patient has not had any cocaine for last 3-4 months presents to the ER because of persistent chest tightness shortness of breath and productive cough over the last 2 days. Patient also has been a subjective feeling of fever chills. In the ER chest x-ray shows cardiomegaly with possible congestion and patient was mildly febrile. Lactate was elevated. Patient's shortness of breath is only on coughing. In the ER patient was given fluid bolus. Influenza PCR was negative. Patient was started on empiric antibiotics for pneumonia after blood cultures were obtained. Patient became progressively short of breath for which patient was placed on BiPAP. Has been weaned to [facility] Echo showed EF 20-25%, cardiology consulted.   Assessment & Plan:   Principal Problem:   Acute respiratory failure with hypoxia (HCC) Active Problems:   History of cocaine abuse   Community acquired pneumonia   Uncontrolled type 2 diabetes mellitus with hyperglycemia (HCC)   Cardiomyopathy- suspect NICM but etiology not yet determined   Acute combined systolic and diastolic heart failure (HCC)   History of major depression   Acute respiratory failure with hypoxia likely secondary to ?pneumonia and CHF exacerbation  -Patient was placed on BiPAP initially.  Needed nonrebreather in the ER. Now on room air.  -DDimer normal   CAP -Chest x-ray: Bilateral airspace disease showing some improvement in the right lung base compatible with resolving pneumonia and/or edema, small bilateral pleural effusion -Strep pneumonia urine antigen negative. Legionella urine antigen pending  -Blood cultures NGTD  -Sputum culture pending  -Influenza PCR negative -Finish azithromycin   New combined  systolic and diastolic CHF -BNP on admission 236 -Echocardiogram EF 20-25%, Grade 3 diastolic dysfunction -Coreg and ACEi -Cardiology consulted and appreciated -Lasix 20mg  IV BID -Monitor intake/output, daily weights -Plan for heart cath tomorrow 1/19   Diabetes mellitus, type Ii, uncontrolled  -Metformin held -Hemoglobin A1c 13.2  -Continue lantus, ISS, and CBG monitoring. Increase insulin and add mealtime, nighttime coverage. Dose adjusted today due to glucose 78 last evening.   History of drug abuse -Drug screen negative  Tobacco abuse -Counseled on smoking cessation   DVT prophylaxis: lovenox Code Status: Full Family Communication: spoke with sister over the phone  Disposition Plan: pending heart cath 1/19    Consultants:   Cardiology  Procedures:   None  Antimicrobials:  Anti-infectives    Start     Dose/Rate Route Frequency Ordered Stop   04/29/16 0600  azithromycin (ZITHROMAX) tablet 500 mg     500 mg Oral Every 24 hours 04/28/16 1123 05/03/16 0559   04/27/16 0400  cefTRIAXone (ROCEPHIN) 1 g in dextrose 5 % 50 mL IVPB  Status:  Discontinued     1 g 100 mL/hr over 30 Minutes Intravenous Every 24 hours 04/26/16 0514 04/28/16 1154   04/26/16 1000  doxycycline (VIBRA-TABS) tablet 100 mg  Status:  Discontinued     100 mg Oral Every 12 hours 04/26/16 0359 04/26/16 0514   04/26/16 0600  azithromycin (ZITHROMAX) 500 mg in dextrose 5 % 250 mL IVPB  Status:  Discontinued     500 mg 250 mL/hr over 60 Minutes Intravenous Every 24 hours 04/26/16 0514 04/28/16 1123   04/26/16 0400  cefTRIAXone (ROCEPHIN) 1 g in dextrose 5 % 50 mL  IVPB     1 g 100 mL/hr over 30 Minutes Intravenous  Once 04/26/16 0359 04/26/16 0438      Subjective: Patient states that he is doing much better today. His breathing is much improved and currently on room air. No acute issues   Objective: Vitals:   04/28/16 1925 04/28/16 2000 04/29/16 0500 04/29/16 0724  BP:  (!) 100/58 100/62 (!)  107/57  Pulse:      Resp:  (!) 24 (!) 25 (!) 25  Temp: 98.3 F (36.8 C)     TempSrc: Oral     SpO2:  98% 97% 98%  Weight:   67.3 kg (148 lb 5.9 oz)   Height:        Intake/Output Summary (Last 24 hours) at 04/29/16 0844 Last data filed at 04/29/16 0600  Gross per 24 hour  Intake             1280 ml  Output             1975 ml  Net             -695 ml   Filed Weights   04/27/16 0500 04/28/16 0500 04/29/16 0500  Weight: 73.3 kg (161 lb 9.6 oz) 69.6 kg (153 lb 7 oz) 67.3 kg (148 lb 5.9 oz)    Examination:  General exam: Appears calm and comfortable  Respiratory system: Clear to auscultation. Respiratory effort normal. On room air Cardiovascular system: S1 & S2 heard, RRR. No JVD, murmurs, rubs, gallops or clicks. No pedal edema. Gastrointestinal system: Abdomen is nondistended, soft and nontender. No organomegaly or masses felt. Normal bowel sounds heard. Central nervous system: Alert and oriented. No focal neurological deficits. Extremities: Symmetric 5 x 5 power. Skin: No rashes, lesions or ulcers Psychiatry: Judgement and insight appear normal. Mood & affect appropriate.   Data Reviewed: I have personally reviewed following labs and imaging studies  CBC:  Recent Labs Lab 04/26/16 0300 04/26/16 0554  WBC 7.8 9.8  NEUTROABS 6.2 9.4*  HGB 12.8* 12.2*  HCT 37.6* 36.3*  MCV 83.9 85.0  PLT 242 251   Basic Metabolic Panel:  Recent Labs Lab 04/26/16 0300 04/26/16 0554 04/27/16 0332 04/28/16 0338 04/29/16 0344  NA 137 136 135 133* 133*  K 3.2* 3.7 4.1 4.0 3.8  CL 100* 100* 101 99* 98*  CO2 25 25 26 26 26   GLUCOSE 313* 329* 153* 276* 199*  BUN 13 11 19 18 20   CREATININE 0.98 0.87 0.94 0.69 0.85  CALCIUM 8.7* 8.2* 8.7* 8.5* 8.6*   GFR: Estimated Creatinine Clearance: 91.3 mL/min (by C-G formula based on SCr of 0.85 mg/dL). Liver Function Tests:  Recent Labs Lab 04/26/16 0300 04/26/16 0554  AST 28 25  ALT 47 44  ALKPHOS 73 67  BILITOT 0.5 0.5  PROT  6.6 6.5  ALBUMIN 3.5 3.2*   No results for input(s): LIPASE, AMYLASE in the last 168 hours. No results for input(s): AMMONIA in the last 168 hours. Coagulation Profile: No results for input(s): INR, PROTIME in the last 168 hours. Cardiac Enzymes:  Recent Labs Lab 04/26/16 0300 04/26/16 0554 04/26/16 1150 04/26/16 1643  TROPONINI <0.03 0.03* <0.03 <0.03   BNP (last 3 results) No results for input(s): PROBNP in the last 8760 hours. HbA1C:  Recent Labs  04/27/16 0332  HGBA1C 13.2*   CBG:  Recent Labs Lab 04/28/16 1204 04/28/16 1649 04/28/16 2129 04/29/16 0100 04/29/16 0728  GLUCAP 245* 273* 74 208* 227*   Lipid Profile:  Recent Labs  04/28/16 0338  CHOL 146  HDL 61  LDLCALC 71  TRIG 71  CHOLHDL 2.4   Thyroid Function Tests: No results for input(s): TSH, T4TOTAL, FREET4, T3FREE, THYROIDAB in the last 72 hours. Anemia Panel: No results for input(s): VITAMINB12, FOLATE, FERRITIN, TIBC, IRON, RETICCTPCT in the last 72 hours. Sepsis Labs:  Recent Labs Lab 04/26/16 0318 04/26/16 0554 04/26/16 0820 04/27/16 0835  LATICACIDVEN 2.53* 1.6 2.2* 1.1    Recent Results (from the past 240 hour(s))  Blood Culture (routine x 2)     Status: None (Preliminary result)   Collection Time: 04/26/16  3:16 AM  Result Value Ref Range Status   Specimen Description BLOOD LEFT ANTECUBITAL  Final   Special Requests BOTTLES DRAWN AEROBIC AND ANAEROBIC  Final   Culture   Final    NO GROWTH 2 DAYS Performed at [facility], [address] [place], [place] [address]    Report Status PENDING  Incomplete  Blood Culture (routine x 2)     Status: None (Preliminary result)   Collection Time: 04/26/16  3:34 AM  Result Value Ref Range Status   Specimen Description BLOOD RIGHT ANTECUBITAL  Final   Special Requests BOTTLES DRAWN AEROBIC AND ANAEROBIC 6CC  Final   Culture   Final    NO GROWTH 2 DAYS Performed at [facility], [address], [place], [place]  [address]    Report Status PENDING  Incomplete  Urine culture     Status: None   Collection Time: 04/26/16  4:09 AM  Result Value Ref Range Status   Specimen Description URINE, CLEAN CATCH  Final   Special Requests NONE  Final   Culture NO GROWTH Performed at [facility]   Final   Report Status 04/27/2016 FINAL  Final  MRSA PCR Screening     Status: None   Collection Time: 04/26/16  6:36 AM  Result Value Ref Range Status   MRSA by PCR NEGATIVE NEGATIVE Final    Comment:        The GeneXpert MRSA Assay (FDA approved for NASAL specimens only), is one component of a comprehensive MRSA colonization surveillance program. It is not intended to diagnose MRSA infection nor to guide or monitor treatment for MRSA infections.        Radiology Studies: No results found.    Scheduled Meds: . aspirin  81 mg Oral Daily  . atorvastatin  40 mg Oral q1800  . azithromycin  500 mg Oral Q24H  . carvedilol  12.5 mg Oral BID WC  . dextromethorphan-guaiFENesin  1 tablet Oral BID  . enoxaparin (LOVENOX) injection  40 mg Subcutaneous Q24H  . furosemide  20 mg Intravenous BID  . insulin aspart  0-15 Units Subcutaneous TID WC  . insulin aspart  0-5 Units Subcutaneous QHS  . insulin glargine  15 Units Subcutaneous QHS  . lisinopril  10 mg Oral Daily  . mouth rinse  15 mL Mouth Rinse BID   Continuous Infusions:   LOS: 3 days    Time spent: 40 minutes   Noralee Stain, DO [facility] [email or web address] Password [facility] 04/29/2016, 8:44 AM

## 2016-04-29 NOTE — Progress Notes (Signed)
Inpatient Diabetes Program Recommendations  AACE/ADA: New Consensus Statement on Inpatient Glycemic Control (2015)  Target Ranges:  Prepandial:   less than 140 mg/dL      Peak postprandial:   less than 180 mg/dL (1-2 hours)      Critically ill patients:  140 - 180 mg/dL   Lab Results  Component Value Date   GLUCAP 272 (H) 04/29/2016   HGBA1C 13.2 (H) 04/27/2016   Results for PHYLLIP, ITZKOWITZ (MRN [id number]) as of 04/29/2016 13:17  Ref. Range 04/28/2016 16:49 04/28/2016 21:29 04/29/2016 01:00 04/29/2016 07:28 04/29/2016 11:38  Glucose-Capillary Latest Ref Range: 65 - 99 mg/dL 615 (H)  Novolog 11 units 74 208 (H) 227 (H) 272 (H)    Current orders for Inpatient glycemic control:     Novolog 0-15 units TIDAC and 0-5 units QHS    Lantus 15 units QHS  Inpatient Diabetes Program Recommendations:    Noted patient's sensitivity to moderate correction above (Novolog 11 units dropped CBG from 273 to 74).    Please consider Meal Coverage of Novolog 3 units TID with meals (hold if pt eats <50% of meal) and decreasing Correction Scale to Sensitive (Novolog 0-9 units TIDAC and 0-5 units QHS).  Thank you,  Kristine Linea, RN, MSN Diabetes Coordinator Inpatient Diabetes Program [phone] (Team Pager)

## 2016-04-29 NOTE — Progress Notes (Signed)
Patient Name: James [name] Date of Encounter: 04/29/2016  Primary Cardiologist: Dr [facility] Problem List     Principal Problem:   Acute respiratory failure with hypoxia [facility]) Active Problems:   History of cocaine abuse   Community acquired pneumonia   Uncontrolled type 2 diabetes mellitus with hyperglycemia (HCC)   Cardiomyopathy- suspect NICM but etiology not yet determined   Acute combined systolic and diastolic heart failure (HCC)   History of major depression     Subjective   Looks and feels much better today- less SOB  Inpatient Medications    Scheduled Meds: . aspirin  81 mg Oral Daily  . atorvastatin  40 mg Oral q1800  . azithromycin  500 mg Oral Q24H  . carvedilol  12.5 mg Oral BID WC  . dextromethorphan-guaiFENesin  1 tablet Oral BID  . enoxaparin (LOVENOX) injection  40 mg Subcutaneous Q24H  . furosemide  20 mg Intravenous BID  . insulin aspart  0-15 Units Subcutaneous TID WC  . insulin aspart  0-5 Units Subcutaneous QHS  . insulin glargine  15 Units Subcutaneous QHS  . lisinopril  20 mg Oral Daily  . mouth rinse  15 mL Mouth Rinse BID   Continuous Infusions:  PRN Meds: acetaminophen **OR** acetaminophen, hydrALAZINE, ondansetron **OR** ondansetron (ZOFRAN) IV   Vital Signs    Vitals:   04/28/16 1925 04/28/16 2000 04/29/16 0500 04/29/16 0724  BP:  (!) 100/58 100/62 (!) 107/57  Pulse:      Resp:  (!) 24 (!) 25 (!) 25  Temp: 98.3 F (36.8 C)     TempSrc: Oral     SpO2:  98% 97% 98%  Weight:   148 lb 5.9 oz (67.3 kg)   Height:        Intake/Output Summary (Last 24 hours) at 04/29/16 0754 Last data filed at 04/29/16 0600  Gross per 24 hour  Intake             1680 ml  Output             2375 ml  Net             -695 ml   Filed Weights   04/27/16 0500 04/28/16 0500 04/29/16 0500  Weight: 161 lb 9.6 oz (73.3 kg) 153 lb 7 oz (69.6 kg) 148 lb 5.9 oz (67.3 kg)    Physical Exam    GEN: Thin AA male up in chair in no acute  distress.  HEENT: Grossly normal.  Neck: Supple, no JVD, carotid bruits, or masses. Cardiac: RRR, no murmurs, rubs, or gallops. No clubbing, cyanosis, edema.  Radials/DP/PT 2+ and equal bilaterally.  Respiratory:  Respirations regular and unlabored, decreased breath sounds bilaterally, no rales. GI: Soft, nontender, nondistended, BS + x 4. MS: no deformity or atrophy. Skin: warm and dry, no rash. Neuro:  Strength and sensation are intact. Psych: AAOx3.  Normal affect.  Labs    CBC No results for input(s): WBC, NEUTROABS, HGB, HCT, MCV, PLT in the last 72 hours. Basic Metabolic Panel  Recent Labs  04/28/16 0338 04/29/16 0344  NA 133* 133*  K 4.0 3.8  CL 99* 98*  CO2 26 26  GLUCOSE 276* 199*  BUN 18 20  CREATININE 0.69 0.85  CALCIUM 8.5* 8.6*   Cardiac Enzymes  Recent Labs  04/26/16 1150 04/26/16 1643  TROPONINI <0.03 <0.03   BNP Invalid input(s): POCBNP D-Dimer  Recent Labs  04/26/16 0820  DDIMER 0.33   Hemoglobin A1C  Recent Labs  04/27/16 0332  HGBA1C 13.2*   Fasting Lipid Panel  Recent Labs  04/28/16 0338  CHOL 146  HDL 61  LDLCALC 71  TRIG 71  CHOLHDL 2.4    Telemetry    NSR, ST, PVCs, NSVT runs- 4-8 beats - Personally Reviewed  ECG    NSR, ST, LVH, PVCs - Personally Reviewed  Radiology    CXR 04/26/16 CHEST  2 VIEW  COMPARISON:  PA and lateral chest 02/26/2012. Single-view of the chest 04/26/2016.  FINDINGS: Patchy bilateral airspace disease persists. Aeration appears mildly improved in the right lung base. There is cardiomegaly. No pneumothorax. Small bilateral pleural effusions are seen.  IMPRESSION: Bilateral airspace disease shows some improvement in the right lung base compatible with resolving pneumonia and/or edema.  Small bilateral pleural effusions.  Cardiomegaly.     Cardiac Studies   Echo 04/27/16 Study Conclusions  - Left ventricle: The cavity size was mildly dilated. Wall   thickness was  normal. Systolic function was severely reduced. The   estimated ejection fraction was in the range of 20% to 25%.   Diffuse hypokinesis. There was fusion of early and atrial   contributions to ventricular filling. Doppler parameters are   consistent with a reversible restrictive pattern, indicative of   decreased left ventricular diastolic compliance and/or increased   left atrial pressure (grade 3 diastolic dysfunction). - Mitral valve: There was mild to moderate regurgitation. - Pulmonary arteries: Systolic pressure was moderately increased.   PA peak pressure: 48 mm Hg (S). - Pericardium, extracardiac: A trivial pericardial effusion was   identified.   Patient Profile     56 y.o.AA malewith history of DM type 2 and previous history of drug abuse (drug screen negative this adm),  presented to the ER 04/26/16 with chest tightness, coughing, and shortness of breath for about one week.. Echo showed an of EF20-25% with grade 3 DD and mild to moderate MR, cardiology consulted. The pt denied any history of chest pain. He improved with diuresis.    Assessment & Plan    1 acute systolic congestive heart failure-patient's symptoms seem most consistent with CHF. Agree with continued diuresis and follow renal function.  2 Systolic cardiomyopathy-etiology unclear. Continue ACE inhibitor and beta blocker. Titrate as tolerated. He will eventually need cardiac catheterization one CHF improves. Agree with aspirin and statin.  3 HTN-blood pressure mildly elevated on adm- now 100-110 systolic.   4 DM-Uncontrolled DM with HGb A1c 13, management per primary care.  5 History of substance abuse-patient denies since October. Drug screen negative for cocaine  6 Question pneumonia-antibiotics per primary care.   Plan: Not sure he will be able to tolerate Lisinopril 20 mg, Lasix 20 mg BID, and Coreg 12.5 mg BID secondary to low B/P. Will decrease Lisinopril to 10 mg. Plan on Rt and Lt cath Friday-  orders written, pt on the board for Dr Herbie Baltimore at 12 noon.   Signed, Corine Shelter, PA-C  04/29/2016, 7:54 AM  As above, patient seen and examined. His dyspnea has improved. His productive cough is also improving. No chest pain. Plan to continue lisinopril and carvedilol for cardiomyopathy. Hold Lasix prior to cardiac catheterization. Proceed with cardiac catheterization tomorrow to rule out coronary disease as a cause of his cardiomyopathy. The risks and benefits including myocardial infarction, CVA and death discussed and patient agrees to proceed. Possible pneumonia being managed by primary care. Patient can be transferred to telemetry from a cardiac standpoint.  Olga Millers, MD

## 2016-04-29 NOTE — Progress Notes (Signed)
Pt, wife and daughter watched education channel on cardiac cath and stents.

## 2016-04-30 ENCOUNTER — Inpatient Hospital Stay (HOSPITAL_COMMUNITY): Payer: Self-pay

## 2016-04-30 ENCOUNTER — Encounter (HOSPITAL_COMMUNITY)
Admission: EM | Disposition: A | Discharge status: Home / Self Care | Payer: Self-pay | Source: Home / Self Care | Attending: Internal Medicine

## 2016-04-30 DIAGNOSIS — I2510 Atherosclerotic heart disease of native coronary artery without angina pectoris: Secondary | ICD-10-CM

## 2016-04-30 HISTORY — PX: CARDIAC CATHETERIZATION: SHX172

## 2016-04-30 LAB — GLUCOSE, CAPILLARY
GLUCOSE-CAPILLARY: 233 mg/dL — AB (ref 65–99)
Glucose-Capillary: 193 mg/dL — ABNORMAL HIGH (ref 65–99)
Glucose-Capillary: 175 mg/dL — ABNORMAL HIGH (ref 65–99)
Glucose-Capillary: 461 mg/dL — ABNORMAL HIGH (ref 65–99)
Glucose-Capillary: 146 mg/dL — ABNORMAL HIGH (ref 65–99)

## 2016-04-30 LAB — BASIC METABOLIC PANEL
Anion gap: 7 (ref 5–15)
BUN: 19 mg/dL (ref 6–20)
CHLORIDE: 99 mmol/L — AB (ref 101–111)
CO2: 28 mmol/L (ref 22–32)
Calcium: 8.8 mg/dL — ABNORMAL LOW (ref 8.9–10.3)
Creatinine, Ser: 0.93 mg/dL (ref 0.61–1.24)
GFR calc Af Amer: >60 mL/min (ref >60)
GFR calc non Af Amer: >60 mL/min (ref >60)
GLUCOSE: 277 mg/dL — AB (ref 65–99)
POTASSIUM: 3.9 mmol/L (ref 3.5–5.1)
Sodium: 134 mmol/L — ABNORMAL LOW (ref 135–145)

## 2016-04-30 LAB — CBC WITH DIFFERENTIAL/PLATELET
BASOS ABS: 0 10*3/uL (ref 0.0–0.1)
BASOS PCT: 1 %
EOS PCT: 6 %
Eosinophils Absolute: 0.2 10*3/uL (ref 0.0–0.7)
HEMATOCRIT: 40.2 % (ref 39.0–52.0)
HEMOGLOBIN: 13.8 g/dL (ref 13.0–17.0)
LYMPHS ABS: 1.2 10*3/uL (ref 0.7–4.0)
LYMPHS PCT: 41 %
MCH: 28.9 pg (ref 26.0–34.0)
MCHC: 34.3 g/dL (ref 30.0–36.0)
MCV: 84.1 fL (ref 78.0–100.0)
MONOS PCT: 19 %
Monocytes Absolute: 0.6 10*3/uL (ref 0.1–1.0)
NEUTROS ABS: 1 10*3/uL — AB (ref 1.7–7.7)
Neutrophils Relative %: 33 %
Platelets: 310 10*3/uL (ref 150–400)
RBC: 4.78 MIL/uL (ref 4.22–5.81)
RDW: 13.3 % (ref 11.5–15.5)
WBC: 3 10*3/uL — ABNORMAL LOW (ref 4.0–10.5)

## 2016-04-30 LAB — POCT I-STAT 3, VENOUS BLOOD GAS (G3P V)
ACID-BASE EXCESS: 2 mmol/L (ref 0.0–2.0)
BICARBONATE: 27.5 mmol/L (ref 20.0–28.0)
O2 SAT: 63 %
PO2 VEN: 33 mmHg (ref 32.0–45.0)
TCO2: 29 mmol/L (ref 0–100)
pCO2, Ven: 45.1 mmHg (ref 44.0–60.0)
pH, Ven: 7.393 (ref 7.250–7.430)

## 2016-04-30 LAB — CBC
HCT: 37.1 % — ABNORMAL LOW (ref 39.0–52.0)
Hemoglobin: 12.7 g/dL — ABNORMAL LOW (ref 13.0–17.0)
MCH: 28.4 pg (ref 26.0–34.0)
MCHC: 34.2 g/dL (ref 30.0–36.0)
MCV: 83 fL (ref 78.0–100.0)
PLATELETS: 295 10*3/uL (ref 150–400)
RBC: 4.47 MIL/uL (ref 4.22–5.81)
RDW: 13.2 % (ref 11.5–15.5)
WBC: 2.6 10*3/uL — AB (ref 4.0–10.5)

## 2016-04-30 LAB — PATHOLOGIST SMEAR REVIEW

## 2016-04-30 LAB — PROTIME-INR
INR: 0.94
Prothrombin Time: 12.5 seconds (ref 11.4–15.2)

## 2016-04-30 LAB — POCT I-STAT 3, ART BLOOD GAS (G3+)
ACID-BASE EXCESS: 1 mmol/L (ref 0.0–2.0)
BICARBONATE: 25.7 mmol/L (ref 20.0–28.0)
O2 SAT: 94 %
PO2 ART: 70 mmHg — AB (ref 83.0–108.0)
TCO2: 27 mmol/L (ref 0–100)
pCO2 arterial: 39.1 mmHg (ref 32.0–48.0)
pH, Arterial: 7.426 (ref 7.350–7.450)

## 2016-04-30 LAB — CREATININE, SERUM
CREATININE: 0.87 mg/dL (ref 0.61–1.24)
GFR calc non Af Amer: >60 mL/min (ref >60)

## 2016-04-30 SURGERY — RIGHT/LEFT HEART CATH AND CORONARY ANGIOGRAPHY

## 2016-04-30 MED ORDER — VERAPAMIL HCL 2.5 MG/ML IV SOLN
INTRAVENOUS | Status: AC
  Filled 2016-04-30: qty 2

## 2016-04-30 MED ORDER — LIDOCAINE HCL (PF) 1 % IJ SOLN
INTRAMUSCULAR | Status: DC | PRN
  Administered 2016-04-30 (×2): 2 mL via INTRADERMAL

## 2016-04-30 MED ORDER — FENTANYL CITRATE (PF) 100 MCG/2ML IJ SOLN
INTRAMUSCULAR | Status: DC | PRN
  Administered 2016-04-30: 25 ug via INTRAVENOUS

## 2016-04-30 MED ORDER — SODIUM CHLORIDE 0.9 % IV SOLN
INTRAVENOUS | Status: DC | PRN
  Administered 2016-04-30: 50 mL/h via INTRAVENOUS

## 2016-04-30 MED ORDER — MIDAZOLAM HCL 2 MG/2ML IJ SOLN
INTRAMUSCULAR | Status: AC
  Filled 2016-04-30: qty 2

## 2016-04-30 MED ORDER — SODIUM CHLORIDE 0.9 % IV SOLN: INTRAVENOUS | Status: AC

## 2016-04-30 MED ORDER — HEPARIN (PORCINE) IN NACL 2-0.9 UNIT/ML-% IJ SOLN
INTRAMUSCULAR | Status: AC
  Filled 2016-04-30: qty 500

## 2016-04-30 MED ORDER — IOPAMIDOL (ISOVUE-370) INJECTION 76%
INTRAVENOUS | Status: AC
  Filled 2016-04-30: qty 100

## 2016-04-30 MED ORDER — VERAPAMIL HCL 2.5 MG/ML IV SOLN
INTRAVENOUS | Status: DC | PRN
  Administered 2016-04-30: 10 mL via INTRA_ARTERIAL

## 2016-04-30 MED ORDER — HEPARIN (PORCINE) IN NACL 2-0.9 UNIT/ML-% IJ SOLN
INTRAMUSCULAR | Status: DC | PRN
  Administered 2016-04-30: 1000 mL

## 2016-04-30 MED ORDER — SODIUM CHLORIDE 0.9% FLUSH
3.0000 mL | Freq: Two times a day (BID) | INTRAVENOUS | Status: DC
  Administered 2016-04-30 – 2016-05-01 (×3): 3 mL via INTRAVENOUS

## 2016-04-30 MED ORDER — FENTANYL CITRATE (PF) 100 MCG/2ML IJ SOLN
INTRAMUSCULAR | Status: AC
  Filled 2016-04-30: qty 2

## 2016-04-30 MED ORDER — SODIUM CHLORIDE 0.9% FLUSH: 3.0000 mL | INTRAVENOUS | Status: DC | PRN

## 2016-04-30 MED ORDER — SODIUM CHLORIDE 0.9 % IV SOLN: 250.0000 mL | INTRAVENOUS | Status: DC | PRN

## 2016-04-30 MED ORDER — LIDOCAINE HCL (PF) 1 % IJ SOLN
INTRAMUSCULAR | Status: AC
  Filled 2016-04-30: qty 30

## 2016-04-30 MED ORDER — SODIUM CHLORIDE 0.9 % IV SOLN
INTRAVENOUS | Status: DC
  Administered 2016-04-30 (×2): via INTRAVENOUS

## 2016-04-30 MED ORDER — HEPARIN SODIUM (PORCINE) 1000 UNIT/ML IJ SOLN
INTRAMUSCULAR | Status: AC
  Filled 2016-04-30: qty 1

## 2016-04-30 MED ORDER — IOPAMIDOL (ISOVUE-370) INJECTION 76%
INTRAVENOUS | Status: DC | PRN
  Administered 2016-04-30: 30 mL via INTRA_ARTERIAL

## 2016-04-30 MED ORDER — MIDAZOLAM HCL 2 MG/2ML IJ SOLN
INTRAMUSCULAR | Status: DC | PRN
  Administered 2016-04-30: 1 mg via INTRAVENOUS

## 2016-04-30 MED ORDER — HEPARIN SODIUM (PORCINE) 1000 UNIT/ML IJ SOLN
INTRAMUSCULAR | Status: DC | PRN
  Administered 2016-04-30: 3500 [IU] via INTRAVENOUS

## 2016-04-30 SURGICAL SUPPLY — 12 items
CATH 5FR JL3.5 JR4 ANG PIG MP (CATHETERS) ×2 IMPLANT
CATH BALLN WEDGE 5F 110CM (CATHETERS) ×1 IMPLANT
DEVICE RAD COMP TR BAND LRG (VASCULAR PRODUCTS) ×2 IMPLANT
GLIDESHEATH SLEND SS 6F .021 (SHEATH) ×2 IMPLANT
GUIDEWIRE INQWIRE 1.5J.035X260 (WIRE) IMPLANT
INQWIRE 1.5J .035X260CM (WIRE) ×2
KIT HEART LEFT (KITS) ×2 IMPLANT
PACK CARDIAC CATHETERIZATION (CUSTOM PROCEDURE TRAY) ×2 IMPLANT
SHEATH FAST CATH BRACH 5F 5CM (SHEATH) ×1 IMPLANT
SYR MEDRAD MARK V 150ML (SYRINGE) ×2 IMPLANT
TRANSDUCER W/STOPCOCK (MISCELLANEOUS) ×2 IMPLANT
TUBING CIL FLEX 10 FLL-RA (TUBING) ×2 IMPLANT

## 2016-04-30 NOTE — H&P (View-Only) (Signed)
Progress Note  Patient Name: James [name] Date of Encounter: 04/30/2016  Primary Cardiologist: Dr. Jens Som   Patient Profile     56 y.o.AA malewith history of DM type 2 and previous history of drug abuse (drug screen negative this adm), presentedto the ER 04/26/16 withchest tightness, coughing, and shortness of breath for about one week.. Echo showed an ofEF 20-25% with grade 3 DD and mild to moderate MR, cardiology consulted. The pt denied any history of chest pain. He improved with diuresis.   Subjective   Worsening productive cough, now getting repeat CXR.   Inpatient Medications    Scheduled Meds: . aspirin  81 mg Oral Daily  . atorvastatin  40 mg Oral q1800  . azithromycin  500 mg Oral Q24H  . carvedilol  12.5 mg Oral BID WC  . dextromethorphan-guaiFENesin  1 tablet Oral BID  . enoxaparin (LOVENOX) injection  40 mg Subcutaneous Q24H  . insulin aspart  0-5 Units Subcutaneous QHS  . insulin aspart  0-9 Units Subcutaneous TID WC  . insulin aspart  3 Units Subcutaneous TID WC  . insulin glargine  15 Units Subcutaneous QHS  . lisinopril  10 mg Oral Daily  . mouth rinse  15 mL Mouth Rinse BID   Continuous Infusions: . sodium chloride 50 mL/hr at 04/30/16 0833   PRN Meds: acetaminophen **OR** acetaminophen, hydrALAZINE, ondansetron **OR** ondansetron (ZOFRAN) IV   Vital Signs    Vitals:   04/29/16 2057 04/30/16 0546 04/30/16 0547 04/30/16 0823  BP: 102/62  113/77 111/72  Pulse: 89  87 92  Resp: 17  18 18   Temp: 97.7 F (36.5 C)  98.1 F (36.7 C) 97.5 F (36.4 C)  TempSrc: Oral  Oral Oral  SpO2: 98%  98% 97%  Weight:  147 lb 9.6 oz (67 kg)    Height:        Intake/Output Summary (Last 24 hours) at 04/30/16 0856 Last data filed at 04/29/16 1714  Gross per 24 hour  Intake              360 ml  Output                0 ml  Net              360 ml   Filed Weights   04/29/16 0500 04/29/16 0958 04/30/16 0546  Weight: 148 lb 5.9 oz (67.3 kg) 147 lb  (66.7 kg) 147 lb 9.6 oz (67 kg)    Telemetry    NSR 90s - Personally Reviewed   Physical Exam   GEN: No acute distress.  Neck: No JVD  Cardiac: RRR, no murmurs, rubs, or gallops.  Radials/DP/PT 2+ and equal bilaterally.  Respiratory:  Clear to auscultation bilaterally. GI: Soft, nontender, non-distended  MS: no deformity; no edema Neuro:  Alert and oriented x 3  Labs    Chemistry Recent Labs Lab 04/26/16 0300 04/26/16 0554  04/28/16 0338 04/29/16 0344 04/30/16 0518  NA 137 136  < > 133* 133* 134*  K 3.2* 3.7  < > 4.0 3.8 3.9  CL 100* 100*  < > 99* 98* 99*  CO2 25 25  < > 26 26 28   GLUCOSE 313* 329*  < > 276* 199* 277*  BUN 13 11  < > 18 20 19   CREATININE 0.98 0.87  < > 0.69 0.85 0.93  CALCIUM 8.7* 8.2*  < > 8.5* 8.6* 8.8*  PROT 6.6 6.5  --   --   --   --  ALBUMIN 3.5 3.2*  --   --   --   --   AST 28 25  --   --   --   --   ALT 47 44  --   --   --   --   ALKPHOS 73 67  --   --   --   --   BILITOT 0.5 0.5  --   --   --   --   GFRNONAA >60 >60  < > >60 >60 >60  GFRAA >60 >60  < > >60 >60 >60  ANIONGAP 12 11  < > 8 9 7   < > = values in this interval not displayed.   Hematology Recent Labs Lab 04/26/16 0300 04/26/16 0554 04/30/16 0518  WBC 7.8 9.8 3.0*  RBC 4.48 4.27 4.78  HGB 12.8* 12.2* 13.8  HCT 37.6* 36.3* 40.2  MCV 83.9 85.0 84.1  MCH 28.6 28.6 28.9  MCHC 34.0 33.6 34.3  RDW 13.5 13.8 13.3  PLT 242 251 310    Cardiac Enzymes Recent Labs Lab 04/26/16 0300 04/26/16 0554 04/26/16 1150 04/26/16 1643  TROPONINI <0.03 0.03* <0.03 <0.03   No results for input(s): TROPIPOC in the last 168 hours.   BNP Recent Labs Lab 04/26/16 0554  BNP 236.0*     DDimer  Recent Labs Lab 04/26/16 0820  DDIMER 0.33     Radiology    No results found.  Cardiac Studies   2D Echo 04/27/16 Study Conclusions  - Left ventricle: The cavity size was mildly dilated. Wall   thickness was normal. Systolic function was severely reduced. The   estimated  ejection fraction was in the range of 20% to 25%.   Diffuse hypokinesis. There was fusion of early and atrial   contributions to ventricular filling. Doppler parameters are   consistent with a reversible restrictive pattern, indicative of   decreased left ventricular diastolic compliance and/or increased   left atrial pressure (grade 3 diastolic dysfunction). - Mitral valve: There was mild to moderate regurgitation. - Pulmonary arteries: Systolic pressure was moderately increased.   PA peak pressure: 48 mm Hg (S). - Pericardium, extracardiac: A trivial pericardial effusion was   identified.  Patient Profile     56 y.o.AA malewith history of DM type 2 and previous history of drug abuse (drug screen negative this adm), presentedto the ER 04/26/16 withchest tightness, coughing, and shortness of breath for about one week.. Echo showed an ofEF20-25% with grade 3 DD and mild to moderate MR, cardiology consulted. The pt denied any history of chest pain. He improved with diuresis.   Assessment & Plan    1. Acute systolic congestive heart failure- patient's symptoms seem most consistent with CHF. Agree with continued diuresis and follow renal function. I/Os net negative 2L since admit. ? Accuracy of daily weights. Hold lasix today for cath.   2. Systolic cardiomyopathy-etiology unclear: LHC today to r/o ischemic etiology. Continue ACE inhibitor and beta blocker. Titrate as tolerated. Agree with ASA and statin.   3. HTN-blood pressure mildly elevated on adm- now controlled in the 100-110 systolic. Continue Coreg and lisinopril.  4. DM- Uncontrolled DM with HGb A1c 13, management per primary care.  5.  History of substance abuse-patient denies since October. Drug screen negative for cocaine  6. Question pneumonia-antibiotics per primary care. Now with worsening productive cough. Repeat CXR pending.    Signed, Robbie Lis, PA-C  04/30/2016, 8:56 AM   As above, patient seen and  examined. He denies  dyspnea or chest pain. He does continue to have a productive cough. His volume status appears to be stable. Continue aspirin, statin, carvedilol and lisinopril. For cardiac catheterization today to rule out coronary disease as cause of new onset cardiomyopathy. We will resume low-dose diuretic following catheterization. Continue antibiotics for URI/possible pneumonia. Case manager consult for medication assistance.  Olga Millers, MD

## 2016-04-30 NOTE — Progress Notes (Signed)
Received pt from [facility] alert and oriented and denies any discomfort.  Pt has productive clear cough.  Pt on monitor, Saline Lock  Pt waiting on cath procedure and consent is signed.

## 2016-04-30 NOTE — Progress Notes (Signed)
PROGRESS NOTE    James [name]  VEH:[id number] DOB: [birth year] DOA: 04/26/2016 PCP: Jacklynn Barnacle, NP     Brief Narrative:  James [name] a 56 y.o.malewith history of diabetes mellitus type 2 and previous history of drug abuse, as per the patient has not had any cocaine for last 3-4 months presents to the ER because of persistent chest tightness shortness of breath and productive cough over the last 2 days. Patient also has been a subjective feeling of fever chills. In the ER chest x-ray shows cardiomegaly with possible congestion and patient was mildly febrile. Lactate was elevated. Patient's shortness of breath is only on coughing. In the ER patient was given fluid bolus. Influenza PCR was negative. Patient was started on empiric antibiotics for pneumonia after blood cultures were obtained. Patient became progressively short of breath for which patient was placed on BiPAP. Has been weaned to [facility]. Echo showed EF 20-25%, cardiology consulted.   Assessment & Plan:   Principal Problem:   Acute respiratory failure with hypoxia (HCC) Active Problems:   History of cocaine abuse   Community acquired pneumonia   Uncontrolled type 2 diabetes mellitus with hyperglycemia (HCC)   Cardiomyopathy- suspect NICM but etiology not yet determined   Acute combined systolic and diastolic heart failure (HCC)   History of major depression   Acute respiratory failure with hypoxia likely secondary to ?pneumonia and CHF exacerbation  -Patient was placed on BiPAP initially.  Needed nonrebreather in the ER. Now on room air.  -DDimer normal   CAP -Chest x-ray: Bilateral airspace disease showing some improvement in the right lung base compatible with resolving pneumonia and/or edema, small bilateral pleural effusion -Strep pneumonia urine antigen negative. Legionella urine antigen pending  -Blood cultures NGTD  -Sputum culture pending  -Influenza PCR negative -Finish azithromycin  -Will repeat CXR  this morning as patient has worsening productive cough   New combined systolic and diastolic CHF -BNP on admission 236 -Echocardiogram EF 20-25%, Grade 3 diastolic dysfunction -Coreg and ACEi -Cardiology consulted and appreciated -Continue lasix  -Monitor intake/output, daily weights -Plan for heart cath today   Diabetes mellitus, type Ii, uncontrolled  -Metformin held -Hemoglobin A1c 13.2  -Continue lantus, ISS, and CBG monitoring. Increase insulin and add mealtime, nighttime coverage.   History of drug abuse -Drug screen negative  Tobacco abuse -Counseled on smoking cessation   DVT prophylaxis: lovenox Code Status: Full Family Communication: spoke with sister over the phone 1/18 Disposition Plan: pending heart cath 1/19    Consultants:   Cardiology  Procedures:   None  Antimicrobials:  Anti-infectives    Start     Dose/Rate Route Frequency Ordered Stop   04/29/16 0600  azithromycin (ZITHROMAX) tablet 500 mg     500 mg Oral Every 24 hours 04/28/16 1123 05/03/16 0559   04/27/16 0400  cefTRIAXone (ROCEPHIN) 1 g in dextrose 5 % 50 mL IVPB  Status:  Discontinued     1 g 100 mL/hr over 30 Minutes Intravenous Every 24 hours 04/26/16 0514 04/28/16 1154   04/26/16 1000  doxycycline (VIBRA-TABS) tablet 100 mg  Status:  Discontinued     100 mg Oral Every 12 hours 04/26/16 0359 04/26/16 0514   04/26/16 0600  azithromycin (ZITHROMAX) 500 mg in dextrose 5 % 250 mL IVPB  Status:  Discontinued     500 mg 250 mL/hr over 60 Minutes Intravenous Every 24 hours 04/26/16 0514 04/28/16 1123   04/26/16 0400  cefTRIAXone (ROCEPHIN) 1 g in dextrose 5 % 50  mL IVPB     1 g 100 mL/hr over 30 Minutes Intravenous  Once 04/26/16 0359 04/26/16 0438      Subjective: Patient complaining of worsening productive cough, white sputum. He was mildly short of breath this morning when getting out of bed as well. Denies chest pain   Objective: Vitals:   04/29/16 1735 04/29/16 2057 04/30/16  0546 04/30/16 0547  BP: 107/64 102/62  113/77  Pulse: 88 89  87  Resp: 18 17  18   Temp: 97.9 F (36.6 C) 97.7 F (36.5 C)  98.1 F (36.7 C)  TempSrc: Oral Oral  Oral  SpO2: 100% 98%  98%  Weight:   67 kg (147 lb 9.6 oz)   Height:        Intake/Output Summary (Last 24 hours) at 04/30/16 0803 Last data filed at 04/29/16 1714  Gross per 24 hour  Intake              360 ml  Output                0 ml  Net              360 ml   Filed Weights   04/29/16 0500 04/29/16 0958 04/30/16 0546  Weight: 67.3 kg (148 lb 5.9 oz) 66.7 kg (147 lb) 67 kg (147 lb 9.6 oz)    Examination:  General exam: Appears calm and comfortable  Respiratory system: +crackles right lung base. Respiratory effort normal. On room air Cardiovascular system: S1 & S2 heard, tachycardic, regular. No JVD, murmurs, rubs, gallops or clicks. No pedal edema. Gastrointestinal system: Abdomen is nondistended, soft and nontender. No organomegaly or masses felt. Normal bowel sounds heard. Central nervous system: Alert and oriented. No focal neurological deficits. Extremities: Symmetric 5 x 5 power. Skin: No rashes, lesions or ulcers Psychiatry: Judgement and insight appear normal. Mood & affect appropriate.   Data Reviewed: I have personally reviewed following labs and imaging studies  CBC:  Recent Labs Lab 04/26/16 0300 04/26/16 0554 04/30/16 0518  WBC 7.8 9.8 3.0*  NEUTROABS 6.2 9.4* 1.0*  HGB 12.8* 12.2* 13.8  HCT 37.6* 36.3* 40.2  MCV 83.9 85.0 84.1  PLT 242 251 310   Basic Metabolic Panel:  Recent Labs Lab 04/26/16 0554 04/27/16 0332 04/28/16 0338 04/29/16 0344 04/30/16 0518  NA 136 135 133* 133* 134*  K 3.7 4.1 4.0 3.8 3.9  CL 100* 101 99* 98* 99*  CO2 25 26 26 26 28   GLUCOSE 329* 153* 276* 199* 277*  BUN 11 19 18 20 19   CREATININE 0.87 0.94 0.69 0.85 0.93  CALCIUM 8.2* 8.7* 8.5* 8.6* 8.8*   GFR: Estimated Creatinine Clearance: 83 mL/min (by C-G formula based on SCr of 0.93 mg/dL). Liver  Function Tests:  Recent Labs Lab 04/26/16 0300 04/26/16 0554  AST 28 25  ALT 47 44  ALKPHOS 73 67  BILITOT 0.5 0.5  PROT 6.6 6.5  ALBUMIN 3.5 3.2*   No results for input(s): LIPASE, AMYLASE in the last 168 hours. No results for input(s): AMMONIA in the last 168 hours. Coagulation Profile:  Recent Labs Lab 04/30/16 0518  INR 0.94   Cardiac Enzymes:  Recent Labs Lab 04/26/16 0300 04/26/16 0554 04/26/16 1150 04/26/16 1643  TROPONINI <0.03 0.03* <0.03 <0.03   BNP (last 3 results) No results for input(s): PROBNP in the last 8760 hours. HbA1C: No results for input(s): HGBA1C in the last 72 hours. CBG:  Recent Labs Lab 04/29/16 1138 04/29/16 1657  04/29/16 1921 04/29/16 2154 04/30/16 0747  GLUCAP 272* 472* 117* 227* 233*   Lipid Profile:  Recent Labs  04/28/16 0338  CHOL 146  HDL 61  LDLCALC 71  TRIG 71  CHOLHDL 2.4   Thyroid Function Tests: No results for input(s): TSH, T4TOTAL, FREET4, T3FREE, THYROIDAB in the last 72 hours. Anemia Panel: No results for input(s): VITAMINB12, FOLATE, FERRITIN, TIBC, IRON, RETICCTPCT in the last 72 hours. Sepsis Labs:  Recent Labs Lab 04/26/16 0318 04/26/16 0554 04/26/16 0820 04/27/16 0835  LATICACIDVEN 2.53* 1.6 2.2* 1.1    Recent Results (from the past 240 hour(s))  Blood Culture (routine x 2)     Status: None (Preliminary result)   Collection Time: 04/26/16  3:16 AM  Result Value Ref Range Status   Specimen Description BLOOD LEFT ANTECUBITAL  Final   Special Requests BOTTLES DRAWN AEROBIC AND ANAEROBIC  Final   Culture   Final    NO GROWTH 3 DAYS Performed at [facility], [address] [place], [place] [address]    Report Status PENDING  Incomplete  Blood Culture (routine x 2)     Status: None (Preliminary result)   Collection Time: 04/26/16  3:34 AM  Result Value Ref Range Status   Specimen Description BLOOD RIGHT ANTECUBITAL  Final   Special Requests BOTTLES DRAWN AEROBIC AND ANAEROBIC  6CC  Final   Culture   Final    NO GROWTH 3 DAYS Performed at [facility], [address] [place], [place] [address]    Report Status PENDING  Incomplete  Urine culture     Status: None   Collection Time: 04/26/16  4:09 AM  Result Value Ref Range Status   Specimen Description URINE, CLEAN CATCH  Final   Special Requests NONE  Final   Culture NO GROWTH Performed at [facility]   Final   Report Status 04/27/2016 FINAL  Final  MRSA PCR Screening     Status: None   Collection Time: 04/26/16  6:36 AM  Result Value Ref Range Status   MRSA by PCR NEGATIVE NEGATIVE Final    Comment:        The GeneXpert MRSA Assay (FDA approved for NASAL specimens only), is one component of a comprehensive MRSA colonization surveillance program. It is not intended to diagnose MRSA infection nor to guide or monitor treatment for MRSA infections.        Radiology Studies: No results found.    Scheduled Meds: . aspirin  81 mg Oral Daily  . atorvastatin  40 mg Oral q1800  . azithromycin  500 mg Oral Q24H  . carvedilol  12.5 mg Oral BID WC  . dextromethorphan-guaiFENesin  1 tablet Oral BID  . enoxaparin (LOVENOX) injection  40 mg Subcutaneous Q24H  . insulin aspart  0-5 Units Subcutaneous QHS  . insulin aspart  0-9 Units Subcutaneous TID WC  . insulin aspart  3 Units Subcutaneous TID WC  . insulin glargine  15 Units Subcutaneous QHS  . lisinopril  10 mg Oral Daily  . mouth rinse  15 mL Mouth Rinse BID   Continuous Infusions: . sodium chloride       LOS: 4 days    Time spent: 30 minutes   Noralee Stain, DO [facility] [email or web address] Password [email or web address] 04/30/2016, 8:03 AM

## 2016-04-30 NOTE — Progress Notes (Signed)
Progress Note  Patient Name: James [name] Date of Encounter: 04/30/2016  Primary Cardiologist: Dr. Jens Som   Patient Profile     56 y.o.AA malewith history of DM type 2 and previous history of drug abuse (drug screen negative this adm), presentedto the ER 04/26/16 withchest tightness, coughing, and shortness of breath for about one week.. Echo showed an ofEF 20-25% with grade 3 DD and mild to moderate MR, cardiology consulted. The pt denied any history of chest pain. He improved with diuresis.   Subjective   Worsening productive cough, now getting repeat CXR.   Inpatient Medications    Scheduled Meds: . aspirin  81 mg Oral Daily  . atorvastatin  40 mg Oral q1800  . azithromycin  500 mg Oral Q24H  . carvedilol  12.5 mg Oral BID WC  . dextromethorphan-guaiFENesin  1 tablet Oral BID  . enoxaparin (LOVENOX) injection  40 mg Subcutaneous Q24H  . insulin aspart  0-5 Units Subcutaneous QHS  . insulin aspart  0-9 Units Subcutaneous TID WC  . insulin aspart  3 Units Subcutaneous TID WC  . insulin glargine  15 Units Subcutaneous QHS  . lisinopril  10 mg Oral Daily  . mouth rinse  15 mL Mouth Rinse BID   Continuous Infusions: . sodium chloride 50 mL/hr at 04/30/16 0833   PRN Meds: acetaminophen **OR** acetaminophen, hydrALAZINE, ondansetron **OR** ondansetron (ZOFRAN) IV   Vital Signs    Vitals:   04/29/16 2057 04/30/16 0546 04/30/16 0547 04/30/16 0823  BP: 102/62  113/77 111/72  Pulse: 89  87 92  Resp: 17  18 18   Temp: 97.7 F (36.5 C)  98.1 F (36.7 C) 97.5 F (36.4 C)  TempSrc: Oral  Oral Oral  SpO2: 98%  98% 97%  Weight:  147 lb 9.6 oz (67 kg)    Height:        Intake/Output Summary (Last 24 hours) at 04/30/16 0856 Last data filed at 04/29/16 1714  Gross per 24 hour  Intake              360 ml  Output                0 ml  Net              360 ml   Filed Weights   04/29/16 0500 04/29/16 0958 04/30/16 0546  Weight: 148 lb 5.9 oz (67.3 kg) 147 lb  (66.7 kg) 147 lb 9.6 oz (67 kg)    Telemetry    NSR 90s - Personally Reviewed   Physical Exam   GEN: No acute distress.  Neck: No JVD  Cardiac: RRR, no murmurs, rubs, or gallops.  Radials/DP/PT 2+ and equal bilaterally.  Respiratory:  Clear to auscultation bilaterally. GI: Soft, nontender, non-distended  MS: no deformity; no edema Neuro:  Alert and oriented x 3  Labs    Chemistry Recent Labs Lab 04/26/16 0300 04/26/16 0554  04/28/16 0338 04/29/16 0344 04/30/16 0518  NA 137 136  < > 133* 133* 134*  K 3.2* 3.7  < > 4.0 3.8 3.9  CL 100* 100*  < > 99* 98* 99*  CO2 25 25  < > 26 26 28   GLUCOSE 313* 329*  < > 276* 199* 277*  BUN 13 11  < > 18 20 19   CREATININE 0.98 0.87  < > 0.69 0.85 0.93  CALCIUM 8.7* 8.2*  < > 8.5* 8.6* 8.8*  PROT 6.6 6.5  --   --   --   --  ALBUMIN 3.5 3.2*  --   --   --   --   AST 28 25  --   --   --   --   ALT 47 44  --   --   --   --   ALKPHOS 73 67  --   --   --   --   BILITOT 0.5 0.5  --   --   --   --   GFRNONAA >60 >60  < > >60 >60 >60  GFRAA >60 >60  < > >60 >60 >60  ANIONGAP 12 11  < > 8 9 7   < > = values in this interval not displayed.   Hematology Recent Labs Lab 04/26/16 0300 04/26/16 0554 04/30/16 0518  WBC 7.8 9.8 3.0*  RBC 4.48 4.27 4.78  HGB 12.8* 12.2* 13.8  HCT 37.6* 36.3* 40.2  MCV 83.9 85.0 84.1  MCH 28.6 28.6 28.9  MCHC 34.0 33.6 34.3  RDW 13.5 13.8 13.3  PLT 242 251 310    Cardiac Enzymes Recent Labs Lab 04/26/16 0300 04/26/16 0554 04/26/16 1150 04/26/16 1643  TROPONINI <0.03 0.03* <0.03 <0.03   No results for input(s): TROPIPOC in the last 168 hours.   BNP Recent Labs Lab 04/26/16 0554  BNP 236.0*     DDimer  Recent Labs Lab 04/26/16 0820  DDIMER 0.33     Radiology    No results found.  Cardiac Studies   2D Echo 04/27/16 Study Conclusions  - Left ventricle: The cavity size was mildly dilated. Wall   thickness was normal. Systolic function was severely reduced. The   estimated  ejection fraction was in the range of 20% to 25%.   Diffuse hypokinesis. There was fusion of early and atrial   contributions to ventricular filling. Doppler parameters are   consistent with a reversible restrictive pattern, indicative of   decreased left ventricular diastolic compliance and/or increased   left atrial pressure (grade 3 diastolic dysfunction). - Mitral valve: There was mild to moderate regurgitation. - Pulmonary arteries: Systolic pressure was moderately increased.   PA peak pressure: 48 mm Hg (S). - Pericardium, extracardiac: A trivial pericardial effusion was   identified.  Patient Profile     56 y.o.AA malewith history of DM type 2 and previous history of drug abuse (drug screen negative this adm), presentedto the ER 04/26/16 withchest tightness, coughing, and shortness of breath for about one week.. Echo showed an ofEF20-25% with grade 3 DD and mild to moderate MR, cardiology consulted. The pt denied any history of chest pain. He improved with diuresis.   Assessment & Plan    1. Acute systolic congestive heart failure- patient's symptoms seem most consistent with CHF. Agree with continued diuresis and follow renal function. I/Os net negative 2L since admit. ? Accuracy of daily weights. Hold lasix today for cath.   2. Systolic cardiomyopathy-etiology unclear: LHC today to r/o ischemic etiology. Continue ACE inhibitor and beta blocker. Titrate as tolerated. Agree with ASA and statin.   3. HTN-blood pressure mildly elevated on adm- now controlled in the 100-110 systolic. Continue Coreg and lisinopril.  4. DM- Uncontrolled DM with HGb A1c 13, management per primary care.  5.  History of substance abuse-patient denies since October. Drug screen negative for cocaine  6. Question pneumonia-antibiotics per primary care. Now with worsening productive cough. Repeat CXR pending.    Signed, Robbie Lis, PA-C  04/30/2016, 8:56 AM   As above, patient seen and  examined. He denies  dyspnea or chest pain. He does continue to have a productive cough. His volume status appears to be stable. Continue aspirin, statin, carvedilol and lisinopril. For cardiac catheterization today to rule out coronary disease as cause of new onset cardiomyopathy. We will resume low-dose diuretic following catheterization. Continue antibiotics for URI/possible pneumonia. Case manager consult for medication assistance.  Olga Millers, MD

## 2016-04-30 NOTE — Interval H&P Note (Signed)
History and Physical Interval Note:  04/30/2016 2:03 PM  James [name]  has presented today for cardiac catheterization, with the diagnosis of acute systolic heart failure. The various methods of treatment have been discussed with the patient and family. After consideration of risks, benefits and other options for treatment, the patient has consented to  Procedure(s): Right/Left Heart Cath and Coronary Angiography (N/A) as a surgical intervention .  The patient's history has been reviewed, patient examined, no change in status, stable for surgery.  I have reviewed the patient's chart and labs.  Questions were answered to the patient's satisfaction.    Cath Lab Visit (complete for each Cath Lab visit)  Clinical Evaluation Leading to the Procedure:   ACS: No.  Non-ACS:    Anginal Classification: CCS IV (shortness of breath/heart failure sx)  Anti-ischemic medical therapy: Minimal Therapy (1 class of medications)  Non-Invasive Test Results: No non-invasive testing performed (LVEF severely reduced by echo)  Prior CABG: No previous CABG  James [name]

## 2016-04-30 NOTE — Progress Notes (Signed)
TR BAND REMOVAL  LOCATION:    Radial  Right radial  DEFLATED PER PROTOCOL:   yes  TIME BAND OFF / DRESSING APPLIED:    1715/gauze and small tegaderm  SITE UPON ARRIVAL:    Level  0  SITE AFTER BAND REMOVAL:    Level  0  CIRCULATION SENSATION AND MOVEMENT:    Within Normal Limits :  yes  COMMENTS:

## 2016-04-30 NOTE — Progress Notes (Signed)
Patient back from cath lab, alert and oriented, denies any pain/distress, cath site-radial intact no bleeding. CBG-461 scheduled insulin given and Choi notified, waiting on call back. Will continue to monitor patient

## 2016-05-01 DIAGNOSIS — E1165 Type 2 diabetes mellitus with hyperglycemia: Secondary | ICD-10-CM

## 2016-05-01 DIAGNOSIS — I428 Other cardiomyopathies: Secondary | ICD-10-CM

## 2016-05-01 DIAGNOSIS — Z87898 Personal history of other specified conditions: Secondary | ICD-10-CM

## 2016-05-01 DIAGNOSIS — J9601 Acute respiratory failure with hypoxia: Secondary | ICD-10-CM

## 2016-05-01 LAB — CULTURE, BLOOD (ROUTINE X 2)
CULTURE: NO GROWTH
Culture: NO GROWTH

## 2016-05-01 LAB — GLUCOSE, CAPILLARY
GLUCOSE-CAPILLARY: 224 mg/dL — AB (ref 65–99)
Glucose-Capillary: 393 mg/dL — ABNORMAL HIGH (ref 65–99)
Glucose-Capillary: 323 mg/dL — ABNORMAL HIGH (ref 65–99)
Glucose-Capillary: 229 mg/dL — ABNORMAL HIGH (ref 65–99)

## 2016-05-01 MED ORDER — FLUTICASONE PROPIONATE 50 MCG/ACT NA SUSP
2.0000 | Freq: Every day | NASAL | Status: DC
  Administered 2016-05-01 – 2016-05-02 (×2): 2 via NASAL
  Filled 2016-05-01: qty 16

## 2016-05-01 MED ORDER — POTASSIUM CHLORIDE CRYS ER 10 MEQ PO TBCR
10.0000 meq | EXTENDED_RELEASE_TABLET | Freq: Every day | ORAL | Status: DC
  Administered 2016-05-01 – 2016-05-02 (×2): 10 meq via ORAL
  Filled 2016-05-01 (×2): qty 1

## 2016-05-01 MED ORDER — FUROSEMIDE 20 MG PO TABS
20.0000 mg | ORAL_TABLET | Freq: Every day | ORAL | Status: DC
  Administered 2016-05-01 – 2016-05-02 (×2): 20 mg via ORAL
  Filled 2016-05-01 (×2): qty 1

## 2016-05-01 NOTE — Progress Notes (Signed)
PROGRESS NOTE    James [name]  UJW:[id number] DOB: 1961-03-19 DOA: 04/26/2016 PCP: Jacklynn Barnacle, NP     Brief Narrative:  Satira Mccallum a 56 y.o.malewith history of diabetes mellitus type 2 and previous history of drug abuse, as per the patient has not had any cocaine for last 3-4 months presents to the ER because of persistent chest tightness shortness of breath and productive cough over the last 2 days. Patient also has been a subjective feeling of fever chills. In the ER chest x-ray shows cardiomegaly with possible congestion and patient was mildly febrile. Lactate was elevated. Patient's shortness of breath is only on coughing. In the ER patient was given fluid bolus. Influenza PCR was negative. Patient was started on empiric antibiotics for pneumonia after blood cultures were obtained. Patient became progressively short of breath for which patient was placed on BiPAP. Has been weaned to [facility]. Echo showed EF 20-25%, cardiology consulted.   Assessment & Plan:   Principal Problem:   Acute respiratory failure with hypoxia (HCC) Active Problems:   History of cocaine abuse   Community acquired pneumonia   Uncontrolled type 2 diabetes mellitus with hyperglycemia (HCC)   Cardiomyopathy- suspect NICM but etiology not yet determined   Acute combined systolic and diastolic heart failure (HCC)   History of major depression   Acute respiratory failure with hypoxia likely secondary to pneumonia and CHF exacerbation  -Patient was placed on BiPAP initially.  Needed nonrebreather in the ER. Now on room air.  -DDimer normal   CAP -Chest x-ray 1/15: Bilateral airspace disease showing some improvement in the right lung base compatible with resolving pneumonia and/or edema, small bilateral pleural effusion -Repeat x-ray 1/19: Improvement in aeration. Residual small streaky atelectasis, infiltrate or scarring right base posterior medially. No pulmonary edema.  -Strep pneumonia urine antigen  negative. Legionella urine antigen negative. Influenza PCR negative -Blood cultures NGTD  -Sputum culture pending  -Finish azithromycin   New combined systolic and diastolic CHF -Echocardiogram EF 20-25%, Grade 3 diastolic dysfunction -Coreg and ACEi, lasix  -Cardiology consulted and appreciated -Monitor intake/output, daily weights -S/p heart cath 1/19. Medical therapy recommended   Diabetes mellitus, type Ii, uncontrolled  -Metformin held -Hemoglobin A1c 13.2  -Continue lantus, ISS, and CBG monitoring. Increase insulin and add mealtime, nighttime coverage.   History of drug abuse -Drug screen negative  Tobacco abuse -Counseled on smoking cessation   DVT prophylaxis: lovenox Code Status: Full Family Communication: spoke with sister over the phone 1/18 Disposition Plan: discharge home 1/21. Need CM assistance with home medication and insurance    Consultants:   Cardiology  Procedures:   None  Antimicrobials:  Anti-infectives    Start     Dose/Rate Route Frequency Ordered Stop   04/29/16 0600  azithromycin (ZITHROMAX) tablet 500 mg     500 mg Oral Every 24 hours 04/28/16 1123 05/03/16 0559   04/27/16 0400  cefTRIAXone (ROCEPHIN) 1 g in dextrose 5 % 50 mL IVPB  Status:  Discontinued     1 g 100 mL/hr over 30 Minutes Intravenous Every 24 hours 04/26/16 0514 04/28/16 1154   04/26/16 1000  doxycycline (VIBRA-TABS) tablet 100 mg  Status:  Discontinued     100 mg Oral Every 12 hours 04/26/16 0359 04/26/16 0514   04/26/16 0600  azithromycin (ZITHROMAX) 500 mg in dextrose 5 % 250 mL IVPB  Status:  Discontinued     500 mg 250 mL/hr over 60 Minutes Intravenous Every 24 hours 04/26/16 0514 04/28/16 1123  04/26/16 0400  cefTRIAXone (ROCEPHIN) 1 g in dextrose 5 % 50 mL IVPB     1 g 100 mL/hr over 30 Minutes Intravenous  Once 04/26/16 0359 04/26/16 0438      Subjective: No new complaints today.  Objective: Vitals:   04/30/16 1725 04/30/16 1831 04/30/16 2005  05/01/16 0551  BP: (!) 113/51 (!) 118/56 113/71 120/77  Pulse: 86 93 90 91  Resp: 18 20 20 20   Temp:  97.8 F (36.6 C) 98.4 F (36.9 C) 98.2 F (36.8 C)  TempSrc:  Oral Oral Oral  SpO2: 100% 100% 97% 100%  Weight:    67.1 kg (147 lb 14.9 oz)  Height:        Intake/Output Summary (Last 24 hours) at 05/01/16 0900 Last data filed at 05/01/16 0751  Gross per 24 hour  Intake              720 ml  Output              400 ml  Net              320 ml   Filed Weights   04/29/16 0958 04/30/16 0546 05/01/16 0551  Weight: 66.7 kg (147 lb) 67 kg (147 lb 9.6 oz) 67.1 kg (147 lb 14.9 oz)    Examination:  General exam: Appears calm and comfortable  Respiratory system: +crackles right lung base. Respiratory effort normal. On room air Cardiovascular system: S1 & S2 heard, RRR. No JVD, murmurs, rubs, gallops or clicks. No pedal edema. Gastrointestinal system: Abdomen is nondistended, soft and nontender. No organomegaly or masses felt. Normal bowel sounds heard. Central nervous system: Alert and oriented. No focal neurological deficits. Extremities: Symmetric 5 x 5 power. Skin: No rashes, lesions or ulcers Psychiatry: Judgement and insight appear normal. Mood & affect appropriate.   Data Reviewed: I have personally reviewed following labs and imaging studies  CBC:  Recent Labs Lab 04/26/16 0300 04/26/16 0554 04/30/16 0518 04/30/16 1859  WBC 7.8 9.8 3.0* 2.6*  NEUTROABS 6.2 9.4* 1.0*  --   HGB 12.8* 12.2* 13.8 12.7*  HCT 37.6* 36.3* 40.2 37.1*  MCV 83.9 85.0 84.1 83.0  PLT 242 251 310 295   Basic Metabolic Panel:  Recent Labs Lab 04/26/16 0554 04/27/16 0332 04/28/16 0338 04/29/16 0344 04/30/16 0518 04/30/16 1859  NA 136 135 133* 133* 134*  --   K 3.7 4.1 4.0 3.8 3.9  --   CL 100* 101 99* 98* 99*  --   CO2 25 26 26 26 28   --   GLUCOSE 329* 153* 276* 199* 277*  --   BUN 11 19 18 20 19   --   CREATININE 0.87 0.94 0.69 0.85 0.93 0.87  CALCIUM 8.2* 8.7* 8.5* 8.6* 8.8*  --      GFR: Estimated Creatinine Clearance: 88.9 mL/min (by C-G formula based on SCr of 0.87 mg/dL). Liver Function Tests:  Recent Labs Lab 04/26/16 0300 04/26/16 0554  AST 28 25  ALT 47 44  ALKPHOS 73 67  BILITOT 0.5 0.5  PROT 6.6 6.5  ALBUMIN 3.5 3.2*   No results for input(s): LIPASE, AMYLASE in the last 168 hours. No results for input(s): AMMONIA in the last 168 hours. Coagulation Profile:  Recent Labs Lab 04/30/16 0518  INR 0.94   Cardiac Enzymes:  Recent Labs Lab 04/26/16 0300 04/26/16 0554 04/26/16 1150 04/26/16 1643  TROPONINI <0.03 0.03* <0.03 <0.03   BNP (last 3 results) No results for input(s): PROBNP in the  last 8760 hours. HbA1C: No results for input(s): HGBA1C in the last 72 hours. CBG:  Recent Labs Lab 04/30/16 1228 04/30/16 1529 04/30/16 1825 04/30/16 2142 05/01/16 0739  GLUCAP 193* 175* 461* 146* 224*   Lipid Profile: No results for input(s): CHOL, HDL, LDLCALC, TRIG, CHOLHDL, LDLDIRECT in the last 72 hours. Thyroid Function Tests: No results for input(s): TSH, T4TOTAL, FREET4, T3FREE, THYROIDAB in the last 72 hours. Anemia Panel: No results for input(s): VITAMINB12, FOLATE, FERRITIN, TIBC, IRON, RETICCTPCT in the last 72 hours. Sepsis Labs:  Recent Labs Lab 04/26/16 0318 04/26/16 0554 04/26/16 0820 04/27/16 0835  LATICACIDVEN 2.53* 1.6 2.2* 1.1    Recent Results (from the past 240 hour(s))  Blood Culture (routine x 2)     Status: None (Preliminary result)   Collection Time: 04/26/16  3:16 AM  Result Value Ref Range Status   Specimen Description BLOOD LEFT ANTECUBITAL  Final   Special Requests BOTTLES DRAWN AEROBIC AND ANAEROBIC  Final   Culture   Final    NO GROWTH 4 DAYS Performed at [facility], [address] [place], [place] [address]    Report Status PENDING  Incomplete  Blood Culture (routine x 2)     Status: None (Preliminary result)   Collection Time: 04/26/16  3:34 AM  Result Value Ref Range Status    Specimen Description BLOOD RIGHT ANTECUBITAL  Final   Special Requests BOTTLES DRAWN AEROBIC AND ANAEROBIC 6CC  Final   Culture   Final    NO GROWTH 4 DAYS Performed at [facility], [address] [place], [place] [address]    Report Status PENDING  Incomplete  Urine culture     Status: None   Collection Time: 04/26/16  4:09 AM  Result Value Ref Range Status   Specimen Description URINE, CLEAN CATCH  Final   Special Requests NONE  Final   Culture NO GROWTH Performed at [facility]   Final   Report Status 04/27/2016 FINAL  Final  MRSA PCR Screening     Status: None   Collection Time: 04/26/16  6:36 AM  Result Value Ref Range Status   MRSA by PCR NEGATIVE NEGATIVE Final    Comment:        The GeneXpert MRSA Assay (FDA approved for NASAL specimens only), is one component of a comprehensive MRSA colonization surveillance program. It is not intended to diagnose MRSA infection nor to guide or monitor treatment for MRSA infections.        Radiology Studies: Dg Chest 2 View  Result Date: 04/30/2016 CLINICAL DATA:  Pneumonia, cough EXAM: CHEST  2 VIEW COMPARISON:  04/26/2016 FINDINGS: Cardiomediastinal silhouette is stable. Improvement in aeration. Residual small streaky atelectasis, infiltrate or scarring right base posterior medially. No pulmonary edema. IMPRESSION: Improvement in aeration. Residual small streaky atelectasis, infiltrate or scarring right base posterior medially. No pulmonary edema. Electronically Signed   By: Natasha Mead M.D.   On: 04/30/2016 09:23      Scheduled Meds: . aspirin  81 mg Oral Daily  . atorvastatin  40 mg Oral q1800  . azithromycin  500 mg Oral Q24H  . carvedilol  12.5 mg Oral BID WC  . dextromethorphan-guaiFENesin  1 tablet Oral BID  . enoxaparin (LOVENOX) injection  40 mg Subcutaneous Q24H  . fluticasone  2 spray Each Nare Daily  . furosemide  20 mg Oral Daily  . insulin aspart  0-5 Units Subcutaneous QHS  . insulin aspart   0-9  Units Subcutaneous TID WC  . insulin aspart  3 Units Subcutaneous TID WC  . insulin glargine  15 Units Subcutaneous QHS  . lisinopril  10 mg Oral Daily  . mouth rinse  15 mL Mouth Rinse BID  . potassium chloride  10 mEq Oral Daily  . sodium chloride flush  3 mL Intravenous Q12H   Continuous Infusions:    LOS: 5 days    Time spent: 30 minutes   Noralee Stain, DO [facility] [email or web address] Password [email or web address] 05/01/2016, 9:00 AM

## 2016-05-01 NOTE — Progress Notes (Signed)
Progress Note  Patient Name: James [name] Date of Encounter: 05/01/2016  Primary Cardiologist: Dr. Olga Millers  Subjective   Eating breakfast. No chest pain or shortness of breath at rest. Still has intermittent cough.  Inpatient Medications    Scheduled Meds: . aspirin  81 mg Oral Daily  . atorvastatin  40 mg Oral q1800  . azithromycin  500 mg Oral Q24H  . carvedilol  12.5 mg Oral BID WC  . dextromethorphan-guaiFENesin  1 tablet Oral BID  . enoxaparin (LOVENOX) injection  40 mg Subcutaneous Q24H  . insulin aspart  0-5 Units Subcutaneous QHS  . insulin aspart  0-9 Units Subcutaneous TID WC  . insulin aspart  3 Units Subcutaneous TID WC  . insulin glargine  15 Units Subcutaneous QHS  . lisinopril  10 mg Oral Daily  . mouth rinse  15 mL Mouth Rinse BID  . sodium chloride flush  3 mL Intravenous Q12H    PRN Meds: sodium chloride, acetaminophen **OR** acetaminophen, hydrALAZINE, ondansetron **OR** ondansetron (ZOFRAN) IV, sodium chloride flush   Vital Signs    Vitals:   04/30/16 1725 04/30/16 1831 04/30/16 2005 05/01/16 0551  BP: (!) 113/51 (!) 118/56 113/71 120/77  Pulse: 86 93 90 91  Resp: 18 20 20 20   Temp:  97.8 F (36.6 C) 98.4 F (36.9 C) 98.2 F (36.8 C)  TempSrc:  Oral Oral Oral  SpO2: 100% 100% 97% 100%  Weight:    147 lb 14.9 oz (67.1 kg)  Height:        Intake/Output Summary (Last 24 hours) at 05/01/16 0749 Last data filed at 04/30/16 2006  Gross per 24 hour  Intake              480 ml  Output                0 ml  Net              480 ml   Filed Weights   04/29/16 0958 04/30/16 0546 05/01/16 0551  Weight: 147 lb (66.7 kg) 147 lb 9.6 oz (67 kg) 147 lb 14.9 oz (67.1 kg)    Telemetry    I personally reviewed telemetry which shows sinus rhythm with occasional PVCs and brief bursts of NSVT.  ECG   I personal reviewed the tracing from 04/26/2016 which showed sinus tachycardia with LVH, fusion beat, repolarization abnormalities.  Physical  Exam   GEN: No acute distress.  Neck: No JVD Cardiac: Indistinct PMI, RRR, no gallops.  Respiratory:  Scattered basilar crackles. GI: Soft, nontender, non-distended  MS: No edema; No deformity. Neuro:  AAOx3. Psych: Normal affect  Labs    Chemistry Recent Labs Lab 04/26/16 0300 04/26/16 0554  04/28/16 0338 04/29/16 0344 04/30/16 0518 04/30/16 1859  NA 137 136  < > 133* 133* 134*  --   K 3.2* 3.7  < > 4.0 3.8 3.9  --   CL 100* 100*  < > 99* 98* 99*  --   CO2 25 25  < > 26 26 28   --   GLUCOSE 313* 329*  < > 276* 199* 277*  --   BUN 13 11  < > 18 20 19   --   CREATININE 0.98 0.87  < > 0.69 0.85 0.93 0.87  CALCIUM 8.7* 8.2*  < > 8.5* 8.6* 8.8*  --   PROT 6.6 6.5  --   --   --   --   --   ALBUMIN 3.5  3.2*  --   --   --   --   --   AST 28 25  --   --   --   --   --   ALT 47 44  --   --   --   --   --   ALKPHOS 73 67  --   --   --   --   --   BILITOT 0.5 0.5  --   --   --   --   --   GFRNONAA >60 >60  < > >60 >60 >60 >60  GFRAA >60 >60  < > >60 >60 >60 >60  ANIONGAP 12 11  < > 8 9 7   --   < > = values in this interval not displayed.   Hematology Recent Labs Lab 04/26/16 0554 04/30/16 0518 04/30/16 1859  WBC 9.8 3.0* 2.6*  RBC 4.27 4.78 4.47  HGB 12.2* 13.8 12.7*  HCT 36.3* 40.2 37.1*  MCV 85.0 84.1 83.0  MCH 28.6 28.9 28.4  MCHC 33.6 34.3 34.2  RDW 13.8 13.3 13.2  PLT 251 310 295    Cardiac Enzymes Recent Labs Lab 04/26/16 0300 04/26/16 0554 04/26/16 1150 04/26/16 1643  TROPONINI <0.03 0.03* <0.03 <0.03   No results for input(s): TROPIPOC in the last 168 hours.   BNP Recent Labs Lab 04/26/16 0554  BNP 236.0*     DDimer  Recent Labs Lab 04/26/16 0820  DDIMER 0.33     Radiology    Dg Chest 2 View  Result Date: 04/30/2016 CLINICAL DATA:  Pneumonia, cough EXAM: CHEST  2 VIEW COMPARISON:  04/26/2016 FINDINGS: Cardiomediastinal silhouette is stable. Improvement in aeration. Residual small streaky atelectasis, infiltrate or scarring right base  posterior medially. No pulmonary edema. IMPRESSION: Improvement in aeration. Residual small streaky atelectasis, infiltrate or scarring right base posterior medially. No pulmonary edema. Electronically Signed   By: Natasha Mead M.D.   On: 04/30/2016 09:23    Cardiac Studies   Echocardiogram 04/27/2016: Study Conclusions  - Left ventricle: The cavity size was mildly dilated. Wall   thickness was normal. Systolic function was severely reduced. The   estimated ejection fraction was in the range of 20% to 25%.   Diffuse hypokinesis. There was fusion of early and atrial   contributions to ventricular filling. Doppler parameters are   consistent with a reversible restrictive pattern, indicative of   decreased left ventricular diastolic compliance and/or increased   left atrial pressure (grade 3 diastolic dysfunction). - Mitral valve: There was mild to moderate regurgitation. - Pulmonary arteries: Systolic pressure was moderately increased.   PA peak pressure: 48 mm Hg (S). - Pericardium, extracardiac: A trivial pericardial effusion was   identified.  Cardiac catheterization 04/30/2016: Conclusions: 1. Moderate mid LAD stenosis (50%) with otherwise minimal CAD, consistent with nonischemic cardiomyopathy. 2. Normal right heart filling pressures. 3. Upper normal to mildly elevated left heart filling pressures. 4. Mild pulmonary hypertension. 5. Low normal to mildly decreased Fick cardiac output/index.  Patient Profile     56 y.o.AA malewith history of DM type 2 and previous history of drug abuse (drug screen negative this adm), presentedto the ER 04/26/16 withchest tightness, coughing, and shortness of breath for about one week.. Echo showed LVEF 20-25% with grade 3 DD and mild to moderate MR, cardiology consulted. The pt denied any history of chest pain. He improved with diuresis. Cardiac catheterization demonstrates moderate mid LAD disease that is best managed medically, mildly elevated  pulmonary  pressures. Cardiomyopathy is felt to be nonischemic in etiology.  Assessment & Plan    1. Acute (on chronic?) combined heart failure with LVEF 20-25% and grade 3 diastolic dysfunction. Nonischemic etiology suspected with cardiac catheterization demonstrating only moderate stenosis within the LAD that is best managed medically.  2. History of substance abuse including cocaine, reportedly without drug use since October 2017. UDS negative.  3. Type 2 diabetes mellitus, poorly controlled with hemoglobin A1c 13.  4. Hypertension at presentation, blood pressure control improved on medical therapy.  5. Suspected pneumonia, currently on azithromycin per primary team.  6. Leukopenia, WBC 2.6. Possibly related to infection. Workup per primary team.  Discussed cardiac catheterization findings with the patient this morning. Recommend continued medical therapy. He is currently on aspirin, Lipitor, Coreg, and lisinopril. Plan to initiate low-dose Lasix with potassium supplement. Case manager should be in all in terms of any potential assistance programs or applying for Medicaid. Anticipate potential discharge within the next 24 hours.  Signed, Nona Dell, MD  05/01/2016, 7:49 AM

## 2016-05-02 DIAGNOSIS — I25810 Atherosclerosis of coronary artery bypass graft(s) without angina pectoris: Secondary | ICD-10-CM

## 2016-05-02 LAB — BASIC METABOLIC PANEL
Anion gap: 8 (ref 5–15)
BUN: 13 mg/dL (ref 6–20)
CHLORIDE: 101 mmol/L (ref 101–111)
CO2: 25 mmol/L (ref 22–32)
Calcium: 8.6 mg/dL — ABNORMAL LOW (ref 8.9–10.3)
Creatinine, Ser: 0.66 mg/dL (ref 0.61–1.24)
GFR calc Af Amer: >60 mL/min (ref >60)
GFR calc non Af Amer: >60 mL/min (ref >60)
GLUCOSE: 186 mg/dL — AB (ref 65–99)
POTASSIUM: 4.2 mmol/L (ref 3.5–5.1)
Sodium: 134 mmol/L — ABNORMAL LOW (ref 135–145)

## 2016-05-02 LAB — CBC WITH DIFFERENTIAL/PLATELET
Basophils Absolute: 0 10*3/uL (ref 0.0–0.1)
Basophils Relative: 1 %
EOS PCT: 7 %
Eosinophils Absolute: 0.2 10*3/uL (ref 0.0–0.7)
HEMATOCRIT: 37.5 % — AB (ref 39.0–52.0)
Hemoglobin: 12.5 g/dL — ABNORMAL LOW (ref 13.0–17.0)
LYMPHS ABS: 1.8 10*3/uL (ref 0.7–4.0)
LYMPHS PCT: 57 %
MCH: 28 pg (ref 26.0–34.0)
MCHC: 33.3 g/dL (ref 30.0–36.0)
MCV: 84.1 fL (ref 78.0–100.0)
MONO ABS: 0.3 10*3/uL (ref 0.1–1.0)
Monocytes Relative: 8 %
Neutro Abs: 0.9 10*3/uL — ABNORMAL LOW (ref 1.7–7.7)
Neutrophils Relative %: 27 %
PLATELETS: 321 10*3/uL (ref 150–400)
RBC: 4.46 MIL/uL (ref 4.22–5.81)
RDW: 13.1 % (ref 11.5–15.5)
WBC: 3.2 10*3/uL — ABNORMAL LOW (ref 4.0–10.5)

## 2016-05-02 LAB — GLUCOSE, CAPILLARY
Glucose-Capillary: 227 mg/dL — ABNORMAL HIGH (ref 65–99)
Glucose-Capillary: 332 mg/dL — ABNORMAL HIGH (ref 65–99)

## 2016-05-02 MED ORDER — LISINOPRIL 10 MG PO TABS: 10.0000 mg | ORAL_TABLET | Freq: Every day | ORAL | 0 refills | Status: DC

## 2016-05-02 MED ORDER — POTASSIUM CHLORIDE ER 10 MEQ PO TBCR: 10.0000 meq | EXTENDED_RELEASE_TABLET | Freq: Every day | ORAL | 0 refills | Status: DC

## 2016-05-02 MED ORDER — VITAMIN D (ERGOCALCIFEROL) 1.25 MG (50000 UNIT) PO CAPS: 50000.0000 [IU] | ORAL_CAPSULE | ORAL | 0 refills | Status: AC

## 2016-05-02 MED ORDER — METFORMIN HCL 1000 MG PO TABS: 1000.0000 mg | ORAL_TABLET | Freq: Two times a day (BID) | ORAL | 0 refills | Status: DC

## 2016-05-02 MED ORDER — CARVEDILOL 12.5 MG PO TABS: 12.5000 mg | ORAL_TABLET | Freq: Two times a day (BID) | ORAL | 0 refills | Status: DC

## 2016-05-02 MED ORDER — ATORVASTATIN CALCIUM 40 MG PO TABS: 40.0000 mg | ORAL_TABLET | Freq: Every day | ORAL | 0 refills | Status: DC

## 2016-05-02 MED ORDER — "INSULIN SYRINGE 31G X 5/16"" 0.3 ML MISC": 0 refills | Status: DC

## 2016-05-02 MED ORDER — ASPIRIN 81 MG PO CHEW
81.0000 mg | CHEWABLE_TABLET | Freq: Every day | ORAL | 0 refills | Status: DC
Start: 2016-05-02 — End: 2016-09-29

## 2016-05-02 MED ORDER — BLOOD GLUCOSE METER KIT: PACK | 0 refills | Status: DC

## 2016-05-02 MED ORDER — INSULIN GLARGINE 100 UNIT/ML ~~LOC~~ SOLN: 15.0000 [IU] | Freq: Every day | SUBCUTANEOUS | 0 refills | Status: DC

## 2016-05-02 MED ORDER — FUROSEMIDE 20 MG PO TABS: 20.0000 mg | ORAL_TABLET | Freq: Every day | ORAL | 0 refills | Status: DC

## 2016-05-02 NOTE — Discharge Summary (Addendum)
Physician Discharge Summary  James [name] PFX:[id number] DOB: 01-12-1961 DOA: 04/26/2016  PCP: James Kanner, NP  Admit date: 04/26/2016 Discharge date: 05/02/2016  Admitted From: Home Disposition:  Home  Recommendations for Outpatient Follow-up:  1. Follow up with PCP in 1 week 2. Follow up with Cardiology in 1 week  3. Follow up with outpatient diabetic education in 1 week. Ambulatory referral placed.  4. Please obtain BMP/CBC in one week  5. Sputum culture pending at time of discharge  6. Repeat vit D once supplementation complete  Home Health: No  Equipment/Devices: None    Discharge Condition: Stable CODE STATUS: Full  Diet recommendation: Heart healthy/Carb modified   Brief/Interim Summary: James [name] a 56 y.o.malewith history of diabetes mellitus type 2 and previous history of drug abuse, as per the patient has not had any cocaine for last 3-4 months presents to the ER because of persistent chest tightness shortness of breath and productive cough over the last 2 days. Patient also has been a subjective feeling of fever chills. In the ER chest x-ray shows cardiomegaly with possible congestion and patient was mildly febrile. Lactate was elevated. Patient's shortness of breath is only on coughing. In the ER patient was given fluid bolus. Influenza PCR was negative. Patient was started on empiric antibiotics for pneumonia after blood cultures were obtained. Patient became progressively short of breath for which patient was placed on BiPAP, which was eventually weaned to room air. Echo showed EF 20-25%, cardiology consulted. Patient was diuresed and underwent heart catheterization on 1/19, which showed moderate mid LAD stenosis 50% and nonischemic cardiomyopathy. He is to be managed with medical treatments.   Subjective on day of discharge: Feeling well. No complaints of chest pain, shortness of breath, has been ambulating the halls without issue. We discussed importance of  follow up with his PCP, cardiology, diabetic ed, as well as continuing with his drug abuse cessation and follow up with his sponsor.   Discharge Diagnoses:  Principal Problem:   Acute respiratory failure with hypoxia ([place]) Active Problems:   History of cocaine abuse   Community acquired pneumonia   Uncontrolled type 2 diabetes mellitus with hyperglycemia ([place])   Cardiomyopathy- suspect NICM but etiology not yet determined   Acute combined systolic and diastolic heart failure ([facility])   History of major depression   Acute respiratory failure with hypoxia likely secondary to pneumonia and CHF exacerbation  -Patient was placed on BiPAP initially. Needed nonrebreather in the ER. Now on room air.  -DDimer normal   CAP -Chest x-ray 1/15: Bilateral airspace disease showing some improvement in the right lung base compatible with resolving pneumonia and/or edema, small bilateral pleural effusion -Repeat x-ray 1/19: Improvement in aeration. Residual small streaky atelectasis, infiltrate or scarring right base posterior medially. No pulmonary edema.  -Strep pneumonia urine antigen negative. Legionella urine antigen negative. Influenza PCR negative -Blood cultures negative  -Sputum culture pending  -Finished course of azithromycin   New combined systolic and diastolic CHF -Echocardiogram EF 32-99%, Grade 3 diastolic dysfunction -Cardiology consulted and appreciated -Monitor intake/output, daily weights -S/p heart cath 1/19. Medical therapy recommended.  -Aspirin, lipitor, coreg, lisinopril, lasix, potassium   Diabetes mellitus, type Ii, uncontrolled  -Metformin held -Hemoglobin A1c 13.2  -Continue lantus, ISS, and CBG monitoring. Increase insulin and add mealtime, nighttime coverage.  -Will discharge home lantus, metformin, glucometer, and refer to outpatient diabetic education. Per sister, we are hesitant to place on short-acting insulin as patient has history of medical noncompliance,  skipping  meals, and may be at risk of hypoglycemic episodes.   History of drug abuse -Drug screen negative -Counseled. He states that his last cocaine use was in October and is currently with a sponsor for continued cessation   Tobacco abuse -Counseled on smoking cessation  Vit D deficiency  -Replace x 6 weeks    Discharge Instructions  Discharge Instructions    Activity as tolerated - No restrictions    Complete by:  As directed    Ambulatory referral to Nutrition and Diabetic Education    Complete by:  As directed    Diet - low sodium heart healthy    Complete by:  As directed    Diet - low sodium heart healthy    Complete by:  As directed    Increase activity slowly    Complete by:  As directed      Allergies as of 05/02/2016   No Known Allergies     Medication List    STOP taking these medications   insulin aspart 100 UNIT/ML injection Commonly known as:  novoLOG     TAKE these medications   aspirin 81 MG chewable tablet Chew 1 tablet (81 mg total) by mouth daily.   atorvastatin 40 MG tablet Commonly known as:  LIPITOR Take 1 tablet (40 mg total) by mouth daily at 6 PM.   blood glucose meter kit and supplies Dispense based on patient and insurance preference. Use up to four times daily as directed. (FOR ICD-9 250.00, 250.01).   carvedilol 12.5 MG tablet Commonly known as:  COREG Take 1 tablet (12.5 mg total) by mouth 2 (two) times daily with a meal.   furosemide 20 MG tablet Commonly known as:  LASIX Take 1 tablet (20 mg total) by mouth daily.   insulin glargine 100 UNIT/ML injection Commonly known as:  LANTUS Inject 0.15 mLs (15 Units total) into the skin at bedtime. What changed:  how much to take   INSULIN SYRINGE .3CC/31GX5/16" 31G X 5/16" 0.3 ML Misc Use with lantus, once daily   lisinopril 10 MG tablet Commonly known as:  PRINIVIL,ZESTRIL Take 1 tablet (10 mg total) by mouth daily.   metFORMIN 1000 MG tablet Commonly known as:   GLUCOPHAGE Take 1 tablet (1,000 mg total) by mouth 2 (two) times daily with a meal.   potassium chloride 10 MEQ tablet Commonly known as:  K-DUR Take 1 tablet (10 mEq total) by mouth daily. With lasix.      Follow-up Information    James H, NP. Schedule an appointment as soon as possible for a visit in 1 week(s).   Contact information: [place] [address] [phone]        Kirk Ruths, MD. Schedule an appointment as soon as possible for a visit in 1 week(s).   Specialty:  Cardiology Contact information: [address] [place] [address] [phone]          No Known Allergies  Consultations:  Cardiology    Procedures/Studies: Dg Chest 2 View  Result Date: 04/30/2016 CLINICAL DATA:  Pneumonia, cough EXAM: CHEST  2 VIEW COMPARISON:  04/26/2016 FINDINGS: Cardiomediastinal silhouette is stable. Improvement in aeration. Residual small streaky atelectasis, infiltrate or scarring right base posterior medially. No pulmonary edema. IMPRESSION: Improvement in aeration. Residual small streaky atelectasis, infiltrate or scarring right base posterior medially. No pulmonary edema. Electronically Signed   By: Lahoma Crocker M.D.   On: 04/30/2016 09:23   Dg Chest 2 View  Result  Date: 04/26/2016 CLINICAL DATA:  Acute respiratory failure.  Shortness of breath EXAM: CHEST  2 VIEW COMPARISON:  PA and lateral chest 02/26/2012. Single-view of the chest 04/26/2016. FINDINGS: Patchy bilateral airspace disease persists. Aeration appears mildly improved in the right lung base. There is cardiomegaly. No pneumothorax. Small bilateral pleural effusions are seen. IMPRESSION: Bilateral airspace disease shows some improvement in the right lung base compatible with resolving pneumonia and/or edema. Small bilateral pleural effusions. Cardiomegaly. Electronically Signed   By: Inge Rise M.D.   On: 04/26/2016 10:39   Dg Chest Port 1 View  Result Date:  04/26/2016 CLINICAL DATA:  Cough and shortness of breath EXAM: PORTABLE CHEST 1 VIEW COMPARISON:  Chest radiograph 02/26/2012 FINDINGS: There is mild cardiomegaly. There are bibasilar hazy opacities. There are also ill-defined parahilar opacities. There is no pneumothorax. No sizable pleural effusion. IMPRESSION: 1. Mild cardiomegaly with bibasilar opacities, suspect pulmonary edema. Multifocal infection could have a similar appearance. 2. Ill-defined parahilar opacities. When the patient is clinically able, upright PA and lateral radiographs are recommended for further characterization to exclude hilar adenopathy. Electronically Signed   By: Ulyses Jarred M.D.   On: 04/26/2016 03:37    Echo Study Conclusions - Left ventricle: The cavity size was mildly dilated. Wall   thickness was normal. Systolic function was severely reduced. The   estimated ejection fraction was in the range of 20% to 25%.   Diffuse hypokinesis. There was fusion of early and atrial   contributions to ventricular filling. Doppler parameters are   consistent with a reversible restrictive pattern, indicative of   decreased left ventricular diastolic compliance and/or increased   left atrial pressure (grade 3 diastolic dysfunction). - Mitral valve: There was mild to moderate regurgitation. - Pulmonary arteries: Systolic pressure was moderately increased.   PA peak pressure: 48 mm Hg (S). - Pericardium, extracardiac: A trivial pericardial effusion was identified.   Heart cath Conclusions: 1. Moderate mid LAD stenosis (50%) with otherwise minimal CAD, consistent with nonischemic cardiomyopathy. 2. Normal right heart filling pressures. 3. Upper normal to mildly elevated left heart filling pressures. 4. Mild pulmonary hypertension. 5. Low normal to mildly decreased Fick cardiac output/index.  Recommendations: 1. Continue medical therapy for acute systolic and diastolic heart failure. 2. Medical management of nonobstructive  CAD, including statin therapy to prevent progression of disease.   Discharge Exam: Vitals:   05/01/16 2214 05/02/16 0637  BP: 105/69 103/61  Pulse: 81 85  Resp: 18 20  Temp: 97.8 F (36.6 C) 98 F (36.7 C)   Vitals:   05/01/16 1100 05/01/16 1514 05/01/16 2214 05/02/16 0637  BP: 110/63 (!) 100/57 105/69 103/61  Pulse: 82 90 81 85  Resp:  _0 Temp:  97.6 F (36.4 C) 97.8 F (36.6 C) 98 F (36.7 C)  TempSrc:  Oral Oral Oral  SpO2:  99% 99% 96%  Weight:    67.3 kg (148 lb 4.8 oz)  Height:        General: Pt is alert, awake, not in acute distress Cardiovascular: RRR, S1/S2 +, no rubs, no gallops Respiratory: CTA bilaterally, no wheezing, no rhonchi Abdominal: Soft, NT, ND, bowel sounds + Extremities: no edema, no cyanosis    The results of significant diagnostics from this hospitalization (including imaging, microbiology, ancillary and laboratory) are listed below for reference.     Microbiology: Recent Results (from the past 240 hour(s))  Blood Culture (routine x 2)     Status: None   Collection Time: 04/26/16  3:16 AM  Result Value Ref Range Status   Specimen Description BLOOD LEFT ANTECUBITAL  Final   Special Requests BOTTLES DRAWN AEROBIC AND ANAEROBIC 5ML  Final   Culture   Final    NO GROWTH 5 DAYS Performed at [facility], [address], [place], [place] [address]    Report Status 05/01/2016 FINAL  Final  Blood Culture (routine x 2)     Status: None   Collection Time: 04/26/16  3:34 AM  Result Value Ref Range Status   Specimen Description BLOOD RIGHT ANTECUBITAL  Final   Special Requests BOTTLES DRAWN AEROBIC AND ANAEROBIC 6CC  Final   Culture   Final    NO GROWTH 5 DAYS Performed at [facility], [place] [address], [place], [place] [address]    Report Status 05/01/2016 FINAL  Final  Urine culture     Status: None   Collection Time: 04/26/16  4:09 AM  Result Value Ref Range Status   Specimen Description URINE, CLEAN CATCH  Final    Special Requests NONE  Final   Culture NO GROWTH Performed at [facility]   Final   Report Status 04/27/2016 FINAL  Final  MRSA PCR Screening     Status: None   Collection Time: 04/26/16  6:36 AM  Result Value Ref Range Status   MRSA by PCR NEGATIVE NEGATIVE Final    Comment:        The GeneXpert MRSA Assay (FDA approved for NASAL specimens only), is one component of a comprehensive MRSA colonization surveillance program. It is not intended to diagnose MRSA infection nor to guide or monitor treatment for MRSA infections.      Labs: BNP (last 3 results)  Recent Labs  04/26/16 0554  BNP 185.6*   Basic Metabolic Panel:  Recent Labs Lab 04/27/16 0332 04/28/16 0338 04/29/16 0344 04/30/16 0518 04/30/16 1859 05/02/16 0535  NA 135 133* 133* 134*  --  134*  K 4.1 4.0 3.8 3.9  --  4.2  CL 101 99* 98* 99*  --  101  CO2 _0 --  25  GLUCOSE 153* 276* 199* 277*  --  186*  BUN _1 --  13  CREATININE 0.94 0.69 0.85 0.93 0.87 0.66  CALCIUM 8.7* 8.5* 8.6* 8.8*  --  8.6*   Liver Function Tests:  Recent Labs Lab 04/26/16 0300 04/26/16 0554  AST 28 25  ALT 47 44  ALKPHOS 73 67  BILITOT 0.5 0.5  PROT 6.6 6.5  ALBUMIN 3.5 3.2*   No results for input(s): LIPASE, AMYLASE in the last 168 hours. No results for input(s): AMMONIA in the last 168 hours. CBC:  Recent Labs Lab 04/26/16 0300 04/26/16 0554 04/30/16 0518 04/30/16 1859 05/02/16 0535  WBC 7.8 9.8 3.0* 2.6* 3.2*  NEUTROABS 6.2 9.4* 1.0*  --  0.9*  HGB 12.8* 12.2* 13.8 12.7* 12.5*  HCT 37.6* 36.3* 40.2 37.1* 37.5*  MCV 83.9 85.0 84.1 83.0 84.1  PLT 242 251 310 295 321   Cardiac Enzymes:  Recent Labs Lab 04/26/16 0300 04/26/16 0554 04/26/16 1150 04/26/16 1643  TROPONINI <0.03 0.03* <0.03 <0.03   BNP: Invalid input(s): POCBNP CBG:  Recent Labs Lab 05/01/16 0739 05/01/16 1136 05/01/16 1648 05/01/16 2211 05/02/16 0728  GLUCAP 224* 393* 323* 229* 227*   D-Dimer No  results for input(s): DDIMER in the last 72 hours. Hgb A1c No results for input(s): HGBA1C in the last 72 hours.  Lipid Profile No results for input(s): CHOL, HDL, LDLCALC, TRIG, CHOLHDL, LDLDIRECT in the last 72 hours. Thyroid function studies No results for input(s): TSH, T4TOTAL, T3FREE, THYROIDAB in the last 72 hours.  Invalid input(s): FREET3 Anemia work up No results for input(s): VITAMINB12, FOLATE, FERRITIN, TIBC, IRON, RETICCTPCT in the last 72 hours. Urinalysis    Component Value Date/Time   COLORURINE YELLOW 12/06/2012 1637   APPEARANCEUR CLEAR 12/06/2012 1637   LABSPEC 1.041 ([name]) 12/06/2012 1637   PHURINE 6.0 12/06/2012 1637   GLUCOSEU >1000 (A) 12/06/2012 1637   HGBUR NEGATIVE 12/06/2012 1637   HGBUR negative 08/06/2008 1124   BILIRUBINUR NEGATIVE 12/06/2012 1637   KETONESUR NEGATIVE 12/06/2012 1637   PROTEINUR NEGATIVE 12/06/2012 1637   UROBILINOGEN 1.0 12/06/2012 1637   NITRITE NEGATIVE 12/06/2012 1637   LEUKOCYTESUR NEGATIVE 12/06/2012 1637   Sepsis Labs Invalid input(s): PROCALCITONIN,  WBC,  LACTICIDVEN Microbiology Recent Results (from the past 240 hour(s))  Blood Culture (routine x 2)     Status: None   Collection Time: 04/26/16  3:16 AM  Result Value Ref Range Status   Specimen Description BLOOD LEFT ANTECUBITAL  Final   Special Requests BOTTLES DRAWN AEROBIC AND ANAEROBIC 5ML  Final   Culture   Final    NO GROWTH 5 DAYS Performed at [facility], [place] [address] [place], [place] [address]    Report Status 05/01/2016 FINAL  Final  Blood Culture (routine x 2)     Status: None   Collection Time: 04/26/16  3:34 AM  Result Value Ref Range Status   Specimen Description BLOOD RIGHT ANTECUBITAL  Final   Special Requests BOTTLES DRAWN AEROBIC AND ANAEROBIC 6CC  Final   Culture   Final    NO GROWTH 5 DAYS Performed at [facility], [place] [address], [place], [place] [address]    Report Status 05/01/2016 FINAL  Final  Urine culture     Status:  None   Collection Time: 04/26/16  4:09 AM  Result Value Ref Range Status   Specimen Description URINE, CLEAN CATCH  Final   Special Requests NONE  Final   Culture NO GROWTH Performed at [facility]   Final   Report Status 04/27/2016 FINAL  Final  MRSA PCR Screening     Status: None   Collection Time: 04/26/16  6:36 AM  Result Value Ref Range Status   MRSA by PCR NEGATIVE NEGATIVE Final    Comment:        The GeneXpert MRSA Assay (FDA approved for NASAL specimens only), is one component of a comprehensive MRSA colonization surveillance program. It is not intended to diagnose MRSA infection nor to guide or monitor treatment for MRSA infections.      Time coordinating discharge: Over 30 minutes  SIGNED:  Dessa Phi, DO [facility] Pager [phone]  If 7PM-7AM, please contact night-coverage [email or web address] Password [facility] 05/02/2016, 8:42 AM

## 2016-05-02 NOTE — Care Management Note (Addendum)
Case Management Note  Patient Details  Name: James [name] MRN: [id number] Date of Birth: Dec 19, 1960  Subjective/Objective:     DM               Action/Plan: Discharge Planning: AVS reviewed: NCM spoke to pt and states he goes to [facility] on [address]. PCP Lavinia Sharps NP. Will provide pt with MATCH. Explained program and he can receive his medication for $3 and can use once per year. Pt states he has glucometer at home. Will call his PCP and arrange follow up appt.    PCP Mee Hives ANN NP   Expected Discharge Date:  05/02/16               Expected Discharge Plan:  Home/Self Care  In-House Referral:  NA  Discharge planning Services  CM Consult, Medication Assistance, MATCH Program  Post Acute Care Choice:  NA Choice offered to:  NA  DME Arranged:  N/A DME Agency:  NA  HH Arranged:  NA HH Agency:  NA  Status of Service:  Completed, signed off  If discussed at Long Length of Stay Meetings, dates discussed:    Additional Comments:  Elliot Cousin, RN 05/02/2016, 9:41 AM

## 2016-05-02 NOTE — Progress Notes (Signed)
Progress Note  Patient Name: James [name] Date of Encounter: 05/02/2016  Primary Cardiologist: Dr. Olga Millers  Subjective   Feels better today. Less coughing. No chest pain.  Inpatient Medications    Scheduled Meds: . aspirin  81 mg Oral Daily  . atorvastatin  40 mg Oral q1800  . carvedilol  12.5 mg Oral BID WC  . dextromethorphan-guaiFENesin  1 tablet Oral BID  . enoxaparin (LOVENOX) injection  40 mg Subcutaneous Q24H  . fluticasone  2 spray Each Nare Daily  . furosemide  20 mg Oral Daily  . insulin aspart  0-5 Units Subcutaneous QHS  . insulin aspart  0-9 Units Subcutaneous TID WC  . insulin aspart  3 Units Subcutaneous TID WC  . insulin glargine  15 Units Subcutaneous QHS  . lisinopril  10 mg Oral Daily  . mouth rinse  15 mL Mouth Rinse BID  . potassium chloride  10 mEq Oral Daily  . sodium chloride flush  3 mL Intravenous Q12H    PRN Meds: sodium chloride, acetaminophen **OR** acetaminophen, hydrALAZINE, ondansetron **OR** ondansetron (ZOFRAN) IV, sodium chloride flush   Vital Signs    Vitals:   05/01/16 1100 05/01/16 1514 05/01/16 2214 05/02/16 0637  BP: 110/63 (!) 100/57 105/69 103/61  Pulse: 82 90 81 85  Resp:  18 18 20   Temp:  97.6 F (36.4 C) 97.8 F (36.6 C) 98 F (36.7 C)  TempSrc:  Oral Oral Oral  SpO2:  99% 99% 96%  Weight:    148 lb 4.8 oz (67.3 kg)  Height:        Intake/Output Summary (Last 24 hours) at 05/02/16 0802 Last data filed at 05/02/16 0600  Gross per 24 hour  Intake              240 ml  Output                0 ml  Net              240 ml   Filed Weights   04/30/16 0546 05/01/16 0551 05/02/16 0637  Weight: 147 lb 9.6 oz (67 kg) 147 lb 14.9 oz (67.1 kg) 148 lb 4.8 oz (67.3 kg)    Telemetry    I personally reviewed telemetry which shows sinus rhythm.  ECG   I personal reviewed the tracing from 04/26/2016 which showed sinus tachycardia with LVH, fusion beat, repolarization abnormalities.  Physical Exam   GEN: No  acute distress.  Neck: No JVD Cardiac: Indistinct PMI, RRR, no gallops.  Respiratory:  Scattered basilar crackles. GI: Soft, nontender, non-distended  MS: No edema; No deformity. Neuro:  AAOx3. Psych: Normal affect  Labs    Chemistry Recent Labs Lab 04/26/16 0300 04/26/16 0554  04/29/16 0344 04/30/16 0518 04/30/16 1859 05/02/16 0535  NA 137 136  < > 133* 134*  --  134*  K 3.2* 3.7  < > 3.8 3.9  --  4.2  CL 100* 100*  < > 98* 99*  --  101  CO2 25 25  < > 26 28  --  25  GLUCOSE 313* 329*  < > 199* 277*  --  186*  BUN 13 11  < > 20 19  --  13  CREATININE 0.98 0.87  < > 0.85 0.93 0.87 0.66  CALCIUM 8.7* 8.2*  < > 8.6* 8.8*  --  8.6*  PROT 6.6 6.5  --   --   --   --   --  ALBUMIN 3.5 3.2*  --   --   --   --   --   AST 28 25  --   --   --   --   --   ALT 47 44  --   --   --   --   --   ALKPHOS 73 67  --   --   --   --   --   BILITOT 0.5 0.5  --   --   --   --   --   GFRNONAA >60 >60  < > >60 >60 >60 >60  GFRAA >60 >60  < > >60 >60 >60 >60  ANIONGAP 12 11  < > 9 7  --  8  < > = values in this interval not displayed.   Hematology  Recent Labs Lab 04/30/16 0518 04/30/16 1859 05/02/16 0535  WBC 3.0* 2.6* 3.2*  RBC 4.78 4.47 4.46  HGB 13.8 12.7* 12.5*  HCT 40.2 37.1* 37.5*  MCV 84.1 83.0 84.1  MCH 28.9 28.4 28.0  MCHC 34.3 34.2 33.3  RDW 13.3 13.2 13.1  PLT 310 295 321    Cardiac Enzymes  Recent Labs Lab 04/26/16 0300 04/26/16 0554 04/26/16 1150 04/26/16 1643  TROPONINI <0.03 0.03* <0.03 <0.03   No results for input(s): TROPIPOC in the last 168 hours.   BNP  Recent Labs Lab 04/26/16 0554  BNP 236.0*     DDimer   Recent Labs Lab 04/26/16 0820  DDIMER 0.33     Radiology    Dg Chest 2 View  Result Date: 04/30/2016 CLINICAL DATA:  Pneumonia, cough EXAM: CHEST  2 VIEW COMPARISON:  04/26/2016 FINDINGS: Cardiomediastinal silhouette is stable. Improvement in aeration. Residual small streaky atelectasis, infiltrate or scarring right base posterior  medially. No pulmonary edema. IMPRESSION: Improvement in aeration. Residual small streaky atelectasis, infiltrate or scarring right base posterior medially. No pulmonary edema. Electronically Signed   By: Natasha Mead M.D.   On: 04/30/2016 09:23    Cardiac Studies   Echocardiogram 04/27/2016: Study Conclusions  - Left ventricle: The cavity size was mildly dilated. Wall   thickness was normal. Systolic function was severely reduced. The   estimated ejection fraction was in the range of 20% to 25%.   Diffuse hypokinesis. There was fusion of early and atrial   contributions to ventricular filling. Doppler parameters are   consistent with a reversible restrictive pattern, indicative of   decreased left ventricular diastolic compliance and/or increased   left atrial pressure (grade 3 diastolic dysfunction). - Mitral valve: There was mild to moderate regurgitation. - Pulmonary arteries: Systolic pressure was moderately increased.   PA peak pressure: 48 mm Hg (S). - Pericardium, extracardiac: A trivial pericardial effusion was   identified.  Cardiac catheterization 04/30/2016: Conclusions: 1. Moderate mid LAD stenosis (50%) with otherwise minimal CAD, consistent with nonischemic cardiomyopathy. 2. Normal right heart filling pressures. 3. Upper normal to mildly elevated left heart filling pressures. 4. Mild pulmonary hypertension. 5. Low normal to mildly decreased Fick cardiac output/index.  Patient Profile     56 y.o.AA malewith history of DM type 2 and previous history of drug abuse (drug screen negative this adm), presentedto the ER 04/26/16 withchest tightness, coughing, and shortness of breath for about one week.. Echo showed LVEF 20-25% with grade 3 DD and mild to moderate MR, cardiology consulted. The pt denied any history of chest pain. He improved with diuresis. Cardiac catheterization demonstrates moderate mid LAD disease that is best  managed medically, mildly elevated pulmonary  pressures. Cardiomyopathy is felt to be nonischemic in etiology.  Assessment & Plan    1. Acute (on chronic?) combined heart failure with LVEF 20-25% and grade 3 diastolic dysfunction. Nonischemic etiology suspected with cardiac catheterization demonstrating only moderate stenosis within the LAD that is best managed medically. He is clinically stable at this time.  2. History of substance abuse including cocaine, reportedly without drug use since October 2017. UDS negative.  3. Type 2 diabetes mellitus, poorly controlled with hemoglobin A1c 13.  4. Hypertension at presentation, blood pressure control improved on medical therapy.  5. Suspected pneumonia, currently on azithromycin per primary team.  6. Leukopenia, WBC 2.6 up to 3.2.  Patient stable from a cardiac perspective for discharge home. Recommend continuing aspirin, Lipitor, Coreg, lisinopril, Lasix, and potassium supplements. He should have follow-up with Dr. Jens Som or APP in the next 7-10 days.  Signed, Nona Dell, MD  05/02/2016, 8:02 AM

## 2016-05-03 ENCOUNTER — Encounter (HOSPITAL_COMMUNITY): Payer: Self-pay | Admitting: Internal Medicine

## 2016-05-03 MED FILL — LANTUS 100 UNITS/ML VIAL: 100 | 28 days supply | Qty: 10 | Fill #0

## 2016-05-03 MED FILL — ONE TOUCH ULTRA TEST STRIPS: 25 days supply | Qty: 100 | Fill #0

## 2016-05-05 ENCOUNTER — Telehealth: Payer: Self-pay | Admitting: Cardiology

## 2016-05-05 NOTE — Telephone Encounter (Signed)
Closed Encounter  °

## 2016-05-06 NOTE — Progress Notes (Deleted)
HPI: Follow-up cardiomyopathy. Recently admitted with CHF symptoms and possible pneumonia. Echocardiogram January 2018 showed ejection fraction 20-25%, restrictive filling, mild to moderate mitral regurgitation and moderately elevated pulmonary pressure. Cardiac catheterization January 2018 showed a 50% mid LAD lesion with no other coronary disease noted. Patient started on medical therapy including ACE inhibitor, beta blocker, Lasix and potassium. Note TSH September 2017 normal. Since discharge,    Current Outpatient Prescriptions  Medication Sig Dispense Refill  . aspirin 81 MG chewable tablet Chew 1 tablet (81 mg total) by mouth daily. 30 tablet 0  . atorvastatin (LIPITOR) 40 MG tablet Take 1 tablet (40 mg total) by mouth daily at 6 PM. 30 tablet 0  . blood glucose meter kit and supplies Dispense based on patient and insurance preference. Use up to four times daily as directed. (FOR ICD-9 250.00, 250.01). 1 each 0  . carvedilol (COREG) 12.5 MG tablet Take 1 tablet (12.5 mg total) by mouth 2 (two) times daily with a meal. 60 tablet 0  . furosemide (LASIX) 20 MG tablet Take 1 tablet (20 mg total) by mouth daily. 30 tablet 0  . insulin glargine (LANTUS) 100 UNIT/ML injection Inject 0.15 mLs (15 Units total) into the skin at bedtime. 20 mL 0  . Insulin Syringe-Needle U-100 (INSULIN SYRINGE .3CC/31GX5/16") 31G X 5/16" 0.3 ML MISC Use with lantus, once daily 30 each 0  . lisinopril (PRINIVIL,ZESTRIL) 10 MG tablet Take 1 tablet (10 mg total) by mouth daily. 30 tablet 0  . metFORMIN (GLUCOPHAGE) 1000 MG tablet Take 1 tablet (1,000 mg total) by mouth 2 (two) times daily with a meal. 60 tablet 0  . potassium chloride (K-DUR) 10 MEQ tablet Take 1 tablet (10 mEq total) by mouth daily. With lasix. 30 tablet 0  . Vitamin D, Ergocalciferol, (DRISDOL) 50000 units CAPS capsule Take 1 capsule (50,000 Units total) by mouth every 7 (seven) days. 5 capsule 0   No current facility-administered medications for  this visit.      Past Medical History:  Diagnosis Date  . Diabetes mellitus without complication [facility])     Past Surgical History:  Procedure Laterality Date  . CARDIAC CATHETERIZATION N/A 04/30/2016   Procedure: Right/Left Heart Cath and Coronary Angiography;  Surgeon: Nelva Bush, MD;  Location: [place] [facility];  Service: Cardiovascular;  Laterality: N/A;  . FINGER SURGERY      Social History   Social History  . Marital status: Married    Spouse name: N/A  . Number of children: N/A  . Years of education: N/A   Occupational History  . Not on file.   Social History Main Topics  . Smoking status: Current Every Day Smoker    Packs/day: 0.50    Years: 23.00    Types: Cigarettes  . Smokeless tobacco: Never Used  . Alcohol use Yes  . Drug use: Yes    Frequency: 3.0 times per week    Types: Cocaine, "Crack" cocaine  . Sexual activity: Not Currently   Other Topics Concern  . Not on file   Social History Narrative  . No narrative on file    Family History  Problem Relation Age of Onset  . Diabetes Other   . Hypertension Other   . Diabetes Mother   . Brain cancer Father   . Mental illness Brother   . Drug abuse Brother   . Diabetes Brother     ROS: no fevers or chills, productive cough, hemoptysis, dysphasia, odynophagia, melena, hematochezia,  dysuria, hematuria, rash, seizure activity, orthopnea, PND, pedal edema, claudication. Remaining systems are negative.  Physical Exam: Well-developed well-nourished in no acute distress.  Skin is warm and dry.  HEENT is normal.  Neck is supple.  Chest is clear to auscultation with normal expansion.  Cardiovascular exam is regular rate and rhythm.  Abdominal exam nontender or distended. No masses palpated. Extremities show no edema. neuro grossly intact  ECG

## 2016-05-14 ENCOUNTER — Ambulatory Visit (INDEPENDENT_AMBULATORY_CARE_PROVIDER_SITE_OTHER): Payer: Self-pay | Admitting: Cardiology

## 2016-05-14 ENCOUNTER — Encounter: Payer: Self-pay | Admitting: Cardiology

## 2016-05-14 ENCOUNTER — Ambulatory Visit: Payer: Self-pay | Admitting: Cardiology

## 2016-05-14 VITALS — BP 130/78 | HR 108 | Ht 72.0 in | Wt 157.2 lb

## 2016-05-14 DIAGNOSIS — I5022 Chronic systolic (congestive) heart failure: Secondary | ICD-10-CM

## 2016-05-14 DIAGNOSIS — I2510 Atherosclerotic heart disease of native coronary artery without angina pectoris: Secondary | ICD-10-CM

## 2016-05-14 DIAGNOSIS — I509 Heart failure, unspecified: Secondary | ICD-10-CM

## 2016-05-14 DIAGNOSIS — I10 Essential (primary) hypertension: Secondary | ICD-10-CM

## 2016-05-14 MED ORDER — SPIRONOLACTONE 25 MG PO TABS: 12.5000 mg | ORAL_TABLET | Freq: Every day | ORAL | 3 refills | Status: DC

## 2016-05-14 MED ORDER — LISINOPRIL 20 MG PO TABS: 20.0000 mg | ORAL_TABLET | Freq: Every day | ORAL | 3 refills | Status: DC

## 2016-05-14 NOTE — Patient Instructions (Signed)
Medication Instructions:   START SPIRONOLACTONE 12.5 MG ONCE DAILY= 1/2 OF THE 25 MG TABLET ONCE DAILY  INCREASE LISINOPRIL TO 20 MG ONCE DAILY= 2 OF THE 10 MG TABLETS ONCE DAILY  Labwork:  Your physician recommends that you return for lab work in ONE WEEK  Follow-Up:  Your physician recommends that you schedule a follow-up appointment in: 2 WEEKS WITH APP  Your physician recommends that you schedule a follow-up appointment in: 3 MONTHS WITH DR Jens Som

## 2016-05-14 NOTE — Progress Notes (Addendum)
HPI: Follow-up cardiomyopathy and congestive heart failure. Patient admitted in January 2018 with dyspnea felt to be a combination of pneumonia and congestive heart failure. Echocardiogram showed severely reduced LV function with ejection fraction 20-25%, restrictive filling, mild left ventricular enlargement, mild to moderate mitral regurgitation and moderately elevated pulmonary pressures. Cardiac catheterization January 2018 showed 50% LAD but otherwise no obstructive coronary disease. Patient was treated with antibiotics and diuretics with improvement. Since discharge, he notes some dyspnea on exertion but improved. No orthopnea, PND, pedal edema, chest pain or syncope. He has a dry cough which is also improving.  Current Outpatient Prescriptions  Medication Sig Dispense Refill  . aspirin 81 MG chewable tablet Chew 1 tablet (81 mg total) by mouth daily. 30 tablet 0  . atorvastatin (LIPITOR) 40 MG tablet Take 1 tablet (40 mg total) by mouth daily at 6 PM. 30 tablet 0  . blood glucose meter kit and supplies Dispense based on patient and insurance preference. Use up to four times daily as directed. (FOR ICD-9 250.00, 250.01). 1 each 0  . carvedilol (COREG) 12.5 MG tablet Take 1 tablet (12.5 mg total) by mouth 2 (two) times daily with a meal. 60 tablet 0  . furosemide (LASIX) 20 MG tablet Take 1 tablet (20 mg total) by mouth daily. 30 tablet 0  . insulin glargine (LANTUS) 100 UNIT/ML injection Inject 0.15 mLs (15 Units total) into the skin at bedtime. 20 mL 0  . Insulin Syringe-Needle U-100 (INSULIN SYRINGE .3CC/31GX5/16") 31G X 5/16" 0.3 ML MISC Use with lantus, once daily 30 each 0  . lisinopril (PRINIVIL,ZESTRIL) 10 MG tablet Take 1 tablet (10 mg total) by mouth daily. 30 tablet 0  . metFORMIN (GLUCOPHAGE) 1000 MG tablet Take 1 tablet (1,000 mg total) by mouth 2 (two) times daily with a meal. 60 tablet 0  . potassium chloride (K-DUR) 10 MEQ tablet Take 1 tablet (10 mEq total) by mouth  daily. With lasix. 30 tablet 0  . Vitamin D, Ergocalciferol, (DRISDOL) 50000 units CAPS capsule Take 1 capsule (50,000 Units total) by mouth every 7 (seven) days. 5 capsule 0   No current facility-administered medications for this visit.      Past Medical History:  Diagnosis Date  . Diabetes mellitus without complication [facility])     Past Surgical History:  Procedure Laterality Date  . CARDIAC CATHETERIZATION N/A 04/30/2016   Procedure: Right/Left Heart Cath and Coronary Angiography;  Surgeon: Nelva Bush, MD;  Location: [place] [facility];  Service: Cardiovascular;  Laterality: N/A;  . FINGER SURGERY      Social History   Social History  . Marital status: Married    Spouse name: N/A  . Number of children: N/A  . Years of education: N/A   Occupational History  . Not on file.   Social History Main Topics  . Smoking status: Current Every Day Smoker    Packs/day: 0.50    Years: 23.00    Types: Cigarettes  . Smokeless tobacco: Never Used  . Alcohol use Yes  . Drug use: Yes    Frequency: 3.0 times per week    Types: Cocaine, "Crack" cocaine  . Sexual activity: Not Currently   Other Topics Concern  . Not on file   Social History Narrative  . No narrative on file    Family History  Problem Relation Age of Onset  . Diabetes Other   . Hypertension Other   . Diabetes Mother   . Brain cancer Father   .  Mental illness Brother   . Drug abuse Brother   . Diabetes Brother     ROS: no fevers or chills, productive cough, hemoptysis, dysphasia, odynophagia, melena, hematochezia, dysuria, hematuria, rash, seizure activity, orthopnea, PND, pedal edema, claudication. Remaining systems are negative.  Physical Exam: Well-developed well-nourished in no acute distress.  Skin is warm and dry.  HEENT is normal.  Neck is supple.  Chest is clear to auscultation with normal expansion.  Cardiovascular exam is regular rate and rhythm. No murmur Abdominal exam nontender or  distended. No masses palpated. Extremities show no edema. neuro grossly intact  ECG-Sinus tachycardia at a rate of 105. Left ventricular hypertrophy.  A/P  1 Nonischemic cardiomyopathy- etiology unclear. Cardiac catheterization did not reveal obstructive coronary disease as the cause. He denies significant alcohol use. Check TSH. Continue carvedilol. Increase lisinopril to 20 mg daily. Check potassium and renal function in 1 week. He will follow-up with APP in 2 weeks for medication titration. Once fully titrated would repeat echocardiogram 3 months later to see if LV function improved. If not would need to consider ICD. I have not added entresto as pt has no insurance and finances an issue.  2 coronary artery disease-continue aspirin and statin.  3 chronic systolic congestive heart failure-patient notes some dyspnea on exertion but improving since discharge. He is not markedly volume overloaded on examination. Continue Lasix. Add spironolactone 12.5 mg check potassium and renal function in 1 week.   4 hypertension-Blood pressure controlled. Continue present medications.   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 I encouraged him to avoid cocaine.   Kirk Ruths, MD

## 2016-05-18 NOTE — Addendum Note (Signed)
Addended by: Barrie Dunker on: 05/18/2016 08:07 AM   Modules accepted: Orders

## 2016-05-28 ENCOUNTER — Ambulatory Visit (INDEPENDENT_AMBULATORY_CARE_PROVIDER_SITE_OTHER): Payer: Self-pay | Admitting: Physician Assistant

## 2016-05-28 ENCOUNTER — Encounter: Payer: Self-pay | Admitting: Physician Assistant

## 2016-05-28 VITALS — BP 136/72 | HR 90 | Ht 72.0 in | Wt 162.0 lb

## 2016-05-28 DIAGNOSIS — Z794 Long term (current) use of insulin: Secondary | ICD-10-CM

## 2016-05-28 DIAGNOSIS — I429 Cardiomyopathy, unspecified: Secondary | ICD-10-CM

## 2016-05-28 DIAGNOSIS — Z79899 Other long term (current) drug therapy: Secondary | ICD-10-CM

## 2016-05-28 DIAGNOSIS — I509 Heart failure, unspecified: Secondary | ICD-10-CM

## 2016-05-28 DIAGNOSIS — E118 Type 2 diabetes mellitus with unspecified complications: Secondary | ICD-10-CM

## 2016-05-28 DIAGNOSIS — I428 Other cardiomyopathies: Secondary | ICD-10-CM

## 2016-05-28 MED ORDER — FUROSEMIDE 20 MG PO TABS: 20.0000 mg | ORAL_TABLET | Freq: Every day | ORAL | 3 refills | Status: DC

## 2016-05-28 MED ORDER — SPIRONOLACTONE 25 MG PO TABS: 25.0000 mg | ORAL_TABLET | Freq: Every day | ORAL | 3 refills | Status: DC

## 2016-05-28 MED ORDER — CARVEDILOL 25 MG PO TABS: 25.0000 mg | ORAL_TABLET | Freq: Two times a day (BID) | ORAL | 3 refills | Status: DC

## 2016-05-28 NOTE — Patient Instructions (Signed)
Medication Instructions:  INCREASE carvedilol to 25mg  (1 tablet) two times a day. INCREASE spironolactone to 25mg  (1 tablet) one time a day.  Labwork: Have lab work today [employer])  Testing/Procedures: Your physician has requested that you have an echocardiogram in 3 MONTHS. Echocardiography is a painless test that uses sound waves to create images of your heart. It provides your doctor with information about the size and shape of your heart and how well your heart's chambers and valves are working. This procedure takes approximately one hour. There are no restrictions for this procedure.    Follow-Up: Your physician recommends that you schedule a follow-up appointment in: 3-4 weeks with Azalee Course PA   Any Other Special Instructions Will Be Listed Below (If Applicable).     If you need a refill on your cardiac medications before your next appointment, please call your pharmacy.

## 2016-05-28 NOTE — Progress Notes (Signed)
Cardiology Office Note    Date:  05/28/2016   ID:  James [name], DOB March 14, 1961, MRN [id number]  PCP:  Carmie Kanner, NP  Cardiologist:  Dr. Stanford Breed  Chief Complaint  Patient presents with  . Follow-up    seen for Dr. Stanford Breed, medication titration    History of Present Illness:  James [name] is a 56 y.o. male with PMH of DM II, NICM, cardiomegaly, congestive heart failure and h/o cocaine use. He was admitted in January 2018 with dyspnea felt to be a combination of pneumonia and congestive heart failure. Echocardiogram obtained on 04/27/2016 showed EF 20-25%, diffuse hypokinesis, grade 3 diastolic dysfunction with reversible restrictive pattern, mild to moderate MR, PA peak pressure 48 mmHg. Cardiac catheterization performed on 04/30/2016 showed only 50% moderate mid LAD stenosis, otherwise minimal CAD. At the time of cardiac cath, she had normal right heart filling pressure. Cardiac index 2.3, cardiac output 4.3. He was diagnosed with nonischemic cardiomyopathy and was placed on medical therapy. He was released from the hospital on aspirin, Lipitor, carvedilol, Lasix and lisinopril. During the post hospital follow-up, Dr. Stanford Breed increased his lisinopril to 20 mg daily, spironolactone 12.5 mg was added as well.  He presents today for medication titration. He says he has not touched cocaine since last Halloween. She denies any significant lower extremity edema, orthopnea or paroxysmal nocturnal dyspnea. He is currently on lisinopril 20 mg daily, we plan to increase spironolactone to 25 mg daily. We will also increase his carvedilol to 25 mg twice a day. Unfortunately he did not obtain basic metabolic panel after started on spironolactone as recently instructed. We will obtain basic metabolic panel today. I will see him back in 3-4 weeks for blood pressure monitoring. He will need a three-month echocardiogram to reassess ejection fraction, if his EF is still low, he will need a EP referral  for evaluation of ICD.   Past Medical History:  Diagnosis Date  . Chronic systolic heart failure ([place])   . Diabetes mellitus without complication ([place])   . NICM (nonischemic cardiomyopathy) ([place]) EF 20-25%     Past Surgical History:  Procedure Laterality Date  . CARDIAC CATHETERIZATION N/A 04/30/2016   Procedure: Right/Left Heart Cath and Coronary Angiography;  Surgeon: Nelva Bush, MD;  Location: [place];  Service: Cardiovascular;  Laterality: N/A;  . FINGER SURGERY      Current Medications: Outpatient Medications Prior to Visit  Medication Sig Dispense Refill  . aspirin 81 MG chewable tablet Chew 1 tablet (81 mg total) by mouth daily. 30 tablet 0  . atorvastatin (LIPITOR) 40 MG tablet Take 1 tablet (40 mg total) by mouth daily at 6 PM. 30 tablet 0  . blood glucose meter kit and supplies Dispense based on patient and insurance preference. Use up to four times daily as directed. (FOR ICD-9 250.00, 250.01). 1 each 0  . Insulin Syringe-Needle U-100 (INSULIN SYRINGE .3CC/31GX5/16") 31G X 5/16" 0.3 ML MISC Use with lantus, once daily 30 each 0  . lisinopril (PRINIVIL,ZESTRIL) 20 MG tablet Take 1 tablet (20 mg total) by mouth daily. 90 tablet 3  . metFORMIN (GLUCOPHAGE) 1000 MG tablet Take 1 tablet (1,000 mg total) by mouth 2 (two) times daily with a meal. 60 tablet 0  . potassium chloride (K-DUR) 10 MEQ tablet Take 1 tablet (10 mEq total) by mouth daily. With lasix. 30 tablet 0  . Vitamin D, Ergocalciferol, (DRISDOL) 50000 units CAPS capsule Take 1 capsule (50,000 Units total) by mouth every 7 (  seven) days. 5 capsule 0  . carvedilol (COREG) 12.5 MG tablet Take 1 tablet (12.5 mg total) by mouth 2 (two) times daily with a meal. 60 tablet 0  . furosemide (LASIX) 20 MG tablet Take 1 tablet (20 mg total) by mouth daily. 30 tablet 0  . spironolactone (ALDACTONE) 25 MG tablet Take 0.5 tablets (12.5 mg total) by mouth daily. 45 tablet 3  . insulin glargine (LANTUS) 100 UNIT/ML  injection Inject 0.15 mLs (15 Units total) into the skin at bedtime. (Patient not taking: Reported on 05/28/2016) 20 mL 0   No facility-administered medications prior to visit.      Allergies:   Patient has no known allergies.   Social History   Social History  . Marital status: Married    Spouse name: N/A  . Number of children: N/A  . Years of education: N/A   Social History Main Topics  . Smoking status: Former Smoker    Packs/day: 0.50    Years: 23.00    Types: Cigarettes    Quit date: 04/26/2016  . Smokeless tobacco: Never Used  . Alcohol use No     Comment: none as of 04/26/16  . Drug use: No     Comment: none as of 04/26/16  . Sexual activity: Not Currently   Other Topics Concern  . None   Social History Narrative  . None     Family History:  The patient's family history includes Brain cancer in his father; Diabetes in his brother, mother, and other; Drug abuse in his brother; Hypertension in his other; Mental illness in his brother.   ROS:   Please see the history of present illness.    ROS All other systems reviewed and are negative.   PHYSICAL EXAM:   VS:  BP 136/72   Pulse 90   Ht 6' (1.829 m)   Wt 162 lb (73.5 kg)   SpO2 97%   BMI 21.97 kg/m    GEN: Well nourished, well developed, in no acute distress  HEENT: normal  Neck: no JVD, carotid bruits, or masses Cardiac: RRR; no murmurs, rubs, or gallops,no edema  Respiratory:  clear to auscultation bilaterally, normal work of breathing GI: soft, nontender, nondistended, + BS MS: no deformity or atrophy  Skin: warm and dry, no rash Neuro:  Alert and Oriented x 3, Strength and sensation are intact Psych: euthymic mood, full affect  Wt Readings from Last 3 Encounters:  05/28/16 162 lb (73.5 kg)  05/14/16 157 lb 3.2 oz (71.3 kg)  05/02/16 148 lb 4.8 oz (67.3 kg)      Studies/Labs Reviewed:   EKG:  EKG is not ordered today.    Recent Labs: 12/13/2015: TSH 1.923 04/26/2016: ALT 44; B Natriuretic  Peptide 236.0 05/02/2016: BUN 13; Creatinine, Ser 0.66; Hemoglobin 12.5; Platelets 321; Potassium 4.2; Sodium 134   Lipid Panel    Component Value Date/Time   CHOL 146 04/28/2016 0338   TRIG 71 04/28/2016 0338   HDL 61 04/28/2016 0338   CHOLHDL 2.4 04/28/2016 0338   VLDL 14 04/28/2016 0338   LDLCALC 71 04/28/2016 0338    Additional studies/ records that were reviewed today include:   Echo 04/27/2016 LV EF: 20% -   25%  - Left ventricle: The cavity size was mildly dilated. Wall   thickness was normal. Systolic function was severely reduced. The   estimated ejection fraction was in the range of 20% to 25%.   Diffuse hypokinesis. There was fusion of early and  atrial   contributions to ventricular filling. Doppler parameters are   consistent with a reversible restrictive pattern, indicative of   decreased left ventricular diastolic compliance and/or increased   left atrial pressure (grade 3 diastolic dysfunction). - Mitral valve: There was mild to moderate regurgitation. - Pulmonary arteries: Systolic pressure was moderately increased.   PA peak pressure: 48 mm Hg (S). - Pericardium, extracardiac: A trivial pericardial effusion was   identified.    Cath 04/30/2016 Conclusion   Conclusions: 1. Moderate mid LAD stenosis (50%) with otherwise minimal CAD, consistent with nonischemic cardiomyopathy. 2. Normal right heart filling pressures. 3. Upper normal to mildly elevated left heart filling pressures. 4. Mild pulmonary hypertension. 5. Low normal to mildly decreased Fick cardiac output/index.  Recommendations: 1. Continue medical therapy for acute systolic and diastolic heart failure. 2. Medical management of nonobstructive CAD, including statin therapy to prevent progression of disease.     ASSESSMENT:    1. NICM (nonischemic cardiomyopathy) ([place])   2. Medication management   3. Cardiomyopathy, unspecified type ([place])   4. Congestive heart failure, unspecified  congestive heart failure chronicity, unspecified congestive heart failure type ([place])   5. Controlled type 2 diabetes mellitus with complication, with long-term current use of insulin ([facility])      PLAN:  In order of problems listed above:  1. NICM with baseline EF 20-25%: Underwent cardiac catheterization on 04/30/2016 which revealed only a 50% LAD lesion is not explain the degree of his decreased EF. He is lisinopril was increased to 20 mg during the last visit. I will increase his spironolactone to 25 mg daily and his carvedilol to 25 mg twice a day. His heart failure medications currently maximized. He is unable to afford Entresto  2. Chronic systolic heart failure: He does not appears to be volume overloaded on physical exam, there is no lower extremity edema, orthopnea or paroxysmal nocturnal dyspnea. He is doing well on the current dose of 20 mg daily Lasix. We have refilled his medication. He is pending basic metabolic panel after started on spironolactone we will also assess his renal function as well.  3. DM II: On insulin at home.     Medication Adjustments/Labs and Tests Ordered: Current medicines are reviewed at length with the patient today.  Concerns regarding medicines are outlined above.  Medication changes, Labs and Tests ordered today are listed in the Patient Instructions below. Patient Instructions  Medication Instructions:  INCREASE carvedilol to 33m (1 tablet) two times a day. INCREASE spironolactone to 274m(1 tablet) one time a day.  Labwork: Have lab work today (BArtist Testing/Procedures: Your physician has requested that you have an echocardiogram in 3 MONTHS. Echocardiography is a painless test that uses sound waves to create images of your heart. It provides your doctor with information about the size and shape of your heart and how well your heart's chambers and valves are working. This procedure takes approximately one hour. There are no restrictions for this  procedure.    Follow-Up: Your physician recommends that you schedule a follow-up appointment in: 3-4 weeks with HaAlmyra DeforestA   Any Other Special Instructions Will Be Listed Below (If Applicable).     If you need a refill on your cardiac medications before your next appointment, please call your pharmacy.      SiHilbert CorriganPAUtah2/16/2018 4:30 PM    [place]  [id number]: [phone]: [phone]

## 2016-06-16 LAB — BASIC METABOLIC PANEL
BUN: 12 mg/dL (ref 7–25)
CHLORIDE: 101 mmol/L (ref 98–110)
CO2: 27 mmol/L (ref 20–31)
Calcium: 9 mg/dL (ref 8.6–10.3)
Creat: 0.97 mg/dL (ref 0.70–1.33)
Glucose, Bld: 256 mg/dL — ABNORMAL HIGH (ref 65–99)
POTASSIUM: 4.4 mmol/L (ref 3.5–5.3)
SODIUM: 137 mmol/L (ref 135–146)

## 2016-06-16 LAB — TSH: TSH: 1.15 m[IU]/L (ref 0.40–4.50)

## 2016-06-18 ENCOUNTER — Ambulatory Visit: Payer: Self-pay | Admitting: Physician Assistant

## 2016-06-25 ENCOUNTER — Ambulatory Visit: Payer: Self-pay | Admitting: Physician Assistant

## 2016-06-25 ENCOUNTER — Encounter: Payer: Self-pay | Admitting: *Deleted

## 2016-06-25 NOTE — Progress Notes (Deleted)
Cardiology Office Note    Date:  06/25/2016   ID:  JASSON SIEGMANN, DOB 1961/04/02, MRN [id number]  PCP:  Carmie Kanner, NP  Cardiologist:  Dr. Stanford Breed   No chief complaint on file.   History of Present Illness:  James [name] is a 56 y.o. male with PMH of DM II, substance abuse, chronic systolic heart failure and NICM with baseline EF 20-25%. He was admitted in January 2018 with dyspnea felt to be a combination of pneumonia and congestive heart failure. Echocardiogram at that time showed severely reduced LV function with EF 20-25%, restrictive filling, mild LV enlargement, mild-to-moderate mitral regurgitation and moderately elevated pulmonary pressure. Cardiac catheterization performed in January 2018 showed 50% LAD but otherwise nonobstructive coronary artery disease to explain his LV dysfunction. He was treated with antibiotic and diuretic with improvement.  He was last seen in office on 05/14/2016. His lisinopril was increased to 20 mg daily. He was also on carvedilol as well. We plan to up titrate heart failure medication and a repeat echocardiogram in 3 month to see if his LV function would improve. Delene Loll was considered, however he has no insurance and the finance is an issue.   No EKG Past Medical History:  Diagnosis Date  . Chronic systolic heart failure ([place])   . Diabetes mellitus without complication ([place])   . NICM (nonischemic cardiomyopathy) ([place]) EF 20-25%     Past Surgical History:  Procedure Laterality Date  . CARDIAC CATHETERIZATION N/A 04/30/2016   Procedure: Right/Left Heart Cath and Coronary Angiography;  Surgeon: Nelva Bush, MD;  Location: [facility];  Service: Cardiovascular;  Laterality: N/A;  . FINGER SURGERY      Current Medications: Outpatient Medications Prior to Visit  Medication Sig Dispense Refill  . aspirin 81 MG chewable tablet Chew 1 tablet (81 mg total) by mouth daily. 30 tablet 0  . atorvastatin (LIPITOR) 40 MG tablet Take 1  tablet (40 mg total) by mouth daily at 6 PM. 30 tablet 0  . blood glucose meter kit and supplies Dispense based on patient and insurance preference. Use up to four times daily as directed. (FOR ICD-9 250.00, 250.01). 1 each 0  . carvedilol (COREG) 25 MG tablet Take 1 tablet (25 mg total) by mouth 2 (two) times daily with a meal. 180 tablet 3  . furosemide (LASIX) 20 MG tablet Take 1 tablet (20 mg total) by mouth daily. 90 tablet 3  . Insulin Aspart (NOVOLOG [place]) Inject into the skin. Sliding Scale    . insulin glargine (LANTUS) 100 UNIT/ML injection Inject into the skin at bedtime. Sliding scale    . Insulin Syringe-Needle U-100 (INSULIN SYRINGE .3CC/31GX5/16") 31G X 5/16" 0.3 ML MISC Use with lantus, once daily 30 each 0  . lisinopril (PRINIVIL,ZESTRIL) 20 MG tablet Take 1 tablet (20 mg total) by mouth daily. 90 tablet 3  . metFORMIN (GLUCOPHAGE) 1000 MG tablet Take 1 tablet (1,000 mg total) by mouth 2 (two) times daily with a meal. 60 tablet 0  . potassium chloride (K-DUR) 10 MEQ tablet Take 1 tablet (10 mEq total) by mouth daily. With lasix. 30 tablet 0  . spironolactone (ALDACTONE) 25 MG tablet Take 1 tablet (25 mg total) by mouth daily. 90 tablet 3   No facility-administered medications prior to visit.      Allergies:   Patient has no known allergies.   Social History   Social History  . Marital status: Married    Spouse name: N/A  .  Number of children: N/A  . Years of education: N/A   Social History Main Topics  . Smoking status: Former Smoker    Packs/day: 0.50    Years: 23.00    Types: Cigarettes    Quit date: 04/26/2016  . Smokeless tobacco: Never Used  . Alcohol use No     Comment: none as of 04/26/16  . Drug use: No     Comment: none as of 04/26/16  . Sexual activity: Not Currently   Other Topics Concern  . Not on file   Social History Narrative  . No narrative on file     Family History:  The patient's ***family history includes Brain cancer in his father;  Diabetes in his brother, mother, and other; Drug abuse in his brother; Hypertension in his other; Mental illness in his brother.   ROS:   Please see the history of present illness.    ROS All other systems reviewed and are negative.   PHYSICAL EXAM:   VS:  There were no vitals taken for this visit.   GEN: Well nourished, well developed, in no acute distress  HEENT: normal  Neck: no JVD, carotid bruits, or masses Cardiac: ***RRR; no murmurs, rubs, or gallops,no edema  Respiratory:  clear to auscultation bilaterally, normal work of breathing GI: soft, nontender, nondistended, + BS MS: no deformity or atrophy  Skin: warm and dry, no rash Neuro:  Alert and Oriented x 3, Strength and sensation are intact Psych: euthymic mood, full affect  Wt Readings from Last 3 Encounters:  05/28/16 162 lb (73.5 kg)  05/14/16 157 lb 3.2 oz (71.3 kg)  05/02/16 148 lb 4.8 oz (67.3 kg)      Studies/Labs Reviewed:   EKG:  EKG is*** ordered today.  The ekg ordered today demonstrates ***  Recent Labs: 04/26/2016: ALT 44; B Natriuretic Peptide 236.0 05/02/2016: Hemoglobin 12.5; Platelets 321 06/15/2016: BUN 12; Creat 0.97; Potassium 4.4; Sodium 137; TSH 1.15   Lipid Panel    Component Value Date/Time   CHOL 146 04/28/2016 0338   TRIG 71 04/28/2016 0338   HDL 61 04/28/2016 0338   CHOLHDL 2.4 04/28/2016 0338   VLDL 14 04/28/2016 0338   LDLCALC 71 04/28/2016 0338    Additional studies/ records that were reviewed today include:  ***    ASSESSMENT:    No diagnosis found.   PLAN:  In order of problems listed above:  1. ***    Medication Adjustments/Labs and Tests Ordered: Current medicines are reviewed at length with the patient today.  Concerns regarding medicines are outlined above.  Medication changes, Labs and Tests ordered today are listed in the Patient Instructions below. There are no Patient Instructions on file for this visit.   Hilbert Corrigan, [place]  06/25/2016 1:08 PM      [facility] [place], [place], [place]  [address] Phone: [phone]; Fax: [phone]

## 2016-07-09 ENCOUNTER — Other Ambulatory Visit: Payer: Self-pay

## 2016-07-09 ENCOUNTER — Ambulatory Visit (INDEPENDENT_AMBULATORY_CARE_PROVIDER_SITE_OTHER): Payer: Self-pay | Admitting: Physician Assistant

## 2016-07-09 ENCOUNTER — Encounter: Payer: Self-pay | Admitting: Physician Assistant

## 2016-07-09 VITALS — BP 126/72 | HR 94 | Ht 72.0 in | Wt 164.0 lb

## 2016-07-09 DIAGNOSIS — I429 Cardiomyopathy, unspecified: Secondary | ICD-10-CM

## 2016-07-09 DIAGNOSIS — Z794 Long term (current) use of insulin: Secondary | ICD-10-CM

## 2016-07-09 DIAGNOSIS — E119 Type 2 diabetes mellitus without complications: Secondary | ICD-10-CM

## 2016-07-09 DIAGNOSIS — I428 Other cardiomyopathies: Secondary | ICD-10-CM

## 2016-07-09 MED ORDER — ATORVASTATIN CALCIUM 40 MG PO TABS: 40.0000 mg | ORAL_TABLET | Freq: Every day | ORAL | 0 refills | Status: DC

## 2016-07-09 NOTE — Patient Instructions (Addendum)
Medication Instructions: Increase spironolactone to 25 mg daily.  STOP Potassium.   Labwork: Your physician recommends that you return for lab work: BMET in 2 weeks.   Follow-Up: Your physician recommends that you schedule a follow-up appointment in: May or June after echo with Dr. Jens Som. (Cancel May 3 appt and reschedule to later date)  --If unable to schedule appt in May or June, reschedule echo. Echo should be done a few days prior to seeing Dr. Jens Som.--  If you need a refill on your cardiac medications before your next appointment, please call your pharmacy.

## 2016-07-09 NOTE — Progress Notes (Signed)
 Cardiology Office Note    Date:  07/09/2016   ID:  James [name], DOB 12/01/1960, MRN [id number]  PCP:  PLACEY,MARY H, NP  Cardiologist:  Dr. Crenshaw  Chief Complaint  Patient presents with  . Follow-up    seen for Dr. Crenshaw    History of Present Illness:  James [name] is a 56 y.o. male with PMH of DM II, NICM, cardiomegaly, congestive heart failure and h/o cocaine use. He was admitted in January 2018 with dyspnea felt to be a combination of pneumonia and congestive heart failure. Echocardiogram obtained on 04/27/2016 showed EF 20-25%, diffuse hypokinesis, [name] 3 diastolic dysfunction with reversible restrictive pattern, mild to moderate MR, PA peak pressure 48 mmHg. Cardiac catheterization performed on 04/30/2016 showed only 50% moderate mid LAD stenosis, otherwise minimal CAD. At the time of cardiac cath, she had normal right heart filling pressure. Cardiac index 2.3, cardiac output 4.3. He was diagnosed with nonischemic cardiomyopathy and was placed on medical therapy. He was released from the hospital on aspirin, Lipitor, carvedilol, Lasix and lisinopril. During the post hospital follow-up, Dr. Crenshaw increased his lisinopril to 20 mg daily, spironolactone 12.5 mg was added as well.  Last saw the patient in the clinic on 05/28/2016, he assess he has not touched cocaine since last home to [place]. I increased his spironolactone to 25 mg daily and also his carvedilol to 25 mg twice a day. He presents today for office visit and monitoring of his blood pressure after recent medication changes. I recommended a repeat echocardiogram in about 3 month, if his ejection fraction still low at that time, we will consider refer him to electrophysiology service for consideration of ICD placement.  Patient presents today for follow-up, apparently he has ran out of Lipitor. He is also not take any potassium supplement for over a month either. On further questioning, he did not take the increased  dose of spironolactone 25 mg daily. He is still on the previous 12.5 mg spironolactone. I have asked him to start taking 25 mg spironolactone daily basis. I will discontinue his potassium chloride supplement as he is on both lisinopril and higher dose of spironolactone by this point. Furthermore, he has not experienced any chest pain or shortness breath. He has been doing quite well. He did came off of Lasix for a week, however has since restarted on it. It seems he is repeat echocardiogram is after office visit with Dr. Crenshaw, and Dr. Crenshaw's clinic is full until July, we will try to reschedule his echocardiogram to a few days before his visit with Dr. Crenshaw. In the last few days, he has been having some uncontrolled blood sugar, with blood glucose running in the 400-500s range, he has since started on insulin. He is using sliding scale at this time. I asked him to followup with his primary care provider for further adjustment of insulin dose. Today, we did obtain blood glucose in the office. It was 303.   Past Medical History:  Diagnosis Date  . Chronic systolic heart failure ([facility])   . Diabetes mellitus without complication ([facility])   . NICM (nonischemic cardiomyopathy) ([facility]) EF 20-25%     Past Surgical History:  Procedure Laterality Date  . CARDIAC CATHETERIZATION N/A 04/30/2016   Procedure: Right/Left Heart Cath and Coronary Angiography;  Surgeon: [name] End, MD;  Location: [facility];  Service: Cardiovascular;  Laterality: N/A;  . FINGER SURGERY      Current Medications: Outpatient Medications Prior to Visit    Medication Sig Dispense Refill  . aspirin 81 MG chewable tablet Chew 1 tablet (81 mg total) by mouth daily. 30 tablet 0  . blood glucose meter kit and supplies Dispense based on patient and insurance preference. Use up to four times daily as directed. (FOR ICD-9 250.00, 250.01). 1 each 0  . carvedilol (COREG) 25 MG tablet Take 1 tablet (25 mg total) by mouth 2 (two)  times daily with a meal. 180 tablet 3  . furosemide (LASIX) 20 MG tablet Take 1 tablet (20 mg total) by mouth daily. 90 tablet 3  . Insulin Aspart (NOVOLOG [place]) Inject into the skin. Sliding Scale    . insulin glargine (LANTUS) 100 UNIT/ML injection Inject into the skin at bedtime. Sliding scale    . Insulin Syringe-Needle U-100 (INSULIN SYRINGE .3CC/31GX5/16") 31G X 5/16" 0.3 ML MISC Use with lantus, once daily 30 each 0  . lisinopril (PRINIVIL,ZESTRIL) 20 MG tablet Take 1 tablet (20 mg total) by mouth daily. 90 tablet 3  . metFORMIN (GLUCOPHAGE) 1000 MG tablet Take 1 tablet (1,000 mg total) by mouth 2 (two) times daily with a meal. 60 tablet 0  . spironolactone (ALDACTONE) 25 MG tablet Take 1 tablet (25 mg total) by mouth daily. 90 tablet 3  . atorvastatin (LIPITOR) 40 MG tablet Take 1 tablet (40 mg total) by mouth daily at 6 PM. 30 tablet 0  . potassium chloride (K-DUR) 10 MEQ tablet Take 1 tablet (10 mEq total) by mouth daily. With lasix. 30 tablet 0   No facility-administered medications prior to visit.      Allergies:   Patient has no known allergies.   Social History   Social History  . Marital status: Married    Spouse name: N/A  . Number of children: N/A  . Years of education: N/A   Social History Main Topics  . Smoking status: Former Smoker    Packs/day: 0.50    Years: 23.00    Types: Cigarettes    Quit date: 04/26/2016  . Smokeless tobacco: Never Used  . Alcohol use No     Comment: none as of 04/26/16  . Drug use: No     Comment: none as of 04/26/16  . Sexual activity: Not Currently   Other Topics Concern  . None   Social History Narrative  . None     Family History:  The patient's family history includes Brain cancer in his father; Diabetes in his brother, mother, and other; Drug abuse in his brother; Hypertension in his other; Mental illness in his brother.   ROS:   Please see the history of present illness.    ROS All other systems reviewed and are  negative.   PHYSICAL EXAM:   VS:  BP 126/72   Pulse 94   Ht 6' (1.829 m)   Wt 164 lb (74.4 kg)   BMI 22.24 kg/m    GEN: Well nourished, well developed, in no acute distress  HEENT: normal  Neck: no JVD, carotid bruits, or masses Cardiac: RRR; no murmurs, rubs, or gallops,no edema  Respiratory:  clear to auscultation bilaterally, normal work of breathing GI: soft, nontender, nondistended, + BS MS: no deformity or atrophy  Skin: warm and dry, no rash Neuro:  Alert and Oriented x 3, Strength and sensation are intact Psych: euthymic mood, full affect  Wt Readings from Last 3 Encounters:  07/09/16 164 lb (74.4 kg)  05/28/16 162 lb (73.5 kg)  05/14/16 157 lb 3.2 oz (71.3 kg)        Studies/Labs Reviewed:   EKG:  EKG is not ordered today.    Recent Labs: 04/26/2016: ALT 44; B Natriuretic Peptide 236.0 05/02/2016: Hemoglobin 12.5; Platelets 321 06/15/2016: BUN 12; Creat 0.97; Potassium 4.4; Sodium 137; TSH 1.15   Lipid Panel    Component Value Date/Time   CHOL 146 04/28/2016 0338   TRIG 71 04/28/2016 0338   HDL 61 04/28/2016 0338   CHOLHDL 2.4 04/28/2016 0338   VLDL 14 04/28/2016 0338   LDLCALC 71 04/28/2016 0338    Additional studies/ records that were reviewed today include:   Echo 04/27/2016 LV EF: 20% - 25%  - Left ventricle: The cavity size was mildly dilated. Wall thickness was normal. Systolic function was severely reduced. The estimated ejection fraction was in the range of 20% to 25%. Diffuse hypokinesis. There was fusion of early and atrial contributions to ventricular filling. Doppler parameters are consistent with a reversible restrictive pattern, indicative of decreased left ventricular diastolic compliance and/or increased left atrial pressure ([name] 3 diastolic dysfunction). - Mitral valve: There was mild to moderate regurgitation. - Pulmonary arteries: Systolic pressure was moderately increased. PA peak pressure: 48 mm Hg (S). -  Pericardium, extracardiac: A trivial pericardial effusion was identified.    Cath 04/30/2016 Conclusion   Conclusions: 1. Moderate mid LAD stenosis (50%) with otherwise minimal CAD, consistent with nonischemic cardiomyopathy. 2. Normal right heart filling pressures. 3. Upper normal to mildly elevated left heart filling pressures. 4. Mild pulmonary hypertension. 5. Low normal to mildly decreased F[place] cardiac output/index.  Recommendations: 1. Continue medical therapy for acute systolic and diastolic heart failure. 2. Medical management of nonobstructive CAD, including statin therapy to prevent progression of disease.     ASSESSMENT:    1. NICM (nonischemic cardiomyopathy) ([place])   2. Controlled type 2 diabetes mellitus without complication, with long-term current use of insulin ([facility])   3. Cardiomyopathy, unspecified type ([place])      PLAN:  In order of problems listed above:  1. NICM: Cardiac catheterization in January revealed only 50% LAD disease, does not explain the degree of his LV dysfunction. Baseline EF 25%. His heart failure medications been maximized, currently on carvedilol, lisinopril and spironolactone. Blood pressure stable. Plan for 3 months repeat echocardiogram, if EF still low, will need a referral to electrophysiology for consideration of ICD. We'll increase spironolactone to 25 mg daily. Obtain basic metabolic panel in 2 weeks.  2. Insulin dependent DM II: Recently, he has been having uncontrolled blood sugar in the 300-500 range, we did check blood glucose in the office today, it was 303. Further adjustment of insulin dose per primary care provider.    Medication Adjustments/Labs and Tests Ordered: Current medicines are reviewed at length with the patient today.  Concerns regarding medicines are outlined above.  Medication changes, Labs and Tests ordered today are listed in the Patient Instructions below. Patient Instructions  Medication  Instructions: Increase spironolactone to 25 mg daily.  STOP Potassium.   Labwork: Your physician recommends that you return for lab work: BMET in 2 weeks.   Follow-Up: Your physician recommends that you schedule a follow-up appointment in: May or June after echo with Dr. Stanford Breed. (Cancel May 3 appt and reschedule to later date)  --If unable to schedule appt in May or June, reschedule echo. Echo should be done a few days prior to seeing Dr. Stanford Breed.--  If you need a refill on your cardiac medications before your next appointment, please call your pharmacy.     Signed, Almyra Deforest, PA  07/09/2016 8:50 PM    [facility] [address], [place], [place]  [address] Phone: [phone]; Fax: [phone]   

## 2016-08-12 ENCOUNTER — Ambulatory Visit: Payer: Self-pay | Admitting: Cardiology

## 2016-08-27 ENCOUNTER — Other Ambulatory Visit (HOSPITAL_COMMUNITY): Payer: Self-pay

## 2016-09-22 ENCOUNTER — Encounter (HOSPITAL_COMMUNITY): Payer: Self-pay | Admitting: Emergency Medicine

## 2016-09-22 ENCOUNTER — Emergency Department (HOSPITAL_COMMUNITY): Payer: Self-pay

## 2016-09-22 ENCOUNTER — Emergency Department (HOSPITAL_COMMUNITY)
Admission: EM | Admit: 2016-09-22 | Discharge: 2016-09-22 | Disposition: A | Discharge status: Home / Self Care | Payer: Self-pay | Attending: Emergency Medicine | Admitting: Emergency Medicine

## 2016-09-22 DIAGNOSIS — Z87891 Personal history of nicotine dependence: Secondary | ICD-10-CM | POA: Insufficient documentation

## 2016-09-22 DIAGNOSIS — R2242 Localized swelling, mass and lump, left lower limb: Secondary | ICD-10-CM | POA: Insufficient documentation

## 2016-09-22 DIAGNOSIS — Y999 Unspecified external cause status: Secondary | ICD-10-CM | POA: Insufficient documentation

## 2016-09-22 DIAGNOSIS — Y929 Unspecified place or not applicable: Secondary | ICD-10-CM | POA: Insufficient documentation

## 2016-09-22 DIAGNOSIS — S9032XA Contusion of left foot, initial encounter: Secondary | ICD-10-CM | POA: Insufficient documentation

## 2016-09-22 DIAGNOSIS — Y939 Activity, unspecified: Secondary | ICD-10-CM | POA: Insufficient documentation

## 2016-09-22 DIAGNOSIS — X509XXA Other and unspecified overexertion or strenuous movements or postures, initial encounter: Secondary | ICD-10-CM | POA: Insufficient documentation

## 2016-09-22 DIAGNOSIS — Z79899 Other long term (current) drug therapy: Secondary | ICD-10-CM | POA: Insufficient documentation

## 2016-09-22 DIAGNOSIS — Z7982 Long term (current) use of aspirin: Secondary | ICD-10-CM | POA: Insufficient documentation

## 2016-09-22 DIAGNOSIS — Z794 Long term (current) use of insulin: Secondary | ICD-10-CM | POA: Insufficient documentation

## 2016-09-22 DIAGNOSIS — I5022 Chronic systolic (congestive) heart failure: Secondary | ICD-10-CM | POA: Insufficient documentation

## 2016-09-22 DIAGNOSIS — I110 Hypertensive heart disease with heart failure: Secondary | ICD-10-CM | POA: Insufficient documentation

## 2016-09-22 DIAGNOSIS — E119 Type 2 diabetes mellitus without complications: Secondary | ICD-10-CM | POA: Insufficient documentation

## 2016-09-22 MED ORDER — IBUPROFEN 600 MG PO TABS: 600.0000 mg | ORAL_TABLET | Freq: Four times a day (QID) | ORAL | 0 refills | Status: DC | PRN

## 2016-09-22 NOTE — Discharge Instructions (Signed)
I recommend taking ibuprofen as prescribed to help with pain and swelling. I also recommend resting, elevating and applying ice to your left foot for 15-20 minutes 3-4 times daily. Follow-up with your primary care provider if your symptoms have not improved within the next week. Please return to the Emergency Department if symptoms worsen or new onset of fever, redness, worsening swelling/ pain, decreased range of motion, unable to bear weight due to reported pain, new wound/rash.

## 2016-09-22 NOTE — ED Triage Notes (Signed)
Pt comes in with complaints of left foot/toe swelling.  No hx of gout.  Some erythema noted to area.  Pt states it feels like he hit his foot on something.  Ambulatory.  A&O x4.

## 2016-09-22 NOTE — ED Provider Notes (Signed)
[facility] DEPT Provider Note   CSN: [id number] Arrival date & time: 09/22/16  1854     History   Chief Complaint Chief Complaint  Patient presents with  . Foot Swelling    HPI James [name] is a 56 y.o. male.  HPI   Pt is a 56 yo male with PMH of DM, CHF who presents to the ED with complaint of left foot/toe pain, onset 2 days. Patient reports when he woke up in the morning 2 days ago he began having pain and mild swelling at the base of his left little toe. Patient denies any recent fall, trauma or known injury. Reports pain is worse with palpation or when bearing weight. He states he has been taking Aleve at home with mild intermittent relief. Denies fever, chills, redness, warmth, wound, drainage, numbness, weakness, decreased range of motion.  Past Medical History:  Diagnosis Date  . Chronic systolic heart failure ([place])   . Diabetes mellitus without complication ([place])   . NICM (nonischemic cardiomyopathy) ([place]) EF 20-25%     Patient Active Problem List   Diagnosis Date Noted  . Cardiomyopathy- suspect NICM but etiology not yet determined 04/27/2016  . Acute combined systolic and diastolic heart failure ([place]) 04/27/2016  . History of major depression 04/27/2016  . Community acquired pneumonia 04/26/2016  . Acute respiratory failure with hypoxia ([place]) 04/26/2016  . Controlled type 2 diabetes mellitus with hyperglycemia ([place]) 04/26/2016  . Uncontrolled type 2 diabetes mellitus with hyperglycemia ([place]) 04/26/2016  . Alcohol use disorder, mild, abuse 12/12/2015  . MDD (major depressive disorder), recurrent severe, without psychosis ([place]) 06/26/2015  . History of cocaine abuse 06/25/2015  . SPONDYLOSIS, CERVICAL, WITH RADICULOPATHY 02/25/2009  . MUSCLE SPASM, BACK 02/12/2009  . LEUKOPENIA, MILD 08/19/2008  . ECZEMA, HANDS 08/06/2008  . HYPERCHOLESTEROLEMIA 05/09/2007  . SINUSITIS, CHRONIC 05/01/2007  . ALLERGIC RHINITIS 12/21/2006  . HYPERTENSION 12/07/2006     Past Surgical History:  Procedure Laterality Date  . CARDIAC CATHETERIZATION N/A 04/30/2016   Procedure: Right/Left Heart Cath and Coronary Angiography;  Surgeon: Nelva Bush, MD;  Location: [facility];  Service: Cardiovascular;  Laterality: N/A;  . FINGER SURGERY         Home Medications    Prior to Admission medications   Medication Sig Start Date End Date Taking? Authorizing Provider  aspirin 81 MG chewable tablet Chew 1 tablet (81 mg total) by mouth daily. 05/02/16   Dessa Phi Chahn-Yang, DO  atorvastatin (LIPITOR) 40 MG tablet Take 1 tablet (40 mg total) by mouth daily at 6 PM. 07/09/16   Almyra Deforest, PA  blood glucose meter kit and supplies Dispense based on patient and insurance preference. Use up to four times daily as directed. (FOR ICD-9 250.00, 250.01). 05/02/16   Dessa Phi Chahn-Yang, DO  carvedilol (COREG) 25 MG tablet Take 1 tablet (25 mg total) by mouth 2 (two) times daily with a meal. 05/28/16   Almyra Deforest, PA  furosemide (LASIX) 20 MG tablet Take 1 tablet (20 mg total) by mouth daily. 05/28/16   Almyra Deforest, PA  ibuprofen (ADVIL,MOTRIN) 600 MG tablet Take 1 tablet (600 mg total) by mouth every 6 (six) hours as needed. 09/22/16   Nona Dell, PA-C  Insulin Aspart (NOVOLOG [place]) Inject into the skin. Sliding Scale    [provider]  insulin aspart protamine- aspart (NOVOLOG MIX 70/30) (70-30) 100 UNIT/ML injection Inject into the skin daily with breakfast. Sliding scale    [provider]  insulin  glargine (LANTUS) 100 UNIT/ML injection Inject into the skin at bedtime. Sliding scale    [provider]  Insulin Syringe-Needle U-100 (INSULIN SYRINGE .3CC/31GX5/16") 31G X 5/16" 0.3 ML MISC Use with lantus, once daily 05/02/16   Dessa Phi Chahn-Yang, DO  lisinopril (PRINIVIL,ZESTRIL) 20 MG tablet Take 1 tablet (20 mg total) by mouth daily. 05/14/16   Lelon Perla, MD  metFORMIN (GLUCOPHAGE) 1000 MG tablet Take 1 tablet  (1,000 mg total) by mouth 2 (two) times daily with a meal. 05/02/16   Dessa Phi Chahn-Yang, DO  spironolactone (ALDACTONE) 25 MG tablet Take 1 tablet (25 mg total) by mouth daily. 05/28/16 08/26/16  Almyra Deforest, PA    Family History Family History  Problem Relation Age of Onset  . Diabetes Other   . Hypertension Other   . Diabetes Mother   . Brain cancer Father   . Mental illness Brother   . Drug abuse Brother   . Diabetes Brother     Social History Social History  Substance Use Topics  . Smoking status: Former Smoker    Packs/day: 0.50    Years: 23.00    Types: Cigarettes    Quit date: 04/26/2016  . Smokeless tobacco: Never Used  . Alcohol use No     Comment: none as of 04/26/16     Allergies   Patient has no known allergies.   Review of Systems Review of Systems  Constitutional: Negative for fever.  Musculoskeletal: Positive for arthralgias (left little toe/foot) and joint swelling.  Skin: Negative for wound.  Neurological: Negative for weakness and numbness.     Physical Exam Updated Vital Signs BP 118/65   Pulse 84   Temp 98.3 F (36.8 C) (Oral)   Resp 18   SpO2 99%   Physical Exam  Constitutional: He is oriented to person, place, and time. He appears well-developed and well-nourished.  HENT:  Head: Normocephalic and atraumatic.  Eyes: Conjunctivae and EOM are normal. Right eye exhibits no discharge. Left eye exhibits no discharge. No scleral icterus.  Neck: Normal range of motion. Neck supple.  Cardiovascular: Normal rate and intact distal pulses.   Pulmonary/Chest: Effort normal.  Musculoskeletal: Normal range of motion. He exhibits tenderness. He exhibits no edema or deformity.  Mild swelling and ecchymoses present to dorsal aspect of left 5th toe, distal metatarsal and MTP joint. No erythema, warmth, induration, fluctuance, drainage or wound present to dorsum or sole of foot. FROM of left ankle, foot and toes with 5/5 strength. Sensation grossly  intact. 2+ DP pulse. Cap refill <2. Pt able to stand and ambulate but endorses associated pain.   Neurological: He is alert and oriented to person, place, and time.  Skin: Skin is warm and dry.  Nursing note and vitals reviewed.    ED Treatments / Results  Labs (all labs ordered are listed, but only abnormal results are displayed) Labs Reviewed - No data to display  EKG  EKG Interpretation None       Radiology Dg Foot Complete Left  Result Date: 09/22/2016 CLINICAL DATA:  Pain/swelling along 5th digit EXAM: LEFT FOOT - COMPLETE 3+ VIEW COMPARISON:  None. FINDINGS: No fracture or dislocation is seen. The joint spaces are preserved.  No marginal erosions. Mild soft tissue swelling lateral to the 5th MTP joint. IMPRESSION: No acute osseus abnormality is seen. Electronically Signed   By: Julian Hy M.D.   On: 09/22/2016 20:18    Procedures Procedures (including critical care time)  Medications Ordered in  ED Medications - No data to display   Initial Impression / Assessment and Plan / ED Course  I have reviewed the triage vital signs and the nursing notes.  Pertinent labs & imaging results that were available during my care of the patient were reviewed by me and considered in my medical decision making (see chart for details).     Patient presents with left little toe pain. Denies any known injury or trauma. VSS. Exam revealed mild swelling and ecchymoses present to dorsum of left foot over left fifth metatarsal, MTP joint and proximal phalanx, mild tenderness palpation. Range of motion of left foot and toes. Left foot neurovascularly intact. No evidence of wound or diabetic foot ulcer. No evidence of Cellulitis/infection. Left foot x-ray showed mild soft tissue swelling lateral to fifth MTP joint, No acute osseous abnormality. Suspect patient's symptoms are likely due to contusion. Discussed Results and plan for discharge with patient. Plan to discharge patient home with  NSAIDs and symptomatic treatment including cryotherapy and RICE tx. Advised patient to follow up with PCP in the next 4-5 days if symptoms have not improved or worsen. Discussed return precautions.  Final Clinical Impressions(s) / ED Diagnoses   Final diagnoses:  Contusion of left foot, initial encounter    New Prescriptions Discharge Medication List as of 09/22/2016  8:31 PM    START taking these medications   Details  ibuprofen (ADVIL,MOTRIN) 600 MG tablet Take 1 tablet (600 mg total) by mouth every 6 (six) hours as needed., Starting Wed 09/22/2016, Print         Nona Dell, PA-C 09/22/16 2117    Lacretia Leigh, MD 09/23/16 [id number]

## 2016-09-27 ENCOUNTER — Inpatient Hospital Stay (HOSPITAL_COMMUNITY)
Admission: EM | Admit: 2016-09-27 | Discharge: 2016-09-29 | DRG: 603 | Disposition: A | Discharge status: Home / Self Care | Payer: Self-pay | Attending: Internal Medicine | Admitting: Internal Medicine

## 2016-09-27 ENCOUNTER — Encounter (HOSPITAL_COMMUNITY): Payer: Self-pay

## 2016-09-27 DIAGNOSIS — I429 Cardiomyopathy, unspecified: Secondary | ICD-10-CM

## 2016-09-27 DIAGNOSIS — D649 Anemia, unspecified: Secondary | ICD-10-CM | POA: Diagnosis present

## 2016-09-27 DIAGNOSIS — Z79899 Other long term (current) drug therapy: Secondary | ICD-10-CM

## 2016-09-27 DIAGNOSIS — L0390 Cellulitis, unspecified: Secondary | ICD-10-CM | POA: Diagnosis present

## 2016-09-27 DIAGNOSIS — Z794 Long term (current) use of insulin: Secondary | ICD-10-CM

## 2016-09-27 DIAGNOSIS — M7989 Other specified soft tissue disorders: Secondary | ICD-10-CM | POA: Diagnosis present

## 2016-09-27 DIAGNOSIS — L03116 Cellulitis of left lower limb: Principal | ICD-10-CM | POA: Diagnosis present

## 2016-09-27 DIAGNOSIS — I10 Essential (primary) hypertension: Secondary | ICD-10-CM | POA: Diagnosis present

## 2016-09-27 DIAGNOSIS — Z808 Family history of malignant neoplasm of other organs or systems: Secondary | ICD-10-CM

## 2016-09-27 DIAGNOSIS — L02612 Cutaneous abscess of left foot: Secondary | ICD-10-CM

## 2016-09-27 DIAGNOSIS — Z833 Family history of diabetes mellitus: Secondary | ICD-10-CM

## 2016-09-27 DIAGNOSIS — Z87891 Personal history of nicotine dependence: Secondary | ICD-10-CM

## 2016-09-27 DIAGNOSIS — I110 Hypertensive heart disease with heart failure: Secondary | ICD-10-CM | POA: Diagnosis present

## 2016-09-27 DIAGNOSIS — E1165 Type 2 diabetes mellitus with hyperglycemia: Secondary | ICD-10-CM | POA: Diagnosis present

## 2016-09-27 DIAGNOSIS — L03032 Cellulitis of left toe: Secondary | ICD-10-CM

## 2016-09-27 DIAGNOSIS — I5022 Chronic systolic (congestive) heart failure: Secondary | ICD-10-CM | POA: Diagnosis present

## 2016-09-27 DIAGNOSIS — Z8249 Family history of ischemic heart disease and other diseases of the circulatory system: Secondary | ICD-10-CM

## 2016-09-27 DIAGNOSIS — Z7982 Long term (current) use of aspirin: Secondary | ICD-10-CM

## 2016-09-27 HISTORY — DX: Major depressive disorder, single episode, unspecified: F32.9

## 2016-09-27 HISTORY — DX: Essential (primary) hypertension: I10

## 2016-09-27 HISTORY — DX: Cutaneous abscess of unspecified foot: L02.619

## 2016-09-27 HISTORY — DX: Cellulitis of unspecified part of limb: L03.119

## 2016-09-27 HISTORY — DX: Depression, unspecified: F32.A

## 2016-09-27 LAB — CBC WITH DIFFERENTIAL/PLATELET
BASOS PCT: 0 %
Basophils Absolute: 0 10*3/uL (ref 0.0–0.1)
EOS PCT: 3 %
Eosinophils Absolute: 0.1 10*3/uL (ref 0.0–0.7)
HEMATOCRIT: 37.1 % — AB (ref 39.0–52.0)
Hemoglobin: 11.9 g/dL — ABNORMAL LOW (ref 13.0–17.0)
Lymphocytes Relative: 41 %
Lymphs Abs: 1.8 10*3/uL (ref 0.7–4.0)
MCH: 27.1 pg (ref 26.0–34.0)
MCHC: 32.1 g/dL (ref 30.0–36.0)
MCV: 84.5 fL (ref 78.0–100.0)
MONO ABS: 0.2 10*3/uL (ref 0.1–1.0)
Monocytes Relative: 4 %
NEUTROS ABS: 2.2 10*3/uL (ref 1.7–7.7)
Neutrophils Relative %: 52 %
Platelets: 211 10*3/uL (ref 150–400)
RBC: 4.39 MIL/uL (ref 4.22–5.81)
RDW: 14.3 % (ref 11.5–15.5)
WBC: 4.4 10*3/uL (ref 4.0–10.5)

## 2016-09-27 LAB — BASIC METABOLIC PANEL
Anion gap: 11 (ref 5–15)
BUN: 24 mg/dL — ABNORMAL HIGH (ref 6–20)
CALCIUM: 9.2 mg/dL (ref 8.9–10.3)
CO2: 23 mmol/L (ref 22–32)
CREATININE: 1.22 mg/dL (ref 0.61–1.24)
Chloride: 96 mmol/L — ABNORMAL LOW (ref 101–111)
GLUCOSE: 526 mg/dL — AB (ref 65–99)
Potassium: 4.5 mmol/L (ref 3.5–5.1)
Sodium: 130 mmol/L — ABNORMAL LOW (ref 135–145)

## 2016-09-27 LAB — I-STAT CG4 LACTIC ACID, ED: Lactic Acid, Venous: 2.17 mmol/L (ref 0.5–1.9)

## 2016-09-27 LAB — CBG MONITORING, ED: GLUCOSE-CAPILLARY: 417 mg/dL — AB (ref 65–99)

## 2016-09-27 MED ORDER — SODIUM CHLORIDE 0.9 % IV BOLUS (SEPSIS)
1000.0000 mL | Freq: Once | INTRAVENOUS | Status: AC
  Administered 2016-09-27: 1000 mL via INTRAVENOUS

## 2016-09-27 MED ORDER — CLINDAMYCIN PHOSPHATE 600 MG/50ML IV SOLN
600.0000 mg | Freq: Once | INTRAVENOUS | Status: AC
  Administered 2016-09-27: 600 mg via INTRAVENOUS
  Filled 2016-09-27: qty 50

## 2016-09-27 MED ORDER — INSULIN ASPART 100 UNIT/ML ~~LOC~~ SOLN
10.0000 [IU] | Freq: Once | SUBCUTANEOUS | Status: AC
  Administered 2016-09-27: 10 [IU] via SUBCUTANEOUS
  Filled 2016-09-27: qty 1

## 2016-09-27 NOTE — ED Triage Notes (Signed)
Pt reports swelling to left foot since last week. He states he was seen at [facility] and states he was told to return to the ER if it did not get any better and pt reports nothing has changed. Ambulatory. Hx of diabetes.

## 2016-09-27 NOTE — ED Provider Notes (Signed)
[facility] DEPT Provider Note   CSN: [id number] Arrival date & time: 09/27/16  1727     History   Chief Complaint Chief Complaint  Patient presents with  . Foot Swelling    HPI James [name] is a 56 y.o. male.  HPI James [name] is a 56 y.o. male with history of CHF, diabetes, presents to emergency department complaining of left foot pain. Patient states he noticed some pain and swelling to the fifth toe of the left foot. States that he was seen in emergency department a few days ago and was told to keep his foot elevated and come back if worsening. Patient states that it is getting much worse. He reports redness, swelling, some crusty drainage. He denies any known injuries. No fever or chills. Sugar elevated at home, states he is compliant with his medications but having trouble controlling his sugars.  Past Medical History:  Diagnosis Date  . Chronic systolic heart failure ([place])   . Diabetes mellitus without complication ([facility])   . NICM (nonischemic cardiomyopathy) ([place]) EF 20-25%     Patient Active Problem List   Diagnosis Date Noted  . Cardiomyopathy- suspect NICM but etiology not yet determined 04/27/2016  . Acute combined systolic and diastolic heart failure ([place]) 04/27/2016  . History of major depression 04/27/2016  . Community acquired pneumonia 04/26/2016  . Acute respiratory failure with hypoxia ([place]) 04/26/2016  . Controlled type 2 diabetes mellitus with hyperglycemia ([place]) 04/26/2016  . Uncontrolled type 2 diabetes mellitus with hyperglycemia ([place]) 04/26/2016  . Alcohol use disorder, mild, abuse 12/12/2015  . MDD (major depressive disorder), recurrent severe, without psychosis ([place]) 06/26/2015  . History of cocaine abuse 06/25/2015  . SPONDYLOSIS, CERVICAL, WITH RADICULOPATHY 02/25/2009  . MUSCLE SPASM, BACK 02/12/2009  . LEUKOPENIA, MILD 08/19/2008  . ECZEMA, HANDS 08/06/2008  . HYPERCHOLESTEROLEMIA 05/09/2007  . SINUSITIS, CHRONIC 05/01/2007  .  ALLERGIC RHINITIS 12/21/2006  . HYPERTENSION 12/07/2006    Past Surgical History:  Procedure Laterality Date  . CARDIAC CATHETERIZATION N/A 04/30/2016   Procedure: Right/Left Heart Cath and Coronary Angiography;  Surgeon: Nelva Bush, MD;  Location: [facility];  Service: Cardiovascular;  Laterality: N/A;  . FINGER SURGERY         Home Medications    Prior to Admission medications   Medication Sig Start Date End Date Taking? Authorizing Provider  aspirin 81 MG chewable tablet Chew 1 tablet (81 mg total) by mouth daily. 05/02/16   Dessa Phi Chahn-Yang, DO  atorvastatin (LIPITOR) 40 MG tablet Take 1 tablet (40 mg total) by mouth daily at 6 PM. 07/09/16   Almyra Deforest, PA  blood glucose meter kit and supplies Dispense based on patient and insurance preference. Use up to four times daily as directed. (FOR ICD-9 250.00, 250.01). 05/02/16   Dessa Phi Chahn-Yang, DO  carvedilol (COREG) 25 MG tablet Take 1 tablet (25 mg total) by mouth 2 (two) times daily with a meal. 05/28/16   Almyra Deforest, PA  furosemide (LASIX) 20 MG tablet Take 1 tablet (20 mg total) by mouth daily. 05/28/16   Almyra Deforest, PA  ibuprofen (ADVIL,MOTRIN) 600 MG tablet Take 1 tablet (600 mg total) by mouth every 6 (six) hours as needed. 09/22/16   Nona Dell, PA-C  Insulin Aspart (NOVOLOG [place]) Inject into the skin. Sliding Scale    [provider]  insulin aspart protamine- aspart (NOVOLOG MIX 70/30) (70-30) 100 UNIT/ML injection Inject into the skin daily with breakfast. Sliding scale  [provider]  insulin glargine (LANTUS) 100 UNIT/ML injection Inject into the skin at bedtime. Sliding scale    [provider]  Insulin Syringe-Needle U-100 (INSULIN SYRINGE .3CC/31GX5/16") 31G X 5/16" 0.3 ML MISC Use with lantus, once daily 05/02/16   Dessa Phi Chahn-Yang, DO  lisinopril (PRINIVIL,ZESTRIL) 20 MG tablet Take 1 tablet (20 mg total) by mouth daily. 05/14/16   Lelon Perla,  MD  metFORMIN (GLUCOPHAGE) 1000 MG tablet Take 1 tablet (1,000 mg total) by mouth 2 (two) times daily with a meal. 05/02/16   Dessa Phi Chahn-Yang, DO  spironolactone (ALDACTONE) 25 MG tablet Take 1 tablet (25 mg total) by mouth daily. 05/28/16 08/26/16  Almyra Deforest, PA    Family History Family History  Problem Relation Age of Onset  . Diabetes Other   . Hypertension Other   . Diabetes Mother   . Brain cancer Father   . Mental illness Brother   . Drug abuse Brother   . Diabetes Brother     Social History Social History  Substance Use Topics  . Smoking status: Former Smoker    Packs/day: 0.50    Years: 23.00    Types: Cigarettes    Quit date: 04/26/2016  . Smokeless tobacco: Never Used  . Alcohol use No     Comment: none as of 04/26/16     Allergies   Patient has no known allergies.   Review of Systems Review of Systems  Constitutional: Negative for chills and fever.  Respiratory: Negative for cough, chest tightness and shortness of breath.   Cardiovascular: Negative for chest pain, palpitations and leg swelling.  Genitourinary: Negative for dysuria, frequency, hematuria and urgency.  Musculoskeletal: Positive for arthralgias. Negative for myalgias, neck pain and neck stiffness.  Skin: Positive for wound. Negative for rash.  Allergic/Immunologic: Negative for immunocompromised state.  Neurological: Negative for dizziness, weakness, light-headedness, numbness and headaches.     Physical Exam Updated Vital Signs BP (!) 116/54 (BP Location: Left Arm)   Pulse 86   Temp 97.8 F (36.6 C) (Oral)   Resp 18   Ht 6' (1.829 m)   Wt 78 kg (172 lb)   SpO2 100%   BMI 23.33 kg/m   Physical Exam  Constitutional: He appears well-developed and well-nourished. No distress.  HENT:  Head: Normocephalic and atraumatic.  Eyes: Conjunctivae are normal.  Neck: Neck supple.  Cardiovascular: Normal rate, regular rhythm and normal heart sounds.   Pulmonary/Chest: Effort normal.  No respiratory distress. He has no wheezes. He has no rales.  Abdominal: Soft. Bowel sounds are normal. He exhibits no distension. There is no tenderness. There is no rebound.  Musculoskeletal: He exhibits no edema.  Erythema, swelling to the left lateral dorsal foot specifically over fourth and fifth metatarsals, there is a fluctuant area with a purulent blister to the left fifth MCP joint. There is some crusty drainage from between fourth and fifth toes. Tender to palpation.  Neurological: He is alert.  Skin: Skin is warm and dry.  Nursing note and vitals reviewed.    ED Treatments / Results  Labs (all labs ordered are listed, but only abnormal results are displayed) Labs Reviewed  CBC WITH DIFFERENTIAL/PLATELET - Abnormal; Notable for the following:       Result Value   Hemoglobin 11.9 (*)    HCT 37.1 (*)    All other components within normal limits  BASIC METABOLIC PANEL - Abnormal; Notable for the following:    Sodium 130 (*)  Chloride 96 (*)    Glucose, Bld 526 (*)    BUN 24 (*)    All other components within normal limits  CBG MONITORING, ED - Abnormal; Notable for the following:    Glucose-Capillary 417 (*)    All other components within normal limits  I-STAT CG4 LACTIC ACID, ED - Abnormal; Notable for the following:    Lactic Acid, Venous 2.17 (*)    All other components within normal limits    EKG  EKG Interpretation None       Radiology No results found.  Procedures Procedures (including critical care time)  Medications Ordered in ED Medications  sodium chloride 0.9 % bolus 1,000 mL (not administered)  sodium chloride 0.9 % bolus 1,000 mL (not administered)  insulin aspart (novoLOG) injection 10 Units (not administered)  clindamycin (CLEOCIN) IVPB 600 mg (600 mg Intravenous New Bag/Given 09/27/16 2212)     Initial Impression / Assessment and Plan / ED Course  I have reviewed the triage vital signs and the nursing notes.  Pertinent labs & imaging  results that were available during my care of the patient were reviewed by me and considered in my medical decision making (see chart for details).     Agent emergency department with infection to the left fifth toe with surrounding cellulitis. A blood sugar is not controlled, it's 526. Will start IV fluids, give insulin, clindamycin for infection, will admit.  Vitals:   09/27/16 1742 09/27/16 2038 09/27/16 2128  BP: 116/67 (!) 116/54   Pulse: 100 86   Resp: 18 18   Temp: 98.1 F (36.7 C) 97.8 F (36.6 C)   TempSrc: Oral Oral   SpO2: 100% 100%   Weight:   78 kg (172 lb)  Height:   6' (1.829 m)   Spoke with triad, will admit.   Final Clinical Impressions(s) / ED Diagnoses   Final diagnoses:  Cellulitis and abscess of toe of left foot    New Prescriptions New Prescriptions   No medications on file     Jeannett Senior, Hershal Coria 09/28/16 0006    Daleen Bo, MD 09/28/16 1312

## 2016-09-28 ENCOUNTER — Inpatient Hospital Stay (HOSPITAL_COMMUNITY): Payer: Self-pay

## 2016-09-28 ENCOUNTER — Encounter (HOSPITAL_COMMUNITY): Payer: Self-pay | Admitting: Internal Medicine

## 2016-09-28 DIAGNOSIS — L03116 Cellulitis of left lower limb: Secondary | ICD-10-CM

## 2016-09-28 DIAGNOSIS — L0390 Cellulitis, unspecified: Secondary | ICD-10-CM | POA: Diagnosis present

## 2016-09-28 LAB — BASIC METABOLIC PANEL
ANION GAP: 6 (ref 5–15)
BUN: 16 mg/dL (ref 6–20)
CHLORIDE: 106 mmol/L (ref 101–111)
CO2: 25 mmol/L (ref 22–32)
Calcium: 8.4 mg/dL — ABNORMAL LOW (ref 8.9–10.3)
Creatinine, Ser: 0.94 mg/dL (ref 0.61–1.24)
GFR calc Af Amer: >60 mL/min (ref >60)
GLUCOSE: 152 mg/dL — AB (ref 65–99)
POTASSIUM: 3.9 mmol/L (ref 3.5–5.1)
Sodium: 137 mmol/L (ref 135–145)

## 2016-09-28 LAB — URIC ACID: Uric Acid, Serum: 4.1 mg/dL — ABNORMAL LOW (ref 4.4–7.6)

## 2016-09-28 LAB — CBG MONITORING, ED
Glucose-Capillary: 155 mg/dL — ABNORMAL HIGH (ref 65–99)
Glucose-Capillary: 304 mg/dL — ABNORMAL HIGH (ref 65–99)

## 2016-09-28 LAB — CBC
HEMATOCRIT: 36.1 % — AB (ref 39.0–52.0)
HEMOGLOBIN: 11.8 g/dL — AB (ref 13.0–17.0)
MCH: 27.5 pg (ref 26.0–34.0)
MCHC: 32.7 g/dL (ref 30.0–36.0)
MCV: 84.1 fL (ref 78.0–100.0)
Platelets: 214 10*3/uL (ref 150–400)
RBC: 4.29 MIL/uL (ref 4.22–5.81)
RDW: 14.1 % (ref 11.5–15.5)
WBC: 3.5 10*3/uL — ABNORMAL LOW (ref 4.0–10.5)

## 2016-09-28 LAB — MRSA PCR SCREENING: MRSA by PCR: NEGATIVE

## 2016-09-28 LAB — LACTIC ACID, PLASMA: LACTIC ACID, VENOUS: 0.8 mmol/L (ref 0.5–1.9)

## 2016-09-28 LAB — GLUCOSE, CAPILLARY
GLUCOSE-CAPILLARY: 201 mg/dL — AB (ref 65–99)
Glucose-Capillary: 179 mg/dL — ABNORMAL HIGH (ref 65–99)
Glucose-Capillary: 207 mg/dL — ABNORMAL HIGH (ref 65–99)
Glucose-Capillary: 157 mg/dL — ABNORMAL HIGH (ref 65–99)

## 2016-09-28 LAB — RAPID URINE DRUG SCREEN, HOSP PERFORMED
Amphetamines: NOT DETECTED
BARBITURATES: NOT DETECTED
Benzodiazepines: NOT DETECTED
COCAINE: NOT DETECTED
Opiates: POSITIVE — AB
TETRAHYDROCANNABINOL: NOT DETECTED

## 2016-09-28 MED ORDER — PIPERACILLIN-TAZOBACTAM 3.375 G IVPB
3.3750 g | Freq: Three times a day (TID) | INTRAVENOUS | Status: DC
  Administered 2016-09-28 – 2016-09-29 (×3): 3.375 g via INTRAVENOUS
  Filled 2016-09-28 (×5): qty 50

## 2016-09-28 MED ORDER — LISINOPRIL 10 MG PO TABS
20.0000 mg | ORAL_TABLET | Freq: Every day | ORAL | Status: DC
  Administered 2016-09-28 – 2016-09-29 (×2): 20 mg via ORAL
  Filled 2016-09-28 (×2): qty 2

## 2016-09-28 MED ORDER — VANCOMYCIN HCL IN DEXTROSE 1-5 GM/200ML-% IV SOLN
1000.0000 mg | Freq: Two times a day (BID) | INTRAVENOUS | Status: DC
  Administered 2016-09-28 – 2016-09-29 (×2): 1000 mg via INTRAVENOUS
  Filled 2016-09-28 (×3): qty 200

## 2016-09-28 MED ORDER — INSULIN GLARGINE 100 UNIT/ML ~~LOC~~ SOLN
15.0000 [IU] | Freq: Every day | SUBCUTANEOUS | Status: DC
  Administered 2016-09-28 (×2): 15 [IU] via SUBCUTANEOUS
  Filled 2016-09-28 (×3): qty 0.15

## 2016-09-28 MED ORDER — PIPERACILLIN-TAZOBACTAM 3.375 G IVPB 30 MIN
3.3750 g | Freq: Once | INTRAVENOUS | Status: AC
Start: 2016-09-28 — End: 2016-09-28
  Administered 2016-09-28: 3.375 g via INTRAVENOUS
  Filled 2016-09-28: qty 50

## 2016-09-28 MED ORDER — CARVEDILOL 25 MG PO TABS
25.0000 mg | ORAL_TABLET | Freq: Two times a day (BID) | ORAL | Status: DC
  Administered 2016-09-28 – 2016-09-29 (×3): 25 mg via ORAL
  Filled 2016-09-28 (×3): qty 1

## 2016-09-28 MED ORDER — MORPHINE SULFATE (PF) 2 MG/ML IV SOLN
1.0000 mg | INTRAVENOUS | Status: DC | PRN
  Administered 2016-09-28: 1 mg via INTRAVENOUS
  Filled 2016-09-28: qty 1

## 2016-09-28 MED ORDER — ONDANSETRON HCL 4 MG/2ML IJ SOLN: 4.0000 mg | Freq: Four times a day (QID) | INTRAMUSCULAR | Status: DC | PRN

## 2016-09-28 MED ORDER — INSULIN ASPART 100 UNIT/ML ~~LOC~~ SOLN
0.0000 [IU] | Freq: Three times a day (TID) | SUBCUTANEOUS | Status: DC
  Administered 2016-09-28: 3 [IU] via SUBCUTANEOUS
  Administered 2016-09-28: 5 [IU] via SUBCUTANEOUS
  Administered 2016-09-28: 3 [IU] via SUBCUTANEOUS
  Administered 2016-09-29: 5 [IU] via SUBCUTANEOUS
  Administered 2016-09-29: 8 [IU] via SUBCUTANEOUS

## 2016-09-28 MED ORDER — ACETAMINOPHEN 325 MG PO TABS
650.0000 mg | ORAL_TABLET | Freq: Four times a day (QID) | ORAL | Status: DC | PRN
  Administered 2016-09-28: 650 mg via ORAL
  Filled 2016-09-28: qty 2

## 2016-09-28 MED ORDER — VANCOMYCIN HCL IN DEXTROSE 1-5 GM/200ML-% IV SOLN
1000.0000 mg | Freq: Once | INTRAVENOUS | Status: AC
  Administered 2016-09-28: 1000 mg via INTRAVENOUS
  Filled 2016-09-28: qty 200

## 2016-09-28 MED ORDER — ACETAMINOPHEN 650 MG RE SUPP: 650.0000 mg | Freq: Four times a day (QID) | RECTAL | Status: DC | PRN

## 2016-09-28 MED ORDER — ONDANSETRON HCL 4 MG PO TABS: 4.0000 mg | ORAL_TABLET | Freq: Four times a day (QID) | ORAL | Status: DC | PRN

## 2016-09-28 NOTE — Progress Notes (Signed)
   09/28/16 0230  Vitals  Temp 97.5 F (36.4 C)  Temp Source Axillary  BP 131/76  BP Location Right Arm  BP Method Automatic  Patient Position (if appropriate) Sitting  Pulse Rate 84  Pulse Rate Source Monitor   Pt arrived to unit. VSS. Pt oriented to room. Call bell in place. Pt placed on tele and verified. Pt sent money to security, copy on chart.

## 2016-09-28 NOTE — Progress Notes (Signed)
VASCULAR LAB PRELIMINARY  ARTERIAL  ABI completed:    RIGHT    LEFT    PRESSURE WAVEFORM  PRESSURE WAVEFORM  BRACHIAL 132 Triphasic BRACHIAL 132 Triphasic  DP 153 Triphasic DP 148 Triphasic  PT 156 Triphasic PT 152 Triphasic    RIGHT LEFT  ABI 1.18 1.15   ABIs and Doppler waveforms indicate normal arterial flow bilaterally at rest.  Brettany Sydney, RVS 09/28/2016, 3:00 PM

## 2016-09-28 NOTE — Progress Notes (Signed)
I have seen and assessed patient and agree with Dr. Juanell Fairly assessment and plan. Patient is a 56 year old gentleman with a history of nonischemic cardiomyopathy, diabetes mellitus, history of drug abuse presented to the ED with was no swelling of his left foot. Patient being treated as a cellulitis. MRI of the left foot done was negative for osteomyelitis of fracture. ABIs have been ordered. Patient afebrile. Continue empiric IV antibiotics. If patient continues to improve could transition to oral antibiotics tomorrow.  No charge.

## 2016-09-28 NOTE — Progress Notes (Signed)
Inpatient Diabetes Program Recommendations  AACE/ADA: New Consensus Statement on Inpatient Glycemic Control (2015)  Target Ranges:  Prepandial:   less than 140 mg/dL      Peak postprandial:   less than 180 mg/dL (1-2 hours)      Critically ill patients:  140 - 180 mg/dL   Lab Results  Component Value Date   GLUCAP 207 (H) 09/28/2016   HGBA1C 13.2 (H) 04/27/2016    Review of Glycemic Control Results for James [name], James [name] (MRN [id number]) as of 09/28/2016 11:27  Ref. Range 09/27/2016 17:46 09/28/2016 00:30 09/28/2016 01:34 09/28/2016 06:18 09/28/2016 11:22  Glucose-Capillary Latest Ref Range: 65 - 99 mg/dL 815 (H) 947 (H) 076 (H) 179 (H) 207 (H)   Diabetes history: DM2 Outpatient Diabetes medications: 70/30 10-15 units bid + Metformin 1 gm bid Current orders for Inpatient glycemic control: Lantus 15 units qd + 0-15 units tid  Inpatient Diabetes Program Recommendations:  Please consider A1c to determine prehospital glycemic control. A1c was 13.2 on 04/27/16. Will follow.  Thank you, Billy Fischer. Lyndal Alamillo, RN, MSN, CDE  Diabetes Coordinator Inpatient Glycemic Control Team Team Pager [phone] (8am-5pm) 09/28/2016 11:31 AM

## 2016-09-28 NOTE — H&P (Signed)
History and Physical    James [name] LNL:[id number] DOB: 21-Sep-1960 DOA: 09/27/2016  PCP: Marliss Coots, NP  Patient coming from: Home.  Chief Complaint: Left foot pain.  HPI: James [name] is a 56 y.o. male with history of nonischemic cardiomyopathy, diabetes mellitus type chronic anemia, history of drug abuse presents to the ER because of worsening swelling and pain involving the left foot. Patient has been having these symptoms for last 1 week. Denies any trauma or insect bite. Had come to the ER 3 days ago and had x-rays done which were negative. Since patient's symptoms were worsening and also has new discharge from the left small toe patient came to the ER.   ED Course: On exam patient's left foot is swollen and erythematous and small discharge from the left foot small toe. Blood cultures were obtained and started on empiric antibiotics for cellulitis. Patient's blood sugar was also elevated. Patient was given NovoLog insulin 10 units subcutaneous.  Review of Systems: As per HPI, rest all negative.   Past Medical History:  Diagnosis Date  . Chronic systolic heart failure ([place])   . Diabetes mellitus without complication ([place])   . NICM (nonischemic cardiomyopathy) ([place]) EF 20-25%     Past Surgical History:  Procedure Laterality Date  . CARDIAC CATHETERIZATION N/A 04/30/2016   Procedure: Right/Left Heart Cath and Coronary Angiography;  Surgeon: Nelva Bush, MD;  Location: [facility];  Service: Cardiovascular;  Laterality: N/A;  . FINGER SURGERY       reports that he quit smoking about 5 months ago. His smoking use included Cigarettes. He has a 11.50 pack-year smoking history. He has never used smokeless tobacco. He reports that he does not drink alcohol or use drugs.  No Known Allergies  Family History  Problem Relation Age of Onset  . Diabetes Other   . Hypertension Other   . Diabetes Mother   . Brain cancer Father   . Mental illness Brother   .  Drug abuse Brother   . Diabetes Brother     Prior to Admission medications   Medication Sig Start Date End Date Taking? Authorizing Provider  carvedilol (COREG) 25 MG tablet Take 1 tablet (25 mg total) by mouth 2 (two) times daily with a meal. 05/28/16  Yes Almyra Deforest, PA  furosemide (LASIX) 20 MG tablet Take 1 tablet (20 mg total) by mouth daily. 05/28/16  Yes Almyra Deforest, PA  insulin aspart protamine- aspart (NOVOLOG MIX 70/30) (70-30) 100 UNIT/ML injection Inject 10-15 Units into the skin 2 (two) times daily with a meal. Sliding scale    Yes [provider]  insulin glargine (LANTUS) 100 UNIT/ML injection Inject 12-15 Units into the skin at bedtime. Sliding scale    Yes [provider]  lisinopril (PRINIVIL,ZESTRIL) 20 MG tablet Take 1 tablet (20 mg total) by mouth daily. 05/14/16  Yes Lelon Perla, MD  metFORMIN (GLUCOPHAGE) 1000 MG tablet Take 1 tablet (1,000 mg total) by mouth 2 (two) times daily with a meal. 05/02/16  Yes Dessa Phi Chahn-Yang, DO  spironolactone (ALDACTONE) 25 MG tablet Take 1 tablet (25 mg total) by mouth daily. 05/28/16 09/27/16 Yes Almyra Deforest, PA  aspirin 81 MG chewable tablet Chew 1 tablet (81 mg total) by mouth daily. Patient not taking: Reported on 09/27/2016 05/02/16   Dessa Phi Chahn-Yang, DO  atorvastatin (LIPITOR) 40 MG tablet Take 1 tablet (40 mg total) by mouth daily at 6 PM. Patient not taking: Reported on 09/27/2016  07/09/16   Almyra Deforest, PA  blood glucose meter kit and supplies Dispense based on patient and insurance preference. Use up to four times daily as directed. (FOR ICD-9 250.00, 250.01). 05/02/16   Dessa Phi Chahn-Yang, DO  ibuprofen (ADVIL,MOTRIN) 600 MG tablet Take 1 tablet (600 mg total) by mouth every 6 (six) hours as needed. Patient not taking: Reported on 09/27/2016 09/22/16   Nona Dell, PA-C  Insulin Syringe-Needle U-100 (INSULIN SYRINGE .3CC/31GX5/16") 31G X 5/16" 0.3 ML MISC Use with lantus, once daily  05/02/16   Shon Millet, DO    Physical Exam: Vitals:   09/27/16 1742 09/27/16 2038 09/27/16 2128 09/28/16 0033  BP: 116/67 (!) 116/54  129/73  Pulse: 100 86  81  Resp: _0 Temp: 98.1 F (36.7 C) 97.8 F (36.6 C)    TempSrc: Oral Oral    SpO2: 100% 100%  100%  Weight:   78 kg (172 lb)   Height:   6' (1.829 m)       Constitutional: Moderately built and nourished. Vitals:   09/27/16 1742 09/27/16 2038 09/27/16 2128 09/28/16 0033  BP: 116/67 (!) 116/54  129/73  Pulse: 100 86  81  Resp: _1 Temp: 98.1 F (36.7 C) 97.8 F (36.6 C)    TempSrc: Oral Oral    SpO2: 100% 100%  100%  Weight:   78 kg (172 lb)   Height:   6' (1.829 m)    Eyes: Anicteric no pallor. ENMT: No discharge from the ears eyes nose normal. Neck: No JVD appreciated no mass felt. Respiratory: No rhonchi or crepitations. Cardiovascular: S1-S2 heard no murmurs appreciated. Abdomen: Soft nontender bowel sounds present. Musculoskeletal: Left foot swollen with erythema. Small discharge from the left small toe. Skin: Erythematous left foot. Neurologic: Alert awake oriented to time place and person. Moves all extremities. Psychiatric: Appears normal. Normal affect.   Labs on Admission: I have personally reviewed following labs and imaging studies  CBC:  Recent Labs Lab 09/27/16 2204  WBC 4.4  NEUTROABS 2.2  HGB 11.9*  HCT 37.1*  MCV 84.5  PLT 269   Basic Metabolic Panel:  Recent Labs Lab 09/27/16 2204  NA 130*  K 4.5  CL 96*  CO2 23  GLUCOSE 526*  BUN 24*  CREATININE 1.22  CALCIUM 9.2   GFR: Estimated Creatinine Clearance: 72.4 mL/min (by C-G formula based on SCr of 1.22 mg/dL). Liver Function Tests: No results for input(s): AST, ALT, ALKPHOS, BILITOT, PROT, ALBUMIN in the last 168 hours. No results for input(s): LIPASE, AMYLASE in the last 168 hours. No results for input(s): AMMONIA in the last 168 hours. Coagulation Profile: No results for input(s): INR,  PROTIME in the last 168 hours. Cardiac Enzymes: No results for input(s): CKTOTAL, CKMB, CKMBINDEX, TROPONINI in the last 168 hours. BNP (last 3 results) No results for input(s): PROBNP in the last 8760 hours. HbA1C: No results for input(s): HGBA1C in the last 72 hours. CBG:  Recent Labs Lab 09/27/16 1746 09/28/16 0030  GLUCAP 417* 304*   Lipid Profile: No results for input(s): CHOL, HDL, LDLCALC, TRIG, CHOLHDL, LDLDIRECT in the last 72 hours. Thyroid Function Tests: No results for input(s): TSH, T4TOTAL, FREET4, T3FREE, THYROIDAB in the last 72 hours. Anemia Panel: No results for input(s): VITAMINB12, FOLATE, FERRITIN, TIBC, IRON, RETICCTPCT in the last 72 hours. Urine analysis:    Component Value Date/Time   COLORURINE YELLOW 12/06/2012 [place] 12/06/2012 1637  LABSPEC 1.041 (H) 12/06/2012 1637   PHURINE 6.0 12/06/2012 1637   GLUCOSEU >1000 (A) 12/06/2012 1637   HGBUR NEGATIVE 12/06/2012 1637   HGBUR negative 08/06/2008 1124   BILIRUBINUR NEGATIVE 12/06/2012 1637   KETONESUR NEGATIVE 12/06/2012 1637   PROTEINUR NEGATIVE 12/06/2012 1637   UROBILINOGEN 1.0 12/06/2012 1637   NITRITE NEGATIVE 12/06/2012 1637   LEUKOCYTESUR NEGATIVE 12/06/2012 1637   Sepsis Labs: _0 (procalcitonin:4,lacticidven:4) )No results found for this or any previous visit (from the past 240 hour(s)).   Radiological Exams on Admission: No results found.   Assessment/Plan Principal Problem:   Cellulitis of left foot Active Problems:   Essential hypertension   Uncontrolled type 2 diabetes mellitus with hyperglycemia ([place])   Cardiomyopathy- suspect NICM but etiology not yet determined   Cellulitis    1. Cellulitis of her left foot - I have ordered MRI of the left foot and ABI. Will place patient on empiric antibiotics for now. Check uric acid levels. 2. Diabetes mellitus type 2 uncontrolled - last hemoglobin A1c in January was 13. Patient states he takes Lantus 15  units at bedtime. I have placed patient on moderate dose sliding scale coverage. 3. Nonischemic cardiomyopathy - continue lisinopril but holding off diuretics due to elevated lactate levels. Patient appears compensated. 4. Normocytic normochromic anemia - appears to be chronic for a couple of dictation. 5. History of drug abuse - follow urine to screen.   DVT prophylaxis: SCDs until MRI results available. Code Status: Full code.  Family Communication: Discussed with patient.  Disposition Plan: Home.  Consults called: None.  Admission status: Inpatient.    Rise Patience MD [facility] Pager [phone].  If 7PM-7AM, please contact night-coverage [email or web address] Password [facility]  09/28/2016, 12:49 AM

## 2016-09-28 NOTE — Progress Notes (Signed)
Pharmacy Antibiotic Note  James [name] is a 56 y.o. male admitted on 09/27/2016 with L foot cellulitis.  Pharmacy has been consulted for Vancomycin and Zosyn dosing.  Plan: Zosyn 3.375gm IV now over 30 min then 3.375gm IV q8h - subsequent doses over 4 hours Vancomycin 1gm IV q12h Will f/u micro data, renal function, and pt's clinical condition Vanc trough prn  Height: 6' (182.9 cm) Weight: 172 lb (78 kg) IBW/kg (Calculated) : 77.6  Temp (24hrs), Avg:98 F (36.7 C), Min:97.8 F (36.6 C), Max:98.1 F (36.7 C)   Recent Labs Lab 09/27/16 2204 09/27/16 2212  WBC 4.4  --   CREATININE 1.22  --   LATICACIDVEN  --  2.17*    Estimated Creatinine Clearance: 72.4 mL/min (by C-G formula based on SCr of 1.22 mg/dL).    No Known Allergies  Antimicrobials this admission: 6/19 Zosyn >>  6/19 Vanc >>   Dose adjustments this admission: n/a  Microbiology results: Pending  Thank you for allowing pharmacy to be a part of this patient's care.  Christoper Fabian, PharmD, BCPS Clinical pharmacist, pager [phone] 09/28/2016 12:49 AM

## 2016-09-28 NOTE — ED Notes (Signed)
CBG 304 

## 2016-09-29 ENCOUNTER — Encounter (HOSPITAL_COMMUNITY): Payer: Self-pay | Admitting: General Practice

## 2016-09-29 DIAGNOSIS — L02619 Cutaneous abscess of unspecified foot: Secondary | ICD-10-CM

## 2016-09-29 DIAGNOSIS — E1165 Type 2 diabetes mellitus with hyperglycemia: Secondary | ICD-10-CM

## 2016-09-29 DIAGNOSIS — L03116 Cellulitis of left lower limb: Principal | ICD-10-CM

## 2016-09-29 DIAGNOSIS — L02612 Cutaneous abscess of left foot: Secondary | ICD-10-CM

## 2016-09-29 DIAGNOSIS — I429 Cardiomyopathy, unspecified: Secondary | ICD-10-CM

## 2016-09-29 DIAGNOSIS — I10 Essential (primary) hypertension: Secondary | ICD-10-CM

## 2016-09-29 DIAGNOSIS — L03032 Cellulitis of left toe: Secondary | ICD-10-CM

## 2016-09-29 HISTORY — DX: Cutaneous abscess of unspecified foot: L02.619

## 2016-09-29 LAB — CBC WITH DIFFERENTIAL/PLATELET
BASOS PCT: 1 %
Basophils Absolute: 0 10*3/uL (ref 0.0–0.1)
Eosinophils Absolute: 0.1 10*3/uL (ref 0.0–0.7)
Eosinophils Relative: 3 %
HEMATOCRIT: 33.9 % — AB (ref 39.0–52.0)
HEMOGLOBIN: 11.2 g/dL — AB (ref 13.0–17.0)
LYMPHS ABS: 1.4 10*3/uL (ref 0.7–4.0)
Lymphocytes Relative: 37 %
MCH: 27.5 pg (ref 26.0–34.0)
MCHC: 33 g/dL (ref 30.0–36.0)
MCV: 83.3 fL (ref 78.0–100.0)
MONOS PCT: 8 %
Monocytes Absolute: 0.3 10*3/uL (ref 0.1–1.0)
NEUTROS ABS: 1.9 10*3/uL (ref 1.7–7.7)
NEUTROS PCT: 51 %
Platelets: 202 10*3/uL (ref 150–400)
RBC: 4.07 MIL/uL — AB (ref 4.22–5.81)
RDW: 13.7 % (ref 11.5–15.5)
WBC: 3.8 10*3/uL — AB (ref 4.0–10.5)

## 2016-09-29 LAB — BASIC METABOLIC PANEL
Anion gap: 5 (ref 5–15)
BUN: 12 mg/dL (ref 6–20)
CHLORIDE: 101 mmol/L (ref 101–111)
CO2: 28 mmol/L (ref 22–32)
CREATININE: 0.89 mg/dL (ref 0.61–1.24)
Calcium: 8.5 mg/dL — ABNORMAL LOW (ref 8.9–10.3)
GFR calc non Af Amer: >60 mL/min (ref >60)
Glucose, Bld: 279 mg/dL — ABNORMAL HIGH (ref 65–99)
POTASSIUM: 4.7 mmol/L (ref 3.5–5.1)
Sodium: 134 mmol/L — ABNORMAL LOW (ref 135–145)

## 2016-09-29 LAB — GLUCOSE, CAPILLARY
GLUCOSE-CAPILLARY: 269 mg/dL — AB (ref 65–99)
Glucose-Capillary: 202 mg/dL — ABNORMAL HIGH (ref 65–99)

## 2016-09-29 MED ORDER — CLINDAMYCIN HCL 150 MG PO CAPS: 300.0000 mg | ORAL_CAPSULE | Freq: Three times a day (TID) | ORAL | 0 refills | Status: AC

## 2016-09-29 MED ORDER — INSULIN GLARGINE 100 UNIT/ML ~~LOC~~ SOLN: 15.0000 [IU] | Freq: Every day | SUBCUTANEOUS | 1 refills | Status: DC

## 2016-09-29 MED ORDER — INSULIN ASPART PROT & ASPART (70-30 MIX) 100 UNIT/ML ~~LOC~~ SUSP: 10.0000 [IU] | Freq: Two times a day (BID) | SUBCUTANEOUS | 11 refills | Status: DC

## 2016-09-29 MED FILL — LANTUS 100 UNITS/ML VIAL: 100 | 28 days supply | Qty: 10 | Fill #0

## 2016-09-29 MED FILL — CLINDAMYCIN HCL 150 MG CAPS: 150 | 5 days supply | Qty: 30 | Fill #0

## 2016-09-29 NOTE — Progress Notes (Signed)
Orthopedic Tech Progress Note Patient Details:  James [name] 1961/03/01 [id number]  Ortho Devices Type of Ortho Device: Postop shoe/boot Ortho Device/Splint Location: lle Ortho Device/Splint Interventions: Application   James [name] 09/29/2016, 1:56 PM

## 2016-09-29 NOTE — Progress Notes (Signed)
Pt. Has been waiting on ride for d/c not giving IV ABX- has been switched to PO that he will get from [employer].  Pt. Discharged to home  Pt. D/C'd via NT with Discharge information reviewed and given All personal belongings given to Pt. From security   Education discussed with teach back IV was d/c Tele d/c

## 2016-09-29 NOTE — Discharge Summary (Signed)
Physician Discharge Summary  James [name] CBU:[id number] DOB: 01-20-1961 DOA: 09/27/2016  PCP: Marliss Coots, NP  Admit date: 09/27/2016 Discharge date: 09/30/2016  Admitted From: Home.  Disposition:  Home  Recommendations for Outpatient Follow-up:  1. Follow up with PCP in 1-2 weeks 2. Please obtain BMP/CBC in one week   Discharge Condition:stable.  CODE STATUS: full code.  Diet recommendation: Heart Healthy / Carb Modified   Brief/Interim Summary:  56 year old gentleman with a history of nonischemic cardiomyopathy, diabetes mellitus, history of drug abuse presented to the ED with was swelling of his left foot. Patient being treated as a cellulitis. MRI of the left foot done was negative for osteomyelitis of fracture. ABIs ordered and indicate normal arterial flow bilaterally at rest.  He was started on IV antibiotics, the swelling, tenderness and erythema has improved and he was discharged on oral antibiotics to complete the course.   Discharge Diagnoses:  Principal Problem:   Cellulitis of left foot Active Problems:   Essential hypertension   Uncontrolled type 2 diabetes mellitus with hyperglycemia ([facility])   Cardiomyopathy- suspect NICM but etiology not yet determined   Cellulitis  Cellulitis of the left foot:  Improved with 2 days of IV antibiotics.  Transitioned to oral antibiotics on discharge to complete the course.     Type 2 DM:  Pt reports he tried the oral anti diabetic drugs with out much control.  He is at home on novolog 70/30 , which is able to afford and at the hospital he is on lantus.  He will get lantus , supply for one month via match letter. And after which he wishes to go back on the novolog 70/30 after he has completed the lantus.    Hypertension; well controlled.    NICM:  No new complaints  No chest pain or sob.       Discharge Instructions  Discharge Instructions    Diet - low sodium heart healthy    Complete by:  As directed    Discharge instructions    Complete by:  As directed    Please follow up with PCP before the antibiotics are completed.     Allergies as of 09/29/2016   No Known Allergies     Medication List    STOP taking these medications   aspirin 81 MG chewable tablet   atorvastatin 40 MG tablet Commonly known as:  LIPITOR   ibuprofen 600 MG tablet Commonly known as:  ADVIL,MOTRIN     TAKE these medications   blood glucose meter kit and supplies Dispense based on patient and insurance preference. Use up to four times daily as directed. (FOR ICD-9 250.00, 250.01).   carvedilol 25 MG tablet Commonly known as:  COREG Take 1 tablet (25 mg total) by mouth 2 (two) times daily with a meal.   clindamycin 150 MG capsule Commonly known as:  CLEOCIN Take 2 capsules (300 mg total) by mouth 3 (three) times daily.   furosemide 20 MG tablet Commonly known as:  LASIX Take 1 tablet (20 mg total) by mouth daily.   insulin aspart protamine- aspart (70-30) 100 UNIT/ML injection Commonly known as:  NOVOLOG MIX 70/30 Inject 0.1-0.15 mLs (10-15 Units total) into the skin 2 (two) times daily with a meal. Sliding scale   insulin glargine 100 UNIT/ML injection Commonly known as:  LANTUS Inject 0.15 mLs (15 Units total) into the skin at bedtime. Sliding scale What changed:  how much to take   INSULIN SYRINGE .3CC/31GX5/16" 31G  X 5/16" 0.3 ML Misc Use with lantus, once daily   lisinopril 20 MG tablet Commonly known as:  PRINIVIL,ZESTRIL Take 1 tablet (20 mg total) by mouth daily.   metFORMIN 1000 MG tablet Commonly known as:  GLUCOPHAGE Take 1 tablet (1,000 mg total) by mouth 2 (two) times daily with a meal.   spironolactone 25 MG tablet Commonly known as:  ALDACTONE Take 1 tablet (25 mg total) by mouth daily.      Follow-up Information    Placey, Audrea Muscat, NP. Schedule an appointment as soon as possible for a visit in 1 week(s).   Why:  post hospitalization visit.  Contact information: [place] [address] [phone]          No Known Allergies  Consultations:  None.    Procedures/Studies: Mr Foot Left Wo Contrast  Result Date: 09/28/2016 CLINICAL DATA:  Pain and swelling of the left foot. Cellulitis of the foot with draining wound of the little toe. EXAM: MRI OF THE LEFT FOREFOOT WITHOUT CONTRAST TECHNIQUE: Multiplanar, multisequence MR imaging of the forefoot was performed. No intravenous contrast was administered. COMPARISON:  Radiographs dated 09/22/2016 FINDINGS: Bones/Joint/Cartilage There is no evidence of osteomyelitis or other significant bone abnormality. Slight arthritic changes at the first metatarsophalangeal joint. No joint effusions. Soft tissues Nonspecific soft tissue edema primarily of the dorsum of the foot wound of the little toe. No abscess. IMPRESSION: 1. No evidence of osteomyelitis of the left forefoot. 2. Soft tissue edema, nonspecific. This can be seen with cellulitis. No soft tissue abscesses. Electronically Signed   By: Lorriane Shire M.D.   On: 09/28/2016 09:35   Dg Foot Complete Left  Result Date: 09/22/2016 CLINICAL DATA:  Pain/swelling along 5th digit EXAM: LEFT FOOT - COMPLETE 3+ VIEW COMPARISON:  None. FINDINGS: No fracture or dislocation is seen. The joint spaces are preserved.  No marginal erosions. Mild soft tissue swelling lateral to the 5th MTP joint. IMPRESSION: No acute osseus abnormality is seen. Electronically Signed   By: Julian Hy M.D.   On: 09/22/2016 20:18      Subjective: No chest pain, sob, or cough.  Left foot pain improved.   Discharge Exam: Vitals:   09/29/16 0900 09/29/16 1222  BP: 132/66 138/75  Pulse:  77  Resp:  19  Temp:  98.1 F (36.7 C)   Vitals:   09/28/16 2039 09/29/16 0421 09/29/16 0900 09/29/16 1222  BP: 128/67 130/65 132/66 138/75  Pulse: 82 81  77  Resp: 18 18  19   Temp: 98.1 F (36.7 C) 97.8 F (36.6 C)  98.1 F (36.7 C)  TempSrc: Oral Oral  Oral  SpO2: 100%  99%  100%  Weight:  77.8 kg (171 lb 8.3 oz)    Height:        General: Pt is alert, awake, not in acute distress Cardiovascular: RRR, S1/S2 +, no rubs, no gallops Respiratory: CTA bilaterally, no wheezing, no rhonchi Abdominal: Soft, NT, ND, bowel sounds + Extremities: no edema, no cyanosis    The results of significant diagnostics from this hospitalization (including imaging, microbiology, ancillary and laboratory) are listed below for reference.     Microbiology: Recent Results (from the past 240 hour(s))  MRSA PCR Screening     Status: None   Collection Time: 09/28/16  2:32 AM  Result Value Ref Range Status   MRSA by PCR NEGATIVE NEGATIVE Final    Comment:        The GeneXpert MRSA Assay (  FDA approved for NASAL specimens only), is one component of a comprehensive MRSA colonization surveillance program. It is not intended to diagnose MRSA infection nor to guide or monitor treatment for MRSA infections.      Labs: BNP (last 3 results)  Recent Labs  04/26/16 0554  BNP 937.1*   Basic Metabolic Panel:  Recent Labs Lab 09/27/16 2204 09/28/16 0237 09/29/16 0324  NA 130* 137 134*  K 4.5 3.9 4.7  CL 96* 106 101  CO2 23 25 28   GLUCOSE 526* 152* 279*  BUN 24* 16 12  CREATININE 1.22 0.94 0.89  CALCIUM 9.2 8.4* 8.5*   Liver Function Tests: No results for input(s): AST, ALT, ALKPHOS, BILITOT, PROT, ALBUMIN in the last 168 hours. No results for input(s): LIPASE, AMYLASE in the last 168 hours. No results for input(s): AMMONIA in the last 168 hours. CBC:  Recent Labs Lab 09/27/16 2204 09/28/16 0237 09/29/16 0324  WBC 4.4 3.5* 3.8*  NEUTROABS 2.2  --  1.9  HGB 11.9* 11.8* 11.2*  HCT 37.1* 36.1* 33.9*  MCV 84.5 84.1 83.3  PLT 211 214 202   Cardiac Enzymes: No results for input(s): CKTOTAL, CKMB, CKMBINDEX, TROPONINI in the last 168 hours. BNP: Invalid input(s): POCBNP CBG:  Recent Labs Lab 09/28/16 1122 09/28/16 1624 09/28/16 2111 09/29/16 0603  09/29/16 1120  GLUCAP 207* 157* 201* 269* 202*   D-Dimer No results for input(s): DDIMER in the last 72 hours. Hgb A1c  Recent Labs  09/29/16 0324  HGBA1C 13.1*   Lipid Profile No results for input(s): CHOL, HDL, LDLCALC, TRIG, CHOLHDL, LDLDIRECT in the last 72 hours. Thyroid function studies No results for input(s): TSH, T4TOTAL, T3FREE, THYROIDAB in the last 72 hours.  Invalid input(s): FREET3 Anemia work up No results for input(s): VITAMINB12, FOLATE, FERRITIN, TIBC, IRON, RETICCTPCT in the last 72 hours. Urinalysis    Component Value Date/Time   COLORURINE YELLOW 12/06/2012 1637   APPEARANCEUR CLEAR 12/06/2012 1637   LABSPEC 1.041 (H) 12/06/2012 1637   PHURINE 6.0 12/06/2012 1637   GLUCOSEU >1000 (A) 12/06/2012 1637   HGBUR NEGATIVE 12/06/2012 1637   HGBUR negative 08/06/2008 1124   BILIRUBINUR NEGATIVE 12/06/2012 1637   KETONESUR NEGATIVE 12/06/2012 1637   PROTEINUR NEGATIVE 12/06/2012 1637   UROBILINOGEN 1.0 12/06/2012 1637   NITRITE NEGATIVE 12/06/2012 1637   LEUKOCYTESUR NEGATIVE 12/06/2012 1637   Sepsis Labs Invalid input(s): PROCALCITONIN,  WBC,  LACTICIDVEN Microbiology Recent Results (from the past 240 hour(s))  MRSA PCR Screening     Status: None   Collection Time: 09/28/16  2:32 AM  Result Value Ref Range Status   MRSA by PCR NEGATIVE NEGATIVE Final    Comment:        The GeneXpert MRSA Assay (FDA approved for NASAL specimens only), is one component of a comprehensive MRSA colonization surveillance program. It is not intended to diagnose MRSA infection nor to guide or monitor treatment for MRSA infections.      Time coordinating discharge: Over 30 minutes  SIGNED:   Hosie Poisson, MD  [facility] 09/30/2016, 7:47 AM Pager   If 7PM-7AM, please contact night-coverage [email or web address] Password [email or web address]

## 2016-09-29 NOTE — Progress Notes (Signed)
Inpatient Diabetes Program Recommendations  AACE/ADA: New Consensus Statement on Inpatient Glycemic Control (2015)  Target Ranges:  Prepandial:   less than 140 mg/dL      Peak postprandial:   less than 180 mg/dL (1-2 hours)      Critically ill patients:  140 - 180 mg/dL   Lab Results  Component Value Date   GLUCAP 269 (H) 09/29/2016   HGBA1C 13.2 (H) 04/27/2016    Review of Glycemic Control  Inpatient Diabetes Program Recommendations:  Spoke with patient to clarify what he has been taking @ home. Patient does not have insulin @ home presently and has no insurance. Please consider transition to 70/30 insulin so patient will be able to purchase from [facility] as Novolin 70/30 insulin @ cost of approx. $25 per vial. Reviewed with patient need to get new vial of insulin every month. Please consider now so can see how BG: 70/30 12 units bid ac breakfast & dinner (provides approx 17 units basal and 7.2 units meal coverage)  Reviewed elevated A1c from 04/27/16 of 13.2 and explained what an A1C is, basic pathophysiology of DM Type 2, basic home care, basic diabetes diet nutrition principles, importance of checking CBGs and maintaining good CBG control to prevent long-term and short-term complications. Reviewed signs and symptoms of hyperglycemia and hypoglycemia and how to treat hypoglycemia at home. Also reviewed blood sugar goals at home.  RNs to provide ongoing basic DM education at bedside with this patient.  Patient states he has a glucose meter and strips @ home and checks his CBG bid.   Thank you, Billy Fischer. Dahlton Hinde, RN, MSN, CDE  Diabetes Coordinator Inpatient Glycemic Control Team Team Pager [phone] (8am-5pm) 09/29/2016 1:37 PM

## 2016-09-29 NOTE — Care Management Note (Signed)
Case Management Note Donn Pierini RN, BSN Unit 2W-Case Manager [phone]  Patient Details  Name: James [name] MRN: [id number] Date of Birth: 1960-05-09  Subjective/Objective:   Pt admitted with cellulitis                 Action/Plan: PTA pt lived at home- per pt he goes to the [facility] for f/u- pt will be on oral abx for d/c- MATCH letter given to pt for medication assistance- along with list of pharmacies to use.Program explained to pt with $3 copay cost- pt states he can afford $3. Pt states he needs a script for Lantus and would like a darco shoe will have bedside RN f/u on these items with MD. [facility] has delivered a cane to bedside.   Expected Discharge Date:  09/29/16               Expected Discharge Plan:  Home/Self Care  In-House Referral:     Discharge planning Services  CM Consult, Medication Assistance, MATCH Program  Post Acute Care Choice:  Durable Medical Equipment Choice offered to:  Patient  DME Arranged:  [place] DME Agency:  [facility]  HH Arranged:  NA HH Agency:  NA  Status of Service:  Completed, signed off  If discussed at Long Length of Stay Meetings, dates discussed:    Discharge Disposition: home/self care   Additional Comments:  Darrold Span, RN 09/29/2016, 11:15 AM

## 2016-09-30 ENCOUNTER — Encounter: Payer: Self-pay | Admitting: Cardiology

## 2016-09-30 LAB — HEMOGLOBIN A1C
HEMOGLOBIN A1C: 13.1 % — AB (ref 4.8–5.6)
MEAN PLASMA GLUCOSE: 329 mg/dL

## 2016-10-04 NOTE — Progress Notes (Signed)
HPI: Follow-up cardiomyopathy and congestive heart failure. Patient admitted in January 2018 with dyspnea felt to be a combination of pneumonia and congestive heart failure. Echocardiogram showed severely reduced LV function with ejection fraction 20-25%, restrictive filling, mild left ventricular enlargement, mild to moderate mitral regurgitation and moderately elevated pulmonary pressures. Cardiac catheterization January 2018 showed 50% LAD but otherwise no obstructive coronary disease. Patient was treated with antibiotics and diuretics with improvement. ABIs June 2018 normal. Follow-up echocardiogram July 2018 showed normal LV systolic function and grade 1 diastolic dysfunction. Since last seen, the patient denies any dyspnea on exertion, orthopnea, PND, pedal edema, palpitations, syncope or chest pain.   Current Outpatient Prescriptions  Medication Sig Dispense Refill  . atorvastatin (LIPITOR) 40 MG tablet Take 40 mg by mouth daily.    . blood glucose meter kit and supplies Dispense based on patient and insurance preference. Use up to four times daily as directed. (FOR ICD-9 250.00, 250.01). 1 each 0  . carvedilol (COREG) 25 MG tablet Take 1 tablet (25 mg total) by mouth 2 (two) times daily with a meal. 180 tablet 3  . furosemide (LASIX) 20 MG tablet Take 1 tablet (20 mg total) by mouth daily. 90 tablet 3  . insulin aspart protamine- aspart (NOVOLOG MIX 70/30) (70-30) 100 UNIT/ML injection Inject 0.1-0.15 mLs (10-15 Units total) into the skin 2 (two) times daily with a meal. Sliding scale 10 mL 11  . insulin glargine (LANTUS) 100 UNIT/ML injection Inject 0.15 mLs (15 Units total) into the skin at bedtime. Sliding scale 10 mL 1  . Insulin Syringe-Needle U-100 (INSULIN SYRINGE .3CC/31GX5/16") 31G X 5/16" 0.3 ML MISC Use with lantus, once daily 30 each 0  . lisinopril (PRINIVIL,ZESTRIL) 20 MG tablet Take 1 tablet (20 mg total) by mouth daily. 90 tablet 3  . metFORMIN (GLUCOPHAGE) 1000 MG  tablet Take 1 tablet (1,000 mg total) by mouth 2 (two) times daily with a meal. 60 tablet 0  . spironolactone (ALDACTONE) 25 MG tablet Take 1 tablet (25 mg total) by mouth daily. 90 tablet 3   No current facility-administered medications for this visit.      Past Medical History:  Diagnosis Date  . Cellulitis and abscess of foot 09/29/2016  . Chronic systolic heart failure ([place])   . Depression   . Diabetes mellitus without complication ([place])   . Essential hypertension   . NICM (nonischemic cardiomyopathy) ([place]) EF 20-25%     Past Surgical History:  Procedure Laterality Date  . CARDIAC CATHETERIZATION N/A 04/30/2016   Procedure: Right/Left Heart Cath and Coronary Angiography;  Surgeon: Nelva Bush, MD;  Location: [place] [facility];  Service: Cardiovascular;  Laterality: N/A;  . FINGER SURGERY      Social History   Social History  . Marital status: Married    Spouse name: N/A  . Number of children: N/A  . Years of education: N/A   Occupational History  . Not on file.   Social History Main Topics  . Smoking status: Former Smoker    Packs/day: 0.50    Years: 23.00    Types: Cigarettes    Quit date: 04/26/2016  . Smokeless tobacco: Never Used  . Alcohol use No     Comment: none as of 04/26/16  . Drug use: No     Comment: none as of 02/15/2016  . Sexual activity: Not Currently   Other Topics Concern  . Not on file   Social History Narrative  . No narrative  on file    Family History  Problem Relation Age of Onset  . Diabetes Other   . Hypertension Other   . Diabetes Mother   . Brain cancer Father   . Mental illness Brother   . Drug abuse Brother   . Diabetes Brother     ROS: no fevers or chills, productive cough, hemoptysis, dysphasia, odynophagia, melena, hematochezia, dysuria, hematuria, rash, seizure activity, orthopnea, PND, pedal edema, claudication. Remaining systems are negative.  Physical Exam: Well-developed well-nourished in no acute distress.    Skin is warm and dry.  HEENT is normal.  Neck is supple. No bruit Chest is clear to auscultation with normal expansion.  Cardiovascular exam is regular rate and rhythm.  Abdominal exam nontender or distended. No masses palpated. Extremities show no edema. neuro grossly intact  A/P  1 Nonischemic cardiomyopathy-LV function now normalized. Etiology of previous cardiomyopathy remains unclear. He did not have significant obstructive coronary disease on previous catheterization. Plan to continue medical therapy. Continue carvedilol and lisinopril.   2 coronary artery disease-no chest pain. Nonobstructive disease on previous catheterization. Continue aspirin and statin.  3 chronic systolic congestive heart failure-continue present dose of diuretics. Patient is euvolemic on examination.  4 hypertension-blood pressure controlled. Continue present medications.  5 history of substance abuse-I have again discussed importance of avoiding cocaine.  6 hyperlipidemia-continue statin. Check lipids and liver.  Kirk Ruths, MD

## 2016-10-14 ENCOUNTER — Other Ambulatory Visit: Payer: Self-pay

## 2016-10-14 ENCOUNTER — Ambulatory Visit (HOSPITAL_COMMUNITY): Payer: Self-pay | Attending: Cardiology

## 2016-10-14 DIAGNOSIS — E119 Type 2 diabetes mellitus without complications: Secondary | ICD-10-CM | POA: Insufficient documentation

## 2016-10-14 DIAGNOSIS — I509 Heart failure, unspecified: Secondary | ICD-10-CM

## 2016-10-14 DIAGNOSIS — I5022 Chronic systolic (congestive) heart failure: Secondary | ICD-10-CM | POA: Insufficient documentation

## 2016-10-14 DIAGNOSIS — I428 Other cardiomyopathies: Secondary | ICD-10-CM | POA: Insufficient documentation

## 2016-10-14 DIAGNOSIS — I429 Cardiomyopathy, unspecified: Secondary | ICD-10-CM

## 2016-10-18 ENCOUNTER — Ambulatory Visit (INDEPENDENT_AMBULATORY_CARE_PROVIDER_SITE_OTHER): Payer: Self-pay | Admitting: Cardiology

## 2016-10-18 ENCOUNTER — Encounter: Payer: Self-pay | Admitting: Cardiology

## 2016-10-18 VITALS — BP 102/62 | HR 84 | Ht 72.0 in | Wt 165.0 lb

## 2016-10-18 DIAGNOSIS — E7800 Pure hypercholesterolemia, unspecified: Secondary | ICD-10-CM

## 2016-10-18 DIAGNOSIS — I428 Other cardiomyopathies: Secondary | ICD-10-CM

## 2016-10-18 DIAGNOSIS — I10 Essential (primary) hypertension: Secondary | ICD-10-CM

## 2016-10-18 DIAGNOSIS — I2510 Atherosclerotic heart disease of native coronary artery without angina pectoris: Secondary | ICD-10-CM

## 2016-10-18 NOTE — Patient Instructions (Signed)
Your physician wants you to follow-up in: 6 MONTHS WITH DR CRENSHAW You will receive a reminder letter in the mail two months in advance. If you don't receive a letter, please call our office to schedule the follow-up appointment.   If you need a refill on your cardiac medications before your next appointment, please call your pharmacy.  

## 2017-01-12 DIAGNOSIS — R45851 Suicidal ideations: Secondary | ICD-10-CM | POA: Insufficient documentation

## 2017-01-12 DIAGNOSIS — Z79899 Other long term (current) drug therapy: Secondary | ICD-10-CM | POA: Insufficient documentation

## 2017-01-12 DIAGNOSIS — Z87891 Personal history of nicotine dependence: Secondary | ICD-10-CM | POA: Insufficient documentation

## 2017-01-12 DIAGNOSIS — F329 Major depressive disorder, single episode, unspecified: Secondary | ICD-10-CM | POA: Insufficient documentation

## 2017-01-12 DIAGNOSIS — I5022 Chronic systolic (congestive) heart failure: Secondary | ICD-10-CM | POA: Insufficient documentation

## 2017-01-12 DIAGNOSIS — E119 Type 2 diabetes mellitus without complications: Secondary | ICD-10-CM | POA: Insufficient documentation

## 2017-01-12 DIAGNOSIS — Z794 Long term (current) use of insulin: Secondary | ICD-10-CM | POA: Insufficient documentation

## 2017-01-12 DIAGNOSIS — R739 Hyperglycemia, unspecified: Secondary | ICD-10-CM | POA: Insufficient documentation

## 2017-01-12 DIAGNOSIS — I110 Hypertensive heart disease with heart failure: Secondary | ICD-10-CM | POA: Insufficient documentation

## 2017-01-12 DIAGNOSIS — L02414 Cutaneous abscess of left upper limb: Secondary | ICD-10-CM | POA: Insufficient documentation

## 2017-01-13 ENCOUNTER — Emergency Department (HOSPITAL_COMMUNITY)
Admission: EM | Admit: 2017-01-13 | Discharge: 2017-01-14 | Disposition: A | Discharge status: Other Acute Inpatient Hospital | Payer: Self-pay | Attending: Emergency Medicine | Admitting: Emergency Medicine

## 2017-01-13 DIAGNOSIS — R45851 Suicidal ideations: Secondary | ICD-10-CM

## 2017-01-13 DIAGNOSIS — L02414 Cutaneous abscess of left upper limb: Secondary | ICD-10-CM

## 2017-01-13 DIAGNOSIS — R739 Hyperglycemia, unspecified: Secondary | ICD-10-CM

## 2017-01-13 LAB — COMPREHENSIVE METABOLIC PANEL
ALBUMIN: 3.5 g/dL (ref 3.5–5.0)
ALT: 13 U/L — ABNORMAL LOW (ref 17–63)
ANION GAP: 10 (ref 5–15)
AST: 14 U/L — ABNORMAL LOW (ref 15–41)
Alkaline Phosphatase: 82 U/L (ref 38–126)
BILIRUBIN TOTAL: 0.6 mg/dL (ref 0.3–1.2)
BUN: 13 mg/dL (ref 6–20)
CO2: 25 mmol/L (ref 22–32)
Calcium: 9 mg/dL (ref 8.9–10.3)
Chloride: 95 mmol/L — ABNORMAL LOW (ref 101–111)
Creatinine, Ser: 0.99 mg/dL (ref 0.61–1.24)
GFR calc Af Amer: >60 mL/min (ref >60)
GFR calc non Af Amer: >60 mL/min (ref >60)
GLUCOSE: 586 mg/dL — AB (ref 65–99)
POTASSIUM: 4 mmol/L (ref 3.5–5.1)
Sodium: 130 mmol/L — ABNORMAL LOW (ref 135–145)
TOTAL PROTEIN: 6.4 g/dL — AB (ref 6.5–8.1)

## 2017-01-13 LAB — RAPID URINE DRUG SCREEN, HOSP PERFORMED
Amphetamines: NOT DETECTED
BENZODIAZEPINES: NOT DETECTED
Barbiturates: NOT DETECTED
Cocaine: POSITIVE — AB
OPIATES: NOT DETECTED
Tetrahydrocannabinol: NOT DETECTED

## 2017-01-13 LAB — CBC
HEMATOCRIT: 36.8 % — AB (ref 39.0–52.0)
Hemoglobin: 12.4 g/dL — ABNORMAL LOW (ref 13.0–17.0)
MCH: 28.8 pg (ref 26.0–34.0)
MCHC: 33.7 g/dL (ref 30.0–36.0)
MCV: 85.4 fL (ref 78.0–100.0)
PLATELETS: 231 10*3/uL (ref 150–400)
RBC: 4.31 MIL/uL (ref 4.22–5.81)
RDW: 13.9 % (ref 11.5–15.5)
WBC: 5.1 10*3/uL (ref 4.0–10.5)

## 2017-01-13 LAB — ACETAMINOPHEN LEVEL

## 2017-01-13 LAB — CBG MONITORING, ED
Glucose-Capillary: 490 mg/dL — ABNORMAL HIGH (ref 65–99)
Glucose-Capillary: 279 mg/dL — ABNORMAL HIGH (ref 65–99)
Glucose-Capillary: 160 mg/dL — ABNORMAL HIGH (ref 65–99)

## 2017-01-13 LAB — ETHANOL

## 2017-01-13 LAB — SALICYLATE LEVEL: Salicylate Lvl: <7 mg/dL (ref 2.8–30.0)

## 2017-01-13 MED ORDER — INSULIN ASPART PROT & ASPART (70-30 MIX) 100 UNIT/ML ~~LOC~~ SUSP
10.0000 [IU] | Freq: Two times a day (BID) | SUBCUTANEOUS | Status: DC
  Administered 2017-01-13 – 2017-01-14 (×3): 10 [IU] via SUBCUTANEOUS
  Filled 2017-01-13: qty 10

## 2017-01-13 MED ORDER — FUROSEMIDE 20 MG PO TABS
20.0000 mg | ORAL_TABLET | Freq: Every day | ORAL | Status: DC
  Administered 2017-01-13 – 2017-01-14 (×2): 20 mg via ORAL
  Filled 2017-01-13 (×2): qty 1

## 2017-01-13 MED ORDER — LIDOCAINE-PRILOCAINE 2.5-2.5 % EX CREA
TOPICAL_CREAM | Freq: Once | CUTANEOUS | Status: AC
  Administered 2017-01-13: 1 via TOPICAL
  Filled 2017-01-13: qty 5

## 2017-01-13 MED ORDER — INSULIN GLARGINE 100 UNIT/ML ~~LOC~~ SOLN
15.0000 [IU] | Freq: Every day | SUBCUTANEOUS | Status: DC
  Administered 2017-01-13: 15 [IU] via SUBCUTANEOUS
  Filled 2017-01-13 (×2): qty 0.15

## 2017-01-13 MED ORDER — LIDOCAINE-EPINEPHRINE (PF) 2 %-1:200000 IJ SOLN
10.0000 mL | Freq: Once | INTRAMUSCULAR | Status: AC
  Administered 2017-01-13: 10 mL
  Filled 2017-01-13: qty 20

## 2017-01-13 MED ORDER — ACETAMINOPHEN 325 MG PO TABS
650.0000 mg | ORAL_TABLET | Freq: Once | ORAL | Status: AC
  Administered 2017-01-13: 650 mg via ORAL
  Filled 2017-01-13: qty 2

## 2017-01-13 MED ORDER — METFORMIN HCL 500 MG PO TABS
1000.0000 mg | ORAL_TABLET | Freq: Two times a day (BID) | ORAL | Status: DC
  Administered 2017-01-13 – 2017-01-14 (×3): 1000 mg via ORAL
  Filled 2017-01-13 (×3): qty 2

## 2017-01-13 MED ORDER — LIDOCAINE-PRILOCAINE 2.5-2.5 % EX CREA: TOPICAL_CREAM | Freq: Once | CUTANEOUS | Status: DC

## 2017-01-13 MED ORDER — SPIRONOLACTONE 25 MG PO TABS
25.0000 mg | ORAL_TABLET | Freq: Every day | ORAL | Status: DC
  Administered 2017-01-13 – 2017-01-14 (×2): 25 mg via ORAL
  Filled 2017-01-13 (×3): qty 1

## 2017-01-13 MED ORDER — IBUPROFEN 400 MG PO TABS
600.0000 mg | ORAL_TABLET | Freq: Once | ORAL | Status: AC
  Administered 2017-01-13: 600 mg via ORAL
  Filled 2017-01-13: qty 1

## 2017-01-13 MED ORDER — INSULIN ASPART 100 UNIT/ML ~~LOC~~ SOLN
10.0000 [IU] | Freq: Once | SUBCUTANEOUS | Status: AC
  Administered 2017-01-13: 10 [IU] via SUBCUTANEOUS
  Filled 2017-01-13: qty 1

## 2017-01-13 MED ORDER — CARVEDILOL 12.5 MG PO TABS
25.0000 mg | ORAL_TABLET | Freq: Two times a day (BID) | ORAL | Status: DC
  Administered 2017-01-13 – 2017-01-14 (×3): 25 mg via ORAL
  Filled 2017-01-13 (×3): qty 2

## 2017-01-13 MED ORDER — ACETAMINOPHEN 325 MG PO TABS
650.0000 mg | ORAL_TABLET | ORAL | Status: DC | PRN
  Administered 2017-01-13: 650 mg via ORAL
  Filled 2017-01-13: qty 2

## 2017-01-13 MED ORDER — ATORVASTATIN CALCIUM 40 MG PO TABS
40.0000 mg | ORAL_TABLET | Freq: Every day | ORAL | Status: DC
  Administered 2017-01-13 – 2017-01-14 (×2): 40 mg via ORAL
  Filled 2017-01-13 (×4): qty 1

## 2017-01-13 NOTE — ED Notes (Signed)
Dressing on left arm changed

## 2017-01-13 NOTE — BH Assessment (Addendum)
Tele Assessment Note   Patient Name: James [name] MRN: [id number] Referring Physician: Zadie Rhine, MD Location of Patient: [facility] Location of Provider: Behavioral Health TTS Department  James [name] is a 56 y.o. male who pesents voluntarily to ED due to SI associated with a recent relapse on cocaine. Pt reports that he was sober for 8 months and doing well. Pt continues that he became comfortable and felt that he could pull back on his meetings and other efforts to stay clean. Pt reports that he doesn't even know how the relapse happened and was in disbelief that it did happen. Pt expresses remorse for the effect his drug use has on his family and reports having SI for @ a week. Pt initially reports passive SI, such as "I wish God would just kill me", but when asked about associated suicidal plans, pt says that he had thought about ODing on his meds or cutting himself with a box cutter he has. Pt is not taking any psych meds currently. He denies HI or AVH.    Shuvon Rankin, NP, recommends IP treatment.   Diagnosis: MDD, recurrent episode, severe; Cocaine use d/o, severe  Past Medical History:  Past Medical History:  Diagnosis Date  . Cellulitis and abscess of foot 09/29/2016  . Chronic systolic heart failure ([facility])   . Depression   . Diabetes mellitus without complication ([facility])   . Essential hypertension   . NICM (nonischemic cardiomyopathy) ([facility]) EF 20-25%     Past Surgical History:  Procedure Laterality Date  . CARDIAC CATHETERIZATION N/A 04/30/2016   Procedure: Right/Left Heart Cath and Coronary Angiography;  Surgeon: Yvonne Kendall, MD;  Location: [facility] INVASIVE CV LAB;  Service: Cardiovascular;  Laterality: N/A;  . FINGER SURGERY      Family History:  Family History  Problem Relation Age of Onset  . Diabetes Other   . Hypertension Other   . Diabetes Mother   . Brain cancer Father   . Mental illness Brother   . Drug abuse Brother   . Diabetes Brother     Social  History:  reports that he quit smoking about 8 months ago. His smoking use included Cigarettes. He has a 11.50 pack-year smoking history. He has never used smokeless tobacco. He reports that he does not drink alcohol or use drugs.  Additional Social History:  Alcohol / Drug Use Pain Medications: see PTA meds Prescriptions: see PTA meds Over the Counter: see PTA meds History of alcohol / drug use?: Yes Longest period of sobriety (when/how long): 6 years Negative Consequences of Use: Work / Programmer, multimedia, Copywriter, advertising relationships, Surveyor, quantity Withdrawal Symptoms: Agitation, Sweats Substance #1 Name of Substance 1: crack cocaine 1 - Age of First Use: been using for over 30 years 1 - Frequency: e/o day 1 - Duration: ongoing since July 2018  CIWA: CIWA-Ar BP: 134/76 Pulse Rate: 88 COWS:    PATIENT STRENGTHS: (choose at least two) Average or above average intelligence Capable of independent living Motivation for treatment/growth  Allergies: No Known Allergies  Home Medications:  (Not in a hospital admission)  OB/GYN Status:  No LMP for male patient.  General Assessment Data Location of Assessment: [facility] ED TTS Assessment: In system Is this a Tele or Face-to-Face Assessment?: Tele Assessment Is this an Initial Assessment or a Re-assessment for this encounter?: Initial Assessment Marital status: Married Living Arrangements: Spouse/significant other Can pt return to current living arrangement?: Yes Admission Status: Voluntary Is patient capable of signing voluntary admission?: Yes Referral Source: Self/Family/Friend  Insurance type: none     Crisis Care Plan Living Arrangements: Spouse/significant other Name of Psychiatrist: none Name of Therapist: [facility]  Education Status Is patient currently in school?: No  Risk to self with the past 6 months Suicidal Ideation: Yes-Currently Present Has patient been a risk to self within the past 6 months prior to  admission? : No Suicidal Intent: Yes-Currently Present Has patient had any suicidal intent within the past 6 months prior to admission? : No Is patient at risk for suicide?: Yes Suicidal Plan?: Yes-Currently Present Has patient had any suicidal plan within the past 6 months prior to admission? : No Specify Current Suicidal Plan: OD or cut self Access to Means: Yes Specify Access to Suicidal Means: has meds and box cutter Previous Attempts/Gestures: Yes How many times?: 1 Triggers for Past Attempts: Other (Comment) (chronic drug use) Intentional Self Injurious Behavior: None Family Suicide History: Unknown Recent stressful life event(s): Other (Comment) (drug relapse) Persecutory voices/beliefs?: No Depression: Yes Depression Symptoms: Guilt, Feeling worthless/self pity Substance abuse history and/or treatment for substance abuse?: Yes Suicide prevention information given to non-admitted patients: Not applicable  Risk to Others within the past 6 months Homicidal Ideation: No Does patient have any lifetime risk of violence toward others beyond the six months prior to admission? : No Thoughts of Harm to Others: No Current Homicidal Intent: No Current Homicidal Plan: No Access to Homicidal Means: No History of harm to others?: No Assessment of Violence: None Noted Does patient have access to weapons?: No Criminal Charges Pending?: Yes Describe Pending Criminal Charges: Felony larceny of motor vehicle, possession of stolen motor vehicle Does patient have a court date: Yes Court Date: 01/18/17 Is patient on probation?: Yes  Psychosis Hallucinations: None noted Delusions: None noted  Mental Status Report Appearance/Hygiene: Unremarkable Eye Contact: Fair Motor Activity: Unremarkable Speech: Logical/coherent Level of Consciousness: Quiet/awake Mood: Depressed, Pleasant Affect: Appropriate to circumstance Anxiety Level: Minimal Thought Processes: Coherent,  Relevant Judgement: Partial Orientation: Person, Place, Time, Situation Obsessive Compulsive Thoughts/Behaviors: None  Cognitive Functioning Concentration: Normal Memory: Recent Intact, Remote Intact IQ: Average Insight: Fair Impulse Control: Fair Appetite: Fair Sleep: No Change Vegetative Symptoms: None  ADLScreening [facility] Assessment Services) Patient's cognitive ability adequate to safely complete daily activities?: Yes Patient able to express need for assistance with ADLs?: Yes Independently performs ADLs?: Yes (appropriate for developmental age)  Prior Inpatient Therapy Prior Inpatient Therapy: Yes Prior Therapy Dates: 2017 Prior Therapy Facilty/Provider(s): [facility] Reason for Treatment: drug use; depression  Prior Outpatient Therapy Prior Outpatient Therapy: No Does patient have an ACCT team?: No Does patient have Intensive In-House Services?  : No Does patient have [facility] services? : No Does patient have P4CC services?: No  ADL Screening (condition at time of admission) Patient's cognitive ability adequate to safely complete daily activities?: Yes Is the patient deaf or have difficulty hearing?: No Does the patient have difficulty seeing, even when wearing glasses/contacts?: No Does the patient have difficulty concentrating, remembering, or making decisions?: No Patient able to express need for assistance with ADLs?: Yes Does the patient have difficulty dressing or bathing?: No Independently performs ADLs?: Yes (appropriate for developmental age) Does the patient have difficulty walking or climbing stairs?: No Weakness of Legs: None Weakness of Arms/Hands: None  Home Assistive Devices/Equipment Home Assistive Devices/Equipment: None  Therapy Consults (therapy consults require a physician order) PT Evaluation Needed: No OT Evalulation Needed: No SLP Evaluation Needed: No Abuse/Neglect Assessment (Assessment to be complete while patient is  alone) Physical  Abuse: Denies Verbal Abuse: Denies Sexual Abuse: Denies Exploitation of patient/patient's resources: Denies Self-Neglect: Denies Values / Beliefs Cultural Requests During Hospitalization: None Spiritual Requests During Hospitalization: None   Advance Directives (For Healthcare) Does Patient Have a Medical Advance Directive?: No Nutrition Screen- [facility] Adult/[facility]/AP Patient's home diet: Regular Has the patient recently lost weight without trying?: No Has the patient been eating poorly because of a decreased appetite?: No Malnutrition Screening Tool Score: 0  Additional Information 1:1 In Past 12 Months?: No CIRT Risk: No Elopement Risk: No Does patient have medical clearance?: Yes     Disposition:  Disposition Initial Assessment Completed for this Encounter: Yes  This service was provided via telemedicine using a 2-way, interactive audio and video technology.  Names of all persons participating in this telemedicine service and their role in this encounter.   Laddie Aquas 01/13/2017 2:06 PM

## 2017-01-13 NOTE — ED Provider Notes (Signed)
History DM, blood sugars high, hco3 normal.  It appears on review chart, pt also supposed to be taking lantus 15 units qhs - will add.      Cathren Laine, MD 01/13/17 [phone]

## 2017-01-13 NOTE — ED Provider Notes (Signed)
[facility] DEPT Provider Note   CSN: [id number] Arrival date & time: 01/12/17  2359     History   Chief Complaint Chief Complaint  Patient presents with  . Suicidal  . Abscess    HPI James [name] is a 56 y.o. male.  The history is provided by the patient.  Abscess  Location:  Shoulder/arm Shoulder/arm abscess location:  L forearm Abscess quality: painful, redness and warmth   Abscess quality: no induration   Progression:  Worsening Pain details:    Quality:  Pressure   Severity:  Severe   Timing:  Constant   Progression:  Worsening Chronicity:  New Context: diabetes   Relieved by:  Nothing Exacerbated by: pressure/movement. Associated symptoms: no fever   pt reports recent relapse with crack cocaine He is now feeling suicidal He reports feeling depressed/suicidal for past 2 days He does not have a plan Pt also reports abscess to left forearm for past several days He denies IVDU  Past Medical History:  Diagnosis Date  . Cellulitis and abscess of foot 09/29/2016  . Chronic systolic heart failure ([place])   . Depression   . Diabetes mellitus without complication ([place])   . Essential hypertension   . NICM (nonischemic cardiomyopathy) ([place]) EF 20-25%     Patient Active Problem List   Diagnosis Date Noted  . Cellulitis of left foot 09/28/2016  . Cellulitis 09/28/2016  . Cardiomyopathy- suspect NICM but etiology not yet determined 04/27/2016  . Acute combined systolic and diastolic heart failure ([place]) 04/27/2016  . History of major depression 04/27/2016  . Community acquired pneumonia 04/26/2016  . Acute respiratory failure with hypoxia ([place]) 04/26/2016  . Controlled type 2 diabetes mellitus with hyperglycemia ([place]) 04/26/2016  . Uncontrolled type 2 diabetes mellitus with hyperglycemia ([place]) 04/26/2016  . Alcohol use disorder, mild, abuse 12/12/2015  . MDD (major depressive disorder), recurrent severe, without psychosis ([place]) 06/26/2015  . History of  cocaine abuse 06/25/2015  . SPONDYLOSIS, CERVICAL, WITH RADICULOPATHY 02/25/2009  . MUSCLE SPASM, BACK 02/12/2009  . LEUKOPENIA, MILD 08/19/2008  . ECZEMA, HANDS 08/06/2008  . HYPERCHOLESTEROLEMIA 05/09/2007  . SINUSITIS, CHRONIC 05/01/2007  . ALLERGIC RHINITIS 12/21/2006  . Essential hypertension 12/07/2006    Past Surgical History:  Procedure Laterality Date  . CARDIAC CATHETERIZATION N/A 04/30/2016   Procedure: Right/Left Heart Cath and Coronary Angiography;  Surgeon: Nelva Bush, MD;  Location: [place] CV LAB;  Service: Cardiovascular;  Laterality: N/A;  . FINGER SURGERY         Home Medications    Prior to Admission medications   Medication Sig Start Date End Date Taking? Authorizing Provider  atorvastatin (LIPITOR) 40 MG tablet Take 40 mg by mouth daily.   Yes [provider]  carvedilol (COREG) 25 MG tablet Take 1 tablet (25 mg total) by mouth 2 (two) times daily with a meal. 05/28/16  Yes Almyra Deforest, PA  furosemide (LASIX) 20 MG tablet Take 1 tablet (20 mg total) by mouth daily. 05/28/16  Yes Almyra Deforest, PA  insulin aspart protamine- aspart (NOVOLOG MIX 70/30) (70-30) 100 UNIT/ML injection Inject 0.1-0.15 mLs (10-15 Units total) into the skin 2 (two) times daily with a meal. Sliding scale 09/29/16  Yes Hosie Poisson, MD  insulin glargine (LANTUS) 100 UNIT/ML injection Inject 0.15 mLs (15 Units total) into the skin at bedtime. Sliding scale 09/29/16  Yes Hosie Poisson, MD  lisinopril (PRINIVIL,ZESTRIL) 20 MG tablet Take 1 tablet (20 mg total) by mouth daily. 05/14/16  Yes Lelon Perla, MD  metFORMIN (GLUCOPHAGE) 1000 MG tablet Take 1 tablet (1,000 mg total) by mouth 2 (two) times daily with a meal. 05/02/16  Yes Dessa Phi Chahn-Yang, DO  spironolactone (ALDACTONE) 25 MG tablet Take 1 tablet (25 mg total) by mouth daily. 05/28/16 01/13/17 Yes Almyra Deforest, PA  blood glucose meter kit and supplies Dispense based on patient and insurance preference. Use up to four  times daily as directed. (FOR ICD-9 250.00, 250.01). 05/02/16   Dessa Phi Chahn-Yang, DO  Insulin Syringe-Needle U-100 (INSULIN SYRINGE .3CC/31GX5/16") 31G X 5/16" 0.3 ML MISC Use with lantus, once daily 05/02/16   Shon Millet, DO    Family History Family History  Problem Relation Age of Onset  . Diabetes Other   . Hypertension Other   . Diabetes Mother   . Brain cancer Father   . Mental illness Brother   . Drug abuse Brother   . Diabetes Brother     Social History Social History  Substance Use Topics  . Smoking status: Former Smoker    Packs/day: 0.50    Years: 23.00    Types: Cigarettes    Quit date: 04/26/2016  . Smokeless tobacco: Never Used  . Alcohol use No     Comment: none as of 04/26/16     Allergies   Patient has no known allergies.   Review of Systems Review of Systems  Constitutional: Negative for fever.  Cardiovascular: Negative for chest pain.  Gastrointestinal: Negative for abdominal pain.  Skin: Positive for wound.  Psychiatric/Behavioral: Positive for suicidal ideas. The patient is nervous/anxious.   All other systems reviewed and are negative.    Physical Exam Updated Vital Signs BP 126/66 (BP Location: Left Arm)   Pulse (!) 103   Temp 97.6 F (36.4 C) (Oral)   Resp 18   SpO2 97%   Physical Exam CONSTITUTIONAL: Disheveled, anxious HEAD: Normocephalic/atraumatic EYES: EOMI ENMT: Mucous membranes moist NECK: supple no meningeal signs CV: S1/S2 noted, no murmurs/rubs/gallops noted LUNGS: Lungs are clear to auscultation bilaterally, no apparent distress ABDOMEN: soft, nontender GU:no cva tenderness NEURO: Pt is awake/alert/appropriate, moves all extremitiesx4.  No facial droop.   EXTREMITIES: pulses normal/equal, full ROM Abscess to left forearm, no crepitus/drainage, see photo SKIN: warm, color normal, see photo PSYCH: anxious   Left forearm  Patient gave verbal permission to utilize photo for medical documentation  only The image was not stored on any personal device    ED Treatments / Results  Labs (all labs ordered are listed, but only abnormal results are displayed) Labs Reviewed  COMPREHENSIVE METABOLIC PANEL - Abnormal; Notable for the following:       Result Value   Sodium 130 (*)    Chloride 95 (*)    Glucose, Bld 586 (*)    Total Protein 6.4 (*)    AST 14 (*)    ALT 13 (*)    All other components within normal limits  ACETAMINOPHEN LEVEL - Abnormal; Notable for the following:    Acetaminophen (Tylenol), Serum <10 (*)    All other components within normal limits  CBC - Abnormal; Notable for the following:    Hemoglobin 12.4 (*)    HCT 36.8 (*)    All other components within normal limits  RAPID URINE DRUG SCREEN, HOSP PERFORMED - Abnormal; Notable for the following:    Cocaine POSITIVE (*)    All other components within normal limits  CBG MONITORING, ED - Abnormal; Notable for the following:    Glucose-Capillary 490 (*)  All other components within normal limits  ETHANOL  SALICYLATE LEVEL    EKG  EKG Interpretation None       Radiology No results found.  Procedures Procedures  INCISION AND DRAINAGE Performed by: Sharyon Cable Consent: Verbal consent obtained. Risks and benefits: risks, benefits and alternatives were discussed Type: abscess  Body area: left forearm  Anesthesia: local infiltration  Incision was made with a scalpel.  Local anesthetic: lidocaine % with epinephrine EMLA Anesthetic total: 3 ml  Complexity: complex Blunt dissection to break up loculations  Drainage: purulent  Drainage amount: moderate    Patient tolerance: Patient tolerated the procedure well with no immediate complications.    Medications Ordered in ED Medications  insulin aspart (novoLOG) injection 10 Units (not administered)  atorvastatin (LIPITOR) tablet 40 mg (not administered)  carvedilol (COREG) tablet 25 mg (not administered)  furosemide (LASIX)  tablet 20 mg (not administered)  insulin aspart protamine- aspart (NOVOLOG MIX 70/30) injection 10 Units (not administered)  metFORMIN (GLUCOPHAGE) tablet 1,000 mg (not administered)  spironolactone (ALDACTONE) tablet 25 mg (not administered)  acetaminophen (TYLENOL) tablet 650 mg (not administered)  acetaminophen (TYLENOL) tablet 650 mg (650 mg Oral Given 01/13/17 0453)  lidocaine-prilocaine (EMLA) cream (1 application Topical Given 01/13/17 0556)  lidocaine-EPINEPHrine (XYLOCAINE W/EPI) 2 %-1:200000 (PF) injection 10 mL (10 mLs Other Given by Other 01/13/17 0600)  ibuprofen (ADVIL,MOTRIN) tablet 600 mg (600 mg Oral Given 01/13/17 0600)     Initial Impression / Assessment and Plan / ED Course  I have reviewed the triage vital signs and the nursing notes.  Pertinent labs  results that were available during my care of the patient were reviewed by me and considered in my medical decision making (see chart for details).     6:50 AM Pt in the ED after drug relapse and feeling suicidal He also has abscess to left forearm This was drained without incident No crepitus or deformities noted Home meds ordered, he will need improved glucose contorl Otherwise, he is medically stable   Final Clinical Impressions(s) / ED Diagnoses   Final diagnoses:  Suicidal ideation  Abscess of left forearm  Hyperglycemia    New Prescriptions New Prescriptions   No medications on file     Ripley Fraise, MD 01/13/17 [phone]

## 2017-01-13 NOTE — ED Triage Notes (Signed)
Pt has recent relapse to crack cocaine after 8 months of sobriety. Pt reports depression and suicidal ideations for the past two days without plan; believes thoughts have been brought on due to addiction. Pt also has large abscess to L forearm with redness and warmth to area.

## 2017-01-13 NOTE — ED Notes (Signed)
Pt placed in maroon scrubs, wanded by security, and belongings in locker 2.

## 2017-01-14 ENCOUNTER — Inpatient Hospital Stay (HOSPITAL_COMMUNITY)
Admission: AD | Admit: 2017-01-14 | Discharge: 2017-01-18 | DRG: 881 | Disposition: A | Discharge status: Home / Self Care | Payer: Federal, State, Local not specified - Other | Source: Intra-hospital | Attending: Psychiatry | Admitting: Psychiatry

## 2017-01-14 ENCOUNTER — Encounter (HOSPITAL_COMMUNITY): Payer: Self-pay

## 2017-01-14 DIAGNOSIS — F1010 Alcohol abuse, uncomplicated: Secondary | ICD-10-CM | POA: Diagnosis present

## 2017-01-14 DIAGNOSIS — F1910 Other psychoactive substance abuse, uncomplicated: Secondary | ICD-10-CM | POA: Diagnosis not present

## 2017-01-14 DIAGNOSIS — F329 Major depressive disorder, single episode, unspecified: Principal | ICD-10-CM | POA: Diagnosis present

## 2017-01-14 DIAGNOSIS — I5022 Chronic systolic (congestive) heart failure: Secondary | ICD-10-CM | POA: Diagnosis present

## 2017-01-14 DIAGNOSIS — F909 Attention-deficit hyperactivity disorder, unspecified type: Secondary | ICD-10-CM | POA: Diagnosis present

## 2017-01-14 DIAGNOSIS — Z87891 Personal history of nicotine dependence: Secondary | ICD-10-CM | POA: Diagnosis not present

## 2017-01-14 DIAGNOSIS — E119 Type 2 diabetes mellitus without complications: Secondary | ICD-10-CM | POA: Diagnosis present

## 2017-01-14 DIAGNOSIS — Z794 Long term (current) use of insulin: Secondary | ICD-10-CM

## 2017-01-14 DIAGNOSIS — F419 Anxiety disorder, unspecified: Secondary | ICD-10-CM | POA: Diagnosis present

## 2017-01-14 DIAGNOSIS — Z87898 Personal history of other specified conditions: Secondary | ICD-10-CM

## 2017-01-14 DIAGNOSIS — I429 Cardiomyopathy, unspecified: Secondary | ICD-10-CM | POA: Diagnosis present

## 2017-01-14 DIAGNOSIS — R45851 Suicidal ideations: Secondary | ICD-10-CM | POA: Diagnosis present

## 2017-01-14 DIAGNOSIS — L02414 Cutaneous abscess of left upper limb: Secondary | ICD-10-CM | POA: Diagnosis present

## 2017-01-14 DIAGNOSIS — Y900 Blood alcohol level of less than 20 mg/100 ml: Secondary | ICD-10-CM | POA: Diagnosis present

## 2017-01-14 DIAGNOSIS — S61401A Unspecified open wound of right hand, initial encounter: Secondary | ICD-10-CM | POA: Diagnosis not present

## 2017-01-14 DIAGNOSIS — F332 Major depressive disorder, recurrent severe without psychotic features: Secondary | ICD-10-CM | POA: Diagnosis not present

## 2017-01-14 DIAGNOSIS — Z818 Family history of other mental and behavioral disorders: Secondary | ICD-10-CM | POA: Diagnosis not present

## 2017-01-14 DIAGNOSIS — G4700 Insomnia, unspecified: Secondary | ICD-10-CM | POA: Diagnosis present

## 2017-01-14 DIAGNOSIS — I110 Hypertensive heart disease with heart failure: Secondary | ICD-10-CM | POA: Diagnosis present

## 2017-01-14 DIAGNOSIS — F1414 Cocaine abuse with cocaine-induced mood disorder: Secondary | ICD-10-CM | POA: Diagnosis not present

## 2017-01-14 DIAGNOSIS — Z653 Problems related to other legal circumstances: Secondary | ICD-10-CM | POA: Diagnosis not present

## 2017-01-14 DIAGNOSIS — Z7984 Long term (current) use of oral hypoglycemic drugs: Secondary | ICD-10-CM

## 2017-01-14 LAB — GLUCOSE, CAPILLARY
GLUCOSE-CAPILLARY: 294 mg/dL — AB (ref 65–99)
GLUCOSE-CAPILLARY: 141 mg/dL — AB (ref 65–99)

## 2017-01-14 LAB — CBG MONITORING, ED
Glucose-Capillary: 193 mg/dL — ABNORMAL HIGH (ref 65–99)
Glucose-Capillary: 233 mg/dL — ABNORMAL HIGH (ref 65–99)

## 2017-01-14 MED ORDER — ATORVASTATIN CALCIUM 40 MG PO TABS
40.0000 mg | ORAL_TABLET | Freq: Every day | ORAL | Status: DC
  Administered 2017-01-15 – 2017-01-18 (×4): 40 mg via ORAL
  Filled 2017-01-14: qty 1
  Filled 2017-01-14 (×2): qty 7
  Filled 2017-01-14 (×5): qty 1
  Filled 2017-01-14: qty 7

## 2017-01-14 MED ORDER — ACETAMINOPHEN 325 MG PO TABS
650.0000 mg | ORAL_TABLET | ORAL | Status: DC | PRN
  Administered 2017-01-14 – 2017-01-18 (×11): 650 mg via ORAL
  Filled 2017-01-14 (×11): qty 2

## 2017-01-14 MED ORDER — INSULIN GLARGINE 100 UNIT/ML ~~LOC~~ SOLN
15.0000 [IU] | Freq: Every day | SUBCUTANEOUS | Status: DC
  Administered 2017-01-14 – 2017-01-17 (×4): 15 [IU] via SUBCUTANEOUS
  Filled 2017-01-14: qty 0.15

## 2017-01-14 MED ORDER — CARVEDILOL 25 MG PO TABS
25.0000 mg | ORAL_TABLET | Freq: Two times a day (BID) | ORAL | Status: DC
  Administered 2017-01-14 – 2017-01-18 (×8): 25 mg via ORAL
  Filled 2017-01-14 (×4): qty 1
  Filled 2017-01-14: qty 14
  Filled 2017-01-14: qty 2
  Filled 2017-01-14: qty 1
  Filled 2017-01-14: qty 14
  Filled 2017-01-14 (×5): qty 1

## 2017-01-14 MED ORDER — SPIRONOLACTONE 25 MG PO TABS
25.0000 mg | ORAL_TABLET | Freq: Every day | ORAL | Status: DC
  Administered 2017-01-15 – 2017-01-18 (×4): 25 mg via ORAL
  Filled 2017-01-14: qty 7
  Filled 2017-01-14 (×5): qty 1

## 2017-01-14 MED ORDER — MAGNESIUM HYDROXIDE 400 MG/5ML PO SUSP: 30.0000 mL | Freq: Every day | ORAL | Status: DC | PRN

## 2017-01-14 MED ORDER — METFORMIN HCL 500 MG PO TABS
1000.0000 mg | ORAL_TABLET | Freq: Two times a day (BID) | ORAL | Status: DC
  Administered 2017-01-14 – 2017-01-18 (×8): 1000 mg via ORAL
  Filled 2017-01-14 (×2): qty 2
  Filled 2017-01-14: qty 28
  Filled 2017-01-14 (×7): qty 2
  Filled 2017-01-14: qty 28
  Filled 2017-01-14 (×2): qty 2

## 2017-01-14 MED ORDER — INSULIN ASPART PROT & ASPART (70-30 MIX) 100 UNIT/ML ~~LOC~~ SUSP
10.0000 [IU] | Freq: Two times a day (BID) | SUBCUTANEOUS | Status: DC
  Administered 2017-01-14: 10 [IU] via SUBCUTANEOUS

## 2017-01-14 MED ORDER — ALUM & MAG HYDROXIDE-SIMETH 200-200-20 MG/5ML PO SUSP: 30.0000 mL | ORAL | Status: DC | PRN

## 2017-01-14 MED ORDER — FUROSEMIDE 20 MG PO TABS
20.0000 mg | ORAL_TABLET | Freq: Every day | ORAL | Status: DC
  Administered 2017-01-15 – 2017-01-18 (×4): 20 mg via ORAL
  Filled 2017-01-14 (×3): qty 1
  Filled 2017-01-14: qty 7
  Filled 2017-01-14 (×2): qty 1

## 2017-01-14 NOTE — Progress Notes (Signed)
James [name] is a 56 year old male being admitted voluntarily to 304-1 from [facility].  He came in for suicidal ideation and relapse this past July on cocaine.  He reported plan to cut wrist or OD on medications.  He reported stressors with his relationship with wife and upcoming court date.  He reported feeling depressed and hopeless.  He denies HI or A/V hallucinations.  He has history of diabetes, CHF, hypertension and cardiomyopathy.  He continues to voice suicidal ideation that comes and goes and he will contract for safety on the unit.  Oriented him to the unit.  Admission paperwork completed and signed.  Belongings searched and secured in locker # 41.  Skin assessment completed and noted abscess on left forearm.  Q 15 minute checks initiated for safety.  We will monitor the progress towards his goals.

## 2017-01-14 NOTE — Tx Team (Signed)
Initial Treatment Plan 01/14/2017 4:22 PM James [name] MVE:[id number]    PATIENT STRESSORS: Financial difficulties Health problems Marital or family conflict Substance abuse   PATIENT STRENGTHS: Wellsite geologist fund of knowledge Motivation for treatment/growth Work skills   PATIENT IDENTIFIED PROBLEMS: Depression  Suicidal ideation  Substance abuse  "Get back on track"  "I want to continue some outpatient treatment, hopefully at [facility]"             DISCHARGE CRITERIA:  Improved stabilization in mood, thinking, and/or behavior Verbal commitment to aftercare and medication compliance Withdrawal symptoms are absent or subacute and managed without 24-hour nursing intervention  PRELIMINARY DISCHARGE PLAN: Outpatient therapy Medication management  PATIENT/FAMILY INVOLVEMENT: This treatment plan has been presented to and reviewed with the patient, James [name].  The patient and family have been given the opportunity to ask questions and make suggestions.  Levin Bacon, RN 01/14/2017, 4:22 PM

## 2017-01-14 NOTE — Progress Notes (Signed)
Per Berneice Heinrich , [facility], patient has been accepted to [facility], bed 304-1 ; Accepting provider is Assunta Found, NP; Attending provider is Dr. Jama Flavors.   Patient can arrive now, the bed is ready. Number for report is [phone].   Augustin Coupe, RN notified.   Baldo Daub MSW, LCSWA CSW Disposition [phone]

## 2017-01-15 DIAGNOSIS — F419 Anxiety disorder, unspecified: Secondary | ICD-10-CM

## 2017-01-15 DIAGNOSIS — Z818 Family history of other mental and behavioral disorders: Secondary | ICD-10-CM

## 2017-01-15 DIAGNOSIS — R4587 Impulsiveness: Secondary | ICD-10-CM

## 2017-01-15 DIAGNOSIS — S61401A Unspecified open wound of right hand, initial encounter: Secondary | ICD-10-CM

## 2017-01-15 DIAGNOSIS — R45851 Suicidal ideations: Secondary | ICD-10-CM

## 2017-01-15 DIAGNOSIS — Z813 Family history of other psychoactive substance abuse and dependence: Secondary | ICD-10-CM

## 2017-01-15 DIAGNOSIS — F17210 Nicotine dependence, cigarettes, uncomplicated: Secondary | ICD-10-CM

## 2017-01-15 DIAGNOSIS — R454 Irritability and anger: Secondary | ICD-10-CM

## 2017-01-15 DIAGNOSIS — F22 Delusional disorders: Secondary | ICD-10-CM

## 2017-01-15 DIAGNOSIS — F1414 Cocaine abuse with cocaine-induced mood disorder: Secondary | ICD-10-CM

## 2017-01-15 DIAGNOSIS — R4584 Anhedonia: Secondary | ICD-10-CM

## 2017-01-15 DIAGNOSIS — F332 Major depressive disorder, recurrent severe without psychotic features: Secondary | ICD-10-CM

## 2017-01-15 LAB — GLUCOSE, CAPILLARY
GLUCOSE-CAPILLARY: 273 mg/dL — AB (ref 65–99)
Glucose-Capillary: 189 mg/dL — ABNORMAL HIGH (ref 65–99)
Glucose-Capillary: 244 mg/dL — ABNORMAL HIGH (ref 65–99)

## 2017-01-15 MED ORDER — BUPROPION HCL ER (XL) 150 MG PO TB24
150.0000 mg | ORAL_TABLET | Freq: Every day | ORAL | Status: DC
  Administered 2017-01-15 – 2017-01-18 (×4): 150 mg via ORAL
  Filled 2017-01-15: qty 1
  Filled 2017-01-15: qty 7
  Filled 2017-01-15 (×4): qty 1

## 2017-01-15 MED ORDER — TRAZODONE HCL 50 MG PO TABS
50.0000 mg | ORAL_TABLET | Freq: Every evening | ORAL | Status: DC | PRN
  Administered 2017-01-18: 50 mg via ORAL
  Filled 2017-01-15: qty 7
  Filled 2017-01-15: qty 1

## 2017-01-15 MED ORDER — TAB-A-VITE/IRON PO TABS
1.0000 | ORAL_TABLET | Freq: Every day | ORAL | Status: DC
  Administered 2017-01-15 – 2017-01-18 (×4): 1 via ORAL
  Filled 2017-01-15: qty 1
  Filled 2017-01-15: qty 7
  Filled 2017-01-15 (×4): qty 1

## 2017-01-15 MED ORDER — GLUCERNA SHAKE PO LIQD
237.0000 mL | Freq: Three times a day (TID) | ORAL | Status: DC
  Administered 2017-01-15 – 2017-01-18 (×9): 237 mL via ORAL

## 2017-01-15 MED ORDER — INSULIN ASPART PROT & ASPART (70-30 MIX) 100 UNIT/ML ~~LOC~~ SUSP
10.0000 [IU] | Freq: Two times a day (BID) | SUBCUTANEOUS | Status: DC
  Administered 2017-01-15 – 2017-01-18 (×5): 10 [IU] via SUBCUTANEOUS
  Filled 2017-01-15: qty 0.1

## 2017-01-15 MED ORDER — BACITRACIN-NEOMYCIN-POLYMYXIN 400-5-5000 EX OINT
TOPICAL_OINTMENT | Freq: Every day | CUTANEOUS | Status: DC
  Administered 2017-01-16 – 2017-01-18 (×2): via TOPICAL
  Filled 2017-01-15: qty 1

## 2017-01-15 NOTE — H&P (Signed)
Psychiatric Admission Assessment Adult  Patient Identification: James [name] MRN:  [id number] Date of Evaluation:  01/15/2017 Chief Complaint:  MDD COCAINE USE DISORDER; SEVERE Principal Diagnosis: <principal problem not specified> Diagnosis:   Patient Active Problem List   Diagnosis Date Noted  . MDD (major depressive disorder) [F32.9] 01/14/2017  . Cellulitis of left foot [L03.116] 09/28/2016  . Cellulitis [L03.90] 09/28/2016  . Cardiomyopathy- suspect NICM but etiology not yet determined [I42.9] 04/27/2016  . Acute combined systolic and diastolic heart failure ([place]) [I50.41] 04/27/2016  . History of major depression [Z86.59] 04/27/2016  . Community acquired pneumonia [J18.9] 04/26/2016  . Acute respiratory failure with hypoxia ([place]) [J96.01] 04/26/2016  . Controlled type 2 diabetes mellitus with hyperglycemia ([place]) [E11.65] 04/26/2016  . Uncontrolled type 2 diabetes mellitus with hyperglycemia ([place]) [E11.65] 04/26/2016  . 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 MDD (major depressive disorder), recurrent severe, without psychosis ([place]) [F33.2] 06/26/2015  . History of cocaine abuse [Z87.898] 06/25/2015  . 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 MUSCLE SPASM, BACK [M53.80] 02/12/2009  . 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 HYPERCHOLESTEROLEMIA [E78.00] 05/09/2007  . SINUSITIS, CHRONIC [J32.9] 05/01/2007  . ALLERGIC RHINITIS [J30.9] 12/21/2006  . Essential hypertension [I10] 12/07/2006   History of Present Illness: James [name] is a 56 y.o. male admitted emergently and voluntarily [facility] for increased symptoms of depression associated with relapse on drug of abuse which is cocaine and also reportedly having suicide ideations with plan of overdose on medications. Reportedly he was sober for 8 months and doing well and become relapsed about July 2018 and not able to care for himself, family does not  like to engage with him and developed a abscess on his left fore arm and believe it is due to spider bite which later infected. Patient denied out patient counseling or support groups. He doesn't even know how the relapse happened and was in disbelief that it did happen again. He hs been remorseful for the effect his drug use, has on his family and reports having SI for a week. He reports passive SI, such as "I wish God would just kill me", but when asked about associated suicidal plans, pt says that he had thought about ODing on his meds or cutting himself with a box cutter he has. Patient also stated that he does not want kill himself any more and he has three grown up children and several grandchildren. He denies HI or AVH. Patient also stated that he was taken wellbutrin during last admission which helped with his mood and no side effects. He is consenting for the same medication. Case discussed with staff RN regarding wound care and possible neosporin for local antibiotic treatment as needed.   Associated Signs/Symptoms: Depression Symptoms:  depressed mood, anhedonia, psychomotor retardation, fatigue, feelings of worthlessness/guilt, difficulty concentrating, hopelessness, recurrent thoughts of death, anxiety, weight loss, decreased appetite, (Hypo) Manic Symptoms:  Distractibility, Impulsivity, Irritable Mood, Anxiety Symptoms:  Excessive Worry, Psychotic Symptoms:  Delusions, Paranoia, PTSD Symptoms: Negative Total Time spent with patient: 1 hour  Past Psychiatric History: He has one previous psychiatric admission about two years ago for substance induced depression and suicide ideations.  Is the patient at risk to self? Yes.    Has the patient been a risk to self in the past 6 months? Yes.    Has the patient been a risk to self within the distant past? Yes.    Is the patient a risk  to others? No.  Has the patient been a risk to others in the past 6 months? No.  Has the patient  been a risk to others within the distant past? No.   Prior Inpatient Therapy:   Prior Outpatient Therapy:    Alcohol Screening: 1. How often do you have a drink containing alcohol?: 2 to 4 times a month 2. How many drinks containing alcohol do you have on a typical day when you are drinking?: 1 or 2 3. How often do you have six or more drinks on one occasion?: Never Preliminary Score: 0 9. Have you or someone else been injured as a result of your drinking?: No 10. Has a relative or friend or a doctor or another health worker been concerned about your drinking or suggested you cut down?: No Alcohol Use Disorder Identification Test Final Score (AUDIT): 2 Brief Intervention: AUDIT score less than 7 or less-screening does not suggest unhealthy drinking-brief intervention not indicated Substance Abuse History in the last 12 months:  Yes.   Consequences of Substance Abuse: Medical Consequences:  Unable to care for himself and hand wound. Legal Consequences:  probation for drugs until next month Family Consequences:  none reported Previous Psychotropic Medications: Yes  Psychological Evaluations: Yes  Past Medical History:  Past Medical History:  Diagnosis Date  . Cellulitis and abscess of foot 09/29/2016  . Chronic systolic heart failure ([place])   . Depression   . Diabetes mellitus without complication ([place])   . Essential hypertension   . NICM (nonischemic cardiomyopathy) ([place]) EF 20-25%     Past Surgical History:  Procedure Laterality Date  . CARDIAC CATHETERIZATION N/A 04/30/2016   Procedure: Right/Left Heart Cath and Coronary Angiography;  Surgeon: Nelva Bush, MD;  Location: [facility];  Service: Cardiovascular;  Laterality: N/A;  . FINGER SURGERY     Family History:  Family History  Problem Relation Age of Onset  . Diabetes Other   . Hypertension Other   . Diabetes Mother   . Brain cancer Father   . Mental illness Brother   . Drug abuse Brother   . Diabetes  Brother    Family Psychiatric  History: None reported. Married and has 3 grown children and works for [employer] off and on. Tobacco Screening: Have you used any form of tobacco in the last 30 days? (Cigarettes, Smokeless Tobacco, Cigars, and/or Pipes): Yes Tobacco use, Select all that apply: 5 or more cigarettes per day Are you interested in Tobacco Cessation Medications?: No, patient refused Counseled patient on smoking cessation including recognizing danger situations, developing coping skills and basic information about quitting provided: Refused/Declined practical counseling Social History:  History  Alcohol Use No    Comment: none as of 04/26/16     History  Drug Use No    Comment: none as of 02/15/2016    Additional Social History: Lives with his wife, wife works and does not want to deal with him.                          Allergies:  No Known Allergies Lab Results:  Results for orders placed or performed during the hospital encounter of 01/14/17 (from the past 48 hour(s))  Glucose, capillary     Status: Abnormal   Collection Time: 01/14/17  5:02 PM  Result Value Ref Range   Glucose-Capillary 294 (H) 65 - 99 mg/dL  Glucose, capillary     Status: Abnormal   Collection  Time: 01/14/17  9:27 PM  Result Value Ref Range   Glucose-Capillary 141 (H) 65 - 99 mg/dL   Comment 1 Notify RN    Comment 2 Document in Chart   Glucose, capillary     Status: Abnormal   Collection Time: 01/15/17  6:36 AM  Result Value Ref Range   Glucose-Capillary 189 (H) 65 - 99 mg/dL   Comment 1 Notify RN     Blood Alcohol level:  Lab Results  Component Value Date   ETH <10 01/13/2017   ETH <5 81/82/9937    Metabolic Disorder Labs:  Lab Results  Component Value Date   HGBA1C 13.1 (H) 09/29/2016   MPG 329 09/29/2016   MPG 332 04/27/2016   No results found for: PROLACTIN Lab Results  Component Value Date   CHOL 146 04/28/2016   TRIG 71 04/28/2016   HDL 61 04/28/2016   CHOLHDL  2.4 04/28/2016   VLDL 14 04/28/2016   LDLCALC 71 04/28/2016   LDLCALC 90 08/16/2008    Current Medications: Current Facility-Administered Medications  Medication Dose Route Frequency Provider Last Rate Last Dose  . acetaminophen (TYLENOL) tablet 650 mg  650 mg Oral Q4H PRN Rankin, Shuvon B, NP   650 mg at 01/14/17 2201  . alum & mag hydroxide-simeth (MAALOX/MYLANTA) 200-200-20 MG/5ML suspension 30 mL  30 mL Oral Q4H PRN Rankin, Shuvon B, NP      . atorvastatin (LIPITOR) tablet 40 mg  40 mg Oral Daily Rankin, Shuvon B, NP   40 mg at 01/15/17 0908  . buPROPion (WELLBUTRIN XL) 24 hr tablet 150 mg  150 mg Oral Daily Ambrose Finland, MD      . carvedilol (COREG) tablet 25 mg  25 mg Oral BID WC Rankin, Shuvon B, NP   25 mg at 01/15/17 0908  . furosemide (LASIX) tablet 20 mg  20 mg Oral Daily Rankin, Shuvon B, NP   20 mg at 01/15/17 0908  . insulin aspart protamine- aspart (NOVOLOG MIX 70/30) injection 10 Units  10 Units Subcutaneous BID WC Lewis, Tanika N, NP      . insulin glargine (LANTUS) injection 15 Units  15 Units Subcutaneous QHS Rankin, Shuvon B, NP   15 Units at 01/14/17 2201  . magnesium hydroxide (MILK OF MAGNESIA) suspension 30 mL  30 mL Oral Daily PRN Rankin, Shuvon B, NP      . metFORMIN (GLUCOPHAGE) tablet 1,000 mg  1,000 mg Oral BID WC Rankin, Shuvon B, NP   1,000 mg at 01/15/17 0908  . multivitamins with iron tablet 1 tablet  1 tablet Oral Daily Ambrose Finland, MD      . spironolactone (ALDACTONE) tablet 25 mg  25 mg Oral Daily Rankin, Shuvon B, NP   25 mg at 01/15/17 0907  . traZODone (DESYREL) tablet 50 mg  50 mg Oral QHS PRN Ambrose Finland, MD       PTA Medications: Prescriptions Prior to Admission  Medication Sig Dispense Refill Last Dose  . atorvastatin (LIPITOR) 40 MG tablet Take 40 mg by mouth daily.   Past Month at Unknown time  . blood glucose meter kit and supplies Dispense based on patient and insurance preference. Use up to four times  daily as directed. (FOR ICD-9 250.00, 250.01). 1 each 0 Taking  . carvedilol (COREG) 25 MG tablet Take 1 tablet (25 mg total) by mouth 2 (two) times daily with a meal. 180 tablet 3 Past Month at Unknown time  . furosemide (LASIX) 20 MG tablet Take 1  tablet (20 mg total) by mouth daily. 90 tablet 3 Past Month at Unknown time  . insulin aspart protamine- aspart (NOVOLOG MIX 70/30) (70-30) 100 UNIT/ML injection Inject 0.1-0.15 mLs (10-15 Units total) into the skin 2 (two) times daily with a meal. Sliding scale 10 mL 11 Past Month at Unknown time  . insulin glargine (LANTUS) 100 UNIT/ML injection Inject 0.15 mLs (15 Units total) into the skin at bedtime. Sliding scale 10 mL 1 Past Month at Unknown time  . Insulin Syringe-Needle U-100 (INSULIN SYRINGE .3CC/31GX5/16") 31G X 5/16" 0.3 ML MISC Use with lantus, once daily 30 each 0 Taking  . lisinopril (PRINIVIL,ZESTRIL) 20 MG tablet Take 1 tablet (20 mg total) by mouth daily. 90 tablet 3 Past Month at Unknown time  . metFORMIN (GLUCOPHAGE) 1000 MG tablet Take 1 tablet (1,000 mg total) by mouth 2 (two) times daily with a meal. 60 tablet 0 Past Month at Unknown time  . spironolactone (ALDACTONE) 25 MG tablet Take 1 tablet (25 mg total) by mouth daily. 90 tablet 3 Past Month at Unknown time    Musculoskeletal: Strength & Muscle Tone: within normal limits Gait & Station: normal Patient leans: N/A  Psychiatric Specialty Exam: Physical Exam as per history and physical  ROS wound on his left hand and depression and cocaine abuse No Fever-chills, No Headache, No changes with Vision or hearing, reports vertigo No problems swallowing food or Liquids, No Chest pain, Cough or Shortness of Breath, No Abdominal pain, No Nausea or Vommitting, Bowel movements are regular, No Blood in stool or Urine, No dysuria, No new skin rashes or bruises, No new joints pains-aches,  No new weakness, tingling, numbness in any extremity, No recent weight gain or loss, No  polyuria, polydypsia or polyphagia,   A full 10 point Review of Systems was done, except as stated above, all other Review of Systems were negative.  Blood pressure 109/67, pulse 92, temperature 98.4 F (36.9 C), resp. rate 18, height 6' (1.829 m), weight 61.2 kg (135 lb), SpO2 100 %.Body mass index is 18.31 kg/m.  General Appearance: Disheveled and Guarded  Eye Contact:  Good  Speech:  Clear and Coherent  Volume:  Normal  Mood:  Anxious, Depressed, Hopeless and Worthless  Affect:  Constricted and Depressed  Thought Process:  Coherent and Goal Directed  Orientation:  Full (Time, Place, and Person)  Thought Content:  Paranoid Ideation and Rumination  Suicidal Thoughts:  Yes.  with intent/plan  Homicidal Thoughts:  No  Memory:  Immediate;   Good Recent;   Fair Remote;   Fair  Judgement:  Fair  Insight:  Fair  Psychomotor Activity:  Decreased  Concentration:  Concentration: Good and Attention Span: Fair  Recall:  Good  Fund of Knowledge:  Good  Language:  Good  Akathisia:  Negative  Handed:  Right  AIMS (if indicated):     Assets:  Communication Skills Desire for Improvement Housing Leisure Time Physical Health Resilience Social Support Talents/Skills Transportation  ADL's:  Intact  Cognition:  WNL  Sleep:       Treatment Plan Summary: Daily contact with patient to assess and evaluate symptoms and progress in treatment and Medication management  Observation Level/Precautions:  15 minute checks  Laboratory:  Reviewed admission labs.  Psychotherapy:  Group therapies  Medications:  Wellbutrin XR 150 mg daily and trazodone 50 mg Qhs PRN for insomnia, daily wound care, HTN, diabetic and cholesterol treatment as per PTA.  Consultations:  As needed  Discharge Concerns:  Safety  Estimated LOS: 3-5 days  Other:     Physician Treatment Plan for Primary Diagnosis: <principal problem not specified> Long Term Goal(s): Improvement in symptoms so as ready for  discharge  Short Term Goals: Ability to identify changes in lifestyle to reduce recurrence of condition will improve, Ability to verbalize feelings will improve, Ability to disclose and discuss suicidal ideas and Ability to demonstrate self-control will improve  Physician Treatment Plan for Secondary Diagnosis: Active Problems:   MDD (major depressive disorder)  Long Term Goal(s): Improvement in symptoms so as ready for discharge  Short Term Goals: Ability to identify and develop effective coping behaviors will improve, Ability to maintain clinical measurements within normal limits will improve, Compliance with prescribed medications will improve and Ability to identify triggers associated with substance abuse/mental health issues will improve  I certify that inpatient services furnished can reasonably be expected to improve the patient's condition.    Ambrose Finland, MD 10/6/201811:24 AM

## 2017-01-15 NOTE — [facility] Group Notes (Signed)
LCSW Group Therapy Note  01/15/2017     10:00-11:00AM  Type of Therapy and Topic:  Group Therapy:  Decisional Balance/Substance Use  Participation Level:  Did Not Attend        . Description of Group:  The main focus of today's process group was learning how to use a decisional balance exercise to make a decision about whether to change an unhealthy coping skill, as well as how to use the information gathered in the actual process of planning that change.  Patients listed some of their most frequently utilized unhealthy coping techniques and CSW pointed out the similarities.  Motivational Interviewing and the whiteboard were utilized to help patients explore in-depth the perceived benefits and costs of a specific, shared unhealthy coping technique (drinking & drugging) as well as the benefits and costs of replacing that with other, healthy coping skills.  A handout was distributed for patients to be able to do this exercise for themselves.     Therapeutic Goals 1. Patient will be able to utilize the decision balance exercise on their own 2. Patient will list coping skills they use to fulfill their needs 3. Patient will identify the differences between healthy and  unhealthy coping skills 4. Patient will verbalize the costs and benefits of drinking/drugging versus making the choice to change 5. Patient will learn how to use the exercise to identify the most important supports to put in place so that they can succeed in a change to which they commit  Summary of Patient Progress: N/A   Therapeutic Modalities Cognitive Behavioral Therapy Motivational Interviewing   Lynnell Chad, LCSW

## 2017-01-15 NOTE — [facility] Suicide Risk Assessment (Signed)
[facility] Admission Suicide Risk Assessment   Nursing information obtained from:  Patient Demographic factors:  Male Current Mental Status:  Suicidal ideation indicated by patient Loss Factors:  Decline in physical health, Financial problems / change in socioeconomic status, Legal issues Historical Factors:  Family history of mental illness or substance abuse Risk Reduction Factors:  Living with another person, especially a relative  Total Time spent with patient: 1 hour Principal Problem: <principal problem not specified> Diagnosis:   Patient Active Problem List   Diagnosis Date Noted  . MDD (major depressive disorder) [F32.9] 01/14/2017  . Cellulitis of left foot [L03.116] 09/28/2016  . Cellulitis [L03.90] 09/28/2016  . Cardiomyopathy- suspect NICM but etiology not yet determined [I42.9] 04/27/2016  . Acute combined systolic and diastolic heart failure (H[facility]) [I50.41] 04/27/2016  . History of major depression [Z86.59] 04/27/2016  . Community acquired pneumonia [J18.9] 04/26/2016  . Acute respiratory failure with hypoxia ([facility]) [J96.01] 04/26/2016  . Controlled type 2 diabetes mellitus with hyperglycemia ([facility]) [E11.65] 04/26/2016  . Uncontrolled type 2 diabetes mellitus with hyperglycemia ([facility]) [E11.65] 04/26/2016  . 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 MDD (major depressive disorder), recurrent severe, without psychosis ([facility]) [F33.2] 06/26/2015  . History of cocaine abuse [Z87.898] 06/25/2015  . 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 MUSCLE SPASM, BACK [M53.80] 02/12/2009  . 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 HYPERCHOLESTEROLEMIA [E78.00] 05/09/2007  . SINUSITIS, CHRONIC [J32.9] 05/01/2007  . ALLERGIC RHINITIS [J30.9] 12/21/2006  . Essential hypertension [I10] 12/07/2006   Subjective Data: James [name] is a 56 y.o. male who pesents voluntarily to ED due to SI associated with a recent relapse on  cocaine. Pt reports that he was sober for 8 months and doing well. Pt continues that he became comfortable and felt that he could pull back on his meetings and other efforts to stay clean. Pt reports that he doesn't even know how the relapse happened and was in disbelief that it did happen. Pt expresses remorse for the effect his drug use has on his family and reports having SI for @ a week. Pt initially reports passive SI, such as "I wish God would just kill me", but when asked about associated suicidal plans, pt says that he had thought about ODing on his meds or cutting himself with a box cutter he has. Pt is not taking any psych meds currently. He denies HI or AVH.    Continued Clinical Symptoms:  Alcohol Use Disorder Identification Test Final Score (AUDIT): 2 The "Alcohol Use Disorders Identification Test", Guidelines for Use in Primary Care, Second Edition.  World Science writer [facility]). Score between 0-7:  no or low risk or alcohol related problems. Score between 8-15:  moderate risk of alcohol related problems. Score between 16-19:  high risk of alcohol related problems. Score 20 or above:  warrants further diagnostic evaluation for alcohol dependence and treatment.   CLINICAL FACTORS:   Severe Anxiety and/or Agitation Depression:   Aggression Anhedonia Hopelessness Impulsivity Insomnia Recent sense of peace/wellbeing Severe Alcohol/Substance Abuse/Dependencies More than one psychiatric diagnosis Unstable or Poor Therapeutic Relationship Previous Psychiatric Diagnoses and Treatments Medical Diagnoses and Treatments/Surgeries    Psychiatric Specialty Exam: Physical Exam   ROS  Blood pressure 109/67, pulse 92, temperature 98.4 F (36.9 C), resp. rate 18, height 6' (1.829 m), weight 61.2 kg (135 lb), SpO2 100 %.Body mass index is 18.31 kg/m.     COGNITIVE FEATURES THAT CONTRIBUTE TO RISK:  Closed-mindedness, Loss  of executive function, Polarized thinking and Thought  constriction (tunnel vision)    SUICIDE RISK:   Moderate:  Frequent suicidal ideation with limited intensity, and duration, some specificity in terms of plans, no associated intent, good self-control, limited dysphoria/symptomatology, some risk factors present, and identifiable protective factors, including available and accessible social support.  PLAN OF CARE: Admit for increased symptoms of depression and suicide ideation with intentional drug overdose and history of substance abuse.   I certify that inpatient services furnished can reasonably be expected to improve the patient's condition.   Leata Mouse, MD 01/15/2017, 11:33 AM

## 2017-01-15 NOTE — Progress Notes (Signed)
Pt at the time of assessment was flat and withdrawn to his room-remained in room with eyes closed. Pt at the time endorsed moderate depression and severe L. arm pain. Pt denied SI. Pt refused to participate. Pt was med compliant. All patient's questions and concerns addressed. 15-minute safety checks continue. Safety checks continue.

## 2017-01-15 NOTE — [facility] Suicide Risk Assessment (Signed)
[facility] INPATIENT:  Family/Significant Other Suicide Prevention Education  Suicide Prevention Education:  Patient Refusal for Family/Significant Other Suicide Prevention Education: The patient JEANPHILIPPE KYLE has refused to provide written consent for family/significant other to be provided Family/Significant Other Suicide Prevention Education during admission and/or prior to discharge.  Physician notified.  Brochure given to pt and reviewed briefly.  Carloyn Jaeger Grossman-Orr 01/15/2017, 3:43 PM

## 2017-01-15 NOTE — B[facility] Counselor (Signed)
Adult Comprehensive Assessment  Patient ID: James [name], male   DOB: Aug 15, 1960, 56 y.o.   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  Wife thinks this will never stop. On verge of losing her.   He does not see his grandchildren because of his addition.  "Everybody suffers because of my mistakes." Financial / Lack of resources (include bankruptcy): Financial stressors are significant, only wife is working, huge impact on finances from his substance abuse Housing / Lack of housing: Lives with his wife Physical health (include injuries & life threatening diseases): Diabetes which is unmanaged, very bad.  Still problems with hernia.  Open wound on arm currently, very painful. Substance abuse: Used crack cocaine daily since July 2018, had been sober 8 months prior to that (02/20/16 to 10/22/16) Bereavement / Loss: Father died approximately 6 years ago, brother many years ago.  Living/Environment/Situation:  Living Arrangements: Spouse/significant other Living conditions (as described by patient or guardian):patient currenlty lives with his wife in [address]  How long has patient lived in current situation?: 18 years, adult daughter just moved out 3 weeks ago What is atmosphere in current home:   Chaotic due to his substance abuse  Family History:  Marital status: Married Number of Years Married: 36 years  What types of issues is patient dealing with in the relationship?: Per patient, his drug use is the major struggle in his relationship Additional relationship information: none Does patient have children?: Yes How many children?:  3  How is patient's relationship with their children?: very close, however relationship now strained due to his drug use  Childhood History:  By whom was/is the patient raised?: Both parents Description of patient's relationship with caregiver when they were a child: very loving, caring and supportive. Per patient, father kept the family life peaceful Patient's description of current relationship with people who raised him/her: patient's father died 5 years ago, mother resides in [place] and has very limited contact with her Does patient have siblings?: Yes Number of Siblings: 5  Description of patient's current relationship with siblings: not as close as they reportedly hold grudges against patient for his past behaviors Did patient suffer any verbal/emotional/physical/sexual abuse as a child?: No Did patient suffer from severe childhood neglect?: No Has patient ever been sexually abused/assaulted/raped as an adolescent or adult?: No Was the patient ever a victim of a crime or a disaster?: No Witnessed domestic violence?: No Has patient been effected by domestic violence as an adult?: No  Education:  Highest grade of school patient has completed: 12 Currently a student?: No Name of school: NA Contact person: NA Learning disability?: No  Employment/Work Situation:  Employment situation: Unemployed Patient's job has been impacted by current illness: no What is the longest time patient has a held a job?: 5 years 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  No insurance Does patient have a representative payee or guardian?: No  Alcohol/Substance Abuse:  What has been your use of drugs/alcohol within the last 12 months?: Crack cocaine daily  since July 2018. If attempted suicide, did drugs/alcohol play a role in this?: No Alcohol/Substance Abuse Treatment Hx: Past Tx, Inpatient, Outpatient If yes, describe treatment: [facility] 7 yrs ago, [facility] program in [place] several years ago, [facility] detox[facility] in 2014, [place] [facility] 2017[facility] Has alcohol/substance abuse ever caused legal problems?: Yes- marijuana and cocaine possession charges   Social Support System:  [employer] Support System: Poor Describe Community Support System: Wife, self Type of faith/religion: [employer] Kn[employer] How does patient's faith help to cope with current illness?: Going to church is very helpful.  Leisure/Recreation:  Leisure and Hobbies: fishing  Strengths/Needs:  What things does the patient do well?: "I am a decent person" In what areas does patient struggle / problems for patient: drug addiction, self-esteem and guilt  Discharge Plan:  Does patient have access to transportation?: No (Needs help figuring this out) Will patient be returning to same living situation after discharge?: Yes Currently receiving community mental health services: Yes ([facility] [place], last seen 2 weeks ago - only seeing a therapist individually and group) If no, would patient like referral for services when discharged?: Yes (What county?) ([place], no insurance) Does patient have financial barriers related to dischargedications?: Yes Patient description of barriers related to discharge medications: No health insurance, no income   Summary/Recommendations:   Emergency planning/management officer and Recommendations (to be completed by the evaluator):  Patient is a 56yo male admitted with suicidal ideation associated with a recent relapse on crack cocaine after an 86-month period of sobriety.  Primary stressors include the negative effect of his addiction on his family relationships and marriage, not being able to stay sober, unemployment, and  uncontrolled medical issues.  Patient will benefit from crisis stabilization, medication evaluation, group therapy and psychoeducation, in addition to case management for discharge planning. At discharge it is recommended that Patient adhere to the established discharge plan and continue in treatment.  [name], LCSW 01/15/2017, 3:42 PM

## 2017-01-16 DIAGNOSIS — Z87891 Personal history of nicotine dependence: Secondary | ICD-10-CM

## 2017-01-16 DIAGNOSIS — F1910 Other psychoactive substance abuse, uncomplicated: Secondary | ICD-10-CM

## 2017-01-16 DIAGNOSIS — G4700 Insomnia, unspecified: Secondary | ICD-10-CM

## 2017-01-16 DIAGNOSIS — E119 Type 2 diabetes mellitus without complications: Secondary | ICD-10-CM

## 2017-01-16 LAB — GLUCOSE, CAPILLARY
GLUCOSE-CAPILLARY: 154 mg/dL — AB (ref 65–99)
GLUCOSE-CAPILLARY: 225 mg/dL — AB (ref 65–99)
Glucose-Capillary: 177 mg/dL — ABNORMAL HIGH (ref 65–99)

## 2017-01-16 LAB — TSH: TSH: 2.155 u[IU]/mL (ref 0.350–4.500)

## 2017-01-16 LAB — VITAMIN B12: VITAMIN B 12: 406 pg/mL (ref 180–914)

## 2017-01-16 LAB — LIPID PANEL
CHOLESTEROL: 147 mg/dL (ref 0–200)
HDL: 72 mg/dL (ref >40)
LDL Cholesterol: 66 mg/dL (ref 0–99)
TRIGLYCERIDES: 47 mg/dL (ref <150)
Total CHOL/HDL Ratio: 2 RATIO
VLDL: 9 mg/dL (ref 0–40)

## 2017-01-16 MED ORDER — INSULIN ASPART 100 UNIT/ML ~~LOC~~ SOLN
0.0000 [IU] | Freq: Three times a day (TID) | SUBCUTANEOUS | Status: DC
  Administered 2017-01-16: 3 [IU] via SUBCUTANEOUS
  Administered 2017-01-17: 5 [IU] via SUBCUTANEOUS
  Administered 2017-01-17: 9 [IU] via SUBCUTANEOUS
  Administered 2017-01-18: 3 [IU] via SUBCUTANEOUS
  Administered 2017-01-18: 2 [IU] via SUBCUTANEOUS

## 2017-01-16 MED ORDER — CLINDAMYCIN HCL 300 MG PO CAPS
300.0000 mg | ORAL_CAPSULE | Freq: Four times a day (QID) | ORAL | Status: DC
  Administered 2017-01-16 – 2017-01-18 (×9): 300 mg via ORAL
  Filled 2017-01-16: qty 1
  Filled 2017-01-16: qty 36
  Filled 2017-01-16: qty 2
  Filled 2017-01-16: qty 36
  Filled 2017-01-16 (×7): qty 1
  Filled 2017-01-16: qty 36
  Filled 2017-01-16 (×3): qty 1
  Filled 2017-01-16: qty 2
  Filled 2017-01-16: qty 36
  Filled 2017-01-16: qty 1

## 2017-01-16 NOTE — Progress Notes (Signed)
Patient ID: James [name], male   DOB: November 30, 1960, 56 y.o.   MRN: [id number]   D: Assumed care patient @ 2330. Patient in bed sleeping. Respiration regular and unlabored. No sign of distress noted at this time A: 15 mins checks for safety. R: Patient remains safe.

## 2017-01-16 NOTE — Progress Notes (Addendum)
[facility] MD Progress Note  01/16/2017 10:33 AM James [name]  MRN:  [id number] Subjective:  Patient states he is feeling " a little better". At this time denies any suicidal ideations, and is future oriented, stating he is hoping for discharge before next Tuesday as has a court date to attend . Denies hallucinations. He worries about medical issues, and states he realizes that his DM is currently poorly controlled. He also reports recent lesion on forearm, which he feels might have been an insect bite, progressing and ulcerating . Denies fever or chills or associated systemic symptoms. Currently denies medication side effects.  Objective: I have reviewed chart notes and have met with patient. 56 year old male, who presented for worsening depression. He had relapsed on cocaine  in July following several months of sobriety.  Reports concerns about physical health, in particular regarding DM, skin infection. States " I have lost weight too, my wife worries, and asked me whether I may have HIV". At this time presents calm, pleasant and cooperative on approach, describes partially improved mood compared to how he felt prior to admission. Denies SI, contracts for safety, and does not endorse psychotic symptoms. As per staff/RN notes, has presented with depressed, flat affect,has denied suicidal ideations. No psychotic symptoms. He denies medication side effects.  TSH 2.155  Principal Problem:  Depression, Substance Abuse  Diagnosis:   Patient Active Problem List   Diagnosis Date Noted  . MDD (major depressive disorder) [F32.9] 01/14/2017  . Cellulitis of left foot [L03.116] 09/28/2016  . Cellulitis [L03.90] 09/28/2016  . Cardiomyopathy- suspect NICM but etiology not yet determined [I42.9] 04/27/2016  . Acute combined systolic and diastolic heart failure ([place]) [I50.41] 04/27/2016  . History of major depression [Z86.59] 04/27/2016  . Community acquired pneumonia [J18.9] 04/26/2016  . Acute respiratory  failure with hypoxia ([place]) [J96.01] 04/26/2016  . Controlled type 2 diabetes mellitus with hyperglycemia ([place]) [E11.65] 04/26/2016  . Uncontrolled type 2 diabetes mellitus with hyperglycemia ([place]) [E11.65] 04/26/2016  . 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 MDD (major depressive disorder), recurrent severe, without psychosis ([place]) [F33.2] 06/26/2015  . History of cocaine abuse [Z87.898] 06/25/2015  . 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 MUSCLE SPASM, BACK [M53.80] 02/12/2009  . 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 HYPERCHOLESTEROLEMIA [E78.00] 05/09/2007  . SINUSITIS, CHRONIC [J32.9] 05/01/2007  . ALLERGIC RHINITIS [J30.9] 12/21/2006  . Essential hypertension [I10] 12/07/2006   Total Time spent with patient: 20 minutes   Past Medical History:  Past Medical History:  Diagnosis Date  . Cellulitis and abscess of foot 09/29/2016  . Chronic systolic heart failure ([place])   . Depression   . Diabetes mellitus without complication ([place])   . Essential hypertension   . NICM (nonischemic cardiomyopathy) ([place]) EF 20-25%     Past Surgical History:  Procedure Laterality Date  . CARDIAC CATHETERIZATION N/A 04/30/2016   Procedure: Right/Left Heart Cath and Coronary Angiography;  Surgeon: Nelva Bush, MD;  Location: [place] [facility];  Service: Cardiovascular;  Laterality: N/A;  . FINGER SURGERY     Family History:  Family History  Problem Relation Age of Onset  . Diabetes Other   . Hypertension Other   . Diabetes Mother   . Brain cancer Father   . Mental illness Brother   . Drug abuse Brother   . Diabetes Brother    Social History:  History  Alcohol Use No    Comment: none as of  04/26/16     History  Drug Use No    Comment: none as of 02/15/2016    Social History   Social History  . Marital status: Married    Spouse name: N/A  . Number of children: N/A  . Years of education: N/A    Social History Main Topics  . Smoking status: Former Smoker    Packs/day: 0.50    Years: 23.00    Types: Cigarettes    Quit date: 04/26/2016  . Smokeless tobacco: Never Used  . Alcohol use No     Comment: none as of 04/26/16  . Drug use: No     Comment: none as of 02/15/2016  . Sexual activity: Not Currently   Other Topics Concern  . None   Social History Narrative  . None   Additional Social History:   Sleep: improving  Appetite:  Fair  Current Medications: Current Facility-Administered Medications  Medication Dose Route Frequency Provider Last Rate Last Dose  . acetaminophen (TYLENOL) tablet 650 mg  650 mg Oral Q4H PRN Rankin, Shuvon B, NP   650 mg at 01/16/17 0830  . alum & mag hydroxide-simeth (MAALOX/MYLANTA) 200-200-20 MG/5ML suspension 30 mL  30 mL Oral Q4H PRN Rankin, Shuvon B, NP      . atorvastatin (LIPITOR) tablet 40 mg  40 mg Oral Daily Rankin, Shuvon B, NP   40 mg at 01/16/17 0829  . buPROPion (WELLBUTRIN XL) 24 hr tablet 150 mg  150 mg Oral Daily Ambrose Finland, MD   150 mg at 01/16/17 0828  . carvedilol (COREG) tablet 25 mg  25 mg Oral BID WC Rankin, Shuvon B, NP   25 mg at 01/16/17 0828  . feeding supplement (GLUCERNA SHAKE) (GLUCERNA SHAKE) liquid 237 mL  237 mL Oral TID BM Ambrose Finland, MD   237 mL at 01/16/17 1005  . furosemide (LASIX) tablet 20 mg  20 mg Oral Daily Rankin, Shuvon B, NP   20 mg at 01/16/17 0829  . insulin aspart protamine- aspart (NOVOLOG MIX 70/30) injection 10 Units  10 Units Subcutaneous BID WC Derrill Center, NP   10 Units at 01/16/17 [phone]  . insulin glargine (LANTUS) injection 15 Units  15 Units Subcutaneous QHS Rankin, Shuvon B, NP   15 Units at 01/15/17 2158  . magnesium hydroxide (MILK OF MAGNESIA) suspension 30 mL  30 mL Oral Daily PRN Rankin, Shuvon B, NP      . metFORMIN (GLUCOPHAGE) tablet 1,000 mg  1,000 mg Oral BID WC Rankin, Shuvon B, NP   1,000 mg at 01/16/17 0827  . multivitamins with iron tablet 1  tablet  1 tablet Oral Daily Ambrose Finland, MD   1 tablet at 01/16/17 0830  . neomycin-bacitracin-polymyxin (NEOSPORIN) ointment   Topical Daily Ambrose Finland, MD      . spironolactone (ALDACTONE) tablet 25 mg  25 mg Oral Daily Rankin, Shuvon B, NP   25 mg at 01/16/17 0830  . traZODone (DESYREL) tablet 50 mg  50 mg Oral QHS PRN Ambrose Finland, MD        Lab Results:  Results for orders placed or performed during the hospital encounter of 01/14/17 (from the past 48 hour(s))  Glucose, capillary     Status: Abnormal   Collection Time: 01/14/17  5:02 PM  Result Value Ref Range   Glucose-Capillary 294 (H) 65 - 99 mg/dL  Glucose, capillary     Status: Abnormal   Collection Time: 01/14/17  9:27 PM  Result Value Ref  Range   Glucose-Capillary 141 (H) 65 - 99 mg/dL   Comment 1 Notify RN    Comment 2 Document in Chart   Glucose, capillary     Status: Abnormal   Collection Time: 01/15/17  6:36 AM  Result Value Ref Range   Glucose-Capillary 189 (H) 65 - 99 mg/dL   Comment 1 Notify RN   Glucose, capillary     Status: Abnormal   Collection Time: 01/15/17 11:54 AM  Result Value Ref Range   Glucose-Capillary 273 (H) 65 - 99 mg/dL  Glucose, capillary     Status: Abnormal   Collection Time: 01/15/17  9:34 PM  Result Value Ref Range   Glucose-Capillary 244 (H) 65 - 99 mg/dL  Glucose, capillary     Status: Abnormal   Collection Time: 01/16/17  6:09 AM  Result Value Ref Range   Glucose-Capillary 154 (H) 65 - 99 mg/dL  TSH     Status: None   Collection Time: 01/16/17  6:22 AM  Result Value Ref Range   TSH 2.155 0.350 - 4.500 uIU/mL    Comment: Performed by a 3rd Generation assay with a functional sensitivity of <=0.01 uIU/mL. Performed at [facility], [place] [address] [place], [place] [address]     Blood Alcohol level:  Lab Results  Component Value Date   [facility] <10 01/13/2017   ETH <5 09/38/1829    Metabolic Disorder Labs: Lab Results   Component Value Date   HGBA1C 13.1 (H) 09/29/2016   MPG 329 09/29/2016   MPG 332 04/27/2016   No results found for: PROLACTIN Lab Results  Component Value Date   CHOL 146 04/28/2016   TRIG 71 04/28/2016   HDL 61 04/28/2016   CHOLHDL 2.4 04/28/2016   VLDL 14 04/28/2016   LDLCALC 71 04/28/2016   LDLCALC 90 08/16/2008    Physical Findings: AIMS: Facial and Oral Movements Muscles of Facial Expression: None, normal Lips and Perioral Area: None, normal Jaw: None, normal Tongue: None, normal,Extremity Movements Upper (arms, wrists, hands, fingers): None, normal Lower (legs, knees, ankles, toes): None, normal, Trunk Movements Neck, shoulders, hips: None, normal, Overall Severity Severity of abnormal movements (highest score from questions above): None, normal Incapacitation due to abnormal movements: None, normal Patient's awareness of abnormal movements (rate only patient's report): No Awareness, Dental Status Current problems with teeth and/or dentures?: No Does patient usually wear dentures?: No  CIWA:    COWS:     Musculoskeletal: Strength & Muscle Tone: within normal limits Gait & Station: normal Patient leans: N/A  Psychiatric Specialty Exam: Physical Exam  ROS denies chest pain, no shortness of breath, no vomiting, no increased pain on skin abscess area, no fever, no chills .   Blood pressure 106/63, pulse 85, temperature 97.6 F (36.4 C), temperature source Oral, resp. rate 12, height 6' (1.829 m), weight 61.2 kg (135 lb), SpO2 100 %.Body mass index is 18.31 kg/m.  General Appearance: Fairly Groomed  Eye Contact:  Good  Speech:  Normal Rate  Volume:  Normal  Mood:  depressed, but states feeling better compared to admission   Affect:  vaguely constricted, but reactive and does smile at times appropriately   Thought Process:  Linear and Descriptions of Associations: Intact  Orientation:  Other:  fully alert and attentive   Thought Content:  denies hallucinations,  no delusions, not internally preoccupied   Suicidal Thoughts:  No- at this time denies suicidal plan or intention, denies any self injurious ideations, contracts for safety on  unit, denies homicidal ideations   Homicidal Thoughts:  No  Memory:  recent and remote grossly intact   Judgement:  Other:  improving   Insight:  improving   Psychomotor Activity:  Normal  Concentration:  Concentration: Good and Attention Span: Good  Recall:  Good  Fund of Knowledge:  Good  Language:  Good  Akathisia:  Negative  Handed:  Right  AIMS (if indicated):     Assets:  Communication Skills Desire for Improvement Social Support  ADL's:  Intact  Cognition:  WNL  Sleep:      Assessment : Patient reports partially improved mood, and denies suicidal ideations at this time. Is future oriented, and focusing on discharging soon. He reports concerns about general decline in physical health, including poorly controlled DM, weight loss, and skin infection. ( No fever, no chills, WBC 5.1 ) . He worries about HIV status and requests testing. Of note, states Wellbutrin XL is helping and well tolerated . We discussed potential dangerous interactions between this antidepressant and cocaine,and discussed changing to another antidepressant type based on this. He declined, states he does not want to change Wellbutrin as it is helping, well tolerated, and states being on it will help him abstain from illicit drugs/cocaine .  Treatment Plan Summary: Daily contact with patient to assess and evaluate symptoms and progress in treatment, Medication management, Plan inpatient treatment  and medications as below  Encourage group and milieu participation to work on coping skills and symptom reduction Encourage ongoing efforts to work on sobriety and relapse prevention  Treatment team working on disposition planning options Continue Wellbutrin XL 150 mgrs QDAY for depression Continue Trazodone 50 mgrs QHS PRN for insomnia  Continue  DM management  Continue dressing changes to L forearm abscess . Will request hospitalist consultation to help with management of above  At patient's request will order HIV testing   Jenne Campus, MD 01/16/2017, 10:33 AM

## 2017-01-16 NOTE — Progress Notes (Signed)
D. Pt presents with a flat affect and withdrawn to room. Pt's dressing to left arm changed, and ointment applied. Abscess draining purulent, bloody discharge- approximately size of quarter. Pt c/o pain 7-8 at site of wound. Pt currently denies SI/HI and AVH and agrees to contact staff before acting on any harmful thoughts. Per pt's self inventory, pt rates his depression an 8, and hopelessness and anxiety both a 5. Pt also reports having a good appetite, and is sleeping well. Pt reports his goal today is to " stay clean", by "thinking positive". A. Labs and vitals monitored. Pt compliant with prn and scheduled medications. Pt supported emotionally and encouraged to express concerns and ask questions.   R. Pt remains safe with 15 minute checks. Will continue POC.

## 2017-01-16 NOTE — B[facility] Group Notes (Signed)
[facility] LCSW Group Therapy Note  Date/Time:  01/16/2017 10:00-11:00AM  Type of Therapy and Topic:  Group Therapy:  Healthy and Unhealthy Supports  Participation Level:  Did Not Attend   Description of Group:  Patients in this group were introduced to the idea of adding a variety of healthy supports to address the various needs in their lives. The picture on the front of Sunday's workbook was used to demonstrate why more supports are needed in every patient's life.  Patients identified and described healthy supports versus unhealthy supports in general, then gave examples of each in their own lives.   They discussed what additional healthy supports could be helpful in their recovery and wellness after discharge in order to prevent future hospitalizations.   An emphasis was placed on using counselor, doctor, therapy groups, 12-step groups, and problem-specific support groups to expand supports.  They also worked as a group on developing a specific plan for several patients to deal with unhealthy supports through boundary-setting, psychoeducation with loved ones, and even termination of relationships.   Therapeutic Goals:   1)  discuss importance of adding supports to stay well once out of the hospital  2)  compare healthy versus unhealthy supports and identify some examples of each  3)  generate ideas and descriptions of healthy supports that can be added  4)  offer mutual support about how to address unhealthy supports  5)  encourage active participation in and adherence to discharge plan    Summary of Patient Progress:  N/A   Therapeutic Modalities:   Motivational Interviewing Brief Solution-Focused Therapy  [name], LCSW 01/16/2017, 8:29 AM

## 2017-01-16 NOTE — Progress Notes (Signed)
NUTRITION ASSESSMENT  Pt identified as at risk on the Malnutrition Screen Tool  INTERVENTION: 1. Supplements: Continue Glucerna Shake po TID, each supplement provides 220 kcal and 10 grams of protein  NUTRITION DIAGNOSIS: Unintentional weight loss related to sub-optimal intake as evidenced by pt report.   Goal: Pt to meet >/= 90% of their estimated nutrition needs.  Monitor:  PO intake  Assessment:  Pt admitted with depression, SI and cocaine use. Pt is underweight given BMI. Pt reports poor appetite. Per chart review, pt has lost 30 lb since 7/9 (18% wt loss x 3 months, significant for time frame). Pt is receiving Glucerna shakes for elevated CBGs.  Height: Ht Readings from Last 1 Encounters:  01/14/17 6' (1.829 m)    Weight: Wt Readings from Last 1 Encounters:  01/14/17 135 lb (61.2 kg)    Weight Hx: Wt Readings from Last 10 Encounters:  01/14/17 135 lb (61.2 kg)  10/18/16 165 lb (74.8 kg)  09/29/16 171 lb 8.3 oz (77.8 kg)  07/09/16 164 lb (74.4 kg)  05/28/16 162 lb (73.5 kg)  05/14/16 157 lb 3.2 oz (71.3 kg)  05/02/16 148 lb 4.8 oz (67.3 kg)  12/11/15 136 lb (61.7 kg)  12/11/15 134 lb (60.8 kg)  12/05/15 134 lb (60.8 kg)    BMI:  Body mass index is 18.31 kg/m. Pt meets criteria for underweight based on current BMI.  Estimated Nutritional Needs: Kcal: 25-30 kcal/kg Protein: > 1 gram protein/kg Fluid: 1 ml/kcal  Diet Order: Diet regular Room service appropriate? Yes; Fluid consistency: Thin Pt is also offered choice of unit snacks mid-morning and mid-afternoon.  Pt is eating as desired.   Lab results and medications reviewed.   Tilda Franco, MS, RD, LDN Pager: [phone] After Hours Pager: [phone]

## 2017-01-16 NOTE — Progress Notes (Signed)
Nursing Progress Note: 7p-7a D: Pt currently presents with a pleasant on approach/sad/depressed affect and behavior. Pt states "I have had a better day today. My spider bite is really bothering me." Interacting appropriately with the milieu. Pt reports good sleep during the previous night with current medication regimen. Pt did attend wrap-up group.  A: Pt wound draining white puss and scant amount of blood. Wound open and raised with encircling cellulitis. Bandage and antibiotic ointment was applied at 2134. Pt provided with medications per providers orders. Pt's labs and vitals were monitored throughout the night. Pt supported emotionally and encouraged to express concerns and questions. Pt educated on medications.  R: Pt's safety ensured with 15 minute and environmental checks. Pt currently denies SI, HI, and AVH. Pt verbally contracts to seek staff if SI,HI, or AVH occurs and to consult with staff before acting on any harmful thoughts. Will continue to monitor.

## 2017-01-16 NOTE — [facility] Group Notes (Signed)
[facility] Group Notes:  (Nursing/MHT/Case Management/Adjunct)  Date:  01/16/2017  Time:  1400  Type of Therapy:  Nurse Education  Participation Level:  Active  Participation Quality:  Appropriate and Attentive  Affect:  Appropriate  Cognitive:  Alert and Appropriate  Insight:  Improving  Engagement in Group:  Engaged  Modes of Intervention:  Discussion and Guided Meditation  Summary of Progress/Problems:  Topic discussed on stress and stress management. Pt was appropriative and participated well with group topic.    Norm Parcel Orby Tangen 01/16/2017, 2:43 PM

## 2017-01-17 DIAGNOSIS — Z653 Problems related to other legal circumstances: Secondary | ICD-10-CM

## 2017-01-17 DIAGNOSIS — L02414 Cutaneous abscess of left upper limb: Secondary | ICD-10-CM

## 2017-01-17 LAB — GLUCOSE, CAPILLARY
GLUCOSE-CAPILLARY: 117 mg/dL — AB (ref 65–99)
GLUCOSE-CAPILLARY: 363 mg/dL — AB (ref 65–99)
GLUCOSE-CAPILLARY: 386 mg/dL — AB (ref 65–99)
Glucose-Capillary: 299 mg/dL — ABNORMAL HIGH (ref 65–99)

## 2017-01-17 LAB — PROLACTIN: PROLACTIN: 10.7 ng/mL (ref 4.0–15.2)

## 2017-01-17 MED ORDER — PREDNISONE 5 MG (21) PO TBPK: 5.0000 mg | ORAL_TABLET | ORAL | Status: DC

## 2017-01-17 MED ORDER — PREDNISONE 5 MG (21) PO TBPK: 5.0000 mg | ORAL_TABLET | Freq: Four times a day (QID) | ORAL | Status: DC

## 2017-01-17 MED ORDER — PREDNISONE 5 MG PO TABS
20.0000 mg | ORAL_TABLET | Freq: Every day | ORAL | Status: DC
  Filled 2017-01-17: qty 1

## 2017-01-17 MED ORDER — PREDNISONE 5 MG PO TABS
15.0000 mg | ORAL_TABLET | Freq: Every day | ORAL | Status: DC
  Filled 2017-01-17: qty 3

## 2017-01-17 MED ORDER — PREDNISONE 5 MG (21) PO TBPK: 5.0000 mg | ORAL_TABLET | Freq: Three times a day (TID) | ORAL | Status: DC

## 2017-01-17 MED ORDER — PREDNISONE 10 MG (21) PO TBPK: 20.0000 mg | ORAL_TABLET | Freq: Every evening | ORAL | Status: DC

## 2017-01-17 MED ORDER — PREDNISONE 10 MG PO TABS: 10.0000 mg | ORAL_TABLET | Freq: Every day | ORAL | Status: DC

## 2017-01-17 MED ORDER — PREDNISONE 10 MG PO TABS
30.0000 mg | ORAL_TABLET | Freq: Every day | ORAL | Status: AC
  Administered 2017-01-17: 30 mg via ORAL
  Filled 2017-01-17: qty 3

## 2017-01-17 MED ORDER — PREDNISONE 10 MG (21) PO TBPK: 10.0000 mg | ORAL_TABLET | ORAL | Status: DC

## 2017-01-17 MED ORDER — PREDNISONE 5 MG (21) PO TBPK: 10.0000 mg | ORAL_TABLET | Freq: Every morning | ORAL | Status: DC

## 2017-01-17 MED ORDER — PREDNISONE 10 MG (21) PO TBPK: 20.0000 mg | ORAL_TABLET | Freq: Every morning | ORAL | Status: DC

## 2017-01-17 MED ORDER — PREDNISONE 5 MG PO TABS: 5.0000 mg | ORAL_TABLET | Freq: Every day | ORAL | Status: DC

## 2017-01-17 MED ORDER — MUPIROCIN CALCIUM 2 % EX CREA
TOPICAL_CREAM | Freq: Every day | CUTANEOUS | Status: DC
  Administered 2017-01-17: 1 via TOPICAL
  Administered 2017-01-18: 08:00:00 via TOPICAL
  Filled 2017-01-17 (×2): qty 15

## 2017-01-17 MED ORDER — PREDNISONE 5 MG (21) PO TBPK: 10.0000 mg | ORAL_TABLET | Freq: Every evening | ORAL | Status: DC

## 2017-01-17 MED ORDER — PREDNISONE 10 MG (21) PO TBPK: 10.0000 mg | ORAL_TABLET | Freq: Three times a day (TID) | ORAL | Status: DC

## 2017-01-17 MED ORDER — PREDNISONE 5 MG PO TABS
25.0000 mg | ORAL_TABLET | Freq: Every day | ORAL | Status: AC
  Administered 2017-01-18: 25 mg via ORAL
  Filled 2017-01-17: qty 5

## 2017-01-17 MED ORDER — PREDNISONE 10 MG (21) PO TBPK: 10.0000 mg | ORAL_TABLET | Freq: Four times a day (QID) | ORAL | Status: DC

## 2017-01-17 MED ORDER — PREDNISONE 5 MG (21) PO TBPK
10.0000 mg | ORAL_TABLET | Freq: Every morning | ORAL | Status: DC
  Filled 2017-01-17: qty 21

## 2017-01-17 NOTE — [facility] Group Notes (Signed)
LCSW Group Therapy Note   01/17/2017 1:15pm   Type of Therapy and Topic:  Group Therapy:  Overcoming Obstacles   Participation Level: Active   Description of Group:    In this group patients will be encouraged to explore what they see as obstacles to their own wellness and recovery. They will be guided to discuss their thoughts, feelings, and behaviors related to these obstacles. The group will process together ways to cope with barriers, with attention given to specific choices patients can make. Each patient will be challenged to identify changes they are motivated to make in order to overcome their obstacles. This group will be process-oriented, with patients participating in exploration of their own experiences as well as giving and receiving support and challenge from other group members.   Therapeutic Goals: 1. Patient will identify personal and current obstacles as they relate to admission. 2. Patient will identify barriers that currently interfere with their wellness or overcoming obstacles.  3. Patient will identify feelings, thought process and behaviors related to these barriers. 4. Patient will identify two changes they are willing to make to overcome these obstacles:      Summary of Patient Progress Pt states that his biggest obstacle currently is a court date that he has tomorrow. Pt reports that he needs to discharge from the hospital so that he does not miss his court date. Pt's affect was appropriate and pt gave meaningful feedback to other members of the group.     Therapeutic Modalities:   Cognitive Behavioral Therapy Solution Focused Therapy Motivational Interviewing Relapse Prevention Therapy  Jonathon Jordan, MSW, [facility] 01/17/2017 3:54 PM

## 2017-01-17 NOTE — Progress Notes (Signed)
Recreation Therapy Notes  Date: 01/17/17 Time: 0930 Location: [address] Dayroom  Group Topic: Stress Management  Goal Area(s) Addresses:  Patient will verbalize importance of using healthy stress management.  Patient will identify positive emotions associated with healthy stress management.   Behavioral Response: Engaged  Intervention: Stress Management  Activity :  Meditation.  LRT introduced the stress management technique of meditation.  LRT played a meditation from the Calm app that explained stress and how we take on and exhibit stress throughout the body.  Patients were to listen and follow along as the meditation played to engage in the technique.  Education:  Stress Management, Discharge Planning.   Education Outcome: Acknowledges edcuation/In group clarification offered/Needs additional education  Clinical Observations/Feedback: Pt attended group.    Caroll Rancher, LRT/CTRS         Caroll Rancher A 01/17/2017 12:21 PM

## 2017-01-17 NOTE — Consult Note (Signed)
WOC Nurse wound consult note Reason for Consult: Nonhealing wound to left arm.  Unknown etiology.  No escahr, devitalized tissue noted to indicate a spider bite.  Will begin topical antimicrobial cream.  On PO Clindamycin as well.  Wound type: Trauma unknown etiology with comorbidity diabetes.  Pressure Injury POA: /NA Measurement: 4 cm x 2 cm x 0.2 cm  Wound RPR:XYVOP red Drainage (amount, consistency, odor) Serosanguinous  Musty odor Periwound:induration from 9 to 12 o'clock Dressing procedure/placement/frequency:Cleanse wound to left forearm with NS.  Apply mupirocin cream to wound.  Cover with nonadherent telfa and kerlix/tape.  Change daily.  Will not follow at this time.  Please re-consult if needed.  Maple Hudson RN BSN CWON Pager [phone]

## 2017-01-17 NOTE — Tx Team (Signed)
Interdisciplinary Treatment and Diagnostic Plan Update 01/17/2017 Time of Session: 9:30am  James [name]  MRN: [id number]  Principal Diagnosis: MDD COCAINE USE DISORDER; SEVERE  Secondary Diagnoses: Active Problems:   MDD (major depressive disorder)   Current Medications:  Current Facility-Administered Medications  Medication Dose Route Frequency Provider Last Rate Last Dose  . acetaminophen (TYLENOL) tablet 650 mg  650 mg Oral Q4H PRN Rankin, Shuvon B, NP   650 mg at 01/17/17 0015  . alum & mag hydroxide-simeth (MAALOX/MYLANTA) 200-200-20 MG/5ML suspension 30 mL  30 mL Oral Q4H PRN Rankin, Shuvon B, NP      . atorvastatin (LIPITOR) tablet 40 mg  40 mg Oral Daily Rankin, Shuvon B, NP   40 mg at 01/17/17 0807  . buPROPion (WELLBUTRIN XL) 24 hr tablet 150 mg  150 mg Oral Daily Ambrose Finland, MD   150 mg at 01/17/17 0807  . carvedilol (COREG) tablet 25 mg  25 mg Oral BID WC Rankin, Shuvon B, NP   25 mg at 01/17/17 0807  . clindamycin (CLEOCIN) capsule 300 mg  300 mg Oral Q6H Gennaro Africa, MD   300 mg at 01/17/17 3875  . feeding supplement (GLUCERNA SHAKE) (GLUCERNA SHAKE) liquid 237 mL  237 mL Oral TID BM Ambrose Finland, MD   237 mL at 01/16/17 2000  . furosemide (LASIX) tablet 20 mg  20 mg Oral Daily Rankin, Shuvon B, NP   20 mg at 01/17/17 0807  . insulin aspart (novoLOG) injection 0-9 Units  0-9 Units Subcutaneous TID WC Gennaro Africa, MD   3 Units at 01/16/17 1711  . insulin aspart protamine- aspart (NOVOLOG MIX 70/30) injection 10 Units  10 Units Subcutaneous BID WC Derrill Center, NP   10 Units at 01/16/17 1711  . insulin glargine (LANTUS) injection 15 Units  15 Units Subcutaneous QHS Rankin, Shuvon B, NP   15 Units at 01/16/17 2231  . magnesium hydroxide (MILK OF MAGNESIA) suspension 30 mL  30 mL Oral Daily PRN Rankin, Shuvon B, NP      . metFORMIN (GLUCOPHAGE) tablet 1,000 mg  1,000 mg Oral BID WC Rankin, Shuvon B, NP   1,000 mg at 01/17/17 0807  .  multivitamins with iron tablet 1 tablet  1 tablet Oral Daily Ambrose Finland, MD   1 tablet at 01/17/17 0807  . neomycin-bacitracin-polymyxin (NEOSPORIN) ointment   Topical Daily Ambrose Finland, MD      . Derrill Memo ON 01/18/2017] predniSONE (DELTASONE) tablet 25 mg  25 mg Oral Q breakfast Valda Lamb, Prentiss Bells, MD       Followed by  . [START ON 01/19/2017] predniSONE (DELTASONE) tablet 20 mg  20 mg Oral Q breakfast Philipp Ovens, MD       Followed by  . [START ON 01/20/2017] predniSONE (DELTASONE) tablet 15 mg  15 mg Oral Q breakfast Philipp Ovens, MD       Followed by  . [START ON 01/21/2017] predniSONE (DELTASONE) tablet 10 mg  10 mg Oral Q breakfast Philipp Ovens, MD       Followed by  . [START ON 01/22/2017] predniSONE (DELTASONE) tablet 5 mg  5 mg Oral Q breakfast Valda Lamb, [place], MD      . spironolactone (ALDACTONE) tablet 25 mg  25 mg Oral Daily Rankin, Shuvon B, NP   25 mg at 01/17/17 0807  . traZODone (DESYREL) tablet 50 mg  50 mg Oral QHS PRN Ambrose Finland, MD        PTA Medications: Prescriptions Prior  to Admission  Medication Sig Dispense Refill Last Dose  . atorvastatin (LIPITOR) 40 MG tablet Take 40 mg by mouth daily.   Past Month at Unknown time  . blood glucose meter kit and supplies Dispense based on patient and insurance preference. Use up to four times daily as directed. (FOR ICD-9 250.00, 250.01). 1 each 0 Taking  . carvedilol (COREG) 25 MG tablet Take 1 tablet (25 mg total) by mouth 2 (two) times daily with a meal. 180 tablet 3 Past Month at Unknown time  . furosemide (LASIX) 20 MG tablet Take 1 tablet (20 mg total) by mouth daily. 90 tablet 3 Past Month at Unknown time  . insulin aspart protamine- aspart (NOVOLOG MIX 70/30) (70-30) 100 UNIT/ML injection Inject 0.1-0.15 mLs (10-15 Units total) into the skin 2 (two) times daily with a meal. Sliding scale 10 mL 11 Past Month at Unknown time   . insulin glargine (LANTUS) 100 UNIT/ML injection Inject 0.15 mLs (15 Units total) into the skin at bedtime. Sliding scale 10 mL 1 Past Month at Unknown time  . Insulin Syringe-Needle U-100 (INSULIN SYRINGE .3CC/31GX5/16") 31G X 5/16" 0.3 ML MISC Use with lantus, once daily 30 each 0 Taking  . lisinopril (PRINIVIL,ZESTRIL) 20 MG tablet Take 1 tablet (20 mg total) by mouth daily. 90 tablet 3 Past Month at Unknown time  . metFORMIN (GLUCOPHAGE) 1000 MG tablet Take 1 tablet (1,000 mg total) by mouth 2 (two) times daily with a meal. 60 tablet 0 Past Month at Unknown time  . spironolactone (ALDACTONE) 25 MG tablet Take 1 tablet (25 mg total) by mouth daily. 90 tablet 3 Past Month at Unknown time    Treatment Modalities: Medication Management, Group therapy, Case management,  1 to 1 session with clinician, Psychoeducation, Recreational therapy.  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 Primary Diagnosis: MDD COCAINE USE DISORDER; SEVERE Long Term Goal(s): Improvement in symptoms so as ready for discharge Short Term Goals: Ability to identify changes in lifestyle to reduce recurrence of condition will improve Ability to verbalize feelings will improve Ability to disclose and discuss suicidal ideas Ability to demonstrate self-control will improve Ability to identify and develop effective coping behaviors will improve Ability to maintain clinical measurements within normal limits will improve Compliance with prescribed medications will improve Ability to identify triggers associated with substance abuse/mental health issues will improve  Medication Management: Evaluate patient's response, side effects, and tolerance of medication regimen.  Therapeutic Interventions: 1 to 1 sessions, Unit Group  sessions and Medication administration.  Evaluation of Outcomes: Progressing  Physician Treatment Plan for Secondary Diagnosis: Active Problems:   MDD (major depressive disorder)  Long Term Goal(s): Improvement in symptoms so as ready for discharge  Short Term Goals: Ability to identify changes in lifestyle to reduce recurrence of condition will improve Ability to verbalize feelings will improve Ability to disclose and discuss suicidal ideas Ability to demonstrate self-control will improve Ability to identify and develop effective coping behaviors will improve Ability to maintain clinical measurements within normal limits will improve Compliance with prescribed medications will improve Ability to identify triggers associated with substance abuse/mental health issues will improve  Medication Management: Evaluate patient's response, side effects, and tolerance of medication regimen.  Therapeutic Interventions: 1 to 1 sessions, Unit Group sessions and Medication administration.  Evaluation of Outcomes: Progressing  RN Treatment Plan for Primary Diagnosis: MDD COCAINE  USE DISORDER; SEVERE Long Term Goal(s): Knowledge of disease and therapeutic regimen to maintain health will improve  Short Term Goals: Ability to disclose and discuss suicidal ideas and Compliance with prescribed medications will improve  Medication Management: RN will administer medications as ordered by provider, will assess and evaluate patient's response and provide education to patient for prescribed medication. RN will report any adverse and/or side effects to prescribing provider.  Therapeutic Interventions: 1 on 1 counseling sessions, Psychoeducation, Medication administration, Evaluate responses to treatment, Monitor vital signs and CBGs as ordered, Perform/monitor CIWA, COWS, AIMS and Fall Risk screenings as ordered, Perform wound care treatments as ordered.  Evaluation of Outcomes: Progressing  LCSW Treatment  Plan for Primary Diagnosis: MDD COCAINE USE DISORDER; SEVERE Long Term Goal(s): Safe transition to appropriate next level of care at discharge, Engage patient in therapeutic group addressing interpersonal concerns. Short Term Goals: Engage patient in aftercare planning with referrals and resources, Increase ability to appropriately verbalize feelings, Identify triggers associated with mental health/substance abuse issues and Increase skills for wellness and recovery  Therapeutic Interventions: Assess for all discharge needs, 1 to 1 time with Social worker, Explore available resources and support systems, Assess for adequacy in community support network, Educate family and significant other(s) on suicide prevention, Complete Psychosocial Assessment, Interpersonal group therapy.  Evaluation of Outcomes: Progressing  Progress in Treatment: Attending groups: Intermittently  Participating in groups: Yes, when he attends. Taking medication as prescribed: Yes, MD continues to assess for medication changes as needed Toleration medication: Yes, no side effects reported at this time Family/Significant other contact made: No, pt declined contact. Patient understands diagnosis: Developing insight  Discussing patient identified problems/goals with staff: Yes Medical problems stabilized or resolved: Yes Denies suicidal/homicidal ideation: Yes  Issues/concerns per patient self-inventory: None Other: N/A  New problem(s) identified: None identified at this time.   New Short Term/Long Term Goal(s): None identified at this time.   Discharge Plan or Barriers: Pt will return home and follow with [facility].  Reason for Continuation of Hospitalization:  Anxiety  Depression Medication stabilization Suicidal ideation  Estimated Length of Stay: 1-3 days; Estimated discharge date 01/20/17  Attendees: Patient: 01/17/2017 9:54 AM  Physician: Dr. Parke Poisson 01/17/2017 9:54 AM   Nursing: Trinna Post RN; Patrice, RN 01/17/2017 9:54 AM  RN Care Manager:  01/17/2017 9:54 AM  Social Worker: Matthew Saras, [place] 01/17/2017 9:54 AM  Recreational Therapist:  01/17/2017 9:54 AM  Other: Lindell Spar, NP 01/17/2017 9:54 AM  Other:  01/17/2017 9:54 AM  Other: 01/17/2017 9:54 AM   Scribe for Treatment Team: Georga Kaufmann, MSW,LCSWA 01/17/2017 9:54 AM

## 2017-01-17 NOTE — Progress Notes (Signed)
Patient ID: James [name], male   DOB: 11-21-1960, 56 y.o.   MRN: [id number]  Pt currently presents with an anxious affect and behavior. Pt reports to writer that their goal is to "go to a support group tomorrow when I leave." Pt has concerns about being able to manage his mental and physical health post discharge.  Pt reports good sleep without taking any medication.  Pt provided with medications per providers orders. Pt's labs and vitals were monitored throughout the night. Pt given a 1:1 about emotional and mental status. Pt supported and encouraged to express concerns and questions. Pt educated on medications. Pt approaches nurse tonight and asks to take sleep medication, reports dream about almost using cocaine.  Pt's safety ensured with 15 minute and environmental checks. Pt currently denies SI/HI and A/V hallucinations. Pt verbally agrees to seek staff if SI/HI or A/VH occurs and to consult with staff before acting on any harmful thoughts. Will continue POC.

## 2017-01-17 NOTE — Progress Notes (Signed)
D: Pt presents with a flat affect and depressed mood. Pt rates depression 7/10, hopelessness 6/10 and anxiety 5/10. Pt denies SI. Pt denies any withdrawal symptoms today. Pt reports poor sleep last night. Pt c/o pain to his left forearm wound. Pt stated that his wound has been draining for one week and he's worried about it not healing properly. Pt requested a wound consult due to having DM and the amount of time it's taking for his wound to heal. Dr. Jama Flavors made aware and will order a wound consult.  A: Medications administered as ordered per MD. Verbal support provided. Pt encouraged to attend groups. 15 minute checks performed for safety.  R: Pt compliant with tx.

## 2017-01-17 NOTE — Progress Notes (Signed)
Wound cleansed, oinment applied and dressing changed. Pt assessed by wound nurse.

## 2017-01-17 NOTE — Progress Notes (Signed)
[facility] MD Progress Note  01/17/2017 4:41 PM James [name]  MRN:  [id number]  Subjective: James [name] reports, I'm doing pretty good today. I have got to go home tomorrow. Got a court date on Wednesday". At this time denies any suicidal ideations, and is future oriented, stating he is hoping for discharge before next Tuesday as has a court date to attend . Denies hallucinations. He worries about medical issues, and states he realizes that his DM is currently poorly controlled. He also reports recent lesion on forearm, which he feels might have been an insect bite, progressing and ulcerating . Denies fever or chills or associated systemic symptoms. Currently denies medication side effects.   Objective: I have reviewed chart notes and have met with patient. 56 year old male, who presented for worsening depression. He had relapsed on cocaine  in July following several months of sobriety.  Reports concerns about physical health, in particular regarding DM, skin infection. States " I have lost weight too, my wife worries, and asked me whether I may have HIV". At this time presents calm, pleasant and cooperative on approach, describes partially improved mood compared to how he felt prior to admission. Denies SI, contracts for safety, and does not endorse psychotic symptoms. As per staff/RN notes, has presented with depressed, flat affect,has denied suicidal ideations. No psychotic symptoms. He denies medication side effects.  TSH 2.155  Principal Problem:  Depression, Substance Abuse  Diagnosis:   Patient Active Problem List   Diagnosis Date Noted  . MDD (major depressive disorder) [F32.9] 01/14/2017  . Cellulitis of left foot [L03.116] 09/28/2016  . Cellulitis [L03.90] 09/28/2016  . Cardiomyopathy- suspect NICM but etiology not yet determined [I42.9] 04/27/2016  . Acute combined systolic and diastolic heart failure ([place]) [I50.41] 04/27/2016  . History of major depression [Z86.59] 04/27/2016  .  Community acquired pneumonia [J18.9] 04/26/2016  . Acute respiratory failure with hypoxia ([place]) [J96.01] 04/26/2016  . Controlled type 2 diabetes mellitus with hyperglycemia ([place]) [E11.65] 04/26/2016  . Uncontrolled type 2 diabetes mellitus with hyperglycemia ([place]) [E11.65] 04/26/2016  . 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 MDD (major depressive disorder), recurrent severe, without psychosis ([place]) [F33.2] 06/26/2015  . History of cocaine abuse [Z87.898] 06/25/2015  . 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 MUSCLE SPASM, BACK [M53.80] 02/12/2009  . 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 HYPERCHOLESTEROLEMIA [E78.00] 05/09/2007  . SINUSITIS, CHRONIC [J32.9] 05/01/2007  . ALLERGIC RHINITIS [J30.9] 12/21/2006  . Essential hypertension [I10] 12/07/2006   Total Time spent with patient: 20 minutes   Past Medical History:  Past Medical History:  Diagnosis Date  . Cellulitis and abscess of foot 09/29/2016  . Chronic systolic heart failure ([place])   . Depression   . Diabetes mellitus without complication ([place])   . Essential hypertension   . NICM (nonischemic cardiomyopathy) ([place]) EF 20-25%     Past Surgical History:  Procedure Laterality Date  . CARDIAC CATHETERIZATION N/A 04/30/2016   Procedure: Right/Left Heart Cath and Coronary Angiography;  Surgeon: Nelva Bush, MD;  Location: [facility];  Service: Cardiovascular;  Laterality: N/A;  . FINGER SURGERY     Family History:  Family History  Problem Relation Age of Onset  . Diabetes Other   . Hypertension Other   . Diabetes Mother   . Brain cancer Father   . Mental illness Brother   . Drug abuse Brother   . Diabetes Brother    Social History:  History  Alcohol Use No    Comment: none as of 04/26/16     History  Drug Use No    Comment: none as of 02/15/2016    Social History   Social History  . Marital status: Married    Spouse  name: N/A  . Number of children: N/A  . Years of education: N/A   Social History Main Topics  . Smoking status: Former Smoker    Packs/day: 0.50    Years: 23.00    Types: Cigarettes    Quit date: 04/26/2016  . Smokeless tobacco: Never Used  . Alcohol use No     Comment: none as of 04/26/16  . Drug use: No     Comment: none as of 02/15/2016  . Sexual activity: Not Currently   Other Topics Concern  . None   Social History Narrative  . None   Additional Social History:   Sleep: improving  Appetite:  Fair  Current Medications: Current Facility-Administered Medications  Medication Dose Route Frequency Provider Last Rate Last Dose  . acetaminophen (TYLENOL) tablet 650 mg  650 mg Oral Q4H PRN Rankin, Shuvon B, NP   650 mg at 01/17/17 1617  . alum & mag hydroxide-simeth (MAALOX/MYLANTA) 200-200-20 MG/5ML suspension 30 mL  30 mL Oral Q4H PRN Rankin, Shuvon B, NP      . atorvastatin (LIPITOR) tablet 40 mg  40 mg Oral Daily Rankin, Shuvon B, NP   40 mg at 01/17/17 0807  . buPROPion (WELLBUTRIN XL) 24 hr tablet 150 mg  150 mg Oral Daily Ambrose Finland, MD   150 mg at 01/17/17 0807  . carvedilol (COREG) tablet 25 mg  25 mg Oral BID WC Rankin, Shuvon B, NP   25 mg at 01/17/17 0807  . clindamycin (CLEOCIN) capsule 300 mg  300 mg Oral Q6H Gennaro Africa, MD   300 mg at 01/17/17 1211  . feeding supplement (GLUCERNA SHAKE) (GLUCERNA SHAKE) liquid 237 mL  237 mL Oral TID BM Ambrose Finland, MD   237 mL at 01/17/17 1450  . furosemide (LASIX) tablet 20 mg  20 mg Oral Daily Rankin, Shuvon B, NP   20 mg at 01/17/17 0807  . insulin aspart (novoLOG) injection 0-9 Units  0-9 Units Subcutaneous TID WC Gennaro Africa, MD   5 Units at 01/17/17 1210  . insulin aspart protamine- aspart (NOVOLOG MIX 70/30) injection 10 Units  10 Units Subcutaneous BID WC Derrill Center, NP   10 Units at 01/16/17 1711  . insulin glargine (LANTUS) injection 15 Units  15 Units Subcutaneous QHS Rankin, Shuvon B,  NP   15 Units at 01/16/17 2231  . magnesium hydroxide (MILK OF MAGNESIA) suspension 30 mL  30 mL Oral Daily PRN Rankin, Shuvon B, NP      . metFORMIN (GLUCOPHAGE) tablet 1,000 mg  1,000 mg Oral BID WC Rankin, Shuvon B, NP   1,000 mg at 01/17/17 0807  . multivitamins with iron tablet 1 tablet  1 tablet Oral Daily Ambrose Finland, MD   1 tablet at 01/17/17 0807  . mupirocin cream (BACTROBAN) 2 %   Topical Daily Valda Lamb, Prentiss Bells, MD   1 application at 85/88/50 1608  . neomycin-bacitracin-polymyxin (NEOSPORIN) ointment   Topical Daily Ambrose Finland, MD      . Derrill Memo ON 01/18/2017] predniSONE (DELTASONE) tablet 25 mg  25 mg Oral Q breakfast Valda Lamb, Prentiss Bells, MD       Followed by  . [START ON 01/19/2017] predniSONE (DELTASONE) tablet 20 mg  20 mg Oral Q breakfast Valda Lamb, Prentiss Bells, MD       Followed by  . [START ON 01/20/2017] predniSONE (DELTASONE) tablet 15 mg  15 mg Oral Q breakfast Philipp Ovens, MD       Followed by  . [START ON 01/21/2017] predniSONE (DELTASONE) tablet 10 mg  10 mg Oral Q breakfast Philipp Ovens, MD       Followed by  . [START ON 01/22/2017] predniSONE (DELTASONE) tablet 5 mg  5 mg Oral Q breakfast Valda Lamb, [place], MD      . spironolactone (ALDACTONE) tablet 25 mg  25 mg Oral Daily Rankin, Shuvon B, NP   25 mg at 01/17/17 0807  . traZODone (DESYREL) tablet 50 mg  50 mg Oral QHS PRN Ambrose Finland, MD        Lab Results:  Results for orders placed or performed during the hospital encounter of 01/14/17 (from the past 48 hour(s))  Glucose, capillary     Status: Abnormal   Collection Time: 01/15/17  9:34 PM  Result Value Ref Range   Glucose-Capillary 244 (H) 65 - 99 mg/dL  Glucose, capillary     Status: Abnormal   Collection Time: 01/16/17  6:09 AM  Result Value Ref Range   Glucose-Capillary 154 (H) 65 - 99 mg/dL  Vitamin B12     Status: None   Collection Time: 01/16/17   6:22 AM  Result Value Ref Range   Vitamin B-12 406 180 - 914 pg/mL    Comment: (NOTE) This assay is not validated for testing neonatal or myeloproliferative syndrome specimens for Vitamin B12 levels. Performed at [facility], [place] [address] [place], [place] [address]   Lipid panel     Status: None   Collection Time: 01/16/17  6:22 AM  Result Value Ref Range   Cholesterol 147 0 - 200 mg/dL   Triglycerides 47 <150 mg/dL   HDL 72 >40 mg/dL   Total CHOL/HDL Ratio 2.0 RATIO   VLDL 9 0 - 40 mg/dL   LDL Cholesterol 66 0 - 99 mg/dL    Comment:        Total Cholesterol/HDL:CHD Risk Coronary Heart Disease Risk Table                     Men   Women  1/2 Average Risk   3.4   3.3  Average Risk       5.0   4.4  2 X Average Risk   9.6   7.1  3 X Average Risk  23.4   11.0        Use the calculated Patient Ratio above and the CHD Risk Table to determine the patient's CHD Risk.        ATP III CLASSIFICATION (LDL):  <100     mg/dL   Optimal  100-129  mg/dL   Near or Above                    Optimal  130-159  mg/dL   Borderline  160-189  mg/dL   High  >190     mg/dL   Very High Performed at [facility] [address] [place], [place] [address]   TSH     Status: None   Collection Time: 01/16/17  6:22 AM  Result Value Ref Range   TSH 2.155 0.350 - 4.500 uIU/mL    Comment: Performed by a 3rd Generation assay with a functional  sensitivity of <=0.01 uIU/mL. Performed at [facility], [place] [address], [place], [place] [address]   Prolactin     Status: None   Collection Time: 01/16/17  6:22 AM  Result Value Ref Range   Prolactin 10.7 4.0 - 15.2 ng/mL    Comment: (NOTE) Performed At: [facility] [place], [place] [id number] Lindon Romp MD SW:[id number] Performed at [facility], [place] [address] [place], [place] [address]   Glucose, capillary     Status: Abnormal   Collection Time: 01/16/17  4:59 PM   Result Value Ref Range   Glucose-Capillary 225 (H) 65 - 99 mg/dL  Glucose, capillary     Status: Abnormal   Collection Time: 01/16/17  9:16 PM  Result Value Ref Range   Glucose-Capillary 177 (H) 65 - 99 mg/dL   Comment 1 Notify RN    Comment 2 Document in Chart   Glucose, capillary     Status: Abnormal   Collection Time: 01/17/17  6:38 AM  Result Value Ref Range   Glucose-Capillary 117 (H) 65 - 99 mg/dL  Glucose, capillary     Status: Abnormal   Collection Time: 01/17/17 12:08 PM  Result Value Ref Range   Glucose-Capillary 299 (H) 65 - 99 mg/dL    Blood Alcohol level:  Lab Results  Component Value Date   ETH <10 01/13/2017   ETH <5 42/70/6237    Metabolic Disorder Labs: Lab Results  Component Value Date   HGBA1C 13.1 (H) 09/29/2016   MPG 329 09/29/2016   MPG 332 04/27/2016   Lab Results  Component Value Date   PROLACTIN 10.7 01/16/2017   Lab Results  Component Value Date   CHOL 147 01/16/2017   TRIG 47 01/16/2017   HDL 72 01/16/2017   CHOLHDL 2.0 01/16/2017   VLDL 9 01/16/2017   LDLCALC 66 01/16/2017   LDLCALC 71 04/28/2016    Physical Findings: AIMS: Facial and Oral Movements Muscles of Facial Expression: None, normal Lips and Perioral Area: None, normal Jaw: None, normal Tongue: None, normal,Extremity Movements Upper (arms, wrists, hands, fingers): None, normal Lower (legs, knees, ankles, toes): None, normal, Trunk Movements Neck, shoulders, hips: None, normal, Overall Severity Severity of abnormal movements (highest score from questions above): None, normal Incapacitation due to abnormal movements: None, normal Patient's awareness of abnormal movements (rate only patient's report): No Awareness, Dental Status Current problems with teeth and/or dentures?: No Does patient usually wear dentures?: No  CIWA:    COWS:     Musculoskeletal: Strength & Muscle Tone: within normal limits Gait & Station: normal Patient leans: N/A  Psychiatric Specialty  Exam: Physical Exam  ROS denies chest pain, no shortness of breath, no vomiting, no increased pain on skin abscess area, no fever, no chills .   Blood pressure 135/61, pulse 86, temperature 98 F (36.7 C), temperature source Oral, resp. rate 12, height 6' (1.829 m), weight 61.2 kg (135 lb), SpO2 100 %.Body mass index is 18.31 kg/m.  General Appearance: Fairly Groomed  Eye Contact:  Good  Speech:  Normal Rate  Volume:  Normal  Mood:  depressed, but states feeling better compared to admission   Affect:  vaguely constricted, but reactive and does smile at times appropriately   Thought Process:  Linear and Descriptions of Associations: Intact  Orientation:  Other:  fully alert and attentive   Thought Content:  denies hallucinations, no delusions, not internally preoccupied   Suicidal Thoughts:  No- at this time denies  suicidal plan or intention, denies any self injurious ideations, contracts for safety on unit, denies homicidal ideations   Homicidal Thoughts:  No  Memory:  recent and remote grossly intact   Judgement:  Other:  improving   Insight:  improving   Psychomotor Activity:  Normal  Concentration:  Concentration: Good and Attention Span: Good  Recall:  Good  Fund of Knowledge:  Good  Language:  Good  Akathisia:  Negative  Handed:  Right  AIMS (if indicated):     Assets:  Communication Skills Desire for Improvement Social Support  ADL's:  Intact  Cognition:  WNL  Sleep:      Assessment : Patient reports partially improved mood, and denies suicidal ideations at this time. Is future oriented, and focusing on discharging soon. He reports concerns about general decline in physical health, including poorly controlled DM, weight loss, and skin infection. ( No fever, no chills, WBC 5.1 ) . He worries about HIV status and requests testing. Of note, states Wellbutrin XL is helping and well tolerated . We discussed potential dangerous interactions between this antidepressant and  cocaine,and discussed changing to another antidepressant type based on this. He declined, states he does not want to change Wellbutrin as it is helping, well tolerated, and states being on it will help him abstain from illicit drugs/cocaine .  Treatment Plan Summary: Daily contact with patient to assess and evaluate symptoms and progress in treatment, Medication management, Plan inpatient treatment  and medications as below    Will continue today 01/17/17 plan as below except where it is noted.  Encourage group and milieu participation to work on Radiographer, therapeutic and symptom reduction Encourage ongoing efforts to work on sobriety and relapse prevention  Treatment team working on disposition planning options Continue Wellbutrin XL 150 mgrs QDAY for depression Continue Trazodone 50 mgrs QHS PRN for insomnia  Continue DM management  Continue dressing changes to L forearm abscess . At patient's request will order HIV testing, result pending.   Encarnacion Slates, NP, PMHNP, FNP-BC. 01/17/2017, 4:41 PMPatient ID: James [name], male   DOB: [birth year], 57 y.o.   MRN: [id number] Agree with NP Progress Note

## 2017-01-18 LAB — HIV ANTIBODY (ROUTINE TESTING W REFLEX): HIV Screen 4th Generation wRfx: NONREACTIVE

## 2017-01-18 LAB — GLUCOSE, CAPILLARY
GLUCOSE-CAPILLARY: 183 mg/dL — AB (ref 65–99)
Glucose-Capillary: 205 mg/dL — ABNORMAL HIGH (ref 65–99)

## 2017-01-18 MED ORDER — PREDNISONE 5 MG PO TABS: 5.0000 mg | ORAL_TABLET | Freq: Every day | ORAL | 0 refills | Status: DC

## 2017-01-18 MED ORDER — BUPROPION HCL ER (XL) 150 MG PO TB24: 150.0000 mg | ORAL_TABLET | Freq: Every day | ORAL | 0 refills | Status: DC

## 2017-01-18 MED ORDER — INSULIN ASPART PROT & ASPART (70-30 MIX) 100 UNIT/ML ~~LOC~~ SUSP: 10.0000 [IU] | Freq: Two times a day (BID) | SUBCUTANEOUS | 0 refills | Status: DC

## 2017-01-18 MED ORDER — MUPIROCIN CALCIUM 2 % EX CREA: TOPICAL_CREAM | Freq: Every day | CUTANEOUS | 0 refills | Status: DC

## 2017-01-18 MED ORDER — PREDNISONE 20 MG PO TABS: 20.0000 mg | ORAL_TABLET | Freq: Every day | ORAL | 0 refills | Status: DC

## 2017-01-18 MED ORDER — ATORVASTATIN CALCIUM 40 MG PO TABS: 40.0000 mg | ORAL_TABLET | Freq: Every day | ORAL | 0 refills | Status: DC

## 2017-01-18 MED ORDER — "INSULIN SYRINGE 31G X 5/16"" 0.3 ML MISC": 0 refills | Status: DC

## 2017-01-18 MED ORDER — CLINDAMYCIN HCL 300 MG PO CAPS: 300.0000 mg | ORAL_CAPSULE | Freq: Four times a day (QID) | ORAL | Status: DC

## 2017-01-18 MED ORDER — FUROSEMIDE 20 MG PO TABS: 20.0000 mg | ORAL_TABLET | Freq: Every day | ORAL | 0 refills | Status: DC

## 2017-01-18 MED ORDER — METFORMIN HCL 1000 MG PO TABS: 1000.0000 mg | ORAL_TABLET | Freq: Two times a day (BID) | ORAL | 0 refills | Status: DC

## 2017-01-18 MED ORDER — INSULIN GLARGINE 100 UNIT/ML ~~LOC~~ SOLN: 15.0000 [IU] | Freq: Every day | SUBCUTANEOUS | 0 refills | Status: DC

## 2017-01-18 MED ORDER — TRAZODONE HCL 50 MG PO TABS: 50.0000 mg | ORAL_TABLET | Freq: Every evening | ORAL | 0 refills | Status: DC | PRN

## 2017-01-18 MED ORDER — PREDNISONE 10 MG PO TABS: 10.0000 mg | ORAL_TABLET | Freq: Every day | ORAL | 0 refills | Status: DC

## 2017-01-18 MED ORDER — SPIRONOLACTONE 25 MG PO TABS: 25.0000 mg | ORAL_TABLET | Freq: Every day | ORAL | 0 refills | Status: DC

## 2017-01-18 MED ORDER — CARVEDILOL 25 MG PO TABS: 25.0000 mg | ORAL_TABLET | Freq: Two times a day (BID) | ORAL | 0 refills | Status: DC

## 2017-01-18 MED ORDER — PREDNISONE 5 MG PO TABS: 15.0000 mg | ORAL_TABLET | Freq: Every day | ORAL | 0 refills | Status: DC

## 2017-01-18 NOTE — BHH Suicide Risk Assessment (Signed)
[facility] Discharge Suicide Risk Assessment   Principal Problem: Substance Induced Mood Disorder Discharge Diagnoses:  Patient Active Problem List   Diagnosis Date Noted  . MDD (major depressive disorder) [F32.9] 01/14/2017  . Cellulitis of left foot [L03.116] 09/28/2016  . Cellulitis [L03.90] 09/28/2016  . Cardiomyopathy- suspect NICM but etiology not yet determined [I42.9] 04/27/2016  . Acute combined systolic and diastolic heart failure ([place]) [I50.41] 04/27/2016  . History of major depression [Z86.59] 04/27/2016  . Community acquired pneumonia [J18.9] 04/26/2016  . Acute respiratory failure with hypoxia ([place]) [J96.01] 04/26/2016  . Controlled type 2 diabetes mellitus with hyperglycemia ([place]) [E11.65] 04/26/2016  . Uncontrolled type 2 diabetes mellitus with hyperglycemia ([place]) [E11.65] 04/26/2016  . 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 MDD (major depressive disorder), recurrent severe, without psychosis ([place]) [F33.2] 06/26/2015  . History of cocaine abuse [Z87.898] 06/25/2015  . 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 MUSCLE SPASM, BACK [M53.80] 02/12/2009  . 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 HYPERCHOLESTEROLEMIA [E78.00] 05/09/2007  . SINUSITIS, CHRONIC [J32.9] 05/01/2007  . ALLERGIC RHINITIS [J30.9] 12/21/2006  . Essential hypertension [I10] 12/07/2006    Total Time spent with patient: 30 minutes  Musculoskeletal: Strength & Muscle Tone: within normal limits Gait & Station: normal Patient leans: N/A  Psychiatric Specialty Exam: Review of Systems  Constitutional: Negative.   HENT: Negative.   Eyes: Negative.   Respiratory: Negative.   Cardiovascular: Negative.   Gastrointestinal: Negative.   Genitourinary: Negative.   Musculoskeletal: Negative.   Skin: Negative.   Neurological: Negative.   Endo/Heme/Allergies: Negative.   Psychiatric/Behavioral: Negative for depression,  hallucinations, memory loss, substance abuse and suicidal ideas. The patient is not nervous/anxious and does not have insomnia.     Blood pressure 120/63, pulse 85, temperature 98 F (36.7 C), temperature source Oral, resp. rate 16, height 6' (1.829 m), weight 61.2 kg (135 lb), SpO2 100 %.Body mass index is 18.31 kg/m.  General Appearance: Neatly dressed, pleasant, engaging well and cooperative. Appropriate behavior. Not in any distress. Good relatedness. Not internally stimulated  Eye Contact::  Good  Speech:  Spontaneous, normal prosody. Normal tone and rate.   Volume:  Normal  Mood:  Euthymic  Affect:  Appropriate and Full Range  Thought Process:  Goal Directed  Orientation:  Full (Time, Place, and Person)  Thought Content:  No delusional theme. No preoccupation with violent thoughts. No negative ruminations. No obsession.  No hallucination in any modality.   Suicidal Thoughts:  No  Homicidal Thoughts:  No  Memory:  Immediate;   Good Recent;   Good Remote;   Good  Judgement:  Good  Insight:  Good  Psychomotor Activity:  Normal  Concentration:  Good  Recall:  Good  Fund of Knowledge:Good  Language: Good  Akathisia:  Negative  Handed:    AIMS (if indicated):     Assets:  Communication Skills Desire for Improvement Housing Intimacy Resilience Social Support  Sleep:  Number of Hours: 6.75  Cognition: WNL  ADL's:  Intact   Clinical Assessment::   56 y.o AAM married, lives with his wife. Background history of SUD. Presented to the ER voluntarily on account of suicidal thoughts. Expressed wish that God takes his life. Expressed thoughts of jumping in front of traffic or off a height. Reports recent relapse on cocaine. Had been sober for eight months.  Routine labs are significant for poor glucose control and associated electrolyte imbalance.  UDS was positive for cocaine. No  detectable alcohol.    Chart reviewed today. Patient discussed at team today. Reported to be back in his  baseline. Scheduled for discharge today. No concerns raised by his care givers. Reported to have been appropriate. He has not voiced any futility thoughts. He has been interacting well with peers. He has not been observed to be internally disturbed.   I met with him for the first time today. Says he made a mistake and that was what made him relapse. Says he agreed to help a friend buy drugs. Patient is pleased he came in here to seek help. Says symptoms he presented with has completely resolved. He is no longer having any death wish. No active suicidal thoughts. Says he has been through a lot of programs through the court. He has not failed any drug test since. Says he would be off probation next month. Patient is says he has court tomorrow for some money he owed. Not stressed by this hearing. No other stressors. Says his wife has been very supportive. He plans to eat healthy and manage his diabetes well. Not feeling depressed. Reports normal energy and interest. Has been maintaining normal biological functions. He is able to think clearly. He is able to focus on task. His thoughts are not crowded or racing. No evidence of mania. No hallucination in any modality. He is not making any delusional statement. No passivity of will/thought. He is fully in touch with reality. No thoughts of homicide. No violent thoughts. No overwhelming anxiety.no acces to weapons. No craving for substances.     Demographic Factors:  Male and Low socioeconomic status  Loss Factors: Decline in physical health and Legal issues  Historical Factors: Family history of mental illness or substance abuse and Impulsivity  Risk Reduction Factors:   Sense of responsibility to family, Living with another person, especially a relative, Positive social support, Positive therapeutic relationship and Positive coping skills or problem solving skills  Continued Clinical Symptoms:  As above  Cognitive Features That Contribute To Risk:   None    Suicide Risk:  Minimal: No identifiable suicidal ideation.  Patient is not having any thoughts of suicide at this time. Modifiable risk factors targeted during this admission includes depression and substance use. Demographical and historical risk factors cannot be modified. Patient is now engaging well. Patient is reliable and is future oriented. We have buffered patient's support structures. At this point, patient is at low risk of suicide. Patient is aware of the effects of psychoactive substances on decision making process. Patient has been provided with emergency contacts. Patient acknowledges to use resources provided if unforseen circumstances changes their current risk stratification.    Follow-up Information    [facility] Follow up on 01/18/2017.   Specialty:  Professional Counselor Why:  Appointment w therapist on 10/9 at 4 PM.  Please ask to be referred for medications management if desired.  Contact information: [facility] [address] [phone]           Plan Of Care/Follow-up recommendations:  1. Continue current psychotropic medications 2. Mental health and addiction follow up as arranged.  3. Provided limited quantity of prescriptions   Artist Beach, MD 01/18/2017, 10:46 AM

## 2017-01-18 NOTE — Discharge Summary (Signed)
Physician Discharge Summary Note  Patient:  James [name] is an 56 y.o., male  MRN:  [id number]  DOB:  [birth year]  Patient phone:  [phone] (home)   Patient address:   [address] [address],   Total Time spent with patient: Greater than 30 minutes  Date of Admission:  01/14/2017 Date of Discharge: 01-18-17  Reason for Admission: Suicidal ideations with plans triggered by recent relapse on Cocaine.  Principal Problem: Major depressive disorder.  Discharge Diagnoses: Patient Active Problem List   Diagnosis Date Noted  . MDD (major depressive disorder) [F32.9] 01/14/2017  . Cellulitis of left foot [L03.116] 09/28/2016  . Cellulitis [L03.90] 09/28/2016  . Cardiomyopathy- suspect NICM but etiology not yet determined [I42.9] 04/27/2016  . Acute combined systolic and diastolic heart failure ([place]) [I50.41] 04/27/2016  . History of major depression [Z86.59] 04/27/2016  . Community acquired pneumonia [J18.9] 04/26/2016  . Acute respiratory failure with hypoxia ([place]) [J96.01] 04/26/2016  . Controlled type 2 diabetes mellitus with hyperglycemia ([place]) [E11.65] 04/26/2016  . Uncontrolled type 2 diabetes mellitus with hyperglycemia ([place]) [E11.65] 04/26/2016  . 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 MDD (major depressive disorder), recurrent severe, without psychosis ([place]) [F33.2] 06/26/2015  . History of cocaine abuse [Z87.898] 06/25/2015  . 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 MUSCLE SPASM, BACK [M53.80] 02/12/2009  . 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 HYPERCHOLESTEROLEMIA [E78.00] 05/09/2007  . SINUSITIS, CHRONIC [J32.9] 05/01/2007  . ALLERGIC RHINITIS [J30.9] 12/21/2006  . Essential hypertension [I10] 12/07/2006   Past Psychiatric History: MDD, Alcohol use disorder.  Past Medical History:  Past Medical History:  Diagnosis Date  . Cellulitis and abscess of foot  09/29/2016  . Chronic systolic heart failure ([place])   . Depression   . Diabetes mellitus without complication ([place])   . Essential hypertension   . NICM (nonischemic cardiomyopathy) ([place]) EF 20-25%     Past Surgical History:  Procedure Laterality Date  . CARDIAC CATHETERIZATION N/A 04/30/2016   Procedure: Right/Left Heart Cath and Coronary Angiography;  Surgeon: Nelva Bush, MD;  Location: [facility];  Service: Cardiovascular;  Laterality: N/A;  . FINGER SURGERY     Family History:  Family History  Problem Relation Age of Onset  . Diabetes Other   . Hypertension Other   . Diabetes Mother   . Brain cancer Father   . Mental illness Brother   . Drug abuse Brother   . Diabetes Brother    Family Psychiatric  History: See H&P.  Social History:  History  Alcohol Use No    Comment: none as of 04/26/16     History  Drug Use No    Comment: none as of 02/15/2016    Social History   Social History  . Marital status: Married    Spouse name: N/A  . Number of children: N/A  . Years of education: N/A   Social History Main Topics  . Smoking status: Former Smoker    Packs/day: 0.50    Years: 23.00    Types: Cigarettes    Quit date: 04/26/2016  . Smokeless tobacco: Never Used  . Alcohol use No     Comment: none as of 04/26/16  . Drug use: No     Comment: none as of 02/15/2016  . Sexual activity: Not Currently   Other Topics Concern  . None   Social History Narrative  . None   Hospital Course: (Per Md's SR):  Maurice is a 56 y.o AAM married, lives with his wife. Background history of SUD. Presented to the ER voluntarily on account of suicidal thoughts. Expressed & wish that God takes his life. Expressed thoughts of jumping in front of traffic or off a height. Reports recent relapse on cocaine. Had been sober for eight months.  Routine labs are significant for poor glucose control and associated electrolyte imbalance.  UDS was positive for cocaine. No detectable alcohol.     After evaluation of his presenting symptoms; Barry was started on the medication regimen for his presenting symptoms. He received & was discharged on Wellbutrin XL 150 mg for depression & Trazodone 50 mg for insomnia. However, he did not receive any detoxification treatments. This is because Cocaine intoxication/withdrawal symptoms as of date has no established detoxification treatment protocols. He was monitored closely & his withdrawal symptoms managed on prn basis. He presented other significant chronic medical issues that required treatment & or monitoring. He was resumed on all her pertinent home medications for those health issues reported. He tolerated his treatment regimen without any adverse effects or reactions reported. He was enrolled in the group counseling sessions being offered & held on this unit. He learned coping skills.  Dajuan's admission content within his chart reviewed today. And his case discussed at the treatment team meeting this morning. He reported to be back to his baseline. He is scheduled for discharge today. No concerns raised by his care givers. Reported to have been appropriate. He has not voiced any futility thoughts. He has been interacting well with peers. He has not been observed to be internally disturbed.   The attending psychiatrist met with him for the first time today. He says he made a mistake and that was what made him relapse. He says he agreed to help a friend buy drugs. Patient is pleased he came in here to seek help. Says symptoms he presented with has completely resolved. He is no longer having any death wish. No active suicidal thoughts. Says he has been through a lot of programs through the court. He has not failed any drug test since. Says he would be off probation next month. Patient is says he has court tomorrow for some money he owed. Not stressed by this hearing. No other stressors. Says his wife has been very supportive. He plans to eat healthy  and manage his diabetes well.   Argel is not feeling depressed. Reports normal energy and interest. Has been maintaining normal biological functions. He is able to think clearly. He is able to focus on task. His thoughts are not crowded or racing. No evidence of mania. No hallucination in any modality. He is not making any delusional statement. No passivity of will/thought. He is fully in touch with reality. No thoughts of homicide. No violent thoughts. No overwhelming anxiety.no acces to weapons. No craving for substances.   Deaundre is currently presenting medically & mentally stable. He is being discharged to his home to continue mental & medical health care on an outpatient basis as noted below. He has been referred to the [facility] here in [place], [place] for all his medical care needs. He is provided with all the necessary information needed make these appointments without problems. He also received a 7 days worth supply samples of his [facility] discharge medications including his remaining antibiotic therapy for his wound infection. He left [facility] with all personal belongings in no apparent distress. Transportation per the city bus.  [place] assisted with bus pass  Physical Findings: AIMS: Facial and Oral Movements Muscles of Facial Expression: None, normal Lips and Perioral Area: None, normal Jaw: None, normal Tongue: None, normal,Extremity Movements Upper (arms, wrists, hands, fingers): None, normal Lower (legs, knees, ankles, toes): None, normal, Trunk Movements Neck, shoulders, hips: None, normal, Overall Severity Severity of abnormal movements (highest score from questions above): None, normal Incapacitation due to abnormal movements: None, normal Patient's awareness of abnormal movements (rate only patient's report): No Awareness, Dental Status Current problems with teeth and/or dentures?: No Does patient usually wear dentures?: No  CIWA:    COWS:      Musculoskeletal: Strength & Muscle Tone: within normal limits Gait & Station: normal Patient leans: N/A  Psychiatric Specialty Exam: Physical Exam  Constitutional: He appears well-developed.  HENT:  Head: Normocephalic.  Eyes: Pupils are equal, round, and reactive to light.  Neck: Normal range of motion.  Cardiovascular: Normal rate.   Respiratory: Effort normal.  GI: Soft.  Genitourinary:  Genitourinary Comments: Deferred  Musculoskeletal: Normal range of motion.  Neurological: He is alert.  Skin: Skin is warm.    Review of Systems  Constitutional: Negative.   HENT: Negative.   Eyes: Negative.   Respiratory: Negative.   Cardiovascular: Negative.   Gastrointestinal: Negative.   Genitourinary: Negative.   Musculoskeletal: Negative.   Skin: Negative.   Neurological: Negative.   Endo/Heme/Allergies: Negative.   Psychiatric/Behavioral: Positive for depression (Stable) and substance abuse (Hx. Cocaine & alcohol use disorder). Negative for hallucinations, memory loss and suicidal ideas. The patient has insomnia (Stable). The patient is not nervous/anxious.     Blood pressure 120/63, pulse 85, temperature 98 F (36.7 C), temperature source Oral, resp. rate 16, height 6' (1.829 m), weight 61.2 kg (135 lb), SpO2 100 %.Body mass index is 18.31 kg/m.  See Md's SRA.   Have you used any form of tobacco in the last 30 days? (Cigarettes, Smokeless Tobacco, Cigars, and/or Pipes): Yes  Has this patient used any form of tobacco in the last 30 days? (Cigarettes, Smokeless Tobacco, Cigars, and/or Pipes): No  Blood Alcohol level:  Lab Results  Component Value Date   ETH <10 01/13/2017   ETH <5 56/81/2751   Metabolic Disorder Labs:  Lab Results  Component Value Date   HGBA1C 13.1 (H) 09/29/2016   MPG 329 09/29/2016   MPG 332 04/27/2016   Lab Results  Component Value Date   PROLACTIN 10.7 01/16/2017   Lab Results  Component Value Date   CHOL 147 01/16/2017   TRIG 47  01/16/2017   HDL 72 01/16/2017   CHOLHDL 2.0 01/16/2017   VLDL 9 01/16/2017   LDLCALC 66 01/16/2017   LDLCALC 71 04/28/2016   See Psychiatric Specialty Exam and Suicide Risk Assessment completed by Attending Physician prior to discharge.  Discharge destination:  Home  Is patient on multiple antipsychotic therapies at discharge:  No   Has Patient had three or more failed trials of antipsychotic monotherapy by history:  No  Recommended Plan for Multiple Antipsychotic Therapies: NA  Allergies as of 01/18/2017   No Known Allergies     Medication List    STOP taking these medications   blood glucose meter kit and supplies   lisinopril 20 MG tablet Commonly known as:  PRINIVIL,ZESTRIL     TAKE these medications     Indication  atorvastatin 40 MG tablet Commonly known as:  LIPITOR Take 1 tablet (40 mg total) by mouth daily. For high cholesterol What changed:  additional instructions  Indication:  Inherited Homozygous Hypercholesterolemia, Increased Fats, Triglycerides & Cholesterol in the Blood   buPROPion 150 MG 24 hr tablet Commonly known as:  WELLBUTRIN XL Take 1 tablet (150 mg total) by mouth daily. For depression  Indication:  Attention Deficit Hyperactivity Disorder   carvedilol 25 MG tablet Commonly known as:  COREG Take 1 tablet (25 mg total) by mouth 2 (two) times daily with a meal.  Indication:  High Blood Pressure of Unknown Cause   clindamycin 300 MG capsule Commonly known as:  CLEOCIN Take 1 capsule (300 mg total) by mouth every 6 (six) hours. For skin infection  Indication:  Skin infection   furosemide 20 MG tablet Commonly known as:  LASIX Take 1 tablet (20 mg total) by mouth daily. For swellings What changed:  additional instructions  Indication:  Edema, High Blood Pressure Disorder   insulin aspart protamine- aspart (70-30) 100 UNIT/ML injection Commonly known as:  NOVOLOG MIX 70/30 Inject 0.1 mLs (10 Units total) into the skin 2 (two) times daily  with a meal. For diabetes management What changed:  how much to take  additional instructions  Indication:  Type 2 Diabetes   insulin glargine 100 UNIT/ML injection Commonly known as:  LANTUS Inject 0.15 mLs (15 Units total) into the skin at bedtime. For diabetes management What changed:  additional instructions  Indication:  Type 2 Diabetes   INSULIN SYRINGE .3CC/31GX5/16" 31G X 5/16" 0.3 ML Misc Use with lantus, once daily: For diabetes management What changed:  additional instructions  Indication:  Diabetes management   metFORMIN 1000 MG tablet Commonly known as:  GLUCOPHAGE Take 1 tablet (1,000 mg total) by mouth 2 (two) times daily with a meal. For diabetes management What changed:  additional instructions  Indication:  Type 2 Diabetes   mupirocin cream 2 % Commonly known as:  BACTROBAN Apply topically daily. For wound care  Indication:  Wound care   predniSONE 20 MG tablet Commonly known as:  DELTASONE Take 1 tablet (20 mg total) by mouth daily with breakfast. For inflammation  Indication:  Inflammation   predniSONE 5 MG tablet Commonly known as:  DELTASONE Take 3 tablets (15 mg total) by mouth daily with breakfast. For inflammation Start taking on:  01/20/2017  Indication:  Infammation   predniSONE 10 MG tablet Commonly known as:  DELTASONE Take 1 tablet (10 mg total) by mouth daily with breakfast. For inflammation Start taking on:  01/21/2017  Indication:  Inflammation   predniSONE 5 MG tablet Commonly known as:  DELTASONE Take 1 tablet (5 mg total) by mouth daily with breakfast. For inflammation Start taking on:  01/22/2017  Indication:  Inflammation   spironolactone 25 MG tablet Commonly known as:  ALDACTONE Take 1 tablet (25 mg total) by mouth daily. For high blood pressure What changed:  additional instructions  Indication:  High Blood Pressure Disorder   traZODone 50 MG tablet Commonly known as:  DESYREL Take 1 tablet (50 mg total) by mouth at  bedtime as needed for sleep.  Indication:  Trouble Sleeping      Follow-up Information    [facility] Follow up on 01/18/2017.   Specialty:  Professional Counselor Why:  Appointment w therapist on 10/9 at 4 PM.  Please ask to be referred for medications management if desired.  Contact information: [employer] [facility] [place] [address] [phone]        [facility]  Follow up.   Why:  You will be contacted at discharge with appt time and date for primary care follow-up. Thank you.  Contact information: [address] [place] [address] [phone]         Follow-up recommendations: Activity:  As tolerated Diet: As recommended by your primary care doctor. Keep all scheduled follow-up appointments as recommended.   Comments: Patient is instructed prior to discharge to: Take all medications as prescribed by his/her mental healthcare provider. Report any adverse effects and or reactions from the medicines to his/her outpatient provider promptly. Patient has been instructed & cautioned: To not engage in alcohol and or illegal drug use while on prescription medicines. In the event of worsening symptoms, patient is instructed to call the crisis hotline, 911 and or go to the nearest ED for appropriate evaluation and treatment of symptoms. To follow-up with his/her primary care provider for your other medical issues, concerns and or health care needs.   Signed: Encarnacion Slates, NP, PMHNP, FNP-BC 01/18/2017, 3:41 PM

## 2017-01-18 NOTE — Progress Notes (Signed)
[facility] Group Notes:  (Nursing/MHT/Case Management/Adjunct)  Date:  01/18/2017  Time:  12:25 AM  Type of Therapy:  Psychoeducational Skills  Participation Level:  Active  Participation Quality:  Appropriate  Affect:  Appropriate  Cognitive:  Appropriate  Insight:  Good  Engagement in Group:  Engaged  Modes of Intervention:  Education  Summary of Progress/Problems: Patient expressed his gratitude for being alive. He states that he might be discharged on Tuesday and that he feels that he is ready to go home. He admits to having relapsed on drugs after having been sober for more than a year and that he has had a number of medical issues as a result of his past. He has adult aged children and has spoken of them with admiration. In addition, he feels that he can go back to attending groups and attending outpatient counseling.   James [name] S 01/18/2017, 12:25 AM

## 2017-01-18 NOTE — Progress Notes (Signed)
Pt discharged home on a taxi. Pt was ambulatory, stable and appreciative at that time. All papers and prescriptions were given and valuables returned. Verbal understanding expressed. Denies SI/HI and A/VH. Pt given opportunity to express concerns and ask questions.

## 2017-01-18 NOTE — Progress Notes (Addendum)
  [facility] Adult Case Management Discharge Plan :  Will you be returning to the same living situation after discharge:  Yes,  home At discharge, do you have transportation home?: Yes,  bus pass provided. Do you have the ability to pay for your medications: Yes,  mental health  Release of information consent forms completed and submitted to medical records by CSW.  Patient to Follow up at: Follow-up Information    [facility] Follow up on 01/18/2017.   Specialty:  Professional Counselor Why:  Appointment w therapist on 10/9 at 4 PM.  Please ask to be referred for medications management if desired.  Contact information: [employer] [facility] the [place] [address] [place] [address] [phone]        [facility] Follow up.   Why:  You will be contacted at discharge with appt time and date for primary care follow-up. Thank you.  Contact information: [address] [place] [address] [phone]          Next level of care provider has access to [facility] Link:no  Safety Planning and Suicide Prevention discussed: Yes,  SPE completed with pt; pt declined to consent to family contact. SPI pamphlet and Mobile Crisis information provided.  Have you used any form of tobacco in the last 30 days? (Cigarettes, Smokeless Tobacco, Cigars, and/or Pipes): Yes  Has patient been referred to the Quitline?: Patient refused referral  Patient has been referred for addiction treatment: Yes  [employer], LCSW 01/18/2017, 12:43 PM

## 2017-02-23 ENCOUNTER — Inpatient Hospital Stay (HOSPITAL_COMMUNITY)
Admission: AD | Admit: 2017-02-23 | Discharge: 2017-02-27 | DRG: 885 | Disposition: A | Discharge status: Home / Self Care | Payer: Federal, State, Local not specified - Other | Source: Intra-hospital | Attending: Psychiatry | Admitting: Psychiatry

## 2017-02-23 ENCOUNTER — Encounter (HOSPITAL_COMMUNITY): Payer: Self-pay

## 2017-02-23 ENCOUNTER — Other Ambulatory Visit: Payer: Self-pay

## 2017-02-23 ENCOUNTER — Emergency Department (HOSPITAL_COMMUNITY)
Admission: EM | Admit: 2017-02-23 | Discharge: 2017-02-23 | Disposition: A | Discharge status: Other Acute Inpatient Hospital | Payer: Self-pay | Attending: Physician Assistant | Admitting: Physician Assistant

## 2017-02-23 ENCOUNTER — Encounter (HOSPITAL_COMMUNITY): Payer: Self-pay | Admitting: *Deleted

## 2017-02-23 DIAGNOSIS — F419 Anxiety disorder, unspecified: Secondary | ICD-10-CM | POA: Diagnosis not present

## 2017-02-23 DIAGNOSIS — F332 Major depressive disorder, recurrent severe without psychotic features: Secondary | ICD-10-CM | POA: Diagnosis present

## 2017-02-23 DIAGNOSIS — F329 Major depressive disorder, single episode, unspecified: Secondary | ICD-10-CM | POA: Insufficient documentation

## 2017-02-23 DIAGNOSIS — I110 Hypertensive heart disease with heart failure: Secondary | ICD-10-CM | POA: Diagnosis present

## 2017-02-23 DIAGNOSIS — E785 Hyperlipidemia, unspecified: Secondary | ICD-10-CM | POA: Diagnosis present

## 2017-02-23 DIAGNOSIS — F1410 Cocaine abuse, uncomplicated: Secondary | ICD-10-CM | POA: Insufficient documentation

## 2017-02-23 DIAGNOSIS — E1165 Type 2 diabetes mellitus with hyperglycemia: Secondary | ICD-10-CM | POA: Insufficient documentation

## 2017-02-23 DIAGNOSIS — G4700 Insomnia, unspecified: Secondary | ICD-10-CM | POA: Diagnosis present

## 2017-02-23 DIAGNOSIS — R109 Unspecified abdominal pain: Secondary | ICD-10-CM | POA: Insufficient documentation

## 2017-02-23 DIAGNOSIS — I5022 Chronic systolic (congestive) heart failure: Secondary | ICD-10-CM | POA: Diagnosis present

## 2017-02-23 DIAGNOSIS — Z833 Family history of diabetes mellitus: Secondary | ICD-10-CM | POA: Diagnosis not present

## 2017-02-23 DIAGNOSIS — F17210 Nicotine dependence, cigarettes, uncomplicated: Secondary | ICD-10-CM | POA: Insufficient documentation

## 2017-02-23 DIAGNOSIS — Z818 Family history of other mental and behavioral disorders: Secondary | ICD-10-CM

## 2017-02-23 DIAGNOSIS — E119 Type 2 diabetes mellitus without complications: Secondary | ICD-10-CM | POA: Diagnosis present

## 2017-02-23 DIAGNOSIS — Z8249 Family history of ischemic heart disease and other diseases of the circulatory system: Secondary | ICD-10-CM | POA: Diagnosis not present

## 2017-02-23 DIAGNOSIS — I5042 Chronic combined systolic (congestive) and diastolic (congestive) heart failure: Secondary | ICD-10-CM | POA: Insufficient documentation

## 2017-02-23 DIAGNOSIS — Z79899 Other long term (current) drug therapy: Secondary | ICD-10-CM | POA: Insufficient documentation

## 2017-02-23 DIAGNOSIS — Z794 Long term (current) use of insulin: Secondary | ICD-10-CM | POA: Insufficient documentation

## 2017-02-23 DIAGNOSIS — R739 Hyperglycemia, unspecified: Secondary | ICD-10-CM

## 2017-02-23 DIAGNOSIS — Z813 Family history of other psychoactive substance abuse and dependence: Secondary | ICD-10-CM | POA: Diagnosis not present

## 2017-02-23 DIAGNOSIS — R45851 Suicidal ideations: Secondary | ICD-10-CM | POA: Insufficient documentation

## 2017-02-23 LAB — URINALYSIS, ROUTINE W REFLEX MICROSCOPIC
Bacteria, UA: NONE SEEN
Bilirubin Urine: NEGATIVE
HGB URINE DIPSTICK: NEGATIVE
Ketones, ur: NEGATIVE mg/dL
Leukocytes, UA: NEGATIVE
Nitrite: NEGATIVE
PH: 5 (ref 5.0–8.0)
Protein, ur: NEGATIVE mg/dL
RBC / HPF: NONE SEEN RBC/hpf (ref 0–5)
SPECIFIC GRAVITY, URINE: 1.028 (ref 1.005–1.030)
Squamous Epithelial / LPF: NONE SEEN

## 2017-02-23 LAB — CBG MONITORING, ED
GLUCOSE-CAPILLARY: 285 mg/dL — AB (ref 65–99)
Glucose-Capillary: 402 mg/dL — ABNORMAL HIGH (ref 65–99)
Glucose-Capillary: 316 mg/dL — ABNORMAL HIGH (ref 65–99)
Glucose-Capillary: 283 mg/dL — ABNORMAL HIGH (ref 65–99)

## 2017-02-23 LAB — CBC WITH DIFFERENTIAL/PLATELET
BASOS ABS: 0 10*3/uL (ref 0.0–0.1)
BASOS PCT: 1 %
EOS ABS: 0.2 10*3/uL (ref 0.0–0.7)
EOS PCT: 3 %
HCT: 38.9 % — ABNORMAL LOW (ref 39.0–52.0)
Hemoglobin: 13.8 g/dL (ref 13.0–17.0)
Lymphocytes Relative: 44 %
Lymphs Abs: 1.9 10*3/uL (ref 0.7–4.0)
MCH: 29.6 pg (ref 26.0–34.0)
MCHC: 35.5 g/dL (ref 30.0–36.0)
MCV: 83.5 fL (ref 78.0–100.0)
MONO ABS: 0.3 10*3/uL (ref 0.1–1.0)
MONOS PCT: 7 %
NEUTROS ABS: 2 10*3/uL (ref 1.7–7.7)
Neutrophils Relative %: 45 %
PLATELETS: 204 10*3/uL (ref 150–400)
RBC: 4.66 MIL/uL (ref 4.22–5.81)
RDW: 13.1 % (ref 11.5–15.5)
WBC: 4.4 10*3/uL (ref 4.0–10.5)

## 2017-02-23 LAB — COMPREHENSIVE METABOLIC PANEL
ALT: 28 U/L (ref 17–63)
ANION GAP: 9 (ref 5–15)
AST: 23 U/L (ref 15–41)
Albumin: 3.9 g/dL (ref 3.5–5.0)
Alkaline Phosphatase: 86 U/L (ref 38–126)
BUN: 14 mg/dL (ref 6–20)
CHLORIDE: 92 mmol/L — AB (ref 101–111)
CO2: 28 mmol/L (ref 22–32)
Calcium: 9.2 mg/dL (ref 8.9–10.3)
Creatinine, Ser: 1.18 mg/dL (ref 0.61–1.24)
GFR calc Af Amer: >60 mL/min (ref >60)
GFR calc non Af Amer: >60 mL/min (ref >60)
GLUCOSE: 473 mg/dL — AB (ref 65–99)
POTASSIUM: 4 mmol/L (ref 3.5–5.1)
SODIUM: 129 mmol/L — AB (ref 135–145)
TOTAL PROTEIN: 7 g/dL (ref 6.5–8.1)
Total Bilirubin: 0.7 mg/dL (ref 0.3–1.2)

## 2017-02-23 LAB — ETHANOL

## 2017-02-23 LAB — RAPID URINE DRUG SCREEN, HOSP PERFORMED
AMPHETAMINES: NOT DETECTED
BENZODIAZEPINES: NOT DETECTED
Barbiturates: NOT DETECTED
Cocaine: POSITIVE — AB
OPIATES: NOT DETECTED
TETRAHYDROCANNABINOL: NOT DETECTED

## 2017-02-23 MED ORDER — INSULIN ASPART PROT & ASPART (70-30 MIX) 100 UNIT/ML ~~LOC~~ SUSP
10.0000 [IU] | Freq: Two times a day (BID) | SUBCUTANEOUS | Status: DC
  Administered 2017-02-24 – 2017-02-27 (×7): 10 [IU] via SUBCUTANEOUS

## 2017-02-23 MED ORDER — BUPROPION HCL ER (XL) 150 MG PO TB24
150.0000 mg | ORAL_TABLET | Freq: Every day | ORAL | Status: DC
  Administered 2017-02-24 – 2017-02-27 (×4): 150 mg via ORAL
  Filled 2017-02-23 (×4): qty 1
  Filled 2017-02-23: qty 7
  Filled 2017-02-23: qty 1
  Filled 2017-02-23: qty 7

## 2017-02-23 MED ORDER — CARVEDILOL 25 MG PO TABS
25.0000 mg | ORAL_TABLET | Freq: Two times a day (BID) | ORAL | Status: DC
  Administered 2017-02-25 – 2017-02-27 (×5): 25 mg via ORAL
  Filled 2017-02-23 (×11): qty 1

## 2017-02-23 MED ORDER — INSULIN GLARGINE 100 UNIT/ML ~~LOC~~ SOLN
15.0000 [IU] | Freq: Every day | SUBCUTANEOUS | Status: DC
  Administered 2017-02-24 – 2017-02-26 (×2): 15 [IU] via SUBCUTANEOUS

## 2017-02-23 MED ORDER — ALUM & MAG HYDROXIDE-SIMETH 200-200-20 MG/5ML PO SUSP: 30.0000 mL | ORAL | Status: DC | PRN

## 2017-02-23 MED ORDER — FUROSEMIDE 20 MG PO TABS
20.0000 mg | ORAL_TABLET | Freq: Every day | ORAL | Status: DC
  Administered 2017-02-25 – 2017-02-27 (×3): 20 mg via ORAL
  Filled 2017-02-23 (×7): qty 1

## 2017-02-23 MED ORDER — ACETAMINOPHEN 325 MG PO TABS: 650.0000 mg | ORAL_TABLET | Freq: Four times a day (QID) | ORAL | Status: DC | PRN

## 2017-02-23 MED ORDER — INSULIN ASPART PROT & ASPART (70-30 MIX) 100 UNIT/ML ~~LOC~~ SUSP
10.0000 [IU] | Freq: Two times a day (BID) | SUBCUTANEOUS | Status: DC
  Administered 2017-02-23 (×2): 10 [IU] via SUBCUTANEOUS
  Filled 2017-02-23: qty 10

## 2017-02-23 MED ORDER — TRAZODONE HCL 50 MG PO TABS
50.0000 mg | ORAL_TABLET | Freq: Every evening | ORAL | Status: DC | PRN
Start: 2017-02-23 — End: 2017-02-23

## 2017-02-23 MED ORDER — CARVEDILOL 25 MG PO TABS
25.0000 mg | ORAL_TABLET | Freq: Two times a day (BID) | ORAL | Status: DC
Start: 2017-02-23 — End: 2017-02-23
  Administered 2017-02-23 (×2): 25 mg via ORAL
  Filled 2017-02-23 (×4): qty 1

## 2017-02-23 MED ORDER — METFORMIN HCL 500 MG PO TABS
1000.0000 mg | ORAL_TABLET | Freq: Two times a day (BID) | ORAL | Status: DC
  Administered 2017-02-23 (×2): 1000 mg via ORAL
  Filled 2017-02-23 (×2): qty 2

## 2017-02-23 MED ORDER — SPIRONOLACTONE 25 MG PO TABS
25.0000 mg | ORAL_TABLET | Freq: Every day | ORAL | Status: DC
  Administered 2017-02-25 – 2017-02-27 (×3): 25 mg via ORAL
  Filled 2017-02-23 (×6): qty 1

## 2017-02-23 MED ORDER — SPIRONOLACTONE 25 MG PO TABS
25.0000 mg | ORAL_TABLET | Freq: Every day | ORAL | Status: DC
  Administered 2017-02-23: 25 mg via ORAL
  Filled 2017-02-23: qty 1

## 2017-02-23 MED ORDER — MAGNESIUM HYDROXIDE 400 MG/5ML PO SUSP: 30.0000 mL | Freq: Every day | ORAL | Status: DC | PRN

## 2017-02-23 MED ORDER — FUROSEMIDE 40 MG PO TABS
20.0000 mg | ORAL_TABLET | Freq: Every day | ORAL | Status: DC
  Administered 2017-02-23: 20 mg via ORAL
  Filled 2017-02-23: qty 1

## 2017-02-23 MED ORDER — BUPROPION HCL ER (XL) 150 MG PO TB24
150.0000 mg | ORAL_TABLET | Freq: Every day | ORAL | Status: DC
  Administered 2017-02-23: 150 mg via ORAL
  Filled 2017-02-23: qty 1

## 2017-02-23 MED ORDER — SODIUM CHLORIDE 0.9 % IV BOLUS (SEPSIS)
1000.0000 mL | Freq: Once | INTRAVENOUS | Status: AC
  Administered 2017-02-23: 1000 mL via INTRAVENOUS

## 2017-02-23 MED ORDER — TRAZODONE HCL 50 MG PO TABS
50.0000 mg | ORAL_TABLET | Freq: Every evening | ORAL | Status: DC | PRN
  Administered 2017-02-23 – 2017-02-26 (×3): 50 mg via ORAL
  Filled 2017-02-23 (×2): qty 1

## 2017-02-23 MED ORDER — ATORVASTATIN CALCIUM 40 MG PO TABS
40.0000 mg | ORAL_TABLET | Freq: Every day | ORAL | Status: DC
  Administered 2017-02-24 – 2017-02-27 (×4): 40 mg via ORAL
  Filled 2017-02-23 (×6): qty 1

## 2017-02-23 MED ORDER — METFORMIN HCL 500 MG PO TABS
1000.0000 mg | ORAL_TABLET | Freq: Two times a day (BID) | ORAL | Status: DC
  Administered 2017-02-24 – 2017-02-27 (×7): 1000 mg via ORAL
  Filled 2017-02-23 (×11): qty 2

## 2017-02-23 MED ORDER — ATORVASTATIN CALCIUM 40 MG PO TABS
40.0000 mg | ORAL_TABLET | Freq: Every day | ORAL | Status: DC
  Administered 2017-02-23: 40 mg via ORAL
  Filled 2017-02-23: qty 1

## 2017-02-23 MED ORDER — INSULIN GLARGINE 100 UNIT/ML ~~LOC~~ SOLN
15.0000 [IU] | Freq: Every day | SUBCUTANEOUS | Status: DC
  Administered 2017-02-23: 15 [IU] via SUBCUTANEOUS
  Filled 2017-02-23: qty 0.15

## 2017-02-23 NOTE — ED Notes (Signed)
Delay in 1700 medication administration due to meal not being present yet.

## 2017-02-23 NOTE — BH Assessment (Signed)
Assessment Note   James GoingJohnnie L [name] is an [age] y.o. male who came to [facility] seeking help for suicidal ideations and cocaine/crack addiciton. Pt states that [facility] for the same but "can't stop using". He states that he thought about "slicing a vein in his neck with a utility knife to end it all". He states that he lives with his wife however she might have left him. He has been with her for 35 years and he states that "she's had enough". Pt was sad, withdrawn and sullen during assessment. He states that he [facility] up [facility] but hasn't been taking his medications "like he should". He states that he went to a couple of NA meetings as well but then relapsed. Pt denies HI but endorses hearing "all kinds of stuff" when he is using crack. He states that he uses $40.00 a day and has not been sleeping. He states that the last time he slept was 3 days ago. Pt is unemployed. He has had suicide attempts and admissions in the past by putting a gun up to his head and running into traffic. Pt denies having access to a gun right now. Pt has poor social supports due to his drug addiction.  Inpatient recommended per Dr. 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 Cellulitis and abscess of foot 09/29/2016  . Chronic systolic heart failure (HCC)   . Depression   . Diabetes mellitus without complication (HCC)   . Essential hypertension   . NICM (nonischemic cardiomyopathy) (HCC) EF 20-25%     Past Surgical History:  Procedure Laterality Date  . FINGER SURGERY      Family History:  Family History  Problem Relation Age of Onset  . Diabetes Other   . Hypertension Other   . Diabetes Mother   . Brain cancer Father   . Mental illness Brother   . Drug abuse Brother   .  Diabetes Brother     Social History:  reports that he has been smoking cigarettes.  He has smoked for the past 23.00 years. he has never used smokeless tobacco. He reports that he uses drugs. Drugs: Cocaine and "Crack" cocaine. He reports that he does not drink alcohol.  Additional Social History:  Alcohol / Drug Use Pain Medications: see PTA meds Prescriptions: see PTA meds Over the Counter: see PTA meds History of alcohol / drug use?: Yes Negative Consequences of Use: Work / Programmer, multimediachool, Copywriter, advertisingersonal relationships, Surveyor, quantityinancial Substance #1 Name of Substance 1: crack cocaine 1 - Age of First Use: been using for over 30 years 1 - Frequency: DAILY  1 - Duration: ongoing  1 - Last Use / Amount: yesterday   CIWA: CIWA-Ar BP: 127/72 Pulse Rate: 96 COWS:    PATIENT STRENGTHS: (choose at least two) Average or above average intelligence Motivation for treatment/growth  Allergies: No Known Allergies  Home Medications:  (Not in a hospital admission)  OB/GYN Status:  No LMP for male patient.  General Assessment Data [facility] Assessment: In system Is this a Tele or Face-to-Face Assessment?: Tele Assessment Is this an Initial Assessment or a Re-assessment for this encounter?: Initial Assessment Marital status: Married Is patient pregnant?: No Pregnancy Status: No Living Arrangements: Spouse/significant other Can pt return to current  living arrangement?: Yes Admission Status: Voluntary Is patient capable of signing voluntary admission?: Yes Referral Source: Self/Family/Friend Insurance type: self pay     Crisis Care Plan Living Arrangements: Spouse/significant other Name of Psychiatrist: none Name of Therapist: [facility]  Education Status Is patient currently in school?: No  Risk to self with the past 6 months Suicidal Ideation: Yes-Currently Present Has patient been a risk to self within the past 6 months prior to admission? :  No Suicidal Intent: Yes-Currently Present Has patient had any suicidal intent within the past 6 months prior to admission? : No Is patient at risk for suicide?: Yes Suicidal Plan?: Yes-Currently Present Has patient had any suicidal plan within the past 6 months prior to admission? : No Specify Current Suicidal Plan: cut throat Access to Means: Yes Specify Access to Suicidal Means: has a "utility knife" What has been your use of drugs/alcohol within the last 12 months?: using crack Previous Attempts/Gestures: Yes How many times?: 1 Triggers for Past Attempts: Other (Comment)(chronic drug use) Intentional Self Injurious Behavior: None Family Suicide History: Unknown Recent stressful life event(s): Other (Comment) Persecutory voices/beliefs?: No Depression: Yes Depression Symptoms: Guilt, Despondent, Feeling worthless/self pity Substance abuse history and/or treatment for substance abuse?: Yes Suicide prevention information given to non-admitted patients: Not applicable  Risk to Others within the past 6 months Homicidal Ideation: No Does patient have any lifetime risk of violence toward others beyond the six months prior to admission? : No Thoughts of Harm to Others: No Current Homicidal Intent: No Current Homicidal Plan: No Access to Homicidal Means: No History of harm to others?: No Assessment of Violence: None Noted Does patient have access to weapons?: No Criminal Charges Pending?: Yes Describe Pending Criminal Charges: Felony larceny  Does patient have a court date: Yes Court Date: 01/18/17 Is patient on probation?: Yes  Psychosis Hallucinations: None noted Delusions: None noted  Mental Status Report Appearance/Hygiene: Unremarkable Eye Contact: Fair Motor Activity: Unremarkable Speech: Logical/coherent Level of Consciousness: Quiet/awake Mood: Depressed Affect: Appropriate to circumstance Anxiety Level: Minimal Thought Processes: Coherent, Relevant Judgement:  Partial Orientation: Person, Place, Time, Situation Obsessive Compulsive Thoughts/Behaviors: None  Cognitive Functioning Concentration: Normal Memory: Recent Intact, Remote Intact IQ: Average Insight: Fair Impulse Control: Fair Appetite: Fair Weight Loss: 0 Weight Gain: 0 Sleep: No Change Vegetative Symptoms: None  ADLScreening [facility] Services) Patient's cognitive ability adequate to safely complete daily activities?: Yes Patient able to express need for assistance with ADLs?: Yes Independently performs ADLs?: No  Prior Inpatient Therapy Prior Inpatient Therapy: Yes Prior Therapy Dates: 2017 Prior Therapy Facilty/Provider(s): [facility] Reason for Treatment: drug use; depression  Prior Outpatient Therapy Prior Outpatient Therapy: No Does patient have an ACCT team?: No Does patient have Intensive In-House Services?  : No Does patient have [facility] services? : No Does patient have P4CC services?: No  ADL Screening (condition at time of admission) Patient's cognitive ability adequate to safely complete daily activities?: Yes Is the patient deaf or have difficulty hearing?: No Does the patient have difficulty seeing, even when wearing glasses/contacts?: No Does the patient have difficulty concentrating, remembering, or making decisions?: No Patient able to express need for assistance with ADLs?: Yes Does the patient have difficulty dressing or bathing?: No Independently performs ADLs?: No Does the patient have difficulty walking or climbing stairs?: No Weakness of Legs: None Weakness of Arms/Hands: None  Home Assistive Devices/Equipment Home Assistive Devices/Equipment: None  Therapy Consults (therapy consults require a physician order) PT Evaluation Needed: No  OT Evalulation Needed: No SLP Evaluation Needed: No Abuse/Neglect Assessment (Assessment to be complete while patient is alone) Abuse/Neglect Assessment Can Be Completed: Yes Physical Abuse:  Denies Verbal Abuse: Denies Sexual Abuse: Denies Exploitation of patient/patient's resources: Denies Self-Neglect: Denies   Consults Spiritual Care Consult Needed: No Social Work Consult Needed: No Merchant navy officer (For Healthcare) Does Patient Have a Medical Advance Directive?: No Would patient like information on creating a medical advance directive?: No - Patient declined    Additional Information 1:1 In Past 12 Months?: No CIRT Risk: No Elopement Risk: No Does patient have medical clearance?: Yes     Disposition:  Disposition Initial Assessment Completed for this Encounter: Yes Disposition of Patient: Inpatient treatment program Type of inpatient treatment program: Adult  Jarrett Ables [facility], LCAS  02/23/2017 11:03 AM

## 2017-02-23 NOTE — ED Triage Notes (Signed)
Arrived to [facility], [facility] reports pt found in [place] parking lot very cold. Currently complaining of left flank pain. PT CBG read high, pt has a hx of diabetes takes metformin . Pt reports has been using crack for the past two weeks. SI started yesterday, pt states he looked for his razor today to "slit the vein in his neck".

## 2017-02-23 NOTE — Progress Notes (Signed)
James [name] is a 56 year old male pt admitted on voluntary basis. On admission, [name] reports that after he was discharged last month, he immediately relapsed. He does endorse passive SI on admission but able to contract for safety while in the hospital. He reports that he is not taking his medications as he should and reports that his wife is fed up with him and his substance abuse. He reports that he wants to get back on his medications while here and try to figure out what he keeps doing wrong. He reports that he is hopeful that he can go back home to his wife after discharge. [name] was oriented to the unit and safety maintained.

## 2017-02-23 NOTE — ED Provider Notes (Signed)
[facility]-EMERGENCY DEPT Provider Note   CSN: [id number] Arrival date & time: 02/23/17  4098     History   Chief Complaint No chief complaint on file.   HPI James [name] is a 56 y.o. male.  The history is provided by the patient and medical records. No language interpreter was used.   James [name] is a 56 y.o. male  with a PMH of DM, HTN, CHF who presents to the Emergency Department for suicidal thoughts. Patient states that he wants to kill himself because of his crack cocaine addiction. He notes that his grandchild's first birthday was last week and he decided to skip it in order to get high. He now feels great remorse for skipping. He states that if he cannot get off of crack he would rather just die. He notes that he wanted to cut his neck with his utility knife, but could not find it. Denies HI or auditory / visual hallucinations.   Additionally, patient reports that he has not been taking care of himself due to his addiction. He has not been monitoring his blood sugar nor taking DM medications as directed. He states his sugars have been running "in the high hundreds" for quite some time now, but unable to provide further details about DM regimen and glucose levels at home.   He also endorses right flank pain which began 3-4 hours prior to ER arrival. Pain was sharp and non-radiating. He states that pain has now completely resolved. No current back pain, flank pain or abdominal pain. Denies urinary symptoms, fever, chills, nausea or vomiting.    Past Medical History:  Diagnosis Date  . Cellulitis and abscess of foot 09/29/2016  . Chronic systolic heart failure (HCC)   . Depression   . Diabetes mellitus without complication (HCC)   . Essential hypertension   . NICM (nonischemic cardiomyopathy) (HCC) EF 20-25%     Patient Active Problem List   Diagnosis Date Noted  . MDD (major depressive disorder) 01/14/2017  . Cellulitis of left foot  09/28/2016  . Cellulitis 09/28/2016  . Cardiomyopathy- suspect NICM but etiology not yet determined 04/27/2016  . Acute combined systolic and diastolic heart failure (HCC) 04/27/2016  . History of major depression 04/27/2016  . Community acquired pneumonia 04/26/2016  . Acute respiratory failure with hypoxia (HCC) 04/26/2016  . Controlled type 2 diabetes mellitus with hyperglycemia (HCC) 04/26/2016  . Uncontrolled type 2 diabetes mellitus with hyperglycemia (HCC) 04/26/2016  . Alcohol use disorder, mild, abuse 12/12/2015  . MDD (major depressive disorder), recurrent severe, without psychosis (HCC) 06/26/2015  . History of cocaine abuse 06/25/2015  . SPONDYLOSIS, CERVICAL, WITH RADICULOPATHY 02/25/2009  . MUSCLE SPASM, BACK 02/12/2009  . LEUKOPENIA, MILD 08/19/2008  . ECZEMA, HANDS 08/06/2008  . HYPERCHOLESTEROLEMIA 05/09/2007  . SINUSITIS, CHRONIC 05/01/2007  . ALLERGIC RHINITIS 12/21/2006  . Essential hypertension 12/07/2006    Past Surgical History:  Procedure Laterality Date  . FINGER SURGERY         Home Medications    Prior to Admission medications   Medication Sig Start Date End Date Taking? Authorizing Provider  atorvastatin (LIPITOR) 40 MG tablet Take 1 tablet (40 mg total) by mouth daily. For high cholesterol 01/18/17  Yes Nwoko, Nicole Kindred I, NP  buPROPion (WELLBUTRIN XL) 150 MG 24 hr tablet Take 1 tablet (150 mg total) by mouth daily. For depression 01/19/17  Yes Nwoko, Nicole Kindred I, NP  carvedilol (COREG) 25 MG tablet Take 1 tablet (25 mg total) by  mouth 2 (two) times daily with a meal. 01/18/17  Yes Nwoko, Agnes I, NP  furosemide (LASIX) 20 MG tablet Take 1 tablet (20 mg total) by mouth daily. For swellings 01/18/17  Yes Nwoko, Nicole Kindred I, NP  insulin aspart protamine- aspart (NOVOLOG MIX 70/30) (70-30) 100 UNIT/ML injection Inject 0.1 mLs (10 Units total) into the skin 2 (two) times daily with a meal. For diabetes management 01/18/17  Yes Armandina Stammer I, NP  insulin glargine  (LANTUS) 100 UNIT/ML injection Inject 0.15 mLs (15 Units total) into the skin at bedtime. For diabetes management 01/18/17  Yes Armandina Stammer I, NP  metFORMIN (GLUCOPHAGE) 1000 MG tablet Take 1 tablet (1,000 mg total) by mouth 2 (two) times daily with a meal. For diabetes management 01/18/17  Yes Armandina Stammer I, NP  spironolactone (ALDACTONE) 25 MG tablet Take 1 tablet (25 mg total) by mouth daily. For high blood pressure 01/19/17  Yes Nwoko, Agnes I, NP  traZODone (DESYREL) 50 MG tablet Take 1 tablet (50 mg total) by mouth at bedtime as needed for sleep. 01/18/17  Yes Armandina Stammer I, NP  Insulin Syringe-Needle U-100 (INSULIN SYRINGE .3CC/31GX5/16") 31G X 5/16" 0.3 ML MISC Use with lantus, once daily: For diabetes management 01/18/17   Sanjuana Kava, NP    Family History Family History  Problem Relation Age of Onset  . Diabetes Other   . Hypertension Other   . Diabetes Mother   . Brain cancer Father   . Mental illness Brother   . Drug abuse Brother   . Diabetes Brother     Social History Social History   Tobacco Use  . Smoking status: Current Some Day Smoker    Years: 23.00    Types: Cigarettes  . Smokeless tobacco: Never Used  . Tobacco comment: Every other day   Substance Use Topics  . Alcohol use: No  . Drug use: Yes    Types: Cocaine, "Crack" cocaine    Comment: Every day a couple of times a day for the past two weeks      Allergies   Patient has no known allergies.   Review of Systems Review of Systems  Genitourinary: Positive for flank pain (Resolved).  Psychiatric/Behavioral: Positive for suicidal ideas.  All other systems reviewed and are negative.    Physical Exam Updated Vital Signs BP 127/72   Pulse 96   Resp 15   SpO2 100%   Physical Exam  Constitutional: He is oriented to person, place, and time. He appears well-developed and well-nourished. No distress.  HENT:  Head: Normocephalic and atraumatic.  Cardiovascular: Normal rate, regular rhythm and  normal heart sounds.  No murmur heard. Pulmonary/Chest: Effort normal and breath sounds normal. No respiratory distress.  Abdominal: Soft. He exhibits no distension. There is no tenderness.  No abdominal, flank or CVA tenderness.  Musculoskeletal:  No midline tenderness.  Neurological: He is alert and oriented to person, place, and time.  Skin: Skin is warm and dry.  Nursing note and vitals reviewed.    ED Treatments / Results  Labs (all labs ordered are listed, but only abnormal results are displayed) Labs Reviewed  CBC WITH DIFFERENTIAL/PLATELET - Abnormal; Notable for the following components:      Result Value   HCT 38.9 (*)    All other components within normal limits  COMPREHENSIVE METABOLIC PANEL - Abnormal; Notable for the following components:   Sodium 129 (*)    Chloride 92 (*)    Glucose, Bld 473 (*)  All other components within normal limits  RAPID URINE DRUG SCREEN, HOSP PERFORMED - Abnormal; Notable for the following components:   Cocaine POSITIVE (*)    All other components within normal limits  URINALYSIS, ROUTINE W REFLEX MICROSCOPIC - Abnormal; Notable for the following components:   Color, Urine STRAW (*)    Glucose, UA >=500 (*)    All other components within normal limits  CBG MONITORING, ED - Abnormal; Notable for the following components:   Glucose-Capillary 402 (*)    All other components within normal limits  ETHANOL    EKG  EKG Interpretation None       Radiology No results found.  Procedures Procedures (including critical care time)  Medications Ordered in ED Medications  atorvastatin (LIPITOR) tablet 40 mg (40 mg Oral Given 02/23/17 1049)  buPROPion (WELLBUTRIN XL) 24 hr tablet 150 mg (150 mg Oral Given 02/23/17 0903)  carvedilol (COREG) tablet 25 mg (25 mg Oral Given 02/23/17 1046)  furosemide (LASIX) tablet 20 mg (20 mg Oral Given 02/23/17 1047)  insulin aspart protamine- aspart (NOVOLOG MIX 70/30) injection 10 Units (10 Units  Subcutaneous Given 02/23/17 1042)  insulin glargine (LANTUS) injection 15 Units (not administered)  metFORMIN (GLUCOPHAGE) tablet 1,000 mg (1,000 mg Oral Given 02/23/17 0903)  spironolactone (ALDACTONE) tablet 25 mg (25 mg Oral Given 02/23/17 1046)  traZODone (DESYREL) tablet 50 mg (not administered)  sodium chloride 0.9 % bolus 1,000 mL (0 mLs Intravenous Stopped 02/23/17 0909)     Initial Impression / Assessment and Plan / ED Course  I have reviewed the triage vital signs and the nursing notes.  Pertinent labs & imaging results that were available during my care of the patient were reviewed by me and considered in my medical decision making (see chart for details).    Devin GoingJohnnie L Perrelli is a 56 y.o. male who presents to ED for multiple complaints:  1. Right-sided flank pain which occurred a few hours prior to arrival. Upon my examination, patient states that flank pain has now resolved. No abdominal, CVA, flank or back tenderness. Abdominal labs reassuring. Repeat exam unchanged.   2. Hyperglycemia. Hx of DM and not taking medications as directed. Glucose of 473. Normal Co2 and AG. No ketones in the urine. Doubt DKA. Started back on home medications and placed on diabetic diet during hospital stay.   3. Suicidal thoughts and cocaine abuse. Thought about cutting his neck with a knife today. UDS + for cocaine. Medically cleared with disposition per TTS.    Final Clinical Impressions(s) / ED Diagnoses   Final diagnoses:  Suicidal thoughts  Hyperglycemia    ED Discharge Orders    None       Sofi Bryars, Chase PicketJaime Pilcher, PA-C 02/23/17 1109    Abelino DerrickMackuen, Courteney Lyn, MD 03/03/17 2324

## 2017-02-23 NOTE — BH Assessment (Signed)
[facility] Assessment Progress Note  Per Thedore Mins, MD, this pt requires psychiatric hospitalization at this time.  Malva Limes, RN, [facility] has pre-assigned pt to [facility] Rm 303-1; pt may be transferred after 20:00, and once his CBG is better regulated.  Pt has signed Voluntary Admission and Consent for Treatment, as well as Consent to Release Information to [facility] [place], and a notification call has been placed.  Signed forms have been faxed to [facility].  Pt's nurse has been notified, and agrees to send original paperwork along with pt via Juel Burrow, and to call report to [phone] when the time comes.  Doylene Canning, MA Triage Specialist [phone]

## 2017-02-23 NOTE — Progress Notes (Signed)
Inpatient Diabetes Program Recommendations  AACE/ADA: New Consensus Statement on Inpatient Glycemic Control (2015)  Target Ranges:  Prepandial:   less than 140 mg/dL      Peak postprandial:   less than 180 mg/dL (1-2 hours)      Critically ill patients:  140 - 180 mg/dL   Results for TACARI, CLOUTIER (MRN [id number]) as of 02/23/2017 13:04  Ref. Range 02/23/2017 08:17 02/23/2017 12:18  Glucose-Capillary Latest Ref Range: 65 - 99 mg/dL 334 (H) 356 (H)    Home DM Meds: Lantus 15 units QHS        70/30 Insulin- 10 units BID        Metformin 1000 mg BID  Current Orders: Lantus 15 units QHS      70/30 Insulin- 10 units BID       Metformin 1000 mg BID      MD- Please place orders for Novolog Sensitive Correction Scale/ SSI (0-9 units) TID AC + HS  (Use Glycemic Control Order set)      --Will follow patient during hospitalization--  Ambrose Finland RN, MSN, CDE Diabetes Coordinator Inpatient Glycemic Control Team Team Pager: [phone] (8a-5p)

## 2017-02-23 NOTE — Tx Team (Signed)
Initial Treatment Plan 02/23/2017 11:54 PM BANNON MIDYETTE HER:[id number]    PATIENT STRESSORS: Marital or family conflict Medication change or noncompliance Substance abuse   PATIENT STRENGTHS: Ability for insight Average or above average intelligence Capable of independent living General fund of knowledge Motivation for treatment/growth   PATIENT IDENTIFIED PROBLEMS: Substance Abuse Depression Suicidal thoughts "Try to figure out what I keep doing wrong"                     DISCHARGE CRITERIA:  Ability to meet basic life and health needs Improved stabilization in mood, thinking, and/or behavior Verbal commitment to aftercare and medication compliance Withdrawal symptoms are absent or subacute and managed without 24-hour nursing intervention  PRELIMINARY DISCHARGE PLAN: Attend aftercare/continuing care group Return to previous living arrangement  PATIENT/FAMILY INVOLVEMENT: This treatment plan has been presented to and reviewed with the patient, HAZLE ROSENDAHL, and/or family member, .  The patient and family have been given the opportunity to ask questions and make suggestions.  Genisis Sonnier, [place], [place] 02/23/2017, 11:54 PM

## 2017-02-24 DIAGNOSIS — F17210 Nicotine dependence, cigarettes, uncomplicated: Secondary | ICD-10-CM

## 2017-02-24 DIAGNOSIS — F419 Anxiety disorder, unspecified: Secondary | ICD-10-CM

## 2017-02-24 DIAGNOSIS — F1410 Cocaine abuse, uncomplicated: Secondary | ICD-10-CM

## 2017-02-24 DIAGNOSIS — F332 Major depressive disorder, recurrent severe without psychotic features: Principal | ICD-10-CM

## 2017-02-24 DIAGNOSIS — Z813 Family history of other psychoactive substance abuse and dependence: Secondary | ICD-10-CM

## 2017-02-24 DIAGNOSIS — Z818 Family history of other mental and behavioral disorders: Secondary | ICD-10-CM

## 2017-02-24 LAB — GLUCOSE, CAPILLARY
GLUCOSE-CAPILLARY: 70 mg/dL (ref 65–99)
Glucose-Capillary: 254 mg/dL — ABNORMAL HIGH (ref 65–99)
Glucose-Capillary: 434 mg/dL — ABNORMAL HIGH (ref 65–99)
Glucose-Capillary: 361 mg/dL — ABNORMAL HIGH (ref 65–99)
Glucose-Capillary: 101 mg/dL — ABNORMAL HIGH (ref 65–99)
Glucose-Capillary: 60 mg/dL — ABNORMAL LOW (ref 65–99)

## 2017-02-24 MED ORDER — INSULIN ASPART 100 UNIT/ML ~~LOC~~ SOLN
0.0000 [IU] | Freq: Three times a day (TID) | SUBCUTANEOUS | Status: DC
  Administered 2017-02-25: 7 [IU] via SUBCUTANEOUS
  Administered 2017-02-25: 1 [IU] via SUBCUTANEOUS
  Administered 2017-02-26: 7 [IU] via SUBCUTANEOUS
  Administered 2017-02-26 – 2017-02-27 (×2): 2 [IU] via SUBCUTANEOUS

## 2017-02-24 MED ORDER — INSULIN ASPART 100 UNIT/ML ~~LOC~~ SOLN
3.0000 [IU] | Freq: Three times a day (TID) | SUBCUTANEOUS | Status: DC
  Administered 2017-02-25 – 2017-02-27 (×5): 3 [IU] via SUBCUTANEOUS

## 2017-02-24 MED ORDER — INSULIN ASPART 100 UNIT/ML ~~LOC~~ SOLN
3.0000 [IU] | Freq: Once | SUBCUTANEOUS | Status: AC
  Administered 2017-02-24: 3 [IU] via SUBCUTANEOUS

## 2017-02-24 NOTE — Progress Notes (Signed)
D: Patient observed resting in bed this AM Requested x 2 for patient to come up for AM meds. Patient did eventually comply. Patient states he is tired today and is "catching up on my sleep." Chart does indicate he slept through the night. Patient's affect flat, mood depressed. Per self inventory and discussions with writer, rates depression at an 8/10, hopelessness at an 8/10 and anxiety at a 6/10. Rates sleep as fair, appetite as fair, energy as normal and concentration as good.  States goal for today is to "stay clean." Denies pain, physical problems. BP low however patient is asymptomatic.  A: Medicated per orders, no prns requested or required. Antihypertensives held. Level III obs in place for safety. Emotional support offered and self inventory reviewed. Encouraged completion of Suicide Safety Plan and programming participation. Discussed POC with MD, SW. Will continue to monitor BP.  R: Patient verbalizes understanding of POC. Patient denies SI/HI/AVH and remains safe on level III obs. Will continue to monitor closely and make verbal contact frequently.

## 2017-02-24 NOTE — Progress Notes (Signed)
Dinner time CBG was "434." Patient admits to eating cookies at lunch. A Nwoko, NP notified and orders received. 3 units coverage given along with scheduled 10 units 70/30. Patient reminded of healthy diet choices and disease education given. Patient verbalizes understanding. On recheck after dinner, CBG is "361."

## 2017-02-24 NOTE — H&P (Signed)
Psychiatric Admission Assessment Adult  Patient Identification: James [name]  MRN:  [id number]  Date of Evaluation:  02/24/2017  Chief Complaint: Worsening symptoms of depression triggering suicidal ideations.  Principal Diagnosis: MDD (major depressive disorder), recurrent severe, without psychosis ([place])  Diagnosis:   Patient Active Problem List   Diagnosis Date Noted  . MDD (major depressive disorder), recurrent severe, without psychosis ([place]) [F33.2] 06/26/2015    Priority: High  . MDD (major depressive disorder) [F32.9] 01/14/2017  . Cellulitis of left foot [L03.116] 09/28/2016  . Cellulitis [L03.90] 09/28/2016  . Cardiomyopathy- suspect NICM but etiology not yet determined [I42.9] 04/27/2016  . Acute combined systolic and diastolic heart failure ([place]) [I50.41] 04/27/2016  . History of major depression [Z86.59] 04/27/2016  . Community acquired pneumonia [J18.9] 04/26/2016  . Acute respiratory failure with hypoxia ([place]) [J96.01] 04/26/2016  . Controlled type 2 diabetes mellitus with hyperglycemia ([place]) [E11.65] 04/26/2016  . Uncontrolled type 2 diabetes mellitus with hyperglycemia ([place]) [E11.65] 04/26/2016  . 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 History of cocaine abuse [Z87.898] 06/25/2015  . 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 MUSCLE SPASM, BACK [M53.80] 02/12/2009  . 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 HYPERCHOLESTEROLEMIA [E78.00] 05/09/2007  . SINUSITIS, CHRONIC [J32.9] 05/01/2007  . ALLERGIC RHINITIS [J30.9] 12/21/2006  . Essential hypertension [I10] 12/07/2006   History of Present Illness: This is one of the numerous admission assessments in this hospital alone for this 56 year old AA male with hx of Cocaine use disorder. James [name] was discharged from this hospital last month with a referral & an appointment for an outpatient psychiatric services. His UDS was positive for  Cocaine. During this assessment, James [name] reports, "I went to the hospital yesterday morning by an ambulance. I was hanging out at a gas station when someone saw me & called the ambulance. It was raining & I could not go home because I was feeling really guilty & disappointed in myself. Last Saturday was my grand-daughter's birthday. Instead of being there to celebrate her birthday, I chose to go get get high on cocaine. I have been very angry at myself. When I got discharged from this hospital last month, I relapsed within 2 weeks. I have not been really taking my depression medicines like I'm suppose to. While I was out there in the rain yesterday, I became very suicidal. I was gonna cut myself but, I could not find my razor. This time, I mean business, I will need to go a long term substance abuse treatment center".  Associated Signs/Symptoms:  Depression Symptoms:  depressed mood, feelings of worthlessness/guilt, hopelessness, suicidal thoughts without plan, anxiety,  (Hypo) Manic Symptoms:  Impulsivity, Irritable Mood,  Anxiety Symptoms:  Excessive Worry,  Psychotic Symptoms:  Currently denies any hallucinations, delusional thoughts or paranoia  PTSD Symptoms: Negative  Total Time spent with patient: 1 hour  Past Psychiatric History: He has one previous psychiatric admission about two years ago for substance induced depression and suicide ideations.  Is the patient at risk to self? No.  Has the patient been a risk to self in the past 6 months? Yes.    Has the patient been a risk to self within the distant past? Yes.    Is the patient a risk to others? No.  Has the patient been a risk to others in the past 6 months? No.  Has the patient been a risk to others within the distant past? No.   Prior  Inpatient Therapy: Yes, [facility] x multiple times. Prior Outpatient Therapy: Yes  Alcohol Screening: 1. How often do you have a drink containing alcohol?: 2 to 4 times a month 2. How many  drinks containing alcohol do you have on a typical day when you are drinking?: 1 or 2 3. How often do you have six or more drinks on one occasion?: Never AUDIT-C Score: 2 4. How often during the last year have you found that you were not able to stop drinking once you had started?: Never 5. How often during the last year have you failed to do what was normally expected from you becasue of drinking?: Never 6. How often during the last year have you needed a first drink in the morning to get yourself going after a heavy drinking session?: Never 7. How often during the last year have you had a feeling of guilt of remorse after drinking?: Never 8. How often during the last year have you been unable to remember what happened the night before because you had been drinking?: Never 9. Have you or someone else been injured as a result of your drinking?: No 10. Has a relative or friend or a doctor or another health worker been concerned about your drinking or suggested you cut down?: No Alcohol Use Disorder Identification Test Final Score (AUDIT): 2 Intervention/Follow-up: AUDIT Score <7 follow-up not indicated  Substance Abuse History in the last 12 months:  Yes.    Consequences of Substance Abuse: Medical Consequences:  Liver damage, Possible death by overdose Legal Consequences:  Arrests, jail time, Loss of driving privilege. Family Consequences:  Family discord, divorce and or separation.  Previous Psychotropic Medications: Yes   Psychological Evaluations: Yes   Past Medical History:  Past Medical History:  Diagnosis Date  . Cellulitis and abscess of foot 09/29/2016  . Chronic systolic heart failure ([place])   . Depression   . Diabetes mellitus without complication ([place])   . Essential hypertension   . NICM (nonischemic cardiomyopathy) ([place]) EF 20-25%     Past Surgical History:  Procedure Laterality Date  . CARDIAC CATHETERIZATION N/A 04/30/2016   Procedure: Right/Left Heart Cath and  Coronary Angiography;  Surgeon: Nelva Bush, MD;  Location: [facility] [place] [facility];  Service: Cardiovascular;  Laterality: N/A;  . FINGER SURGERY     Family History:  Family History  Problem Relation Age of Onset  . Diabetes Other   . Hypertension Other   . Diabetes Mother   . Brain cancer Father   . Mental illness Brother   . Drug abuse Brother   . Diabetes Brother    Family Psychiatric  History: None reported.   Tobacco Screening: Have you used any form of tobacco in the last 30 days? (Cigarettes, Smokeless Tobacco, Cigars, and/or Pipes): Yes Tobacco use, Select all that apply: 5 or more cigarettes per day Are you interested in Tobacco Cessation Medications?: No, patient refused Counseled patient on smoking cessation including recognizing danger situations, developing coping skills and basic information about quitting provided: Refused/Declined practical counseling  Social History: Married and has 3 grown children and works for [employer] off and on. Social History   Substance and Sexual Activity  Alcohol Use No     Social History   Substance and Sexual Activity  Drug Use Yes  . Types: Cocaine, "Crack" cocaine   Comment: Every day a couple of times a day for the past two weeks     Additional Social History: Lives with his wife, wife  works and does not want to deal with him due to his drug use.   Allergies:  No Known Allergies  Lab Results:  Results for orders placed or performed during the hospital encounter of 02/23/17 (from the past 48 hour(s))  Glucose, capillary     Status: None   Collection Time: 02/24/17  6:37 AM  Result Value Ref Range   Glucose-Capillary 70 65 - 99 mg/dL  Glucose, capillary     Status: Abnormal   Collection Time: 02/24/17 11:47 AM  Result Value Ref Range   Glucose-Capillary 254 (H) 65 - 99 mg/dL   Blood Alcohol level:  Lab Results  Component Value Date   ETH <10 02/23/2017   ETH <10 07/01/2246   Metabolic Disorder Labs:  Lab Results   Component Value Date   HGBA1C 13.1 (H) 09/29/2016   MPG 329 09/29/2016   MPG 332 04/27/2016   Lab Results  Component Value Date   PROLACTIN 10.7 01/16/2017   Lab Results  Component Value Date   CHOL 147 01/16/2017   TRIG 47 01/16/2017   HDL 72 01/16/2017   CHOLHDL 2.0 01/16/2017   VLDL 9 01/16/2017   LDLCALC 66 01/16/2017   LDLCALC 71 04/28/2016   Current Medications: Current Facility-Administered Medications  Medication Dose Route Frequency Provider Last Rate Last Dose  . acetaminophen (TYLENOL) tablet 650 mg  650 mg Oral Q6H PRN Ethelene Hal, NP      . alum & mag hydroxide-simeth (MAALOX/MYLANTA) 200-200-20 MG/5ML suspension 30 mL  30 mL Oral Q4H PRN Ethelene Hal, NP      . atorvastatin (LIPITOR) tablet 40 mg  40 mg Oral Daily Ethelene Hal, NP   40 mg at 02/24/17 0946  . buPROPion (WELLBUTRIN XL) 24 hr tablet 150 mg  150 mg Oral Daily Ethelene Hal, NP   150 mg at 02/24/17 0945  . carvedilol (COREG) tablet 25 mg  25 mg Oral BID WC Ethelene Hal, NP   Stopped at 02/24/17 0945  . furosemide (LASIX) tablet 20 mg  20 mg Oral Daily Ethelene Hal, NP   Stopped at 02/24/17 [phone]  . insulin aspart protamine- aspart (NOVOLOG MIX 70/30) injection 10 Units  10 Units Subcutaneous BID WC Ethelene Hal, NP   10 Units at 02/24/17 [phone]  . insulin glargine (LANTUS) injection 15 Units  15 Units Subcutaneous QHS Ethelene Hal, NP      . magnesium hydroxide (MILK OF MAGNESIA) suspension 30 mL  30 mL Oral Daily PRN Ethelene Hal, NP      . metFORMIN (GLUCOPHAGE) tablet 1,000 mg  1,000 mg Oral BID WC Ethelene Hal, NP   1,000 mg at 02/24/17 0946  . spironolactone (ALDACTONE) tablet 25 mg  25 mg Oral Daily Ethelene Hal, NP   Stopped at 02/24/17 0945  . traZODone (DESYREL) tablet 50 mg  50 mg Oral QHS PRN Ethelene Hal, NP   50 mg at 02/23/17 2330   PTA Medications: Medications Prior to Admission   Medication Sig Dispense Refill Last Dose  . atorvastatin (LIPITOR) 40 MG tablet Take 1 tablet (40 mg total) by mouth daily. For high cholesterol 30 tablet 0 Past Week at Unknown time  . buPROPion (WELLBUTRIN XL) 150 MG 24 hr tablet Take 1 tablet (150 mg total) by mouth daily. For depression 30 tablet 0 Past Month at Unknown time  . carvedilol (COREG) 25 MG tablet Take 1 tablet (25 mg total) by mouth 2 (two)  times daily with a meal. 60 tablet 0 Past Week at Unknown time  . furosemide (LASIX) 20 MG tablet Take 1 tablet (20 mg total) by mouth daily. For swellings 30 tablet 0 Past Week at Unknown time  . insulin aspart protamine- aspart (NOVOLOG MIX 70/30) (70-30) 100 UNIT/ML injection Inject 0.1 mLs (10 Units total) into the skin 2 (two) times daily with a meal. For diabetes management 10 mL 0 Past Week at Unknown time  . insulin glargine (LANTUS) 100 UNIT/ML injection Inject 0.15 mLs (15 Units total) into the skin at bedtime. For diabetes management 10 mL 0 Past Week at Unknown time  . Insulin Syringe-Needle U-100 (INSULIN SYRINGE .3CC/31GX5/16") 31G X 5/16" 0.3 ML MISC Use with lantus, once daily: For diabetes management 100 each 0   . metFORMIN (GLUCOPHAGE) 1000 MG tablet Take 1 tablet (1,000 mg total) by mouth 2 (two) times daily with a meal. For diabetes management 60 tablet 0 Past Week at Unknown time  . spironolactone (ALDACTONE) 25 MG tablet Take 1 tablet (25 mg total) by mouth daily. For high blood pressure 30 tablet 0 Past Week at Unknown time  . traZODone (DESYREL) 50 MG tablet Take 1 tablet (50 mg total) by mouth at bedtime as needed for sleep. 30 tablet 0 Past Month at Unknown time   Musculoskeletal: Strength & Muscle Tone: within normal limits Gait & Station: normal Patient leans: N/A  Psychiatric Specialty Exam: Physical Exam  Patient denies any chest pain, sob or any other distress.    Blood pressure (!) 92/58, pulse 62, temperature 98.3 F (36.8 C), temperature source Oral,  resp. rate 18, height 6' (1.829 m), weight 62.6 kg (138 lb).Body mass index is 18.72 kg/m.  General Appearance: Casual and Fairly Groomed, in hospital scrub  Eye Contact:  Good  Speech:  Clear and Coherent  Volume:  Normal  Mood:  Anxious, Depressed, Hopeless and Worthless  Affect:  Congruent and Flat  Thought Process:  Coherent and Linear  Orientation:  Full (Time, Place, and Person)  Thought Content:  Rumination, denies any hallucinations, delusional thoughts or paranoia.  Suicidal Thoughts:  Currently denies any thoughts, plans or intent.  Homicidal Thoughts:  Denies  Memory:  Immediate;   Good Recent;   Good Remote;   Good  Judgement:  Good  Insight:  Fair  Psychomotor Activity:  Decreased  Concentration:  Concentration: Good and Attention Span: Fair  Recall:  Good  Fund of Knowledge:  Fair  Language:  Good  Akathisia:  Negative  Handed:  Right  AIMS (if indicated):     Assets:  Communication Skills Desire for Improvement Housing Social Support  ADL's:  Intact  Cognition:  WNL  Sleep:  Number of Hours: 6.25   Treatment Plan/Recommendations: 1. Admit for crisis management and stabilization, estimated length of stay 3-5 days.   2. Medication management to reduce current symptoms to base line and improve the patient's overall level of functioning: See MAR, resumed all pertinent home medications for mental health & medical condition.  3. Treat health problems as indicated.  4. Develop treatment plan to decrease risk of relapse upon discharge and the need for readmission.  5. Psycho-social education regarding relapse prevention and self care.  6. Health care follow up as needed for medical problems.  7. Review, reconcile, and reinstate any pertinent home medications for other health issues where appropriate. 8. Call for consults with hospitalist for any additional specialty patient care services as needed.  Observation Level/Precautions:  15 minute  checks  Laboratory: Per  ED  Psychotherapy: Group therapies  Medications: See MAR.  Consultations:  As needed  Discharge Concerns:  Safety   Estimated LOS: 2-4 days  Other: Admit to [address].    Physician Treatment Plan for Primary Diagnosis: Will re-initiate medication management for mood stability. Set up an outpatient psychiatric services for medication management. Will encourage medication adherence with psychiatric medications.  Long Term Goal(s): Improvement in symptoms so as ready for discharge  Short Term Goals: Ability to identify changes in lifestyle to reduce recurrence of condition will improve and Ability to verbalize feelings will improve  Physician Treatment Plan for Secondary Diagnosis: Principal Problem:   MDD (major depressive disorder), recurrent severe, without psychosis ([place])  Long Term Goal(s): Improvement in symptoms so as ready for discharge  Short Term Goals: Ability to identify and develop effective coping behaviors will improve, Compliance with prescribed medications will improve and Ability to identify triggers associated with substance abuse/mental health issues will improve  I certify that inpatient services furnished can reasonably be expected to improve the patient's condition.    Encarnacion Slates, NP, PMHNP, FNP-BC. 11/15/20183:14 PM  I have reviewed NP's Note, assessement, diagnosis and plan, and agree. I have also met with patient and completed suicide risk assessment.  Tonny Isensee is a 56 y/o M with history of MDD and stimulant use disorder (cocaine) who was admitted with worsening depression and SI due to relapse of cocaine use. Pt has recent relevant history of discharge from [facility] in October 2018 to outpatient level of services, but he was not adherent to prescribed treatment regimen, and he relapsed with use of crack cocaine. Pt identifies trigger for his worsened mood as guilty feelings related to missing his granddaughter's birthday last weekend so that he could use  illicit substances. He developed SI with plan to cut his neck with a utility knife, but he was unable to find a knife. He denies HI/AH/VH. He reports anhedonia, guilty feelings, low energy, and poor concentration. He denies symptoms of mania, OCD, and PTSD. He agrees to be restarted on previous medications on which he was discharged last month as he feels there were helpful. He is interested in speaking with SW team about possible substance use treatment referrals. He had no further questions, comments, or concerns.   PLAN OF CARE:  - Admit to inpatient level of care - MDD             - restart Wellbutrin XL 183m qDay - Insomnia             - Restart trazodone 564mqhs prn insomnia - DMII             - Restart  SSI (novolog), metformin, and Lantus - HLD             - Restart lipitor 4074mDay - HTN             - Restart carvedilol 8m66mD - CHF             - Restart lasix 20mg51my - Encourage participation in groups and the therapeutic milieu - Discharge planning will be ongoing    ChrisMaris Berger

## 2017-02-24 NOTE — [facility] Suicide Risk Assessment (Signed)
[facility] Admission Suicide Risk Assessment   Nursing information obtained from:   [facility] Demographic factors:    Current Mental Status:    Loss Factors:    Historical Factors:    Risk Reduction Factors:     Total Time spent with patient: 45 minutes Principal Problem: MDD (major depressive disorder), recurrent severe, without psychosis ([facility]) Diagnosis:   Patient Active Problem List   Diagnosis Date Noted  . MDD (major depressive disorder) [F32.9] 01/14/2017  . Cellulitis of left foot [L03.116] 09/28/2016  . Cellulitis [L03.90] 09/28/2016  . Cardiomyopathy- suspect NICM but etiology not yet determined [I42.9] 04/27/2016  . Acute combined systolic and diastolic heart failure ([facility]) [I50.41] 04/27/2016  . History of major depression [Z86.59] 04/27/2016  . Community acquired pneumonia [J18.9] 04/26/2016  . Acute respiratory failure with hypoxia ([facility]) [J96.01] 04/26/2016  . Controlled type 2 diabetes mellitus with hyperglycemia ([facility]) [E11.65] 04/26/2016  . Uncontrolled type 2 diabetes mellitus with hyperglycemia ([facility]) [E11.65] 04/26/2016  . 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 MDD (major depressive disorder), recurrent severe, without psychosis ([facility]) [F33.2] 06/26/2015  . History of cocaine abuse [Z87.898] 06/25/2015  . 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 MUSCLE SPASM, BACK [M53.80] 02/12/2009  . 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 HYPERCHOLESTEROLEMIA [E78.00] 05/09/2007  . SINUSITIS, CHRONIC [J32.9] 05/01/2007  . ALLERGIC RHINITIS [J30.9] 12/21/2006  . Essential hypertension [I10] 12/07/2006   Subjective Data:  James [name] is a 56 y/o M with history of MDD and stimulant use disorder (cocaine) who was admitted with worsening depression and SI due to relapse of cocaine use. [facility] level [facility] services, but he was not adherent to  prescribed treatment regimen, and he relapsed with use of crack cocaine. Pt identifies trigger for his worsened mood as guilty feelings related to missing his granddaughter's birthday last weekend so that he could use illicit substances. He developed SI with plan to cut his neck with a utility knife, but he was unable to find a knife. He denies HI/AH/VH. He reports anhedonia, guilty feelings, low energy, and poor concentration. He denies symptoms of mania, OCD, and PTSD. He agrees to be restarted on previous medications on which he was discharged last month as he feels there were helpful. He is interested in speaking with SW team about possible substance use treatment referrals. He had no further questions, comments, or concerns.   Continued Clinical Symptoms:  Alcohol Use Disorder Identification Test Final Score (AUDIT): 2 The "Alcohol Use Disorders Identification Test", Guidelines for Use in Primary Care, Second Edition.  World Science writerHealth Organization [facility]WHO). Score between 0-7:  no or low risk or alcohol related problems. Score between 8-15:  moderate risk of alcohol related problems. Score between 16-19:  high risk of alcohol related problems. Score 20 or above:  warrants further diagnostic evaluation for alcohol dependence and treatment.   CLINICAL FACTORS:   Severe Anxiety and/or Agitation Depression:   Comorbid alcohol abuse/dependence Alcohol/Substance Abuse/Dependencies   Musculoskeletal: Strength & Muscle Tone: within normal limits Gait & Station: normal Patient leans: N/A  Psychiatric Specialty Exam: Physical Exam  Nursing note and vitals reviewed.   Review of Systems  Constitutional: Negative for chills and fever.  Respiratory: Negative for cough and shortness of breath.   Cardiovascular: Negative for chest pain.  Gastrointestinal: Negative for abdominal pain, heartburn, nausea and vomiting.  Psychiatric/Behavioral: Positive for depression, substance abuse and suicidal  ideas.     Blood pressure (!) 108/53, pulse 87, temperature 98.3 F (36.8 C), temperature source Oral, resp. rate 18, height 6' (1.829 m), weight 62.6 kg (138 lb).Body mass index is 18.72 kg/m.  General Appearance: Casual and Fairly Groomed  Eye Contact:  Good  Speech:  Clear and Coherent and Normal Rate  Volume:  Normal  Mood:  Anxious and Depressed  Affect:  Congruent and Constricted  Thought Process:  Coherent and Goal Directed  Orientation:  Full (Time, Place, and Person)  Thought Content:  Logical  Suicidal Thoughts:  Yes.  with intent/plan  Homicidal Thoughts:  No  Memory:  Immediate;   Good Recent;   Good Remote;   Good  Judgement:  Impaired  Insight:  Fair  Psychomotor Activity:  Normal  Concentration:  Concentration: Good  Recall:  Good  Fund of Knowledge:  Good  Language:  Fair  Akathisia:  No  Handed:    AIMS (if indicated):     Assets:  Communication Skills Desire for Improvement Physical Health Resilience Social Support  ADL's:  Intact  Cognition:  WNL  Sleep:  Number of Hours: 6.25      COGNITIVE FEATURES THAT CONTRIBUTE TO RISK:  None    SUICIDE RISK:   Mild:  Suicidal ideation of limited frequency, intensity, duration, and specificity.  There are no identifiable plans, no associated intent, mild dysphoria and related symptoms, good self-control (both objective and subjective assessment), few other risk factors, and identifiable protective factors, including available and accessible social support.  PLAN OF CARE:  - Admit to inpatient level of care - MDD  - restart Wellbutrin XL 150mg  qDay - Insomnia  - Restart trazodone 50mg  qhs prn insomnia - DMII  - Restart  SSI (novolog), metformin, and Lantus - HLD  - Restart lipitor 40mg  qDay - HTN  - Restart carvedilol 25mg  BID - CHF  - Restart lasix 20mg  qDay - Encourage participation in groups and the therapeutic milieu - Discharge planning will be ongoing   I certify that inpatient services furnished can  reasonably be expected to improve the patient's condition.   Micheal Likens, MD 02/24/2017, 5:29 PM

## 2017-02-24 NOTE — B[facility] Counselor (Signed)
Adult Comprehensive Assessment  Patient ID: James [name], male   DOB: Dec 31, 1960, 56 y.o.   MRN: [id number]  Information Source: Information source: Patient  Current Stressors:  Educational / Learning stressors: Denies stressors Employment / Job issues:  unemployed Family Relationships: Each time his family starts to trust him, he lets them down again.  Wife thinks this will never stop. On verge of losing her.   He does not see his grandchildren because of his addition.  "Everybody suffers because of my mistakes." Financial / Lack of resources (include bankruptcy): Financial stressors are significant, only wife is working, huge impact on finances from his substance abuse Housing / Lack of housing: Lives with his wife Physical health (include injuries & life threatening diseases): Diabetes which is unmanaged, very bad.  Still problems with hernia.  Open wound on arm currently, very painful. Substance abuse: relapsed on crack after 2 weeks of sobriety.  Bereavement / Loss: Father died approximately 6 years ago, brother many years ago.  Living/Environment/Situation:  Living Arrangements: Spouse/significant other Living conditions (as described by patient or guardian):patient currenlty lives [place] in [place]  How long has patient lived in current situation?: 18 years, adult daughter just moved out 2 months ago  What is atmosphere in current home:   Chaotic due to his substance abuse "I"m not sure if she is gonna leave me."   Family History:  Marital status: Married Number of Years Married: 36 years  What types of issues is patient dealing with in the relationship?: Per patient, his drug use is the major struggle in his relationship. May be getting separated  Additional relationship information: none Does patient have children?: Yes How many children?: 3  How is patient's relationship with their children?: very close, however relationship now strained due to his drug  use  Childhood History:  By whom was/is the patient raised?: Both parents Description of patient's relationship with caregiver when they were a child: very loving, caring and supportive. Per patient, father kept the family life peaceful Patient's description of current relationship with people who raised him/her: patient's father died 5 years ago, mother resides in [place] and has very limited contact with her Does patient have siblings?: Yes Number of Siblings: 5  Description of patient's current relationship with siblings: not as close as they reportedly hold grudges against patient for his past behaviors Did patient suffer any verbal/emotional/physical/sexual abuse as a child?: No Did patient suffer from severe childhood neglect?: No Has patient ever been sexually abused/assaulted/raped as an adolescent or adult?: No Was the patient ever a victim of a crime or a disaster?: No Witnessed domestic violence?: No Has patient been effected by domestic violence as an adult?: No  Education:  Highest grade of school patient has completed: 12 Currently a student?: No Name of school: NA Contact person: NA Learning disability?: No  Employment/Work Situation:  Employment situation: Unemployed Patient's job has been impacted by current illness: no What is the longest time patient has a held a job?: 5 years 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  No insurance Does patient have a representative payee or guardian?: No  Alcohol/Substance Abuse:  What has been your use of drugs/alcohol within the last 12 months?: Crack cocaine daily since he relapsed 2 weeks  ago.  If attempted suicide, did drugs/alcohol play a role in this?: No-substance  abuse Alcohol/Substance Abuse Treatment Hx: Past Tx, Inpatient, Outpatient If yes, describe treatment: [facility] 7 yrs ago, [facility] residential program in [place] several years ago, [facility] detoxes in 2014, [place] in Feb of 2017, [facility]. [facility] October 2018 Has alcohol/substance abuse ever caused legal problems?: Yes- marijuana and cocaine possession charges   Social Support System:  Patient's Community Support System: Poor Describe Community Support System: Wife, self Type of faith/religion: Ephri[employer] Knuckles How does patient's faith help to cope with current illness?: [name] to church is very helpful.  Leisure/Recreation:  Leisure and Hobbies: fishing  Strengths/Needs:  What things does the patient do well?: "I am a decent person" In what areas does patient struggle / problems for patient: drug addiction, self-esteem and guilt  Discharge Plan:  Does patient have access to transportation?: No (Needs help figuring this out) Will patient be returning to same living situation after discharge?: Yes Currently receiving community mental health services: Yes ([facility], last seen 2 weeks ago - only seeing a therapist individually and group) If no, would patient like referral for services when discharged?: Yes (What county?) ([place], no insurance) Does patient have financial barriers related to dischargedications?: Yes Patient description of barriers related to discharge medications: No health insurance, no income  Summary/Recommendations:   Summary and Recommendations (to be completed by the evaluator): Patient is a 56yo male admitted to the hospital seeking treatment for suicidal ideations, crack cocaine relapse, and for medication stabilization. Patient was discharged from [facility] 10/18. He reports that he relapsed a few weeks after discharge and has had marital issues with his wife due to his relapse. Patient would like to return home and pursue SAIOP  through [facility] and resume medication management with [facility]. Pt given residential options including [facility] but is not interested at this time. Recommendations for patient include: crisis stabilization, therapeutic milieu, encourage group attendance and participation, medication management for detox/mood stabilization, and development of comprehensive mental wellness/sobriety plan. CSW continuing to assess.   Ledell Peoples Smart LCSW 02/24/2017 10:50 AM

## 2017-02-24 NOTE — Plan of Care (Signed)
Patient has been med compliant thus far. Patient verbalizes understanding of generalized information regarding [facility].

## 2017-02-24 NOTE — [facility] Suicide Risk Assessment (Signed)
[facility] INPATIENT:  Family/Significant Other Suicide Prevention Education  Suicide Prevention Education:  Contact Attempts: Sier Ricklefs (pt's wife) [phone] has been identified by the patient as the family member/significant other with whom the patient will be residing, and identified as the person(s) who will aid the patient in the event of a mental health crisis.  With written consent from the patient, two attempts were made to provide suicide prevention education, prior to and/or following the patient's discharge.  We were unsuccessful in providing suicide prevention education.  A suicide education pamphlet was given to the patient to share with family/significant other.  Date and time of first attempt: 02/24/17 at 11:40AM Date and time of second attempt: 02/24/17 at 1:15PM (Voicemail left requesting call back at earliest convenience).   Thursa Emme N Smart LCSW 02/24/2017, 1:16 PM

## 2017-02-25 DIAGNOSIS — R450 Nervousness: Secondary | ICD-10-CM

## 2017-02-25 LAB — GLUCOSE, CAPILLARY
GLUCOSE-CAPILLARY: 101 mg/dL — AB (ref 65–99)
GLUCOSE-CAPILLARY: 67 mg/dL (ref 65–99)
GLUCOSE-CAPILLARY: 152 mg/dL — AB (ref 65–99)
Glucose-Capillary: 37 mg/dL — CL (ref 65–99)
Glucose-Capillary: 149 mg/dL — ABNORMAL HIGH (ref 65–99)
Glucose-Capillary: 100 mg/dL — ABNORMAL HIGH (ref 65–99)
Glucose-Capillary: 336 mg/dL — ABNORMAL HIGH (ref 65–99)
Glucose-Capillary: 55 mg/dL — ABNORMAL LOW (ref 65–99)

## 2017-02-25 NOTE — Progress Notes (Signed)
Pt came to the NS sweaty, c/o weakness.  Pt's CBG was checked and found to be low at 37.  Pt was given juice, cookies, pretzels and then 4 oz of ginger ale.  At 0220, his CBG was 101.  Pt stated he felt better and MHT assisted pt back to bed.  The evening provider, Barbara Cower, was informed of the incident and results.  No new orders needed.  Pt remains safe with q15 minute checks.

## 2017-02-25 NOTE — Progress Notes (Signed)
[facility] MD Progress Note  02/25/2017 2:25 PM JAIVIAN GUILLOT  MRN:  [id number]  Subjective: Timarion reports, I'm feeling a little depressed & disappointed in myself for what I'm doing to my family. I feel like I'm letting them down over & over again. I know that I have said this many many times before, but this time, it is different. I cannot let drugs take over my life while alienating me from my loved ones. My wife is now as mad as hell because I keep telling her I will do right, then will turn around & broke her heart. I will do right this time. This is it, no more games. Camarion presents with a sad facial expression. He denies any SIHI, AVH, delusional thoughts or paranoia. He is taking & tolerating his medications without any averse effects reported. Diabetes coordinator consulted to review his current diabetes status, regimen & make recommendations. Staff continue to provide support.  Principal Problem: MDD (major depressive disorder), recurrent severe, without psychosis (HCC)  Diagnosis:   Patient Active Problem List   Diagnosis Date Noted  . MDD (major depressive disorder), recurrent severe, without psychosis (HCC) [F33.2] 06/26/2015    Priority: High  . MDD (major depressive disorder) [F32.9] 01/14/2017  . Cellulitis of left foot [L03.116] 09/28/2016  . Cellulitis [L03.90] 09/28/2016  . Cardiomyopathy- suspect NICM but etiology not yet determined [I42.9] 04/27/2016  . Acute combined systolic and diastolic heart failure (HCC) [I50.41] 04/27/2016  . History of major depression [Z86.59] 04/27/2016  . Community acquired pneumonia [J18.9] 04/26/2016  . Acute respiratory failure with hypoxia (HCC) [J96.01] 04/26/2016  . Controlled type 2 diabetes mellitus with hyperglycemia (HCC) [E11.65] 04/26/2016  . Uncontrolled type 2 diabetes mellitus with hyperglycemia (HCC) [E11.65] 04/26/2016  . 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 History of cocaine abuse [Z87.898] 06/25/2015  .  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 MUSCLE SPASM, BACK [M53.80] 02/12/2009  . 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 HYPERCHOLESTEROLEMIA [E78.00] 05/09/2007  . SINUSITIS, CHRONIC [J32.9] 05/01/2007  . ALLERGIC RHINITIS [J30.9] 12/21/2006  . Essential hypertension [I10] 12/07/2006   Total Time spent with patient: 25 minutes  Past Psychiatric History: Cocaine use disorder, MDD  Past Medical History:  Past Medical History:  Diagnosis Date  . Cellulitis and abscess of foot 09/29/2016  . Chronic systolic heart failure (HCC)   . Depression   . Diabetes mellitus without complication (HCC)   . Essential hypertension   . NICM (nonischemic cardiomyopathy) (HCC) EF 20-25%     Past Surgical History:  Procedure Laterality Date  . FINGER SURGERY    . Right/Left Heart Cath and Coronary Angiography N/A 04/30/2016   Performed by Yvonne Kendall, MD at [facility] INVASIVE CV LAB   Family History:  Family History  Problem Relation Age of Onset  . Diabetes Other   . Hypertension Other   . Diabetes Mother   . Brain cancer Father   . Mental illness Brother   . Drug abuse Brother   . Diabetes Brother    Family Psychiatric  History: See H&P Social History:  Social History   Substance and Sexual Activity  Alcohol Use No     Social History   Substance and Sexual Activity  Drug Use Yes  . Types: Cocaine, "Crack" cocaine   Comment: Every day a couple of times a day for the past two weeks     Social History   Socioeconomic History  . Marital status:  Married    Spouse name: None  . Number of children: None  . Years of education: None  . Highest education level: None  Social Needs  . Financial resource strain: None  . Food insecurity - worry: None  . Food insecurity - inability: None  . Transportation needs - medical: None  . Transportation needs - non-medical: None  Occupational History  . None  Tobacco Use  .  Smoking status: Current Every Day Smoker    Packs/day: 0.50    Years: 23.00    Pack years: 11.50    Types: Cigarettes  . Smokeless tobacco: Never Used  . Tobacco comment: Every other day   Substance and Sexual Activity  . Alcohol use: No  . Drug use: Yes    Types: Cocaine, "Crack" cocaine    Comment: Every day a couple of times a day for the past two weeks   . Sexual activity: Not Currently  Other Topics Concern  . None  Social History Narrative  . None   Additional Social History:   Sleep: Good  Appetite:  Good  Current Medications: Current Facility-Administered Medications  Medication Dose Route Frequency Provider Last Rate Last Dose  . acetaminophen (TYLENOL) tablet 650 mg  650 mg Oral Q6H PRN Laveda Abbe, NP      . alum & mag hydroxide-simeth (MAALOX/MYLANTA) 200-200-20 MG/5ML suspension 30 mL  30 mL Oral Q4H PRN Laveda Abbe, NP      . atorvastatin (LIPITOR) tablet 40 mg  40 mg Oral Daily Laveda Abbe, NP   40 mg at 02/25/17 0848  . buPROPion (WELLBUTRIN XL) 24 hr tablet 150 mg  150 mg Oral Daily Laveda Abbe, NP   150 mg at 02/25/17 0848  . carvedilol (COREG) tablet 25 mg  25 mg Oral BID WC Laveda Abbe, NP   25 mg at 02/25/17 0847  . furosemide (LASIX) tablet 20 mg  20 mg Oral Daily Laveda Abbe, NP   20 mg at 02/25/17 0847  . insulin aspart (novoLOG) injection 0-9 Units  0-9 Units Subcutaneous TID WC Armandina Stammer I, NP   1 Units at 02/25/17 [phone]  . insulin aspart (novoLOG) injection 3 Units  3 Units Subcutaneous TID WC Armandina Stammer I, NP   3 Units at 02/25/17 [phone]  . insulin aspart protamine- aspart (NOVOLOG MIX 70/30) injection 10 Units  10 Units Subcutaneous BID WC Laveda Abbe, NP   10 Units at 02/25/17 [phone]  . insulin glargine (LANTUS) injection 15 Units  15 Units Subcutaneous QHS Laveda Abbe, NP   15 Units at 02/24/17 2129  . magnesium hydroxide (MILK OF MAGNESIA) suspension 30 mL  30 mL Oral  Daily PRN Laveda Abbe, NP      . metFORMIN (GLUCOPHAGE) tablet 1,000 mg  1,000 mg Oral BID WC Laveda Abbe, NP   1,000 mg at 02/25/17 0847  . spironolactone (ALDACTONE) tablet 25 mg  25 mg Oral Daily Laveda Abbe, NP   25 mg at 02/25/17 0847  . traZODone (DESYREL) tablet 50 mg  50 mg Oral QHS PRN Laveda Abbe, NP   50 mg at 02/24/17 2128    Lab Results:  Results for orders placed or performed during the hospital encounter of 02/23/17 (from the past 48 hour(s))  Glucose, capillary     Status: None   Collection Time: 02/24/17  6:37 AM  Result Value Ref Range   Glucose-Capillary 70 65 - 99 mg/dL  Glucose, capillary     Status: Abnormal   Collection Time: 02/24/17 11:47 AM  Result Value Ref Range   Glucose-Capillary 254 (H) 65 - 99 mg/dL  Glucose, capillary     Status: Abnormal   Collection Time: 02/24/17  4:50 PM  Result Value Ref Range   Glucose-Capillary 434 (H) 65 - 99 mg/dL  Glucose, capillary     Status: Abnormal   Collection Time: 02/24/17  6:24 PM  Result Value Ref Range   Glucose-Capillary 361 (H) 65 - 99 mg/dL  Glucose, capillary     Status: Abnormal   Collection Time: 02/24/17  8:58 PM  Result Value Ref Range   Glucose-Capillary 60 (L) 65 - 99 mg/dL  Glucose, capillary     Status: Abnormal   Collection Time: 02/24/17  9:19 PM  Result Value Ref Range   Glucose-Capillary 101 (H) 65 - 99 mg/dL  Glucose, capillary     Status: Abnormal   Collection Time: 02/25/17  2:01 AM  Result Value Ref Range   Glucose-Capillary 37 (LL) 65 - 99 mg/dL  Glucose, capillary     Status: Abnormal   Collection Time: 02/25/17  2:23 AM  Result Value Ref Range   Glucose-Capillary 101 (H) 65 - 99 mg/dL  Glucose, capillary     Status: Abnormal   Collection Time: 02/25/17  6:05 AM  Result Value Ref Range   Glucose-Capillary 149 (H) 65 - 99 mg/dL  Glucose, capillary     Status: Abnormal   Collection Time: 02/25/17 12:37 PM  Result Value Ref Range    Glucose-Capillary 100 (H) 65 - 99 mg/dL    Blood Alcohol level:  Lab Results  Component Value Date   ETH <10 02/23/2017   ETH <10 01/13/2017    Metabolic Disorder Labs: Lab Results  Component Value Date   HGBA1C 13.1 (H) 09/29/2016   MPG 329 09/29/2016   MPG 332 04/27/2016   Lab Results  Component Value Date   PROLACTIN 10.7 01/16/2017   Lab Results  Component Value Date   CHOL 147 01/16/2017   TRIG 47 01/16/2017   HDL 72 01/16/2017   CHOLHDL 2.0 01/16/2017   VLDL 9 01/16/2017   LDLCALC 66 01/16/2017   LDLCALC 71 04/28/2016    Physical Findings: AIMS: Facial and Oral Movements Muscles of Facial Expression: None, normal Lips and Perioral Area: None, normal Jaw: None, normal Tongue: None, normal,Extremity Movements Upper (arms, wrists, hands, fingers): None, normal Lower (legs, knees, ankles, toes): None, normal, Trunk Movements Neck, shoulders, hips: None, normal, Overall Severity Severity of abnormal movements (highest score from questions above): None, normal Incapacitation due to abnormal movements: None, normal Patient's awareness of abnormal movements (rate only patient's report): No Awareness, Dental Status Current problems with teeth and/or dentures?: No Does patient usually wear dentures?: No  CIWA:    COWS:     Musculoskeletal: Strength & Muscle Tone: within normal limits Gait & Station: normal Patient leans: N/A  Psychiatric Specialty Exam: Physical Exam  Nursing note and vitals reviewed.   Review of Systems  Psychiatric/Behavioral: Positive for depression and substance abuse (Hx. Cocaine use disorder, chronic). Negative for hallucinations, memory loss and suicidal ideas. The patient is nervous/anxious. The patient does not have insomnia.     Blood pressure 118/64, pulse 87, temperature 98.2 F (36.8 C), temperature source Oral, resp. rate 16, height 6' (1.829 m), weight 62.6 kg (138 lb).Body mass index is 18.72 kg/m.  General Appearance:  Casual and Fairly Groomed  Eye Contact:  Good  Speech:  Clear and Coherent and Normal Rate  Volume:  Normal  Mood:  Depressed, feeling disappointed in myself  Affect:  Flat  Thought Process:  Coherent, Goal Directed and Descriptions of Associations: Intact  Orientation:  Full (Time, Place, and Person)  Thought Content:  Rumination  Suicidal Thoughts:  Denies any thoughts or plans  Homicidal Thoughts:  Denies  Memory:  Immediate;   Good Recent;   Good Remote;   Good  Judgement:  Good  Insight:  Fair  Psychomotor Activity:  Normal  Concentration:  Concentration: Good and Attention Span: Good  Recall:  Good  Fund of Knowledge:  Fair  Language:  Good  Akathisia:  Negative  Handed:  Right  AIMS (if indicated):     Assets:  Communication Skills Desire for Improvement  ADL's:  Intact  Cognition:  WNL  Sleep:  Number of Hours: 6   Assessment: Dayton ScrapeJohnnie is a 56 year old AA male with hx of major depression & Cocaine use disorder. He says during this assessment that he is approaching his baseline. Denies any evidence of psychosis. Denies any evidence of mania. No dangerousness. We are finalizing aftercare.   Treatment Plan Summary: Daily contact with patient to assess and evaluate symptoms and progress in treatment.  Will continue today 02/25/2017 plan as below except where it is noted.  Depression    -Continue Wellbutrin XL 150 mg po daily.  Insomnia    -Continue Trazodone 50 mg po Q hs.  Medical     DM: -Continue Metformin 1,000 po bid.              -Continue Novolog Mix 70/30 10 units bid with meals.              -Continue Lantus insulin 15 units Q hs                -Continue the sliding scale scale with the regular insulin coverage as recommended per blood result.       HTN: -Continue Spironolactone 25 mg po daily.                 -Continue Coreg 25 mg po bid.                 -Continue Lasix 20 mg po daily. Encourage group attendance & participation. SW to continue to work  on the discharge disposition.               Sanjuana KavaNwoko, Agnes I, NP, PMHNP, FNP-BC 02/25/2017, 2:25 PM

## 2017-02-25 NOTE — Progress Notes (Signed)
Pt has been up for a little while this evening.  He denies SI/HI/AVH at this time.  He says his withdrawal symptoms are mild to moderate at this time.  He presents flat/depressed.  He voices no needs or concerns, but wanted to get a sleep aid which was given.  His CBG was 60 tonight and was given graham cracker and 4 oz juice.  He was checked 20 minutes later and it was 101.  Support and encouragement offered.  Pt was encouraged to make his needs known to staff.  Discharge plans are in process.  Safety maintained with q15 minute checks.

## 2017-02-25 NOTE — Progress Notes (Signed)
Recreation Therapy Notes  Date: 02/25/17 Time: 0930 Location: [address] Hall Dayroom  Group Topic: Stress Management  Goal Area(s) Addresses:  Patient will verbalize importance of using healthy stress management.  Patient will identify positive emotions associated with healthy stress management.   Intervention: Stress Management  Activity :  Authentic Self Meditation.  LRT introduced the stress management technique of meditation.  LRT lead patients in a meditation that allowed them to explore the things that make them who they are.  Education:  Stress Management, Discharge Planning.   Education Outcome: Acknowledges edcuation/In group clarification offered/Needs additional education  Clinical Observations/Feedback: Pt did not attend group.    Caroll Rancher, LRT/CTRS         Caroll Rancher A 02/25/2017 12:28 PM

## 2017-02-25 NOTE — Progress Notes (Addendum)
Data. Patient denies SI/HI/AVH. Verbally contracts for safety on the unit and to come to staff before acting of any self harm thoughts/feelings/voices.  Patient interacting well with staff and other patients.  Action. Emotional support and encouragement offered. Education provided on medication, indications and side effect. Q 15 minute checks done for safety. Response. Safety on the unit maintained through 15 minute checks.  Medications taken as prescribed. Attended groups. Remained calm and appropriate through out shift. 

## 2017-02-25 NOTE — Progress Notes (Signed)
The patient attended the final five minutes of group and had nothing to share.

## 2017-02-25 NOTE — Tx Team (Signed)
Interdisciplinary Treatment and Diagnostic Plan Update  02/25/2017 Time of Session: 0830AM James [name] MRN: [id number]  Principal Diagnosis: MDD (major depressive disorder), recurrent severe, without psychosis (HCC)  Secondary Diagnoses: Principal Problem:   MDD (major depressive disorder), recurrent severe, without psychosis (HCC)   Current Medications:  Current Facility-Administered Medications  Medication Dose Route Frequency Provider Last Rate Last Dose  . acetaminophen (TYLENOL) tablet 650 mg  650 mg Oral Q6H PRN Laveda Abbe, NP      . alum & mag hydroxide-simeth (MAALOX/MYLANTA) 200-200-20 MG/5ML suspension 30 mL  30 mL Oral Q4H PRN Laveda Abbe, NP      . atorvastatin (LIPITOR) tablet 40 mg  40 mg Oral Daily Laveda Abbe, NP   40 mg at 02/25/17 0848  . buPROPion (WELLBUTRIN XL) 24 hr tablet 150 mg  150 mg Oral Daily Laveda Abbe, NP   150 mg at 02/25/17 0848  . carvedilol (COREG) tablet 25 mg  25 mg Oral BID WC Laveda Abbe, NP   25 mg at 02/25/17 0847  . furosemide (LASIX) tablet 20 mg  20 mg Oral Daily Laveda Abbe, NP   20 mg at 02/25/17 0847  . insulin aspart (novoLOG) injection 0-9 Units  0-9 Units Subcutaneous TID WC Armandina Stammer I, NP   1 Units at 02/25/17 [phone]  . insulin aspart (novoLOG) injection 3 Units  3 Units Subcutaneous TID WC Armandina Stammer I, NP   3 Units at 02/25/17 [phone]  . insulin aspart protamine- aspart (NOVOLOG MIX 70/30) injection 10 Units  10 Units Subcutaneous BID WC Laveda Abbe, NP   10 Units at 02/25/17 [phone]  . insulin glargine (LANTUS) injection 15 Units  15 Units Subcutaneous QHS Laveda Abbe, NP   15 Units at 02/24/17 2129  . magnesium hydroxide (MILK OF MAGNESIA) suspension 30 mL  30 mL Oral Daily PRN Laveda Abbe, NP      . metFORMIN (GLUCOPHAGE) tablet 1,000 mg  1,000 mg Oral BID WC Laveda Abbe, NP   1,000 mg at 02/25/17 0847  . spironolactone (ALDACTONE)  tablet 25 mg  25 mg Oral Daily Laveda Abbe, NP   25 mg at 02/25/17 0847  . traZODone (DESYREL) tablet 50 mg  50 mg Oral QHS PRN Laveda Abbe, NP   50 mg at 02/24/17 2128   PTA Medications: Medications Prior to Admission  Medication Sig Dispense Refill Last Dose  . atorvastatin (LIPITOR) 40 MG tablet Take 1 tablet (40 mg total) by mouth daily. For high cholesterol 30 tablet 0 Past Week at Unknown time  . buPROPion (WELLBUTRIN XL) 150 MG 24 hr tablet Take 1 tablet (150 mg total) by mouth daily. For depression 30 tablet 0 Past Month at Unknown time  . carvedilol (COREG) 25 MG tablet Take 1 tablet (25 mg total) by mouth 2 (two) times daily with a meal. 60 tablet 0 Past Week at Unknown time  . furosemide (LASIX) 20 MG tablet Take 1 tablet (20 mg total) by mouth daily. For swellings 30 tablet 0 Past Week at Unknown time  . insulin aspart protamine- aspart (NOVOLOG MIX 70/30) (70-30) 100 UNIT/ML injection Inject 0.1 mLs (10 Units total) into the skin 2 (two) times daily with a meal. For diabetes management 10 mL 0 Past Week at Unknown time  . insulin glargine (LANTUS) 100 UNIT/ML injection Inject 0.15 mLs (15 Units total) into the skin at bedtime. For diabetes management 10 mL 0 Past Week at  Unknown time  . Insulin Syringe-Needle U-100 (INSULIN SYRINGE .3CC/31GX5/16") 31G X 5/16" 0.3 ML MISC Use with lantus, once daily: For diabetes management 100 each 0   . metFORMIN (GLUCOPHAGE) 1000 MG tablet Take 1 tablet (1,000 mg total) by mouth 2 (two) times daily with a meal. For diabetes management 60 tablet 0 Past Week at Unknown time  . spironolactone (ALDACTONE) 25 MG tablet Take 1 tablet (25 mg total) by mouth daily. For high blood pressure 30 tablet 0 Past Week at Unknown time  . traZODone (DESYREL) 50 MG tablet Take 1 tablet (50 mg total) by mouth at bedtime as needed for sleep. 30 tablet 0 Past Month at Unknown time    Patient Stressors: Marital or family conflict Medication change or  noncompliance Substance abuse  Patient Strengths: Ability for insight Average or above average intelligence Capable of independent living General fund of knowledge Motivation for treatment/growth  Treatment Modalities: Medication Management, Group therapy, Case management,  1 to 1 session with clinician, Psychoeducation, Recreational therapy.   Physician Treatment Plan for Primary Diagnosis: MDD (major depressive disorder), recurrent severe, without psychosis (HCC) Long Term Goal(s): Improvement in symptoms so as ready for discharge Improvement in symptoms so as ready for discharge   Short Term Goals: Ability to identify changes in lifestyle to reduce recurrence of condition will improve Ability to verbalize feelings will improve Ability to identify and develop effective coping behaviors will improve Compliance with prescribed medications will improve Ability to identify triggers associated with substance abuse/mental health issues will improve  Medication Management: Evaluate patient's response, side effects, and tolerance of medication regimen.  Therapeutic Interventions: 1 to 1 sessions, Unit Group sessions and Medication administration.  Evaluation of Outcomes: Progressing  Physician Treatment Plan for Secondary Diagnosis: Principal Problem:   MDD (major depressive disorder), recurrent severe, without psychosis (HCC)  Long Term Goal(s): Improvement in symptoms so as ready for discharge Improvement in symptoms so as ready for discharge   Short Term Goals: Ability to identify changes in lifestyle to reduce recurrence of condition will improve Ability to verbalize feelings will improve Ability to identify and develop effective coping behaviors will improve Compliance with prescribed medications will improve Ability to identify triggers associated with substance abuse/mental health issues will improve     Medication Management: Evaluate patient's response, side effects, and  tolerance of medication regimen.  Therapeutic Interventions: 1 to 1 sessions, Unit Group sessions and Medication administration.  Evaluation of Outcomes: Progressing   RN Treatment Plan for Primary Diagnosis: MDD (major depressive disorder), recurrent severe, without psychosis (HCC) Long Term Goal(s): Knowledge of disease and therapeutic regimen to maintain health will improve  Short Term Goals: Ability to remain free from injury will improve, Ability to verbalize feelings will improve, Ability to disclose and discuss suicidal ideas and Ability to identify and develop effective coping behaviors will improve  Medication Management: RN will administer medications as ordered by provider, will assess and evaluate patient's response and provide education to patient for prescribed medication. RN will report any adverse and/or side effects to prescribing provider.  Therapeutic Interventions: 1 on 1 counseling sessions, Psychoeducation, Medication administration, Evaluate responses to treatment, Monitor vital signs and CBGs as ordered, Perform/monitor CIWA, COWS, AIMS and Fall Risk screenings as ordered, Perform wound care treatments as ordered.  Evaluation of Outcomes: Progressing   LCSW Treatment Plan for Primary Diagnosis: MDD (major depressive disorder), recurrent severe, without psychosis (HCC) Long Term Goal(s): Safe transition to appropriate next level of care at discharge, Engage patient in  therapeutic group addressing interpersonal concerns.  Short Term Goals: Engage patient in aftercare planning with referrals and resources, Facilitate patient progression through stages of change regarding substance use diagnoses and concerns and Identify triggers associated with mental health/substance abuse issues  Therapeutic Interventions: Assess for all discharge needs, 1 to 1 time with Social worker, Explore available resources and support systems, Assess for adequacy in community support network,  Educate family and significant other(s) on suicide prevention, Complete Psychosocial Assessment, Interpersonal group therapy.  Evaluation of Outcomes: Progressing   Progress in Treatment: Attending groups: Yes. Participating in groups: Yes. Taking medication as prescribed: Yes. Toleration medication: Yes. Family/Significant other contact made: Contact attempts made with pt's wife; SPE completed with pt.  Patient understands diagnosis: Yes. Discussing patient identified problems/goals with staff: Yes. Medical problems stabilized or resolved: Yes. Denies suicidal/homicidal ideation: Yes. Issues/concerns per patient self-inventory: No. Other: n/a  New problem(s) identified: No, Describe:  n/a  New Short Term/Long Term Goal(s): medication management for mood stabilization/detox, elimination of SI thoughts, development of comprehensive mental wellness/sobriety plan.   Discharge Plan or Barriers: CSW assessing for appropriate referrals. Pt did not want to commit to referral for inpatient treatment but was receptive to receiving information about [facility]. Referral made to [facility] for [facility] and pt would like to resume medication management with current provider--[facility] [place]. CSW left message with provider requesting appt time. Pt plans to return home.   Reason for Continuation of Hospitalization: Anxiety Depression Medication stabilization Withdrawal symptoms  Estimated Length of Stay: Monday, 02/28/17  Attendees: Patient: 02/25/2017 11:10 AM  Physician: Dr. Altamese Tucumcari MD; Dr. Jama Flavors MD 02/25/2017 11:10 AM  Nursing: Marga Hoots RN 02/25/2017 11:10 AM  RN Care Manager: Onnie Boer CM 02/25/2017 11:10 AM  Social Worker: Chartered loss adjuster, LCSW 02/25/2017 11:10 AM  Recreational Therapist: x 02/25/2017 11:10 AM  Other: Armandina Stammer NP; Hillery Jacks NP 02/25/2017 11:10 AM  Other:  02/25/2017 11:10 AM  Other: 02/25/2017 11:10 AM    Scribe for Treatment  Team: Ledell Peoples Smart, LCSW 02/25/2017 11:10 AM

## 2017-02-25 NOTE — Progress Notes (Signed)
Hypoglycemic Event  CBG: 55  Treatment: 15 GM carbohydrate snack  Symptoms: None  Follow-up CBG: Time: 2057 CBG Result:67  Possible Reasons for Event: Inadequate meal intake  Comments/MD notified: CBG checked again @2149  after another 15 gm. CBG is now 152. NP notified.   James [name]

## 2017-02-26 DIAGNOSIS — I10 Essential (primary) hypertension: Secondary | ICD-10-CM

## 2017-02-26 DIAGNOSIS — E119 Type 2 diabetes mellitus without complications: Secondary | ICD-10-CM

## 2017-02-26 LAB — GLUCOSE, CAPILLARY
GLUCOSE-CAPILLARY: 328 mg/dL — AB (ref 65–99)
GLUCOSE-CAPILLARY: 116 mg/dL — AB (ref 65–99)
Glucose-Capillary: 176 mg/dL — ABNORMAL HIGH (ref 65–99)
Glucose-Capillary: 73 mg/dL (ref 65–99)

## 2017-02-26 NOTE — Progress Notes (Signed)
[facility] MD Progress Note  02/26/2017 2:45 PM James [name]  MRN:  [id number]  Subjective: James [name] reports " I feel ready to leave"  Objective: James [name] is awake, alert and oriented. Patient reports he has plans to follow-up with groups and meetings on an out patient basis. Seen attending group session.  Denies suicidal or homicidal ideation. Denies auditory or visual hallucination and does not appear to be responding to internal stimuli. Patient appear goal and future oriented. Discussed discharge disposition with patient and MD. Patient report she is excited regarding discharge. Support, encouragement and reassurance was provided.   Principal Problem: MDD (major depressive disorder), recurrent severe, without psychosis (HCC)  Diagnosis:   Patient Active Problem List   Diagnosis Date Noted  . MDD (major depressive disorder) [F32.9] 01/14/2017  . Cellulitis of left foot [L03.116] 09/28/2016  . Cellulitis [L03.90] 09/28/2016  . Cardiomyopathy- suspect NICM but etiology not yet determined [I42.9] 04/27/2016  . Acute combined systolic and diastolic heart failure (HCC) [I50.41] 04/27/2016  . History of major depression [Z86.59] 04/27/2016  . Community acquired pneumonia [J18.9] 04/26/2016  . Acute respiratory failure with hypoxia (HCC) [J96.01] 04/26/2016  . Controlled type 2 diabetes mellitus with hyperglycemia (HCC) [E11.65] 04/26/2016  . Uncontrolled type 2 diabetes mellitus with hyperglycemia (HCC) [E11.65] 04/26/2016  . 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 MDD (major depressive disorder), recurrent severe, without psychosis (HCC) [F33.2] 06/26/2015  . History of cocaine abuse [Z87.898] 06/25/2015  . 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 MUSCLE SPASM, BACK [M53.80] 02/12/2009  . 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 HYPERCHOLESTEROLEMIA [E78.00] 05/09/2007  . SINUSITIS, CHRONIC [J32.9]  05/01/2007  . ALLERGIC RHINITIS [J30.9] 12/21/2006  . Essential hypertension [I10] 12/07/2006   Total Time spent with patient: 25 minutes  Past Psychiatric History: Cocaine use disorder, MDD  Past Medical History:  Past Medical History:  Diagnosis Date  . Cellulitis and abscess of foot 09/29/2016  . Chronic systolic heart failure (HCC)   . Depression   . Diabetes mellitus without complication (HCC)   . Essential hypertension   . NICM (nonischemic cardiomyopathy) (HCC) EF 20-25%     Past Surgical History:  Procedure Laterality Date  . FINGER SURGERY    . Right/Left Heart Cath and Coronary Angiography N/A 04/30/2016   Performed by Yvonne Kendall, MD at [facility] INVASIVE CV LAB   Family History:  Family History  Problem Relation Age of Onset  . Diabetes Other   . Hypertension Other   . Diabetes Mother   . Brain cancer Father   . Mental illness Brother   . Drug abuse Brother   . Diabetes Brother    Family Psychiatric  History: See H&P Social History:  Social History   Substance and Sexual Activity  Alcohol Use No     Social History   Substance and Sexual Activity  Drug Use Yes  . Types: Cocaine, "Crack" cocaine   Comment: Every day a couple of times a day for the past two weeks     Social History   Socioeconomic History  . Marital status: Married    Spouse name: None  . Number of children: None  . Years of education: None  . Highest education level: None  Social Needs  . Financial resource strain: None  . Food insecurity - worry: None  . Food insecurity - inability: None  . Transportation needs - medical: None  . Transportation needs - non-medical: None  Occupational History  . None  Tobacco Use  . Smoking status: Current Every Day Smoker    Packs/day: 0.50    Years: 23.00    Pack years: 11.50    Types: Cigarettes  . Smokeless tobacco: Never Used  . Tobacco comment: Every other day   Substance and Sexual Activity  . Alcohol use: No  . Drug use: Yes     Types: Cocaine, "Crack" cocaine    Comment: Every day a couple of times a day for the past two weeks   . Sexual activity: Not Currently  Other Topics Concern  . None  Social History Narrative  . None   Additional Social History:   Sleep: Good  Appetite:  Good  Current Medications: Current Facility-Administered Medications  Medication Dose Route Frequency Provider Last Rate Last Dose  . acetaminophen (TYLENOL) tablet 650 mg  650 mg Oral Q6H PRN Laveda Abbe, NP      . alum & mag hydroxide-simeth (MAALOX/MYLANTA) 200-200-20 MG/5ML suspension 30 mL  30 mL Oral Q4H PRN Laveda Abbe, NP      . atorvastatin (LIPITOR) tablet 40 mg  40 mg Oral Daily Laveda Abbe, NP   40 mg at 02/26/17 0906  . buPROPion (WELLBUTRIN XL) 24 hr tablet 150 mg  150 mg Oral Daily Laveda Abbe, NP   150 mg at 02/26/17 1610  . carvedilol (COREG) tablet 25 mg  25 mg Oral BID WC Laveda Abbe, NP   25 mg at 02/26/17 9604  . furosemide (LASIX) tablet 20 mg  20 mg Oral Daily Laveda Abbe, NP   20 mg at 02/26/17 0908  . insulin aspart (novoLOG) injection 0-9 Units  0-9 Units Subcutaneous TID WC Armandina Stammer I, NP   2 Units at 02/26/17 [phone]  . insulin aspart (novoLOG) injection 3 Units  3 Units Subcutaneous TID WC Armandina Stammer I, NP   3 Units at 02/26/17 [phone]  . insulin aspart protamine- aspart (NOVOLOG MIX 70/30) injection 10 Units  10 Units Subcutaneous BID WC Laveda Abbe, NP   10 Units at 02/26/17 [phone]  . insulin glargine (LANTUS) injection 15 Units  15 Units Subcutaneous QHS Laveda Abbe, NP   15 Units at 02/24/17 2129  . magnesium hydroxide (MILK OF MAGNESIA) suspension 30 mL  30 mL Oral Daily PRN Laveda Abbe, NP      . metFORMIN (GLUCOPHAGE) tablet 1,000 mg  1,000 mg Oral BID WC Laveda Abbe, NP   1,000 mg at 02/26/17 0907  . spironolactone (ALDACTONE) tablet 25 mg  25 mg Oral Daily Laveda Abbe, NP   25 mg at 02/26/17  2956  . traZODone (DESYREL) tablet 50 mg  50 mg Oral QHS PRN Laveda Abbe, NP   50 mg at 02/24/17 2128    Lab Results:  Results for orders placed or performed during the hospital encounter of 02/23/17 (from the past 48 hour(s))  Glucose, capillary     Status: Abnormal   Collection Time: 02/24/17  4:50 PM  Result Value Ref Range   Glucose-Capillary 434 (H) 65 - 99 mg/dL  Glucose, capillary     Status: Abnormal   Collection Time: 02/24/17  6:24 PM  Result Value Ref Range   Glucose-Capillary 361 (H) 65 - 99 mg/dL  Glucose, capillary     Status: Abnormal   Collection Time: 02/24/17  8:58 PM  Result Value Ref Range   Glucose-Capillary 60 (L) 65 - 99 mg/dL  Glucose, capillary     Status: Abnormal   Collection Time: 02/24/17  9:19 PM  Result Value Ref Range   Glucose-Capillary 101 (H) 65 - 99 mg/dL  Glucose, capillary     Status: Abnormal   Collection Time: 02/25/17  2:01 AM  Result Value Ref Range   Glucose-Capillary 37 (LL) 65 - 99 mg/dL  Glucose, capillary     Status: Abnormal   Collection Time: 02/25/17  2:23 AM  Result Value Ref Range   Glucose-Capillary 101 (H) 65 - 99 mg/dL  Glucose, capillary     Status: Abnormal   Collection Time: 02/25/17  6:05 AM  Result Value Ref Range   Glucose-Capillary 149 (H) 65 - 99 mg/dL  Glucose, capillary     Status: Abnormal   Collection Time: 02/25/17 12:37 PM  Result Value Ref Range   Glucose-Capillary 100 (H) 65 - 99 mg/dL  Glucose, capillary     Status: Abnormal   Collection Time: 02/25/17  5:02 PM  Result Value Ref Range   Glucose-Capillary 336 (H) 65 - 99 mg/dL   Comment 1 Notify RN    Comment 2 Document in Chart   Glucose, capillary     Status: Abnormal   Collection Time: 02/25/17  8:32 PM  Result Value Ref Range   Glucose-Capillary 55 (L) 65 - 99 mg/dL   Comment 1 Notify RN   Glucose, capillary     Status: None   Collection Time: 02/25/17  8:57 PM  Result Value Ref Range   Glucose-Capillary 67 65 - 99 mg/dL  Glucose,  capillary     Status: Abnormal   Collection Time: 02/25/17  9:49 PM  Result Value Ref Range   Glucose-Capillary 152 (H) 65 - 99 mg/dL  Glucose, capillary     Status: Abnormal   Collection Time: 02/26/17  6:03 AM  Result Value Ref Range   Glucose-Capillary 176 (H) 65 - 99 mg/dL   Comment 1 Notify RN   Glucose, capillary     Status: None   Collection Time: 02/26/17 11:57 AM  Result Value Ref Range   Glucose-Capillary 73 65 - 99 mg/dL    Blood Alcohol level:  Lab Results  Component Value Date   ETH <10 02/23/2017   ETH <10 01/13/2017    Metabolic Disorder Labs: Lab Results  Component Value Date   HGBA1C 13.1 (H) 09/29/2016   MPG 329 09/29/2016   MPG 332 04/27/2016   Lab Results  Component Value Date   PROLACTIN 10.7 01/16/2017   Lab Results  Component Value Date   CHOL 147 01/16/2017   TRIG 47 01/16/2017   HDL 72 01/16/2017   CHOLHDL 2.0 01/16/2017   VLDL 9 01/16/2017   LDLCALC 66 01/16/2017   LDLCALC 71 04/28/2016    Physical Findings: AIMS: Facial and Oral Movements Muscles of Facial Expression: None, normal Lips and Perioral Area: None, normal Jaw: None, normal Tongue: None, normal,Extremity Movements Upper (arms, wrists, hands, fingers): None, normal Lower (legs, knees, ankles, toes): None, normal, Trunk Movements Neck, shoulders, hips: None, normal, Overall Severity Severity of abnormal movements (highest score from questions above): None, normal Incapacitation due to abnormal movements: None, normal Patient's awareness of abnormal movements (rate only patient's report): No Awareness, Dental Status Current problems with teeth and/or dentures?: No Does patient usually wear dentures?: No  CIWA:    COWS:     Musculoskeletal: Strength & Muscle Tone: within normal limits Gait & Station: normal Patient leans: N/A  Psychiatric Specialty Exam: Physical Exam  Nursing note and vitals reviewed. Constitutional: He is oriented to person, place, and time. He  appears well-developed.  Cardiovascular: Normal rate.  Neurological: He is alert and oriented to person, place, and time.  Psychiatric: He has a normal mood and affect.    Review of Systems  Psychiatric/Behavioral: Positive for depression and substance abuse (Hx. Cocaine use disorder, chronic). Negative for hallucinations, memory loss and suicidal ideas. The patient is nervous/anxious. The patient does not have insomnia.     Blood pressure 125/63, pulse (!) 45, temperature 98.1 F (36.7 C), temperature source Oral, resp. rate 15, height 6' (1.829 m), weight 62.6 kg (138 lb).Body mass index is 18.72 kg/m.  General Appearance: Casual paperscrubs  Eye Contact:  Good  Speech:  Clear and Coherent  Volume:  Normal  Mood:  Anxious  Affect:  Flat  Thought Process:  Coherent, Goal Directed and Descriptions of Associations: Intact  Orientation:  Full (Time, Place, and Person)  Thought Content:  Rumination  Suicidal Thoughts:  Denies any thoughts or plans  Homicidal Thoughts:  Denies  Memory:  Immediate;   Good Recent;   Good Remote;   Good  Judgement:  Good  Insight:  Fair  Psychomotor Activity:  Normal  Concentration:  Concentration: Good and Attention Span: Good  Recall:  Good  Fund of Knowledge:  Fair  Language:  Good  Akathisia:  Negative  Handed:  Right  AIMS (if indicated):     Assets:  Communication Skills Desire for Improvement  ADL's:  Intact  Cognition:  WNL  Sleep:  Number of Hours: 5.75   .   Treatment Plan Summary: Daily contact with patient to assess and evaluate symptoms and progress in treatment.  Continue current treatment plan on  02/26/2017 below, except where it is noted.  Depression    -Continue Wellbutrin XL 150 mg po daily.  Insomnia    -Continue Trazodone 50 mg po Q hs.  Medical     DM: -Continue Metformin 1,000 po bid.              -Continue Novolog Mix 70/30 10 units bid with meals.              -Continue Lantus insulin 15 units Q hs                 -Continue the sliding scale scale with the regular insulin coverage as recommended per blood result.       HTN: -Continue Spironolactone 25 mg po daily.                 -Continue Coreg 25 mg po bid.                 -Continue Lasix 20 mg po daily. Encourage group attendance & participation. SW to continue to work on the discharge disposition.               James Rack, NP 02/26/2017, 2:45 PM Agree with NP Progress Note

## 2017-02-26 NOTE — [facility] Group Notes (Signed)
Goals Group  Date:  02/26/2017  Time:  0930  Type of Therapy:  Nurse Education  /  The group focuses on teaching patients how to define daily goals so they will be more attainable and hopefully given them sense of accomplishment.  Participation Level:  Active  Participation Quality:  Attentive  Affect:  Defensive  Cognitive:  Delusional  Insight:  Limited  Engagement in Group:  Defensive, Developing/Improving and Limited  Modes of Intervention:  Limit-setting  Summary of Progress/Problems:  James [name] 02/26/2017, 12:56 PM

## 2017-02-26 NOTE — Progress Notes (Signed)
DAR Note: Pt observed flat and withdrawn to room-remained in bed. Pt at the time of assessment endorsed moderate depression. Pt denied pain, SI, HI, anxiety or AVH. Pt CBG continue to fluctuate form very low to very high-Pt was asymptomatic. All patient's questions and concerns addressed. Support, encouragement, and safe environment provided. 15-minute safety checks continue. Pt did not attend AA meeting.

## 2017-02-26 NOTE — Progress Notes (Signed)
Patient did not attend AA group meeting. 

## 2017-02-26 NOTE — [facility] Group Notes (Signed)
[facility] LCSW Group Therapy Note  02/26/2017  @ 10:15 to 11:10 AM  Type of Therapy and Topic:  Group Therapy: Avoiding Self-Sabotaging and Enabling Behaviors  Participation Level:  Did Not Attend; invited to participate yet did not despite overhead announcement and encouragement by staff   Catherine C Harrill, LCSW  

## 2017-02-26 NOTE — Progress Notes (Signed)
Inpatient Diabetes Program Recommendations  AACE/ADA: New Consensus Statement on Inpatient Glycemic Control (2015)  Target Ranges:  Prepandial:   less than 140 mg/dL      Peak postprandial:   less than 180 mg/dL (1-2 hours)      Critically ill patients:  140 - 180 mg/dL  Results for SHYHEIM, ZAZUETA (MRN [id number]) as of 02/26/2017 08:14  Ref. Range 02/25/2017 06:05 02/25/2017 12:37 02/25/2017 17:02 02/25/2017 20:32 02/25/2017 20:57 02/25/2017 21:49 02/26/2017 06:03  Glucose-Capillary Latest Ref Range: 65 - 99 mg/dL 388 (H) 875 (H) 797 (H) 55 (L) 67 152 (H) 176 (H)    Review of Glycemic Control  Diabetes history: DM Outpatient Diabetes medications: 70/30 10 units BID, Lantus 15 units QHS, Metformin 1000 mg BID Current orders for Inpatient glycemic control: 70/30 10 units BID, Novolog 0-9 units TID with meals, Novolog 3 units TID with meals, Lantus 15 units QHS, Metformin 1000 mg BID  Inpatient Diabetes Program Recommendations:  Insulin - Basal: Please discontinue 70/30 insulin and continue Lantus 15 units QHS. Correction (SSI): Please consider ordering Novolog 0-5 units QHS for bedtime correction scale. Insulin - Meal Coverage: Please increase meal coverage to Novolog 4 units TID with meals if patient eats at least 50% of meals. Outpatient DM regimen: Not typical that both 70/30 and Lantus would be used together. Would recommend patient follow up with PCP or Endocrinologist for adjustments with DM medications.  Thanks, Orlando Penner, RN, MSN, CDE Diabetes Coordinator Inpatient Diabetes Program [phone] (Team Pager from 8am to 5pm)

## 2017-02-26 NOTE — Progress Notes (Signed)
James [name] has been OOB UAL on the 300 hall today..James [name] tolerates this fairly well. James [name] is flat, blunted and does not initiiate conversation with staff. [name] has attended his groups today and James [name] says James [name] si trying to get ready to get out of hosptial " and start to be healtheir", R Safety is in place.

## 2017-02-26 NOTE — Progress Notes (Signed)
D.  Pt pleasant on approach, complaint of insomnia.  Pt did not attend evening AA group, remained in room.  Pt denies SI/HI/AVH at this time.  Pt observed interacting appropriately with peers on unit.  A.  Support and encouragement offered, medication given as ordered.  R.  Pt remains safe on the unit, will continue to monitor.

## 2017-02-26 NOTE — Progress Notes (Signed)
D Patient is observed standing at the med window this morning . HE stares down at the floor, avoiding eye contact. He is flat, depressed, emotionless. A He takes his scheduled meds as ordered. HE completes his daily assessment and on this he wrote he denied SI today and he rated his depression, hopelessness and anxiety " 6/6/5", respectively. He requests a vanilla  glucerna and he is given one. HE wants to check his weight and this is done = 143 ( he says he weighted 138 lbs when he was admitted). R Safety is in place. POC includes continuing to encourage pt participation in his recovery program.

## 2017-02-27 LAB — GLUCOSE, CAPILLARY
GLUCOSE-CAPILLARY: 167 mg/dL — AB (ref 65–99)
GLUCOSE-CAPILLARY: 60 mg/dL — AB (ref 65–99)

## 2017-02-27 NOTE — Progress Notes (Signed)
Patient is prepared for dc as dc instructions are given to him. CC of SRA, AVS, SSP and transition record are given to him. Along with prescriptions and sample meds. Pt completed his daily assessment and on this he wrote he deneid SI today and he rated his depression, hopelessness and anxeity " 5/3/3/", respectively. Pt is dc'd per MD order.

## 2017-02-27 NOTE — [facility] Suicide Risk Assessment (Signed)
[facility] Discharge Suicide Risk Assessment   Principal Problem: MDD (major depressive disorder), recurrent severe, without psychosis (HCC) Discharge Diagnoses:  Patient Active Problem List   Diagnosis Date Noted  . MDD (major depressive disorder) [F32.9] 01/14/2017  . Cellulitis of left foot [L03.116] 09/28/2016  . Cellulitis [L03.90] 09/28/2016  . Cardiomyopathy- suspect NICM but etiology not yet determined [I42.9] 04/27/2016  . Acute combined systolic and diastolic heart failure (HCC) [I50.41] 04/27/2016  . History of major depression [Z86.59] 04/27/2016  . Community acquired pneumonia [J18.9] 04/26/2016  . Acute respiratory failure with hypoxia (HCC) [J96.01] 04/26/2016  . Controlled type 2 diabetes mellitus with hyperglycemia (HCC) [E11.65] 04/26/2016  . Uncontrolled type 2 diabetes mellitus with hyperglycemia (HCC) [E11.65] 04/26/2016  . 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 MDD (major depressive disorder), recurrent severe, without psychosis (HCC) [F33.2] 06/26/2015  . History of cocaine abuse [Z87.898] 06/25/2015  . 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 MUSCLE SPASM, BACK [M53.80] 02/12/2009  . 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 HYPERCHOLESTEROLEMIA [E78.00] 05/09/2007  . SINUSITIS, CHRONIC [J32.9] 05/01/2007  . ALLERGIC RHINITIS [J30.9] 12/21/2006  . Essential hypertension [I10] 12/07/2006    Total Time spent with patient: 30 minutes  Musculoskeletal: Strength & Muscle Tone: within normal limits Gait & Station: normal Patient leans: N/A  Psychiatric Specialty Exam: Review of Systems  Psychiatric/Behavioral: Positive for substance abuse. Negative for depression, hallucinations and suicidal ideas. The patient is not nervous/anxious and does not have insomnia.   All other systems reviewed and are negative.   Blood pressure (!) 101/55, pulse 82, temperature 98.3 F (36.8 C), temperature  source Oral, resp. rate 16, height 6' (1.829 m), weight 62.6 kg (138 lb).Body mass index is 18.72 kg/m.  General Appearance: Casual  Eye Contact::  Fair  Speech:  Normal Rate409  Volume:  Normal  Mood:  Euthymic  Affect:  Congruent  Thought Process:  Goal Directed and Descriptions of Associations: Intact  Orientation:  Full (Time, Place, and Person)  Thought Content:  Logical  Suicidal Thoughts:  No  Homicidal Thoughts:  No  Memory:  Immediate;   Fair Recent;   Fair Remote;   Fair  Judgement:  Fair  Insight:  Fair  Psychomotor Activity:  Normal  Concentration:  Fair  Recall:  [employer] of Knowledge:Fair  Language: Fair  Akathisia:  No  Handed:  Right  AIMS (if indicated):   NA  Assets:  Communication Skills Desire for Improvement  Sleep:  Number of Hours: 5  Cognition: WNL  ADL's:  Intact   Mental Status Per Nursing Assessment::   On Admission:     Demographic Factors:  Male  Loss Factors: NA  Historical Factors: Impulsivity  Risk Reduction Factors:   Positive social support and Positive therapeutic relationship  Continued Clinical Symptoms:  Alcohol/Substance Abuse/Dependencies  Cognitive Features That Contribute To Risk:  None    Suicide Risk:  Minimal: No identifiable suicidal ideation.  Patients presenting with no risk factors but with morbid ruminations; may be classified as minimal risk based on the severity of the depressive symptoms  Follow-up Information    [facility] Follow [facility].   Specialty:  Behavioral Health Why:  Walk in within 3 days of hospital discharge to be assessed for SAIOP/therapy. Walk in hours: Monday, Wednesday, and Fridays 12:30PM-3:00PM. Please bring: photo ID.  Contact information: [address] [place] [address] [phone]  [facility] Follow up.   Specialty:  Professional Counselor Why:  Message left requesting follow-up appt on 11/15. Please walk in  within 3 days of hospital discharge for hospital follow-up and to schedule with your current provider. Walk in hours: Monday through Friday 8am012pm. (Closed on 11/22 and 11/23).  Contact information: [facility] [address] [place] [address] [phone]           Plan Of Care/Follow-up recommendations:  Activity:  no restrictions Diet:  carb modified Tests:  as per outpatient provider Other:  follow up with after care as well as PMD  Jomarie LongsSaramma Arra Connaughton, MD 02/27/2017, 9:45 AM

## 2017-02-27 NOTE — [facility] Group Notes (Signed)
[facility] LCSW Group Therapy Note   02/27/2017  10:15 AM   Type of Therapy and Topic: Group Therapy: Feelings Around Returning Home & Establishing a Supportive Framework   Participation Level: Minimal    Description of Group:  Patients first processed thoughts and feelings about up coming discharge. These included fears of upcoming changes, lack of change, new living environments, judgements and expectations from others and overall stigma of MH issues. We then discussed what is a supportive framework? What does it look like feel like and how do I discern it from and unhealthy non-supportive network? Learn how to cope when supports are not helpful and don't support you. Discuss what to do when your family/friends are not supportive.   Therapeutic Goals Addressed in Processing Group:  1. Patient will identify one healthy supportive network that they can use at discharge. 2. Patient will identify one factor of a supportive framework and how to tell it from an unhealthy network. 3. Patient able to identify one coping skill to use when they do not have positive supports from others. 4. Patient will demonstrate ability to communicate their needs through discussion and/or role plays.  Summary of Patient Progress:  Pt was attentive during group session. As patients processed their anxiety about discharge and described healthy supports patient remained quietly attentive as evidenced by his body language.    Carney Bern, LCSW

## 2017-02-27 NOTE — Discharge Summary (Signed)
Physician Discharge Summary Note  Patient:  James [name] is an 56 y.o., male MRN:  [id number] DOB:  Nov 24, 1960 Patient phone:  [phone] (home)  Patient address:   [address] [place] [address],  Total Time spent with patient: 30 minutes  Date of Admission:  02/23/2017 Date of Discharge: 02/27/2017  Reason for Admission:Per HPI- This is one of the numerous admission assessments in this hospital alone for this 56 year old AA male with hx of Cocaine use disorder. Chung was discharged from this hospital last month with a referral & an appointment for an outpatient psychiatric services. His UDS was positive for Cocaine. During this assessment, Cameren reports, "I went to the hospital yesterday morning by an ambulance. I was hanging out at a gas station when someone saw me & called the ambulance. It was raining & I could not go home because I was feeling really guilty & disappointed in myself. Last Saturday was my grand-daughter's birthday. Instead of being there to celebrate her birthday, I chose to go get get high on cocaine. I have been very angry at myself. When I got discharged from this hospital last month, I relapsed within 2 weeks. I have not been really taking my depression medicines like I'm suppose to. While I was out there in the rain yesterday, I became very suicidal. I was gonna cut myself but, I could not find my razor. This time, I mean business, I will need to go a long term substance abuse treatment center".  Principal Problem: MDD (major depressive disorder), recurrent severe, without psychosis [facility]) Discharge Diagnoses: Patient Active Problem List   Diagnosis Date Noted  . MDD (major depressive disorder) [F32.9] 01/14/2017  . Cellulitis of left foot [L03.116] 09/28/2016  . Cellulitis [L03.90] 09/28/2016  . Cardiomyopathy- suspect NICM but etiology not yet determined [I42.9] 04/27/2016  . Acute combined systolic and diastolic heart failure (HCC) [I50.41]  04/27/2016  . History of major depression [Z86.59] 04/27/2016  . Community acquired pneumonia [J18.9] 04/26/2016  . Acute respiratory failure with hypoxia (HCC) [J96.01] 04/26/2016  . Controlled type 2 diabetes mellitus with hyperglycemia (HCC) [E11.65] 04/26/2016  . Uncontrolled type 2 diabetes mellitus with hyperglycemia (HCC) [E11.65] 04/26/2016  . 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 MDD (major depressive disorder), recurrent severe, without psychosis (HCC) [F33.2] 06/26/2015  . History of cocaine abuse [Z87.898] 06/25/2015  . 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 MUSCLE SPASM, BACK [M53.80] 02/12/2009  . 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 HYPERCHOLESTEROLEMIA [E78.00] 05/09/2007  . SINUSITIS, CHRONIC [J32.9] 05/01/2007  . ALLERGIC RHINITIS [J30.9] 12/21/2006  . Essential hypertension [I10] 12/07/2006    Past Psychiatric History:   Past Medical History:  Past Medical History:  Diagnosis Date  . Cellulitis and abscess of foot 09/29/2016  . Chronic systolic heart failure (HCC)   . Depression   . Diabetes mellitus without complication (HCC)   . Essential hypertension   . NICM (nonischemic cardiomyopathy) (HCC) EF 20-25%     Past Surgical History:  Procedure Laterality Date  . FINGER SURGERY    . Right/Left Heart Cath and Coronary Angiography N/A 04/30/2016   Performed by Yvonne Kendall, MD at [facility] INVASIVE CV LAB   Family History:  Family History  Problem Relation Age of Onset  . Diabetes Other   . Hypertension Other   . Diabetes Mother   . Brain cancer Father   . Mental illness Brother   . Drug abuse Brother   .  Diabetes Brother    Family Psychiatric  History:  Social History:  Social History   Substance and Sexual Activity  Alcohol Use No     Social History   Substance and Sexual Activity  Drug Use Yes  . Types: Cocaine, "Crack" cocaine   Comment: Every day a couple  of times a day for the past two weeks     Social History   Socioeconomic History  . Marital status: Married    Spouse name: None  . Number of children: None  . Years of education: None  . Highest education level: None  Social Needs  . Financial resource strain: None  . Food insecurity - worry: None  . Food insecurity - inability: None  . Transportation needs - medical: None  . Transportation needs - non-medical: None  Occupational History  . None  Tobacco Use  . Smoking status: Current Every Day Smoker    Packs/day: 0.50    Years: 23.00    Pack years: 11.50    Types: Cigarettes  . Smokeless tobacco: Never Used  . Tobacco comment: Every other day   Substance and Sexual Activity  . Alcohol use: No  . Drug use: Yes    Types: Cocaine, "Crack" cocaine    Comment: Every day a couple of times a day for the past two weeks   . Sexual activity: Not Currently  Other Topics Concern  . None  Social History Narrative  . None    Hospital Course:  Devin GoingJohnnie L Finlay was admitted for MDD (major depressive disorder), recurrent severe, without psychosis (HCC)  and crisis management.  Pt was treated discharged with the medications listed below under Medication List.  Medical problems were identified and treated as needed.  Home medications were restarted as appropriate.  Improvement was monitored by observation and Devin GoingJohnnie L Cherubin 's daily report of symptom reduction.  Emotional and mental status was monitored by daily self-inventory reports completed by Devin GoingJohnnie L Gerard and clinical staff.         Devin GoingJohnnie L Krise was evaluated by the treatment team for stability and plans for continued recovery upon discharge. Devin GoingJohnnie L Carrico 's motivation was an integral factor for scheduling further treatment. Employment, transportation, bed availability, health status, family support, and any pending legal issues were also considered during hospital stay. Pt was offered further treatment options upon  discharge including but not limited to Residential, Intensive Outpatient, and Outpatient treatment.  Devin GoingJohnnie L Solorzano will follow up with the services as listed below under Follow Up Information.     Upon completion of this admission the patient was both mentally and medically stable for discharge denying suicidal/homicidal ideation, auditory/visual/tactile hallucinations, delusional thoughts and paranoia.     Kyreese Alvina FilbertL Doubek responded well to treatment with Wellbutrin, Trazodne without adverse effects. Pt demonstrated improvement without reported or observed adverse effects to the point of stability appropriate for outpatient management. Pertinent labs include: CBC,CMP  for which outpatient follow-up is necessary for lab recheck as mentioned below. Reviewed CBC, CMP, BAL, and UDS; all unremarkable aside from noted exceptions.   Physical Findings: AIMS: Facial and Oral Movements Muscles of Facial Expression: None, normal Lips and Perioral Area: None, normal Jaw: None, normal Tongue: None, normal,Extremity Movements Upper (arms, wrists, hands, fingers): None, normal Lower (legs, knees, ankles, toes): None, normal, Trunk Movements Neck, shoulders, hips: None, normal, Overall Severity Severity of abnormal movements (highest score from questions above): None, normal Incapacitation due to abnormal movements: None, normal Patient's awareness of  abnormal movements (rate only patient's report): No Awareness, Dental Status Current problems with teeth and/or dentures?: No Does patient usually wear dentures?: No  CIWA:    COWS:     Musculoskeletal: Strength & Muscle Tone: within normal limits Gait & Station: normal Patient leans: N/A  Psychiatric Specialty Exam: See SRA by MD Physical Exam  Nursing note and vitals reviewed. Constitutional: He is oriented to person, place, and time. He appears well-developed.  Cardiovascular: Normal rate.  Neurological: He is alert and oriented to person,  place, and time.  Psychiatric: He has a normal mood and affect. His behavior is normal.    Review of Systems  Psychiatric/Behavioral: Negative for depression and suicidal ideas. The patient is not nervous/anxious.     Blood pressure (!) 101/55, pulse 82, temperature 98.3 F (36.8 C), temperature source Oral, resp. rate 16, height 6' (1.829 m), weight 62.6 kg (138 lb).Body mass index is 18.72 kg/m.    Have you used any form of tobacco in the last 30 days? (Cigarettes, Smokeless Tobacco, Cigars, and/or Pipes): Yes  Has this patient used any form of tobacco in the last 30 days? (Cigarettes, Smokeless Tobacco, Cigars, and/or Pipes) , Yes, A prescription for an FDA-approved tobacco cessation medication was offered at discharge and the patient refused  Blood Alcohol level:  Lab Results  Component Value Date   [facility] <10 02/23/2017   ETH <10 01/13/2017    Metabolic Disorder Labs:  Lab Results  Component Value Date   HGBA1C 13.1 (H) 09/29/2016   MPG 329 09/29/2016   MPG 332 04/27/2016   Lab Results  Component Value Date   PROLACTIN 10.7 01/16/2017   Lab Results  Component Value Date   CHOL 147 01/16/2017   TRIG 47 01/16/2017   HDL 72 01/16/2017   CHOLHDL 2.0 01/16/2017   VLDL 9 01/16/2017   LDLCALC 66 01/16/2017   LDLCALC 71 04/28/2016    See Psychiatric Specialty Exam and Suicide Risk Assessment completed by Attending Physician prior to discharge.  Discharge destination:  Home  Is patient on multiple antipsychotic therapies at discharge:  No   Has Patient had three or more failed trials of antipsychotic monotherapy by history:  No  Recommended Plan for Multiple Antipsychotic Therapies: NA  Discharge Instructions    Diet - low sodium heart healthy   Complete by:  As directed    Discharge instructions   Complete by:  As directed    Take all medications as prescribed. Keep all follow-up appointments as scheduled.  Do not consume alcohol or use illegal drugs while on  prescription medications. Report any adverse effects from your medications to your primary care provider promptly.  In the event of recurrent symptoms or worsening symptoms, call 911, a crisis hotline, or go to the nearest emergency department for evaluation.   Increase activity slowly   Complete by:  As directed      Allergies as of 02/27/2017   No Known Allergies     Medication List    TAKE these medications     Indication  atorvastatin 40 MG tablet Commonly known as:  LIPITOR Take 1 tablet (40 mg total) by mouth daily. For high cholesterol  Indication:  Inherited Homozygous Hypercholesterolemia, Increased Fats, Triglycerides & Cholesterol in the Blood   buPROPion 150 MG 24 hr tablet Commonly known as:  WELLBUTRIN XL Take 1 tablet (150 mg total) by mouth daily. For depression  Indication:  Attention Deficit Hyperactivity Disorder   carvedilol 25 MG tablet Commonly known  as:  COREG Take 1 tablet (25 mg total) by mouth 2 (two) times daily with a meal.  Indication:  High Blood Pressure of Unknown Cause   furosemide 20 MG tablet Commonly known as:  LASIX Take 1 tablet (20 mg total) by mouth daily. For swellings  Indication:  Edema, High Blood Pressure Disorder   insulin aspart protamine- aspart (70-30) 100 UNIT/ML injection Commonly known as:  NOVOLOG MIX 70/30 Inject 0.1 mLs (10 Units total) into the skin 2 (two) times daily with a meal. For diabetes management  Indication:  Type 2 Diabetes   insulin glargine 100 UNIT/ML injection Commonly known as:  LANTUS Inject 0.15 mLs (15 Units total) into the skin at bedtime. For diabetes management  Indication:  Type 2 Diabetes   INSULIN SYRINGE .3CC/31GX5/16" 31G X 5/16" 0.3 ML Misc Use with lantus, once daily: For diabetes management  Indication:  Diabetes management   metFORMIN 1000 MG tablet Commonly known as:  GLUCOPHAGE Take 1 tablet (1,000 mg total) by mouth 2 (two) times daily with a meal. For diabetes management   Indication:  Type 2 Diabetes   spironolactone 25 MG tablet Commonly known as:  ALDACTONE Take 1 tablet (25 mg total) by mouth daily. For high blood pressure  Indication:  High Blood Pressure Disorder   traZODone 50 MG tablet Commonly known as:  DESYREL Take 1 tablet (50 mg total) by mouth at bedtime as needed for sleep.  Indication:  Trouble Sleeping      Follow-up Information    [facility] Follow up.   Specialty:  Behavioral Health Why:  Walk in within 3 days of hospital discharge to be assessed for SAIOP/therapy. Walk in hours: Monday, Wednesday, and Fridays 12:30PM-3:00PM. Please bring: photo ID.  Contact information: [address] [place] [address] [phone]        [facility] Follow up.   Specialty:  Professional Counselor Why:  Message left requesting follow-up appt on 11/15. Please walk in within 3 days of hospital discharge for hospital follow-up and to schedule with your current provider. Walk in hours: Monday through Friday 8am012pm. (Closed on 11/22 and 11/23).  Contact information: [facility] [address] [place] [address] [phone]           Follow-up recommendations:  Activity:  as tolerated Diet:  heart healthy  Comments:  Take all medications as prescribed. Keep all follow-up appointments as scheduled.  Do not consume alcohol or use illegal drugs while on prescription medications. Report any adverse effects from your medications to your primary care provider promptly.  In the event of recurrent symptoms or worsening symptoms, call 911, a crisis hotline, or go to the nearest emergency department for evaluation.   Signed: Oneta Rack, NP 02/27/2017, 9:44 AM

## 2017-02-27 NOTE — Progress Notes (Signed)
  [facility] Adult Case Management Discharge Plan :  Will you be returning to the same living situation after discharge:  Yes,  with spouse At discharge, do you have transportation home?: Yes,  no barriers identified Do you have the ability to pay for your medications: No.  No income or insurance.  Release of information consent forms completed and turned in to Medical Records.  Patient to Follow up at: Follow-up Information    [facility].   Specialty:  Behavioral Health Why:  Walk in within 3 days of hospital discharge to be assessed for SAIOP/therapy. Walk in hours: Monday, Wednesday, and Fridays 12:30PM-3:00PM. Please bring: photo ID.  Contact information: [address] [place] [address] [phone]        [facility] Follow up.   Specialty:  Professional Counselor Why:  Message left requesting follow-up appt on 11/15. Please walk in within 3 days of hospital discharge for hospital follow-up and to schedule with your current provider. Walk in hours: Monday through Friday 8am012pm. (Closed on 11/22 and 11/23).  Contact information: [facility] [address] [place] [address] [phone]           Next level of care provider has access to [facility] Link:no  Safety Planning and Suicide Prevention discussed: Yes,  with patient, as wife not accessible  Have you used any form of tobacco in the last 30 days? (Cigarettes, Smokeless Tobacco, Cigars, and/or Pipes): Yes  Has patient been referred to the Quitline?: Patient refused referral  Patient has been referred for addiction treatment: Yes  Lynnell Chad, LCSW 02/27/2017, 9:23 AM

## 2017-07-11 ENCOUNTER — Encounter (HOSPITAL_COMMUNITY): Payer: Self-pay | Admitting: Emergency Medicine

## 2017-07-11 ENCOUNTER — Other Ambulatory Visit: Payer: Self-pay

## 2017-07-11 DIAGNOSIS — Z79891 Long term (current) use of opiate analgesic: Secondary | ICD-10-CM

## 2017-07-11 DIAGNOSIS — F17210 Nicotine dependence, cigarettes, uncomplicated: Secondary | ICD-10-CM | POA: Diagnosis present

## 2017-07-11 DIAGNOSIS — Z79899 Other long term (current) drug therapy: Secondary | ICD-10-CM

## 2017-07-11 DIAGNOSIS — I5022 Chronic systolic (congestive) heart failure: Secondary | ICD-10-CM | POA: Diagnosis present

## 2017-07-11 DIAGNOSIS — D509 Iron deficiency anemia, unspecified: Secondary | ICD-10-CM | POA: Diagnosis present

## 2017-07-11 DIAGNOSIS — E871 Hypo-osmolality and hyponatremia: Secondary | ICD-10-CM | POA: Diagnosis present

## 2017-07-11 DIAGNOSIS — R45851 Suicidal ideations: Secondary | ICD-10-CM | POA: Diagnosis present

## 2017-07-11 DIAGNOSIS — F1910 Other psychoactive substance abuse, uncomplicated: Secondary | ICD-10-CM | POA: Diagnosis present

## 2017-07-11 DIAGNOSIS — Z808 Family history of malignant neoplasm of other organs or systems: Secondary | ICD-10-CM

## 2017-07-11 DIAGNOSIS — I110 Hypertensive heart disease with heart failure: Secondary | ICD-10-CM | POA: Diagnosis present

## 2017-07-11 DIAGNOSIS — Z813 Family history of other psychoactive substance abuse and dependence: Secondary | ICD-10-CM

## 2017-07-11 DIAGNOSIS — Z9114 Patient's other noncompliance with medication regimen: Secondary | ICD-10-CM

## 2017-07-11 DIAGNOSIS — E7800 Pure hypercholesterolemia, unspecified: Secondary | ICD-10-CM | POA: Diagnosis present

## 2017-07-11 DIAGNOSIS — J181 Lobar pneumonia, unspecified organism: Principal | ICD-10-CM | POA: Diagnosis present

## 2017-07-11 DIAGNOSIS — F329 Major depressive disorder, single episode, unspecified: Secondary | ICD-10-CM | POA: Diagnosis present

## 2017-07-11 DIAGNOSIS — E1165 Type 2 diabetes mellitus with hyperglycemia: Secondary | ICD-10-CM | POA: Diagnosis present

## 2017-07-11 DIAGNOSIS — Z818 Family history of other mental and behavioral disorders: Secondary | ICD-10-CM

## 2017-07-11 DIAGNOSIS — F1410 Cocaine abuse, uncomplicated: Secondary | ICD-10-CM | POA: Diagnosis present

## 2017-07-11 DIAGNOSIS — R000 Tachycardia, unspecified: Secondary | ICD-10-CM | POA: Diagnosis present

## 2017-07-11 DIAGNOSIS — Z833 Family history of diabetes mellitus: Secondary | ICD-10-CM

## 2017-07-11 DIAGNOSIS — Z8249 Family history of ischemic heart disease and other diseases of the circulatory system: Secondary | ICD-10-CM

## 2017-07-11 DIAGNOSIS — G7289 Other specified myopathies: Secondary | ICD-10-CM | POA: Diagnosis present

## 2017-07-11 DIAGNOSIS — I428 Other cardiomyopathies: Secondary | ICD-10-CM | POA: Diagnosis present

## 2017-07-11 DIAGNOSIS — Z794 Long term (current) use of insulin: Secondary | ICD-10-CM

## 2017-07-11 NOTE — ED Triage Notes (Signed)
Pt c/o suicidal thoughts d/t recent loss of brother and relapse on cocaine.  Pt also c/o productive cough with green sputum.

## 2017-07-12 ENCOUNTER — Emergency Department (HOSPITAL_COMMUNITY): Payer: Self-pay

## 2017-07-12 ENCOUNTER — Encounter (HOSPITAL_COMMUNITY): Payer: Self-pay | Admitting: Emergency Medicine

## 2017-07-12 ENCOUNTER — Inpatient Hospital Stay (HOSPITAL_COMMUNITY)
Admission: EM | Admit: 2017-07-12 | Discharge: 2017-07-14 | DRG: 194 | Disposition: A | Discharge status: Home / Self Care | Payer: Federal, State, Local not specified - Other | Attending: Family Medicine | Admitting: Family Medicine

## 2017-07-12 ENCOUNTER — Other Ambulatory Visit: Payer: Self-pay

## 2017-07-12 DIAGNOSIS — F1910 Other psychoactive substance abuse, uncomplicated: Secondary | ICD-10-CM | POA: Diagnosis not present

## 2017-07-12 DIAGNOSIS — Z813 Family history of other psychoactive substance abuse and dependence: Secondary | ICD-10-CM

## 2017-07-12 DIAGNOSIS — R739 Hyperglycemia, unspecified: Secondary | ICD-10-CM

## 2017-07-12 DIAGNOSIS — J189 Pneumonia, unspecified organism: Secondary | ICD-10-CM

## 2017-07-12 DIAGNOSIS — E1165 Type 2 diabetes mellitus with hyperglycemia: Secondary | ICD-10-CM | POA: Diagnosis present

## 2017-07-12 DIAGNOSIS — I429 Cardiomyopathy, unspecified: Secondary | ICD-10-CM

## 2017-07-12 DIAGNOSIS — G4700 Insomnia, unspecified: Secondary | ICD-10-CM | POA: Diagnosis not present

## 2017-07-12 DIAGNOSIS — J181 Lobar pneumonia, unspecified organism: Secondary | ICD-10-CM

## 2017-07-12 DIAGNOSIS — F17210 Nicotine dependence, cigarettes, uncomplicated: Secondary | ICD-10-CM

## 2017-07-12 DIAGNOSIS — R45851 Suicidal ideations: Secondary | ICD-10-CM

## 2017-07-12 DIAGNOSIS — F1410 Cocaine abuse, uncomplicated: Secondary | ICD-10-CM | POA: Diagnosis not present

## 2017-07-12 DIAGNOSIS — Z818 Family history of other mental and behavioral disorders: Secondary | ICD-10-CM

## 2017-07-12 DIAGNOSIS — I10 Essential (primary) hypertension: Secondary | ICD-10-CM | POA: Diagnosis present

## 2017-07-12 HISTORY — DX: Suicidal ideations: R45.851

## 2017-07-12 LAB — BASIC METABOLIC PANEL
ANION GAP: 11 (ref 5–15)
BUN: 13 mg/dL (ref 6–20)
CALCIUM: 8.4 mg/dL — AB (ref 8.9–10.3)
CO2: 25 mmol/L (ref 22–32)
Chloride: 94 mmol/L — ABNORMAL LOW (ref 101–111)
Creatinine, Ser: 0.92 mg/dL (ref 0.61–1.24)
GFR calc Af Amer: >60 mL/min (ref >60)
GLUCOSE: 385 mg/dL — AB (ref 65–99)
Potassium: 3.8 mmol/L (ref 3.5–5.1)
SODIUM: 130 mmol/L — AB (ref 135–145)

## 2017-07-12 LAB — CBC
HCT: 35.6 % — ABNORMAL LOW (ref 39.0–52.0)
HEMATOCRIT: 37.8 % — AB (ref 39.0–52.0)
Hemoglobin: 12.7 g/dL — ABNORMAL LOW (ref 13.0–17.0)
Hemoglobin: 12 g/dL — ABNORMAL LOW (ref 13.0–17.0)
MCH: 29 pg (ref 26.0–34.0)
MCH: 28.8 pg (ref 26.0–34.0)
MCHC: 33.6 g/dL (ref 30.0–36.0)
MCHC: 33.7 g/dL (ref 30.0–36.0)
MCV: 85.4 fL (ref 78.0–100.0)
MCV: 86.3 fL (ref 78.0–100.0)
Platelets: 299 10*3/uL (ref 150–400)
Platelets: 257 10*3/uL (ref 150–400)
RBC: 4.38 MIL/uL (ref 4.22–5.81)
RBC: 4.17 MIL/uL — ABNORMAL LOW (ref 4.22–5.81)
RDW: 13.8 % (ref 11.5–15.5)
RDW: 14.1 % (ref 11.5–15.5)
WBC: 7.6 10*3/uL (ref 4.0–10.5)
WBC: 6.3 10*3/uL (ref 4.0–10.5)

## 2017-07-12 LAB — COMPREHENSIVE METABOLIC PANEL
ALBUMIN: 3.3 g/dL — AB (ref 3.5–5.0)
ALK PHOS: 82 U/L (ref 38–126)
ALT: 15 U/L — AB (ref 17–63)
AST: 16 U/L (ref 15–41)
Anion gap: 14 (ref 5–15)
BILIRUBIN TOTAL: 0.6 mg/dL (ref 0.3–1.2)
BUN: 17 mg/dL (ref 6–20)
CALCIUM: 9.1 mg/dL (ref 8.9–10.3)
CO2: 23 mmol/L (ref 22–32)
Chloride: 89 mmol/L — ABNORMAL LOW (ref 101–111)
Creatinine, Ser: 1.14 mg/dL (ref 0.61–1.24)
GFR calc Af Amer: >60 mL/min (ref >60)
GFR calc non Af Amer: >60 mL/min (ref >60)
GLUCOSE: 604 mg/dL — AB (ref 65–99)
Potassium: 4.2 mmol/L (ref 3.5–5.1)
Sodium: 126 mmol/L — ABNORMAL LOW (ref 135–145)
TOTAL PROTEIN: 7.4 g/dL (ref 6.5–8.1)

## 2017-07-12 LAB — ETHANOL: Alcohol, Ethyl (B): <10 mg/dL (ref <10)

## 2017-07-12 LAB — CBG MONITORING, ED
GLUCOSE-CAPILLARY: 155 mg/dL — AB (ref 65–99)
GLUCOSE-CAPILLARY: 161 mg/dL — AB (ref 65–99)
Glucose-Capillary: 413 mg/dL — ABNORMAL HIGH (ref 65–99)
Glucose-Capillary: 362 mg/dL — ABNORMAL HIGH (ref 65–99)
Glucose-Capillary: 139 mg/dL — ABNORMAL HIGH (ref 65–99)
Glucose-Capillary: 122 mg/dL — ABNORMAL HIGH (ref 65–99)
Glucose-Capillary: 153 mg/dL — ABNORMAL HIGH (ref 65–99)
Glucose-Capillary: 175 mg/dL — ABNORMAL HIGH (ref 65–99)
Glucose-Capillary: 183 mg/dL — ABNORMAL HIGH (ref 65–99)

## 2017-07-12 LAB — INFLUENZA PANEL BY PCR (TYPE A & B)
Influenza A By PCR: NEGATIVE
Influenza B By PCR: NEGATIVE

## 2017-07-12 LAB — MRSA PCR SCREENING: MRSA by PCR: NEGATIVE

## 2017-07-12 LAB — SALICYLATE LEVEL: Salicylate Lvl: <7 mg/dL (ref 2.8–30.0)

## 2017-07-12 LAB — IRON AND TIBC
Iron: 19 ug/dL — ABNORMAL LOW (ref 45–182)
SATURATION RATIOS: 9 % — AB (ref 17.9–39.5)
TIBC: 214 ug/dL — AB (ref 250–450)
UIBC: 195 ug/dL

## 2017-07-12 LAB — STREP PNEUMONIAE URINARY ANTIGEN: Strep Pneumo Urinary Antigen: NEGATIVE

## 2017-07-12 LAB — CREATININE, SERUM: CREATININE: 1.04 mg/dL (ref 0.61–1.24)

## 2017-07-12 LAB — RAPID URINE DRUG SCREEN, HOSP PERFORMED
Amphetamines: NOT DETECTED
BARBITURATES: NOT DETECTED
Benzodiazepines: NOT DETECTED
Cocaine: POSITIVE — AB
Opiates: NOT DETECTED
TETRAHYDROCANNABINOL: NOT DETECTED

## 2017-07-12 LAB — VITAMIN B12: Vitamin B-12: 1822 pg/mL — ABNORMAL HIGH (ref 180–914)

## 2017-07-12 LAB — RETICULOCYTES
RBC.: 4.17 MIL/uL — AB (ref 4.22–5.81)
RETIC CT PCT: 0.4 % (ref 0.4–3.1)
Retic Count, Absolute: 16.7 10*3/uL — ABNORMAL LOW (ref 19.0–186.0)

## 2017-07-12 LAB — ACETAMINOPHEN LEVEL: Acetaminophen (Tylenol), Serum: <10 ug/mL — ABNORMAL LOW (ref 10–30)

## 2017-07-12 LAB — GLUCOSE, CAPILLARY
Glucose-Capillary: 351 mg/dL — ABNORMAL HIGH (ref 65–99)
Glucose-Capillary: 346 mg/dL — ABNORMAL HIGH (ref 65–99)

## 2017-07-12 LAB — FOLATE: Folate: 9.3 ng/mL (ref >5.9)

## 2017-07-12 LAB — HEMOGLOBIN A1C
Hgb A1c MFr Bld: 14.6 % — ABNORMAL HIGH (ref 4.8–5.6)
MEAN PLASMA GLUCOSE: 372.32 mg/dL

## 2017-07-12 LAB — TROPONIN I: Troponin I: <0.03 ng/mL (ref <0.03)

## 2017-07-12 LAB — FERRITIN: FERRITIN: 130 ng/mL (ref 24–336)

## 2017-07-12 MED ORDER — INSULIN ASPART 100 UNIT/ML ~~LOC~~ SOLN
0.0000 [IU] | Freq: Three times a day (TID) | SUBCUTANEOUS | Status: DC
  Administered 2017-07-12: 2 [IU] via SUBCUTANEOUS
  Administered 2017-07-12: 9 [IU] via SUBCUTANEOUS
  Administered 2017-07-13: 5 [IU] via SUBCUTANEOUS
  Administered 2017-07-13: 7 [IU] via SUBCUTANEOUS
  Administered 2017-07-13: 3 [IU] via SUBCUTANEOUS
  Filled 2017-07-12: qty 1

## 2017-07-12 MED ORDER — DEXTROSE 50 % IV SOLN: 25.0000 mL | INTRAVENOUS | Status: DC | PRN

## 2017-07-12 MED ORDER — ACETAMINOPHEN 325 MG PO TABS
650.0000 mg | ORAL_TABLET | Freq: Four times a day (QID) | ORAL | Status: DC | PRN
  Administered 2017-07-13: 650 mg via ORAL
  Filled 2017-07-12: qty 2

## 2017-07-12 MED ORDER — ATORVASTATIN CALCIUM 40 MG PO TABS
40.0000 mg | ORAL_TABLET | Freq: Every day | ORAL | Status: DC
  Administered 2017-07-12 – 2017-07-13 (×2): 40 mg via ORAL
  Filled 2017-07-12 (×3): qty 1

## 2017-07-12 MED ORDER — HYDRALAZINE HCL 20 MG/ML IJ SOLN: 10.0000 mg | Freq: Three times a day (TID) | INTRAMUSCULAR | Status: DC | PRN

## 2017-07-12 MED ORDER — AZITHROMYCIN 500 MG IV SOLR
500.0000 mg | Freq: Once | INTRAVENOUS | Status: AC
  Administered 2017-07-12: 500 mg via INTRAVENOUS
  Filled 2017-07-12: qty 500

## 2017-07-12 MED ORDER — FERROUS SULFATE 325 (65 FE) MG PO TABS
325.0000 mg | ORAL_TABLET | Freq: Two times a day (BID) | ORAL | Status: DC
  Administered 2017-07-12 – 2017-07-13 (×3): 325 mg via ORAL
  Filled 2017-07-12 (×3): qty 1

## 2017-07-12 MED ORDER — SODIUM CHLORIDE 0.9 % IV SOLN
1.0000 g | INTRAVENOUS | Status: DC
  Administered 2017-07-12 – 2017-07-13 (×2): 1 g via INTRAVENOUS
  Filled 2017-07-12 (×2): qty 10

## 2017-07-12 MED ORDER — SODIUM CHLORIDE 0.9 % IV SOLN
INTRAVENOUS | Status: DC
  Administered 2017-07-12 – 2017-07-13 (×3): via INTRAVENOUS

## 2017-07-12 MED ORDER — INSULIN REGULAR HUMAN 100 UNIT/ML IJ SOLN
INTRAMUSCULAR | Status: DC
  Administered 2017-07-12: 3.5 [IU]/h via INTRAVENOUS
  Filled 2017-07-12: qty 1

## 2017-07-12 MED ORDER — PNEUMOCOCCAL VAC POLYVALENT 25 MCG/0.5ML IJ INJ
0.5000 mL | INJECTION | INTRAMUSCULAR | Status: DC
  Filled 2017-07-12: qty 0.5

## 2017-07-12 MED ORDER — SODIUM CHLORIDE 0.9 % IV SOLN
500.0000 mg | INTRAVENOUS | Status: DC
  Administered 2017-07-12: 500 mg via INTRAVENOUS
  Filled 2017-07-12: qty 500

## 2017-07-12 MED ORDER — BUPROPION HCL ER (XL) 150 MG PO TB24
150.0000 mg | ORAL_TABLET | Freq: Every day | ORAL | Status: DC
  Administered 2017-07-12 – 2017-07-13 (×2): 150 mg via ORAL
  Filled 2017-07-12 (×2): qty 1

## 2017-07-12 MED ORDER — DEXTROSE-NACL 5-0.45 % IV SOLN: INTRAVENOUS | Status: DC

## 2017-07-12 MED ORDER — INSULIN GLARGINE 100 UNIT/ML ~~LOC~~ SOLN
15.0000 [IU] | Freq: Every day | SUBCUTANEOUS | Status: DC
  Administered 2017-07-12 – 2017-07-13 (×2): 15 [IU] via SUBCUTANEOUS
  Filled 2017-07-12 (×3): qty 0.15

## 2017-07-12 MED ORDER — DEXTROSE-NACL 5-0.45 % IV SOLN
INTRAVENOUS | Status: DC
  Administered 2017-07-12: 08:00:00 via INTRAVENOUS

## 2017-07-12 MED ORDER — ACETAMINOPHEN 650 MG RE SUPP: 650.0000 mg | Freq: Four times a day (QID) | RECTAL | Status: DC | PRN

## 2017-07-12 MED ORDER — SODIUM CHLORIDE 0.9 % IV SOLN
INTRAVENOUS | Status: DC
  Administered 2017-07-12: 05:00:00 via INTRAVENOUS

## 2017-07-12 MED ORDER — INSULIN REGULAR BOLUS VIA INFUSION
0.0000 [IU] | Freq: Three times a day (TID) | INTRAVENOUS | Status: DC
  Filled 2017-07-12: qty 10

## 2017-07-12 MED ORDER — TRAZODONE HCL 50 MG PO TABS: 50.0000 mg | ORAL_TABLET | Freq: Every evening | ORAL | Status: DC | PRN

## 2017-07-12 MED ORDER — INSULIN REGULAR HUMAN 100 UNIT/ML IJ SOLN
INTRAMUSCULAR | Status: DC
  Filled 2017-07-12: qty 1

## 2017-07-12 MED ORDER — SODIUM CHLORIDE 0.9 % IV SOLN
1.0000 g | Freq: Once | INTRAVENOUS | Status: AC
  Administered 2017-07-12: 1 g via INTRAVENOUS
  Filled 2017-07-12: qty 10

## 2017-07-12 MED ORDER — ENOXAPARIN SODIUM 40 MG/0.4ML ~~LOC~~ SOLN
40.0000 mg | Freq: Every day | SUBCUTANEOUS | Status: DC
  Administered 2017-07-12 – 2017-07-13 (×2): 40 mg via SUBCUTANEOUS
  Filled 2017-07-12 (×3): qty 0.4

## 2017-07-12 MED ORDER — LISINOPRIL 10 MG PO TABS
10.0000 mg | ORAL_TABLET | Freq: Every day | ORAL | Status: DC
  Administered 2017-07-12 – 2017-07-13 (×2): 10 mg via ORAL
  Filled 2017-07-12 (×2): qty 1

## 2017-07-12 NOTE — ED Provider Notes (Addendum)
[facility] EMERGENCY DEPARTMENT Provider Note   CSN: [id number] Arrival date & time: 07/11/17  2326     History   Chief Complaint Chief Complaint  Patient presents with  . Suicidal    HPI James [name] is a 57 y.o. male.  The history is provided by the patient.  Cough  This is a new problem. The current episode started more than 2 days ago. The problem occurs constantly. The problem has not changed since onset.The cough is productive of purulent sputum. There has been no fever. Pertinent negatives include no chest pain, no weight loss, no myalgias, no shortness of breath and no wheezing. He has tried nothing for the symptoms. The treatment provided no relief. He is a smoker. His past medical history does not include bronchiectasis.  Also has been off his DM medication and is feeling suicidal but has no plan.  No AH no VH  Past Medical History:  Diagnosis Date  . Cellulitis and abscess of foot 09/29/2016  . Chronic systolic heart failure (HCC)   . Depression   . Diabetes mellitus without complication (HCC)   . Essential hypertension   . NICM (nonischemic cardiomyopathy) (HCC) EF 20-25%     Patient Active Problem List   Diagnosis Date Noted  . MDD (major depressive disorder) 01/14/2017  . Cellulitis of left foot 09/28/2016  . Cellulitis 09/28/2016  . Cardiomyopathy- suspect NICM but etiology not yet determined 04/27/2016  . Acute combined systolic and diastolic heart failure (HCC) 04/27/2016  . History of major depression 04/27/2016  . Community acquired pneumonia 04/26/2016  . Acute respiratory failure with hypoxia (HCC) 04/26/2016  . Controlled type 2 diabetes mellitus with hyperglycemia (HCC) 04/26/2016  . Uncontrolled type 2 diabetes mellitus with hyperglycemia (HCC) 04/26/2016  . Alcohol use disorder, mild, abuse 12/12/2015  . MDD (major depressive disorder), recurrent severe, without psychosis (HCC) 06/26/2015  . History of cocaine abuse  06/25/2015  . SPONDYLOSIS, CERVICAL, WITH RADICULOPATHY 02/25/2009  . MUSCLE SPASM, BACK 02/12/2009  . LEUKOPENIA, MILD 08/19/2008  . ECZEMA, HANDS 08/06/2008  . HYPERCHOLESTEROLEMIA 05/09/2007  . SINUSITIS, CHRONIC 05/01/2007  . ALLERGIC RHINITIS 12/21/2006  . Essential hypertension 12/07/2006    Past Surgical History:  Procedure Laterality Date  . CARDIAC CATHETERIZATION N/A 04/30/2016   Procedure: Right/Left Heart Cath and Coronary Angiography;  Surgeon: Yvonne Kendall, MD;  Location: [facility] INVASIVE CV LAB;  Service: Cardiovascular;  Laterality: N/A;  . FINGER SURGERY          Home Medications    Prior to Admission medications   Medication Sig Start Date End Date Taking? Authorizing Provider  atorvastatin (LIPITOR) 40 MG tablet Take 1 tablet (40 mg total) by mouth daily. For high cholesterol 01/18/17   Nwoko, Nicole Kindred I, NP  buPROPion (WELLBUTRIN XL) 150 MG 24 hr tablet Take 1 tablet (150 mg total) by mouth daily. For depression 01/19/17   Armandina Stammer I, NP  carvedilol (COREG) 25 MG tablet Take 1 tablet (25 mg total) by mouth 2 (two) times daily with a meal. 01/18/17   Nwoko, Nicole Kindred I, NP  furosemide (LASIX) 20 MG tablet Take 1 tablet (20 mg total) by mouth daily. For swellings 01/18/17   Armandina Stammer I, NP  insulin aspart protamine- aspart (NOVOLOG MIX 70/30) (70-30) 100 UNIT/ML injection Inject 0.1 mLs (10 Units total) into the skin 2 (two) times daily with a meal. For diabetes management 01/18/17   Armandina Stammer I, NP  insulin glargine (LANTUS) 100 UNIT/ML injection Inject 0.15  mLs (15 Units total) into the skin at bedtime. For diabetes management 01/18/17   Armandina Stammer I, NP  Insulin Syringe-Needle U-100 (INSULIN SYRINGE .3CC/31GX5/16") 31G X 5/16" 0.3 ML MISC Use with lantus, once daily: For diabetes management 01/18/17   Armandina Stammer I, NP  metFORMIN (GLUCOPHAGE) 1000 MG tablet Take 1 tablet (1,000 mg total) by mouth 2 (two) times daily with a meal. For diabetes management 01/18/17    Armandina Stammer I, NP  spironolactone (ALDACTONE) 25 MG tablet Take 1 tablet (25 mg total) by mouth daily. For high blood pressure 01/19/17   Nwoko, Nicole Kindred I, NP  traZODone (DESYREL) 50 MG tablet Take 1 tablet (50 mg total) by mouth at bedtime as needed for sleep. 01/18/17   Sanjuana Kava, NP    Family History Family History  Problem Relation Age of Onset  . Diabetes Other   . Hypertension Other   . Diabetes Mother   . Brain cancer Father   . Mental illness Brother   . Drug abuse Brother   . Diabetes Brother     Social History Social History   Tobacco Use  . Smoking status: Current Every Day Smoker    Packs/day: 0.50    Years: 23.00    Pack years: 11.50    Types: Cigarettes  . Smokeless tobacco: Never Used  . Tobacco comment: Every other day   Substance Use Topics  . Alcohol use: No  . Drug use: Yes    Types: Cocaine, "Crack" cocaine     Allergies   Patient has no known allergies.   Review of Systems Review of Systems  Constitutional: Negative for diaphoresis and weight loss.  HENT: Negative for congestion.   Respiratory: Positive for cough. Negative for shortness of breath and wheezing.   Cardiovascular: Negative for chest pain, palpitations and leg swelling.  Gastrointestinal: Negative for diarrhea and vomiting.  Musculoskeletal: Negative for myalgias.  Skin: Negative for color change.  Psychiatric/Behavioral: Positive for dysphoric mood and suicidal ideas.  All other systems reviewed and are negative.    Physical Exam Updated Vital Signs BP (!) 144/82 (BP Location: Right Arm)   Pulse (!) 114   Temp 98.7 F (37.1 C) (Oral)   Resp 18   Ht 6' (1.829 m)   Wt 65.8 kg (145 lb)   SpO2 97%   BMI 19.67 kg/m   Physical Exam  Constitutional: He is oriented to person, place, and time. He appears well-developed and well-nourished. No distress.  HENT:  Head: Normocephalic and atraumatic.  Mouth/Throat: No oropharyngeal exudate.  Eyes: Pupils are equal, round,  and reactive to light. Conjunctivae are normal.  Neck: Normal range of motion. Neck supple.  Cardiovascular: Regular rhythm, normal heart sounds and intact distal pulses. Tachycardia present.  Pulmonary/Chest: No respiratory distress. He has decreased breath sounds. He has no wheezes.  Abdominal: Soft. Bowel sounds are normal. He exhibits no mass. There is no tenderness. There is no guarding.  Musculoskeletal: Normal range of motion. He exhibits no edema.  Neurological: He is alert and oriented to person, place, and time. He displays normal reflexes.  Skin: Skin is warm and dry. Capillary refill takes less than 2 seconds.  Psychiatric: His affect is blunt.     ED Treatments / Results  Labs (all labs ordered are listed, but only abnormal results are displayed) Results for orders placed or performed during the hospital encounter of 07/12/17  Comprehensive metabolic panel  Result Value Ref Range   Sodium 126 (L) 135 -  145 mmol/L   Potassium 4.2 3.5 - 5.1 mmol/L   Chloride 89 (L) 101 - 111 mmol/L   CO2 23 22 - 32 mmol/L   Glucose, Bld 604 (HH) 65 - 99 mg/dL   BUN 17 6 - 20 mg/dL   Creatinine, Ser 1.61 0.61 - 1.24 mg/dL   Calcium 9.1 8.9 - 09.6 mg/dL   Total Protein 7.4 6.5 - 8.1 g/dL   Albumin 3.3 (L) 3.5 - 5.0 g/dL   AST 16 15 - 41 U/L   ALT 15 (L) 17 - 63 U/L   Alkaline Phosphatase 82 38 - 126 U/L   Total Bilirubin 0.6 0.3 - 1.2 mg/dL   GFR calc non Af Amer >60 >60 mL/min   GFR calc Af Amer >60 >60 mL/min   Anion gap 14 5 - 15  Ethanol  Result Value Ref Range   Alcohol, Ethyl (B) <10 <10 mg/dL  Salicylate level  Result Value Ref Range   Salicylate Lvl <7.0 2.8 - 30.0 mg/dL  Acetaminophen level  Result Value Ref Range   Acetaminophen (Tylenol), Serum <10 (L) 10 - 30 ug/mL  cbc  Result Value Ref Range   WBC 7.6 4.0 - 10.5 K/uL   RBC 4.38 4.22 - 5.81 MIL/uL   Hemoglobin 12.7 (L) 13.0 - 17.0 g/dL   HCT 04.5 (L) 40.9 - 81.1 %   MCV 86.3 78.0 - 100.0 fL   MCH 29.0 26.0 -  34.0 pg   MCHC 33.6 30.0 - 36.0 g/dL   RDW 91.4 78.2 - 95.6 %   Platelets 299 150 - 400 K/uL  Rapid urine drug screen (hospital performed)  Result Value Ref Range   Opiates NONE DETECTED NONE DETECTED   Cocaine POSITIVE (A) NONE DETECTED   Benzodiazepines NONE DETECTED NONE DETECTED   Amphetamines NONE DETECTED NONE DETECTED   Tetrahydrocannabinol NONE DETECTED NONE DETECTED   Barbiturates NONE DETECTED NONE DETECTED   Dg Chest 2 View  Result Date: 07/12/2017 CLINICAL DATA:  57 year old male with cough. EXAM: CHEST - 2 VIEW COMPARISON:  Chest radiograph dated 04/30/2016 FINDINGS: Small bibasilar nodular densities concerning for pneumonia. Clinical correlation is recommended. There is no focal consolidation, pleural effusion, or pneumothorax. The cardiac silhouette is within normal limits. No acute osseous pathology. IMPRESSION: Bibasilar nodular densities concerning for pneumonia. Clinical correlation is recommended. No focal consolidation. Electronically Signed   By: Elgie Collard M.D.   On: 07/12/2017 00:34    EKG EKG Interpretation  Date/Time:  Tuesday Kiko Ripp 02 2019 00:02:49 EDT Ventricular Rate:  112 PR Interval:  138 QRS Duration: 78 QT Interval:  360 QTC Calculation: 491 R Axis:   91 Text Interpretation:  Sinus tachycardia Biatrial enlargement Rightward axis Left ventricular hypertrophy Nonspecific T wave abnormality Abnormal ECG When compared with ECG of 04/26/2016, Premature ventricular complexes are no longer present Confirmed by Dione Booze ([id number]) on 07/12/2017 12:06:10 AM   Radiology Dg Chest 2 View  Result Date: 07/12/2017 CLINICAL DATA:  57 year old male with cough. EXAM: CHEST - 2 VIEW COMPARISON:  Chest radiograph dated 04/30/2016 FINDINGS: Small bibasilar nodular densities concerning for pneumonia. Clinical correlation is recommended. There is no focal consolidation, pleural effusion, or pneumothorax. The cardiac silhouette is within normal limits. No acute osseous  pathology. IMPRESSION: Bibasilar nodular densities concerning for pneumonia. Clinical correlation is recommended. No focal consolidation. Electronically Signed   By: Elgie Collard M.D.   On: 07/12/2017 00:34    Procedures Procedures (including critical care time)  Medications Ordered in ED Medications  dextrose 5 %-0.45 % sodium chloride infusion (has no administration in time range)  insulin regular (NOVOLIN R,HUMULIN R) 100 Units in sodium chloride 0.9 % 100 mL (1 Units/mL) infusion (has no administration in time range)  cefTRIAXone (ROCEPHIN) 1 g in sodium chloride 0.9 % 100 mL IVPB (has no administration in time range)  azithromycin (ZITHROMAX) 500 mg in sodium chloride 0.9 % 250 mL IVPB (has no administration in time range)    MDM Reviewed: previous chart, nursing note and vitals Reviewed previous: labs Interpretation: labs, ECG and x-ray Total time providing critical care: 75-105 minutes. This excludes time spent performing separately reportable procedures and services. Consults: admitting MD    CRITICAL CARE Performed by: Jasmine Awe Total critical care time: 90 minutes Critical care time was exclusive of separately billable procedures and treating other patients. Critical care was necessary to treat or prevent imminent or life-threatening deterioration. Critical care was time spent personally by me on the following activities: development of treatment plan with patient and/or surrogate as well as nursing, discussions with consultants, evaluation of patient's response to treatment, examination of patient, obtaining history from patient or surrogate, ordering and performing treatments and interventions, ordering and review of laboratory studies, ordering and review of radiographic studies, pulse oximetry and re-evaluation of patient's condition.  Final Clinical Impressions(s) / ED Diagnoses   Final diagnoses:  Hyperglycemia  Community acquired pneumonia of left  lower lobe of lung (HCC)  Suicidal ideation   CAP with uncontrolled DM.  No active plan for suicidality.  Not medically clear will need medical management    Edmund Rick, MD 07/12/17 Eligah East, Kara Melching, MD 07/25/17 [id number]

## 2017-07-12 NOTE — ED Notes (Signed)
Pt has sitter at bedside; belongings inventoried; no signs of distress.

## 2017-07-12 NOTE — ED Notes (Signed)
Sitter at bedside; no needs

## 2017-07-12 NOTE — ED Notes (Signed)
Sitter at bedside; pt in no distress.

## 2017-07-12 NOTE — Consult Note (Signed)
[place] Psychiatry Consult   Reason for Consult:  Depression with SI Referring Physician:  Dr. Tyrell Antonio Patient Identification: GIANNY SABINO MRN:  [id number] Principal Diagnosis: Cocaine abuse [facility]) Diagnosis:   Patient Active Problem List   Diagnosis Date Noted  . Polysubstance abuse ([place]) [F19.10] 07/12/2017  . Suicidal ideations [R45.851] 07/12/2017  . MDD (major depressive disorder) [F32.9] 01/14/2017  . Cellulitis of left foot [L03.116] 09/28/2016  . Cellulitis [L03.90] 09/28/2016  . Cardiomyopathy- suspect NICM but etiology not yet determined [I42.9] 04/27/2016  . Acute combined systolic and diastolic heart failure ([place]) [I50.41] 04/27/2016  . History of major depression [Z86.59] 04/27/2016  . CAP (community acquired pneumonia) [J18.9] 04/26/2016  . Acute respiratory failure with hypoxia ([place]) [J96.01] 04/26/2016  . Controlled type 2 diabetes mellitus with hyperglycemia ([place]) [E11.65] 04/26/2016  . Uncontrolled type 2 diabetes mellitus with hyperglycemia ([place]) [E11.65] 04/26/2016  . 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 MDD (major depressive disorder), recurrent severe, without psychosis ([place]) [F33.2] 06/26/2015  . History of cocaine abuse [Z87.898] 06/25/2015  . 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 MUSCLE SPASM, BACK [M53.80] 02/12/2009  . 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 HYPERCHOLESTEROLEMIA [E78.00] 05/09/2007  . SINUSITIS, CHRONIC [J32.9] 05/01/2007  . ALLERGIC RHINITIS [J30.9] 12/21/2006  . Essential hypertension [I10] 12/07/2006    Total Time spent with patient: 1 hour  Subjective:   DAMAR PETIT is a 57 y.o. male patient admitted with CAP.  HPI:   Per chart review, patient was admitted with worsening cough with sputum production for the past 2-3 days. He is receiving treatment for CAP.  He has a history of depression and endorses SI.   Of note, he was last  admitted to [facility] in 02/2017 for worsening depression with SI. He was discharged on Wellbutrin 150 mg daily and Trazodone 50 mg qhs PRN. He was scheduled follow up with [facility] for substance abuse treatment and counseling with [facility]. UDS was positive for cocaine on this hospital admission. BAL was negative.   On interview, Mr. Sena reports that he is a recovering addict.  He reports that his brother passed away 2 weeks ago and he relapsed on cocaine use.  He has been using cocaine for 20 years.  His longest period of sobriety was 6 years.  He reports increased use after his father passed away 5 years ago.  He was recently clean for 4 months until his brother passed away.  He reports that each time that he is doing well he regularly attends meetings and then he stops going when he does not feel like he needs it anymore.  He reports wanting to go to rehab because he wants to do better.  He initially felt suicidal due to using cocaine and feeling like he "wanted his heart to stop."  He denies SI today.  He is future oriented and reports many reasons to live including his grandchildren.  He denies HI or AVH.  He denies problems with appetite.  He reports poor sleep in the setting of cocaine use.  He denies a history of manic symptoms (pressured speech, increased energy or decreased need for sleep).     Past Psychiatric History: Depression and alcohol abuse.  Risk to Self: None.  Denies HI. Risk to Others:  None.  Denies HI. Prior Inpatient Therapy:  He has a history of multiple admissions. He was last admitted in 02/2017 for worsening depression with  SI.  Prior Outpatient Therapy:  He was previously referred to [facility] and [facility] at discharge in 02/2017 from  [facility].   Past Medical History:  Past Medical History:  Diagnosis Date  . Cellulitis and abscess of foot 09/29/2016  . Chronic systolic heart failure ([place])   . Depression   . Diabetes mellitus without  complication ([place])   . Essential hypertension   . NICM (nonischemic cardiomyopathy) ([place]) EF 20-25%     Past Surgical History:  Procedure Laterality Date  . CARDIAC CATHETERIZATION N/A 04/30/2016   Procedure: Right/Left Heart Cath and Coronary Angiography;  Surgeon: Nelva Bush, MD;  Location: [facility];  Service: Cardiovascular;  Laterality: N/A;  . FINGER SURGERY     Family History:  Family History  Problem Relation Age of Onset  . Diabetes Other   . Hypertension Other   . Diabetes Mother   . Brain cancer Father   . Mental illness Brother   . Drug abuse Brother   . Diabetes Brother    Family Psychiatric  History: Denies Social History:  Social History   Substance and Sexual Activity  Alcohol Use No     Social History   Substance and Sexual Activity  Drug Use Yes  . Types: Cocaine, "Crack" cocaine    Social History   Socioeconomic History  . Marital status: Married    Spouse name: Not on file  . Number of children: Not on file  . Years of education: Not on file  . Highest education level: Not on file  Occupational History  . Not on file  Social Needs  . Financial resource strain: Not on file  . Food insecurity:    Worry: Not on file    Inability: Not on file  . Transportation needs:    Medical: Not on file    Non-medical: Not on file  Tobacco Use  . Smoking status: Current Every Day Smoker    Packs/day: 0.50    Years: 23.00    Pack years: 11.50    Types: Cigarettes  . Smokeless tobacco: Never Used  . Tobacco comment: Every other day   Substance and Sexual Activity  . Alcohol use: No  . Drug use: Yes    Types: Cocaine, "Crack" cocaine  . Sexual activity: Not Currently  Lifestyle  . Physical activity:    Days per week: Not on file    Minutes per session: Not on file  . Stress: Not on file  Relationships  . Social connections:    Talks on phone: Not on file    Gets together: Not on file    Attends religious service: Not on file     Active member of club or organization: Not on file    Attends meetings of clubs or organizations: Not on file    Relationship status: Not on file  Other Topics Concern  . Not on file  Social History Narrative  . Not on file   Additional Social History: He is from [place], [place].  He lives at home with his wife of 57 years.  He has 3 grown children.  He has 3 grandchildren.  He works in [employer].  He reports cocaine abuse.  His longest period of sobriety was 6 years.  He denies other illicit substance use.  He denies alcohol use.  He smokes 0.5 ppd.    Allergies:  No Known Allergies  Labs:  Results for orders placed or performed during the hospital  encounter of 07/12/17 (from the past 48 hour(s))  Comprehensive metabolic panel     Status: Abnormal   Collection Time: 07/12/17 12:05 AM  Result Value Ref Range   Sodium 126 (L) 135 - 145 mmol/L   Potassium 4.2 3.5 - 5.1 mmol/L   Chloride 89 (L) 101 - 111 mmol/L   CO2 23 22 - 32 mmol/L   Glucose, Bld 604 (HH) 65 - 99 mg/dL    Comment: CRITICAL RESULT CALLED TO, READ BACK BY AND VERIFIED WITH: PHILLIPS T,RN 07/12/17 0135 WAYK    BUN 17 6 - 20 mg/dL   Creatinine, Ser 1.14 0.61 - 1.24 mg/dL   Calcium 9.1 8.9 - 10.3 mg/dL   Total Protein 7.4 6.5 - 8.1 g/dL   Albumin 3.3 (L) 3.5 - 5.0 g/dL   AST 16 15 - 41 U/L   ALT 15 (L) 17 - 63 U/L   Alkaline Phosphatase 82 38 - 126 U/L   Total Bilirubin 0.6 0.3 - 1.2 mg/dL   GFR calc non Af Amer >60 >60 mL/min   GFR calc Af Amer >60 >60 mL/min    Comment: (NOTE) The eGFR has been calculated using the CKD EPI equation. This calculation has not been validated in all clinical situations. eGFR's persistently <60 mL/min signify possible Chronic Kidney Disease.    Anion gap 14 5 - 15    Comment: Performed at [facility] [address], [place], [place] [address]  Ethanol     Status: None   Collection Time: 07/12/17 12:05 AM  Result Value Ref Range   Alcohol, Ethyl (B) <10 <10 mg/dL     Comment:        LOWEST DETECTABLE LIMIT FOR SERUM ALCOHOL IS 10 mg/dL FOR MEDICAL PURPOSES ONLY Performed at [facility], [place] [address] [place], [place] [address]   Salicylate level     Status: None   Collection Time: 07/12/17 12:05 AM  Result Value Ref Range   Salicylate Lvl <4.7 2.8 - 30.0 mg/dL    Comment: Performed at [facility] [address], [place], [place] [address]  Acetaminophen level     Status: Abnormal   Collection Time: 07/12/17 12:05 AM  Result Value Ref Range   Acetaminophen (Tylenol), Serum <10 (L) 10 - 30 ug/mL    Comment:        THERAPEUTIC CONCENTRATIONS VARY SIGNIFICANTLY. A RANGE OF 10-30 ug/mL MAY BE AN EFFECTIVE CONCENTRATION FOR MANY PATIENTS. HOWEVER, SOME ARE BEST TREATED AT CONCENTRATIONS OUTSIDE THIS RANGE. ACETAMINOPHEN CONCENTRATIONS >150 ug/mL AT 4 HOURS AFTER INGESTION AND >50 ug/mL AT 12 HOURS AFTER INGESTION ARE OFTEN ASSOCIATED WITH TOXIC REACTIONS. Performed at [facility], [place] [address], [place], [place] [address]   cbc     Status: Abnormal   Collection Time: 07/12/17 12:05 AM  Result Value Ref Range   WBC 7.6 4.0 - 10.5 K/uL   RBC 4.38 4.22 - 5.81 MIL/uL   Hemoglobin 12.7 (L) 13.0 - 17.0 g/dL   HCT 37.8 (L) 39.0 - 52.0 %   MCV 86.3 78.0 - 100.0 fL   MCH 29.0 26.0 - 34.0 pg   MCHC 33.6 30.0 - 36.0 g/dL   RDW 14.1 11.5 - 15.5 %   Platelets 299 150 - 400 K/uL    Comment: Performed at [facility], [place] [address] [place], [place] [address]  Rapid urine drug screen (hospital performed)     Status: Abnormal   Collection Time:  07/12/17 12:09 AM  Result Value Ref Range   Opiates NONE DETECTED NONE DETECTED   Cocaine POSITIVE (A) NONE DETECTED   Benzodiazepines NONE DETECTED NONE DETECTED   Amphetamines NONE DETECTED NONE DETECTED   Tetrahydrocannabinol NONE DETECTED NONE DETECTED   Barbiturates NONE DETECTED NONE DETECTED    Comment: (NOTE) DRUG SCREEN FOR MEDICAL PURPOSES ONLY.  IF  CONFIRMATION IS NEEDED FOR ANY PURPOSE, NOTIFY LAB WITHIN 5 DAYS. LOWEST DETECTABLE LIMITS FOR URINE DRUG SCREEN Drug Class                     Cutoff (ng/mL) Amphetamine and metabolites    1000 Barbiturate and metabolites    200 Benzodiazepine                 562 Tricyclics and metabolites     300 Opiates and metabolites        300 Cocaine and metabolites        300 THC                            50 Performed at [facility], [place] [address], [place], [place] [address]   Strep pneumoniae urinary antigen     Status: None   Collection Time: 07/12/17 12:10 AM  Result Value Ref Range   Strep Pneumo Urinary Antigen NEGATIVE NEGATIVE    Comment:        Infection due to S. pneumoniae cannot be absolutely ruled out since the antigen present may be below the detection limit of the test. Performed at [facility], [address] [place], [place] [address]   CBG monitoring, ED     Status: Abnormal   Collection Time: 07/12/17  5:14 AM  Result Value Ref Range   Glucose-Capillary 413 (H) 65 - 99 mg/dL  Creatinine, serum     Status: None   Collection Time: 07/12/17  5:27 AM  Result Value Ref Range   Creatinine, Ser 1.04 0.61 - 1.24 mg/dL   GFR calc non Af Amer >60 >60 mL/min   GFR calc Af Amer >60 >60 mL/min    Comment: (NOTE) The eGFR has been calculated using the CKD EPI equation. This calculation has not been validated in all clinical situations. eGFR's persistently <60 mL/min signify possible Chronic Kidney Disease. Performed at [facility], [place] [address], [place], [place] [address]   Hemoglobin A1c     Status: Abnormal   Collection Time: 07/12/17  5:27 AM  Result Value Ref Range   Hgb A1c MFr Bld 14.6 (H) 4.8 - 5.6 %    Comment: (NOTE) Pre diabetes:          5.7%-6.4% Diabetes:              >6.4% Glycemic control for   <7.0% adults with diabetes    Mean Plasma Glucose 372.32 mg/dL    Comment: Performed at [facility] [address],  [place], [place] [address]  Vitamin B12     Status: Abnormal   Collection Time: 07/12/17  5:35 AM  Result Value Ref Range   Vitamin B-12 1,822 (H) 180 - 914 pg/mL    Comment: (NOTE) This assay is not validated for testing neonatal or myeloproliferative syndrome specimens for Vitamin B12 levels. Performed at [facility], [place] [address], [place], [place] [address]   Iron and TIBC     Status: Abnormal  Collection Time: 07/12/17  5:35 AM  Result Value Ref Range   Iron 19 (L) 45 - 182 ug/dL   TIBC 214 (L) 250 - 450 ug/dL   Saturation Ratios 9 (L) 17.9 - 39.5 %   UIBC 195 ug/dL    Comment: Performed at [facility] [address] [place], [place] [address]  Ferritin     Status: None   Collection Time: 07/12/17  5:35 AM  Result Value Ref Range   Ferritin 130 24 - 336 ng/mL    Comment: Performed at [facility], [place] [address], [place], [place] [address]  Reticulocytes     Status: Abnormal   Collection Time: 07/12/17  5:35 AM  Result Value Ref Range   Retic Ct Pct 0.4 0.4 - 3.1 %   RBC. 4.17 (L) 4.22 - 5.81 MIL/uL   Retic Count, Absolute 16.7 (L) 19.0 - 186.0 K/uL    Comment: Performed at [facility] [address], [place], [place] [address]  Troponin I     Status: None   Collection Time: 07/12/17  5:35 AM  Result Value Ref Range   Troponin I <0.03 <0.03 ng/mL    Comment: Performed at [facility] [address] [place], [place] [address]  CBC     Status: Abnormal   Collection Time: 07/12/17  5:35 AM  Result Value Ref Range   WBC 6.3 4.0 - 10.5 K/uL   RBC 4.17 (L) 4.22 - 5.81 MIL/uL   Hemoglobin 12.0 (L) 13.0 - 17.0 g/dL   HCT 35.6 (L) 39.0 - 52.0 %   MCV 85.4 78.0 - 100.0 fL   MCH 28.8 26.0 - 34.0 pg   MCHC 33.7 30.0 - 36.0 g/dL   RDW 13.8 11.5 - 15.5 %   Platelets 257 150 - 400 K/uL    Comment: Performed at [facility], [place] [address], [place], [place] [address]  Folate     Status: None   Collection Time: 07/12/17  5:35 AM  Result  Value Ref Range   Folate 9.3 >5.9 ng/mL    Comment: Performed at [facility] [address] [place], [place] [address]  CBG monitoring, ED     Status: Abnormal   Collection Time: 07/12/17  6:16 AM  Result Value Ref Range   Glucose-Capillary 362 (H) 65 - 99 mg/dL  Influenza panel by PCR (type A & B)     Status: None   Collection Time: 07/12/17  6:17 AM  Result Value Ref Range   Influenza A By PCR NEGATIVE NEGATIVE   Influenza B By PCR NEGATIVE NEGATIVE    Comment: (NOTE) The Xpert Xpress Flu assay is intended as an aid in the diagnosis of  influenza and should not be used as a sole basis for treatment.  This  assay is FDA approved for nasopharyngeal swab specimens only. Nasal  washings and aspirates are unacceptable for Xpert Xpress Flu testing. Performed at [facility], [place] [address] [place], [place] [address]   CBG monitoring, ED     Status: Abnormal   Collection Time: 07/12/17  7:29 AM  Result Value Ref Range   Glucose-Capillary 139 (H) 65 - 99 mg/dL  CBG monitoring, ED     Status: Abnormal   Collection Time: 07/12/17  8:51 AM  Result Value Ref Range   Glucose-Capillary 155 (H) 65 - 99 mg/dL  CBG monitoring, ED     Status: Abnormal  Collection Time: 07/12/17  9:41 AM  Result Value Ref Range   Glucose-Capillary 122 (H) 65 - 99 mg/dL  CBG monitoring, ED     Status: Abnormal   Collection Time: 07/12/17 10:43 AM  Result Value Ref Range   Glucose-Capillary 153 (H) 65 - 99 mg/dL  CBG monitoring, ED     Status: Abnormal   Collection Time: 07/12/17 11:25 AM  Result Value Ref Range   Glucose-Capillary 175 (H) 65 - 99 mg/dL   Comment 1 Notify RN     Current Facility-Administered Medications  Medication Dose Route Frequency Provider Last Rate Last Dose  . 0.9 %  sodium chloride infusion   Intravenous Continuous Rise Patience, MD 10 mL/hr at 07/12/17 0520    . 0.9 %  sodium chloride infusion   Intravenous Continuous Regalado, Belkys A, MD 75 mL/hr at  07/12/17 1131    . acetaminophen (TYLENOL) tablet 650 mg  650 mg Oral Q6H PRN Rise Patience, MD       Or  . acetaminophen (TYLENOL) suppository 650 mg  650 mg Rectal Q6H PRN Rise Patience, MD      . atorvastatin (LIPITOR) tablet 40 mg  40 mg Oral q1800 Rise Patience, MD      . azithromycin (ZITHROMAX) 500 mg in sodium chloride 0.9 % 250 mL IVPB  500 mg Intravenous Q24H Rise Patience, MD      . buPROPion (WELLBUTRIN XL) 24 hr tablet 150 mg  150 mg Oral Daily Rise Patience, MD   150 mg at 07/12/17 1130  . cefTRIAXone (ROCEPHIN) 1 g in sodium chloride 0.9 % 100 mL IVPB  1 g Intravenous Q24H Rise Patience, MD      . dextrose 50 % solution 25 mL  25 mL Intravenous PRN Rise Patience, MD      . enoxaparin (LOVENOX) injection 40 mg  40 mg Subcutaneous Daily Rise Patience, MD      . insulin aspart (novoLOG) injection 0-9 Units  0-9 Units Subcutaneous TID WC Regalado, Belkys A, MD      . insulin glargine (LANTUS) injection 15 Units  15 Units Subcutaneous Daily Regalado, Belkys A, MD   15 Units at 07/12/17 1156  . lisinopril (PRINIVIL,ZESTRIL) tablet 10 mg  10 mg Oral Daily Rise Patience, MD   10 mg at 07/12/17 1130  . traZODone (DESYREL) tablet 50 mg  50 mg Oral QHS PRN Rise Patience, MD       Current Outpatient Medications  Medication Sig Dispense Refill  . atorvastatin (LIPITOR) 40 MG tablet Take 1 tablet (40 mg total) by mouth daily. For high cholesterol 30 tablet 0  . buPROPion (WELLBUTRIN XL) 150 MG 24 hr tablet Take 1 tablet (150 mg total) by mouth daily. For depression 30 tablet 0  . carvedilol (COREG) 25 MG tablet Take 1 tablet (25 mg total) by mouth 2 (two) times daily with a meal. 60 tablet 0  . furosemide (LASIX) 20 MG tablet Take 1 tablet (20 mg total) by mouth daily. For swellings 30 tablet 0  . guaiFENesin (MUCINEX) 600 MG 12 hr tablet Take 600 mg by mouth 2 (two) times daily as needed for cough.    . insulin aspart  protamine- aspart (NOVOLOG MIX 70/30) (70-30) 100 UNIT/ML injection Inject 0.1 mLs (10 Units total) into the skin 2 (two) times daily with a meal. For diabetes management (Patient taking differently: Inject 10 Units into the skin 3 (three) times daily  before meals. Per sliding scale For diabetes management) 10 mL 0  . insulin glargine (LANTUS) 100 UNIT/ML injection Inject 0.15 mLs (15 Units total) into the skin at bedtime. For diabetes management (Patient taking differently: Inject 15-20 Units into the skin at bedtime. For diabetes management) 10 mL 0  . Insulin Syringe-Needle U-100 (INSULIN SYRINGE .3CC/31GX5/16") 31G X 5/16" 0.3 ML MISC Use with lantus, once daily: For diabetes management 100 each 0  . lisinopril (PRINIVIL,ZESTRIL) 20 MG tablet Take 20 mg by mouth daily.    . metFORMIN (GLUCOPHAGE) 1000 MG tablet Take 1 tablet (1,000 mg total) by mouth 2 (two) times daily with a meal. For diabetes management 60 tablet 0  . spironolactone (ALDACTONE) 25 MG tablet Take 1 tablet (25 mg total) by mouth daily. For high blood pressure 30 tablet 0  . traZODone (DESYREL) 50 MG tablet Take 1 tablet (50 mg total) by mouth at bedtime as needed for sleep. 30 tablet 0    Musculoskeletal: Strength & Muscle Tone: within normal limits Gait & Station: UTA since patient was lying in bed. Patient leans: N/A  Psychiatric Specialty Exam: Physical Exam  Nursing note and vitals reviewed. Constitutional: He is oriented to person, place, and time. He appears well-developed and well-nourished.  HENT:  Head: Normocephalic and atraumatic.  Neck: Normal range of motion.  Respiratory: Effort normal.  Musculoskeletal: Normal range of motion.  Neurological: He is alert and oriented to person, place, and time.  Psychiatric: His speech is normal and behavior is normal. Thought content normal. Cognition and memory are normal. He expresses impulsivity. He exhibits a depressed mood.    Review of Systems  Constitutional:  Positive for chills. Negative for fever.  Respiratory: Positive for cough.   Cardiovascular: Negative for chest pain.  Gastrointestinal: Negative for abdominal pain, constipation, diarrhea, nausea and vomiting.  Psychiatric/Behavioral: Positive for depression and substance abuse. Negative for hallucinations and suicidal ideas. The patient has insomnia. The patient is not nervous/anxious.   All other systems reviewed and are negative.   Blood pressure (!) 153/119, pulse (!) 57, temperature 98.4 F (36.9 C), temperature source Oral, resp. rate 16, height 6' (1.829 m), weight 65.8 kg (145 lb), SpO2 99 %.Body mass index is 19.67 kg/m.  General Appearance: Fairly Groomed, thin, middle aged, African American male, wearing paper hospital scrubs with a bald head and lying in bed. NAD.   Eye Contact:  Good  Speech:  Clear and Coherent and Normal Rate  Volume:  Normal  Mood:  Depressed  Affect:  Constricted  Thought Process:  Goal Directed, Linear and Descriptions of Associations: Intact  Orientation:  Full (Time, Place, and Person)  Thought Content:  Logical  Suicidal Thoughts:  No  Homicidal Thoughts:  No  Memory:  Immediate;   Good Recent;   Good Remote;   Good  Judgement:  Fair  Insight:  Fair  Psychomotor Activity:  Normal  Concentration:  Concentration: Good and Attention Span: Good  Recall:  Good  Fund of Knowledge:  Good  Language:  Good  Akathisia:  Yes  Handed:  Right  AIMS (if indicated):   N/A  Assets:  Communication Skills Desire for Improvement Financial Resources/Insurance Housing Intimacy Social Support  ADL's:  Intact  Cognition:  WNL  Sleep:   Poor   Assessment:  JUSTINE COSSIN is a 57 y.o. male admitted with CPAP.  He endorsed SI on admission due to frustration about relapsing on cocaine use in the setting losing his brother.  He denies  SI today.  He denies HI or AVH.  He is future oriented and reports that he is interested in rehab treatment for cocaine use.   Recommend unit SW assist patient with rehab resources.   Treatment Plan Summary: -Restart Wellbutrin 150 mg daily for depression. -Please have unit SW provide patient with resources for cocaine abuse.  He is interested in inpatient rehab. -Patient is psychiatrically cleared.  Psychiatry will sign off on patient at this time.  Please consult psychiatry again as needed.  Disposition: No evidence of imminent risk to self or others at present.   Patient does not meet criteria for psychiatric inpatient admission. Supportive therapy provided about ongoing stressors.  Faythe Dingwall, DO 07/12/2017 12:59 PM

## 2017-07-12 NOTE — Progress Notes (Signed)
Inpatient Diabetes Program Recommendations  AACE/ADA: New Consensus Statement on Inpatient Glycemic Control (2015)  Target Ranges:  Prepandial:   less than 140 mg/dL      Peak postprandial:   less than 180 mg/dL (1-2 hours)      Critically ill patients:  140 - 180 mg/dL   Lab Results  Component Value Date   GLUCAP 122 (H) 07/12/2017   HGBA1C 14.6 (H) 07/12/2017    Review of Glycemic Control  Diabetes history: DM 2 Outpatient Diabetes medications: 70/30 10 units tid, Lantus 15-20 units qhs, Metformin 1000 mg bid Current orders for Inpatient glycemic control: Lantus 15 units daily, Novolog Sensitive Correction 0-9 units tid  Inpatient Diabetes Program Recommendations:    Tried to speak with patient regarding his A1c level 14.6%. Patient very lethargic. Patient admits to not taking care of himself when "he is on that stuff." Patient reports using cocaine and that it usually makes him "crash" like this being very lethargic. Patient reports being interested in getting better at taking care of himself. Spoke with patient about how cocaine raises glucose. Spoke to him about how uncontrolled Diabetes can cause complications with circulation and make him more at risk for heart attack, stroke, and other complications. Patient fell asleep pretty quick. Will try to revisit this admission. Patient does have SI and polysubstance abuse that would be barriers to receptive education at this time.   Thanks,  Christena Deem RN, MSN, BC-ADM, [facility] Inpatient Diabetes Coordinator Team Pager [phone] (8a-5p)

## 2017-07-12 NOTE — ED Notes (Addendum)
Report given to Lurena Joiner, RN; SWAT to transport.

## 2017-07-12 NOTE — ED Notes (Signed)
Staffing aware of sitter need

## 2017-07-12 NOTE — Progress Notes (Addendum)
Patient seen and examined. HIP reviewed. patient presents with cough, productive, suicidal thought. Found to have hyperglycemia. He has not been using regularly his insulin.  Exam; general; no acute distress. Lungs; Bilateral ronchus. Abdomen soft, NT A/P  1-PNA; continue with IV antibiotics.  2-Depression, suicidal; PSych consulted.  3-DM, Hyperglycemia; transition from insulin Gtt to lantus. Will also order SSI.  4-Anemia, iron deficiency; start ferrous sulfate.  5-HTN; Lisinopril. PRN Hydralazine.  6-hyponatremia, plus pseudohyponatremia due to hyperglycemia. Repeat bmet. Iv fluid.s  Hartley Barefoot, MD.  [phone]

## 2017-07-12 NOTE — ED Notes (Signed)
Meal tray delivered.

## 2017-07-12 NOTE — H&P (Signed)
History and Physical    James [name] ZOX:[id number] DOB: 1960-09-16 DOA: 07/12/2017  PCP: Lavinia Sharps, NP  Patient coming from: Home.  Chief Complaint: Cough.  HPI: James [name] is a 57 y.o. 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  Patient states he has not been compliant with his medications for last few days since he ran out of it.  Denies any chest pain fever chills nausea vomiting abdominal pain or diarrhea.  Patient also states that he has been having suicidal thoughts but has not had any plans.  ED Course: In the ER chest x-ray shows features concerning for pneumonia.  EKG shows sinus tachycardia.  Urine drug screen is positive for cocaine.  Blood sugar was more than 600 with a anion gap of 14.  Patient started on IV insulin infusion and antibiotics admitted for further management.  Will need psychiatric consult for suicidal ideation.  Review of Systems: As per HPI, rest all negative.   Past Medical History:  Diagnosis Date  . Cellulitis and abscess of foot 09/29/2016  . Chronic systolic heart failure (HCC)   . Depression   . Diabetes mellitus without complication (HCC)   . Essential hypertension   . NICM (nonischemic cardiomyopathy) (HCC) EF 20-25%     Past Surgical History:  Procedure Laterality Date  . CARDIAC CATHETERIZATION N/A 04/30/2016   Procedure: Right/Left Heart Cath and Coronary Angiography;  Surgeon: Yvonne Kendall, MD;  Location: [facility] INVASIVE CV LAB;  Service: Cardiovascular;  Laterality: N/A;  . FINGER SURGERY       reports that he has been smoking cigarettes.  He has a 11.50 pack-year smoking history. He has never used smokeless  tobacco. He reports that he has current or past drug history. Drugs: Cocaine and "Crack" cocaine. He reports that he does not drink alcohol.  No Known Allergies  Family History  Problem Relation Age of Onset  . Diabetes Other   . Hypertension Other   . Diabetes Mother   . Brain cancer Father   . Mental illness Brother   . Drug abuse Brother   . Diabetes Brother     Prior to Admission medications   Medication Sig Start Date End Date Taking? Authorizing Provider  atorvastatin (LIPITOR) 40 MG tablet Take 1 tablet (40 mg total) by mouth daily. For high cholesterol 01/18/17   Nwoko, Nicole Kindred I, NP  buPROPion (WELLBUTRIN XL) 150 MG 24 hr tablet Take 1 tablet (150 mg total) by mouth daily. For depression 01/19/17   Armandina Stammer I, NP  carvedilol (COREG) 25 MG tablet Take 1 tablet (25 mg total) by mouth 2 (two) times daily with a meal. 01/18/17   Nwoko, Nicole Kindred I, NP  furosemide (LASIX) 20 MG tablet Take 1 tablet (20 mg total) by mouth daily. For swellings 01/18/17   Armandina Stammer I, NP  insulin aspart protamine- aspart (NOVOLOG MIX 70/30) (70-30) 100 UNIT/ML injection Inject 0.1 mLs (10 Units total) into the skin 2 (two) times daily with a meal. For diabetes management 01/18/17   Armandina Stammer I, NP  insulin glargine (LANTUS) 100 UNIT/ML injection Inject 0.15 mLs (15 Units total) into the skin at bedtime. For diabetes management 01/18/17   Armandina Stammer I,  NP  Insulin Syringe-Needle U-100 (INSULIN SYRINGE .3CC/31GX5/16") 31G X 5/16" 0.3 ML MISC Use with lantus, once daily: For diabetes management 01/18/17   Armandina Stammer I, NP  metFORMIN (GLUCOPHAGE) 1000 MG tablet Take 1 tablet (1,000 mg total) by mouth 2 (two) times daily with a meal. For diabetes management 01/18/17   Armandina Stammer I, NP  spironolactone (ALDACTONE) 25 MG tablet Take 1 tablet (25 mg total) by mouth daily. For high blood pressure 01/19/17   Nwoko, Nicole Kindred I, NP  traZODone (DESYREL) 50 MG tablet Take 1 tablet (50 mg total) by mouth at bedtime as  needed for sleep. 01/18/17   Armandina Stammer I, NP    Physical Exam: Vitals:   07/11/17 2349 07/11/17 2353 07/12/17 0408  BP: (!) 144/82  (!) 145/66  Pulse: (!) 114  (!) 103  Resp: 18  (!) 21  Temp: 98.7 F (37.1 C)  98.4 F (36.9 C)  TempSrc: Oral  Oral  SpO2: 97%  96%  Weight:  65.8 kg (145 lb)   Height:  6' (1.829 m)       Constitutional: Moderately built and nourished. Vitals:   07/11/17 2349 07/11/17 2353 07/12/17 0408  BP: (!) 144/82  (!) 145/66  Pulse: (!) 114  (!) 103  Resp: 18  (!) 21  Temp: 98.7 F (37.1 C)  98.4 F (36.9 C)  TempSrc: Oral  Oral  SpO2: 97%  96%  Weight:  65.8 kg (145 lb)   Height:  6' (1.829 m)    Eyes: Anicteric no pallor. ENMT: No discharge from the ears eyes nose or mouth. Neck: No mass palpated no JVD appreciated. Respiratory: No rhonchi or crepitations. Cardiovascular: S1-S2 heard no murmurs appreciated. Abdomen: Soft nontender bowel sounds present. Musculoskeletal: No edema.  No joint effusion. Skin: No rash.  Skin appears warm. Neurologic: Alert awake oriented to time place and person.  Moves all extremities. Psychiatric: Appears normal with normal affect.   Labs on Admission: I have personally reviewed following labs and imaging studies  CBC: Recent Labs  Lab 07/12/17 0005  WBC 7.6  HGB 12.7*  HCT 37.8*  MCV 86.3  PLT 299   Basic Metabolic Panel: Recent Labs  Lab 07/12/17 0005  NA 126*  K 4.2  CL 89*  CO2 23  GLUCOSE 604*  BUN 17  CREATININE 1.14  CALCIUM 9.1   GFR: Estimated Creatinine Clearance: 64.9 mL/min (by C-G formula based on SCr of 1.14 mg/dL). Liver Function Tests: Recent Labs  Lab 07/12/17 0005  AST 16  ALT 15*  ALKPHOS 82  BILITOT 0.6  PROT 7.4  ALBUMIN 3.3*   No results for input(s): LIPASE, AMYLASE in the last 168 hours. No results for input(s): AMMONIA in the last 168 hours. Coagulation Profile: No results for input(s): INR, PROTIME in the last 168 hours. Cardiac Enzymes: No results  for input(s): CKTOTAL, CKMB, CKMBINDEX, TROPONINI in the last 168 hours. BNP (last 3 results) No results for input(s): PROBNP in the last 8760 hours. HbA1C: No results for input(s): HGBA1C in the last 72 hours. CBG: No results for input(s): GLUCAP in the last 168 hours. Lipid Profile: No results for input(s): CHOL, HDL, LDLCALC, TRIG, CHOLHDL, LDLDIRECT in the last 72 hours. Thyroid Function Tests: No results for input(s): TSH, T4TOTAL, FREET4, T3FREE, THYROIDAB in the last 72 hours. Anemia Panel: No results for input(s): VITAMINB12, FOLATE, FERRITIN, TIBC, IRON, RETICCTPCT in the last 72 hours. Urine analysis:    Component Value Date/Time   COLORURINE STRAW (A)  02/23/2017 0657   APPEARANCEUR CLEAR 02/23/2017 0657   LABSPEC 1.028 02/23/2017 0657   PHURINE 5.0 02/23/2017 0657   GLUCOSEU >=500 (A) 02/23/2017 0657   HGBUR NEGATIVE 02/23/2017 0657   HGBUR negative 08/06/2008 1124   BILIRUBINUR NEGATIVE 02/23/2017 0657   KETONESUR NEGATIVE 02/23/2017 0657   PROTEINUR NEGATIVE 02/23/2017 0657   UROBILINOGEN 1.0 12/06/2012 1637   NITRITE NEGATIVE 02/23/2017 0657   LEUKOCYTESUR NEGATIVE 02/23/2017 0657   Sepsis Labs: @LABRCNTIP (procalcitonin:4,lacticidven:4) )No results found for this or any previous visit (from the past 240 hour(s)).   Radiological Exams on Admission: Dg Chest 2 View  Result Date: 07/12/2017 CLINICAL DATA:  57 year old male with cough. EXAM: CHEST - 2 VIEW COMPARISON:  Chest radiograph dated 04/30/2016 FINDINGS: Small bibasilar nodular densities concerning for pneumonia. Clinical correlation is recommended. There is no focal consolidation, pleural effusion, or pneumothorax. The cardiac silhouette is within normal limits. No acute osseous pathology. IMPRESSION: Bibasilar nodular densities concerning for pneumonia. Clinical correlation is recommended. No focal consolidation. Electronically Signed   By: Elgie Collard M.D.   On: 07/12/2017 00:34    EKG: Independently  reviewed.  Sinus tachycardia with LVH changes.  Poor R wave progression.  Assessment/Plan Active Problems:   Essential hypertension   CAP (community acquired pneumonia)   Uncontrolled type 2 diabetes mellitus with hyperglycemia (HCC)   Cardiomyopathy- suspect NICM but etiology not yet determined   Polysubstance abuse (HCC)   Suicidal ideations    1. Community-acquired pneumonia -patient has been placed on ceftriaxone and Zithromax.  Check urine for Legionella strep antigen check influenza PCR sputum cultures blood cultures. 2. Uncontrolled diabetes mellitus type 2 with hyperglycemia -patient has been started on insulin infusion closely follow metabolic panel to make sure patient is not going into diabetic ketoacidosis.  Check hemoglobin A1c.  Uncontrolled diabetes secondary to noncompliance with medications. 3. Nonischemic myopathy last EF measured in July 2018 was 55-60% prior to that patient had 20-35% EF.  Patient has not been taking his medications presently.  We will hold Coreg for now since patient is cocaine positive.  Will restart lisinopril at a lower dose of 10 mg. 4. Suicidal thoughts -consult psychiatry in a.m.  Patient on suicide precautions. 5. Normocytic normochromic anemia -follow CBC. 6. Polysubstance abuse -will need counseling.   DVT prophylaxis: Lovenox. Code Status: Full code. Family Communication: Discussed with patient. Disposition Plan: Home. Consults called: None. Admission status: Observation.   Eduard Clos MD [facility] Pager [phone].  If 7PM-7AM, please contact night-coverage [email or web address] Password [email or web address]  07/12/2017, 5:09 AM

## 2017-07-12 NOTE — Care Management Note (Signed)
Case Management Note  Patient Details  Name: TYDUS OPDYKE MRN: [id number] Date of Birth: 1960/07/05  Subjective/Objective:                 CM consult for medication needs. Patient noted to have PCP listed as Lavinia Sharps at [facility]. No insurance listed. Patient has 3 admissions in last 6 months and is a high risk for readmission. CSW consulted for substance abuse. CM will continue to follow for treatment plan and discharge needs.    Action/Plan:   Expected Discharge Date:                  Expected Discharge Plan:     In-House Referral:     Discharge planning Services  CM Consult  Post Acute Care Choice:    Choice offered to:     DME Arranged:    DME Agency:     HH Arranged:    HH Agency:     Status of Service:  In process, will continue to follow  If discussed at Long Length of Stay Meetings, dates discussed:    Additional Comments:  Lawerance Sabal, RN 07/12/2017, 4:54 PM

## 2017-07-12 NOTE — ED Notes (Signed)
Pharmacy called to verify and send lantus

## 2017-07-13 DIAGNOSIS — I429 Cardiomyopathy, unspecified: Secondary | ICD-10-CM | POA: Diagnosis not present

## 2017-07-13 DIAGNOSIS — F1410 Cocaine abuse, uncomplicated: Secondary | ICD-10-CM

## 2017-07-13 DIAGNOSIS — J189 Pneumonia, unspecified organism: Secondary | ICD-10-CM | POA: Diagnosis not present

## 2017-07-13 DIAGNOSIS — R45851 Suicidal ideations: Secondary | ICD-10-CM

## 2017-07-13 DIAGNOSIS — R739 Hyperglycemia, unspecified: Secondary | ICD-10-CM | POA: Diagnosis not present

## 2017-07-13 DIAGNOSIS — I10 Essential (primary) hypertension: Secondary | ICD-10-CM

## 2017-07-13 DIAGNOSIS — J181 Lobar pneumonia, unspecified organism: Secondary | ICD-10-CM | POA: Diagnosis not present

## 2017-07-13 DIAGNOSIS — F1910 Other psychoactive substance abuse, uncomplicated: Secondary | ICD-10-CM

## 2017-07-13 LAB — BASIC METABOLIC PANEL
Anion gap: 12 (ref 5–15)
BUN: 9 mg/dL (ref 6–20)
CALCIUM: 8.3 mg/dL — AB (ref 8.9–10.3)
CHLORIDE: 96 mmol/L — AB (ref 101–111)
CO2: 24 mmol/L (ref 22–32)
CREATININE: 0.94 mg/dL (ref 0.61–1.24)
GFR calc non Af Amer: >60 mL/min (ref >60)
Glucose, Bld: 330 mg/dL — ABNORMAL HIGH (ref 65–99)
Potassium: 3.6 mmol/L (ref 3.5–5.1)
Sodium: 132 mmol/L — ABNORMAL LOW (ref 135–145)

## 2017-07-13 LAB — CBC
HCT: 35 % — ABNORMAL LOW (ref 39.0–52.0)
Hemoglobin: 11.6 g/dL — ABNORMAL LOW (ref 13.0–17.0)
MCH: 28.3 pg (ref 26.0–34.0)
MCHC: 33.1 g/dL (ref 30.0–36.0)
MCV: 85.4 fL (ref 78.0–100.0)
PLATELETS: 286 10*3/uL (ref 150–400)
RBC: 4.1 MIL/uL — AB (ref 4.22–5.81)
RDW: 13.8 % (ref 11.5–15.5)
WBC: 7.8 10*3/uL (ref 4.0–10.5)

## 2017-07-13 LAB — GLUCOSE, CAPILLARY
Glucose-Capillary: 290 mg/dL — ABNORMAL HIGH (ref 65–99)
Glucose-Capillary: 233 mg/dL — ABNORMAL HIGH (ref 65–99)
Glucose-Capillary: 313 mg/dL — ABNORMAL HIGH (ref 65–99)
Glucose-Capillary: 137 mg/dL — ABNORMAL HIGH (ref 65–99)

## 2017-07-13 MED ORDER — POLYETHYLENE GLYCOL 3350 17 G PO PACK
17.0000 g | PACK | Freq: Every day | ORAL | Status: DC
  Filled 2017-07-13: qty 1

## 2017-07-13 MED ORDER — INSULIN GLARGINE 100 UNIT/ML ~~LOC~~ SOLN: 15.0000 [IU] | Freq: Every day | SUBCUTANEOUS | 0 refills | Status: DC

## 2017-07-13 MED ORDER — ATORVASTATIN CALCIUM 40 MG PO TABS: 40.0000 mg | ORAL_TABLET | Freq: Every day | ORAL | 0 refills | Status: DC

## 2017-07-13 MED ORDER — CEFPODOXIME PROXETIL 200 MG PO TABS: 200.0000 mg | ORAL_TABLET | Freq: Two times a day (BID) | ORAL | 0 refills | Status: AC

## 2017-07-13 MED ORDER — "INSULIN SYRINGE 31G X 5/16"" 0.3 ML MISC": 0 refills | Status: DC

## 2017-07-13 MED ORDER — LISINOPRIL 20 MG PO TABS: 20.0000 mg | ORAL_TABLET | Freq: Every day | ORAL | 0 refills | Status: DC

## 2017-07-13 MED ORDER — AZITHROMYCIN 500 MG PO TABS: 500.0000 mg | ORAL_TABLET | Freq: Every day | ORAL | 0 refills | Status: AC

## 2017-07-13 MED ORDER — FERROUS SULFATE 325 (65 FE) MG PO TABS: 325.0000 mg | ORAL_TABLET | Freq: Two times a day (BID) | ORAL | 0 refills | Status: DC

## 2017-07-13 MED ORDER — AZITHROMYCIN 250 MG PO TABS
500.0000 mg | ORAL_TABLET | Freq: Every day | ORAL | Status: DC
  Administered 2017-07-13: 500 mg via ORAL
  Filled 2017-07-13: qty 2

## 2017-07-13 MED ORDER — BUPROPION HCL ER (XL) 150 MG PO TB24: 150.0000 mg | ORAL_TABLET | Freq: Every day | ORAL | 0 refills | Status: DC

## 2017-07-13 MED ORDER — METFORMIN HCL 1000 MG PO TABS: 1000.0000 mg | ORAL_TABLET | Freq: Two times a day (BID) | ORAL | 0 refills | Status: DC

## 2017-07-13 MED FILL — FERROUS SULFATE 325 MG TAB: 325 (65 FE) | 30 days supply | Qty: 60 | Fill #0

## 2017-07-13 MED FILL — AZITHROMYCIN 500 MG TABLET: 500 | 1 days supply | Qty: 1 | Fill #0

## 2017-07-13 MED FILL — LISINOPRIL 20 MG TAB: 20 | 30 days supply | Qty: 30 | Fill #0

## 2017-07-13 MED FILL — CEFPODOXIME 200 MG TABLET: 200 | 5 days supply | Qty: 10 | Fill #0

## 2017-07-13 MED FILL — metFORMIN HCL 1000 MG TABS: 1000 | 30 days supply | Qty: 60 | Fill #0

## 2017-07-13 MED FILL — LANTUS 100 UNITS/ML VIAL: 100 | 28 days supply | Qty: 10 | Fill #0

## 2017-07-13 MED FILL — ATORVASTATIN CALCIUM 40 MG: 40 | 30 days supply | Qty: 30 | Fill #0

## 2017-07-13 NOTE — Care Management Note (Signed)
Case Management Note  Patient Details  Name: James [name] MRN: [id number] Date of Birth: 1960/11/21  Subjective/Objective:                    Action/Plan: Plan is for patient to d/c in am and be transported via cab to drug rehab. CSW has provided cab voucher. CM was able to obtain him medications through [facility] pharmacy and gave them to the bedside RN. Patient has to bring his own medications to the facility. No further needs per CM.   Expected Discharge Date:                  Expected Discharge Plan:  OP Rehab(Drug rehab)  In-House Referral:  Clinical Social Work  Discharge planning Services  CM Consult, Medication Assistance  Post Acute Care Choice:    Choice offered to:     DME Arranged:    DME Agency:     HH Arranged:    HH Agency:     Status of Service:  Completed, signed off  If discussed at [employer], dates discussed:    Additional Comments:  Kermit Balo, RN 07/13/2017, 4:04 PM

## 2017-07-13 NOTE — Progress Notes (Signed)
PROGRESS NOTE    James [name]  XID:[id number] DOB: 1960/06/04 DOA: 07/12/2017 PCP: Lavinia Sharps, NP   Brief Narrative: James [name] is a 57 y.o. male with a history of non-ischemic cardiomyopathy, DM2, polysubstance abuse, depression. He presented with cough and found to have pneumonia.   Assessment & Plan:   Principal Problem:   Cocaine abuse (HCC) Active Problems:   Essential hypertension   CAP (community acquired pneumonia)   Uncontrolled type 2 diabetes mellitus with hyperglycemia (HCC)   Cardiomyopathy- suspect NICM but etiology not yet determined   Polysubstance abuse (HCC)   Suicidal ideations   Community acquired pneumonia Blood cultures no growth to date. Afebrile. -Continue Ceftriaxone/Azithromycin with plan to switch to Vantin/Azithromycin on discharge -Blood cultures  Diabetes mellitus, type 2 Hyperglycemia. -Continue Lantus and SSI  Nonischemic myopathy Last EF of 55-60% which is an improvement from prior EF. -Discontinue Coreg in setting of cocaine use -Continue lisinopril  Suicidal ideation Depression Seen by psychiatry. -Psychiatry recommendations: Restarted Wellbutrin 150 mg daily. Does not meet inpatient criteria  Normocytic anemia Iron low on anemia panel. -Continue iron supplementation  Polysubstance abuse Social work consulted. Plan for outpatient resources on discharge.   DVT prophylaxis: Lovenox Code Status:   Code Status: Full Code Family Communication: None at bedside Disposition Plan: Discharge likely in 24 hours   Consultants:   Psychiatry  Procedures:   None  Antimicrobials:  Ceftriaxone  Azithromycin    Subjective: Patient reports feeling better. States if he is discharged, he will likely use cocaine again. When he uses cocaine, he develops his thoughts of self harm  Objective: Vitals:   07/12/17 2329 07/13/17 0406 07/13/17 0843 07/13/17 1153  BP: 137/85 (!) 140/97 (!) 144/67 (!) 144/81  Pulse: (!)  108 (!) 103 (!) 55 (!) 102  Resp: 18 18 (!) 26   Temp: 99.2 F (37.3 C) 98.2 F (36.8 C) 98.8 F (37.1 C) 98.2 F (36.8 C)  TempSrc: Oral Oral Oral Oral  SpO2: 95%  97% 97%  Weight:      Height:        Intake/Output Summary (Last 24 hours) at 07/13/2017 1338 Last data filed at 07/13/2017 1100 Gross per 24 hour  Intake 2341.93 ml  Output 400 ml  Net 1941.93 ml   Filed Weights   07/11/17 2353 07/12/17 1603  Weight: 65.8 kg (145 lb) 65.7 kg (144 lb 13.5 oz)    Examination:  General exam: Appears calm and comfortable Respiratory system: Clear to auscultation. Respiratory effort normal. Cardiovascular system: S1 & S2 heard, RRR. No murmurs, rubs, gallops or clicks. Gastrointestinal system: Abdomen is nondistended, soft and nontender. No organomegaly or masses felt. Normal bowel sounds heard. Central nervous system: Alert and oriented. No focal neurological deficits. Extremities: No edema. No calf tenderness Skin: No cyanosis. No rashes Psychiatry: Judgement and insight appear normal. Depressed mood, flat affect. No current suicidal ideation, however, concern he will develop such ideation if he uses cocaine    Data Reviewed: I have personally reviewed following labs and imaging studies  CBC: Recent Labs  Lab 07/12/17 0005 07/12/17 0535 07/13/17 0343  WBC 7.6 6.3 7.8  HGB 12.7* 12.0* 11.6*  HCT 37.8* 35.6* 35.0*  MCV 86.3 85.4 85.4  PLT 299 257 286   Basic Metabolic Panel: Recent Labs  Lab 07/12/17 0005 07/12/17 0527 07/12/17 1439 07/13/17 0343  NA 126*  --  130* 132*  K 4.2  --  3.8 3.6  CL 89*  --  94* 96*  CO2 23  --  25 24  GLUCOSE 604*  --  385* 330*  BUN 17  --  13 9  CREATININE 1.14 1.04 0.92 0.94  CALCIUM 9.1  --  8.4* 8.3*   GFR: Estimated Creatinine Clearance: 78.6 mL/min (by C-G formula based on SCr of 0.94 mg/dL). Liver Function Tests: Recent Labs  Lab 07/12/17 0005  AST 16  ALT 15*  ALKPHOS 82  BILITOT 0.6  PROT 7.4  ALBUMIN 3.3*   No  results for input(s): LIPASE, AMYLASE in the last 168 hours. No results for input(s): AMMONIA in the last 168 hours. Coagulation Profile: No results for input(s): INR, PROTIME in the last 168 hours. Cardiac Enzymes: Recent Labs  Lab 07/12/17 0535  TROPONINI <0.03   BNP (last 3 results) No results for input(s): PROBNP in the last 8760 hours. HbA1C: Recent Labs    07/12/17 0527  HGBA1C 14.6*   CBG: Recent Labs  Lab 07/12/17 1332 07/12/17 1622 07/12/17 2208 07/13/17 0721 07/13/17 1144  GLUCAP 161* 351* 346* 290* 233*   Lipid Profile: No results for input(s): CHOL, HDL, LDLCALC, TRIG, CHOLHDL, LDLDIRECT in the last 72 hours. Thyroid Function Tests: No results for input(s): TSH, T4TOTAL, FREET4, T3FREE, THYROIDAB in the last 72 hours. Anemia Panel: Recent Labs    07/12/17 0535  VITAMINB12 1,822*  FOLATE 9.3  FERRITIN 130  TIBC 214*  IRON 19*  RETICCTPCT 0.4   Sepsis Labs: No results for input(s): PROCALCITON, LATICACIDVEN in the last 168 hours.  Recent Results (from the past 240 hour(s))  Culture, blood (routine x 2) Call MD if unable to obtain prior to antibiotics being given     Status: None (Preliminary result)   Collection Time: 07/12/17  5:20 AM  Result Value Ref Range Status   Specimen Description BLOOD RIGHT ANTECUBITAL  Final   Special Requests   Final    IN PEDIATRIC BOTTLE Blood Culture results may not be optimal due to an excessive volume of blood received in culture bottles   Culture   Final    NO GROWTH 1 DAY Performed at [facility], [address] [place], [place] [address]    Report Status PENDING  Incomplete  Culture, blood (routine x 2) Call MD if unable to obtain prior to antibiotics being given     Status: None (Preliminary result)   Collection Time: 07/12/17  5:30 AM  Result Value Ref Range Status   Specimen Description BLOOD RIGHT FOREARM  Final   Special Requests   Final    BOTTLES DRAWN AEROBIC AND ANAEROBIC Blood Culture  adequate volume   Culture   Final    NO GROWTH 1 DAY Performed at [facility], [address], [place], [place] [address]    Report Status PENDING  Incomplete  MRSA PCR Screening     Status: None   Collection Time: 07/12/17  3:55 PM  Result Value Ref Range Status   MRSA by PCR NEGATIVE NEGATIVE Final    Comment:        The GeneXpert MRSA Assay (FDA approved for NASAL specimens only), is one component of a comprehensive MRSA colonization surveillance program. It is not intended to diagnose MRSA infection nor to guide or monitor treatment for MRSA infections. Performed at [facility], [address] [place], [place] [address]          Radiology Studies: Dg Chest 2 View  Result Date: 07/12/2017 CLINICAL DATA:  57 year old male with cough.  EXAM: CHEST - 2 VIEW COMPARISON:  Chest radiograph dated 04/30/2016 FINDINGS: Small bibasilar nodular densities concerning for pneumonia. Clinical correlation is recommended. There is no focal consolidation, pleural effusion, or pneumothorax. The cardiac silhouette is within normal limits. No acute osseous pathology. IMPRESSION: Bibasilar nodular densities concerning for pneumonia. Clinical correlation is recommended. No focal consolidation. Electronically Signed   By: Elgie Collard M.D.   On: 07/12/2017 00:34        Scheduled Meds: . atorvastatin  40 mg Oral q1800  . azithromycin  500 mg Oral QHS  . buPROPion  150 mg Oral Daily  . enoxaparin (LOVENOX) injection  40 mg Subcutaneous Daily  . ferrous sulfate  325 mg Oral BID WC  . insulin aspart  0-9 Units Subcutaneous TID WC  . insulin glargine  15 Units Subcutaneous Daily  . lisinopril  10 mg Oral Daily  . pneumococcal 23 valent vaccine  0.5 mL Intramuscular Tomorrow-1000  . polyethylene glycol  17 g Oral Daily   Continuous Infusions: . sodium chloride Stopped (07/12/17 1502)  . sodium chloride 75 mL/hr at 07/13/17 0301  . cefTRIAXone (ROCEPHIN)  IV Stopped  (07/12/17 2050)     LOS: 0 days     Jacquelin Hawking, MD [facility] 07/13/2017, 1:38 PM Pager: [phone]  If 7PM-7AM, please contact night-coverage [email or web address] Password [email or web address] 07/13/2017, 1:38 PM

## 2017-07-13 NOTE — Progress Notes (Signed)
Patient had a peaceful night.  NS continues to infuse at 75 mls/hr via peripheral IV.  No signs or symptoms of infiltration noted.  Antibiotic therapy maintained.  No adverse reactions from antibiotic noted.  Safety and comfort measures maintained.  Call bell within reach.

## 2017-07-13 NOTE — Clinical Social Work Note (Signed)
Clinical Social Work Assessment  Patient Details  Name: James [name] MRN: [id number] Date of Birth: [birth year]  Date of referral:  07/13/17               Reason for consult:  Substance Use/ETOH Abuse                Permission sought to share information with:  Oceanographer granted to share information::  Yes, Verbal Permission Granted  Name::        Agency::  drug rehab programs  Relationship::     Contact Information:     Housing/Transportation Living arrangements for the past 2 months:  Single Family Home Source of Information:  Patient Patient Interpreter Needed:  None Criminal Activity/Legal Involvement Pertinent to Current Situation/Hospitalization:  Yes(pt on parole- has been in contact with parole officer) Significant Relationships:  Adult Children, Other(Comment), Spouse(young grandchildren) Lives with:  Spouse Do you feel safe going back to the place where you live?  No Need for family participation in patient care:  No (Coment)  Care giving concerns:  Pt from home with wife- states they are not in a good place right now due to his drug use.  Patient admits to daily cocaine use since his [birth year].  States he had been clean for several months until his brother died in 27-Jun-2022 and he has been using since.  Patient had strong thoughts of SI during cocaine use leading up to admission and wished he would use enough drugs to make his heart explode.  States he is not having those kind of thoughts right now but that if he discharged to home from the hospital that he knows he would immediately start using and would start having thoughts of intentionally hurting himself again.   Social Worker assessment / plan:  CSW spoke in depth with patient about potential options for rehab.  Patient agreeable to rehab and believes this is the only option for him at this time to avoid relapse.  States he has no where he feels safe that he could go to avoid  using drugs- states he will immediately use drugs upon leaving hospital if he is not sent to rehab center.  CSW has wife but states that they are in a bad place at this time and that staying with her would not prevent his relapse.    Employment status:  Public affairs consultant information:  Self Pay (Medicaid Pending) PT Recommendations:  Not assessed at this time Information / Referral to community resources:  Residential Substance Abuse Treatment Options  Patient/Family's Response to care: Pt agreeable to whatever help he can get to prevent relapse and hopeful for transfer straight to rehab center.  Patient/Family's Understanding of and Emotional Response to Diagnosis, Current Treatment, and Prognosis:  Pt emotional during interview and feels helpless against his addiction- does not think he can do this on his own after the trauma of his brothers death.  Patient gathers strength from his grandchildren and wants to get better for their sake.  Emotional Assessment Appearance:  Appears stated age Attitude/Demeanor/Rapport:    Affect (typically observed):  Appropriate, Tearful/Crying Orientation:  Oriented to Self, Oriented to Place, Oriented to  Time, Oriented to Situation Alcohol / Substance use:  Illicit Drugs Psych involvement (Current and /or in the community):  Yes (Comment)(evaluated for depression and SI)  Discharge Needs  Concerns to be addressed:  Substance Abuse Concerns, Mental Health Concerns Readmission within the last 30 days:  No Current discharge  risk:  Substance Abuse, Lack of support system Barriers to Discharge:  Continued Medical Work up, Active Substance Use   Burna Sis, LCSW 07/13/2017, 12:27 PM

## 2017-07-13 NOTE — Progress Notes (Signed)
CSW working on DC to [employer] morning  CSW called [facility] at bedside with patient- their next appt is April 10th, 2019.  [facility] representative explained that patient could come in at 8am tomorrow however and attempt to get appt if there is a no-show- states that this morning there were 3 no shows and that this is typical so it would be fairly likely that he could be evaluated for admission tomorrow- CSW went ahead and made appointment for April 10th in case unable to get admitted prior to that time.  Called [facility]- they are currently full but state pt should call in to complete pre-screen and then they would call pt when there is a bed which they would anticipate would be later this week- provided number to patient to call and complete prescreen in case unable to go directly into [facility]  [facility] in [place]- they have 2.5 long waitlist for their inpatient program so would not be an option at this time.  CSW informed pt of all that information.  Patient concerned of what will happen if [facility] cannot accept him tomorrow.  CSW provided active listening and support.  Explained that we cannot guarantee admission tomorrow but that by going early and by completing prescreen for [facility] he is giving himself the best chance.  Encouraged pt to reach out to wife for support if he cannot be admitted to rehab and to think of his grandchildren who he sites as reason for wanting to quit in order to avoid relapse while awaiting rehab bed.  CSW informed RN of plan and importance of patient leaving by 7:30am on 4/4 to try and secure rehab bed.  RNCM following and working to get patient medications to take with him to rehab.  CSW printed copy of pt license (in pt DC packet) and completed taxi voucher (in front of chart) to get to [facility].  Burna Sis, LCSW Clinical Social Worker [phone]

## 2017-07-13 NOTE — Progress Notes (Signed)
Nutrition Brief Note  Patient identified on the Malnutrition Screening Tool (MST) Report  Wt Readings from Last 15 Encounters:  07/12/17 144 lb 13.5 oz (65.7 kg)  10/18/16 165 lb (74.8 kg)  09/29/16 171 lb 8.3 oz (77.8 kg)  07/09/16 164 lb (74.4 kg)  05/28/16 162 lb (73.5 kg)  05/14/16 157 lb 3.2 oz (71.3 kg)  05/02/16 148 lb 4.8 oz (67.3 kg)  12/11/15 134 lb (60.8 kg)  12/04/15 134 lb 9.6 oz (61.1 kg)  06/06/15 138 lb (62.6 kg)  05/27/15 138 lb 8 oz (62.8 kg)  03/01/12 160 lb (72.6 kg)  02/26/12 174 lb (78.9 kg)    Body mass index is 19.64 kg/m. Patient meets criteria for normal weight based on current BMI. Pt reports UBW of ~145 lbs and that he is unsure if he has lost weight recently. Per chart review, pt has lost 27 lbs (15.7% body weight) in the past 10 months which is not significant for time frame and weight on that date (09/29/16) appears to actually be an outlier. Skin WDL.   Pt admitted for worsening cough for 2-3 days and receiving outpatient treatment for CAP. Pt with hx of depression and reported SI on admission. Pt with hx of 20 use of cocaine and has had periods of sobriety during that time. He was recently sober x4 months but began using cocaine and experiencing increased depression with SI following the death of his brother 2 weeks ago. Pt denies any issues with appetite but reports poor sleeping habits recently.   Current diet order is Carb Modified and pt consumed 100% of breakfast (515 kcal, 26 grams of protein), 100% of lunch (592 kcal, 28 grams of protein), and 80% of dinner (646 kcal, 30 grams of protein) yesterday. Labs and medications reviewed.   No nutrition interventions warranted at this time. If nutrition issues arise, please consult RD.      Trenton Gammon, MS, RD, LDN, [facility] Inpatient Clinical Dietitian Pager # [phone] After hours/weekend pager # [phone]

## 2017-07-13 NOTE — Progress Notes (Signed)
Inpatient Diabetes Program Recommendations  AACE/ADA: New Consensus Statement on Inpatient Glycemic Control (2015)  Target Ranges:  Prepandial:   less than 140 mg/dL      Peak postprandial:   less than 180 mg/dL (1-2 hours)      Critically ill patients:  140 - 180 mg/dL   Lab Results  Component Value Date   GLUCAP 233 (H) 07/13/2017   HGBA1C 14.6 (H) 07/12/2017    Review of Glycemic Control Results for GABRIEAL, STUPAR (MRN [id number]) as of 07/13/2017 13:44  Ref. Range 07/12/2017 13:32 07/12/2017 16:22 07/12/2017 22:08 07/13/2017 07:21 07/13/2017 11:44  Glucose-Capillary Latest Ref Range: 65 - 99 mg/dL 785 (H) 885 (H) 027 (H) 290 (H) 233 (H)   Diabetes history: DM Outpatient Diabetes medications: See explanation below Current orders for Inpatient glycemic control: Lantus 15 units qd + Novolog sensitive scale tid  Inpatient Diabetes Program Recommendations:    -Increase Lantus to 20 units daily -Add Novolog 4 units tid meal coverage if eats 50%  Spoke with patient @ bedside. Patient states he doesn't normally take Lantus @ home...only took Lantus that was left over from a previous admission. Patient states he takes Novolin 70/30 approximately 3 times per week and takes the Metformin more often. Reviewed A1c and importance of taking insulin with patient. Patient requests to limit teaching time today due to "I'm not feeling well".  Will attempt to followup with patient tomorrow.  Thank you, James [name]. Jaydalee Bardwell, RN, MSN, CDE  Diabetes Coordinator Inpatient Glycemic Control Team Team Pager [phone] (8am-5pm) 07/13/2017 1:47 PM

## 2017-07-13 NOTE — Plan of Care (Signed)
Patient's care plan goals are progressing at this time.

## 2017-07-14 DIAGNOSIS — I10 Essential (primary) hypertension: Secondary | ICD-10-CM | POA: Diagnosis not present

## 2017-07-14 DIAGNOSIS — J181 Lobar pneumonia, unspecified organism: Principal | ICD-10-CM

## 2017-07-14 DIAGNOSIS — I429 Cardiomyopathy, unspecified: Secondary | ICD-10-CM

## 2017-07-14 DIAGNOSIS — F1410 Cocaine abuse, uncomplicated: Secondary | ICD-10-CM | POA: Diagnosis not present

## 2017-07-14 LAB — LEGIONELLA PNEUMOPHILA SEROGP 1 UR AG: L. pneumophila Serogp 1 Ur Ag: NEGATIVE

## 2017-07-14 NOTE — Discharge Summary (Signed)
Physician Discharge Summary  DAJOUR PIERPOINT ZOX:[id number] DOB: 1960/05/05 DOA: 07/12/2017  PCP: Lavinia Sharps, NP  Admit date: 07/12/2017 Discharge date: 07/14/2017  Admitted From: Home Disposition: Home  Recommendations for Outpatient Follow-up:  1. Follow up with PCP in 1 week 2. Please obtain BMP/CBC in one week 3. Please follow up on the following pending results: None  Discharge Condition: Stable CODE STATUS: Full code Diet recommendation: Heart healthy   Brief/Interim Summary:  Admission HPI written by Eduard Clos, MD   Chief Complaint: Cough.  HPI: James [name] is a 57 y.o. 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  Patient states he has not been compliant with his medications for last few days since he ran out of it.  Denies any chest pain fever chills nausea vomiting abdominal pain or diarrhea.  Patient also states that he has been having suicidal thoughts but has not had any plans.  ED Course: In the ER chest x-ray shows features concerning for pneumonia.  EKG shows sinus tachycardia.  Urine drug screen is positive for cocaine.  Blood sugar was more than 600 with a anion gap of 14.  Patient started on IV insulin infusion and antibiotics admitted for further management.  Will need psychiatric consult for suicidal ideation.    Hospital course:  Community acquired pneumonia Blood cultures no growth to date. Afebrile. Treated with ceftriaxone/azithromycin with transition to Vantin and azithromycin on discharge.  Diabetes mellitus, type 2 Hyperglycemia. Continued Lantus and SSI  Nonischemic myopathy Last EF of 55-60% which is an improvement  from prior EF. Discontinued Coreg secondary to cocaine use. Continued lisinopril.  Suicidal ideation Depression Seen by psychiatry. No recommendation for inpatient admission. Psychiatry started Wellbutrin 150 mg daily  Normocytic anemia Iron low on anemia panel. Continued iron supplementation  Polysubstance abuse Social work consulted. Plan for outpatient resources on discharge    Discharge Diagnoses:  Principal Problem:   CAP (community acquired pneumonia) Active Problems:   Essential hypertension   Uncontrolled type 2 diabetes mellitus with hyperglycemia (HCC)   Cardiomyopathy- suspect NICM but etiology not yet determined   Polysubstance abuse (HCC)   Suicidal ideations   [facility]ocaine abuse [facility])    Discharge Instructions  Discharge Instructions    Diet - low sodium heart healthy   Complete by:  As directed    Increase activity slowly   Complete by:  As directed      Allergies as of 07/14/2017   No Known Allergies     Medication List    STOP taking these medications   carvedilol 25 MG tablet Commonly known as:  COREG   furosemide 20 MG tablet Commonly known as:  LASIX   guaiFENesin 600 MG 12 hr tablet Commonly known as:  MUCINEX   insulin aspart protamine- aspart (70-30) 100 UNIT/ML injection Commonly known as:  NOVOLOG MIX 70/30   spironolactone 25 MG tablet Commonly known as:  ALDACTONE   traZODone 50 MG tablet Commonly known as:  DESYREL     TAKE these medications   atorvastatin 40 MG tablet Commonly known as:  LIPITOR Take 1 tablet (40 mg total) by mouth daily. For high cholesterol   azithromycin 500 MG tablet Commonly known  as:  ZITHROMAX Take 1 tablet (500 mg total) by mouth daily for 1 dose.   buPROPion 150 MG 24 hr tablet Commonly known as:  WELLBUTRIN XL Take 1 tablet (150 mg total) by mouth daily. For depression   cefpodoxime 200 MG tablet Commonly known as:  VANTIN Take 1 tablet (200 mg total) by mouth 2 (two) times daily for 5  days.   ferrous sulfate 325 (65 FE) MG tablet Take 1 tablet (325 mg total) by mouth 2 (two) times daily with a meal.   insulin glargine 100 UNIT/ML injection Commonly known as:  LANTUS Inject 0.15 mLs (15 Units total) into the skin at bedtime. For diabetes management What changed:    how much to take  additional instructions   INSULIN SYRINGE .3CC/31GX5/16" 31G X 5/16" 0.3 ML Misc Use with lantus, once daily: For diabetes management   lisinopril 20 MG tablet Commonly known as:  PRINIVIL,ZESTRIL Take 1 tablet (20 mg total) by mouth daily.   metFORMIN 1000 MG tablet Commonly known as:  GLUCOPHAGE Take 1 tablet (1,000 mg total) by mouth 2 (two) times daily with a meal. For diabetes management      Follow-up Information    Placey, Chales Abrahams, NP. Schedule an appointment as soon as possible for a visit in 1 week(s).   Contact information: [address] [place] [address] [phone]          No Known Allergies  Consultations:  Psychiatry   Procedures/Studies: Dg Chest 2 View  Result Date: 07/12/2017 CLINICAL DATA:  57 year old male with cough. EXAM: CHEST - 2 VIEW COMPARISON:  Chest radiograph dated 04/30/2016 FINDINGS: Small bibasilar nodular densities concerning for pneumonia. Clinical correlation is recommended. There is no focal consolidation, pleural effusion, or pneumothorax. The cardiac silhouette is within normal limits. No acute osseous pathology. IMPRESSION: Bibasilar nodular densities concerning for pneumonia. Clinical correlation is recommended. No focal consolidation. Electronically Signed   By: Elgie Collard M.D.   On: 07/12/2017 00:34      Subjective: No issues overnight.  Discharge Exam: Vitals:   07/13/17 2353 07/14/17 0503  BP: 139/81 (!) 143/85  Pulse: 91 93  Resp: 16 18  Temp: 98.3 F (36.8 C) (!) 97.3 F (36.3 C)  SpO2: 100% 99%   Vitals:   07/13/17 1153 07/13/17 1936 07/13/17 2353 07/14/17 0503  BP: (!) 144/81 (!) 144/94  139/81 (!) 143/85  Pulse: (!) 102 98 91 93  Resp:  18 16 18   Temp: 98.2 F (36.8 C) 98.4 F (36.9 C) 98.3 F (36.8 C) (!) 97.3 F (36.3 C)  TempSrc: Oral Oral Oral Oral  SpO2: 97% 99% 100% 99%  Weight:    63.6 kg (140 lb 3.4 oz)  Height:        General: Pt is alert, awake, not in acute distress Cardiovascular: RRR, S1/S2 +, no rubs, no gallops Respiratory: CTA bilaterally, no wheezing, no rhonchi Abdominal: Soft, NT, ND, bowel sounds + Extremities: no edema, no cyanosis    The results of significant diagnostics from this hospitalization (including imaging, microbiology, ancillary and laboratory) are listed below for reference.     Microbiology: Recent Results (from the past 240 hour(s))  Culture, blood (routine x 2) Call MD if unable to obtain prior to antibiotics being given     Status: None (Preliminary result)   Collection Time: 07/12/17  5:20 AM  Result Value Ref Range Status   Specimen Description BLOOD RIGHT ANTECUBITAL  Final   Special Requests   Final  IN PEDIATRIC BOTTLE Blood Culture results may not be optimal due to an excessive volume of blood received in culture bottles   Culture   Final    NO GROWTH 1 DAY Performed at [facility], [address], [place], [place] [address]    Report Status PENDING  Incomplete  Culture, blood (routine x 2) Call MD if unable to obtain prior to antibiotics being given     Status: None (Preliminary result)   Collection Time: 07/12/17  5:30 AM  Result Value Ref Range Status   Specimen Description BLOOD RIGHT FOREARM  Final   Special Requests   Final    BOTTLES DRAWN AEROBIC AND ANAEROBIC Blood Culture adequate volume   Culture   Final    NO GROWTH 1 DAY Performed at [facility], [address], [place], [place] [address]    Report Status PENDING  Incomplete  MRSA PCR Screening     Status: None   Collection Time: 07/12/17  3:55 PM  Result Value Ref Range Status   MRSA by PCR NEGATIVE NEGATIVE Final     Comment:        The GeneXpert MRSA Assay (FDA approved for NASAL specimens only), is one component of a comprehensive MRSA colonization surveillance program. It is not intended to diagnose MRSA infection nor to guide or monitor treatment for MRSA infections. Performed at [facility], [address], [place], [place] [address]      Labs: BNP (last 3 results) No results for input(s): BNP in the last 8760 hours. Basic Metabolic Panel: Recent Labs  Lab 07/12/17 0005 07/12/17 0527 07/12/17 1439 07/13/17 0343  NA 126*  --  130* 132*  K 4.2  --  3.8 3.6  CL 89*  --  94* 96*  CO2 23  --  25 24  GLUCOSE 604*  --  385* 330*  BUN 17  --  13 9  CREATININE 1.14 1.04 0.92 0.94  CALCIUM 9.1  --  8.4* 8.3*   Liver Function Tests: Recent Labs  Lab 07/12/17 0005  AST 16  ALT 15*  ALKPHOS 82  BILITOT 0.6  PROT 7.4  ALBUMIN 3.3*   No results for input(s): LIPASE, AMYLASE in the last 168 hours. No results for input(s): AMMONIA in the last 168 hours. CBC: Recent Labs  Lab 07/12/17 0005 07/12/17 0535 07/13/17 0343  WBC 7.6 6.3 7.8  HGB 12.7* 12.0* 11.6*  HCT 37.8* 35.6* 35.0*  MCV 86.3 85.4 85.4  PLT 299 257 286   Cardiac Enzymes: Recent Labs  Lab 07/12/17 0535  TROPONINI <0.03   BNP: Invalid input(s): POCBNP CBG: Recent Labs  Lab 07/12/17 2208 07/13/17 0721 07/13/17 1144 07/13/17 1633 07/13/17 2138  GLUCAP 346* 290* 233* 313* 137*   D-Dimer No results for input(s): DDIMER in the last 72 hours. Hgb A1c Recent Labs    07/12/17 0527  HGBA1C 14.6*   Lipid Profile No results for input(s): CHOL, HDL, LDLCALC, TRIG, CHOLHDL, LDLDIRECT in the last 72 hours. Thyroid function studies No results for input(s): TSH, T4TOTAL, T3FREE, THYROIDAB in the last 72 hours.  Invalid input(s): FREET3 Anemia work up Recent Labs    07/12/17 0535  VITAMINB12 1,822*  FOLATE 9.3  FERRITIN 130  TIBC 214*  IRON 19*  RETICCTPCT 0.4   Urinalysis    Component  Value Date/Time   COLORURINE STRAW (A) 02/23/2017 0657   APPEARANCEUR CLEAR 02/23/2017 0657   LABSPEC 1.028 02/23/2017 0657   PHURINE  5.0 02/23/2017 0657   GLUCOSEU >=500 (A) 02/23/2017 0657   HGBUR NEGATIVE 02/23/2017 0657   HGBUR negative 08/06/2008 1124   BILIRUBINUR NEGATIVE 02/23/2017 0657   KETONESUR NEGATIVE 02/23/2017 0657   PROTEINUR NEGATIVE 02/23/2017 0657   UROBILINOGEN 1.0 12/06/2012 1637   NITRITE NEGATIVE 02/23/2017 0657   LEUKOCYTESUR NEGATIVE 02/23/2017 0657   Sepsis Labs Invalid input(s): PROCALCITONIN,  WBC,  LACTICIDVEN Microbiology Recent Results (from the past 240 hour(s))  Culture, blood (routine x 2) Call MD if unable to obtain prior to antibiotics being given     Status: None (Preliminary result)   Collection Time: 07/12/17  5:20 AM  Result Value Ref Range Status   Specimen Description BLOOD RIGHT ANTECUBITAL  Final   Special Requests   Final    IN PEDIATRIC BOTTLE Blood Culture results may not be optimal due to an excessive volume of blood received in culture bottles   Culture   Final    NO GROWTH 1 DAY Performed at [facility], [address], [place], [place] [address]    Report Status PENDING  Incomplete  Culture, blood (routine x 2) Call MD if unable to obtain prior to antibiotics being given     Status: None (Preliminary result)   Collection Time: 07/12/17  5:30 AM  Result Value Ref Range Status   Specimen Description BLOOD RIGHT FOREARM  Final   Special Requests   Final    BOTTLES DRAWN AEROBIC AND ANAEROBIC Blood Culture adequate volume   Culture   Final    NO GROWTH 1 DAY Performed at [facility], [address], [place], [place] [address]    Report Status PENDING  Incomplete  MRSA PCR Screening     Status: None   Collection Time: 07/12/17  3:55 PM  Result Value Ref Range Status   MRSA by PCR NEGATIVE NEGATIVE Final    Comment:        The GeneXpert MRSA Assay (FDA approved for NASAL specimens only), is one component of  a comprehensive MRSA colonization surveillance program. It is not intended to diagnose MRSA infection nor to guide or monitor treatment for MRSA infections. Performed at [facility], [address], [place], [place] [address]     SIGNED:   Jacquelin Hawking, MD [facility] 07/14/2017, 7:14 AM Pager [phone]  If 7PM-7AM, please contact night-coverage [email or web address] Password [email or web address]

## 2017-07-14 NOTE — Plan of Care (Signed)
Patient is being discharged via cab to [facility] in [place], [place]

## 2017-07-14 NOTE — Progress Notes (Signed)
Patient being discharge with 3 months supply of medications from pharmacy previously discussed by MD and Social worker

## 2017-07-14 NOTE — Discharge Instructions (Signed)
James [name],  You were treated for pneumonia. You will need to complete your antibiotics as prescribed.

## 2017-07-17 LAB — CULTURE, BLOOD (ROUTINE X 2)
CULTURE: NO GROWTH
CULTURE: NO GROWTH
Special Requests: ADEQUATE

## 2017-10-14 ENCOUNTER — Emergency Department (HOSPITAL_COMMUNITY)
Admission: EM | Admit: 2017-10-14 | Discharge: 2017-10-14 | Disposition: A | Discharge status: Home / Self Care | Payer: Self-pay | Attending: Emergency Medicine | Admitting: Emergency Medicine

## 2017-10-14 ENCOUNTER — Emergency Department (HOSPITAL_COMMUNITY): Payer: Self-pay

## 2017-10-14 ENCOUNTER — Encounter (HOSPITAL_COMMUNITY): Payer: Self-pay | Admitting: Emergency Medicine

## 2017-10-14 ENCOUNTER — Other Ambulatory Visit: Payer: Self-pay

## 2017-10-14 DIAGNOSIS — S90221A Contusion of right lesser toe(s) with damage to nail, initial encounter: Secondary | ICD-10-CM

## 2017-10-14 DIAGNOSIS — W28XXXA Contact with powered lawn mower, initial encounter: Secondary | ICD-10-CM | POA: Insufficient documentation

## 2017-10-14 DIAGNOSIS — E119 Type 2 diabetes mellitus without complications: Secondary | ICD-10-CM | POA: Insufficient documentation

## 2017-10-14 DIAGNOSIS — F17210 Nicotine dependence, cigarettes, uncomplicated: Secondary | ICD-10-CM | POA: Insufficient documentation

## 2017-10-14 DIAGNOSIS — Y93H9 Activity, other involving exterior property and land maintenance, building and construction: Secondary | ICD-10-CM | POA: Insufficient documentation

## 2017-10-14 DIAGNOSIS — S90111A Contusion of right great toe without damage to nail, initial encounter: Secondary | ICD-10-CM | POA: Insufficient documentation

## 2017-10-14 DIAGNOSIS — Z79899 Other long term (current) drug therapy: Secondary | ICD-10-CM | POA: Insufficient documentation

## 2017-10-14 DIAGNOSIS — I5022 Chronic systolic (congestive) heart failure: Secondary | ICD-10-CM | POA: Insufficient documentation

## 2017-10-14 DIAGNOSIS — I110 Hypertensive heart disease with heart failure: Secondary | ICD-10-CM | POA: Insufficient documentation

## 2017-10-14 DIAGNOSIS — Y929 Unspecified place or not applicable: Secondary | ICD-10-CM | POA: Insufficient documentation

## 2017-10-14 DIAGNOSIS — Z794 Long term (current) use of insulin: Secondary | ICD-10-CM | POA: Insufficient documentation

## 2017-10-14 DIAGNOSIS — Y999 Unspecified external cause status: Secondary | ICD-10-CM | POA: Insufficient documentation

## 2017-10-14 MED ORDER — ACETAMINOPHEN 325 MG PO TABS
650.0000 mg | ORAL_TABLET | Freq: Once | ORAL | Status: AC
  Administered 2017-10-14: 650 mg via ORAL
  Filled 2017-10-14: qty 2

## 2017-10-14 NOTE — ED Notes (Signed)
Patient transported to X-ray 

## 2017-10-14 NOTE — ED Provider Notes (Signed)
[facility] EMERGENCY DEPARTMENT Provider Note   CSN: [id number] Arrival date & time: 10/14/17  1422     History   Chief Complaint Chief Complaint  Patient presents with  . Toe Pain    HPI James [name] is a 57 y.o. male.  ANIKETH GRASSL is a 57 y.o. Male with history of CHF, diabetes, hypertension and depression, who presents for evaluation of right great toe discoloration.  Patient reports last week he was kicking brush under his lawnmower blades when the blade hit his shoe, at the time he was wearing steel toed boots and did not sustain any laceration but reports since then he is noted darkness under the nail of his right great toe and at the base of the toe.  He reports minimal pain if any and is able to move the toe and walk without difficulty.  He denies any redness, swelling or warmth.  Has not had any fevers or chills, has not noticed any red streaking.  Nail is still completely intact.  Patient denies any pain in the foot or ankle or any other toes.     Past Medical History:  Diagnosis Date  . Cellulitis and abscess of foot 09/29/2016  . Chronic systolic heart failure (HCC)   . Depression   . Diabetes mellitus without complication (HCC)   . Essential hypertension   . NICM (nonischemic cardiomyopathy) (HCC) EF 20-25%     Patient Active Problem List   Diagnosis Date Noted  . Polysubstance abuse (HCC) 07/12/2017  . Suicidal ideations 07/12/2017  . Cocaine abuse (HCC)   . MDD (major depressive disorder) 01/14/2017  . Cellulitis of left foot 09/28/2016  . Cellulitis 09/28/2016  . Cardiomyopathy- suspect NICM but etiology not yet determined 04/27/2016  . Acute combined systolic and diastolic heart failure (HCC) 04/27/2016  . History of major depression 04/27/2016  . CAP (community acquired pneumonia) 04/26/2016  . Acute respiratory failure with hypoxia (HCC) 04/26/2016  . Controlled type 2 diabetes mellitus with hyperglycemia (HCC) 04/26/2016    . Uncontrolled type 2 diabetes mellitus with hyperglycemia (HCC) 04/26/2016  . Alcohol use disorder, mild, abuse 12/12/2015  . MDD (major depressive disorder), recurrent severe, without psychosis (HCC) 06/26/2015  . History of cocaine abuse 06/25/2015  . SPONDYLOSIS, CERVICAL, WITH RADICULOPATHY 02/25/2009  . MUSCLE SPASM, BACK 02/12/2009  . LEUKOPENIA, MILD 08/19/2008  . ECZEMA, HANDS 08/06/2008  . HYPERCHOLESTEROLEMIA 05/09/2007  . SINUSITIS, CHRONIC 05/01/2007  . ALLERGIC RHINITIS 12/21/2006  . Essential hypertension 12/07/2006    Past Surgical History:  Procedure Laterality Date  . CARDIAC CATHETERIZATION N/A 04/30/2016   Procedure: Right/Left Heart Cath and Coronary Angiography;  Surgeon: Yvonne Kendall, MD;  Location: [facility] INVASIVE CV LAB;  Service: Cardiovascular;  Laterality: N/A;  . FINGER SURGERY          Home Medications    Prior to Admission medications   Medication Sig Start Date End Date Taking? Authorizing Provider  atorvastatin (LIPITOR) 40 MG tablet Take 1 tablet (40 mg total) by mouth daily. For high cholesterol 07/13/17   Narda Bonds, MD  buPROPion (WELLBUTRIN XL) 150 MG 24 hr tablet Take 1 tablet (150 mg total) by mouth daily. For depression 07/13/17   Narda Bonds, MD  ferrous sulfate 325 (65 FE) MG tablet Take 1 tablet (325 mg total) by mouth 2 (two) times daily with a meal. 07/13/17   Narda Bonds, MD  insulin glargine (LANTUS) 100 UNIT/ML injection Inject 0.15 mLs (15 Units total)  into the skin at bedtime. For diabetes management 07/13/17   Narda Bonds, MD  Insulin Syringe-Needle U-100 (INSULIN SYRINGE .3CC/31GX5/16") 31G X 5/16" 0.3 ML MISC Use with lantus, once daily: For diabetes management 07/13/17   Narda Bonds, MD  lisinopril (PRINIVIL,ZESTRIL) 20 MG tablet Take 1 tablet (20 mg total) by mouth daily. 07/13/17   Narda Bonds, MD  metFORMIN (GLUCOPHAGE) 1000 MG tablet Take 1 tablet (1,000 mg total) by mouth 2 (two) times daily with a meal. For  diabetes management 07/13/17   Narda Bonds, MD    Family History Family History  Problem Relation Age of Onset  . Diabetes Other   . Hypertension Other   . Diabetes Mother   . Brain cancer Father   . Mental illness Brother   . Drug abuse Brother   . Diabetes Brother     Social History Social History   Tobacco Use  . Smoking status: Current Every Day Smoker    Packs/day: 0.50    Years: 23.00    Pack years: 11.50    Types: Cigarettes  . Smokeless tobacco: Never Used  . Tobacco comment: Every other day   Substance Use Topics  . Alcohol use: No  . Drug use: Yes    Types: Cocaine, "Crack" cocaine     Allergies   Patient has no known allergies.   Review of Systems Review of Systems  Constitutional: Negative for chills and fever.  Musculoskeletal: Positive for arthralgias. Negative for joint swelling.  Skin: Positive for color change.  Neurological: Negative for weakness and numbness.     Physical Exam Updated Vital Signs BP 101/65 (BP Location: Right Arm)   Pulse (!) 102   Temp 98.3 F (36.8 C) (Oral)   Resp 16   Ht 6' (1.829 m)   Wt 70.3 kg (155 lb)   SpO2 99%   BMI 21.02 kg/m   Physical Exam  Constitutional: He appears well-developed and well-nourished. No distress.  HENT:  Head: Normocephalic and atraumatic.  Eyes: Right eye exhibits no discharge. Left eye exhibits no discharge.  Pulmonary/Chest: Effort normal. No respiratory distress.  Musculoskeletal:  Right great toe with mild ecchymosis at the base of the nailbed and possible small subungual hematoma, difficult to tell as patient's toenails are very thick, but is darker when compared to left great toe.  No tenderness on palpation, minimal swelling.  Normal range of motion, sensation intact, 2+ DP and TP pulses and good capillary refill.  Neurological: He is alert. Coordination normal.  Skin: Skin is warm and dry. Capillary refill takes less than 2 seconds. He is not diaphoretic.  Psychiatric: He  has a normal mood and affect. His behavior is normal.  Nursing note and vitals reviewed.    ED Treatments / Results  Labs (all labs ordered are listed, but only abnormal results are displayed) Labs Reviewed - No data to display  EKG None  Radiology Dg Foot Complete Right  Result Date: 10/14/2017 CLINICAL DATA:  boot got caught by the mower blade, it did not reach his foot. Pt states that this week he notice some pain in his right big toe and some discoloration on the distal aspect (area indicated on images). Pt states the pain has gotten worse over time. EXAM: RIGHT FOOT COMPLETE - 3+ VIEW COMPARISON:  None. FINDINGS: There is no evidence of fracture or dislocation. There is no evidence of arthropathy or other focal bone abnormality. No radiodense foreign body. Soft tissues are unremarkable. Calcifications  in the distal posterior tibial artery. IMPRESSION: No fracture, foreign body, or other acute finding. Electronically Signed   By: Corlis Leak M.D.   On: 10/14/2017 15:14    Procedures Procedures (including critical care time)  Medications Ordered in ED Medications  acetaminophen (TYLENOL) tablet 650 mg (650 mg Oral Given 10/14/17 1709)     Initial Impression / Assessment and Plan / ED Course  I have reviewed the triage vital signs and the nursing notes.  Pertinent labs & imaging results that were available during my care of the patient were reviewed by me and considered in my medical decision making (see chart for details).  Patient presents for evaluation of darkening and discoloration of the right great toe.  Injury to the toe approximately 1 week ago while wearing steel toed boots.  On exam there is a mild ecchymosis at the base of the nailbed and apparent subungual hematoma I feel this is likely the cause of discoloration, toe pillars warm and well-perfused, range of motion intact in the toes neurovascularly intact.  X-ray shows no acute fracture or foreign body or other  abnormality.  Discussed this with the patient, do not feel that trephination will be beneficial as injury is 93 week old and subungual hematoma is likely coagulated.  Encourage patient to use Tylenol, ice and elevation and follow-up with primary care doctor if pain persist.  Discussed with patient that toenail may fall off.  He expresses understanding and is in agreement with the plan.  Final Clinical Impressions(s) / ED Diagnoses   Final diagnoses:  Contusion of right great toe without damage to nail, initial encounter  Subungual hematoma of right foot, initial encounter    ED Discharge Orders    None       Legrand Rams 10/14/17 Marcell Anger, MD 10/16/17 [phone]

## 2017-10-14 NOTE — ED Triage Notes (Addendum)
Patient to ED c/o R great toe discoloration x 1 week - states it throbs off and on, no increased pain with movement or ambulation, full ROM noted. No swelling compared to L side, but R side does appear darker in color. Skin temperature equal bilaterally. Denies injury.

## 2017-10-14 NOTE — ED Provider Notes (Signed)
Patient placed in Quick Look pathway, seen and evaluated   Chief Complaint: right toe pain  HPI:   James [name] is a 57 y.o. male who presents to the ED for right great toe pain that started last week. Patient states that he was kicking brush under his lawnmower and the blade hit his shoe and since then he has had pain and swelling of the right great toe.  ROS: M/S: right great toe pain  Physical Exam:  BP 101/65 (BP Location: Right Arm)   Pulse (!) 102   Temp 98.3 F (36.8 C) (Oral)   Resp 16   Ht 6' (1.829 m)   Wt 70.3 kg (155 lb)   SpO2 99%   BMI 21.02 kg/m    Gen: No distress  Neuro: Awake and Alert  Skin: Warm and dry  M/S: right great toe with swelling and mild ecchymosis.    Initiation of care has begun. The patient has been counseled on the process, plan, and necessity for staying for the completion/evaluation, and the remainder of the medical screening examination    Janne Napoleon, NP 10/14/17 1448    Jacalyn Lefevre, MD 10/14/17 [phone]

## 2017-10-14 NOTE — Discharge Instructions (Signed)
Discoloration of the toe and under the nail is likely due to bruising and subungual hematoma, or bleeding under the toenail, this should slowly improve on its own, sometimes the nail does fall off.  You may use ice, elevation and Tylenol as needed for pain.  Follow-up with your primary doctor.  Return if your toe becomes red, hot or swollen or you have any fevers or significantly increased pain.

## 2019-02-08 ENCOUNTER — Other Ambulatory Visit: Payer: Self-pay

## 2019-02-08 ENCOUNTER — Emergency Department (HOSPITAL_COMMUNITY)
Admission: EM | Admit: 2019-02-08 | Discharge: 2019-02-10 | Disposition: A | Discharge status: Other Acute Inpatient Hospital | Payer: Self-pay | Attending: Emergency Medicine | Admitting: Emergency Medicine

## 2019-02-08 ENCOUNTER — Encounter (HOSPITAL_COMMUNITY): Payer: Self-pay | Admitting: Emergency Medicine

## 2019-02-08 DIAGNOSIS — F1410 Cocaine abuse, uncomplicated: Secondary | ICD-10-CM | POA: Insufficient documentation

## 2019-02-08 DIAGNOSIS — I5042 Chronic combined systolic (congestive) and diastolic (congestive) heart failure: Secondary | ICD-10-CM | POA: Insufficient documentation

## 2019-02-08 DIAGNOSIS — I110 Hypertensive heart disease with heart failure: Secondary | ICD-10-CM | POA: Insufficient documentation

## 2019-02-08 DIAGNOSIS — F332 Major depressive disorder, recurrent severe without psychotic features: Secondary | ICD-10-CM | POA: Insufficient documentation

## 2019-02-08 DIAGNOSIS — R739 Hyperglycemia, unspecified: Secondary | ICD-10-CM

## 2019-02-08 DIAGNOSIS — Z20828 Contact with and (suspected) exposure to other viral communicable diseases: Secondary | ICD-10-CM | POA: Insufficient documentation

## 2019-02-08 DIAGNOSIS — Z79899 Other long term (current) drug therapy: Secondary | ICD-10-CM | POA: Insufficient documentation

## 2019-02-08 DIAGNOSIS — Z794 Long term (current) use of insulin: Secondary | ICD-10-CM | POA: Insufficient documentation

## 2019-02-08 DIAGNOSIS — F17210 Nicotine dependence, cigarettes, uncomplicated: Secondary | ICD-10-CM | POA: Insufficient documentation

## 2019-02-08 DIAGNOSIS — R45851 Suicidal ideations: Secondary | ICD-10-CM | POA: Insufficient documentation

## 2019-02-08 DIAGNOSIS — E1165 Type 2 diabetes mellitus with hyperglycemia: Secondary | ICD-10-CM | POA: Insufficient documentation

## 2019-02-08 LAB — CBC
HCT: 41.1 % (ref 39.0–52.0)
Hemoglobin: 14.1 g/dL (ref 13.0–17.0)
MCH: 29.4 pg (ref 26.0–34.0)
MCHC: 34.3 g/dL (ref 30.0–36.0)
MCV: 85.6 fL (ref 80.0–100.0)
Platelets: 241 10*3/uL (ref 150–400)
RBC: 4.8 MIL/uL (ref 4.22–5.81)
RDW: 13.5 % (ref 11.5–15.5)
WBC: 5 10*3/uL (ref 4.0–10.5)
nRBC: 0 % (ref 0.0–0.2)

## 2019-02-08 LAB — CBG MONITORING, ED
Glucose-Capillary: 392 mg/dL — ABNORMAL HIGH (ref 70–99)
Glucose-Capillary: 376 mg/dL — ABNORMAL HIGH (ref 70–99)
Glucose-Capillary: 394 mg/dL — ABNORMAL HIGH (ref 70–99)
Glucose-Capillary: 293 mg/dL — ABNORMAL HIGH (ref 70–99)
Glucose-Capillary: 370 mg/dL — ABNORMAL HIGH (ref 70–99)

## 2019-02-08 LAB — RAPID URINE DRUG SCREEN, HOSP PERFORMED
Amphetamines: NOT DETECTED
Barbiturates: NOT DETECTED
Benzodiazepines: NOT DETECTED
Cocaine: POSITIVE — AB
Opiates: NOT DETECTED
Tetrahydrocannabinol: NOT DETECTED

## 2019-02-08 LAB — COMPREHENSIVE METABOLIC PANEL
ALT: 19 U/L (ref 0–44)
AST: 17 U/L (ref 15–41)
Albumin: 3.8 g/dL (ref 3.5–5.0)
Alkaline Phosphatase: 71 U/L (ref 38–126)
Anion gap: 15 (ref 5–15)
BUN: 14 mg/dL (ref 6–20)
CO2: 23 mmol/L (ref 22–32)
Calcium: 9.3 mg/dL (ref 8.9–10.3)
Chloride: 93 mmol/L — ABNORMAL LOW (ref 98–111)
Creatinine, Ser: 1.22 mg/dL (ref 0.61–1.24)
GFR calc Af Amer: >60 mL/min (ref >60)
GFR calc non Af Amer: >60 mL/min (ref >60)
Glucose, Bld: 536 mg/dL (ref 70–99)
Potassium: 4.9 mmol/L (ref 3.5–5.1)
Sodium: 131 mmol/L — ABNORMAL LOW (ref 135–145)
Total Bilirubin: 0.6 mg/dL (ref 0.3–1.2)
Total Protein: 7 g/dL (ref 6.5–8.1)

## 2019-02-08 LAB — ETHANOL: Alcohol, Ethyl (B): <10 mg/dL (ref <10)

## 2019-02-08 LAB — ACETAMINOPHEN LEVEL: Acetaminophen (Tylenol), Serum: <10 ug/mL — ABNORMAL LOW (ref 10–30)

## 2019-02-08 LAB — SALICYLATE LEVEL: Salicylate Lvl: <7 mg/dL (ref 2.8–30.0)

## 2019-02-08 LAB — SARS CORONAVIRUS 2 BY RT PCR (HOSPITAL ORDER, PERFORMED IN ~~LOC~~ HOSPITAL LAB): SARS Coronavirus 2: NEGATIVE

## 2019-02-08 MED ORDER — ALUM & MAG HYDROXIDE-SIMETH 200-200-20 MG/5ML PO SUSP: 30.0000 mL | Freq: Four times a day (QID) | ORAL | Status: DC | PRN

## 2019-02-08 MED ORDER — LISINOPRIL 20 MG PO TABS
20.0000 mg | ORAL_TABLET | Freq: Every day | ORAL | Status: DC
  Administered 2019-02-08 – 2019-02-10 (×3): 20 mg via ORAL
  Filled 2019-02-08 (×3): qty 1

## 2019-02-08 MED ORDER — SODIUM CHLORIDE 0.9 % IV BOLUS
1000.0000 mL | Freq: Once | INTRAVENOUS | Status: AC
  Administered 2019-02-08: 1000 mL via INTRAVENOUS

## 2019-02-08 MED ORDER — INSULIN GLARGINE 100 UNIT/ML ~~LOC~~ SOLN
15.0000 [IU] | Freq: Every day | SUBCUTANEOUS | Status: DC
  Administered 2019-02-08 – 2019-02-09 (×2): 15 [IU] via SUBCUTANEOUS
  Filled 2019-02-08 (×3): qty 0.15

## 2019-02-08 MED ORDER — ATORVASTATIN CALCIUM 40 MG PO TABS
40.0000 mg | ORAL_TABLET | Freq: Every day | ORAL | Status: DC
  Administered 2019-02-08 – 2019-02-10 (×3): 40 mg via ORAL
  Filled 2019-02-08 (×3): qty 1

## 2019-02-08 MED ORDER — INSULIN GLARGINE 100 UNIT/ML ~~LOC~~ SOLN
15.0000 [IU] | Freq: Once | SUBCUTANEOUS | Status: AC
  Administered 2019-02-08: 15 [IU] via SUBCUTANEOUS
  Filled 2019-02-08: qty 0.15

## 2019-02-08 MED ORDER — ACETAMINOPHEN 325 MG PO TABS: 650.0000 mg | ORAL_TABLET | ORAL | Status: DC | PRN

## 2019-02-08 MED ORDER — INSULIN ASPART 100 UNIT/ML ~~LOC~~ SOLN
10.0000 [IU] | Freq: Once | SUBCUTANEOUS | Status: AC
  Administered 2019-02-08: 10 [IU] via INTRAVENOUS

## 2019-02-08 MED ORDER — METFORMIN HCL 500 MG PO TABS
1000.0000 mg | ORAL_TABLET | Freq: Two times a day (BID) | ORAL | Status: DC
  Administered 2019-02-08 – 2019-02-10 (×5): 1000 mg via ORAL
  Filled 2019-02-08 (×5): qty 2

## 2019-02-08 NOTE — ED Notes (Signed)
TTS at bedside. 

## 2019-02-08 NOTE — ED Notes (Addendum)
Pt changed into purple scrub top. All pants pockets emptied, pt retained glasses and personal mask. Security notifed for wanding. Staffing notifed for sitter. Pt's belongings placed in [place] # [address], not inventoried at this time.

## 2019-02-08 NOTE — ED Provider Notes (Signed)
[facility] EMERGENCY DEPARTMENT Provider Note   CSN: [id number] Arrival date & time: 02/08/19  0014     History   Chief Complaint Chief Complaint  Patient presents with  . Suicidal    HPI James [name] is a 58 y.o. male with a history of cocaine use disorder, diabetes mellitus type 2, alcohol use disorder, major depressive disorder, and nonischemic cardiomyopathy, and HTN who presents to the emergency department with a chief complaint of suicidal thoughts.  The patient endorses suicidal thoughts.  He had a plan to cut his neck with a razor, but states that he was unable to act on this due to concern about his granddaughters.  He reports that he was sober from using cocaine for 3-month, but relapsed over the last 2 to 3 weeks.  He reports that his last cocaine use was yesterday.  He also reports that he has been noncompliant with his home medications "for a minute".  He is unable to elaborate on this length of time.  He states that he has not taken his medications because "he wasn't able to access them".  He states that he has his prescriptions and his medications that are in his home, but he has not been able to take them.  States this is because "he wasn't really able to get home", but does not elaborate.  He has a history of previous suicide attempt in 2018.  He has not established with outpatient behavioral health.  He denies HI or auditory visual hallucinations.  He denies fever, chills, cough, chest pain, shortness of breath, abdominal pain, nausea, vomiting, diarrhea.   He denies alcohol use.  Denies other illicit or recreational drug use.     The history is provided by the patient. No language interpreter was used.    Past Medical History:  Diagnosis Date  . Cellulitis and abscess of foot 09/29/2016  . Chronic systolic heart failure (HCC)   . Depression   . Diabetes mellitus without complication (HCC)   . Essential hypertension   . NICM  (nonischemic cardiomyopathy) (HCC) EF 20-25%     Patient Active Problem List   Diagnosis Date Noted  . Polysubstance abuse (HCC) 07/12/2017  . Suicidal ideations 07/12/2017  . Cocaine abuse (HCC)   . MDD (major depressive disorder) 01/14/2017  . Cellulitis of left foot 09/28/2016  . Cellulitis 09/28/2016  . Cardiomyopathy- suspect NICM but etiology not yet determined 04/27/2016  . Acute combined systolic and diastolic heart failure (HCC) 04/27/2016  . History of major depression 04/27/2016  . CAP (community acquired pneumonia) 04/26/2016  . Acute respiratory failure with hypoxia (HCC) 04/26/2016  . Controlled type 2 diabetes mellitus with hyperglycemia (HCC) 04/26/2016  . Uncontrolled type 2 diabetes mellitus with hyperglycemia (HCC) 04/26/2016  . Alcohol use disorder, mild, abuse 12/12/2015  . MDD (major depressive disorder), recurrent severe, without psychosis (HCC) 06/26/2015  . History of cocaine abuse (HCC) 06/25/2015  . SPONDYLOSIS, CERVICAL, WITH RADICULOPATHY 02/25/2009  . MUSCLE SPASM, BACK 02/12/2009  . LEUKOPENIA, MILD 08/19/2008  . ECZEMA, HANDS 08/06/2008  . HYPERCHOLESTEROLEMIA 05/09/2007  . SINUSITIS, CHRONIC 05/01/2007  . ALLERGIC RHINITIS 12/21/2006  . Essential hypertension 12/07/2006    Past Surgical History:  Procedure Laterality Date  . CARDIAC CATHETERIZATION N/A 04/30/2016   Procedure: Right/Left Heart Cath and Coronary Angiography;  Surgeon: Yvonne Kendall, MD;  Location: [facility] INVASIVE CV LAB;  Service: Cardiovascular;  Laterality: N/A;  . FINGER SURGERY          Home  Medications    Prior to Admission medications   Medication Sig Start Date End Date Taking? Authorizing Provider  atorvastatin (LIPITOR) 40 MG tablet Take 1 tablet (40 mg total) by mouth daily. For high cholesterol 07/13/17   Mariel Aloe, MD  buPROPion (WELLBUTRIN XL) 150 MG 24 hr tablet Take 1 tablet (150 mg total) by mouth daily. For depression 07/13/17   Mariel Aloe, MD  ferrous  sulfate 325 (65 FE) MG tablet Take 1 tablet (325 mg total) by mouth 2 (two) times daily with a meal. 07/13/17   Mariel Aloe, MD  insulin glargine (LANTUS) 100 UNIT/ML injection Inject 0.15 mLs (15 Units total) into the skin at bedtime. For diabetes management 07/13/17   Mariel Aloe, MD  Insulin Syringe-Needle U-100 (INSULIN SYRINGE .3CC/31GX5/16") 31G X 5/16" 0.3 ML MISC Use with lantus, once daily: For diabetes management 07/13/17   Mariel Aloe, MD  lisinopril (PRINIVIL,ZESTRIL) 20 MG tablet Take 1 tablet (20 mg total) by mouth daily. 07/13/17   Mariel Aloe, MD  metFORMIN (GLUCOPHAGE) 1000 MG tablet Take 1 tablet (1,000 mg total) by mouth 2 (two) times daily with a meal. For diabetes management 07/13/17   Mariel Aloe, MD    Family History Family History  Problem Relation Age of Onset  . Diabetes Other   . Hypertension Other   . Diabetes Mother   . Brain cancer Father   . Mental illness Brother   . Drug abuse Brother   . Diabetes Brother     Social History Social History   Tobacco Use  . Smoking status: Current Every Day Smoker    Packs/day: 0.50    Years: 23.00    Pack years: 11.50    Types: Cigarettes  . Smokeless tobacco: Never Used  . Tobacco comment: Every other day   Substance Use Topics  . Alcohol use: Yes  . Drug use: Yes    Types: Cocaine, "Crack" cocaine     Allergies   Patient has no known allergies.   Review of Systems Review of Systems  Constitutional: Negative for appetite change, chills and fever.  Respiratory: Negative for shortness of breath.   Cardiovascular: Negative for chest pain.  Gastrointestinal: Negative for abdominal pain, diarrhea, nausea and vomiting.  Genitourinary: Negative for dysuria.  Musculoskeletal: Negative for back pain.  Skin: Negative for rash.  Allergic/Immunologic: Negative for immunocompromised state.  Neurological: Negative for seizures, syncope, weakness and headaches.  Psychiatric/Behavioral: Positive for  suicidal ideas. Negative for confusion and hallucinations.     Physical Exam Updated Vital Signs BP (!) 128/93 (BP Location: Left Arm)   Pulse (!) 43   Temp 98.5 F (36.9 C) (Oral)   Resp 16   Ht 6' (1.829 m)   Wt 67.1 kg   SpO2 100%   BMI 20.07 kg/m   Physical Exam Vitals signs and nursing note reviewed.  Constitutional:      General: He is not in acute distress.    Appearance: He is well-developed. He is not ill-appearing or toxic-appearing.  HENT:     Head: Normocephalic.  Eyes:     Conjunctiva/sclera: Conjunctivae normal.  Neck:     Musculoskeletal: Neck supple.  Cardiovascular:     Rate and Rhythm: Normal rate and regular rhythm.     Pulses: Normal pulses.     Heart sounds: Normal heart sounds. No murmur. No friction rub. No gallop.   Pulmonary:     Effort: Pulmonary effort is normal. No respiratory  distress.     Breath sounds: No stridor. No wheezing, rhonchi or rales.  Chest:     Chest wall: No tenderness.  Abdominal:     General: There is no distension.     Palpations: Abdomen is soft. There is no mass.     Tenderness: There is no abdominal tenderness. There is no right CVA tenderness, left CVA tenderness, guarding or rebound.     Hernia: No hernia is present.  Skin:    General: Skin is warm and dry.  Neurological:     Mental Status: He is alert.  Psychiatric:        Attention and Perception: He does not perceive auditory or visual hallucinations.        Behavior: Behavior normal.        Thought Content: Thought content includes suicidal ideation. Thought content does not include homicidal ideation. Thought content includes suicidal plan. Thought content does not include homicidal plan.      ED Treatments / Results  Labs (all labs ordered are listed, but only abnormal results are displayed) Labs Reviewed  COMPREHENSIVE METABOLIC PANEL - Abnormal; Notable for the following components:      Result Value   Sodium 131 (*)    Chloride 93 (*)     Glucose, Bld 536 (*)    All other components within normal limits  ACETAMINOPHEN LEVEL - Abnormal; Notable for the following components:   Acetaminophen (Tylenol), Serum <10 (*)    All other components within normal limits  RAPID URINE DRUG SCREEN, HOSP PERFORMED - Abnormal; Notable for the following components:   Cocaine POSITIVE (*)    All other components within normal limits  SARS CORONAVIRUS 2 BY RT PCR (HOSPITAL ORDER, PERFORMED IN [facility])  ETHANOL  SALICYLATE LEVEL  CBC  CBG MONITORING, ED  CBG MONITORING, ED    EKG EKG Interpretation  Date/Time:  Thursday February 08 2019 01:38:37 EDT Ventricular Rate:  111 PR Interval:  134 QRS Duration: 90 QT Interval:  344 QTC Calculation: 467 R Axis:   89 Text Interpretation: Sinus tachycardia with frequent Premature ventricular complexes Right atrial enlargement Minimal voltage criteria for LVH, may be normal variant ( Cornell product ) Nonspecific T wave abnormality Abnormal ECG No significant change since last tracing Confirmed by Drema Pry ([id number]) on 02/08/2019 4:03:48 AM   Radiology No results found.  Procedures Procedures (including critical care time)  Medications Ordered in ED Medications  atorvastatin (LIPITOR) tablet 40 mg (has no administration in time range)  insulin glargine (LANTUS) injection 15 Units (has no administration in time range)  lisinopril (ZESTRIL) tablet 20 mg (has no administration in time range)  metFORMIN (GLUCOPHAGE) tablet 1,000 mg (has no administration in time range)  alum & mag hydroxide-simeth (MAALOX/MYLANTA) 200-200-20 MG/5ML suspension 30 mL (has no administration in time range)  acetaminophen (TYLENOL) tablet 650 mg (has no administration in time range)  insulin glargine (LANTUS) injection 15 Units (has no administration in time range)  insulin aspart (novoLOG) injection 10 Units (has no administration in time range)  sodium chloride 0.9 % bolus 1,000 mL (0 mLs  Intravenous Stopped 02/08/19 0715)     Initial Impression / Assessment and Plan / ED Course  I have reviewed the triage vital signs and the nursing notes.  Pertinent labs & imaging results that were available during my care of the patient were reviewed by me and considered in my medical decision making (see chart for details).  58 year old male with a history of cocaine use disorder, diabetes mellitus type 2, alcohol use disorder, major depressive disorder, and nonischemic cardiomyopathy, and HTN who presents to the emergency department with suicidal thoughts and plan.  He reports that he relapsed on cocaine use over the last few weeks.  Last use was yesterday.  He has no chest pain at this time.  UDS is positive for cocaine.  EKG unchanged from previous.  Glucose is elevated at 536, but anion gap and bicarb are normal.  This is likely secondary to noncompliance with his home medications.  Will order his home dose of Lantus and since he has mild hyponatremia, will treat the patient with a liter of IV fluids and recheck his blood sugar.   Blood pressure improved into the 300s when it was collected med fluid bolus.  The patient has not been given his home dose of Lantus, which has now been ordered.  He will also be given 10 units of NovoLog and have his blood sugar rechecked.  He will be medically cleared once his hyperglycemia is better controlled.  PA Laveda Normanran will follow up on recheck of the patient's glucose following insulin administration to medically clear the patient.  TTS was consulted who recommends inpatient treatment.  Patient remains voluntary at this time. Psych hold orders and home med orders placed. Please see psych team notes for further documentation of care/dispo. Pt stable at time of med clearance.    Final Clinical Impressions(s) / ED Diagnoses   Final diagnoses:  Suicidal ideation  Hyperglycemia  Cocaine use disorder [facility](HCC)    ED Discharge Orders    None        Barkley BoardsMcDonald, Jin Shockley A, PA-C 02/08/19 0802    Nira Connardama, Pedro Eduardo, MD 02/09/19 [phone]

## 2019-02-08 NOTE — BH Assessment (Signed)
Tele Assessment Note   Patient Name: James [name] MRN: [id number] Referring Physician: Pending Location of Patient: [facility] Location of Provider: [place] is an 58 y.o. male presenting with SI with plan to cut neck with razor. Patient reported onset was 2-3 weeks ago after relapsing on cocaine, being sober for 17 months. . Patient reported last usage of cocaine was 1 hour prior to arrival at ED. Patient reported he did not cut neck with razor because he thought about his granddaughters. Patient reported he has not taken medication in 2 weeks, however when he does take his medication it does work. Patient inpatient mental health due to suicide attempt 02/23/2017, patient unable to recall basics. Patient denied self-harming behaviors. Patient is not currently seeing anyone for outpatient mental health services. Patient admitted to [facility] with plan and denied HI and psychosis.   Patient currently resides with wife. They have 3 adult children and grandchildren. Patient reported older brother committed suicide. Patient reported worsening depressive symptoms, crying spells, insomnia, increased anxiety, feelings of worthlessness and guilt. Patient was calm and cooperative during assessment.  Diagnosis: Major depressive disorder  Past Medical History:  Past Medical History:  Diagnosis Date  . Cellulitis and abscess of foot 09/29/2016  . Chronic systolic heart failure ([place])   . Depression   . Diabetes mellitus without complication ([place])   . Essential hypertension   . NICM (nonischemic cardiomyopathy) ([place]) EF 20-25%     Past Surgical History:  Procedure Laterality Date  . CARDIAC CATHETERIZATION N/A 04/30/2016   Procedure: Right/Left Heart Cath and Coronary Angiography;  Surgeon: Nelva Bush, MD;  Location: [facility];  Service: Cardiovascular;  Laterality: N/A;  . FINGER SURGERY      Family History:  Family History  Problem Relation Age of  Onset  . Diabetes Other   . Hypertension Other   . Diabetes Mother   . Brain cancer Father   . Mental illness Brother   . Drug abuse Brother   . Diabetes Brother     Social History:  reports that he has been smoking cigarettes. He has a 11.50 pack-year smoking history. He has never used smokeless tobacco. He reports current alcohol use. He reports current drug use. Drugs: Cocaine and "Crack" cocaine.  Additional Social History:  Alcohol / Drug Use Pain Medications: see MAR Prescriptions: see MAR Over the Counter: see MAR  CIWA: CIWA-Ar BP: (!) 128/93 Pulse Rate: (!) 43 COWS:    Allergies: No Known Allergies  Home Medications: (Not in a hospital admission)   OB/GYN Status:  No LMP for male patient.  General Assessment Data Location of Assessment: [facility] ED TTS Assessment: In system Is this a Tele or Face-to-Face Assessment?: Tele Assessment Is this an Initial Assessment or a Re-assessment for this encounter?: Initial Assessment Patient Accompanied by:: N/A Language Other than English: No Living Arrangements: (family home) What gender do you identify as?: Male Marital status: Married Living Arrangements: Spouse/significant other Can pt return to current living arrangement?: Yes Admission Status: Voluntary Is patient capable of signing voluntary admission?: Yes Referral Source: Self/Family/Friend     Crisis Care Plan Living Arrangements: Spouse/significant other Legal Guardian: Other:(self) Name of Psychiatrist: (none) Name of Therapist: (none)  Education Status Is patient currently in school?: No Is the patient employed, unemployed or receiving disability?: Employed  Risk to self with the past 6 months Suicidal Ideation: Yes-Currently Present Has patient been a risk to self within the past 6 months prior  to admission? : Yes Suicidal Intent: Yes-Currently Present Has patient had any suicidal intent within the past 6 months prior to admission? : No Is patient at  risk for suicide?: Yes Suicidal Plan?: Yes-Currently Present Has patient had any suicidal plan within the past 6 months prior to admission? : Yes Specify Current Suicidal Plan: (plan to overdose on drugs) Access to Means: Yes Specify Access to Suicidal Means: (overdose on drug usage) What has been your use of drugs/alcohol within the last 12 months?: (marijauana ) Previous Attempts/Gestures: Yes How many times?: (1) Other Self Harm Risks: (none reported) Triggers for Past Attempts: Unpredictable Intentional Self Injurious Behavior: None Family Suicide History: No Recent stressful life event(s): Other (Comment)(relapsed on drugs) Persecutory voices/beliefs?: No Depression: Yes Depression Symptoms: Tearfulness, Isolating, Fatigue, Guilt, Loss of interest in usual pleasures, Feeling angry/irritable, Feeling worthless/self pity, Insomnia Substance abuse history and/or treatment for substance abuse?: Yes Suicide prevention information given to non-admitted patients: Not applicable  Risk to Others within the past 6 months Homicidal Ideation: No Does patient have any lifetime risk of violence toward others beyond the six months prior to admission? : No Thoughts of Harm to Others: No Current Homicidal Intent: No Current Homicidal Plan: No Access to Homicidal Means: No Identified Victim: (n/a) History of harm to others?: No Assessment of Violence: None Noted Violent Behavior Description: (none reported) Does patient have access to weapons?: No Criminal Charges Pending?: No Does patient have a court date: No Is patient on probation?: No  Psychosis Hallucinations: None noted Delusions: None noted  Mental Status Report Appearance/Hygiene: Unremarkable Eye Contact: Fair Motor Activity: Freedom of movement Speech: Logical/coherent Level of Consciousness: Quiet/awake, Drowsy Mood: Depressed Affect: Depressed Anxiety Level: Minimal Thought Processes: Coherent, Relevant Judgement:  Impaired Orientation: Person, Place, Time, Situation Obsessive Compulsive Thoughts/Behaviors: None  Cognitive Functioning Concentration: Fair Memory: Recent Intact Is patient IDD: No Insight: Poor Impulse Control: Poor Appetite: Good Have you had any weight changes? : No Change Sleep: No Change Total Hours of Sleep: (5) Vegetative Symptoms: None  ADLScreening [facility] Assessment Services) Patient's cognitive ability adequate to safely complete daily activities?: Yes Patient able to express need for assistance with ADLs?: Yes Independently performs ADLs?: Yes (appropriate for developmental age)  Prior Inpatient Therapy Prior Inpatient Therapy: Yes Prior Therapy Dates: (2 years ago) Prior Therapy Facilty/Provider(s): (none reported) Reason for Treatment: (drug addiction)  Prior Outpatient Therapy Prior Outpatient Therapy: No Does patient have an ACCT team?: No Does patient have Intensive In-House Services?  : No Does patient have [facility] services? : No Does patient have P4CC services?: No  ADL Screening (condition at time of admission) Patient's cognitive ability adequate to safely complete daily activities?: Yes Patient able to express need for assistance with ADLs?: Yes Independently performs ADLs?: Yes (appropriate for developmental age)  [employer]chant [employer] officer (For Healthcare) Does Patient Have a Medical Advance Directive?: No Would patient like information on creating a medical advance directive?: No - Patient declined   Disposition:  Disposition Initial Assessment Completed for this Encounter: Yes  Sherron Flemings, NP, patient meets inpatient criteria. TTS to secure placement.   This service was provided via telemedicine using a 2-way, interactive audio and video technology.  Names of all persons participating in this telemedicine service and their role in this encounter. Name: Zephyr Ridley Role: Patient  Name: Al Corpus Role: TTS Clinician  Name:  Role:   Name:   Role:     Burnetta Sabin 02/08/2019 4:20 AM

## 2019-02-08 NOTE — ED Notes (Signed)
Breakfast ordered 

## 2019-02-08 NOTE — Progress Notes (Signed)
Pt meets inpatient criteria. Referral information has been sent to the following hospitals for review:  [facility]  [place]       Disposition will continue to follow.   Audree Camel, LCSW, [place] Disposition [facility]/TTS [phone]

## 2019-02-08 NOTE — ED Triage Notes (Signed)
Pt reports SI today. Pt reports he had a razor to nhis neck earlier today but didn't hurt himself because of his granddaughters. Pt reports cocaine use about one hour ago. Reports he is supposed to take medications for depression but reports he recently relapsed in his addiction to crack cocaine 2-3 weeks ago after 17 months. Hasn't taken his medications in 2 weeks or so. Denies HI and hallucinations.

## 2019-02-09 LAB — CBG MONITORING, ED
Glucose-Capillary: 175 mg/dL — ABNORMAL HIGH (ref 70–99)
Glucose-Capillary: 300 mg/dL — ABNORMAL HIGH (ref 70–99)
Glucose-Capillary: 321 mg/dL — ABNORMAL HIGH (ref 70–99)

## 2019-02-09 NOTE — ED Notes (Signed)
Dinner tray at bedside

## 2019-02-09 NOTE — ED Notes (Signed)
Breakfast tray at bedside 

## 2019-02-09 NOTE — ED Provider Notes (Signed)
ED psychiatric rounding note  58 year old male who is been in this department for greater than 38 hours at this point.  Presented with suicidal ideations and plan to cut his neck with a razor.  Recently relapsed with cocaine.  He is also noncompliant with home medications.  Patient medically cleared by previous team.  TTS recommended inpatient therapy.  XMIWO-03 negative Salicylate negative CBG today 175 Tylenol level negative Ethanol level negative CBC nonacute CMP showed elevated glucose which has since resolved and hyponatremia, saline bolus given prior UDS positive for cocaine EKG reviewed by Dr. Leonette [facility] without acute changes  Physical Exam  BP 107/63   Pulse 83   Temp 98.2 F (36.8 C) (Oral)   Resp 18   Ht 6' (1.829 m)   Wt 67.1 kg   SpO2 99%   BMI 20.07 kg/m   Physical Exam Constitutional:      General: He is not in acute distress.    Appearance: Normal appearance. He is well-developed. He is not ill-appearing or diaphoretic.  HENT:     Head: Normocephalic and atraumatic.     Right Ear: External ear normal.     Left Ear: External ear normal.     Nose: Nose normal.  Eyes:     General: Vision grossly intact. Gaze aligned appropriately.  Neck:     Musculoskeletal: Normal range of motion.     Trachea: Trachea and phonation normal. No tracheal deviation.  Pulmonary:     Effort: Pulmonary effort is normal. No respiratory distress.  Musculoskeletal: Normal range of motion.  Skin:    General: Skin is warm and dry.  Neurological:     Mental Status: He is alert.     GCS: GCS eye subscore is 4. GCS verbal subscore is 5. GCS motor subscore is 6.     Comments: Speech is clear and goal oriented, follows commands Major Cranial nerves without deficit, no facial droop Moves extremities without ataxia, coordination intact  Psychiatric:        Behavior: Behavior normal.     ED Course/Procedures     Procedures  MDM  3:15 PM: Per RN no news to report.  Patient evaluated  laying comfortably in bed and in no acute distress.  He reports that he is feeling well and enjoyed his lunch.  He states understanding of care plan and has no questions or concerns at this time.  At this time there does not appear to be any evidence of an acute emergency medical condition and the patient remains medically cleared awaiting psychiatric placement.  Note: Portions of this report may have been transcribed using voice recognition software. Every effort was made to ensure accuracy; however, inadvertent computerized transcription errors may still be present.   Deliah Boston, PA-C 02/09/19 1523    Drenda Freeze, MD 02/12/19 [phone]

## 2019-02-09 NOTE — ED Notes (Signed)
Breakfast Ordered 

## 2019-02-09 NOTE — ED Notes (Signed)
Lunch tray at bedside. ?

## 2019-02-09 NOTE — ED Notes (Signed)
Pt denies SI this am

## 2019-02-10 ENCOUNTER — Inpatient Hospital Stay (HOSPITAL_COMMUNITY)
Admission: AD | Admit: 2019-02-10 | Discharge: 2019-02-11 | DRG: 885 | Disposition: A | Discharge status: Home / Self Care | Payer: No Typology Code available for payment source | Source: Intra-hospital | Attending: Psychiatry | Admitting: Psychiatry

## 2019-02-10 ENCOUNTER — Encounter (HOSPITAL_COMMUNITY): Payer: Self-pay | Admitting: *Deleted

## 2019-02-10 ENCOUNTER — Other Ambulatory Visit: Payer: Self-pay

## 2019-02-10 DIAGNOSIS — F17210 Nicotine dependence, cigarettes, uncomplicated: Secondary | ICD-10-CM | POA: Diagnosis present

## 2019-02-10 DIAGNOSIS — I110 Hypertensive heart disease with heart failure: Secondary | ICD-10-CM | POA: Diagnosis present

## 2019-02-10 DIAGNOSIS — F332 Major depressive disorder, recurrent severe without psychotic features: Secondary | ICD-10-CM | POA: Diagnosis present

## 2019-02-10 DIAGNOSIS — Z915 Personal history of self-harm: Secondary | ICD-10-CM | POA: Diagnosis not present

## 2019-02-10 DIAGNOSIS — G4700 Insomnia, unspecified: Secondary | ICD-10-CM | POA: Diagnosis present

## 2019-02-10 DIAGNOSIS — E119 Type 2 diabetes mellitus without complications: Secondary | ICD-10-CM | POA: Diagnosis present

## 2019-02-10 DIAGNOSIS — R45851 Suicidal ideations: Secondary | ICD-10-CM | POA: Diagnosis present

## 2019-02-10 DIAGNOSIS — F1424 Cocaine dependence with cocaine-induced mood disorder: Secondary | ICD-10-CM

## 2019-02-10 LAB — GLUCOSE, CAPILLARY
Glucose-Capillary: 390 mg/dL — ABNORMAL HIGH (ref 70–99)
Glucose-Capillary: 137 mg/dL — ABNORMAL HIGH (ref 70–99)

## 2019-02-10 MED ORDER — ALUM & MAG HYDROXIDE-SIMETH 200-200-20 MG/5ML PO SUSP: 30.0000 mL | Freq: Four times a day (QID) | ORAL | Status: DC | PRN

## 2019-02-10 MED ORDER — ATORVASTATIN CALCIUM 40 MG PO TABS
40.0000 mg | ORAL_TABLET | Freq: Every day | ORAL | Status: DC
  Filled 2019-02-10: qty 1
  Filled 2019-02-10: qty 7
  Filled 2019-02-10 (×2): qty 1

## 2019-02-10 MED ORDER — INSULIN GLARGINE 100 UNIT/ML ~~LOC~~ SOLN
15.0000 [IU] | Freq: Every day | SUBCUTANEOUS | Status: DC
  Administered 2019-02-10: 21:00:00 15 [IU] via SUBCUTANEOUS

## 2019-02-10 MED ORDER — INSULIN ASPART 100 UNIT/ML ~~LOC~~ SOLN
0.0000 [IU] | Freq: Three times a day (TID) | SUBCUTANEOUS | Status: DC
  Administered 2019-02-10: 9 [IU] via SUBCUTANEOUS
  Administered 2019-02-11: 3 [IU] via SUBCUTANEOUS
  Administered 2019-02-11: 2 [IU] via SUBCUTANEOUS

## 2019-02-10 MED ORDER — LISINOPRIL 20 MG PO TABS
20.0000 mg | ORAL_TABLET | Freq: Every day | ORAL | Status: DC
  Filled 2019-02-10: qty 1

## 2019-02-10 MED ORDER — ACETAMINOPHEN 325 MG PO TABS: 650.0000 mg | ORAL_TABLET | ORAL | Status: DC | PRN

## 2019-02-10 MED ORDER — METFORMIN HCL 500 MG PO TABS
1000.0000 mg | ORAL_TABLET | Freq: Two times a day (BID) | ORAL | Status: DC
  Administered 2019-02-10 – 2019-02-11 (×2): 1000 mg via ORAL
  Filled 2019-02-10 (×7): qty 2
  Filled 2019-02-10 (×2): qty 28

## 2019-02-10 MED ORDER — BUPROPION HCL ER (XL) 150 MG PO TB24
150.0000 mg | ORAL_TABLET | Freq: Every day | ORAL | Status: DC
  Administered 2019-02-11: 08:00:00 150 mg via ORAL
  Filled 2019-02-10 (×3): qty 1
  Filled 2019-02-10: qty 7

## 2019-02-10 MED ORDER — CARVEDILOL 25 MG PO TABS
25.0000 mg | ORAL_TABLET | Freq: Two times a day (BID) | ORAL | Status: DC
  Administered 2019-02-10 – 2019-02-11 (×2): 25 mg via ORAL
  Filled 2019-02-10: qty 1
  Filled 2019-02-10: qty 2
  Filled 2019-02-10: qty 1
  Filled 2019-02-10 (×2): qty 14
  Filled 2019-02-10 (×4): qty 1

## 2019-02-10 NOTE — ED Notes (Signed)
IVC paperwork faxed to Magistrate - verified receipt. Magistrate advised he faxed the Findings and Custody but not received. Advised will re-fax to [place] fax in a few.

## 2019-02-10 NOTE — ED Notes (Addendum)
Advised pt of tx plan - continuing w/Inpt. Pt verbalized he is not happy w/this plan d/t he has been in ED x 3 days and he denies SI/HI/AVH. States he was clean from drugs x 17 months and then used crack. States he did not go through [employer] - did it on his own. Repeatedly states he is not SI. States he drove himself to the hospital because he realized he needed help and after sitting here for 3 days, states he is feeling much better after he has gotten some rest. Pt noted to be talking in low voice and cooperative. Farris Has, [facility], NP, aware and continues w/recommendation w/Inpt - advised she will not be assessing pt today.

## 2019-02-10 NOTE — Discharge Summary (Addendum)
  Patient is to be transferred to [facility] for inpatient psychiatric treatment  Attest to NP note

## 2019-02-10 NOTE — ED Notes (Signed)
Gerald Stabs, [facility], advised will notify Farris Has [facility] NP, to call Dr Jeanell Sparrow - 2nd request.

## 2019-02-10 NOTE — ED Notes (Signed)
Pt taking a shower 

## 2019-02-10 NOTE — ED Notes (Signed)
[place], [place], advised will notify Farris Has, [facility] NP, of psychiatry consult and will request for her to call Dr Jeanell Sparrow.

## 2019-02-10 NOTE — Progress Notes (Signed)
   02/10/19 2243  Psych Admission Type (Psych Patients Only)  Admission Status Involuntary  Psychosocial Assessment  Patient Complaints None  Eye Contact Fair  Facial Expression Angry  Affect Appropriate to circumstance  Speech Logical/coherent;Soft  Interaction Assertive  Motor Activity Other (Comment) (WNL)  Appearance/Hygiene Unremarkable  Behavior Characteristics Cooperative  Mood Pleasant  Aggressive Behavior  Targets Self  Type of Behavior Verbal (PTA)  Effect No apparent injury  Thought Process  Coherency WDL  Content Blaming self;Blaming others  Delusions None reported or observed  Perception WDL  Hallucination None reported or observed  Judgment WDL  Confusion None  Danger to Self  Current suicidal ideation? Denies ("I'm not suicidal, I told them that already")  Danger to Others  Danger to Others None reported or observed  D: Patient in bed awake on approach. Pt reports he was upset earlier but happy he is discharging tomorrow.   A: Medications administered as prescribed. Support and encouragement provided as needed.  R: Patient remains safe on the unit. Will continue to monitor for safety and stability.

## 2019-02-10 NOTE — [facility] Suicide Risk Assessment (Signed)
[facility] Admission Suicide Risk Assessment   Nursing information obtained from:  Patient Demographic factors:  Male Current Mental Status:  Self-harm thoughts(PTA "I was intoxicated then, I felt guilty about my relapse") Loss Factors:  NA Historical Factors:  Family history of mental illness or substance abuse Risk Reduction Factors:  Religious beliefs about death, Living with another person, especially a relative, Positive social support  Total Time spent with patient: 45 minutes Principal Problem: Cocaine Use Disorder, Cocaine Induced Mood Disorder /Depressed  Diagnosis:  Active Problems:   MDD (major depressive disorder), recurrent episode, severe (HCC)  Subjective Data:   Continued Clinical Symptoms:  Alcohol Use Disorder Identification Test Final Score (AUDIT): 1 The "Alcohol Use Disorders Identification Test", Guidelines for Use in Primary Care, Second Edition.  World Pharmacologist [facility]). Score between 0-7:  no or low risk or alcohol related problems. Score between 8-15:  moderate risk of alcohol related problems. Score between 16-19:  high risk of alcohol related problems. Score 20 or above:  warrants further diagnostic evaluation for alcohol dependence and treatment.   CLINICAL FACTORS:  58 year old male, presented to ED on 10/29 reporting suicidal thoughts of cutting self with razor.  He had relapsed on cocaine about a week prior and on alcohol about 2 days prior.  Reports history of cocaine use disorder (denies alcohol abuse history), and states he has been sober for 17 months prior to relapsing last week after running into an acquaintance who was actively using drugs.  In the context of relapse he felt acutely discouraged, upset, but denies having had any depressive symptoms prior to his relapse and states "everything was going well".  Reports improved and stable family life, steady job, and generally improved life satisfaction in the context of sobriety. States that suicidal  statements were made in the context of feeling frustrated and guilty about his relapse but denies having had any actual suicidal plan or intent.  Currently denies/minimizes any neurovegetative symptoms, states he is feeling much better, presents future oriented, hoping for discharge soon in order to return to work/not do his job.  He reports he has been on Wellbutrin XL for depression times a year and a half without any side effects and good response.   Psychiatric Specialty Exam: Physical Exam  ROS  Height 6' (1.829 m), weight 67.1 kg.Body mass index is 20.07 kg/m.  See admit note MSE   COGNITIVE FEATURES THAT CONTRIBUTE TO RISK:  Closed-mindedness and Loss of executive function    SUICIDE RISK:   Moderate:  Frequent suicidal ideation with limited intensity, and duration, some specificity in terms of plans, no associated intent, good self-control, limited dysphoria/symptomatology, some risk factors present, and identifiable protective factors, including available and accessible social support.  PLAN OF CARE: Patient will be admitted to inpatient psychiatric unit for stabilization and safety. Will provide and encourage milieu participation. Provide medication management and maked adjustments as needed.  Will follow daily.    I certify that inpatient services furnished can reasonably be expected to improve the patient's condition.   Jenne Campus, MD 02/10/2019, 4:16 PM

## 2019-02-10 NOTE — ED Notes (Signed)
Patient was given a Snack and drink. 

## 2019-02-10 NOTE — ED Notes (Signed)
Pt lying on bed w/eyes closed. Will administer meds when awakens.

## 2019-02-10 NOTE — ED Notes (Signed)
Pt talking w/Sitter. Pt remains calm, pleasant. Aware of delay. IVC papers served - Copy faxed to [facility], Copy sent to Medical Records, Original placed in folder for Magistrate, and ALL 3 sets on clipboard.

## 2019-02-10 NOTE — ED Notes (Signed)
Pt in shower. Black shorts from pt's belongings given to pt as no pants/shorts are available from ED at this time.

## 2019-02-10 NOTE — ED Notes (Signed)
Pt asking for coffee and he wants to leave this am???

## 2019-02-10 NOTE — H&P (Addendum)
Psychiatric Admission Assessment Adult  Patient Identification: James [name] MRN:  [id number] Date of Evaluation:  02/10/2019 Chief Complaint:  " I had been sober for 17 months ago but I recently picked up again" Principal Diagnosis: Substance Induced Mood Disorder . Cocaine Use Disorder. Diagnosis:  Active Problems:   MDD (major depressive disorder), recurrent episode, severe (HCC)  History of Present Illness: 58 year old male, presented to ED voluntarily on 10/29 reporting suicidal ideations with thoughts of cutting his neck with a razor. Explains he relapsed on cocaine a week ago and on alcohol 2 days ago after a long period of abstinence  ( 17 months).  He reports he drank several glasses of wine over the last two days preceding coming to ED . Admission UDS positive for cocaine ,  BAL negative. Explains relapse was triggered by running into an old acquaintance who was actively using cocaine . Currently states " I was just feeling really upset with myself because I relapsed, but I feel a lot better now". He states that suicidal statements he made ( see above) were " just saying stupid stuff because I had been drinking , I never meant to hurt myself , I was just upset I had picked up again ".  States that in the context of sobriety/abstinence he had been doing " really well", with stable family life, stableemployment , and stable mood . States " I was doing fine ".  Reports some neuro-vegetative symptoms of depression as below following relapse, but not before then.  Denies having symptoms of depression prior to relapse. At this time he states he is feeling " a whole lot better" and denies feeling depressed at present. He presents future oriented and is hopeful for discharge soon, stating that he needs to return to work Monday or his job may be in jeopardy. Associated Signs/Symptoms: Depression Symptoms:  depressed mood, insomnia, suicidal thoughts with specific plan, decreased  appetite, (Hypo) Manic Symptoms:  Does not endorse  Anxiety Symptoms:  Denies increased anxiety Psychotic Symptoms:  Denies  PTSD Symptoms: Denies  Total Time spent with patient: 45 minutes  Past Psychiatric History: history of prior admission to [facility] in November of 2018. At the time patient presented with depression, cocaine abuse . Was discharged on Wellbutrin at the time. Reports history of one suicide attempt several years ago by running into traffic. Denies history of psychosis. Denies history of mania or hypomania, denies history of PTSD.  Is the patient at risk to self? Yes.    Has the patient been a risk to self in the past 6 months? Yes.    Has the patient been a risk to self within the distant past? No.  Is the patient a risk to others? No.  Has the patient been a risk to others in the past 6 months? No.  Has the patient been a risk to others within the distant past? No.   Prior Inpatient Therapy:  as above  Prior Outpatient Therapy:  follows up with [facility]  Alcohol Screening:   Substance Abuse History in the last 12 months:  Reports history of cocaine use disorder - had been sober /abstinent x 17 months up to last week. He denies history of alcohol abuse , but states he had been drinking over the last 2 days prior to admission, but not before then.  Consequences of Substance Abuse: Reports that in the context of sobriety x 17 months his quality of life improved significantly with improved family relationships and  has been able to hold steady employment. Previous Psychotropic Medications: Wellbutrin XL 150 mgrs QDAY x more than one year. States he had been taking regularly up to a week ago. . Denies side effects and reports " it has helped me a lot". Denies history of seizures . Psychological Evaluations: No  Past Medical History: DM, HTN, has been diagnosed with CHF in the past . Reports he had been taking Metformin regularly, but not Lantus ,which he states he " sometimes ran out  of ". For HTN was taking Coreg/Lisinopril/Aldactone. Denies history of seizures .  Past Medical History:  Diagnosis Date  . Cellulitis and abscess of foot 09/29/2016  . Chronic systolic heart failure (HCC)   . Depression   . Diabetes mellitus without complication (HCC)   . Essential hypertension   . NICM (nonischemic cardiomyopathy) (HCC) EF 20-25%     Past Surgical History:  Procedure Laterality Date  . CARDIAC CATHETERIZATION N/A 04/30/2016   Procedure: Right/Left Heart Cath and Coronary Angiography;  Surgeon: Yvonne Kendallhristopher End, MD;  [facility] INVASIVE CV LAB;  Service: Cardiovascular;  Laterality: N/A;  . FINGER SURGERY     Family History: parents deceased, three brothers, two sisters. One brother died in a MVA. Family History  Problem Relation Age of Onset  . Diabetes Other   . Hypertension Other   . Diabetes Mother   . Brain cancer Father   . Mental illness Brother   . Drug abuse Brother   . Diabetes Brother    Family Psychiatric  History: denies history of mental illness in family. Brother had PTSD related to serving in [place] , another brother had history of opiate abuse.   Tobacco Screening:  States he quit smoking a few months ago, but relapsed 2 weeks ago Social History: married, lives with wife , employed  Social History   Substance and Sexual Activity  Alcohol Use Yes     Social History   Substance and Sexual Activity  Drug Use Yes  . Types: Cocaine, "Crack" cocaine    Additional Social History:  Allergies:  No Known Allergies Lab Results:  Results for orders placed or performed during the hospital encounter of 02/08/19 (from the past 48 hour(s))  CBG monitoring, ED     Status: Abnormal   Collection Time: 02/08/19  9:27 PM  Result Value Ref Range   Glucose-Capillary 370 (H) 70 - 99 mg/dL   Comment 1 Notify RN    Comment 2 Document in Chart   CBG monitoring, ED     Status: Abnormal   Collection Time: 02/09/19  7:55 AM  Result Value Ref Range    Glucose-Capillary 175 (H) 70 - 99 mg/dL  CBG monitoring, ED     Status: Abnormal   Collection Time: 02/09/19  5:46 PM  Result Value Ref Range   Glucose-Capillary 300 (H) 70 - 99 mg/dL  CBG monitoring, ED     Status: Abnormal   Collection Time: 02/09/19  9:28 PM  Result Value Ref Range   Glucose-Capillary 321 (H) 70 - 99 mg/dL    Blood Alcohol level:  Lab Results  Component Value Date   ETH <10 02/08/2019   ETH <10 07/12/2017    Metabolic Disorder Labs:  Lab Results  Component Value Date   HGBA1C 14.6 (H) 07/12/2017   MPG 372.32 07/12/2017   MPG 329 09/29/2016   Lab Results  Component Value Date   PROLACTIN 10.7 01/16/2017   Lab Results  Component Value Date   CHOL  147 01/16/2017   TRIG 47 01/16/2017   HDL 72 01/16/2017   CHOLHDL 2.0 01/16/2017   VLDL 9 01/16/2017   LDLCALC 66 01/16/2017   LDLCALC 71 04/28/2016    Current Medications: Current Facility-Administered Medications  Medication Dose Route Frequency Provider Last Rate Last Dose  . acetaminophen (TYLENOL) tablet 650 mg  650 mg Oral Q4H PRN Rankin, Shuvon B, NP      . alum & mag hydroxide-simeth (MAALOX/MYLANTA) 200-200-20 MG/5ML suspension 30 mL  30 mL Oral Q6H PRN Rankin, Shuvon B, NP      . [START ON 02/11/2019] atorvastatin (LIPITOR) tablet 40 mg  40 mg Oral Daily Rankin, Shuvon B, NP      . insulin glargine (LANTUS) injection 15 Units  15 Units Subcutaneous QHS Rankin, Shuvon B, NP      . [START ON 02/11/2019] lisinopril (ZESTRIL) tablet 20 mg  20 mg Oral Daily Rankin, Shuvon B, NP      . metFORMIN (GLUCOPHAGE) tablet 1,000 mg  1,000 mg Oral BID WC Rankin, Shuvon B, NP       PTA Medications: Medications Prior to Admission  Medication Sig Dispense Refill Last Dose  . atorvastatin (LIPITOR) 40 MG tablet Take 1 tablet (40 mg total) by mouth daily. For high cholesterol 30 tablet 0   . buPROPion (WELLBUTRIN SR) 150 MG 12 hr tablet Take 150 mg by mouth daily.      Marland Kitchen buPROPion (WELLBUTRIN XL) 150 MG 24 hr  tablet Take 1 tablet (150 mg total) by mouth daily. For depression (Patient not taking: Reported on 02/09/2019) 30 tablet 0   . carvedilol (COREG) 25 MG tablet Take 25 mg by mouth 2 (two) times daily.     . ferrous sulfate 325 (65 FE) MG tablet Take 1 tablet (325 mg total) by mouth 2 (two) times daily with a meal. (Patient not taking: Reported on 02/09/2019) 60 tablet 0   . insulin glargine (LANTUS) 100 UNIT/ML injection Inject 0.15 mLs (15 Units total) into the skin at bedtime. For diabetes management (Patient not taking: Reported on 02/09/2019) 10 mL 0   . insulin NPH-regular Human (70-30) 100 UNIT/ML injection Inject 15-18 Units into the skin 2 (two) times daily with a meal.     . Insulin Syringe-Needle U-100 (INSULIN SYRINGE .3CC/31GX5/16") 31G X 5/16" 0.3 ML MISC Use with lantus, once daily: For diabetes management 100 each 0   . lisinopril (PRINIVIL,ZESTRIL) 20 MG tablet Take 1 tablet (20 mg total) by mouth daily. 30 tablet 0   . metFORMIN (GLUCOPHAGE) 1000 MG tablet Take 1 tablet (1,000 mg total) by mouth 2 (two) times daily with a meal. For diabetes management 60 tablet 0   . naproxen sodium (ALEVE) 220 MG tablet Take 440 mg by mouth 2 (two) times daily as needed (pain/headache).     . sildenafil (VIAGRA) 100 MG tablet Take 100 mg by mouth daily as needed for erectile dysfunction.     Marland Kitchen spironolactone (ALDACTONE) 25 MG tablet Take 25 mg by mouth daily.       Musculoskeletal: Strength & Muscle Tone: within normal limits no tremors, no diaphoresis, no restlessness or agitation at this time  Gait & Station: normal Patient leans: N/A  Psychiatric Specialty Exam: Physical Exam  Review of Systems  Constitutional: Positive for weight loss.  HENT: Negative.   Eyes: Negative.   Respiratory: Negative for cough and shortness of breath.   Cardiovascular: Negative for chest pain.  Gastrointestinal: Negative for nausea and vomiting.  Genitourinary: Negative.  Musculoskeletal: Negative.    Skin: Negative for rash.  Neurological: Negative.  Negative for seizures and headaches.  Endo/Heme/Allergies: Negative.   Psychiatric/Behavioral: Positive for depression and substance abuse.    There were no vitals taken for this visit.There is no height or weight on file to calculate BMI.  General Appearance: Well Groomed  Eye Contact:  Good  Speech:  Normal Rate  Volume:  Normal  Mood:  states " I am feeling a lot better now"  Affect:  appropriate, reactive   Thought Process:  Linear and Descriptions of Associations: Intact  Orientation:  Other:  fully alert and attentive  Thought Content:  no hallucinations, no delusions   Suicidal Thoughts:  No denies suicidal or self injurious ideations, presents future oriented   Homicidal Thoughts:  No  Memory:  recent and remote grossly intact   Judgement:  Other:  fair/improving   Insight:  Fair  Psychomotor Activity:  Normal  Concentration:  Concentration: Good and Attention Span: Good  Recall:  Good  Fund of Knowledge:  Good  Language:  Good  Akathisia:  Negative  Handed:  Right  AIMS (if indicated):     Assets:  Communication Skills Desire for Improvement Resilience Others:  employment  ADL's:  Intact  Cognition:  WNL  Sleep:       Treatment Plan Summary: Daily contact with patient to assess and evaluate symptoms and progress in treatment, Medication management, Plan inpatient treatment  and medications as below  Observation Level/Precautions:  15 minute checks  Laboratory:  As needed   Psychotherapy:  Milieu, group therapy   Medications:  We reviewed treatment options. Patient states that Wellbutrin was effective, well tolerated and that it has helped him be depression free and function better over the last 1+ year. Denies having had side effects. He is aware of potential interactions with cocaine and potential cardiovascular side effects but states he did not have any side effects. Based on history of good response wants to  continue Wellbutrin . Diabetes and HTN management reviewed with hospitalist consultant. As BP low  Normal at this time ( and has not taken antihypertensive medications for several days) recommendation is to resume Coreg only .  Consultations: hospitalist consulted - see above   Discharge Concerns: patient reports he is hoping for discharge soon as he needs to return to work   Estimated LOS:3 days   Other:     Physician Treatment Plan for Primary Diagnosis:  Cocaine Use Disorder Long Term Goal(s): Improvement in symptoms so as ready for discharge  Short Term Goals: Ability to identify changes in lifestyle to reduce recurrence of condition will improve and Ability to identify triggers associated with substance abuse/mental health issues will improve  Physician Treatment Plan for Secondary Diagnosis: Cocaine Induced Mood Disorder versus MDD Long Term Goal(s): Improvement in symptoms so as ready for discharge  Short Term Goals: Ability to identify changes in lifestyle to reduce recurrence of condition will improve, Ability to verbalize feelings will improve, Ability to disclose and discuss suicidal ideas, Ability to demonstrate self-control will improve, Ability to identify and develop effective coping behaviors will improve, Ability to maintain clinical measurements within normal limits will improve and Compliance with prescribed medications will improve  I certify that inpatient services furnished can reasonably be expected to improve the patient's condition.    Craige Cotta, MD 10/31/20203:20 PM

## 2019-02-10 NOTE — Tx Team (Signed)
Initial Treatment Plan 02/10/2019 4:07 PM James [name] [id number]    PATIENT STRESSORS: Medication change or noncompliance Substance abuse   PATIENT STRENGTHS: Capable of independent living Communication skills Motivation for treatment/growth Religious Affiliation Supportive family/friends Work skills   PATIENT IDENTIFIED PROBLEMS: Alteration in mood "I relapsed on cocaine, I felt really bad but I also suffer from depression"  Substance Abuse "I've been using cocaine on and off for years, I was doing fine since May 2019 till last Wednesday".                   DISCHARGE CRITERIA:  Improved stabilization in mood, thinking, and/or behavior Verbal commitment to aftercare and medication compliance  PRELIMINARY DISCHARGE PLAN: Outpatient therapy Return to previous living arrangement Return to previous work or school arrangements  PATIENT/FAMILY INVOLVEMENT: This treatment plan has been presented to and reviewed with the patient, James [name]. The patient have been given the opportunity to ask questions and make suggestions.  Keane Police, RN 02/10/2019, 4:07 PM

## 2019-02-10 NOTE — ED Notes (Addendum)
Pt voiced understanding and agreement w/tx plan - Accepted to [facility] 306-1 - ALL belongings - 1 labeled belongings bag from [place] #4 - obtained - to be given to [employer]. No Valuables Envelope w/Security.

## 2019-02-10 NOTE — BH Assessment (Signed)
[place] Assessment Progress Note    Patient was seen for reassessment.  Patient states that he is feeling much better, his head is clear now and states that he is now able to contract for safety.  Patient states that he has been to [facility] in the past and he states that he would come into the hospital if he really thought he needed to.  He states that he was distraught because of his relapse and losing 17 months of sobriety.  Patient states that he has been in contact with his wife and she is supportive of him.  Patient states that he would like to go home.  He states, "I am the one who drove myself here and I would come back if things got bad again."  TTS contacted patient's wife, Rayven Hendrickson [phone], for collateral information.  She states that she does not feel like patient is okay to return home.  She states that he will most likely just return to the streets and begin using again.  She states that he text her the other night that he "couldn't take it anymore and tell the grand-kids that I love them."  She thinks that he needs hospitalization in order to get stabilized.

## 2019-02-10 NOTE — ED Notes (Signed)
Carb Modified Diet was ordered for Lunch. 
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
## 2019-02-10 NOTE — ED Notes (Signed)
Breakfast ordered 

## 2019-02-10 NOTE — ED Notes (Signed)
Shuvon, [facility] NP, and [facility], advised pt has been accepted to [facility]. Can come now.

## 2019-02-10 NOTE — Progress Notes (Signed)
Per Ethelda Chick, pt has been accepted to [facility] bed 306-01. Accepting provider is Delphia Grates, NP. Attending provider is Dr. Parke Poisson. Patient can arrive by 1:00pm. Number for report is [phone]. Patient is negative for COVID-19.   CSW contacted patient's nurse, Jacqlyn Larsen, RN [phone]) to inform that the patient was accepted and gave additional information.     Ardelle Anton, MSW, LCSW Clinical Social Worker II/Disposition [facility]  Phone: [phone] Fax: [phone]

## 2019-02-11 DIAGNOSIS — F1424 Cocaine dependence with cocaine-induced mood disorder: Secondary | ICD-10-CM

## 2019-02-11 LAB — GLUCOSE, CAPILLARY
Glucose-Capillary: 181 mg/dL — ABNORMAL HIGH (ref 70–99)
Glucose-Capillary: 209 mg/dL — ABNORMAL HIGH (ref 70–99)

## 2019-02-11 MED ORDER — ATORVASTATIN CALCIUM 40 MG PO TABS: 40.0000 mg | ORAL_TABLET | Freq: Every day | ORAL | 0 refills | Status: DC

## 2019-02-11 MED ORDER — LISINOPRIL 10 MG PO TABS: 10.0000 mg | ORAL_TABLET | Freq: Every day | ORAL | 0 refills | Status: DC

## 2019-02-11 MED ORDER — LISINOPRIL 10 MG PO TABS
10.0000 mg | ORAL_TABLET | Freq: Every day | ORAL | Status: DC
  Filled 2019-02-11: qty 7
  Filled 2019-02-11 (×3): qty 1

## 2019-02-11 MED ORDER — BUPROPION HCL ER (XL) 150 MG PO TB24: 150.0000 mg | ORAL_TABLET | Freq: Every day | ORAL | 0 refills | Status: DC

## 2019-02-11 MED ORDER — METFORMIN HCL 1000 MG PO TABS: 1000.0000 mg | ORAL_TABLET | Freq: Two times a day (BID) | ORAL | 0 refills | Status: DC

## 2019-02-11 MED ORDER — CARVEDILOL 25 MG PO TABS: 25.0000 mg | ORAL_TABLET | Freq: Two times a day (BID) | ORAL | 0 refills | Status: DC

## 2019-02-11 MED ORDER — INSULIN GLARGINE 100 UNIT/ML ~~LOC~~ SOLN: 15.0000 [IU] | Freq: Every day | SUBCUTANEOUS | 0 refills | Status: DC

## 2019-02-11 NOTE — Discharge Summary (Addendum)
Physician Discharge Summary Note  Patient:  James [name] is an 58 y.o., male MRN:  [id number] DOB:  1960/11/20 Patient phone:  [phone] (home)  Patient address:   [address] [place] [address],  Total Time spent with patient: 15 minutes  Date of Admission:  02/10/2019 Date of Discharge: 02/11/19  Reason for Admission:  Relapse on cocaine and alcohol with suicidal ideation  Principal Problem: <principal problem not specified> Discharge Diagnoses: Active Problems:   MDD (major depressive disorder), recurrent episode, severe (HCC)   Past Psychiatric History: history of prior admission to [facility] in November of 2018. At the time patient presented with depression, cocaine abuse . Was discharged on Wellbutrin at the time. Reports history of one suicide attempt several years ago by running into traffic. Denies history of psychosis. Denies history of mania or hypomania, denies history of PTSD.  Past Medical History:  Past Medical History:  Diagnosis Date  . Cellulitis and abscess of foot 09/29/2016  . Chronic systolic heart failure (HCC)   . Depression   . Diabetes mellitus without complication (HCC)   . Essential hypertension   . NICM (nonischemic cardiomyopathy) (HCC) EF 20-25%     Past Surgical History:  Procedure Laterality Date  . CARDIAC CATHETERIZATION N/A 04/30/2016   Procedure: Right/Left Heart Cath and Coronary Angiography;  Surgeon: Yvonne Kendall, MD;  Location: [facility] INVASIVE CV LAB;  Service: Cardiovascular;  Laterality: N/A;  . FINGER SURGERY     Family History:  Family History  Problem Relation Age of Onset  . Diabetes Other   . Hypertension Other   . Diabetes Mother   . Brain cancer Father   . Mental illness Brother   . Drug abuse Brother   . Diabetes Brother    Family Psychiatric  History: denies history of mental illness in family. Brother had PTSD related to serving in [place] War , another brother had history of opiate abuse.  Social History:   Social History   Substance and Sexual Activity  Alcohol Use Yes     Social History   Substance and Sexual Activity  Drug Use Yes  . Types: Cocaine, "Crack" cocaine    Social History   Socioeconomic History  . Marital status: Married    Spouse name: Not on file  . Number of children: Not on file  . Years of education: Not on file  . Highest education level: Not on file  Occupational History  . Not on file  Social Needs  . Financial resource strain: Not on file  . Food insecurity    Worry: Not on file    Inability: Not on file  . Transportation needs    Medical: Not on file    Non-medical: Not on file  Tobacco Use  . Smoking status: Current Every Day Smoker    Packs/day: 0.50    Years: 23.00    Pack years: 11.50    Types: Cigarettes  . Smokeless tobacco: Never Used  . Tobacco comment: Every other day   Substance and Sexual Activity  . Alcohol use: Yes  . Drug use: Yes    Types: Cocaine, "Crack" cocaine  . Sexual activity: Not Currently  Lifestyle  . Physical activity    Days per week: Not on file    Minutes per session: Not on file  . Stress: Not on file  Relationships  . Social Musician on phone: Not on file    Gets together: Not on file  Attends religious service: Not on file    Active member of club or organization: Not on file    Attends meetings of clubs or organizations: Not on file    Relationship status: Not on file  Other Topics Concern  . Not on file  Social History Narrative  . Not on file    Hospital Course:  From admission H&P: 58 year old male, presented to ED voluntarily on 10/29 reporting suicidal ideations with thoughts of cutting his neck with a razor. Explains he relapsed on cocaine a week ago and on alcohol 2 days ago after a long period of abstinence  ( 17 months).  He reports he drank several glasses of wine over the last two days preceding coming to ED . Admission UDS positive for cocaine ,  BAL negative. Explains  relapse was triggered by running into an old acquaintance who was actively using cocaine. Currently states " I was just feeling really upset with myself because I relapsed, but I feel a lot better now". He states that suicidal statements he made ( see above) were " just saying stupid stuff because I had been drinking , I never meant to hurt myself , I was just upset I had picked up again ". States that in the context of sobriety/abstinence he had been doing " really well", with stable family life, stableemployment , and stable mood . States " I was doing fine ". Reports some neuro-vegetative symptoms of depression as below following relapse, but not before then.  Denies having symptoms of depression prior to relapse. At this time he states he is feeling " a whole lot better" and denies feeling depressed at present. He presents future oriented and is hopeful for discharge soon, stating that he needs to return to work Monday or his job may be in jeopardy.  [name] was admitted for relapse on alcohol and cocaine with suicidal ideation. He reported sobriety since May 2019 prior to recent relapse. [facility]. Wellbutrin was continued. He participated in group therapy on the unit. He has shown stable mood, affect, sleep, and interaction. On day of discharge, he presents with euthymic affect and reports stable mood. He is future-oriented, with plans to return to sobriety. He expresses remorse for impact of relapse on his family. He expresses motivation for sobriety to take care of himself as well as for his family, particularly his grandchildren. He denies any SI/HI/AVH and contracts for safety. He denies withdrawal symptoms. He is discharging on the medications listed below. [facility], and [facility] (see below). He is discharging home via his own vehicle.  Physical Findings: AIMS: Facial  and Oral Movements Muscles of Facial Expression: None, normal Lips and Perioral Area: None, normal Jaw: None, normal Tongue: None, normal,Extremity Movements Upper (arms, wrists, hands, fingers): None, normal Lower (legs, knees, ankles, toes): None, normal, Trunk Movements Neck, shoulders, hips: None, normal, Overall Severity Severity of abnormal movements (highest score from questions above): None, normal Incapacitation due to abnormal movements: None, normal Patient's awareness of abnormal movements (rate only patient's report): No Awareness, Dental Status Current problems with teeth and/or dentures?: No Does patient usually wear dentures?: No  CIWA:    COWS:     Musculoskeletal: Strength & Muscle Tone: within normal limits Gait & Station: normal Patient leans: N/A  Psychiatric Specialty Exam: Physical Exam  Nursing note and vitals reviewed.  Constitutional: He is oriented to person, place, and time. He appears well-developed and well-nourished.  Cardiovascular: Normal rate.  Respiratory: Effort normal.  Neurological: He is alert and oriented to person, place, and time.    Review of Systems  Constitutional: Negative.   Respiratory: Negative for cough and shortness of breath.   Cardiovascular: Negative for chest pain.  Gastrointestinal: Negative for abdominal pain, diarrhea, nausea and vomiting.  Neurological: Negative for tremors, sensory change and headaches.  Psychiatric/Behavioral: Positive for depression (stable on medication) and substance abuse. Negative for hallucinations and suicidal ideas. The patient is not nervous/anxious and does not have insomnia.     Blood pressure (!) 138/110, pulse (!) 101, temperature 98.2 F (36.8 C), temperature source Oral, resp. rate 18, height 6' (1.829 m), weight 67.1 kg, SpO2 100 %.Body mass index is 20.07 kg/m.  See MD's discharge SRA    Have you used any form of tobacco in the last 30 days? (Cigarettes, Smokeless Tobacco, Cigars,  and/or Pipes): Yes  Has this patient used any form of tobacco in the last 30 days? (Cigarettes, Smokeless Tobacco, Cigars, and/or Pipes)  No  Blood Alcohol level:  Lab Results  Component Value Date   ETH <10 02/08/2019   ETH <10 07/12/2017    Metabolic Disorder Labs:  Lab Results  Component Value Date   HGBA1C 14.6 (H) 07/12/2017   MPG 372.32 07/12/2017   MPG 329 09/29/2016   Lab Results  Component Value Date   PROLACTIN 10.7 01/16/2017   Lab Results  Component Value Date   CHOL 147 01/16/2017   TRIG 47 01/16/2017   HDL 72 01/16/2017   CHOLHDL 2.0 01/16/2017   VLDL 9 01/16/2017   LDLCALC 66 01/16/2017   LDLCALC 71 04/28/2016    See Psychiatric Specialty Exam and Suicide Risk Assessment completed by Attending Physician prior to discharge.  Discharge destination:  Home  Is patient on multiple antipsychotic therapies at discharge:  No   Has Patient had three or more failed trials of antipsychotic monotherapy by history:  No  Recommended Plan for Multiple Antipsychotic Therapies: NA  Discharge Instructions    Discharge instructions   Complete by: As directed    Patient is instructed to take all prescribed medications as recommended. Report any side effects or adverse reactions to your outpatient psychiatrist. Patient is instructed to abstain from alcohol and illegal drugs while on prescription medications. In the event of worsening symptoms, patient is instructed to call the crisis hotline, 911, or go to the nearest emergency department for evaluation and treatment.     Allergies as of 02/11/2019   No Known Allergies     Medication List    STOP taking these medications   ferrous sulfate 325 (65 FE) MG tablet   insulin NPH-regular Human (70-30) 100 UNIT/ML injection   INSULIN SYRINGE .3CC/31GX5/16" 31G X 5/16" 0.3 ML Misc   naproxen sodium 220 MG tablet Commonly known as: ALEVE   sildenafil 100 MG tablet Commonly known as: VIAGRA   spironolactone 25 MG  tablet Commonly known as: ALDACTONE     TAKE these medications     Indication  atorvastatin 40 MG tablet Commonly known as: LIPITOR Take 1 tablet (40 mg total) by mouth daily. For high cholesterol  Indication: High Amount of Fats in the Blood   buPROPion 150 MG 24 hr tablet Commonly known as: WELLBUTRIN XL Take 1 tablet (150 mg total) by mouth daily. Start taking on: February 12, 2019 What changed:   additional instructions  Another medication with the same name was removed. Continue taking this medication, and follow the directions you see here.  Indication: Major Depressive Disorder   carvedilol 25 MG tablet Commonly known as: COREG Take 1 tablet (25 mg total) by mouth 2 (two) times daily with a meal. What changed: when to take this  Indication: High Blood Pressure Disorder   insulin glargine 100 UNIT/ML injection Commonly known as: LANTUS Inject 0.15 mLs (15 Units total) into the skin at bedtime. For diabetes management  Indication: Type 2 Diabetes   lisinopril 10 MG tablet Commonly known as: ZESTRIL Take 1 tablet (10 mg total) by mouth daily. What changed:   medication strength  how much to take  Indication: High Blood Pressure Disorder   metFORMIN 1000 MG tablet Commonly known as: GLUCOPHAGE Take 1 tablet (1,000 mg total) by mouth 2 (two) times daily with a meal. For diabetes management  Indication: Type 2 Diabetes      Follow-up Information    [facility] Follow up.   Why: Please resume outpatient medication management with Audrea Muscat Placey through the [facility] clinic. (Call on Monday 11/2 to verify next appt for this month if you do not already have this information).  Contact information: [place] [phone]        [facility] Follow up.   Specialty: Professional Counselor Why: Please walk-in within three days of hospital discharge to complete intake for group therapy. Walk in  hours are Monday-Friday between 8am-12pm.  Contact information: [place] [address] [phone]        [facility] Follow up.   Why: [facility] and pharmacy for uninsured. Please call to schedule an appointment. Contact information: [address] [place] [address] [phone]          Follow-up recommendations: Activity as tolerated. Diet as recommended by primary care physician. Keep all scheduled follow-up appointments as recommended.   Comments:   Patient is instructed to take all prescribed medications as recommended. Report any side effects or adverse reactions to your outpatient psychiatrist. Patient is instructed to abstain from alcohol and illegal drugs while on prescription medications. In the event of worsening symptoms, patient is instructed to call the crisis hotline, 911, or go to the nearest emergency department for evaluation and treatment.  Signed: Connye Burkitt, NP 02/11/2019, 11:06 AM   Patient seen, Suicide Assessment Completed.  Disposition Plan Reviewed

## 2019-02-11 NOTE — Progress Notes (Signed)
  [facility] Adult Case Management Discharge Plan :  Will you be returning to the same living situation after discharge:  Yes,  home with wife At discharge, do you have transportation home?: Yes,  his truck is parked at [facility] parking lot. Pt will need security to take him to [facility] parking lot at discharge.  Do you have the ability to pay for your medications: Yes,  mental health/self pay  Release of information consent forms completed and submitted to medical records by CSW.   Patient to Follow up at: Follow-up Information    [facility] Follow up.   Why: Please resume outpatient medication management with Marliss Coots, NP through the [facility] clinic. (Call on Monday 11/2 to verify next appt for this month if you do not already have this information).  Contact information: [place] [address] [phone]        [facility] Follow up.   Specialty: Professional Counselor Why: Please walk-in within three days of hospital discharge to complete intake for group therapy. Walk in hours are Monday-Friday between 8am-12pm.  Contact information: [place] [address] [phone]        [facility] Follow up.   Why: [facility] care clinic and pharmacy for uninsured. Please call to schedule an appointment. Contact information: [address] [place] [address] [phone]          Next level of care provider has access to [place] and Suicide Prevention discussed: Yes,  SPE completed with pt; pt declined to consent to collateral contact.   Have you used any form of tobacco in the last 30 days? (Cigarettes, Smokeless Tobacco, Cigars, and/or Pipes): Yes  Has patient been referred to the Quitline?: Patient refused referral  Patient has been referred for addiction treatment: Yes  Avelina Laine,  LCSW 02/11/2019, 12:11 PM

## 2019-02-11 NOTE — BHH Suicide Risk Assessment (Addendum)
[facility] Discharge Suicide Risk Assessment   Principal Problem: Cocaine use disorder, substance-induced mood disorder Discharge Diagnoses: Active Problems:   MDD (major depressive disorder), recurrent episode, severe (HCC)   Total Time spent with patient: 30 minutes  Musculoskeletal: Strength & Muscle Tone: within normal limits Gait & Station: normal Patient leans: N/A  Psychiatric Specialty Exam: ROS denies headache, no chest pain, no shortness breath, nausea, no vomiting, chills  Blood pressure (!) 138/110, pulse (!) 101, temperature 98.2 F (36.8 C), temperature source Oral, resp. rate 18, height 6' (1.829 m), weight 67.1 kg, SpO2 100 %.Body mass index is 20.07 kg/m.  General Appearance: Improving grooming  Eye Contact::  Good  Speech:  Normal Rate409  Volume:  Normal  Mood:  Denies depression, states "I feel okay now"  Affect:  Reactive, appropriate  Thought Process:  Linear and Descriptions of Associations: Intact  Orientation:  Full (Time, Place, and Person)  Thought Content:  Denies hallucinations, no delusions  Suicidal Thoughts:  No denies any suicidal or self-injurious ideations and presents future oriented, focused on returning to work soon  Homicidal Thoughts:  No denies any homicidal or violent ideations  Memory:  Recent and remote grossly intact  Judgement:  Other:  Improving  Insight:  Fair/improving  Psychomotor Activity:  Normal  Concentration:  Good  Recall:  Good  Fund of Knowledge:Good  Language: Good  Akathisia:  Negative  Handed:  Right  AIMS (if indicated):     Assets:  Communication Skills Desire for Improvement Resilience  Sleep:     Cognition: WNL  ADL's:  Intact   Mental Status Per Nursing Assessment::   On Admission:  Self-harm thoughts(PTA "I was intoxicated then, I felt guilty about my relapse")  Demographic Factors:  58 year old married male  Loss Factors: Recent relapse on cocaine following a long period of  sobriety/abstinence  Historical Factors: History of substance use disorder.  (Cocaine identified as substance of choice).  History of depression.  Risk Reduction Factors:   Sense of responsibility to family, Employed, Living with another person, especially a relative and Positive coping skills or problem solving skills  Continued Clinical Symptoms:  At this time patient presents home, providing approach, without restlessness or distress, describes mood as improved/normalized, denies feeling depressed.  Affect is appropriate and reactive.  No thought disorders noted.  Denies SI and presents future oriented, denies HI.  No psychotic symptoms. As on admission, states that he had felt frustrated and guilty about his recent relapse but that now he is feeling a lot better and "ready to get back to my life and my job".  Points out that he was doing very well during his long-term sobriety .  States he plans to return to regular church activities which he states have been a significant part of his recovery effort.  He is also encouraged to consider 12-step program participation.  He states he plans to stay away from people, places, situations that he associates with drug use in order to minimize triggers for relapse. With his expressed consent I spoke with his wife via phone.  She corroborates his recent relapse and that prior to relapse he had been doing well/was relatively stable.  She worries about risk of him relapsing on cocaine again.  She agrees with discharge today. Patient is on Wellbutrin XL.  He states he has been on this medication for more than a year without side effects.  We reviewed side effect profile to include risk of seizures and possible cardiovascular side effects.  He is also aware that this medication might have drug drug interactions with illicit substances including cocaine.  He states that he has done better on this antidepressant than "ever before" without any side effects and wants to  continue taking it. History of hypertension-was taking spironolactone, lisinopril, carvedilol combination prior to admission.  As per hospitalist recommendation was continued on Coreg.   Have also restarted lisinopril at lower dose.  Reports he is tolerating these medications well without side effects.  Denies dizziness or lightheadedness. Have also reviewed potential interactions between his current antihypertensive regimen and cocaine. No disruptive or agitated behaviors on unit Cognitive Features That Contribute To Risk:  No gross cognitive deficits noted upon discharge. Is alert , attentive, and oriented x 3    Suicide Risk:  Mild:  Suicidal ideation of limited frequency, intensity, duration, and specificity.  There are no identifiable plans, no associated intent, mild dysphoria and related symptoms, good self-control (both objective and subjective assessment), few other risk factors, and identifiable protective factors, including available and accessible social support.  Follow-up Information    [facility] Follow up.   Why: Please resume outpatient medication management with Marliss Coots, NP through the [facility] clinic. (Call on Monday 11/2 to verify next appt for this month if you do not already have this information).  Contact information: [place] [address] [phone]        [facility] Follow up.   Specialty: Professional Counselor Why: Please walk-in within three days of hospital discharge to complete intake for group therapy. Walk in hours are Monday-Friday between 8am-12pm.  Contact information: [place] [address] [phone]        [facility] Follow up.   Why: [facility] primary care clinic and pharmacy for uninsured. Please call to schedule an appointment. Contact information: [place]  [phone]          Plan Of Care/Follow-up recommendations:  Activity:  As tolerated Diet:  Heart healthy/diabetic diet Tests:  NA Other:  See below  Patient is expressing readiness for discharge and based on above presentation there are no current/ongoing grounds for involuntary commitment/admission.  He plans to follow-up as above.  As above, he also states he plans to attend church activities regularly and I encouraged him to consider 12-step program participation on a regular basis as well. -Patient reports he has an established PCP for ongoing medical management as needed. Jenne Campus, MD 02/11/2019, 1:31 PM

## 2019-02-11 NOTE — BHH Group Notes (Signed)
[facility] LCSW Group Therapy Note  02/11/2019  10:00-11:00AM  Type of Therapy and Topic:  Group Therapy:  A Hero Worthy of Support  Participation Level:  Active   Description of Group:  Patients in this group were introduced to the concept that additional supports including self-support are an essential part of recovery.  Matching needs with supports to help fulfill those needs was explained.  A song "I Got To Live" was played for the group and was followed by a discussion of what it meant to participants.   The consensus was that the message was to give themselves permission to see happiness in life.  A song entitled "My Own Hero" was played and a group discussion ensued in which patients stated it inspired them to help themselves in order to succeed, because other people cannot achieve their goals such as sobriety or stability for them.  A song was played called "I Am Enough" which led to a discussion about being willing to believe we are worth the effort of being a self-support.   Therapeutic Goals: 1)  demonstrate the importance of being a key part of one's own support system 2)  discuss various available supports 3)  encourage patient to use music as part of their self-support and focus on goals 4)  elicit ideas from patients about supports that need to be added   Summary of Patient Progress:  The patient expressed at length that he has a good marriage with a loving wife, but when he relapses, she does not understand and thinks he chooses to be sick.  He got much acknowledgement from the group that this is common.  He participated fully and with insight.  Therapeutic Modalities:   Motivational Interviewing Activity  Maretta Los

## 2019-02-11 NOTE — BHH Suicide Risk Assessment (Signed)
[place] INPATIENT:  Family/Significant Other Suicide Prevention Education  Suicide Prevention Education:  Patient Refusal for Family/Significant Other Suicide Prevention Education: The patient James [name] has refused to provide written consent for family/significant other to be provided Family/Significant Other Suicide Prevention Education during admission and/or prior to discharge.  Physician notified.  SPE completed with pt, as pt refused to consent to family contact. SPI pamphlet provided to pt and pt was encouraged to share information with support network, ask questions, and talk about any concerns relating to SPE. Pt denies access to guns/firearms and verbalized understanding of information provided. Mobile Crisis information also provided to pt.    Avelina Laine LCSW 02/11/2019, 11:17 AM

## 2019-02-11 NOTE — Progress Notes (Signed)
   02/11/19 1000  Psych Admission Type (Psych Patients Only)  Admission Status Involuntary  Psychosocial Assessment  Patient Complaints None  Eye Contact Fair  Facial Expression Flat  Affect Appropriate to circumstance  Speech Logical/coherent;Soft  Interaction Assertive  Motor Activity Other (Comment) (WNL)  Appearance/Hygiene Unremarkable  Behavior Characteristics Cooperative;Appropriate to situation  Mood Pleasant  Aggressive Behavior  Type of Behavior  (PTA)  Effect No apparent injury  Thought Process  Coherency WDL  Content WDL  Delusions None reported or observed  Perception WDL  Hallucination None reported or observed  Judgment WDL  Confusion None  Danger to Self  Current suicidal ideation? Denies ("I'm not suicidal, I told them that already")  Danger to Others  Danger to Others None reported or observed

## 2019-02-11 NOTE — Progress Notes (Signed)
[place] NOVEL CORONAVIRUS (COVID-19) DAILY CHECK-OFF SYMPTOMS - answer yes or no to each - every day NO YES  Have you had a fever in the past 24 hours?  . Fever (Temp > 37.80C / 100F) X   Have you had any of these symptoms in the past 24 hours? . New Cough .  Sore Throat  .  Shortness of Breath .  Difficulty Breathing .  Unexplained Body Aches   X   Have you had any one of these symptoms in the past 24 hours not related to allergies?   . Runny Nose .  Nasal Congestion .  Sneezing   X   If you have had runny nose, nasal congestion, sneezing in the past 24 hours, has it worsened?  X   EXPOSURES - check yes or no X   Have you traveled outside the state in the past 14 days?  X   Have you been in contact with someone with a confirmed diagnosis of COVID-19 or PUI in the past 14 days without wearing appropriate PPE?  X   Have you been living in the same home as a person with confirmed diagnosis of COVID-19 or a PUI (household contact)?    X   Have you been diagnosed with COVID-19?    X              What to do next: Answered NO to all: Answered YES to anything:   Proceed with unit schedule Follow the [facility] Inpatient Flowsheet.   

## 2019-02-11 NOTE — BHH Counselor (Signed)
Adult Comprehensive Assessment  Patient ID: James [name], male   DOB: 05-15-1960, 58 y.o.   MRN: [id number]  Information Source: Information source: Patient  Current Stressors:  Patient states their primary concerns and needs for treatment are:: recent relapse on cocaine and alcohol. Feelings of guilt/shame/passive SI upon admission. (no longer endorsing SI). Patient states their goals for this hospitilization and ongoing recovery are:: "To get linked back with [facility] for therapy." Educational / Learning stressors:Denies stressors Employment / Job issues:working at a Scientist, product/process development. Family Relationships:Each time his family starts to trust him, he lets them down again. Wife thinks this will never stop. "My granddaughters keep me grounded. I have five under age 49." "Everybody suffers because of my mistakes." Financial / Lack of resources (include bankruptcy): Financial stressorsare significant due to part time intermittent work; wife working.  Housing / Lack of housing:Lives with his wife Physical health (include injuries & life threatening diseases):Diabetes which is somewhat managed. Difficulty getting insulin but is able to get Metformin at [facility] Substance abuse:Relapsed on crack cocaine and alcohol after 74mo sobriety.  Bereavement / Loss:none recent.   Living/Environment/Situation:  Living Arrangements: Spouse/significant other Living conditions (as described by patient or guardian):patient currenlty lives with his wife in [place]  How long has patient lived in current situation?: 20years What is atmosphere in current home:currently chaotic. Wife is upset with him.   Family History:  Marital status: Married Number of Years Married: 38years  What types of issues is patient dealing with in the relationship?: Per patient, his drug use is the major struggle in his relationship. May be getting separated Additional relationship  information: none Does patient have children?: Yes How many children?: 3  How is patient's relationship with their children?: very close, however relationship now strained due to his drug use  Childhood History:  By whom was/is the patient raised?: Both parents Description of patient's relationship with caregiver when they were a child: very loving, caring and supportive. Per patient, father kept the family life peaceful Patient's description of current relationship with people who raised him/her: patient's father died 5 years ago, mother resides in [place] and has very limited contact with her Does patient have siblings?: Yes Number of Siblings: 5  Description of patient's current relationship with siblings: not as close as they reportedly hold grudges against patient for his past behaviors Did patient suffer any verbal/emotional/physical/sexual abuse as a child?: No Did patient suffer from severe childhood neglect?: No Has patient ever been sexually abused/assaulted/raped as an adolescent or adult?: No Was the patient ever a victim of a crime or a disaster?: No Witnessed domestic violence?: No Has patient been effected by domestic violence as an adult?: No  Education:  Highest grade of school patient has completed: 12 Currently a student?: No Name of school: NA Contact person: NA Learning disability?: No  Employment/Work Situation:  Employment situation:works at temp agency-part time. 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 No insurance Does patient have a representative payee or guardian?: No  Alcohol/Substance Abuse:   What has been your use of drugs/alcohol within the last 12 months?:Crack cocaine daily sincehe relapsed last week.  Alcohol daily as well but pt thinks crack cocaine is the primary issue.  If attempted suicide, did drugs/alcohol play a role in this?:pt endorsed SI with a plan prior to admission. He states that he no longer feels SI or has a plan.  Alcohol/Substance Abuse Treatment Hx: Past Tx, Inpatient, Outpatient If yes, describe treatment: [place] program 7 yrs ago, [facility] residential program in [place] several years ago, [place] in 2014, [place] in Feb of 2017, [facility]. [facility] October 2018 [place].  Has alcohol/substance abuse ever caused legal problems?: Yes- marijuana and cocaine possession charges -charged for possession of drug paraphanelia last week.   Social Support System:  [employer] Support System:Poor Describe Community Support System:Wife, self Type of faith/religion: Darrick Meigs How does patient's faith help to cope with current illness?:Going to church is very helpful.  Leisure/Recreation:  Leisure and Hobbies: fishing  Strengths/Needs:  What things does the patient do well?: "I am a decent person" In what areas does patient struggle / problems for patient: drug addiction, self-esteem and guilt  Discharge Plan:  Does patient have access to transportation?:No (Needs help figuring this out) Will patient be returning to same living situation after discharge?:Yes Currently receiving community mental health services:Yes-[facility] for medication management.  If no, would patient like referral for services when discharged?: Yes (What county?) ([place], no insurance) Does patient have financial barriers related to dischargedications?: Yes Patient description of barriers related to discharge medications: No health insurance, no income  Summary/Recommendations: Summary and Recommendations (to be completed by the evaluator): Patient is a 58yo  male admitted to the hospital seeking treatment for suicidal ideations, crack cocaine relapse, and for medication stabilization. Patient was discharged from [facility] 10/18. He reports that he relapsed a few weeks after discharge and has had marital issues with his wife due to his relapse. Patient would like to return home and pursue SAIOP through [facility] and resume medication management with [facility]. Pt given residential options including [facility] but is not interested at this time. Recommendations for patient include: crisis stabilization, therapeutic milieu, encourage group attendance and participation, medication management for detox/mood stabilization, and development of comprehensive mental wellness/sobriety plan. CSW continuing to assess.         Summary/Recommendations:   Summary and Recommendations (to be completed by the evaluator): Patient is a 58yo male living in [place], [place] with his wife. He presents to the hospital due to [facility] with a plan following a recent relapse on alcohol and crack cocaine after 32months of sobriety. Pt reports a recent arrest last week and charge of having drug paraphanelia. Pt works part time at a [employer]. He is concerned about missing work. Pt reports that he sees [facility] the [facility] for [facility] and used to attend [facility] for group therapy. He is interested in starting therapy again. Pt has a diagnosis of MDD, cocaine use disorder, and alcohol use disorder. He currently denies SI/HI/AVH. Recommendations for pt include: crisis stabilization, therapeutic milieu, encourage group attendance and participation, medication management/detox if needed, and development of comprehensive mental wellness/sobriety plan. CSW assessing for appropriate referrals.  Avelina Laine, MSW, LCSW  02/11/2019 10:41 AM

## 2019-02-11 NOTE — Progress Notes (Signed)
Patient ID: James [name], male   DOB: 08-Oct-1960, 58 y.o.   MRN: [id number] Pt refused am lab stating he is discharging today

## 2019-02-11 NOTE — Progress Notes (Signed)
Pt discharged to lobby to wait on security who will take him to his car [facility]). Pt was stable and appreciative at that time. All papers, samples and (electronic) prescriptions were given and valuables returned. Verbal understanding expressed. Denies SI/HI and A/VH. Pt given opportunity to express concerns and ask questions.

## 2019-03-11 ENCOUNTER — Emergency Department (HOSPITAL_COMMUNITY)
Admission: EM | Admit: 2019-03-11 | Discharge: 2019-03-12 | Disposition: A | Discharge status: Home / Self Care | Payer: Self-pay | Attending: Emergency Medicine | Admitting: Emergency Medicine

## 2019-03-11 ENCOUNTER — Other Ambulatory Visit: Payer: Self-pay

## 2019-03-11 DIAGNOSIS — Z008 Encounter for other general examination: Secondary | ICD-10-CM | POA: Insufficient documentation

## 2019-03-11 DIAGNOSIS — Z20828 Contact with and (suspected) exposure to other viral communicable diseases: Secondary | ICD-10-CM | POA: Insufficient documentation

## 2019-03-11 DIAGNOSIS — Z79899 Other long term (current) drug therapy: Secondary | ICD-10-CM | POA: Insufficient documentation

## 2019-03-11 DIAGNOSIS — F17210 Nicotine dependence, cigarettes, uncomplicated: Secondary | ICD-10-CM | POA: Insufficient documentation

## 2019-03-11 DIAGNOSIS — R45851 Suicidal ideations: Secondary | ICD-10-CM | POA: Insufficient documentation

## 2019-03-11 DIAGNOSIS — I5022 Chronic systolic (congestive) heart failure: Secondary | ICD-10-CM | POA: Insufficient documentation

## 2019-03-11 DIAGNOSIS — Z7984 Long term (current) use of oral hypoglycemic drugs: Secondary | ICD-10-CM | POA: Insufficient documentation

## 2019-03-11 DIAGNOSIS — F1910 Other psychoactive substance abuse, uncomplicated: Secondary | ICD-10-CM

## 2019-03-11 DIAGNOSIS — F32A Depression, unspecified: Secondary | ICD-10-CM

## 2019-03-11 DIAGNOSIS — F329 Major depressive disorder, single episode, unspecified: Secondary | ICD-10-CM

## 2019-03-11 DIAGNOSIS — I110 Hypertensive heart disease with heart failure: Secondary | ICD-10-CM | POA: Insufficient documentation

## 2019-03-11 DIAGNOSIS — E119 Type 2 diabetes mellitus without complications: Secondary | ICD-10-CM | POA: Insufficient documentation

## 2019-03-11 DIAGNOSIS — F331 Major depressive disorder, recurrent, moderate: Secondary | ICD-10-CM | POA: Insufficient documentation

## 2019-03-11 DIAGNOSIS — F1420 Cocaine dependence, uncomplicated: Secondary | ICD-10-CM | POA: Insufficient documentation

## 2019-03-11 LAB — RAPID URINE DRUG SCREEN, HOSP PERFORMED
Amphetamines: NOT DETECTED
Barbiturates: NOT DETECTED
Benzodiazepines: NOT DETECTED
Cocaine: POSITIVE — AB
Opiates: NOT DETECTED
Tetrahydrocannabinol: NOT DETECTED

## 2019-03-11 LAB — COMPREHENSIVE METABOLIC PANEL
ALT: 16 U/L (ref 0–44)
AST: 16 U/L (ref 15–41)
Albumin: 3.7 g/dL (ref 3.5–5.0)
Alkaline Phosphatase: 69 U/L (ref 38–126)
Anion gap: 13 (ref 5–15)
BUN: 10 mg/dL (ref 6–20)
CO2: 27 mmol/L (ref 22–32)
Calcium: 9.6 mg/dL (ref 8.9–10.3)
Chloride: 95 mmol/L — ABNORMAL LOW (ref 98–111)
Creatinine, Ser: 1.02 mg/dL (ref 0.61–1.24)
GFR calc Af Amer: >60 mL/min (ref >60)
GFR calc non Af Amer: >60 mL/min (ref >60)
Glucose, Bld: 422 mg/dL — ABNORMAL HIGH (ref 70–99)
Potassium: 4.1 mmol/L (ref 3.5–5.1)
Sodium: 135 mmol/L (ref 135–145)
Total Bilirubin: 0.8 mg/dL (ref 0.3–1.2)
Total Protein: 6.6 g/dL (ref 6.5–8.1)

## 2019-03-11 LAB — CBG MONITORING, ED
Glucose-Capillary: 324 mg/dL — ABNORMAL HIGH (ref 70–99)
Glucose-Capillary: 335 mg/dL — ABNORMAL HIGH (ref 70–99)
Glucose-Capillary: 350 mg/dL — ABNORMAL HIGH (ref 70–99)
Glucose-Capillary: 330 mg/dL — ABNORMAL HIGH (ref 70–99)

## 2019-03-11 LAB — ACETAMINOPHEN LEVEL: Acetaminophen (Tylenol), Serum: <10 ug/mL — ABNORMAL LOW (ref 10–30)

## 2019-03-11 LAB — CBC
HCT: 41.4 % (ref 39.0–52.0)
Hemoglobin: 14.6 g/dL (ref 13.0–17.0)
MCH: 29.9 pg (ref 26.0–34.0)
MCHC: 35.3 g/dL (ref 30.0–36.0)
MCV: 84.7 fL (ref 80.0–100.0)
Platelets: 279 10*3/uL (ref 150–400)
RBC: 4.89 MIL/uL (ref 4.22–5.81)
RDW: 14 % (ref 11.5–15.5)
WBC: 3.3 10*3/uL — ABNORMAL LOW (ref 4.0–10.5)
nRBC: 0 % (ref 0.0–0.2)

## 2019-03-11 LAB — SALICYLATE LEVEL: Salicylate Lvl: <7 mg/dL (ref 2.8–30.0)

## 2019-03-11 LAB — ETHANOL: Alcohol, Ethyl (B): <10 mg/dL (ref <10)

## 2019-03-11 LAB — SARS CORONAVIRUS 2 (TAT 6-24 HRS): SARS Coronavirus 2: NEGATIVE

## 2019-03-11 MED ORDER — INSULIN ASPART 100 UNIT/ML ~~LOC~~ SOLN
4.0000 [IU] | Freq: Once | SUBCUTANEOUS | Status: AC
  Administered 2019-03-11: 4 [IU] via SUBCUTANEOUS

## 2019-03-11 MED ORDER — METFORMIN HCL 500 MG PO TABS
1000.0000 mg | ORAL_TABLET | Freq: Two times a day (BID) | ORAL | Status: DC
  Administered 2019-03-11 – 2019-03-12 (×2): 1000 mg via ORAL
  Filled 2019-03-11 (×2): qty 2

## 2019-03-11 MED ORDER — ATORVASTATIN CALCIUM 40 MG PO TABS
40.0000 mg | ORAL_TABLET | Freq: Every day | ORAL | Status: DC
  Administered 2019-03-11 – 2019-03-12 (×2): 40 mg via ORAL
  Filled 2019-03-11 (×2): qty 1

## 2019-03-11 MED ORDER — LISINOPRIL 10 MG PO TABS
10.0000 mg | ORAL_TABLET | Freq: Every day | ORAL | Status: DC
  Administered 2019-03-11: 10 mg via ORAL
  Filled 2019-03-11 (×2): qty 1

## 2019-03-11 MED ORDER — CARVEDILOL 12.5 MG PO TABS
25.0000 mg | ORAL_TABLET | Freq: Two times a day (BID) | ORAL | Status: DC
  Administered 2019-03-11 – 2019-03-12 (×2): 25 mg via ORAL
  Filled 2019-03-11 (×2): qty 2

## 2019-03-11 MED ORDER — BUPROPION HCL ER (XL) 150 MG PO TB24
150.0000 mg | ORAL_TABLET | Freq: Every day | ORAL | Status: DC
  Administered 2019-03-11 – 2019-03-12 (×2): 150 mg via ORAL
  Filled 2019-03-11 (×2): qty 1

## 2019-03-11 MED ORDER — INSULIN GLARGINE 100 UNIT/ML ~~LOC~~ SOLN
15.0000 [IU] | Freq: Every day | SUBCUTANEOUS | Status: DC
  Administered 2019-03-11: 15 [IU] via SUBCUTANEOUS
  Filled 2019-03-11 (×2): qty 0.15

## 2019-03-11 MED ORDER — ACETAMINOPHEN 325 MG PO TABS: 650.0000 mg | ORAL_TABLET | ORAL | Status: DC | PRN

## 2019-03-11 NOTE — BH Assessment (Signed)
Tele Assessment Note   Patient Name: James [name] MRN: [id number] Referring Physician: Dr. Melene Plan, DO Location of Patient: [facility] Location of Provider: Behavioral Health TTS Department  James [name] is a 58 y.o. male who voluntarily came to [facility] with suicidal ideations without a plan.  Pt states, "I'm depressed that I relapsed on crack and missed Thanksgiving with my family. I feel so bad that I want to kill myself but thinking about my granddaughters keep me from doing it."  Pt reports having one suicide attempt after his father died.  Pt states "I tried to run into traffic".  Pt reports having 5 years of sobriety prior to his father's death in Aug 04, 2011 and another 17 months of sobriety that recently ended 2018-08-04.   Pt reports being on a 5 week cocaine binge.  Pt states, "I been smoking crack nonstop for 5 weeks and drinking some wine", last used PTA.  Pt denies. HI/A/V-hallucinations.  Pt reside with his wife of 38 years.  Pt works at a [employer].  Pt reports he has not be working full days for the past 5 weeks or [name] home.  Pt reports having a history of inpatient/outpatient SA treatment at multiple facilities.  Pt denies a history of psychical, sexual, and verbal abuse.   Patient was wearing scrubs and appeared appropriately groomed.  Pt was alert throughout the assessment.  Patient made fair eye contact and had normal psychomotor activity.  Patient spoke in a normal voice without pressured speech.  Pt expressed feeling disappointed.  Pt's affect appeared dysphoric and congruent with stated mood. Pt's thought process was logical and coherent.  Pt presented with partial insight and judgement.  Pt did not appear to be responding to internal stimuli.  Pt was not able to reliably contract for safety.   Disposition: [facility] discussed case with BH Provider, Hillery Jacks, NP who recommends the pt is observed overnight for safety and stability and reevaluated  in the AM by psychiatry and peer support.     Diagnosis:      F14.20  Cocaine Use Disorder, Severe                         F33.1     Major Depressive Disorder, Moderate  Past Medical History:  Past Medical History:  Diagnosis Date  . Cellulitis and abscess of foot 09/29/2016  . Chronic systolic heart failure (HCC)   . Depression   . Diabetes mellitus without complication (HCC)   . Essential hypertension   . NICM (nonischemic cardiomyopathy) (HCC) EF 20-25%     Past Surgical History:  Procedure Laterality Date  . CARDIAC CATHETERIZATION N/A 04/30/2016   Procedure: Right/Left Heart Cath and Coronary Angiography;  Surgeon: Yvonne Kendall, MD;  [facility] INVASIVE CV LAB;  Service: Cardiovascular;  Laterality: N/A;  . FINGER SURGERY      Family History:  Family History  Problem Relation Age of Onset  . Diabetes Other   . Hypertension Other   . Diabetes Mother   . Brain cancer Father   . Mental illness Brother   . Drug abuse Brother   . Diabetes Brother     Social History:  reports that he has been smoking cigarettes. He has a 11.50 pack-year smoking history. He has never used smokeless tobacco. He reports current alcohol use. He reports current drug use. Drugs: Cocaine and "Crack" cocaine.  Additional Social History:  Alcohol / Drug Use  Pain Medications: See MARs Prescriptions: See MARs Over the Counter: See MARs History of alcohol / drug use?: Yes Longest period of sobriety (when/how long): 5 years of sobriety in 2013 Negative Consequences of Use: Personal relationships, Financial Substance #1 Name of Substance 1: Crack cocaine 1 - Age of First Use: 58 y/o 1 - Amount (size/oz): unknown 1 - Frequency: daily 1 - Duration: ongoing 1 - Last Use / Amount: PTA unknown Substance #2 Name of Substance 2: Alcohol 2 - Age of First Use: unknown 2 - Amount (size/oz): unknown 2 - Frequency: unknown 2 - Duration: ongoing 2 - Last Use / Amount: PTA  CIWA: CIWA-Ar BP: (!)  106/57 Pulse Rate: 60 COWS:    Allergies: No Known Allergies  Home Medications: (Not in a hospital admission)   OB/GYN Status:  No LMP for male patient.  General Assessment Data Assessment unable to be completed: Yes Reason for not completing assessment: [facility] attempted to assess pt.  Per pt's nurse, Sarah, RN "Pt is not alert enough to be assessed based off PA's failed attempt.  I will contact you when the pt becomes more alert."  TTS will complete pt's assessment at a later time. Location of Assessment: [facility] ED TTS Assessment: In system Is this a Tele or Face-to-Face Assessment?: Tele Assessment Is this an Initial Assessment or a Re-assessment for this encounter?: Initial Assessment Patient Accompanied by:: N/A Language Other than English: No Living Arrangements: Other (Comment)(live with wife) What gender do you identify as?: Male Marital status: Married(38 yrs) Living Arrangements: Spouse/significant other Can pt return to current living arrangement?: Yes Admission Status: Voluntary Is patient capable of signing voluntary admission?: Yes Referral Source: Self/Family/Friend     Crisis Care Plan Living Arrangements: Spouse/significant other Legal Guardian: Other:(Self) Name of Psychiatrist: No Name of Therapist: No  Education Status Is patient currently in school?: No Is the patient employed, 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 : Yes Suicidal Intent: Yes-Currently Present Has patient had any suicidal intent within the past 6 months prior to admission? : Yes Is patient at risk for suicide?: Yes Suicidal Plan?: Yes-Currently Present Has patient had any suicidal plan within the past 6 months prior to admission? : No Specify Current Suicidal Plan: Overdose Access to Means: Yes Specify  Access to Suicidal Means: drugs What has been your use of drugs/alcohol within the last 12 months?: Cocaine Previous Attempts/Gestures: Yes How many times?: 1 Other Self Harm Risks: tried to run in traffic Triggers for Past Attempts: Other (Comment)(Pt was high and disappointed at himself) Intentional Self Injurious Behavior: None Family Suicide History: No Recent stressful life event(s): Other (Comment)(relapse and disappointing family) Persecutory voices/beliefs?: No Depression: Yes Depression Symptoms: Insomnia, Tearfulness, Fatigue, Guilt Substance abuse history and/or treatment for substance abuse?: Yes Suicide prevention information given to non-admitted patients: Not applicable  Risk to Others within the past 6 months Homicidal Ideation: No Does patient have any lifetime risk of violence toward others beyond the six months prior to admission? : No Thoughts of Harm to Others: No Current Homicidal Intent: No Current Homicidal Plan: No Access to Homicidal Means: No History of harm to others?: No Assessment of Violence: None Noted Violent Behavior Description: No Does patient have access to weapons?: No Criminal Charges Pending?: No Does patient have a court date: No Is patient on probation?: Yes(owed money  and couldn't pay)  Psychosis Hallucinations: None noted Delusions: None noted  Mental Status Report Appearance/Hygiene: Unremarkable, In scrubs Eye Contact: Fair Motor Activity: Freedom of movement Speech: Logical/coherent Level of Consciousness: Quiet/awake Mood: Depressed, Guilty, Helpless Affect: Appropriate to circumstance, Depressed Anxiety Level: None Thought Processes: Coherent, Relevant Judgement: Partial Orientation: Person, Place, Time, Appropriate for developmental age Obsessive Compulsive Thoughts/Behaviors: None  Cognitive Functioning Concentration: Fair Memory: Recent Intact, Remote Intact Is patient IDD: No Insight: Poor Impulse Control:  Poor Appetite: Poor Have you had any weight changes? : Loss Amount of the weight change? (lbs): 20 lbs(5 weeks) Sleep: Decreased Total Hours of Sleep: 0 Vegetative Symptoms: None  ADLScreening [facility] Assessment Services) Patient's cognitive ability adequate to safely complete daily activities?: Yes Patient able to express need for assistance with ADLs?: Yes Independently performs ADLs?: Yes (appropriate for developmental age)  Prior Inpatient Therapy Prior Inpatient Therapy: Yes Prior Therapy Dates: multiple Prior Therapy Facilty/Provider(s): multiple Reason for Treatment: SA  Prior Outpatient Therapy Prior Outpatient Therapy: Yes Prior Therapy Dates: multiple Prior Therapy Facilty/Provider(s): multiple Reason for Treatment: SA Does patient have an ACCT team?: No Does patient have Intensive In-House Services?  : No Does patient have [facility] services? : No Does patient have P4CC services?: No  ADL Screening (condition at time of admission) Patient's cognitive ability adequate to safely complete daily activities?: Yes Is the patient deaf or have difficulty hearing?: No Does the patient have difficulty seeing, even when wearing glasses/contacts?: No Does the patient have difficulty concentrating, remembering, or making decisions?: No Patient able to express need for assistance with ADLs?: Yes Does the patient have difficulty dressing or bathing?: No Independently performs ADLs?: Yes (appropriate for developmental age) Does the patient have difficulty walking or climbing stairs?: No Weakness of Legs: None Weakness of Arms/Hands: None  Home Assistive Devices/Equipment Home Assistive Devices/Equipment: None    Abuse/Neglect Assessment (Assessment to be complete while patient is alone) Abuse/Neglect Assessment Can Be Completed: Yes Physical Abuse: Denies Verbal Abuse: Denies Sexual Abuse: Denies Exploitation of patient/patient's resources: Denies Self-Neglect: Denies      Merchant navy officer (For Healthcare) Does Patient Have a Medical Advance Directive?: No Would patient like information on creating a medical advance directive?: No - Patient declined Nutrition Screen- [facility] Adult/WL/AP Patient's home diet: NPO        Disposition: [facility] discussed case with BH Provider, Hillery Jacks, NP who recommends the pt is observed overnight for safety and stability and reevaluated in the AM by psychiatry and peer support.    Disposition Initial Assessment Completed for this Encounter: Yes Disposition of Patient: (Observe for safety Per Hillery Jacks, NP) Patient refused recommended treatment: No  This service was provided via telemedicine using a 2-way, interactive audio and video technology.  Names of all persons participating in this telemedicine service and their role in this encounter. Name: James [name] Role: Patient  Name: Tyron Russell, MS, [facility], NCC Role: Triage Specialist  Name: Hillery Jacks, NP Role: [facility] Provider  Name:  Role:     Tyron Russell, MS, [facility], NCC 03/11/2019 11:55 AM

## 2019-03-11 NOTE — Progress Notes (Signed)
   03/11/19 1022  General Assessment Data  Reason for not completing assessment [place] attempted to assess pt.  Per pt's nurse, Sarah, RN "Pt is not alert enough to be assessed based off PA's failed attempt.  I will contact you when the pt becomes more alert."  TTS will complete assessment at a later time.   Vandy Fong L. [place], [place], [facility], [facility] Therapeutic Triage Specialist  [phone]

## 2019-03-11 NOTE — ED Notes (Signed)
Pt ambulated to bathroom - no specimen obtained - aware need specimen - pt returned to room and is eating lunch.

## 2019-03-11 NOTE — ED Notes (Signed)
Pt's CBG result was 335. Informed Becky - RN.

## 2019-03-11 NOTE — BHH Counselor (Signed)
  [place] ASSESSMENT DISPOSITION  [facility] discussed case with [place] Provider, Ricky Ala, NP who recommends the pt is observed overnight for safety and stability and reevaluated in the AM by psychiatry and peer support.    Edla Para L. [place], [place], [facility], [facility] Therapeutic Triage Specialist  [phone]

## 2019-03-11 NOTE — ED Provider Notes (Signed)
[facility] EMERGENCY DEPARTMENT Provider Note   CSN: [id number] Arrival date & time: 03/11/19  0732     History   Chief Complaint Chief Complaint  Patient presents with  . Suicidal  . Depression  . Addiction Problem    HPI James [name] is a 58 y.o. male.     HPI Patient presents to the emergency department with suicidal ideations.  The patient states that he is attempted suicide in the past.  The patient states he has no specific plan.  Patient states that nothing seems make his condition better or worse.  Patient states that he has not been taking his medications consistently.  The patient denies chest pain, shortness of breath, headache,blurred vision, neck pain, fever, cough, weakness, numbness, dizziness, anorexia, edema, abdominal pain, nausea, vomiting, diarrhea, rash, back pain, dysuria, hematemesis, bloody stool, near syncope, or syncope. Past Medical History:  Diagnosis Date  . Cellulitis and abscess of foot 09/29/2016  . Chronic systolic heart failure (HCC)   . Depression   . Diabetes mellitus without complication (HCC)   . Essential hypertension   . NICM (nonischemic cardiomyopathy) (HCC) EF 20-25%     Patient Active Problem List   Diagnosis Date Noted  . Cocaine dependence with cocaine-induced mood disorder (HCC)   . MDD (major depressive disorder), recurrent episode, severe (HCC) 02/10/2019  . Polysubstance abuse (HCC) 07/12/2017  . Suicidal ideations 07/12/2017  . Cocaine abuse (HCC)   . MDD (major depressive disorder) 01/14/2017  . Cellulitis of left foot 09/28/2016  . Cellulitis 09/28/2016  . Cardiomyopathy- suspect NICM but etiology not yet determined 04/27/2016  . Acute combined systolic and diastolic heart failure (HCC) 04/27/2016  . History of major depression 04/27/2016  . CAP (community acquired pneumonia) 04/26/2016  . Acute respiratory failure with hypoxia (HCC) 04/26/2016  . Controlled type 2 diabetes mellitus with  hyperglycemia (HCC) 04/26/2016  . Uncontrolled type 2 diabetes mellitus with hyperglycemia (HCC) 04/26/2016  . Alcohol use disorder, mild, abuse 12/12/2015  . MDD (major depressive disorder), recurrent severe, without psychosis (HCC) 06/26/2015  . History of cocaine abuse (HCC) 06/25/2015  . SPONDYLOSIS, CERVICAL, WITH RADICULOPATHY 02/25/2009  . MUSCLE SPASM, BACK 02/12/2009  . LEUKOPENIA, MILD 08/19/2008  . ECZEMA, HANDS 08/06/2008  . HYPERCHOLESTEROLEMIA 05/09/2007  . SINUSITIS, CHRONIC 05/01/2007  . ALLERGIC RHINITIS 12/21/2006  . Essential hypertension 12/07/2006    Past Surgical History:  Procedure Laterality Date  . CARDIAC CATHETERIZATION N/A 04/30/2016   Procedure: Right/Left Heart Cath and Coronary Angiography;  Surgeon: Yvonne Kendall, MD;  Location: [facility] INVASIVE CV LAB;  Service: Cardiovascular;  Laterality: N/A;  . FINGER SURGERY          Home Medications    Prior to Admission medications   Medication Sig Start Date End Date Taking? Authorizing Provider  atorvastatin (LIPITOR) 40 MG tablet Take 1 tablet (40 mg total) by mouth daily. For high cholesterol 02/11/19  Yes Aldean Baker, NP  buPROPion (WELLBUTRIN XL) 150 MG 24 hr tablet Take 1 tablet (150 mg total) by mouth daily. 02/12/19  Yes Aldean Baker, NP  carvedilol (COREG) 25 MG tablet Take 1 tablet (25 mg total) by mouth 2 (two) times daily with a meal. 02/11/19  Yes Aldean Baker, NP  insulin glargine (LANTUS) 100 UNIT/ML injection Inject 0.15 mLs (15 Units total) into the skin at bedtime. For diabetes management 02/11/19  Yes Aldean Baker, NP  lisinopril (ZESTRIL) 10 MG tablet Take 1 tablet (10 mg total) by mouth  daily. 02/11/19  Yes Aldean Baker, NP  metFORMIN (GLUCOPHAGE) 1000 MG tablet Take 1 tablet (1,000 mg total) by mouth 2 (two) times daily with a meal. For diabetes management 02/11/19  Yes Aldean Baker, NP    Family History Family History  Problem Relation Age of Onset  . Diabetes Other   .  Hypertension Other   . Diabetes Mother   . Brain cancer Father   . Mental illness Brother   . Drug abuse Brother   . Diabetes Brother     Social History Social History   Tobacco Use  . Smoking status: Current Every Day Smoker    Packs/day: 0.50    Years: 23.00    Pack years: 11.50    Types: Cigarettes  . Smokeless tobacco: Never Used  . Tobacco comment: Every other day   Substance Use Topics  . Alcohol use: Yes  . Drug use: Yes    Types: Cocaine, "Crack" cocaine     Allergies   Patient has no known allergies.   Review of Systems Review of Systems All other systems negative except as documented in the HPI. All pertinent positives and negatives as reviewed in the HPI.  Physical Exam Updated Vital Signs BP 139/77 (BP Location: Left Arm)   Pulse (!) 101   Temp 97.7 F (36.5 C) (Temporal)   Resp 15   SpO2 99%   Physical Exam Vitals signs and nursing note reviewed.  Constitutional:      General: He is not in acute distress.    Appearance: He is well-developed.  HENT:     Head: Normocephalic and atraumatic.  Eyes:     Pupils: Pupils are equal, round, and reactive to light.  Neck:     Musculoskeletal: Normal range of motion and neck supple.  Cardiovascular:     Rate and Rhythm: Normal rate and regular rhythm.     Heart sounds: Normal heart sounds. No murmur. No friction rub. No gallop.   Pulmonary:     Effort: Pulmonary effort is normal. No respiratory distress.     Breath sounds: Normal breath sounds. No wheezing.  Abdominal:     General: Bowel sounds are normal. There is no distension.     Palpations: Abdomen is soft.     Tenderness: There is no abdominal tenderness.  Skin:    General: Skin is warm and dry.     Capillary Refill: Capillary refill takes less than 2 seconds.     Findings: No erythema or rash.  Neurological:     Mental Status: He is alert and oriented to person, place, and time.     Motor: No abnormal muscle tone.     Coordination:  Coordination normal.  Psychiatric:        Behavior: Behavior normal.        Thought Content: Thought content includes suicidal ideation.      ED Treatments / Results  Labs (all labs ordered are listed, but only abnormal results are displayed) Labs Reviewed  COMPREHENSIVE METABOLIC PANEL - Abnormal; Notable for the following components:      Result Value   Chloride 95 (*)    Glucose, Bld 422 (*)    All other components within normal limits  ACETAMINOPHEN LEVEL - Abnormal; Notable for the following components:   Acetaminophen (Tylenol), Serum <10 (*)    All other components within normal limits  CBC - Abnormal; Notable for the following components:   WBC 3.3 (*)    All other  components within normal limits  CBG MONITORING, ED - Abnormal; Notable for the following components:   Glucose-Capillary 324 (*)    All other components within normal limits  CBG MONITORING, ED - Abnormal; Notable for the following components:   Glucose-Capillary 335 (*)    All other components within normal limits  SARS CORONAVIRUS 2 (TAT 6-24 HRS)  ETHANOL  SALICYLATE LEVEL  RAPID URINE DRUG SCREEN, HOSP PERFORMED    EKG None  Radiology No results found.  Procedures Procedures (including critical care time)  Medications Ordered in ED Medications  acetaminophen (TYLENOL) tablet 650 mg (has no administration in time range)  atorvastatin (LIPITOR) tablet 40 mg (40 mg Oral Given 03/11/19 1232)  buPROPion (WELLBUTRIN XL) 24 hr tablet 150 mg (150 mg Oral Given 03/11/19 1232)  carvedilol (COREG) tablet 25 mg (has no administration in time range)  insulin glargine (LANTUS) injection 15 Units (has no administration in time range)  lisinopril (ZESTRIL) tablet 10 mg (10 mg Oral Given 03/11/19 1232)  metFORMIN (GLUCOPHAGE) tablet 1,000 mg (has no administration in time range)  insulin aspart (novoLOG) injection 4 Units (4 Units Subcutaneous Given 03/11/19 1423)     Initial Impression / Assessment and  Plan / ED Course  I have reviewed the triage vital signs and the nursing notes.  Pertinent labs & imaging results that were available during my care of the patient were reviewed by me and considered in my medical decision making (see chart for details).        Patient will need TTS or suicidal ideations.  Patient has been stable here in the emergency department we did give him treatment for his blood sugar and he is not in DKA. Final Clinical Impressions(s) / ED Diagnoses   Final diagnoses:  None    ED Discharge Orders    None       Dalia Heading, PA-C 03/11/19 [place], [place], DO 03/11/19 1517

## 2019-03-11 NOTE — ED Notes (Signed)
Patient making phone call at [facility]

## 2019-03-11 NOTE — ED Notes (Signed)
Pt's lunch ordered 

## 2019-03-11 NOTE — ED Notes (Signed)
TTS machine at pt's bedside. 

## 2019-03-11 NOTE — ED Notes (Signed)
Message sent to Dr Eulis Foster re: pt's CBG's. Pt reportedly has been using cocaine x past few days and not taking meds.

## 2019-03-11 NOTE — ED Notes (Signed)
TTS being performed.  

## 2019-03-11 NOTE — ED Notes (Signed)
Pt given burgundy colored shirt. Pt's shirt, undershirt, and gray colored slides were bagged up and given and placed at nurse's station.

## 2019-03-11 NOTE — ED Notes (Signed)
Sent a message to [employer], PA, advising of CBG 324.

## 2019-03-11 NOTE — ED Triage Notes (Signed)
Pt here for SI and cocaine/ ETOH abuse. States he last used and drank this morning. Plans to cut himself.

## 2019-03-11 NOTE — ED Notes (Signed)
Woke pt so may take meds. Pt aware dinner tray on bedside table. Pt returned to sleeping.

## 2019-03-11 NOTE — ED Notes (Signed)
Pt's lunch arrived 

## 2019-03-11 NOTE — ED Notes (Signed)
Messaged C Lawyer, PA, re: insulin for pt d/t CBG 324. Per [facility], recommendation is to observe pt overnight and reassess in AM. Pt may possibly be accepted to [facility] Obs. Pt noted to be irritable - states d/t he has not slept in several days. Pt requesting to go to [facility]. Advised pt will keep him updated re: tx plan. Lunch tray delivered to pt.

## 2019-03-12 LAB — CBG MONITORING, ED: Glucose-Capillary: 305 mg/dL — ABNORMAL HIGH (ref 70–99)

## 2019-03-12 NOTE — Discharge Instructions (Addendum)
Follow-up with the resources provided.  Return here as needed. 

## 2019-03-12 NOTE — ED Triage Notes (Signed)
Pt provided with out Pt. Referrals.

## 2019-03-12 NOTE — Progress Notes (Signed)
Patient ID: James [name], male   DOB: 23-Aug-1960, 58 y.o.   MRN: [id number]   Reassessment    Per HPI: James [name] is a 58 y.o. male who voluntarily came to [facility] with suicidal ideations without a plan.  Pt states, "I'm depressed that I relapsed on crack and missed Thanksgiving with my family. I feel so bad that I want to kill myself but thinking about my granddaughters keep me from doing it."  Pt reports having one suicide attempt after his father died.  Pt states "I tried to run into traffic".  Pt reports having 5 years of sobriety prior to his father's death in Aug 23, 2011 and another 17 months of sobriety that recently ended 23-Aug-2018.   Pt reports being on a 5 week cocaine binge.  Pt states, "I been smoking crack nonstop for 5 weeks and drinking some wine", last used PTA.  Pt denies. HI/A/V-hallucinations.    Psychiatric reassessment  Patient psychiatrically reassessed by this provider. James [name] is a 58 year old male who presented to [facility] ED for reasons as noted above. He  Is  known to the ED and [facility]. He was discharged from [place] 02/11/2019. He has a chronic history of cocaine abuse which seems to bring about both  depression and suicidal thoughts. During his hospital course in October-November, 2020, patient reported that he  elapsed on cocaine which led to suicidal ideations. His UDS is positive for cocaine, His storyis very similar to his previous reason for his last admission to [facility].   During this evaluation, he is alert and oriented x4, calm and cooperative. He reports he is in the ED because he continues to struggle with crack cocaine use. Again, his story os similar to his last admission to [facility] so there are no acute/new concerns. When asked if he was suicidal he replied hesitatingly, " sone what: although he could not identify plan nor intent. In regard to safety, he stated," I just dont feel safe going back int he streets because I am afraid that I may use crack again."  He denies HI or  psychosis. Following his discharge form [facility], he had follow-up resources with [facility] and [facility] although he reports he did not follow-through with resources provided. He reports his is intrested in outpatient or inpatient substance abuse treatment facilities.   Based on my evaluation, patient does not meet criteria  for inpatient psychiatric admission. Patient is psychiatrically cleared at this time. Patient advised to follow-up with previous resources provided and noted above. I will request for  peer support.to speak to patient prior to his discharge. EDP Dr/ Alvino Chapel updated on current disposition.

## 2019-03-12 NOTE — ED Notes (Signed)
Patient verbalizes understanding of discharge instructions . Opportunity for questions and answers were provided . Armband removed by staff ,Pt discharged from ED. W/C  offered at D/C  and Declined W/C at D/C and was escorted to lobby by RN.  

## 2019-03-12 NOTE — ED Notes (Signed)
Diet was ordered for Lunch. 

## 2019-04-15 ENCOUNTER — Other Ambulatory Visit: Payer: Self-pay

## 2019-04-15 ENCOUNTER — Emergency Department (HOSPITAL_COMMUNITY)
Admission: EM | Admit: 2019-04-15 | Discharge: 2019-04-15 | Disposition: A | Discharge status: Home / Self Care | Payer: Self-pay | Attending: Emergency Medicine | Admitting: Emergency Medicine

## 2019-04-15 ENCOUNTER — Emergency Department (HOSPITAL_COMMUNITY): Payer: Self-pay

## 2019-04-15 ENCOUNTER — Encounter (HOSPITAL_COMMUNITY): Payer: Self-pay

## 2019-04-15 DIAGNOSIS — I110 Hypertensive heart disease with heart failure: Secondary | ICD-10-CM | POA: Insufficient documentation

## 2019-04-15 DIAGNOSIS — F1010 Alcohol abuse, uncomplicated: Secondary | ICD-10-CM | POA: Insufficient documentation

## 2019-04-15 DIAGNOSIS — Z79899 Other long term (current) drug therapy: Secondary | ICD-10-CM | POA: Insufficient documentation

## 2019-04-15 DIAGNOSIS — F1910 Other psychoactive substance abuse, uncomplicated: Secondary | ICD-10-CM

## 2019-04-15 DIAGNOSIS — Z794 Long term (current) use of insulin: Secondary | ICD-10-CM | POA: Insufficient documentation

## 2019-04-15 DIAGNOSIS — F17210 Nicotine dependence, cigarettes, uncomplicated: Secondary | ICD-10-CM | POA: Insufficient documentation

## 2019-04-15 DIAGNOSIS — F1410 Cocaine abuse, uncomplicated: Secondary | ICD-10-CM | POA: Insufficient documentation

## 2019-04-15 DIAGNOSIS — E119 Type 2 diabetes mellitus without complications: Secondary | ICD-10-CM | POA: Insufficient documentation

## 2019-04-15 DIAGNOSIS — I5022 Chronic systolic (congestive) heart failure: Secondary | ICD-10-CM | POA: Insufficient documentation

## 2019-04-15 LAB — BASIC METABOLIC PANEL
Anion gap: 14 (ref 5–15)
BUN: 15 mg/dL (ref 6–20)
CO2: 30 mmol/L (ref 22–32)
Calcium: 9.6 mg/dL (ref 8.9–10.3)
Chloride: 92 mmol/L — ABNORMAL LOW (ref 98–111)
Creatinine, Ser: 1.26 mg/dL — ABNORMAL HIGH (ref 0.61–1.24)
GFR calc Af Amer: >60 mL/min (ref >60)
GFR calc non Af Amer: >60 mL/min (ref >60)
Glucose, Bld: 391 mg/dL — ABNORMAL HIGH (ref 70–99)
Potassium: 4.3 mmol/L (ref 3.5–5.1)
Sodium: 136 mmol/L (ref 135–145)

## 2019-04-15 LAB — TROPONIN I (HIGH SENSITIVITY)
Troponin I (High Sensitivity): 14 ng/L (ref <18)
Troponin I (High Sensitivity): 13 ng/L (ref <18)

## 2019-04-15 LAB — HEPATIC FUNCTION PANEL
ALT: 16 U/L (ref 0–44)
AST: 26 U/L (ref 15–41)
Albumin: 3.8 g/dL (ref 3.5–5.0)
Alkaline Phosphatase: 68 U/L (ref 38–126)
Bilirubin, Direct: 0.3 mg/dL — ABNORMAL HIGH (ref 0.0–0.2)
Indirect Bilirubin: 1.3 mg/dL — ABNORMAL HIGH (ref 0.3–0.9)
Total Bilirubin: 1.6 mg/dL — ABNORMAL HIGH (ref 0.3–1.2)
Total Protein: 7 g/dL (ref 6.5–8.1)

## 2019-04-15 LAB — CBC
HCT: 43.1 % (ref 39.0–52.0)
Hemoglobin: 14.7 g/dL (ref 13.0–17.0)
MCH: 29.6 pg (ref 26.0–34.0)
MCHC: 34.1 g/dL (ref 30.0–36.0)
MCV: 86.7 fL (ref 80.0–100.0)
Platelets: 256 10*3/uL (ref 150–400)
RBC: 4.97 MIL/uL (ref 4.22–5.81)
RDW: 14.3 % (ref 11.5–15.5)
WBC: 6.2 10*3/uL (ref 4.0–10.5)
nRBC: 0 % (ref 0.0–0.2)

## 2019-04-15 LAB — ETHANOL: Alcohol, Ethyl (B): <10 mg/dL (ref <10)

## 2019-04-15 LAB — CBG MONITORING, ED
Glucose-Capillary: 293 mg/dL — ABNORMAL HIGH (ref 70–99)
Glucose-Capillary: 280 mg/dL — ABNORMAL HIGH (ref 70–99)

## 2019-04-15 LAB — LIPASE, BLOOD: Lipase: 26 U/L (ref 11–51)

## 2019-04-15 MED ORDER — LORAZEPAM 1 MG PO TABS
0.0000 mg | ORAL_TABLET | Freq: Four times a day (QID) | ORAL | Status: DC
  Administered 2019-04-15: 1 mg via ORAL
  Filled 2019-04-15: qty 1

## 2019-04-15 MED ORDER — SODIUM CHLORIDE 0.9 % IV BOLUS
1000.0000 mL | Freq: Once | INTRAVENOUS | Status: AC
  Administered 2019-04-15: 1000 mL via INTRAVENOUS

## 2019-04-15 MED ORDER — LORAZEPAM 2 MG/ML IJ SOLN: 0.0000 mg | Freq: Four times a day (QID) | INTRAMUSCULAR | Status: DC

## 2019-04-15 MED ORDER — THIAMINE HCL 100 MG PO TABS
100.0000 mg | ORAL_TABLET | Freq: Every day | ORAL | Status: DC
  Administered 2019-04-15: 100 mg via ORAL
  Filled 2019-04-15: qty 1

## 2019-04-15 MED ORDER — SODIUM CHLORIDE 0.9% FLUSH
3.0000 mL | Freq: Once | INTRAVENOUS | Status: AC
  Administered 2019-04-15: 09:00:00 3 mL via INTRAVENOUS

## 2019-04-15 MED ORDER — LORAZEPAM 2 MG/ML IJ SOLN: 0.0000 mg | Freq: Two times a day (BID) | INTRAMUSCULAR | Status: DC

## 2019-04-15 MED ORDER — LORAZEPAM 1 MG PO TABS: 1.0000 mg | ORAL_TABLET | Freq: Once | ORAL | Status: DC

## 2019-04-15 MED ORDER — THIAMINE HCL 100 MG/ML IJ SOLN: 100.0000 mg | Freq: Every day | INTRAMUSCULAR | Status: DC

## 2019-04-15 MED ORDER — LORAZEPAM 1 MG PO TABS: 0.0000 mg | ORAL_TABLET | Freq: Two times a day (BID) | ORAL | Status: DC

## 2019-04-15 NOTE — ED Provider Notes (Signed)
[name] EMERGENCY DEPARTMENT Provider Note   CSN: [id number] Arrival date & time: 04/15/19  1443     History Chief Complaint  Patient presents with  . Chest Pain    James [name] is a 59 y.o. male.  59 year old male with past medical history of diabetes, hypertension, nonischemic cardiomyopathy, cocaine abuse and alcohol use disorder presents with complaint of chest pain.  Patient reports developing chest pain yesterday evening described as midsternal, sharp, lasted a few hours and completely resolved, and had an episode of vomiting with report of clots of blood in his emesis.  Patient has not had any further vomiting, denies blood in his stools, denies pain in his chest at this time, no abdominal pain.  Patient reports last drink a pint of alcohol last night, last used crack cocaine 2 days ago, regularly drinks 1 to 2 pints daily, no history of withdrawal seizures.  Patient is requesting assistance with his drug or alcohol use.  Patient denies suicidal or homicidal ideation, no auditory or visual hallucinations.  No other complaints or concerns.        Past Medical History:  Diagnosis Date  . Cellulitis and abscess of foot 09/29/2016  . Chronic systolic heart failure (HCC)   . Depression   . Diabetes mellitus without complication (HCC)   . Essential hypertension   . NICM (nonischemic cardiomyopathy) (HCC) EF 20-25%     Patient Active Problem List   Diagnosis Date Noted  . Cocaine dependence with cocaine-induced mood disorder (HCC)   . MDD (major depressive disorder), recurrent episode, severe (HCC) 02/10/2019  . Polysubstance abuse (HCC) 07/12/2017  . Suicidal ideations 07/12/2017  . Cocaine abuse (HCC)   . MDD (major depressive disorder) 01/14/2017  . Cellulitis of left foot 09/28/2016  . Cellulitis 09/28/2016  . Cardiomyopathy- suspect NICM but etiology not yet determined 04/27/2016  . Acute combined systolic and diastolic heart failure (HCC)  15/40/0867  . History of major depression 04/27/2016  . CAP (community acquired pneumonia) 04/26/2016  . Acute respiratory failure with hypoxia (HCC) 04/26/2016  . Controlled type 2 diabetes mellitus with hyperglycemia (HCC) 04/26/2016  . Uncontrolled type 2 diabetes mellitus with hyperglycemia (HCC) 04/26/2016  . Alcohol use disorder, mild, abuse 12/12/2015  . MDD (major depressive disorder), recurrent severe, without psychosis (HCC) 06/26/2015  . History of cocaine abuse (HCC) 06/25/2015  . SPONDYLOSIS, CERVICAL, WITH RADICULOPATHY 02/25/2009  . MUSCLE SPASM, BACK 02/12/2009  . LEUKOPENIA, MILD 08/19/2008  . ECZEMA, HANDS 08/06/2008  . HYPERCHOLESTEROLEMIA 05/09/2007  . SINUSITIS, CHRONIC 05/01/2007  . ALLERGIC RHINITIS 12/21/2006  . Essential hypertension 12/07/2006    Past Surgical History:  Procedure Laterality Date  . CARDIAC CATHETERIZATION N/A 04/30/2016   Procedure: Right/Left Heart Cath and Coronary Angiography;  Surgeon: Yvonne Kendall, MD;  Location: [facility] INVASIVE CV LAB;  Service: Cardiovascular;  Laterality: N/A;  . FINGER SURGERY         Family History  Problem Relation Age of Onset  . Diabetes Other   . Hypertension Other   . Diabetes Mother   . Brain cancer Father   . Mental illness Brother   . Drug abuse Brother   . Diabetes Brother     Social History   Tobacco Use  . Smoking status: Current Every Day Smoker    Packs/day: 0.50    Years: 23.00    Pack years: 11.50    Types: Cigarettes  . Smokeless tobacco: Never Used  . Tobacco comment: Every other day  Substance Use Topics  . Alcohol use: Yes  . Drug use: Yes    Types: Cocaine, "Crack" cocaine    Home Medications Prior to Admission medications   Medication Sig Start Date End Date Taking? Authorizing Provider  atorvastatin (LIPITOR) 40 MG tablet Take 1 tablet (40 mg total) by mouth daily. For high cholesterol 02/11/19   Aldean Baker, NP  buPROPion (WELLBUTRIN XL) 150 MG 24 hr tablet Take 1  tablet (150 mg total) by mouth daily. 02/12/19   Aldean Baker, NP  carvedilol (COREG) 25 MG tablet Take 1 tablet (25 mg total) by mouth 2 (two) times daily with a meal. 02/11/19   Aldean Baker, NP  insulin glargine (LANTUS) 100 UNIT/ML injection Inject 0.15 mLs (15 Units total) into the skin at bedtime. For diabetes management 02/11/19   Aldean Baker, NP  lisinopril (ZESTRIL) 10 MG tablet Take 1 tablet (10 mg total) by mouth daily. 02/11/19   Aldean Baker, NP  metFORMIN (GLUCOPHAGE) 1000 MG tablet Take 1 tablet (1,000 mg total) by mouth 2 (two) times daily with a meal. For diabetes management 02/11/19   Aldean Baker, NP    Allergies    Patient has no known allergies.  Review of Systems   Review of Systems  Constitutional: Negative for chills and fever.  Respiratory: Negative for shortness of breath.   Cardiovascular: Positive for chest pain.  Gastrointestinal: Positive for nausea and vomiting. Negative for abdominal pain, blood in stool, constipation and diarrhea.  Skin: Negative for rash and wound.  Allergic/Immunologic: Positive for immunocompromised state.  All other systems reviewed and are negative.   Physical Exam Updated Vital Signs BP (!) 145/84   Pulse (!) 57   Temp 98.3 F (36.8 C) (Oral)   Resp (!) 22   Ht 6' (1.829 m)   Wt 59 kg   SpO2 100%   BMI 17.63 kg/m   Physical Exam Vitals and nursing note reviewed.  Constitutional:      General: He is not in acute distress.    Appearance: He is well-developed. He is not diaphoretic.     Comments: thin  HENT:     Head: Normocephalic and atraumatic.  Cardiovascular:     Rate and Rhythm: Regular rhythm. Tachycardia present.     Heart sounds: Normal heart sounds. No murmur.  Pulmonary:     Effort: Pulmonary effort is normal.     Breath sounds: Normal breath sounds.  Chest:     Chest wall: No tenderness.  Abdominal:     Palpations: Abdomen is soft.     Tenderness: There is no abdominal tenderness.    Musculoskeletal:     Right lower leg: No tenderness. No edema.     Left lower leg: No tenderness. No edema.  Skin:    General: Skin is warm and dry.     Findings: No rash.  Neurological:     Mental Status: He is alert and oriented to person, place, and time.  Psychiatric:        Mood and Affect: Mood is depressed.        Speech: Speech normal.        Behavior: Behavior normal.        Thought Content: Thought content does not include homicidal or suicidal ideation.     ED Results / Procedures / Treatments   Labs (all labs ordered are listed, but only abnormal results are displayed) Labs Reviewed  BASIC METABOLIC PANEL - Abnormal; Notable  for the following components:      Result Value   Chloride 92 (*)    Glucose, Bld 391 (*)    Creatinine, Ser 1.26 (*)    All other components within normal limits  HEPATIC FUNCTION PANEL - Abnormal; Notable for the following components:   Total Bilirubin 1.6 (*)    Bilirubin, Direct 0.3 (*)    Indirect Bilirubin 1.3 (*)    All other components within normal limits  CBG MONITORING, ED - Abnormal; Notable for the following components:   Glucose-Capillary 293 (*)    All other components within normal limits  CBG MONITORING, ED - Abnormal; Notable for the following components:   Glucose-Capillary 280 (*)    All other components within normal limits  CBC  ETHANOL  LIPASE, BLOOD  RAPID URINE DRUG SCREEN, HOSP PERFORMED  TROPONIN I (HIGH SENSITIVITY)  TROPONIN I (HIGH SENSITIVITY)    EKG EKG Interpretation  Date/Time:  Sunday April 15 2019 02:25:13 EST Ventricular Rate:  114 PR Interval:  138 QRS Duration: 88 QT Interval:  374 QTC Calculation: 515 R Axis:   92 Text Interpretation: Sinus tachycardia Right atrial enlargement Rightward axis T wave abnormality, consider inferolateral ischemia Abnormal ECG Confirmed by Lennice Sites [phone]) on 04/15/2019 7:40:15 AM   Radiology DG Chest 2 View  Result Date: 04/15/2019 CLINICAL DATA:   Chest pain EXAM: CHEST - 2 VIEW COMPARISON:  07/12/2017 FINDINGS: The heart size and mediastinal contours are within normal limits. Both lungs are clear. The visualized skeletal structures are unremarkable. IMPRESSION: No active cardiopulmonary disease. Electronically Signed   By: Donavan Foil M.D.   On: 04/15/2019 02:56    Procedures Procedures (including critical care time)  Medications Ordered in ED Medications  LORazepam (ATIVAN) injection 0-4 mg ( Intravenous See Alternative 04/15/19 0931)    Or  LORazepam (ATIVAN) tablet 0-4 mg (1 mg Oral Given 04/15/19 0931)  LORazepam (ATIVAN) injection 0-4 mg (has no administration in time range)    Or  LORazepam (ATIVAN) tablet 0-4 mg (has no administration in time range)  thiamine tablet 100 mg (100 mg Oral Given 04/15/19 1101)    Or  thiamine (B-1) injection 100 mg ( Intravenous See Alternative 04/15/19 1101)  sodium chloride flush (NS) 0.9 % injection 3 mL (3 mLs Intravenous Given 04/15/19 0923)  sodium chloride 0.9 % bolus 1,000 mL (0 mLs Intravenous Stopped 04/15/19 1314)  sodium chloride 0.9 % bolus 1,000 mL (0 mLs Intravenous Stopped 04/15/19 1327)    ED Course  I have reviewed the triage vital signs and the nursing notes.  Pertinent labs & imaging results that were available during my care of the patient were reviewed by me and considered in my medical decision making (see chart for details).  Clinical Course as of Apr 14 1342  Sun Apr 14, 2309  [id number] 59 year old male presented to emergency room with complaint of pain in his chest and an episode of vomiting clots of blood.  Patient had prolonged wait in the emergency room today, reported at time evaluation that his symptoms had completely resolved and he would like placement in a rehab facility for crack cocaine and alcohol.  Patient denies suicidal or homicidal ideation, auditory visual hallucination. At time of exam, patient was tachycardic.  Review of labs drawn on his arrival showed he was  hyperglycemic with a blood sugar of 391 and mild increase in his creatinine to 1.26.  Patient's blood sugar has been slowly trending down with time and IV fluids.  Patient had a CIWA score of 5, was treated with Ativan.  Patient remains tachycardic without respiratory or cardiac complaints and continues to report only complaint of request for placement in rehab.  Advised patient he could follow-up with outpatient rehab and list will be provided or he could go to behavioral health for possible evaluation after he is discharged from the emergency room.  Patient is agreeable with this plan.   [LM]    Clinical Course User Index [LM] Alden Hipp   MDM Rules/Calculators/A&P                      Final Clinical Impression(s) / ED Diagnoses Final diagnoses:  Polysubstance abuse [facility])    Rx / DC Orders ED Discharge Orders    None       Jeannie Fend, PA-C 04/15/19 1344    Virgina Norfolk, DO 04/15/19 1510

## 2019-04-15 NOTE — ED Notes (Signed)
Patient completed PO challenge with coffee and sandwich. No n/v noted.

## 2019-04-15 NOTE — ED Notes (Signed)
Patient verbalizes understanding of discharge instructions. Opportunity for questioning and answers were provided. Armband removed by staff, pt discharged from ED.  Ambulatory by self   

## 2019-04-15 NOTE — ED Triage Notes (Signed)
Pt comes via [facility] from home, pt reports spitting up bright red blood and then having sharp CP, pt also used cocaine tonight, no radiation, pain increases with palpation, PTA given 324 ASA and one nitro with some relief

## 2019-05-14 ENCOUNTER — Other Ambulatory Visit: Payer: Self-pay | Admitting: *Deleted

## 2019-05-14 DIAGNOSIS — Z20822 Contact with and (suspected) exposure to covid-19: Secondary | ICD-10-CM

## 2019-05-15 LAB — NOVEL CORONAVIRUS, NAA: SARS-CoV-2, NAA: DETECTED — AB

## 2019-05-16 ENCOUNTER — Telehealth: Payer: Self-pay | Admitting: Adult Health

## 2019-05-16 ENCOUNTER — Telehealth: Payer: Self-pay | Admitting: Nurse Practitioner

## 2019-05-16 NOTE — Telephone Encounter (Signed)
Called to Discuss with patient about Covid symptoms and the use of bamlanivimab, a monoclonal antibody infusion for those with mild to moderate Covid symptoms and at a high risk of hospitalization.     Pt is qualified for this infusion at the [facility] infusion center due to co-morbid conditions and/or a member of an at-risk group.     Unable to reach pt  

## 2019-05-16 NOTE — Telephone Encounter (Signed)
Called toDiscuss with patient about Covid symptoms and the use of bamlanivimab, a monoclonal antibody infusion for those with mild to moderate Covid symptoms and at a high risk of hospitalization.   Pt is qualified for this infusion at the [facility] infusion center due to co-morbid conditions and/or a member of an at-risk group.   Unable to reach pt  James Helfman  NP-C

## 2019-05-16 NOTE — Telephone Encounter (Signed)
Called toDiscuss with patient about recent SARS-CoV-2 + test, Covid symptoms and the use of bamlanivimab, a monoclonal antibody infusion for those with mild to moderate Covid symptoms and at a high risk of hospitalization.   Pt is qualified for this infusion at the [facility] infusion center due to co-morbid conditions and/or a member of an at-risk group.   Unable to reach pt  James Hamburger NP-C

## 2019-05-18 ENCOUNTER — Other Ambulatory Visit: Payer: Self-pay

## 2019-05-21 ENCOUNTER — Ambulatory Visit: Payer: Self-pay | Attending: Internal Medicine

## 2019-05-21 ENCOUNTER — Other Ambulatory Visit: Payer: Self-pay

## 2019-05-21 DIAGNOSIS — U071 COVID-19: Secondary | ICD-10-CM | POA: Insufficient documentation

## 2019-05-21 DIAGNOSIS — Z20822 Contact with and (suspected) exposure to covid-19: Secondary | ICD-10-CM

## 2019-05-22 LAB — NOVEL CORONAVIRUS, NAA: SARS-CoV-2, NAA: DETECTED — AB

## 2019-06-08 ENCOUNTER — Ambulatory Visit: Payer: Self-pay | Attending: Internal Medicine

## 2019-06-08 DIAGNOSIS — Z20822 Contact with and (suspected) exposure to covid-19: Secondary | ICD-10-CM

## 2019-06-09 LAB — NOVEL CORONAVIRUS, NAA: SARS-CoV-2, NAA: NOT DETECTED

## 2019-06-25 ENCOUNTER — Ambulatory Visit: Payer: Self-pay | Attending: Internal Medicine

## 2019-06-25 DIAGNOSIS — Z20822 Contact with and (suspected) exposure to covid-19: Secondary | ICD-10-CM | POA: Insufficient documentation

## 2019-06-26 LAB — NOVEL CORONAVIRUS, NAA: SARS-CoV-2, NAA: NOT DETECTED

## 2019-07-04 ENCOUNTER — Ambulatory Visit: Payer: Self-pay | Attending: Internal Medicine

## 2019-07-04 DIAGNOSIS — Z20822 Contact with and (suspected) exposure to covid-19: Secondary | ICD-10-CM | POA: Insufficient documentation

## 2019-07-05 LAB — NOVEL CORONAVIRUS, NAA: SARS-CoV-2, NAA: NOT DETECTED

## 2019-07-05 LAB — SARS-COV-2, NAA 2 DAY TAT

## 2019-07-17 ENCOUNTER — Other Ambulatory Visit: Payer: Self-pay

## 2019-07-17 ENCOUNTER — Ambulatory Visit: Payer: Self-pay | Admitting: Physician Assistant

## 2019-07-17 VITALS — BP 127/69 | HR 101 | Temp 98.2°F | Resp 18 | Ht 70.0 in | Wt 135.0 lb

## 2019-07-17 DIAGNOSIS — F1424 Cocaine dependence with cocaine-induced mood disorder: Secondary | ICD-10-CM

## 2019-07-17 DIAGNOSIS — E7800 Pure hypercholesterolemia, unspecified: Secondary | ICD-10-CM

## 2019-07-17 DIAGNOSIS — I10 Essential (primary) hypertension: Secondary | ICD-10-CM

## 2019-07-17 DIAGNOSIS — I429 Cardiomyopathy, unspecified: Secondary | ICD-10-CM

## 2019-07-17 DIAGNOSIS — F332 Major depressive disorder, recurrent severe without psychotic features: Secondary | ICD-10-CM

## 2019-07-17 DIAGNOSIS — E1165 Type 2 diabetes mellitus with hyperglycemia: Secondary | ICD-10-CM

## 2019-07-17 DIAGNOSIS — E11649 Type 2 diabetes mellitus with hypoglycemia without coma: Secondary | ICD-10-CM

## 2019-07-17 LAB — POCT GLYCOSYLATED HEMOGLOBIN (HGB A1C): Hemoglobin A1C: 13.1 % — AB (ref 4.0–5.6)

## 2019-07-17 LAB — GLUCOSE, POCT (MANUAL RESULT ENTRY): POC Glucose: 112 mg/dl — AB (ref 70–99)

## 2019-07-17 MED ORDER — CARVEDILOL 25 MG PO TABS: 25.0000 mg | ORAL_TABLET | Freq: Two times a day (BID) | ORAL | 0 refills | Status: DC

## 2019-07-17 MED ORDER — BUPROPION HCL ER (XL) 150 MG PO TB24: 150.0000 mg | ORAL_TABLET | Freq: Every day | ORAL | 0 refills | Status: DC

## 2019-07-17 MED ORDER — INSULIN GLARGINE 100 UNIT/ML ~~LOC~~ SOLN: 15.0000 [IU] | Freq: Every day | SUBCUTANEOUS | 0 refills | Status: DC

## 2019-07-17 MED ORDER — METFORMIN HCL 1000 MG PO TABS: 1000.0000 mg | ORAL_TABLET | Freq: Two times a day (BID) | ORAL | 0 refills | Status: DC

## 2019-07-17 MED ORDER — BLOOD GLUCOSE METER KIT: PACK | 0 refills | Status: AC

## 2019-07-17 MED ORDER — ATORVASTATIN CALCIUM 40 MG PO TABS: 40.0000 mg | ORAL_TABLET | Freq: Every day | ORAL | 0 refills | Status: DC

## 2019-07-17 MED ORDER — LISINOPRIL 10 MG PO TABS: 10.0000 mg | ORAL_TABLET | Freq: Every day | ORAL | 0 refills | Status: DC

## 2019-07-17 NOTE — Patient Instructions (Addendum)
Your A1c today was 13.1, I sent your medications to the health department, you will take Metformin 1000 mg twice a day and use Lantus 15 units at bedtime, if you stay consistent with this, you should see a decrease in your numbers.  Please check your blood sugar in the morning when you are fasting and then also before dinner.  Please keep a log of your blood sugar readings and take them with you for your medical visit.  You are going to follow-up at [facility] community health and wellness clinic, you will be given information on financial assistance, prescription assistance, substance abuse treatment assistance.   Diabetes Mellitus and Foot Care Foot care is an important part of your health, especially when you have diabetes. Diabetes may cause you to have problems because of poor blood flow (circulation) to your feet and legs, which can cause your skin to:  Become thinner and drier.  Break more easily.  Heal more slowly.  Peel and crack. You may also have nerve damage (neuropathy) in your legs and feet, causing decreased feeling in them. This means that you may not notice minor injuries to your feet that could lead to more serious problems. Noticing and addressing any potential problems early is the best way to prevent future foot problems. How to care for your feet Foot hygiene  Wash your feet daily with warm water and mild soap. Do not use hot water. Then, pat your feet and the areas between your toes until they are completely dry. Do not soak your feet as this can dry your skin.  Trim your toenails straight across. Do not dig under them or around the cuticle. File the edges of your nails with an emery board or nail file.  Apply a moisturizing lotion or petroleum jelly to the skin on your feet and to dry, brittle toenails. Use lotion that does not contain alcohol and is unscented. Do not apply lotion between your toes. Shoes and socks  Wear clean socks or stockings every day. Make sure  they are not too tight. Do not wear knee-high stockings since they may decrease blood flow to your legs.  Wear shoes that fit properly and have enough cushioning. Always look in your shoes before you put them on to be sure there are no objects inside.  To break in new shoes, wear them for just a few hours a day. This prevents injuries on your feet. Wounds, scrapes, corns, and calluses  Check your feet daily for blisters, cuts, bruises, sores, and redness. If you cannot see the bottom of your feet, use a mirror or ask someone for help.  Do not cut corns or calluses or try to remove them with medicine.  If you find a minor scrape, cut, or break in the skin on your feet, keep it and the skin around it clean and dry. You may clean these areas with mild soap and water. Do not clean the area with peroxide, alcohol, or iodine.  If you have a wound, scrape, corn, or callus on your foot, look at it several times a day to make sure it is healing and not infected. Check for: ? Redness, swelling, or pain. ? Fluid or blood. ? Warmth. ? Pus or a bad smell. General instructions  Do not cross your legs. This may decrease blood flow to your feet.  Do not use heating pads or hot water bottles on your feet. They may burn your skin. If you have lost feeling in  your feet or legs, you may not know this is happening until it is too late.  Protect your feet from hot and cold by wearing shoes, such as at the beach or on hot pavement.  Schedule a complete foot exam at least once a year (annually) or more often if you have foot problems. If you have foot problems, report any cuts, sores, or bruises to your health care provider immediately. Contact a health care provider if:  You have a medical condition that increases your risk of infection and you have any cuts, sores, or bruises on your feet.  You have an injury that is not healing.  You have redness on your legs or feet.  You feel burning or tingling in  your legs or feet.  You have pain or cramps in your legs and feet.  Your legs or feet are numb.  Your feet always feel cold.  You have pain around a toenail. Get help right away if:  You have a wound, scrape, corn, or callus on your foot and: ? You have pain, swelling, or redness that gets worse. ? You have fluid or blood coming from the wound, scrape, corn, or callus. ? Your wound, scrape, corn, or callus feels warm to the touch. ? You have pus or a bad smell coming from the wound, scrape, corn, or callus. ? You have a fever. ? You have a red line going up your leg. Summary  Check your feet every day for cuts, sores, red spots, swelling, and blisters.  Moisturize feet and legs daily.  Wear shoes that fit properly and have enough cushioning.  If you have foot problems, report any cuts, sores, or bruises to your health care provider immediately.  Schedule a complete foot exam at least once a year (annually) or more often if you have foot problems. This information is not intended to replace advice given to you by your health care provider. Make sure you discuss any questions you have with your health care provider. Document Revised: 12/20/2018 Document Reviewed: 04/30/2016 Elsevier Patient Education  [place].

## 2019-07-17 NOTE — Progress Notes (Signed)
New Patient Office Visit  Subjective:  Patient ID: James [name], male    DOB: 11-20-1960  Age: 59 y.o. MRN: [id number]  CC:  Chief Complaint  Patient presents with  . Annual Exam    HPI James [name] reports history of diabetes, hypertension, elevated cholesterol.  Reports that he has a significant history of substance abuse, states that he was sober for 5 years, and unfortunately for the last 18 months he has using crack cocaine again, states that he also has been abusing alcohol.  Reports that he has not been consistent with his medications, has been out of some of them.  Reports that when he does take his medications, reports he will only take 500 mg of Metformin twice a day because when he gives himself injections of sliding scale insulin and takes 1000 mg of Metformin reports he has episodes of hypoglycemia.  Reports he did give himself 13 units of insulin this morning around 8 AM, but has not been taking the Metformin.  Does not check blood pressure at home    Past Medical History:  Diagnosis Date  . Cellulitis and abscess of foot 09/29/2016  . Chronic systolic heart failure ([place])   . Depression   . Diabetes mellitus without complication ([place])   . Essential hypertension   . NICM (nonischemic cardiomyopathy) ([place]) EF 20-25%     Past Surgical History:  Procedure Laterality Date  . CARDIAC CATHETERIZATION N/A 04/30/2016   Procedure: Right/Left Heart Cath and Coronary Angiography;  Surgeon: Nelva Bush, MD;  Location: [place] [facility];  Service: Cardiovascular;  Laterality: N/A;  . FINGER SURGERY      Family History  Problem Relation Age of Onset  . Diabetes Other   . Hypertension Other   . Diabetes Mother   . Brain cancer Father   . Mental illness Brother   . Drug abuse Brother   . Diabetes Brother     Social History   Socioeconomic History  . Marital status: Married    Spouse name: Not on file  . Number of children: Not on file  . Years of  education: Not on file  . Highest education level: Not on file  Occupational History  . Not on file  Tobacco Use  . Smoking status: Current Every Day Smoker    Packs/day: 0.50    Years: 23.00    Pack years: 11.50    Types: Cigarettes  . Smokeless tobacco: Never Used  . Tobacco comment: Every other day   Substance and Sexual Activity  . Alcohol use: Yes  . Drug use: Yes    Types: Cocaine, "Crack" cocaine  . Sexual activity: Not Currently  Other Topics Concern  . Not on file  Social History Narrative  . Not on file   Social Determinants of Health   Financial Resource Strain:   . Difficulty of Paying Living Expenses:   Food Insecurity:   . Worried About Charity fundraiser in the Last Year:   . Arboriculturist in the Last Year:   Transportation Needs:   . Film/video editor (Medical):   Marland Kitchen Lack of Transportation (Non-Medical):   Physical Activity:   . Days of Exercise per Week:   . Minutes of Exercise per Session:   Stress:   . Feeling of Stress :   Social Connections:   . Frequency of Communication with Friends and Family:   . Frequency of Social Gatherings with Friends and Family:   .  Attends Religious Services:   . Active Member of Clubs or Organizations:   . Attends Archivist Meetings:   Marland Kitchen Marital Status:   Intimate Partner Violence:   . Fear of Current or Ex-Partner:   . Emotionally Abused:   Marland Kitchen Physically Abused:   . Sexually Abused:     ROS Review of Systems  Constitutional: Negative.   HENT: Negative.   Eyes: Negative.  Negative for visual disturbance.  Respiratory: Negative.  Negative for shortness of breath.   Cardiovascular: Negative.  Negative for chest pain and palpitations.  Gastrointestinal: Negative.   Endocrine: Positive for polydipsia and polyuria.  Genitourinary: Negative.   Musculoskeletal: Negative.   Skin: Negative.   Allergic/Immunologic: Negative.   Neurological: Negative.   Hematological: Negative.     Objective:    Today's Vitals: BP 127/69 (BP Location: Left Arm)   Pulse (!) 101   Temp 98.2 F (36.8 C) (Oral)   Resp 18   Ht 5' 10"  (1.778 m)   Wt 135 lb (61.2 kg)   SpO2 98%   BMI 19.37 kg/m   Physical Exam Vitals and nursing note reviewed.  Constitutional:      General: He is not in acute distress.    Appearance: Normal appearance. He is not ill-appearing.  HENT:     Head: Normocephalic and atraumatic.     Right Ear: External ear normal.     Left Ear: External ear normal.  Cardiovascular:     Rate and Rhythm: Rhythm regularly irregular.  Musculoskeletal:     Cervical back: Normal range of motion and neck supple.     Right lower leg: No edema.     Left lower leg: No edema.  Skin:    General: Skin is warm and dry.  Neurological:     General: No focal deficit present.     Mental Status: He is alert and oriented to person, place, and time.  Psychiatric:        Attention and Perception: Attention and perception normal.        Mood and Affect: Mood normal. Affect is tearful.        Speech: Speech normal.        Behavior: Behavior normal. Behavior is cooperative.        Thought Content: Thought content normal.        Cognition and Memory: Cognition and memory normal.        Judgment: Judgment normal.     Assessment & Plan:   Problem List Items Addressed This Visit      Cardiovascular and Mediastinum   Essential hypertension   Relevant Medications   atorvastatin (LIPITOR) 40 MG tablet   carvedilol (COREG) 25 MG tablet   lisinopril (ZESTRIL) 10 MG tablet   Cardiomyopathy- suspect NICM but etiology not yet determined   Relevant Medications   atorvastatin (LIPITOR) 40 MG tablet   carvedilol (COREG) 25 MG tablet   lisinopril (ZESTRIL) 10 MG tablet     Endocrine   Controlled type 2 diabetes mellitus with hyperglycemia (HCC)   Relevant Medications   atorvastatin (LIPITOR) 40 MG tablet   insulin glargine (LANTUS) 100 UNIT/ML injection   lisinopril (ZESTRIL) 10 MG tablet    metFORMIN (GLUCOPHAGE) 1000 MG tablet   Uncontrolled type 2 diabetes mellitus with hyperglycemia (HCC) - Primary   Relevant Medications   atorvastatin (LIPITOR) 40 MG tablet   insulin glargine (LANTUS) 100 UNIT/ML injection   lisinopril (ZESTRIL) 10 MG tablet   metFORMIN (GLUCOPHAGE) 1000  MG tablet   blood glucose meter kit and supplies     Other   HYPERCHOLESTEROLEMIA   Relevant Medications   atorvastatin (LIPITOR) 40 MG tablet   carvedilol (COREG) 25 MG tablet   lisinopril (ZESTRIL) 10 MG tablet   MDD (major depressive disorder), recurrent episode, severe (HCC)   Relevant Medications   buPROPion (WELLBUTRIN XL) 150 MG 24 hr tablet   Cocaine dependence with cocaine-induced mood disorder ([place])    1. Uncontrolled type 2 diabetes mellitus with hypoglycemia without coma [facility]) Patient has been noncompliant to medication regimen, gave patient education on how to use his medications, how and when to check blood glucose, keeping a log    Patient is uninsured, appointment was made for patient to follow-up at community health and wellness clinic with Dr. Joya Gaskins on 08/07/19 and information packet given for financial assistance.  Patient prefers to pick up his medications today at the health department where he has previously been able to get them without cost.  Prescription for new meter and strips given as well  Gave patient education on proper foot care, dental care and yearly vision check.  Patient is unable to afford dental visit or eye exam at this time - A1c - POCT URINALYSIS DIP (CLINITEK) - Glucose (CBG)  2. Severe episode of recurrent major depressive disorder, without psychotic features (HCC) Resume Wellbutrin  - buPROPion (WELLBUTRIN XL) 150 MG 24 hr tablet; Take 1 tablet (150 mg total) by mouth daily.  Dispense: 30 tablet; Refill: 0  3. Cocaine dependence with cocaine-induced mood disorder [facility]) Patient does desire to become sober, will refer patient to social worker at  [employer] and wellness center  4. Uncontrolled type 2 diabetes mellitus with hyperglycemia (HCC)  - insulin glargine (LANTUS) 100 UNIT/ML injection; Inject 0.15 mLs (15 Units total) into the skin at bedtime. For diabetes management  Dispense: 5 mL; Refill: 0 - metFORMIN (GLUCOPHAGE) 1000 MG tablet; Take 1 tablet (1,000 mg total) by mouth 2 (two) times daily with a meal. For diabetes management  Dispense: 60 tablet; Refill: 0 - blood glucose meter kit and supplies; Dispense based on patient and insurance preference. Use up to four times daily as directed. (FOR ICD-9 250.00, 250.01).  Dispense: 1 each; Refill: 0  5. HYPERCHOLESTEROLEMIA Resume Lipitor, morning appointment made(on 08/03/19) for patient for fasting labs to be completed - atorvastatin (LIPITOR) 40 MG tablet; Take 1 tablet (40 mg total) by mouth daily. For high cholesterol  Dispense: 30 tablet; Refill: 0  6. Essential hypertension Resume carvedilol, lisinopril - carvedilol (COREG) 25 MG tablet; Take 1 tablet (25 mg total) by mouth 2 (two) times daily with a meal.  Dispense: 60 tablet; Refill: 0 - lisinopril (ZESTRIL) 10 MG tablet; Take 1 tablet (10 mg total) by mouth daily.  Dispense: 30 tablet; Refill: 0  7. Cardiomyopathy, unspecified type [facility]) Patient record does show history of cardiomyopathy, patient will need follow up with cardiology, unable to afford at this time, encouraged patient to continue with follow up at [facility] and financial assistance.   Outpatient Encounter Medications as of 07/17/2019  Medication Sig  . atorvastatin (LIPITOR) 40 MG tablet Take 1 tablet (40 mg total) by mouth daily. For high cholesterol  . blood glucose meter kit and supplies Dispense based on patient and insurance preference. Use up to four times daily as directed. (FOR ICD-9 250.00, 250.01).  Marland Kitchen buPROPion (WELLBUTRIN XL) 150 MG 24 hr tablet Take 1 tablet (150 mg total) by mouth daily.  . carvedilol (  COREG) 25 MG tablet Take 1 tablet (25 mg  total) by mouth 2 (two) times daily with a meal.  . insulin glargine (LANTUS) 100 UNIT/ML injection Inject 0.15 mLs (15 Units total) into the skin at bedtime. For diabetes management  . lisinopril (ZESTRIL) 10 MG tablet Take 1 tablet (10 mg total) by mouth daily.  . metFORMIN (GLUCOPHAGE) 1000 MG tablet Take 1 tablet (1,000 mg total) by mouth 2 (two) times daily with a meal. For diabetes management  . [DISCONTINUED] atorvastatin (LIPITOR) 40 MG tablet Take 1 tablet (40 mg total) by mouth daily. For high cholesterol  . [DISCONTINUED] buPROPion (WELLBUTRIN XL) 150 MG 24 hr tablet Take 1 tablet (150 mg total) by mouth daily.  . [DISCONTINUED] carvedilol (COREG) 25 MG tablet Take 1 tablet (25 mg total) by mouth 2 (two) times daily with a meal.  . [DISCONTINUED] insulin glargine (LANTUS) 100 UNIT/ML injection Inject 0.15 mLs (15 Units total) into the skin at bedtime. For diabetes management  . [DISCONTINUED] lisinopril (ZESTRIL) 10 MG tablet Take 1 tablet (10 mg total) by mouth daily.  . [DISCONTINUED] metFORMIN (GLUCOPHAGE) 1000 MG tablet Take 1 tablet (1,000 mg total) by mouth 2 (two) times daily with a meal. For diabetes management   No facility-administered encounter medications on file as of 07/17/2019.   I have reviewed the patient's medical history (PMH, PSH, Social History, Family History, Medications, and allergies) , and have been updated if relevant. I spent 40 minutes reviewing chart and  face to face time with patient.     Follow-up: Return for To establish PCP on 08/03/19 at [facility] with Dr. Joya Gaskins.   Loraine Grip Lilah Mijangos, PA-C

## 2019-07-18 ENCOUNTER — Telehealth: Payer: Self-pay

## 2019-07-18 MED FILL — ATORVASTATIN CALCIUM 40 MG: 40 | 30 days supply | Qty: 30 | Fill #0

## 2019-07-18 MED FILL — CARVEDILOL 25 MG TABLET: 25 | 30 days supply | Qty: 60 | Fill #0

## 2019-07-18 MED FILL — BUPROPION HCL XL 150 MG TAB: 150 | 30 days supply | Qty: 30 | Fill #0

## 2019-07-18 MED FILL — LISINOPRIL 10 MG TABS: 10 | 30 days supply | Qty: 30 | Fill #0

## 2019-07-18 MED FILL — !LANTUS SOLOSTAR 100UNITS/M: 100 | 20 days supply | Qty: 3 | Fill #0

## 2019-07-18 MED FILL — metFORMIN HCL 1000 MG TABS: 1000 | 30 days supply | Qty: 60 | Fill #0

## 2019-07-18 NOTE — Telephone Encounter (Signed)
Contacted pt. Informing him of upcoming televisit with LCSW at [facility].

## 2019-07-21 ENCOUNTER — Ambulatory Visit: Payer: Self-pay | Attending: Internal Medicine

## 2019-07-21 DIAGNOSIS — Z23 Encounter for immunization: Secondary | ICD-10-CM

## 2019-07-21 NOTE — Progress Notes (Signed)
   Covid-19 Vaccination Clinic  Name:  James [name]    MRN: [id number] DOB: 11-Aug-1960  07/21/2019  James [name] was observed post Covid-19 immunization for 15 minutes without incident. He was provided with Vaccine Information Sheet and instruction to access the V-Safe system.   James [name] was instructed to call 911 with any severe reactions post vaccine: Marland Kitchen Difficulty breathing  . Swelling of face and throat  . A fast heartbeat  . A bad rash all over body  . Dizziness and weakness   Immunizations Administered    Name Date Dose VIS Date Route   Pfizer COVID-19 Vaccine 07/21/2019  1:38 PM 0.3 mL 03/23/2019 Intramuscular   Manufacturer: [employer], [employer]   Lot: [id number]   NDC: [address]-8

## 2019-07-23 ENCOUNTER — Ambulatory Visit: Payer: Self-pay | Admitting: Licensed Clinical Social Worker

## 2019-07-23 ENCOUNTER — Other Ambulatory Visit: Payer: Self-pay

## 2019-07-30 ENCOUNTER — Other Ambulatory Visit: Payer: Self-pay

## 2019-07-30 ENCOUNTER — Ambulatory Visit: Payer: Self-pay | Attending: Family Medicine | Admitting: Licensed Clinical Social Worker

## 2019-07-30 DIAGNOSIS — F1910 Other psychoactive substance abuse, uncomplicated: Secondary | ICD-10-CM

## 2019-07-30 DIAGNOSIS — F332 Major depressive disorder, recurrent severe without psychotic features: Secondary | ICD-10-CM

## 2019-07-30 NOTE — Progress Notes (Signed)
Integrated Behavioral Health Visit via Telemedicine (Telephone)  07/30/2019 James [name] [id number]   Session Start time: 1:30 PM  Session End time: 1:50 PM Total time: 20  Referring Provider: Kathie Dike Type of Visit: Telephonic Patient location: Home [facility] Provider location: Office All persons participating in visit: LCSW and Patient  Confirmed patient's address: Yes  Confirmed patient's phone number: Yes  Any changes to demographics: No   Confirmed patient's insurance: Yes  Any changes to patient's insurance: No   Discussed confidentiality: Yes    The following statements were read to the patient and/or legal guardian that are established with the [facility] Provider.  "The purpose of this phone visit is to provide behavioral health care while limiting exposure to the coronavirus (COVID19).  There is a possibility of technology failure and discussed alternative modes of communication if that failure occurs."  "By engaging in this telephone visit, you consent to the provision of healthcare.  Additionally, you authorize for your insurance to be billed for the services provided during this telephone visit."   Patient and/or legal guardian consented to telephone visit: Yes   PRESENTING CONCERNS: Patient and/or family reports the following symptoms/concerns: Pt reports that depression and anxiety symptoms have decreased a bit. Symptoms triggered by relapse with crack cocaine.  Duration of problem: Ongoing; Severity of problem: moderate  STRENGTHS (Protective Factors/Coping Skills): Pt is participating in medication management Pt receives strong support from spouse Pt identified four grandchildren as protective factors Pt receives unemployment  GOALS ADDRESSED: Patient will: 1.  Reduce symptoms of: anxiety, depression and stress  2.  Increase knowledge and/or ability of: coping skills and healthy habits  3.  Demonstrate ability to: Increase healthy adjustment to  current life circumstances, Increase adequate support systems for patient/family and Decrease self-medicating behaviors  INTERVENTIONS: Interventions utilized:  Solution-Focused Strategies, Supportive Counseling and Psychoeducation and/or Health Education Standardized Assessments completed: Not Needed  ASSESSMENT: Patient currently experiencing depression and anxiety triggered by recent relapse.   Patient may benefit from substance use treatment. Pt was successful in identifying healthy coping skills to assist with management of symptoms (praying and reading), including, medication management. LCSW provided patient with contact information for [facility] to assist with obtaining local substance use and behavioral health treatment. Pt was strongly encouraged to apply for financial assistance to assist with medical costs.    PLAN: 1. Follow up with behavioral health clinician on : Contact LCSW with additional behavioral health and/or resource needs 2. Behavioral recommendations: Utilize strategies discussed and resources. Comply with medication management 3. Referral(s): Integrated Art gallery manager (In Clinic), [facility] Mental Health Services (LME/Outside Clinic) and Substance Abuse Program  Bridgett Larsson, [place] 08/14/19 3:49 PM

## 2019-08-07 ENCOUNTER — Ambulatory Visit: Payer: Self-pay | Admitting: Critical Care Medicine

## 2019-08-07 NOTE — Progress Notes (Deleted)
Subjective:    Patient ID: James [name], male    DOB: [birth year], 59 y.o.   MRN: [id number]    James [name] reports history of diabetes, hypertension, elevated cholesterol.  Reports that he has a significant history of substance abuse, states that he was sober for 5 years, and unfortunately for the last 18 months he has using crack cocaine again, states that he also has been abusing alcohol.  Reports that he has not been consistent with his medications, has been out of some of them.  Reports that when he does take his medications, reports he will only take 500 mg of Metformin twice a day because when he gives himself injections of sliding scale insulin and takes 1000 mg of Metformin reports he has episodes of hypoglycemia.     Reports he did give himself 13 units of insulin this morning around 8 AM, but has not been taking the Metformin.     Does not check blood pressure at hom                                                                                                                                                                                                                                                                           ROS  Review of Systems   Constitutional: Negative.    HENT: Negative.    Eyes: Negative.  Negative for visual disturbance.   Respiratory: Negative.  Negative for shortness of breath.    Cardiovascular: Negative.  Negative for chest pain and palpitations.   Gastrointestinal: Negative.    Endocrine: Positive for polydipsia and polyuria.   Genitourinary: Negative.    Musculoskeletal: Negative.    Skin: Negative.    Allergic/Immunologic: Negative.    Neurological: Negative.    Hematological: Negative.           Objective:        Today's Vitals: BP 127/69 (BP Location: Left Arm)   Pulse (!) 101   Temp 98.2 F (36.8 C) (Oral)   Resp 18   Ht 5' 10"  (1.778 m)   Wt 135 lb (61.2 kg)   SpO2 98%   BMI 19.37 kg/m      Physical Exam  Vitals and nursing note reviewed.  Constitutional:       General: He is not in acute distress.    Appearance: Normal appearance. He is not ill-appearing.  HENT:      Head: Normocephalic and atraumatic.     Right Ear: External ear normal.     Left Ear: External ear normal.  Cardiovascular:      Rate and Rhythm: Rhythm regularly irregular.  Musculoskeletal:     Cervical back: Normal range of motion and neck supple.     Right lower leg: No edema.     Left lower leg: No edema.   - A1c  - POCT URINALYSIS DIP (CLINITEK)  - Glucose (CBG)     2. Severe episode of recurrent major depressive disorder, without psychotic features (HCC)  Resume Wellbutrin     - buPROPion (WELLBUTRIN XL) 150 MG 24 hr tablet; Take 1 tablet (150 mg total) by mouth daily.  Dispense: 30 tablet; Refill: 0     3. Cocaine dependence with cocaine-induced mood disorder [facility])  Patient does desire to become sober, will refer patient to social worker at [employer] and wellness center     4. Uncontrolled type 2 diabetes mellitus with hyperglycemia (HCC)     - insulin glargine (LANTUS) 100 UNIT/ML injection; Inject 0.15 mLs (15 Units total) into the skin at bedtime. For diabetes management  Dispense: 5 mL; Refill: 0  - metFORMIN (GLUCOPHAGE) 1000 MG tablet; Take 1 tablet (1,000 mg total) by mouth 2 (two) times daily with a meal. For diabetes management  Dispense: 60 tablet; Refill: 0  - blood glucose meter kit and supplies; Dispense based on patient and insurance preference. Use up to four times daily as directed. (FOR ICD-9 250.00, 250.01).  Dispense: 1 each; Refill: 0     5. HYPERCHOLESTEROLEMIA  Resume Lipitor, morning appointment made(on 08/03/19) for patient for fasting labs to be  completed  - atorvastatin (LIPITOR) 40 MG tablet; Take 1 tablet (40 mg total) by mouth daily. For high cholesterol  Dispense: 30 tablet; Refill: 0     6. Essential hypertension  Resume carvedilol, lisinopril  - carvedilol (COREG) 25 MG tablet; Take 1 tablet (25 mg total) by mouth 2 (two) times daily with a meal.  Dispense: 60 tablet; Refill: 0  - lisinopril (ZESTRIL) 10 MG tablet; Take 1 tablet (10 mg total) by mouth daily.  Dispense: 30 tablet; Refill: 0     7. Cardiomyopathy, unspecified type [facility])  Patient record does show history of cardiomyopathy, patient will need follow up with cardiology, unable to afford at this time, encouraged patient to continue with follow up at [facility] and financial assistance.         Review of Systems     Objective:   Physical Exam        Assessment & Plan:

## 2019-08-13 ENCOUNTER — Ambulatory Visit: Payer: Self-pay | Attending: Internal Medicine

## 2019-08-13 DIAGNOSIS — Z23 Encounter for immunization: Secondary | ICD-10-CM

## 2019-08-13 NOTE — Progress Notes (Signed)
   Covid-19 Vaccination Clinic  Name:  James [name]    MRN: [id number] DOB: 1960-09-24  08/13/2019  Mr. James [name] was observed post Covid-19 immunization for 15 minutes without incident. He was provided with Vaccine Information Sheet and instruction to access the V-Safe system.   Mr. James [name] was instructed to call 911 with any severe reactions post vaccine: Marland Kitchen Difficulty breathing  . Swelling of face and throat  . A fast heartbeat  . A bad rash all over body  . Dizziness and weakness   Immunizations Administered    Name Date Dose VIS Date Route   Pfizer COVID-19 Vaccine 08/13/2019  1:36 PM 0.3 mL 06/06/2018 Intramuscular   Manufacturer: [employer], [employer]   Lot: [id number]   NDC: [address]-9

## 2019-08-14 ENCOUNTER — Telehealth: Payer: Self-pay

## 2019-08-14 NOTE — Telephone Encounter (Signed)
Staff attempted to contact patient to check in and possibly reschedule a medical appointment. Staff will attempt to contact patient within the next week

## 2019-08-15 ENCOUNTER — Ambulatory Visit: Payer: Self-pay | Attending: Internal Medicine

## 2019-08-15 DIAGNOSIS — Z20822 Contact with and (suspected) exposure to covid-19: Secondary | ICD-10-CM

## 2019-08-16 LAB — SARS-COV-2, NAA 2 DAY TAT

## 2019-08-16 LAB — NOVEL CORONAVIRUS, NAA: SARS-CoV-2, NAA: NOT DETECTED

## 2019-09-05 ENCOUNTER — Emergency Department (HOSPITAL_COMMUNITY): Payer: Self-pay

## 2019-09-05 ENCOUNTER — Emergency Department (HOSPITAL_COMMUNITY)
Admission: EM | Admit: 2019-09-05 | Discharge: 2019-09-05 | Disposition: A | Discharge status: Home / Self Care | Payer: Self-pay | Attending: Emergency Medicine | Admitting: Emergency Medicine

## 2019-09-05 ENCOUNTER — Other Ambulatory Visit: Payer: Self-pay

## 2019-09-05 ENCOUNTER — Encounter (HOSPITAL_COMMUNITY): Payer: Self-pay

## 2019-09-05 DIAGNOSIS — R739 Hyperglycemia, unspecified: Secondary | ICD-10-CM

## 2019-09-05 DIAGNOSIS — E1165 Type 2 diabetes mellitus with hyperglycemia: Secondary | ICD-10-CM | POA: Insufficient documentation

## 2019-09-05 DIAGNOSIS — F1490 Cocaine use, unspecified, uncomplicated: Secondary | ICD-10-CM | POA: Insufficient documentation

## 2019-09-05 DIAGNOSIS — Z79899 Other long term (current) drug therapy: Secondary | ICD-10-CM | POA: Insufficient documentation

## 2019-09-05 DIAGNOSIS — R0789 Other chest pain: Secondary | ICD-10-CM | POA: Insufficient documentation

## 2019-09-05 DIAGNOSIS — F17210 Nicotine dependence, cigarettes, uncomplicated: Secondary | ICD-10-CM | POA: Insufficient documentation

## 2019-09-05 DIAGNOSIS — Z794 Long term (current) use of insulin: Secondary | ICD-10-CM | POA: Insufficient documentation

## 2019-09-05 DIAGNOSIS — R079 Chest pain, unspecified: Secondary | ICD-10-CM

## 2019-09-05 LAB — TROPONIN I (HIGH SENSITIVITY)
Troponin I (High Sensitivity): 13 ng/L (ref <18)
Troponin I (High Sensitivity): 11 ng/L (ref <18)

## 2019-09-05 LAB — CBC
HCT: 41 % (ref 39.0–52.0)
Hemoglobin: 14 g/dL (ref 13.0–17.0)
MCH: 30.1 pg (ref 26.0–34.0)
MCHC: 34.1 g/dL (ref 30.0–36.0)
MCV: 88.2 fL (ref 80.0–100.0)
Platelets: 176 10*3/uL (ref 150–400)
RBC: 4.65 MIL/uL (ref 4.22–5.81)
RDW: 12.8 % (ref 11.5–15.5)
WBC: 3 10*3/uL — ABNORMAL LOW (ref 4.0–10.5)
nRBC: 0 % (ref 0.0–0.2)

## 2019-09-05 LAB — CBG MONITORING, ED
Glucose-Capillary: 505 mg/dL (ref 70–99)
Glucose-Capillary: 316 mg/dL — ABNORMAL HIGH (ref 70–99)

## 2019-09-05 LAB — ETHANOL: Alcohol, Ethyl (B): <10 mg/dL (ref <10)

## 2019-09-05 MED ORDER — SODIUM CHLORIDE 0.9 % IV BOLUS: 1000.0000 mL | Freq: Once | INTRAVENOUS | Status: DC

## 2019-09-05 MED ORDER — SODIUM CHLORIDE 0.9 % IV BOLUS
500.0000 mL | Freq: Once | INTRAVENOUS | Status: AC
  Administered 2019-09-05: 500 mL via INTRAVENOUS

## 2019-09-05 MED ORDER — INSULIN ASPART 100 UNIT/ML ~~LOC~~ SOLN
10.0000 [IU] | Freq: Once | SUBCUTANEOUS | Status: AC
  Administered 2019-09-05: 10 [IU] via INTRAVENOUS

## 2019-09-05 NOTE — ED Provider Notes (Signed)
[facility] EMERGENCY DEPARTMENT Provider Note   CSN: [id number] Arrival date & time: 09/05/19  1809     History Chief Complaint  Patient presents with  . Chest Pain    James [name] is a 59 y.o. male.  HPI Patient presents with chest pain.  Police came to his house with warrants to arrest him.  Once he was informed that he was going be going to jail patient states he developed chest pain in his mid chest and became suicidal.  Does have a history of CHF and polysubstance abuse.  States he has smoked crack cocaine about an hour before the police got there.  Pain is improved but still some pressure in the anterior chest.  No fevers.  No cough.  Denies suicide attempt.  History of nonischemic cardiomyopathy.    Past Medical History:  Diagnosis Date  . Cellulitis and abscess of foot 09/29/2016  . Chronic systolic heart failure ([place])   . Depression   . Diabetes mellitus without complication ([place])   . Essential hypertension   . NICM (nonischemic cardiomyopathy) ([place]) EF 20-25%     Patient Active Problem List   Diagnosis Date Noted  . Cocaine dependence with cocaine-induced mood disorder ([place])   . MDD (major depressive disorder), recurrent episode, severe ([place]) 02/10/2019  . Polysubstance abuse ([place]) 07/12/2017  . Suicidal ideations 07/12/2017  . Cocaine abuse ([place])   . MDD (major depressive disorder) 01/14/2017  . Cellulitis of left foot 09/28/2016  . Cellulitis 09/28/2016  . Cardiomyopathy- suspect NICM but etiology not yet determined 04/27/2016  . Acute combined systolic and diastolic heart failure ([place]) 04/27/2016  . History of major depression 04/27/2016  . CAP (community acquired pneumonia) 04/26/2016  . Acute respiratory failure with hypoxia ([place]) 04/26/2016  . Controlled type 2 diabetes mellitus with hyperglycemia ([place]) 04/26/2016  . Uncontrolled type 2 diabetes mellitus with hyperglycemia ([place]) 04/26/2016  . Alcohol use disorder, mild, abuse  12/12/2015  . MDD (major depressive disorder), recurrent severe, without psychosis ([place]) 06/26/2015  . History of cocaine abuse ([place]) 06/25/2015  . SPONDYLOSIS, CERVICAL, WITH RADICULOPATHY 02/25/2009  . MUSCLE SPASM, BACK 02/12/2009  . LEUKOPENIA, MILD 08/19/2008  . ECZEMA, HANDS 08/06/2008  . HYPERCHOLESTEROLEMIA 05/09/2007  . SINUSITIS, CHRONIC 05/01/2007  . ALLERGIC RHINITIS 12/21/2006  . Essential hypertension 12/07/2006    Past Surgical History:  Procedure Laterality Date  . CARDIAC CATHETERIZATION N/A 04/30/2016   Procedure: Right/Left Heart Cath and Coronary Angiography;  Surgeon: Nelva Bush, MD;  Location: [facility] CV LAB;  Service: Cardiovascular;  Laterality: N/A;  . FINGER SURGERY         Family History  Problem Relation Age of Onset  . Diabetes Other   . Hypertension Other   . Diabetes Mother   . Brain cancer Father   . Mental illness Brother   . Drug abuse Brother   . Diabetes Brother     Social History   Tobacco Use  . Smoking status: Current Every Day Smoker    Packs/day: 0.50    Years: 23.00    Pack years: 11.50    Types: Cigarettes  . Smokeless tobacco: Never Used  . Tobacco comment: Every other day   Substance Use Topics  . Alcohol use: Yes  . Drug use: Yes    Types: Cocaine, "Crack" cocaine    Home Medications Prior to Admission medications   Medication Sig Start Date End Date Taking? Authorizing Provider  atorvastatin (LIPITOR) 40 MG tablet Take 1 tablet (  40 mg total) by mouth daily. For high cholesterol 07/17/19   Mayers, Cari S, PA-C  blood glucose meter kit and supplies Dispense based on patient and insurance preference. Use up to four times daily as directed. (FOR ICD-9 250.00, 250.01). 07/17/19   Mayers, Cari S, PA-C  buPROPion (WELLBUTRIN XL) 150 MG 24 hr tablet Take 1 tablet (150 mg total) by mouth daily. 07/17/19   Mayers, Cari S, PA-C  carvedilol (COREG) 25 MG tablet Take 1 tablet (25 mg total) by mouth 2 (two) times daily with a  meal. 07/17/19   Mayers, Cari S, PA-C  insulin glargine (LANTUS) 100 UNIT/ML injection Inject 0.15 mLs (15 Units total) into the skin at bedtime. For diabetes management 07/17/19   Mayers, Cari S, PA-C  lisinopril (ZESTRIL) 10 MG tablet Take 1 tablet (10 mg total) by mouth daily. 07/17/19   Mayers, Cari S, PA-C  metFORMIN (GLUCOPHAGE) 1000 MG tablet Take 1 tablet (1,000 mg total) by mouth 2 (two) times daily with a meal. For diabetes management 07/17/19   Mayers, Loraine Grip, PA-C    Allergies    Patient has no known allergies.  Review of Systems   Review of Systems  Constitutional: Negative for appetite change.  HENT: Negative for congestion.   Respiratory: Negative for shortness of breath.   Cardiovascular: Positive for chest pain.  Gastrointestinal: Negative for abdominal pain.  Genitourinary: Negative for flank pain.  Musculoskeletal: Negative for back pain.  Skin: Negative for rash.  Neurological: Negative for weakness.  Psychiatric/Behavioral: Positive for suicidal ideas. Negative for confusion.    Physical Exam Updated Vital Signs BP (!) 149/97 (BP Location: Right Arm)   Pulse 90   Temp 98.5 F (36.9 C) (Oral)   Resp 18   Ht 5' 10"  (1.778 m)   Wt 62 kg   SpO2 100%   BMI 19.61 kg/m   Physical Exam Vitals and nursing note reviewed.  HENT:     Head: Normocephalic.  Cardiovascular:     Rate and Rhythm: Normal rate and regular rhythm.  Pulmonary:     Breath sounds: No wheezing, rhonchi or rales.  Chest:     Chest wall: No tenderness.  Abdominal:     Tenderness: There is no abdominal tenderness.  Musculoskeletal:     Cervical back: Neck supple.     Right lower leg: No edema.     Left lower leg: No edema.  Skin:    General: Skin is warm.     Capillary Refill: Capillary refill takes less than 2 seconds.  Neurological:     Mental Status: He is alert and oriented to person, place, and time.  Psychiatric:        Mood and Affect: Mood normal.     ED Results / Procedures /  Treatments   Labs (all labs ordered are listed, but only abnormal results are displayed) Labs Reviewed  COMPREHENSIVE METABOLIC PANEL - Abnormal; Notable for the following components:      Result Value   Sodium 126 (*)    Chloride 88 (*)    Glucose, Bld 576 (*)    Total Protein 6.3 (*)    All other components within normal limits  CBC - Abnormal; Notable for the following components:   WBC 3.0 (*)    All other components within normal limits  CBG MONITORING, ED - Abnormal; Notable for the following components:   Glucose-Capillary 505 (*)    All other components within normal limits  CBG MONITORING, ED -  Abnormal; Notable for the following components:   Glucose-Capillary 316 (*)    All other components within normal limits  ETHANOL  RAPID URINE DRUG SCREEN, HOSP PERFORMED  TROPONIN I (HIGH SENSITIVITY)  TROPONIN I (HIGH SENSITIVITY)    EKG EKG Interpretation  Date/Time:  Wednesday Sep 05 2019 18:17:55 EDT Ventricular Rate:  95 PR Interval:    QRS Duration: 92 QT Interval:  358 QTC Calculation: 450 R Axis:   86 Text Interpretation: Sinus rhythm Biatrial enlargement Borderline right axis deviation Probable left ventricular hypertrophy Nonspecific T abnormalities, diffuse leads Confirmed by Davonna Belling [phone]) on 09/05/2019 6:22:15 PM ED ECG REPORT   Date: 09/05/2019  Rate: 112  Rhythm: ventricular bigeminy  QRS Axis: normal  Intervals: normal  ST/T Wave abnormalities: nonspecific ST/T changes  Conduction Disutrbances:none  Narrative Interpretation:   Old EKG Reviewed: changes noted Bigeminy new from tracing earlier today.  Radiology DG Chest Portable 1 View  Result Date: 09/05/2019 CLINICAL DATA:  Chest pain EXAM: PORTABLE CHEST 1 VIEW COMPARISON:  04/15/2019 FINDINGS: The heart size is stable. There is no pneumothorax. No large pleural effusion. No focal infiltrate. There is a rounded density projecting over the anterior sixth rib on the left. There may be mild  aortic calcifications. There is no definite acute osseous abnormality. IMPRESSION: 1. No acute cardiopulmonary process. 2. Rounded density projecting over the anterior sixth rib on the left. An outpatient two-view chest x-ray with nipple markers is recommended for further evaluation. Electronically Signed   By: Constance Holster M.D.   On: 09/05/2019 18:53    Procedures Procedures (including critical care time)  Medications Ordered in ED Medications  insulin aspart (novoLOG) injection 10 Units (10 Units Intravenous Given 09/05/19 2031)  sodium chloride 0.9 % bolus 500 mL (0 mLs Intravenous Stopped 09/05/19 2203)    ED Course  I have reviewed the triage vital signs and the nursing notes.  Pertinent labs & imaging results that were available during my care of the patient were reviewed by me and considered in my medical decision making (see chart for details).    MDM Rules/Calculators/A&P                      Patient presented with chest pain after using cocaine and being arrested.  EKG reassuring.  Troponin negative x2.  Had hyperglycemia but is improved.  Had density on x-ray will need outpatient follow-up with follow-up nipple marker.  However patient at this point seems stable for discharge to jail. Final Clinical Impression(s) / ED Diagnoses Final diagnoses:  Nonspecific chest pain  Hyperglycemia    Rx / DC Orders ED Discharge Orders    None       Davonna Belling, MD 09/05/19 2239

## 2019-09-05 NOTE — Discharge Instructions (Addendum)
Follow-up with your doctors.  Your chest pain does not appear to be from your heart today.

## 2019-09-05 NOTE — ED Notes (Signed)
Patient verbalizes understanding of discharge instructions. Opportunity for questioning and answers were provided. Armband removed by staff, pt discharged from ED ambulatory in police custody.  

## 2019-09-05 NOTE — ED Triage Notes (Addendum)
Pt BIB [facility] accompanied by [employer] for eval of chest pain. Pt was arrested by [employer] for warrants.When PD stood pt up and informed him he would be going to jail, pt reported he was having a heart attack and sudden onset CP. EMS admin 1 SL NTG w/out change in pain, pressure dropped from 119 to 96, did also rec 324 ASA. Pt reports +crack cocaine use today, states hx of same. Also hx of DMII, CHF, polysubstance abuse. CBG 560 for EMS.

## 2019-09-06 LAB — COMPREHENSIVE METABOLIC PANEL
ALT: 20 U/L (ref 0–44)
AST: 17 U/L (ref 15–41)
Albumin: 3.6 g/dL (ref 3.5–5.0)
Alkaline Phosphatase: 80 U/L (ref 38–126)
Anion gap: 15 (ref 5–15)
BUN: 12 mg/dL (ref 8–23)
CO2: 23 mmol/L (ref 22–32)
Calcium: 9.2 mg/dL (ref 8.9–10.3)
Chloride: 88 mmol/L — ABNORMAL LOW (ref 98–111)
Creatinine, Ser: 1.15 mg/dL (ref 0.61–1.24)
GFR calc Af Amer: >60 mL/min (ref >60)
GFR calc non Af Amer: >60 mL/min (ref >60)
Glucose, Bld: 576 mg/dL (ref 70–99)
Potassium: 4.1 mmol/L (ref 3.5–5.1)
Sodium: 126 mmol/L — ABNORMAL LOW (ref 135–145)
Total Bilirubin: 0.7 mg/dL (ref 0.3–1.2)
Total Protein: 6.3 g/dL — ABNORMAL LOW (ref 6.5–8.1)

## 2019-11-13 ENCOUNTER — Other Ambulatory Visit: Payer: Self-pay | Admitting: Critical Care Medicine

## 2019-11-13 DIAGNOSIS — E1165 Type 2 diabetes mellitus with hyperglycemia: Secondary | ICD-10-CM

## 2019-11-13 DIAGNOSIS — E7800 Pure hypercholesterolemia, unspecified: Secondary | ICD-10-CM

## 2019-11-13 DIAGNOSIS — F332 Major depressive disorder, recurrent severe without psychotic features: Secondary | ICD-10-CM

## 2019-11-13 DIAGNOSIS — I10 Essential (primary) hypertension: Secondary | ICD-10-CM

## 2019-11-13 MED FILL — $LANTUS SOLOSTAR 100 UNITS/: 100 | 40 days supply | Qty: 6 | Fill #1

## 2019-11-13 NOTE — Telephone Encounter (Signed)
Patient's wife called due to his number is not in service, left VM on wife's phone to have him call [facility] to schedule an appointment in order to receive refills.

## 2019-11-13 NOTE — Telephone Encounter (Signed)
Requested medication (s) are due for refill today: Yes  Requested medication (s) are on the active medication list: Yes  Last refill:  07/18/19  Future visit scheduled: Yes  Notes to clinic:  Last refilled by another provider, unable to refill     Requested Prescriptions  Pending Prescriptions Disp Refills   lisinopril (ZESTRIL) 10 MG tablet [Pharmacy Med Name: LISINOPRIL 10 MG TABS 10 Tablet] 30 tablet 0    Sig: TAKE 1 TABLET BY MOUTH DAILY.      Cardiovascular:  ACE Inhibitors Failed - 11/13/2019  4:13 PM      Failed - Last BP in normal range    BP Readings from Last 1 Encounters:  09/05/19 (!) 149/97          Passed - Cr in normal range and within 180 days    Creat  Date Value Ref Range Status  06/15/2016 0.97 0.70 - 1.33 mg/dL Final    Comment:      For patients > or = 61 years of age: The upper reference limit for Creatinine is approximately 13% higher for people identified as African-American.      Creatinine, Ser  Date Value Ref Range Status  09/05/2019 1.15 0.61 - 1.24 mg/dL Final          Passed - K in normal range and within 180 days    Potassium  Date Value Ref Range Status  09/05/2019 4.1 3.5 - 5.1 mmol/L Final          Passed - Patient is not pregnant      Passed - Valid encounter within last 6 months    Recent Outpatient Visits           3 months ago Severe episode of recurrent major depressive disorder, without psychotic features ([place])   [place], LCSW       Future Appointments             In 4 weeks Elsie Stain, MD [place]              buPROPion (WELLBUTRIN XL) 150 MG 24 hr tablet [Pharmacy Med Name: BUPROPION HCL XL 150 MG TAB 150 Tablet] 30 tablet 0    Sig: TAKE 1 TABLET BY MOUTH DAILY.      Psychiatry: Antidepressants - bupropion Failed - 11/13/2019  4:13 PM      Failed - Completed PHQ-2 or PHQ-9 in the last 360 days.      Failed - Last  BP in normal range    BP Readings from Last 1 Encounters:  09/05/19 (!) 149/97          Passed - Valid encounter within last 6 months    Recent Outpatient Visits           3 months ago Severe episode of recurrent major depressive disorder, without psychotic features ([place])   [place], LCSW       Future Appointments             In 4 weeks Elsie Stain, MD [place]              metFORMIN (GLUCOPHAGE) 1000 MG tablet [Pharmacy Med Name: metFORMIN HCL 1000 MG TABS 1000 Tablet] 60 tablet 0    Sig: TAKE 1 TABLET BY MOUTH TWICE DAILY WITH A MEAL FOR DIABETES [place]  Endocrinology:  Diabetes - Biguanides Failed - 11/13/2019  4:13 PM      Failed - HBA1C is between 0 and 7.9 and within 180 days    Hemoglobin A1C  Date Value Ref Range Status  07/17/2019 13.1 (A) 4.0 - 5.6 % Final   Hgb A1c MFr Bld  Date Value Ref Range Status  07/12/2017 14.6 (H) 4.8 - 5.6 % Final    Comment:    (NOTE) Pre diabetes:          5.7%-6.4% Diabetes:              >6.4% Glycemic control for   <7.0% adults with diabetes           Passed - Cr in normal range and within 360 days    Creat  Date Value Ref Range Status  06/15/2016 0.97 0.70 - 1.33 mg/dL Final    Comment:      For patients > or = 61 years of age: The upper reference limit for Creatinine is approximately 13% higher for people identified as African-American.      Creatinine, Ser  Date Value Ref Range Status  09/05/2019 1.15 0.61 - 1.24 mg/dL Final          Passed - eGFR in normal range and within 360 days    GFR calc Af Amer  Date Value Ref Range Status  09/05/2019 >60 >60 mL/min Final   GFR calc non Af Amer  Date Value Ref Range Status  09/05/2019 >60 >60 mL/min Final          Passed - Valid encounter within last 6 months    Recent Outpatient Visits           3 months ago Severe episode of recurrent major depressive  disorder, without psychotic features [facility])   [place], [place]       Future Appointments             In 4 weeks Elsie Stain, MD [place]              carvedilol (COREG) 25 MG tablet [Pharmacy Med Name: CARVEDILOL 25 MG TABLET 25 Tablet] 60 tablet 0    Sig: TAKE 1 TABLET BY MOUTH TWICE DAILY WITH A MEAL.      Cardiovascular:  Beta Blockers Failed - 11/13/2019  4:13 PM      Failed - Last BP in normal range    BP Readings from Last 1 Encounters:  09/05/19 (!) 149/97          Passed - Last Heart Rate in normal range    Pulse Readings from Last 1 Encounters:  09/05/19 90          Passed - Valid encounter within last 6 months    Recent Outpatient Visits           3 months ago Severe episode of recurrent major depressive disorder, without psychotic features ([place])   [place], LCSW       Future Appointments             In 4 weeks Elsie Stain, MD [place]              atorvastatin (LIPITOR) 40 MG tablet [Pharmacy Med Name: ATORVASTATIN CALCIUM 40 MG 40 Tablet] 30 tablet 0    Sig: TAKE 1 TABLET BY MOUTH DAILY  FOR HIGH CHOLESTEROL      Cardiovascular:  Antilipid - Statins Failed - 11/13/2019  4:13 PM      Failed - Total Cholesterol in normal range and within 360 days    Cholesterol  Date Value Ref Range Status  01/16/2017 147 0 - 200 mg/dL Final          Failed - LDL in normal range and within 360 days    LDL Cholesterol  Date Value Ref Range Status  01/16/2017 66 0 - 99 mg/dL Final    Comment:           Total Cholesterol/HDL:CHD Risk Coronary Heart Disease Risk Table                     Men   Women  1/2 Average Risk   3.4   3.3  Average Risk       5.0   4.4  2 X Average Risk   9.6   7.1  3 X Average Risk  23.4   11.0        Use the calculated Patient Ratio above and the CHD Risk  Table to determine the patient's CHD Risk.        ATP III CLASSIFICATION (LDL):  <100     mg/dL   Optimal  100-129  mg/dL   Near or Above                    Optimal  130-159  mg/dL   Borderline  160-189  mg/dL   High  >190     mg/dL   Very High Performed at [facility] [address] [place], [place] [address]           Failed - HDL in normal range and within 360 days    HDL  Date Value Ref Range Status  01/16/2017 72 >40 mg/dL Final          Failed - Triglycerides in normal range and within 360 days    Triglycerides  Date Value Ref Range Status  01/16/2017 47 <150 mg/dL Final          Passed - Patient is not pregnant      Passed - Valid encounter within last 12 months    Recent Outpatient Visits           3 months ago Severe episode of recurrent major depressive disorder, without psychotic features [facility])   [place], [place], LCSW       Future Appointments             In 4 weeks Joya Gaskins Burnett Harry, MD [place]

## 2019-11-14 ENCOUNTER — Other Ambulatory Visit: Payer: Self-pay | Admitting: Critical Care Medicine

## 2019-11-14 MED FILL — metFORMIN HCL 1000 MG TABS: 1000 | 30 days supply | Qty: 60 | Fill #0

## 2019-11-14 MED FILL — LISINOPRIL 10 MG TABS: 10 | 30 days supply | Qty: 30 | Fill #0

## 2019-11-14 MED FILL — ATORVASTATIN CALCIUM 40 MG: 40 | 30 days supply | Qty: 30 | Fill #0

## 2019-11-14 MED FILL — BUPROPION HCL XL 150 MG TAB: 150 | 30 days supply | Qty: 30 | Fill #0

## 2019-11-14 MED FILL — CARVEDILOL 25 MG TABLET: 25 | 30 days supply | Qty: 60 | Fill #0

## 2019-11-27 ENCOUNTER — Other Ambulatory Visit: Payer: Self-pay

## 2019-12-01 ENCOUNTER — Other Ambulatory Visit: Payer: Self-pay | Admitting: Critical Care Medicine

## 2019-12-11 ENCOUNTER — Other Ambulatory Visit: Payer: Self-pay

## 2019-12-11 ENCOUNTER — Telehealth: Payer: Self-pay | Admitting: *Deleted

## 2019-12-11 ENCOUNTER — Ambulatory Visit: Payer: Self-pay | Attending: Critical Care Medicine | Admitting: Critical Care Medicine

## 2019-12-11 ENCOUNTER — Other Ambulatory Visit: Payer: Self-pay | Admitting: Critical Care Medicine

## 2019-12-11 ENCOUNTER — Encounter: Payer: Self-pay | Admitting: Critical Care Medicine

## 2019-12-11 VITALS — BP 157/75 | HR 91 | Ht 70.0 in | Wt 167.4 lb

## 2019-12-11 DIAGNOSIS — I10 Essential (primary) hypertension: Secondary | ICD-10-CM

## 2019-12-11 DIAGNOSIS — E7800 Pure hypercholesterolemia, unspecified: Secondary | ICD-10-CM

## 2019-12-11 DIAGNOSIS — F1424 Cocaine dependence with cocaine-induced mood disorder: Secondary | ICD-10-CM

## 2019-12-11 DIAGNOSIS — I5042 Chronic combined systolic (congestive) and diastolic (congestive) heart failure: Secondary | ICD-10-CM

## 2019-12-11 DIAGNOSIS — E1165 Type 2 diabetes mellitus with hyperglycemia: Secondary | ICD-10-CM

## 2019-12-11 DIAGNOSIS — R45851 Suicidal ideations: Secondary | ICD-10-CM

## 2019-12-11 DIAGNOSIS — Z1211 Encounter for screening for malignant neoplasm of colon: Secondary | ICD-10-CM

## 2019-12-11 DIAGNOSIS — Z1159 Encounter for screening for other viral diseases: Secondary | ICD-10-CM

## 2019-12-11 DIAGNOSIS — Z720 Tobacco use: Secondary | ICD-10-CM

## 2019-12-11 DIAGNOSIS — F332 Major depressive disorder, recurrent severe without psychotic features: Secondary | ICD-10-CM

## 2019-12-11 DIAGNOSIS — Z23 Encounter for immunization: Secondary | ICD-10-CM

## 2019-12-11 DIAGNOSIS — I429 Cardiomyopathy, unspecified: Secondary | ICD-10-CM

## 2019-12-11 DIAGNOSIS — Z87891 Personal history of nicotine dependence: Secondary | ICD-10-CM | POA: Insufficient documentation

## 2019-12-11 DIAGNOSIS — F1010 Alcohol abuse, uncomplicated: Secondary | ICD-10-CM

## 2019-12-11 DIAGNOSIS — F1410 Cocaine abuse, uncomplicated: Secondary | ICD-10-CM

## 2019-12-11 LAB — GLUCOSE, POCT (MANUAL RESULT ENTRY): POC Glucose: 293 mg/dl — AB (ref 70–99)

## 2019-12-11 LAB — POCT GLYCOSYLATED HEMOGLOBIN (HGB A1C): HbA1c, POC (controlled diabetic range): 11.7 % — AB (ref 0.0–7.0)

## 2019-12-11 MED ORDER — LISINOPRIL 10 MG PO TABS: 10.0000 mg | ORAL_TABLET | Freq: Every day | ORAL | 1 refills | Status: DC

## 2019-12-11 MED ORDER — "PEN NEEDLES 1/2"" 29G X 12MM MISC": 1 refills | Status: DC

## 2019-12-11 MED ORDER — SILDENAFIL CITRATE 100 MG PO TABS: 50.0000 mg | ORAL_TABLET | Freq: Every day | ORAL | 11 refills | Status: DC | PRN

## 2019-12-11 MED ORDER — TADALAFIL 5 MG PO TABS: 5.0000 mg | ORAL_TABLET | Freq: Every day | ORAL | 0 refills | Status: DC | PRN

## 2019-12-11 MED ORDER — CARVEDILOL 25 MG PO TABS: 25.0000 mg | ORAL_TABLET | Freq: Two times a day (BID) | ORAL | 1 refills | Status: DC

## 2019-12-11 MED ORDER — TRUE METRIX METER W/DEVICE KIT
PACK | 0 refills | Status: DC
Start: 2019-12-11 — End: 2021-04-24

## 2019-12-11 MED ORDER — TRUE METRIX BLOOD GLUCOSE TEST VI STRP
ORAL_STRIP | 12 refills | Status: DC
Start: 2019-12-11 — End: 2020-07-29

## 2019-12-11 MED ORDER — TRUEPLUS LANCETS 28G MISC
1 refills | Status: DC
Start: 2019-12-11 — End: 2020-07-29

## 2019-12-11 MED ORDER — ATORVASTATIN CALCIUM 40 MG PO TABS: ORAL_TABLET | ORAL | 1 refills | Status: DC

## 2019-12-11 MED ORDER — METFORMIN HCL 1000 MG PO TABS: ORAL_TABLET | ORAL | 0 refills | Status: DC

## 2019-12-11 MED ORDER — BUPROPION HCL ER (XL) 150 MG PO TB24: 150.0000 mg | ORAL_TABLET | Freq: Every day | ORAL | 1 refills | Status: DC

## 2019-12-11 MED ORDER — "INSULIN SYRINGE 27G X 1/2"" 0.5 ML MISC": 1 refills | Status: DC

## 2019-12-11 MED ORDER — INSULIN GLARGINE 100 UNIT/ML ~~LOC~~ SOLN: 15.0000 [IU] | Freq: Every day | SUBCUTANEOUS | 1 refills | Status: DC

## 2019-12-11 MED FILL — CARVEDILOL 25 MG TABLET: 25 | 30 days supply | Qty: 60 | Fill #0

## 2019-12-11 MED FILL — UNIFINE PENTIPS 32GX5/32: 32G X 4 MM | 30 days supply | Qty: 100 | Fill #0

## 2019-12-11 MED FILL — metFORMIN HCL 1000 MG TABS: 1000 | 30 days supply | Qty: 60 | Fill #0

## 2019-12-11 MED FILL — TRUEplus LANCETS 28G MISC: 30 days supply | Qty: 100 | Fill #0

## 2019-12-11 MED FILL — ATORVASTATIN 40 MG TABLET: 40 | 30 days supply | Qty: 30 | Fill #0

## 2019-12-11 MED FILL — TRUE METRIX BLOOD GLUCOSE M: W/DEVICE | 1 days supply | Qty: 1 | Fill #0

## 2019-12-11 MED FILL — LISINOPRIL 10 MG TABS: 10 | 30 days supply | Qty: 30 | Fill #0

## 2019-12-11 MED FILL — TRUE METRIX TEST STRIP: 25 days supply | Qty: 50 | Fill #0

## 2019-12-11 MED FILL — TADALAFIL 5 MG TABS: 5 | 10 days supply | Qty: 10 | Fill #0

## 2019-12-11 MED FILL — LANTUS SOLOSTAR 100 UNITS/M: 100 | 20 days supply | Qty: 3 | Fill #0

## 2019-12-11 MED FILL — buPROPion HCL ER (XL) 150 M: 150 | 30 days supply | Qty: 30 | Fill #0

## 2019-12-11 NOTE — Progress Notes (Signed)
Subjective:    Patient ID: James [name], male    DOB: [birth year], 59 y.o.   MRN: [id number]  12/11/2019 This is a 59 year old male who comes to [facility] wishing to establish for primary care.  He currently is uninsured and now has housing.  Formally he was homeless.  He formally was being seen by Raechel Chute in the [facility] but now that he has housing he is seeking primary care here at the health and wellness center.  Patient has a prior history of hypertension, type 2 diabetes, nonischemic cardiomyopathy, hyperlipidemia, chronic systolic diastolic heart failure, major depression, use of cocaine chronically, history of suicidal ideation.  The patient has been in and out of the criminal justice system as well more recently in May of this year.  At that time while on insulin he felt he had hypoglycemic spells in the jail area.  He says his last cocaine was in PCP to establish needs med refill  Hx HTN, T2DM, NICM, HLD, Chronic systolic/diastolic HF, Major Depression, Hx cocaine use, hx of suicidal ideation  Patient states he is experiencing continued anxiety and depression.  He is requesting an outpatient program from substance use disorder.  He does have a job working in [employer].  The patient did receive his Covid vaccine series ending in May.  He has yet to receive influenza vaccine and is due a tetanus vaccine.  On arrival blood pressure is 157/75.  The patient states he is not been on any of his medications in several weeks as he ran out of his medicines including his insulin and blood pressure heart failure medicines  The patient does not have any any pending appointments with cardiology.  Previously the [facility] and was now referred here to primary care for to establish.  On arrival blood sugar 293 and hemoglobin A1c was 11.9.  He is requesting a prescription for erectile dysfunction Viagra.  He does have very poor dentition.   He is no longer drinking alcohol.  Past Medical History:  Diagnosis Date  . Cellulitis and abscess of foot 09/29/2016  . Chronic systolic heart failure ([place])   . Depression   . Diabetes mellitus without complication ([place])   . Essential hypertension   . NICM (nonischemic cardiomyopathy) ([place]) EF 20-25%   . Suicidal ideations 07/12/2017     Family History  Problem Relation Age of Onset  . Diabetes Other   . Hypertension Other   . Diabetes Mother   . Brain cancer Father   . Mental illness Brother   . Drug abuse Brother   . Diabetes Brother      Social History   Socioeconomic History  . Marital status: Married    Spouse name: Not on file  . Number of children: Not on file  . Years of education: Not on file  . Highest education level: Not on file  Occupational History  . Not on file  Tobacco Use  . Smoking status: Current Every Day Smoker    Packs/day: 0.50    Years: 23.00    Pack years: 11.50    Types: Cigarettes  . Smokeless tobacco: Never Used  . Tobacco comment: Every other day   Vaping Use  . Vaping Use: Never used  Substance and Sexual Activity  . Alcohol use: Yes  . Drug use: Yes    Types: Cocaine, "Crack" cocaine  . Sexual activity: Not Currently  Other Topics Concern  .  Not on file  Social History Narrative  . Not on file   Social Determinants of Health   Financial Resource Strain:   . Difficulty of Paying Living Expenses: Not on file  Food Insecurity:   . Worried About Charity fundraiser in the Last Year: Not on file  . Ran Out of Food in the Last Year: Not on file  Transportation Needs:   . Lack of Transportation (Medical): Not on file  . Lack of Transportation (Non-Medical): Not on file  Physical Activity:   . Days of Exercise per Week: Not on file  . Minutes of Exercise per Session: Not on file  Stress:   . Feeling of Stress : Not on file  Social Connections:   . Frequency of Communication with Friends and Family: Not on file  . Frequency  of Social Gatherings with Friends and Family: Not on file  . Attends Religious Services: Not on file  . Active Member of Clubs or Organizations: Not on file  . Attends Archivist Meetings: Not on file  . Marital Status: Not on file  Intimate Partner Violence:   . Fear of Current or Ex-Partner: Not on file  . Emotionally Abused: Not on file  . Physically Abused: Not on file  . Sexually Abused: Not on file     No Known Allergies   Outpatient Medications Prior to Visit  Medication Sig Dispense Refill  . blood glucose meter kit and supplies Dispense based on patient and insurance preference. Use up to four times daily as directed. (FOR ICD-9 250.00, 250.01). 1 each 0  . atorvastatin (LIPITOR) 40 MG tablet TAKE 1 TABLET BY MOUTH DAILY FOR HIGH CHOLESTEROL 30 tablet 0  . buPROPion (WELLBUTRIN XL) 150 MG 24 hr tablet TAKE 1 TABLET BY MOUTH DAILY. 30 tablet 0  . carvedilol (COREG) 25 MG tablet TAKE 1 TABLET BY MOUTH TWICE DAILY WITH A MEAL. 60 tablet 0  . insulin glargine (LANTUS) 100 UNIT/ML injection Inject 0.15 mLs (15 Units total) into the skin at bedtime. For diabetes management 5 mL 0  . lisinopril (ZESTRIL) 10 MG tablet TAKE 1 TABLET BY MOUTH DAILY. 30 tablet 0  . metFORMIN (GLUCOPHAGE) 1000 MG tablet TAKE 1 TABLET BY MOUTH TWICE DAILY WITH A MEAL FOR DIABETES MANAGMEMENT 60 tablet 0   No facility-administered medications prior to visit.       Review of Systems Constitutional:   No  weight loss, night sweats,  Fevers, chills, fatigue, lassitude. HEENT:   No headaches,  Difficulty swallowing,  Tooth/dental problems,  Sore throat,                No sneezing, itching, ear ache, nasal congestion, post nasal drip,   CV:  No chest pain,  Orthopnea, PND, swelling in lower extremities, anasarca, dizziness, palpitations  GI  No heartburn, indigestion, abdominal pain, nausea, vomiting, diarrhea, change in bowel habits, loss of appetite  Resp: No shortness of breath with exertion  or at rest.  No excess mucus, no productive cough,  No non-productive cough,  No coughing up of blood.  No change in color of mucus.  No wheezing.  No chest wall deformity  Skin: no rash or lesions.  GU: no dysuria, change in color of urine, no urgency or frequency.  No flank pain.  MS:  No joint pain or swelling.  No decreased range of motion.  No back pain.  Psych:   change in mood or affect. No depression or anxiety.  No memory loss.     Objective:   Physical Exam Vitals:   12/11/19 1011  BP: (!) 157/75  Pulse: 91  SpO2: 100%  Weight: 167 lb 6.4 oz (75.9 kg)  Height: _0  (1.778 m)    Gen: Pleasant, well-nourished, in no distress,  normal affect  ENT: No lesions,  mouth clear,  oropharynx clear, no postnasal drip  Neck: No JVD, no TMG, no carotid bruits  Lungs: No use of accessory muscles, no dullness to percussion, clear without rales or rhonchi  Cardiovascular: RRR, heart sounds normal, no murmur or gallops, no peripheral edema  Abdomen: soft and NT, no HSM,  BS normal  Musculoskeletal: No deformities, no cyanosis or clubbing  Neuro: alert, non focal  Skin: Warm, no lesions or rashes  No recent labs in the system       Assessment & Plan:  I personally reviewed all images and lab data in the [facility] system as well as any outside material available during this office visit and agree with the  radiology impressions.   Essential hypertension Hypertension poorly controlled will resume antihypertensive meds  Chronic combined systolic and diastolic heart failure (HCC) Due to nonischemic cardiomyopathy unknown cause  Refer back to cardiology for further evaluation note last ejection fraction was 25%  Cardiomyopathy- suspect NICM but etiology not yet determined As per heart failure assessment  Alcohol use disorder, mild, abuse Prior history of ethanol use currently patient states he is abstinent  Cocaine abuse ([place]) History of recurrent cocaine use with  associated mood disorder  Referral to outpatient counseling will be made through our behavioral health therapist  Cocaine dependence with cocaine-induced mood disorder ([place]) Will refill Wellbutrin 150 mg daily  HYPERCHOLESTEROLEMIA Refill lipid therapy  Suicidal ideations No evidence of active suicidal ideation at this time  MDD (major depressive disorder) As per cocaine induced mood disorder  Uncontrolled type 2 diabetes mellitus with hyperglycemia ([place]) Uncontrolled diabetes type 2 with hyperglycemia and long-term insulin use  Plan is to refill Lantus at a dose of 15 units daily he will be given insulin pens syringes and needles Patient is also to resume Metformin to 1000 mg twice daily  Patient will return in short-term follow-up   Tobacco use    . Current smoking consumption amount: 1 pack a week  . Dicsussion on advise to quit smoking and smoking impacts: Cardiovascular impacts  . Patient's willingness to quit: Not ready to quit  . Methods to quit smoking discussed: Not discussed  . Medication management of smoking session drugs discussed: Not discussed  . Resources provided:  AVS   . Setting quit date not yet obtained  . Follow-up arranged short-term follow-up   Time spent counseling the patient: 5 minutes    [place] was seen today for diabetes.  Diagnoses and all orders for this visit:  Uncontrolled type 2 diabetes mellitus with hyperglycemia (HCC) -     POCT glucose (manual entry) -     POCT glycosylated hemoglobin (Hb A1C) -     metFORMIN (GLUCOPHAGE) 1000 MG tablet; TAKE 1 TABLET BY MOUTH TWICE DAILY WITH A MEAL FOR DIABETES MANAGMEMENT -     insulin glargine (LANTUS) 100 UNIT/ML injection; Inject 0.15 mLs (15 Units total) into the skin at bedtime. For diabetes management -     Comprehensive metabolic panel -     CBC with Differential/Platelet -     Cancel: Hemoglobin A1c  HYPERCHOLESTEROLEMIA -     atorvastatin (LIPITOR) 40 MG tablet; TAKE 1  TABLET BY MOUTH DAILY FOR HIGH CHOLESTEROL  Severe episode of recurrent major depressive disorder, without psychotic features (HCC) -     buPROPion (WELLBUTRIN XL) 150 MG 24 hr tablet; Take 1 tablet (150 mg total) by mouth daily.  Essential hypertension -     carvedilol (COREG) 25 MG tablet; Take 1 tablet (25 mg total) by mouth 2 (two) times daily with a meal. -     lisinopril (ZESTRIL) 10 MG tablet; Take 1 tablet (10 mg total) by mouth daily.  Need for hepatitis C screening test -     HCV Ab w/Rflx to Verification  Colon cancer screening -     Fecal occult blood, imunochemical  Cardiomyopathy, unspecified type ([place]) -     Ambulatory referral to Cardiology  Need for immunization against influenza -     Flu Vaccine QUAD 36+ mos IM  Controlled type 2 diabetes mellitus with hyperglycemia, with long-term current use of insulin (HCC)  Chronic combined systolic and diastolic heart failure (HCC)  Alcohol use disorder, mild, abuse  Cocaine abuse (HCC)  Cocaine dependence with cocaine-induced mood disorder ([place])  Suicidal ideations  Tobacco use  Other orders -     Blood Glucose Monitoring Suppl (TRUE METRIX METER) w/Device KIT; Use to measure blood sugar twice a day -     TRUEplus Lancets 28G MISC; Use to measure blood sugar twice a day -     glucose blood (TRUE METRIX BLOOD GLUCOSE TEST) test strip; Use as instructed -   -     Discontinue: Insulin Pen Needle (PEN NEEDLES 29GX1/2") 29G X 12MM MISC; Use with lantus -     I  Tetanus and flu vaccine given at this visit we will check hepatitis C assay fecal occult for colon cancer screening  All medications refilled  Prescribing the Abrol for erectile dysfunction

## 2019-12-11 NOTE — Assessment & Plan Note (Signed)
As per heart failure assessment 

## 2019-12-11 NOTE — Telephone Encounter (Signed)
Med change sent to [facility]

## 2019-12-11 NOTE — Telephone Encounter (Signed)
Patient requesting aid from Dr. Delford Field. Mentioned there was a mistake with his prescription. Pt needed to return to work but asked to be called at [phone]. Please advise

## 2019-12-11 NOTE — Telephone Encounter (Signed)
Patient called and states that he would like to get Viagra instead of Cialis. He would like the medication to be sent to [facility] on file.  Patient understands that he will have to pay out of pocket.

## 2019-12-11 NOTE — Assessment & Plan Note (Signed)
History of recurrent cocaine use with associated mood disorder  Referral to outpatient counseling will be made through our behavioral health therapist

## 2019-12-11 NOTE — Assessment & Plan Note (Signed)
No evidence of active suicidal ideation at this time

## 2019-12-11 NOTE — Assessment & Plan Note (Signed)
Due to nonischemic cardiomyopathy unknown cause  Refer back to cardiology for further evaluation note last ejection fraction was 25%

## 2019-12-11 NOTE — Assessment & Plan Note (Signed)
As per cocaine induced mood disorder

## 2019-12-11 NOTE — Assessment & Plan Note (Signed)
Hypertension poorly controlled will resume antihypertensive meds

## 2019-12-11 NOTE — Assessment & Plan Note (Signed)
Refill lipid therapy 

## 2019-12-11 NOTE — Assessment & Plan Note (Signed)
Will refill Wellbutrin 150 mg daily

## 2019-12-11 NOTE — Assessment & Plan Note (Deleted)
Uncontrolled diabetes type 2 with hyperglycemia and long-term insulin use  Plan is to refill Lantus at a dose of 15 units daily he will be given insulin pens syringes and needles Patient is also to resume Metformin to 1000 mg twice daily  Patient will return in short-term follow-up

## 2019-12-11 NOTE — Assessment & Plan Note (Signed)
  .   Current smoking consumption amount: 1 pack a week  . Dicsussion on advise to quit smoking and smoking impacts: Cardiovascular impacts  . Patient's willingness to quit: Not ready to quit  . Methods to quit smoking discussed: Not discussed  . Medication management of smoking session drugs discussed: Not discussed  . Resources provided:  AVS   . Setting quit date not yet obtained  . Follow-up arranged short-term follow-up   Time spent counseling the patient: 5 minutes

## 2019-12-11 NOTE — Progress Notes (Signed)
Would like refill on Viagra sent to [facility] on file.

## 2019-12-11 NOTE — Assessment & Plan Note (Signed)
Uncontrolled diabetes type 2 with hyperglycemia and long-term insulin use  Plan is to refill Lantus at a dose of 15 units daily he will be given insulin pens syringes and needles Patient is also to resume Metformin to 1000 mg twice daily  Patient will return in short-term follow-up 

## 2019-12-11 NOTE — Assessment & Plan Note (Signed)
Prior history of ethanol use currently patient states he is abstinent

## 2019-12-11 NOTE — Patient Instructions (Addendum)
You will be connected with James [name] See our behavioral health counselor  All of your medications went to [facility] for financial assistance  cialis sent for erectile dysfunction likely from your diabetes  You need your teeth evaluated, we can do that when you get the orange card with financial assistance  Labs today to reassess  A fecal occult kit given to check for blood in stool  Cardiology referral made  Return Dr Joya Gaskins in 6 weeks   Tobacco Use Disorder Tobacco use disorder (TUD) occurs when a person craves, seeks, and uses tobacco, regardless of the consequences. This disorder can cause problems with mental and physical health. It can affect your ability to have healthy relationships, and it can keep you from meeting your responsibilities at work, home, or school. Tobacco may be:  Smoked as a cigarette or cigar.  Inhaled using e-cigarettes.  Smoked in a pipe or hookah.  Chewed as smokeless tobacco.  Inhaled into the nostrils as snuff. Tobacco products contain a dangerous chemical called nicotine, which is very addictive. Nicotine triggers hormones that make the body feel stimulated and works on areas of the brain that make you feel good. These effects can make it hard for people to quit nicotine. Tobacco contains many other unsafe chemicals that can damage almost every organ in the body. Smoking tobacco also puts others in danger due to fire risk and possible health problems caused by breathing in secondhand smoke. What are the signs or symptoms? Symptoms of TUD may include:  Being unable to slow down or stop your tobacco use.  Spending an abnormal amount of time getting or using tobacco.  Craving tobacco. Cravings may last for up to 6 months after quitting.  Tobacco use that: ? Interferes with your work, school, or home life. ? Interferes with your personal and social relationships. ? Makes you give up activities that you once enjoyed  or found important.  Using tobacco even though you know that it is: ? Dangerous or bad for your health or someone else's health. ? Causing problems in your life.  Needing more and more of the substance to get the same effect (developing tolerance).  Experiencing unpleasant symptoms if you do not use the substance (withdrawal). Withdrawal symptoms may include: ? Depressed, anxious, or irritable mood. ? Difficulty concentrating. ? Increased appetite. ? Restlessness or trouble sleeping.  Using the substance to avoid withdrawal. How is this diagnosed? This condition may be diagnosed based on:  Your current and past tobacco use. Your health care provider may ask questions about how your tobacco use affects your life.  A physical exam. You may be diagnosed with TUD if you have at least two symptoms within a 1-monthperiod. How is this treated? This condition is treated by stopping tobacco use. Many people are unable to quit on their own and need help. Treatment may include:  Nicotine replacement therapy (NRT). NRT provides nicotine without the other harmful chemicals in tobacco. NRT gradually lowers the dosage of nicotine in the body and reduces withdrawal symptoms. NRT is available as: ? Over-the-counter gums, lozenges, and skin patches. ? Prescription mouth inhalers and nasal sprays.  Medicine that acts on the brain to reduce cravings and withdrawal symptoms.  A type of talk therapy that examines your triggers for tobacco use, how to avoid them, and how to cope with cravings (behavioral therapy).  Hypnosis. This may help with withdrawal symptoms.  Joining a support group for others coping with TUD. The  best treatment for TUD is usually a combination of medicine, talk therapy, and support groups. Recovery can be a long process. Many people start using tobacco again after stopping (relapse). If you relapse, it does not mean that treatment will not work. Follow these instructions at  home:  Lifestyle  Do not use any products that contain nicotine or tobacco, such as cigarettes and e-cigarettes.  Avoid things that trigger tobacco use as much as you can. Triggers include people and situations that usually cause you to use tobacco.  Avoid drinks that contain caffeine, including coffee. These may worsen some withdrawal symptoms.  Find ways to manage stress. Wanting to smoke may cause stress, and stress can make you want to smoke. Relaxation techniques such as deep breathing, meditation, and yoga may help.  Attend support groups as needed. These groups are an important part of long-term recovery for many people. General instructions  Take over-the-counter and prescription medicines only as told by your health care provider.  Check with your health care provider before taking any new prescription or over-the-counter medicines.  Decide on a friend, family member, or smoking quit-line (such as 1-800-QUIT-NOW in the [place] that you can call or text when you feel the urge to smoke or when you need help coping with cravings.  Keep all follow-up visits as told by your health care provider and therapist. This is important. Contact a health care provider if:  You are not able to take your medicines as prescribed.  Your symptoms get worse, even with treatment. Summary  Tobacco use disorder (TUD) occurs when a person craves, seeks, and uses tobacco regardless of the consequences.  This condition may be diagnosed based on your current and past tobacco use and a physical exam.  Many people are unable to quit on their own and need help. Recovery can be a long process.  The most effective treatment for TUD is usually a combination of medicine, talk therapy, and support groups. This information is not intended to replace advice given to you by your health care provider. Make sure you discuss any questions you have with your health care provider. Document Revised: 03/16/2017  Document Reviewed: 03/16/2017 Elsevier Patient Education  2020 [employer].

## 2019-12-12 LAB — COMPREHENSIVE METABOLIC PANEL
ALT: 38 IU/L (ref 0–44)
AST: 22 IU/L (ref 0–40)
Albumin/Globulin Ratio: 1.8 (ref 1.2–2.2)
Albumin: 4.4 g/dL (ref 3.8–4.8)
Alkaline Phosphatase: 105 IU/L (ref 48–121)
BUN/Creatinine Ratio: 14 (ref 10–24)
BUN: 13 mg/dL (ref 8–27)
Bilirubin Total: 0.3 mg/dL (ref 0.0–1.2)
CO2: 23 mmol/L (ref 20–29)
Calcium: 9.9 mg/dL (ref 8.6–10.2)
Chloride: 102 mmol/L (ref 96–106)
Creatinine, Ser: 0.91 mg/dL (ref 0.76–1.27)
GFR calc Af Amer: 105 mL/min/{1.73_m2} (ref >59)
GFR calc non Af Amer: 91 mL/min/{1.73_m2} (ref >59)
Globulin, Total: 2.4 g/dL (ref 1.5–4.5)
Glucose: 286 mg/dL — ABNORMAL HIGH (ref 65–99)
Potassium: 4.4 mmol/L (ref 3.5–5.2)
Sodium: 139 mmol/L (ref 134–144)
Total Protein: 6.8 g/dL (ref 6.0–8.5)

## 2019-12-12 LAB — CBC WITH DIFFERENTIAL/PLATELET
Basophils Absolute: 0 10*3/uL (ref 0.0–0.2)
Basos: 1 %
EOS (ABSOLUTE): 0.2 10*3/uL (ref 0.0–0.4)
Eos: 6 %
Hematocrit: 37.3 % — ABNORMAL LOW (ref 37.5–51.0)
Hemoglobin: 12.7 g/dL — ABNORMAL LOW (ref 13.0–17.7)
Immature Grans (Abs): 0 10*3/uL (ref 0.0–0.1)
Immature Granulocytes: 0 %
Lymphocytes Absolute: 1.4 10*3/uL (ref 0.7–3.1)
Lymphs: 37 %
MCH: 29.5 pg (ref 26.6–33.0)
MCHC: 34 g/dL (ref 31.5–35.7)
MCV: 87 fL (ref 79–97)
Monocytes Absolute: 0.3 10*3/uL (ref 0.1–0.9)
Monocytes: 8 %
Neutrophils Absolute: 1.9 10*3/uL (ref 1.4–7.0)
Neutrophils: 48 %
Platelets: 240 10*3/uL (ref 150–450)
RBC: 4.3 x10E6/uL (ref 4.14–5.80)
RDW: 12.5 % (ref 11.6–15.4)
WBC: 4 10*3/uL (ref 3.4–10.8)

## 2019-12-12 LAB — HCV INTERPRETATION

## 2019-12-12 LAB — HCV AB W/RFLX TO VERIFICATION: HCV Ab: <0.1 s/co ratio (ref 0.0–0.9)

## 2019-12-14 ENCOUNTER — Other Ambulatory Visit: Payer: Self-pay

## 2019-12-14 ENCOUNTER — Ambulatory Visit (HOSPITAL_COMMUNITY): Payer: No Typology Code available for payment source | Admitting: Behavioral Health

## 2019-12-24 ENCOUNTER — Ambulatory Visit (INDEPENDENT_AMBULATORY_CARE_PROVIDER_SITE_OTHER): Payer: No Payment, Other | Admitting: Behavioral Health

## 2019-12-24 ENCOUNTER — Other Ambulatory Visit: Payer: Self-pay

## 2019-12-24 DIAGNOSIS — F1420 Cocaine dependence, uncomplicated: Secondary | ICD-10-CM

## 2019-12-24 NOTE — Group Note (Signed)
Group Topic: Relapse Prevention  Group Date: 12/24/2019 Start Time:  9:00 AM End Time: 12:00 PM Facilitators: Mamie Nick, Counselor  Department: [facility]  Number of Participants: 3  Group Focus: affirmation, anxiety, chemical dependency education, chemical dependency issues, clarity of thought, coping skills, depression, family, feeling awareness/expression, forgiveness, healthy friendships, loss/grief issues, and relapse prevention Treatment Modality:  Cognitive Behavioral Therapy and Psychoeducation Interventions utilized were confrontation, group exercise, and patient education Purpose: enhance coping skills, explore maladaptive thinking, express feelings, increase insight, regain self-worth, relapse prevention strategies, and trigger / craving management   Name: James [name] Date of Birth: 23-Feb-1961  MR: [id number]    Level of Participation: active Quality of Participation: attentive, cooperative and offered feedback Interactions with others: gave feedback Mood/Affect: appropriate and positive Triggers (if applicable): NA Cognition: coherent/clear Progress: Moderate Response: James [name] engaged well in group discussion in regards to Post-Acute Withdrawal Syndrome. James [name] shared that he had a good weekend with his family. James [name] talked about his grief around losing his mother. He shared that he remained clean for 25 months after the death of his mother. He stated "working and family" is what helped to keep him clean from cocaine and alcohol for those 25 months. He reported he relapsed when he lost his brother and niece due to COVID. James [name] was able to connect his addiction to his inability to cope with grief and loss. James [name] provided insightful verbal feedback to the group discussion. Plan: follow-up needed  Patients Problems:  Patient Active Problem List   Diagnosis Date Noted  . Tobacco use 12/11/2019  . Cocaine dependence with  cocaine-induced mood disorder (HCC)   . Cocaine abuse (HCC)   . MDD (major depressive disorder) 01/14/2017  . Cardiomyopathy- suspect NICM but etiology not yet determined 04/27/2016  . Chronic combined systolic and diastolic heart failure (HCC) 04/27/2016  . Uncontrolled type 2 diabetes mellitus with hyperglycemia (HCC) 04/26/2016  . Alcohol use disorder, mild, abuse 12/12/2015  . SPONDYLOSIS, CERVICAL, WITH RADICULOPATHY 02/25/2009  . HYPERCHOLESTEROLEMIA 05/09/2007  . ALLERGIC RHINITIS 12/21/2006  . Essential hypertension 12/07/2006     Family Program: Family present? NA   Name of family member(s): NA  UDS collected: No Results: NA  AA/NA attended?: No  Sponsor?: No

## 2019-12-26 ENCOUNTER — Other Ambulatory Visit: Payer: Self-pay

## 2019-12-26 ENCOUNTER — Ambulatory Visit (INDEPENDENT_AMBULATORY_CARE_PROVIDER_SITE_OTHER): Payer: No Payment, Other | Admitting: Behavioral Health

## 2019-12-26 DIAGNOSIS — F1420 Cocaine dependence, uncomplicated: Secondary | ICD-10-CM | POA: Diagnosis not present

## 2019-12-26 NOTE — Group Note (Signed)
Group Topic: Guilt/Shame  Group Date: 12/26/2019 Start Time:  9:00 AM End Time: 12:00 PM Facilitators: Mamie Nick, Counselor  Department: [facility]  Number of Participants: 2  Group Focus: acceptance, affirmation, co-dependency, coping skills, and family Treatment Modality:  Cognitive Behavioral Therapy Interventions utilized were clarification and exploration Purpose: enhance coping skills and express feelings   Name: James [name] Date of Birth: 05-Nov-1960  MR: [id number]    Level of Participation: active Quality of Participation: attentive and cooperative Interactions with others: gave feedback Mood/Affect: appropriate Triggers (if applicable): NA Cognition: coherent/clear Progress: Moderate Response: James [name] was attentive and cooperative during group. He also provided helpful feedback as he talked about his addiction and how it has damaged his relationship with his family. James [name] talked about the shame/guilt he had during his last relapse. He shared that he feared losing his wife, children, and grandchildren if he did not get clean. He also reported recently seeing an old friend that he used to get high with during his days of active addiction. He was receptive to the information talked about during group and states he is thankful he has this group to attend. Plan: follow-up needed  Patients Problems:  Patient Active Problem List   Diagnosis Date Noted  . Tobacco use 12/11/2019  . Cocaine dependence with cocaine-induced mood disorder (HCC)   . Cocaine abuse (HCC)   . MDD (major depressive disorder) 01/14/2017  . Cardiomyopathy- suspect NICM but etiology not yet determined 04/27/2016  . Chronic combined systolic and diastolic heart failure (HCC) 04/27/2016  . Uncontrolled type 2 diabetes mellitus with hyperglycemia (HCC) 04/26/2016  . Alcohol use disorder, mild, abuse 12/12/2015  . SPONDYLOSIS, CERVICAL, WITH RADICULOPATHY 02/25/2009  .  HYPERCHOLESTEROLEMIA 05/09/2007  . ALLERGIC RHINITIS 12/21/2006  . Essential hypertension 12/07/2006     Family Program: Family present? No   Name of family member(s): NA  UDS collected: No Results: NA  AA/NA attended?: No  Sponsor?: No

## 2019-12-27 ENCOUNTER — Other Ambulatory Visit: Payer: Self-pay | Admitting: Critical Care Medicine

## 2019-12-27 ENCOUNTER — Ambulatory Visit: Payer: Self-pay | Attending: Critical Care Medicine | Admitting: Critical Care Medicine

## 2019-12-27 ENCOUNTER — Encounter: Payer: Self-pay | Admitting: Critical Care Medicine

## 2019-12-27 DIAGNOSIS — F1424 Cocaine dependence with cocaine-induced mood disorder: Secondary | ICD-10-CM

## 2019-12-27 DIAGNOSIS — I10 Essential (primary) hypertension: Secondary | ICD-10-CM

## 2019-12-27 DIAGNOSIS — E1165 Type 2 diabetes mellitus with hyperglycemia: Secondary | ICD-10-CM

## 2019-12-27 DIAGNOSIS — I429 Cardiomyopathy, unspecified: Secondary | ICD-10-CM

## 2019-12-27 DIAGNOSIS — Z720 Tobacco use: Secondary | ICD-10-CM

## 2019-12-27 MED ORDER — INSULIN GLARGINE 100 UNIT/ML ~~LOC~~ SOLN: 20.0000 [IU] | Freq: Every day | SUBCUTANEOUS | 1 refills | Status: DC

## 2019-12-27 MED ORDER — EMPAGLIFLOZIN 10 MG PO TABS: 10.0000 mg | ORAL_TABLET | Freq: Every day | ORAL | 1 refills | Status: DC

## 2019-12-27 MED ORDER — SILDENAFIL CITRATE 100 MG PO TABS: 50.0000 mg | ORAL_TABLET | Freq: Every day | ORAL | 11 refills | Status: DC | PRN

## 2019-12-27 MED FILL — JARDIANCE 10 MG TABLET: 10 | 30 days supply | Qty: 30 | Fill #0

## 2019-12-27 MED FILL — SILDENAFIL CITRATE 100 MG T: 100 | 30 days supply | Qty: 10 | Fill #0

## 2019-12-27 NOTE — Assessment & Plan Note (Signed)
Hypertension stable this time no change in medications 

## 2019-12-27 NOTE — Assessment & Plan Note (Signed)
Patient is no longer drinking alcohol or using cocaine and will continue Wellbutrin and intensive outpatient therapy

## 2019-12-27 NOTE — Progress Notes (Signed)
Subjective:    Patient ID: James [name], male    DOB: 02-12-1961, 59 y.o.   MRN: [id number] Virtual Visit via Telephone Note  I connected with James [name] on 12/27/19 at 11:00 AM EDT by telephone and verified that I am speaking with the correct person using two identifiers.   Consent:  I discussed the limitations, risks, security and privacy concerns of performing an evaluation and management service by telephone and the availability of in person appointments. I also discussed with the patient that there may be a patient responsible charge related to this service. The patient expressed understanding and agreed to proceed.  Location of patient: Patient was at home  Location of provider: I was in office  Persons participating in the televisit with the patient.    No one else on the call  History of Present Illness: 12/11/2019 This is a 59 year old male who comes to [facility] wellness wishing to establish for primary care.  He currently is uninsured and now has housing.  Formally he was homeless.  He formally was being seen by Raechel Chute in the [facility] but now that he has housing he is seeking primary care here at the health and wellness center.  Patient has a prior history of hypertension, type 2 diabetes, nonischemic cardiomyopathy, hyperlipidemia, chronic systolic diastolic heart failure, major depression, use of cocaine chronically, history of suicidal ideation.  The patient has been in and out of the criminal justice system as well more recently in May of this year.  At that time while on insulin he felt he had hypoglycemic spells in the jail area.  He says his last cocaine was in PCP to establish needs med refill  Hx HTN, T2DM, NICM, HLD, Chronic systolic/diastolic HF, Major Depression, Hx cocaine use, hx of suicidal ideation  Patient states he is experiencing continued anxiety and depression.  He is requesting an outpatient program from substance use disorder.  He  does have a job working in [employer].  The patient did receive his Covid vaccine series ending in May.  He has yet to receive influenza vaccine and is due a tetanus vaccine.  On arrival blood pressure is 157/75.  The patient states he is not been on any of his medications in several weeks as he ran out of his medicines including his insulin and blood pressure heart failure medicines  The patient does not have any any pending appointments with cardiology.  Previously the patient was seen in [facility] at the [facility] and was now referred here to primary care for to establish.  On arrival blood sugar 293 and hemoglobin A1c was 11.9.  He is requesting a prescription for erectile dysfunction Viagra.  He does have very poor dentition.  He is no longer drinking alcohol.  12/27/2019 This is a telephone follow-up visit for this 59 year old male with history of cocaine use, cardiomyopathy, alcohol use, depression, hypercholesterolemia, type 2 diabetes, major depression, history of suicidal ideation and anxiety.  This patient is reached to by phone today and he states he is markedly improved.  His no longer having any chest pain shortness of breath or edema.  He is reduced his tobacco intake to about 1/2 pack a day.  He is no longer taking cocaine and he is in now enrolled in an intensive outpatient program for cocaine use.  He states his blood sugars in the afternoons are 1 68-1 78 mornings are in the 200s his lowest is 127.  He is  on the 15 units of Lantus daily along with 1000 mg twice daily Metformin.  He is applying for financial assistance.  He states he still eating quite a bit of carbohydrates throughout the day and has not adopted much in the way of vegetables at this time.  The patient states that he is improved overall and is no longer having anxiety or depression.  Note his hemoglobin A1c was greater than 11 at the last visit.   Past Medical History:  Diagnosis Date  . Cellulitis and abscess  of foot 09/29/2016  . Chronic systolic heart failure (HCC)   . Depression   . Diabetes mellitus without complication (HCC)   . Essential hypertension   . NICM (nonischemic cardiomyopathy) (HCC) EF 20-25%   . Suicidal ideations 07/12/2017     Family History  Problem Relation Age of Onset  . Diabetes Other   . Hypertension Other   . Diabetes Mother   . Brain cancer Father   . Mental illness Brother   . Drug abuse Brother   . Diabetes Brother      Social History   Socioeconomic History  . Marital status: Married    Spouse name: Not on file  . Number of children: Not on file  . Years of education: Not on file  . Highest education level: Not on file  Occupational History  . Not on file  Tobacco Use  . Smoking status: Current Every Day Smoker    Packs/day: 0.50    Years: 23.00    Pack years: 11.50    Types: Cigarettes  . Smokeless tobacco: Never Used  . Tobacco comment: Every other day   Vaping Use  . Vaping Use: Never used  Substance and Sexual Activity  . Alcohol use: Yes  . Drug use: Yes    Types: Cocaine, "Crack" cocaine  . Sexual activity: Not Currently  Other Topics Concern  . Not on file  Social History Narrative  . Not on file   Social Determinants of Health   Financial Resource Strain:   . Difficulty of Paying Living Expenses: Not on file  Food Insecurity:   . Worried About Programme researcher, broadcasting/film/video in the Last Year: Not on file  . Ran Out of Food in the Last Year: Not on file  Transportation Needs:   . Lack of Transportation (Medical): Not on file  . Lack of Transportation (Non-Medical): Not on file  Physical Activity:   . Days of Exercise per Week: Not on file  . Minutes of Exercise per Session: Not on file  Stress:   . Feeling of Stress : Not on file  Social Connections:   . Frequency of Communication with Friends and Family: Not on file  . Frequency of Social Gatherings with Friends and Family: Not on file  . Attends Religious Services: Not on file    . Active Member of Clubs or Organizations: Not on file  . Attends Banker Meetings: Not on file  . Marital Status: Not on file  Intimate Partner Violence:   . Fear of Current or Ex-Partner: Not on file  . Emotionally Abused: Not on file  . Physically Abused: Not on file  . Sexually Abused: Not on file     No Known Allergies   Outpatient Medications Prior to Visit  Medication Sig Dispense Refill  . atorvastatin (LIPITOR) 40 MG tablet TAKE 1 TABLET BY MOUTH DAILY FOR HIGH CHOLESTEROL 90 tablet 1  . blood glucose meter kit and  supplies Dispense based on patient and insurance preference. Use up to four times daily as directed. (FOR ICD-9 250.00, 250.01). 1 each 0  . Blood Glucose Monitoring Suppl (TRUE METRIX METER) w/Device KIT Use to measure blood sugar twice a day 1 kit 0  . buPROPion (WELLBUTRIN XL) 150 MG 24 hr tablet Take 1 tablet (150 mg total) by mouth daily. 90 tablet 1  . carvedilol (COREG) 25 MG tablet Take 1 tablet (25 mg total) by mouth 2 (two) times daily with a meal. 180 tablet 1  . glucose blood (TRUE METRIX BLOOD GLUCOSE TEST) test strip Use as instructed 100 each 12  . Insulin Syringe 27G X 1/2" 0.5 ML MISC Use with lantus 100 each 1  . lisinopril (ZESTRIL) 10 MG tablet Take 1 tablet (10 mg total) by mouth daily. 90 tablet 1  . metFORMIN (GLUCOPHAGE) 1000 MG tablet TAKE 1 TABLET BY MOUTH TWICE DAILY WITH A MEAL FOR DIABETES MANAGMEMENT 180 tablet 0  . TRUEplus Lancets 28G MISC Use to measure blood sugar twice a day 100 each 1  . insulin glargine (LANTUS) 100 UNIT/ML injection Inject 0.15 mLs (15 Units total) into the skin at bedtime. For diabetes management 13.5 mL 1  . sildenafil (VIAGRA) 100 MG tablet Take 0.5-1 tablets (50-100 mg total) by mouth daily as needed for erectile dysfunction. 5 tablet 11   No facility-administered medications prior to visit.       Review of Systems Constitutional:   No  weight loss, night sweats,  Fevers, chills, fatigue,  lassitude. HEENT:   No headaches,  Difficulty swallowing,  Tooth/dental problems,  Sore throat,                No sneezing, itching, ear ache, nasal congestion, post nasal drip,   CV:  No chest pain,  Orthopnea, PND, swelling in lower extremities, anasarca, dizziness, palpitations  GI  No heartburn, indigestion, abdominal pain, nausea, vomiting, diarrhea, change in bowel habits, loss of appetite  Resp: No shortness of breath with exertion or at rest.  No excess mucus, no productive cough,  No non-productive cough,  No coughing up of blood.  No change in color of mucus.  No wheezing.  No chest wall deformity  Skin: no rash or lesions.  GU: no dysuria, change in color of urine, no urgency or frequency.  No flank pain.  MS:  No joint pain or swelling.  No decreased range of motion.  No back pain.  Psych:   change in mood or affect. No depression or anxiety.  No memory loss.     Objective:   Physical Exam Vitals:   12/27/19 1056  Weight: 167 lb (75.8 kg)  Height: 6' (1.829 m)    Gen: Pleasant, well-nourished, in no distress,  normal affect  ENT: No lesions,  mouth clear,  oropharynx clear, no postnasal drip  Neck: No JVD, no TMG, no carotid bruits  Lungs: No use of accessory muscles, no dullness to percussion, clear without rales or rhonchi  Cardiovascular: RRR, heart sounds normal, no murmur or gallops, no peripheral edema  Abdomen: soft and NT, no HSM,  BS normal  Musculoskeletal: No deformities, no cyanosis or clubbing  Neuro: alert, non focal  Skin: Warm, no lesions or rashes  No recent labs in the system       Assessment & Plan:  I personally reviewed all images and lab data in the [facility] system as well as any outside material available during this office visit and agree  with the  radiology impressions.   Cardiomyopathy- suspect NICM but etiology not yet determined Cardiomyopathy stable at this time no change in medications continue Coreg at al  Essential  hypertension Hypertension stable this time no change in medications  Uncontrolled type 2 diabetes mellitus with hyperglycemia (HCC) Glycemic control still not optimal we will increase Lantus to 20 units daily and begin Jardiance 10 mg daily continue Metformin 1000 mg twice daily and follow-up closely in 1 month  Cocaine dependence with cocaine-induced mood disorder ([place]) Patient is no longer drinking alcohol or using cocaine and will continue Wellbutrin and intensive outpatient therapy  Tobacco use    . Current smoking consumption amount: One half pack a week  . Dicsussion on advise to quit smoking and smoking impacts: Cardiovascular impacts  . Patient's willingness to quit: Not ready to quit  . Methods to quit smoking discussed: Not discussed  . Medication management of smoking session drugs discussed: Not discussed  . Resources provided:  AVS   . Setting quit date not yet obtained  . Follow-up arranged short-term follow-up   Time spent counseling the patient: 5 minutes    Levante was seen today for follow-up.  Diagnoses and all orders for this visit:  Uncontrolled type 2 diabetes mellitus with hyperglycemia (HCC) -     insulin glargine (LANTUS) 100 UNIT/ML injection; Inject 0.2 mLs (20 Units total) into the skin at bedtime. For diabetes management -     Vitamin D 1,25 dihydroxy; Future  Cardiomyopathy, unspecified type ([employer])  Essential hypertension  Cocaine dependence with cocaine-induced mood disorder ([place])  Tobacco use  Other orders -     sildenafil (VIAGRA) 100 MG tablet; Take 0.5-1 tablets (50-100 mg total) by mouth daily as needed for erectile dysfunction. -     empagliflozin (JARDIANCE) 10 MG TABS tablet; Take 1 tablet (10 mg total) by mouth daily before breakfast.    Follow Up Instructions:   Patient knows a follow-up visit be made in 1 month and we will begin corticosteroids if the sed rate is elevated.  Patient knows physical therapy will be  engage  I discussed the assessment and treatment plan with the patient. The patient was provided an opportunity to ask questions and all were answered. The patient agreed with the plan and demonstrated an understanding of the instructions.   The patient was advised to call back or seek an in-person evaluation if the symptoms worsen or if the condition fails to improve as anticipated.  I provided 30 minutes of non-face-to-face time during this encounter  including  median intraservice time , review of notes, labs, imaging, medications  and explaining diagnosis and management to the patient .    Asencion Noble, MD

## 2019-12-27 NOTE — Assessment & Plan Note (Signed)
    Current smoking consumption amount: One half pack a week  Dicsussion on advise to quit smoking and smoking impacts: Cardiovascular impacts  Patient's willingness to quit: Not ready to quit  Methods to quit smoking discussed: Not discussed  Medication management of smoking session drugs discussed: Not discussed  Resources provided:  AVS   Setting quit date not yet obtained  Follow-up arranged short-term follow-up   Time spent counseling the patient: 5 minutes  

## 2019-12-27 NOTE — Assessment & Plan Note (Signed)
Glycemic control still not optimal we will increase Lantus to 20 units daily and begin Jardiance 10 mg daily continue Metformin 1000 mg twice daily and follow-up closely in 1 month

## 2019-12-27 NOTE — Assessment & Plan Note (Signed)
Cardiomyopathy stable at this time no change in medications continue Coreg at al

## 2019-12-28 ENCOUNTER — Other Ambulatory Visit: Payer: Self-pay

## 2019-12-28 ENCOUNTER — Ambulatory Visit: Payer: Self-pay | Attending: Critical Care Medicine

## 2019-12-28 ENCOUNTER — Ambulatory Visit (INDEPENDENT_AMBULATORY_CARE_PROVIDER_SITE_OTHER): Payer: No Payment, Other | Admitting: Behavioral Health

## 2019-12-28 DIAGNOSIS — F1420 Cocaine dependence, uncomplicated: Secondary | ICD-10-CM | POA: Diagnosis not present

## 2019-12-28 DIAGNOSIS — E1165 Type 2 diabetes mellitus with hyperglycemia: Secondary | ICD-10-CM

## 2019-12-29 LAB — FECAL OCCULT BLOOD, IMMUNOCHEMICAL: Fecal Occult Bld: NEGATIVE

## 2019-12-31 ENCOUNTER — Ambulatory Visit (HOSPITAL_COMMUNITY): Payer: No Typology Code available for payment source | Admitting: Behavioral Health

## 2019-12-31 NOTE — Group Note (Signed)
Group Topic: Relapse Prevention  Group Date: 12/28/2019 Start Time:  9:00 AM End Time: 12:00 PM Facilitators: Mamie Nick, Counselor  Department: [facility]  Number of Participants: 3  Group Focus: acceptance, anxiety, coping skills, depression, and relapse prevention Treatment Modality:  Cognitive Behavioral Therapy Interventions utilized were exploration and support Purpose: enhance coping skills, increase insight, and relapse prevention strategies   Name: AKAI DOLLARD Date of Birth: 25-Mar-1961  MR: [id number]    Level of Participation: active Quality of Participation: attentive and cooperative Interactions with others: gave feedback Mood/Affect: appropriate Triggers (if applicable): NA Cognition: coherent/clear Progress: Minimal Response: Arvine provided verbal feedback during group discussion. He talked about his family relationships that were damaged during his years of active addiction. Dalten shared with the group that he welcomed his new granddaughter last week. He began to talk about how much his missed from his children's upbringing as a result of his active addiction. He said that he made a vow to himself and his grandchildren that he would not miss any of their childhood. Kalup shared pictures of his granddaughter with the group. Murad was able to process his powerlessness to his addiction. He also shared that he was able to purchase his wife a new smart TV, over the weekend, for their home and this made him proud. Parke shared that in the past he would've used that money to "go get high" but since being in recovery he is proud to be able to do things for his wife. Plan: follow-up needed  Patients Problems:  Patient Active Problem List   Diagnosis Date Noted  . Tobacco use 12/11/2019  . Cocaine dependence with cocaine-induced mood disorder (HCC)   . Cocaine abuse (HCC)   . MDD (major depressive disorder) 01/14/2017  .  Cardiomyopathy- suspect NICM but etiology not yet determined 04/27/2016  . Chronic combined systolic and diastolic heart failure (HCC) 04/27/2016  . Uncontrolled type 2 diabetes mellitus with hyperglycemia (HCC) 04/26/2016  . Alcohol use disorder, mild, abuse 12/12/2015  . SPONDYLOSIS, CERVICAL, WITH RADICULOPATHY 02/25/2009  . HYPERCHOLESTEROLEMIA 05/09/2007  . ALLERGIC RHINITIS 12/21/2006  . Essential hypertension 12/07/2006     Family Program: Family present? No   Name of family member(s): NA  UDS collected: No Results: NA  AA/NA attended?: No  Sponsor?: No

## 2020-01-01 ENCOUNTER — Telehealth: Payer: Self-pay

## 2020-01-01 ENCOUNTER — Ambulatory Visit (HOSPITAL_COMMUNITY): Payer: No Typology Code available for payment source | Admitting: Licensed Clinical Social Worker

## 2020-01-01 NOTE — Telephone Encounter (Signed)
Contacted pt to go over fit test results pt is aware and doesn't have any questions or concerns  

## 2020-01-02 ENCOUNTER — Other Ambulatory Visit: Payer: Self-pay

## 2020-01-02 ENCOUNTER — Ambulatory Visit (INDEPENDENT_AMBULATORY_CARE_PROVIDER_SITE_OTHER): Payer: No Payment, Other | Admitting: Behavioral Health

## 2020-01-02 DIAGNOSIS — F1420 Cocaine dependence, uncomplicated: Secondary | ICD-10-CM | POA: Diagnosis not present

## 2020-01-02 NOTE — Group Note (Signed)
Group Topic: Healthy Boundaries  Group Date: 01/02/2020 Start Time:  9:00 AM End Time: 12:00 PM Facilitators: Mamie Nick, Counselor  Department: [facility]  Number of Participants: 2  Group Focus: acceptance, affirmation, anxiety, coping skills, family, and feeling awareness/expression Treatment Modality:  Cognitive Behavioral Therapy and Dialectical Behavioral Therapy Interventions utilized were exploration Purpose: enhance coping skills, express feelings, increase insight, regain self-worth, and relapse prevention strategies   Name: James [name] Date of Birth: 06-27-1960  MR: [id number]    Level of Participation: active Quality of Participation: attentive and cooperative Interactions with others: gave feedback Mood/Affect: appropriate Triggers (if applicable): NA Cognition: coherent/clear Progress: Moderate Response: [name] provided insightful feedback during group discussion. During check-in, [name] shared that he had to attend court during our last group meeting. He was able to process "the lost of the freedom to choose" is what he worries about often. He shared that he could potentially be incarcerated for several months if he is found guilty by the courts. He shared that he processed the idea that in the past he would've thought about using crack cocaine. However, he shared with the group that he is "choosing everyday to stay clean". [name] was attentive and receptive to information processed during group. Plan: follow-up needed  Patients Problems:  Patient Active Problem List   Diagnosis Date Noted  . Tobacco use 12/11/2019  . Cocaine dependence with cocaine-induced mood disorder (HCC)   . Cocaine abuse (HCC)   . MDD (major depressive disorder) 01/14/2017  . Cardiomyopathy- suspect NICM but etiology not yet determined 04/27/2016  . Chronic combined systolic and diastolic heart failure (HCC) 04/27/2016  . Uncontrolled type 2 diabetes  mellitus with hyperglycemia (HCC) 04/26/2016  . Alcohol use disorder, mild, abuse 12/12/2015  . SPONDYLOSIS, CERVICAL, WITH RADICULOPATHY 02/25/2009  . HYPERCHOLESTEROLEMIA 05/09/2007  . ALLERGIC RHINITIS 12/21/2006  . Essential hypertension 12/07/2006     Family Program: Family present? No   Name of family member(s): NA  UDS collected: No Results: NA  AA/NA attended?: No  Sponsor?: No

## 2020-01-04 ENCOUNTER — Ambulatory Visit (INDEPENDENT_AMBULATORY_CARE_PROVIDER_SITE_OTHER): Payer: No Payment, Other | Admitting: Behavioral Health

## 2020-01-04 ENCOUNTER — Other Ambulatory Visit: Payer: Self-pay

## 2020-01-04 DIAGNOSIS — F1420 Cocaine dependence, uncomplicated: Secondary | ICD-10-CM

## 2020-01-06 LAB — VITAMIN D 1,25 DIHYDROXY
Vitamin D 1, 25 (OH)2 Total: 32 pg/mL
Vitamin D2 1, 25 (OH)2: <10 pg/mL
Vitamin D3 1, 25 (OH)2: 32 pg/mL

## 2020-01-07 NOTE — Progress Notes (Signed)
Normal result letter sent via MyChart and mailed to pt 

## 2020-01-09 ENCOUNTER — Ambulatory Visit (INDEPENDENT_AMBULATORY_CARE_PROVIDER_SITE_OTHER): Payer: No Payment, Other | Admitting: Behavioral Health

## 2020-01-09 ENCOUNTER — Other Ambulatory Visit: Payer: Self-pay

## 2020-01-09 DIAGNOSIS — F1420 Cocaine dependence, uncomplicated: Secondary | ICD-10-CM

## 2020-01-09 MED FILL — ATORVASTATIN CALCIUM 40 MG: 40 | 30 days supply | Qty: 30 | Fill #0

## 2020-01-09 MED FILL — METFORMIN HCL 1000 MG TABS: 1000 | 30 days supply | Qty: 60 | Fill #0

## 2020-01-09 MED FILL — LISINOPRIL 10 MG TABS: 10 | 30 days supply | Qty: 30 | Fill #0

## 2020-01-09 MED FILL — BUPROPION HCL XL 150 MG TAB: 150 | 30 days supply | Qty: 30 | Fill #0

## 2020-01-09 MED FILL — !LANTUS SOLOSTAR 100UNITS/M: 100 | 20 days supply | Qty: 3 | Fill #0

## 2020-01-09 MED FILL — CARVEDILOL 25 MG TABLET: 25 | 30 days supply | Qty: 60 | Fill #0

## 2020-01-09 NOTE — Group Note (Signed)
Group Topic: Relapse Prevention  Group Date: 01/09/2020 Start Time:  9:00 AM End Time: 12:00 PM Facilitators: Mamie Nick, Counselor  Department: [facility]  Number of Participants: 4  Group Focus: acceptance, anxiety, coping skills, and forgiveness Treatment Modality:  Cognitive Behavioral Therapy Interventions utilized were confrontation, exploration, and group exercise Purpose: enhance coping skills and express feelings   Name: James [name] Date of Birth: [birth year]  MR: [id number]    Level of Participation: active Quality of Participation: attentive and cooperative Interactions with others: gave feedback Mood/Affect: appropriate Triggers (if applicable): N/A Cognition: coherent/clear Progress: Gaining insight Response: James [name] checked in by sharing that he rates his depression and anxiety at a zero. He talked about reconciling the relationship with his children. He reports these relationships were damaged by his past active addiction. He shared that he is also working to improve the relationship with his wife. James [name] engaged well in group discussion by offering insightful verbal feedback. Plan: follow-up needed  Patients Problems:  Patient Active Problem List   Diagnosis Date Noted  . Tobacco use 12/11/2019  . Cocaine dependence with cocaine-induced mood disorder (HCC)   . Cocaine abuse (HCC)   . MDD (major depressive disorder) 01/14/2017  . Cardiomyopathy- suspect NICM but etiology not yet determined 04/27/2016  . Chronic combined systolic and diastolic heart failure (HCC) 04/27/2016  . Uncontrolled type 2 diabetes mellitus with hyperglycemia (HCC) 04/26/2016  . Alcohol use disorder, mild, abuse 12/12/2015  . SPONDYLOSIS, CERVICAL, WITH RADICULOPATHY 02/25/2009  . HYPERCHOLESTEROLEMIA 05/09/2007  . ALLERGIC RHINITIS 12/21/2006  . Essential hypertension 12/07/2006     Family Program: Family present? No   Name of family member(s):  N/A  UDS collected: No Results: N/A  AA/NA attended?: No  Sponsor?: No

## 2020-01-11 ENCOUNTER — Other Ambulatory Visit: Payer: Self-pay

## 2020-01-11 ENCOUNTER — Ambulatory Visit (INDEPENDENT_AMBULATORY_CARE_PROVIDER_SITE_OTHER): Payer: No Payment, Other | Admitting: Behavioral Health

## 2020-01-11 DIAGNOSIS — F1420 Cocaine dependence, uncomplicated: Secondary | ICD-10-CM | POA: Diagnosis not present

## 2020-01-14 ENCOUNTER — Other Ambulatory Visit: Payer: Self-pay

## 2020-01-14 ENCOUNTER — Ambulatory Visit (INDEPENDENT_AMBULATORY_CARE_PROVIDER_SITE_OTHER): Payer: No Payment, Other | Admitting: Behavioral Health

## 2020-01-14 DIAGNOSIS — F1420 Cocaine dependence, uncomplicated: Secondary | ICD-10-CM

## 2020-01-14 NOTE — Group Note (Signed)
Group Topic: Mind Traps/Distorted Thinking  Group Date: 01/14/2020 Start Time:  9:00 AM End Time: 12:00 PM Facilitators: Mamie Nick, Counselor  Department: [facility]  Number of Participants: 3  Group Focus: acceptance, affirmation, communication, coping skills, and defense mechanisms Treatment Modality:  Cognitive Behavioral Therapy Interventions utilized were clarification, confrontation, exploration, and group exercise Purpose: enhance coping skills, explore maladaptive thinking, and increase insight   Name: James [name] Date of Birth: May 31, 1960  MR: [id number]    Level of Participation: active Quality of Participation: attentive and cooperative Interactions with others: gave feedback Mood/Affect: appropriate Triggers (if applicable): NA Cognition: coherent/clear Progress: Moderate Response: [name] shared during check-in that he enjoyed spending time with his family this weekend. He shared that he was able to meet and hold his new granddaughter. He shared that he cherishes those moments because he states he would not be able to do those things if he was still in his active addiction. [name] rated his depression and anxiety at [facility] on his self-inventory sheet. In discussing defense mechanisms (DMs) in active addiction, [name] identified using the following DMs: blaming, lying, cockiness, accusing, justifying, and humor. [name] also inquired about drug testing during [facility] group, stating he would like proof for the court that he has been clean from drugs and alcohol. [name] was encouraged to process his intrinsic motivations for his sobriety. [name] engaged well in group discussion and provided positive feedback.   Plan: follow-up needed  Patients Problems:  Patient Active Problem List   Diagnosis Date Noted  . Tobacco use 12/11/2019  . Cocaine dependence with cocaine-induced mood disorder (HCC)   . Cocaine abuse (HCC)   . MDD (major  depressive disorder) 01/14/2017  . Cardiomyopathy- suspect NICM but etiology not yet determined 04/27/2016  . Chronic combined systolic and diastolic heart failure (HCC) 04/27/2016  . Uncontrolled type 2 diabetes mellitus with hyperglycemia (HCC) 04/26/2016  . Alcohol use disorder, mild, abuse 12/12/2015  . SPONDYLOSIS, CERVICAL, WITH RADICULOPATHY 02/25/2009  . HYPERCHOLESTEROLEMIA 05/09/2007  . ALLERGIC RHINITIS 12/21/2006  . Essential hypertension 12/07/2006     Family Program: Family present? No   Name of family member(s): NA  UDS collected: No Results: NA  AA/NA attended?: No  Sponsor?: No

## 2020-01-14 NOTE — Group Note (Signed)
Group Topic: Grief and Loss  Group Date: 01/11/2020 Start Time:  9:00 AM End Time: 12:00 PM Facilitators: Mamie Nick, Counselor  Department: [facility]  Number of Participants: 2  Group Focus: acceptance, affirmation, and loss/grief issues Treatment Modality:  Cognitive Behavioral Therapy Interventions utilized were confrontation and exploration Purpose: enhance coping skills, express feelings, and relapse prevention strategies   Name: James [name] Date of Birth: 1961-01-01  MR: [id number]    Level of Participation: active Quality of Participation: attention seeking and attentive Interactions with others: gave feedback Mood/Affect: appropriate Triggers (if applicable): NA Cognition: coherent/clear Progress: Moderate Response: [name] checked-in rating his depression and anxiety at 8, on a scale 0 to 10. He shared with the group that he plans to go and visit his children and grandchildren in [place]. He reports he has been doing well in his recovery. [name] he has set a goal to "keep a positive mind". [name] talked about how he coped after the death of his mother. He shared that he stayed clean from alcohol and drugs for 25 months. [name] was able to identify that death has been a trigger for him and has caused him to relapse to alcohol and drug use. He processed the emotions that death typically causes for him and how he plans to prepare for when he experiences grief and loss in the future. [name] was cooperative and attentive during group session. Plan: follow-up needed  Patients Problems:  Patient Active Problem List   Diagnosis Date Noted  . Tobacco use 12/11/2019  . Cocaine dependence with cocaine-induced mood disorder (HCC)   . Cocaine abuse (HCC)   . MDD (major depressive disorder) 01/14/2017  . Cardiomyopathy- suspect NICM but etiology not yet determined 04/27/2016  . Chronic combined systolic and diastolic heart failure (HCC)  02/63/7858  . Uncontrolled type 2 diabetes mellitus with hyperglycemia (HCC) 04/26/2016  . Alcohol use disorder, mild, abuse 12/12/2015  . SPONDYLOSIS, CERVICAL, WITH RADICULOPATHY 02/25/2009  . HYPERCHOLESTEROLEMIA 05/09/2007  . ALLERGIC RHINITIS 12/21/2006  . Essential hypertension 12/07/2006     Family Program: Family present? No   Name of family member(s): NA  UDS collected: No Results: NA  AA/NA attended?: No  Sponsor?: No

## 2020-01-16 ENCOUNTER — Other Ambulatory Visit: Payer: Self-pay

## 2020-01-16 ENCOUNTER — Ambulatory Visit (INDEPENDENT_AMBULATORY_CARE_PROVIDER_SITE_OTHER): Payer: No Payment, Other | Admitting: Behavioral Health

## 2020-01-16 DIAGNOSIS — F1420 Cocaine dependence, uncomplicated: Secondary | ICD-10-CM | POA: Diagnosis not present

## 2020-01-16 MED FILL — TRUE METRIX TEST STRIP: 25 days supply | Qty: 50 | Fill #0

## 2020-01-16 NOTE — Group Note (Signed)
Group Topic: Guilt/Shame  Group Date: 01/16/2020 Start Time:  9:00 AM End Time: 11:00 AM Facilitators: Mamie Nick, Counselor  Department: [facility]  Number of Participants: 1  Group Focus: acceptance, coping skills, family, feeling awareness/expression, and forgiveness Treatment Modality:  Cognitive Behavioral Therapy and Individual Therapy Interventions utilized were exploration Purpose: enhance coping skills, express feelings, and increase insight   Name: James [name] Date of Birth: 10-17-1960  MR: [id number]    Level of Participation: active Quality of Participation: attentive and cooperative Interactions with others: gave feedback Mood/Affect: appropriate Triggers (if applicable): N/A  Cognition: coherent/clear Progress: Moderate Response: [name] was the only group member present for group today. He checked-in by sharing that he would need a letter for court to show proof that he is enrolled in [facility] group therapy. Pt shared that he has been working full time and spending time with his family. [name] talked about guilt and shame and that he has learned that he has to forgive himself for his past active addiction and addictive behaviors. [name] talked about spending time with is grandchildren and how seeing them reminds him all that he missed out on during his days of active addiction. Plan: follow-up needed  Patients Problems:  Patient Active Problem List   Diagnosis Date Noted  . Tobacco use 12/11/2019  . Cocaine dependence with cocaine-induced mood disorder (HCC)   . Cocaine abuse (HCC)   . MDD (major depressive disorder) 01/14/2017  . Cardiomyopathy- suspect NICM but etiology not yet determined 04/27/2016  . Chronic combined systolic and diastolic heart failure (HCC) 04/27/2016  . Uncontrolled type 2 diabetes mellitus with hyperglycemia (HCC) 04/26/2016  . Alcohol use disorder, mild, abuse 12/12/2015  . SPONDYLOSIS, CERVICAL, WITH  RADICULOPATHY 02/25/2009  . HYPERCHOLESTEROLEMIA 05/09/2007  . ALLERGIC RHINITIS 12/21/2006  . Essential hypertension 12/07/2006     Family Program: Family present? No   Name of family member(s): N/A  UDS collected: No Results: N/A  AA/NA attended?: No  Sponsor?: No

## 2020-01-18 ENCOUNTER — Ambulatory Visit (INDEPENDENT_AMBULATORY_CARE_PROVIDER_SITE_OTHER): Payer: No Payment, Other | Admitting: Behavioral Health

## 2020-01-18 ENCOUNTER — Other Ambulatory Visit: Payer: Self-pay

## 2020-01-18 DIAGNOSIS — F1420 Cocaine dependence, uncomplicated: Secondary | ICD-10-CM

## 2020-01-18 NOTE — Group Note (Signed)
Group Topic: Identifying Triggers  Group Date: 01/18/2020 Start Time:  9:00 AM End Time: 12:00 PM Facilitators: Mamie Nick, Counselor  Department: [facility]  Number of Participants: 3  Group Focus: acceptance, coping skills, family, feeling awareness/expression, and forgiveness Treatment Modality:  Cognitive Behavioral Therapy Interventions utilized were confrontation and exploration Purpose: enhance coping skills, express feelings, increase insight, and trigger / craving management   Name: James [name] Date of Birth: [birth year]  MR: [id number]    Level of Participation: active Quality of Participation: attentive and cooperative Interactions with others: gave feedback Mood/Affect: appropriate Triggers (if applicable): N/A Cognition: coherent/clear Progress: Moderate Response: James [name] engaged well in group discussion by providing insightful verbal feedback. James [name] talked about a recent argument he had with his house about household chores. He shared that while completing household chores he found an old crack pipe in a closet in his home. He shared with the group "my stomach started doing flips because I felt like it was my addiction trying to test me". James [name] states he did not relapse but acknowledges that his addiction is still haunting him daily and that he reminds himself of his family when he has an urge or craving. He shared with the group that "smashed" the crack pipe the next morning before going to work. Plan: follow-up needed  Patients Problems:  Patient Active Problem List   Diagnosis Date Noted  . Tobacco use 12/11/2019  . Cocaine dependence with cocaine-induced mood disorder (HCC)   . Cocaine abuse (HCC)   . MDD (major depressive disorder) 01/14/2017  . Cardiomyopathy- suspect NICM but etiology not yet determined 04/27/2016  . Chronic combined systolic and diastolic heart failure (HCC) 04/27/2016  . Uncontrolled type 2 diabetes  mellitus with hyperglycemia (HCC) 04/26/2016  . Alcohol use disorder, mild, abuse 12/12/2015  . SPONDYLOSIS, CERVICAL, WITH RADICULOPATHY 02/25/2009  . HYPERCHOLESTEROLEMIA 05/09/2007  . ALLERGIC RHINITIS 12/21/2006  . Essential hypertension 12/07/2006     Family Program: Family present? No   Name of family member(s): N/A  UDS collected: No Results: N/A  AA/NA attended?: No  Sponsor?: No

## 2020-01-21 ENCOUNTER — Encounter (HOSPITAL_COMMUNITY): Payer: Self-pay

## 2020-01-21 ENCOUNTER — Ambulatory Visit (INDEPENDENT_AMBULATORY_CARE_PROVIDER_SITE_OTHER): Payer: No Payment, Other | Admitting: Behavioral Health

## 2020-01-21 ENCOUNTER — Other Ambulatory Visit: Payer: Self-pay

## 2020-01-21 DIAGNOSIS — F1420 Cocaine dependence, uncomplicated: Secondary | ICD-10-CM | POA: Diagnosis not present

## 2020-01-21 DIAGNOSIS — E1165 Type 2 diabetes mellitus with hyperglycemia: Secondary | ICD-10-CM

## 2020-01-21 NOTE — Group Note (Signed)
Group Topic: Boundaries  Group Date: 01/21/2020 Start Time:  9:00 AM End Time: 12:00 PM Facilitators: Mamie Nick, Counselor  Department: [facility]  Number of Participants: 3  Group Focus: acceptance, affirmation, anxiety, coping skills, and family Treatment Modality:  Cognitive Behavioral Therapy Interventions utilized were confrontation and exploration Purpose: enhance coping skills, express feelings, regain self-worth, and relapse prevention strategies   Name: James [name] Date of Birth: April 11, 1961  MR: [id number]    Level of Participation: active Quality of Participation: attentive and cooperative Interactions with others: gave feedback Mood/Affect: appropriate Triggers (if applicable): N/A Cognition: coherent/clear and insightful Progress: Moderate Response: [name] shared helpful feedback in group discussion. During check-in, he shared that he enjoyed his weekend with his family. He shared that they all went to the [place] and he states he enjoyed eating all the different foods. [name] has a positive outlook as it related to his recovery efforts. He was transparent when he shared with the group the things his addiction tried to take away from him. On his self-inventory sheet, he rated his depression and anxiety at a ZERO, on a scale 0 to 10 (10 being the worst). He shared that his only goal for today is "to stay clean just for today". [name] was receptive to advice and support offered during group discussion.                                                           Plan: follow-up needed  Patients Problems:  Patient Active Problem List   Diagnosis Date Noted  . Tobacco use 12/11/2019  . Cocaine dependence with cocaine-induced mood disorder (HCC)   . Cocaine abuse (HCC)   . MDD (major depressive disorder) 01/14/2017  . Cardiomyopathy- suspect NICM but etiology not yet determined 04/27/2016  . Chronic combined systolic and  diastolic heart failure (HCC) 04/27/2016  . Uncontrolled type 2 diabetes mellitus with hyperglycemia (HCC) 04/26/2016  . Alcohol use disorder, mild, abuse 12/12/2015  . SPONDYLOSIS, CERVICAL, WITH RADICULOPATHY 02/25/2009  . HYPERCHOLESTEROLEMIA 05/09/2007  . ALLERGIC RHINITIS 12/21/2006  . Essential hypertension 12/07/2006     Family Program: Family present? No   Name of family member(s): N/A  UDS collected: No Results: N/A  AA/NA attended?: No  Sponsor?: NA

## 2020-01-21 NOTE — Progress Notes (Signed)
   [facility] Follow-up Outpatient CDIOP Date:   Admission Date:  Sobriety date:  Subjective:   HPI   Review of Systems: Psychiatric: Agitation: No Hallucination: No Depressed Mood: Yes much better Insomnia: No Hypersomnia: No Altered Concentration: No Feels Worthless: No Grandiose Ideas: No Belief In Special Powers: No New/Increased Substance Abuse: No Compulsions: No  Neurologic: Headache: No Seizure: No Paresthesias: No  Current Medications:   Vital Signs  Mental Status Examination  Appearance: Alert: Yes Attention: good  Cooperative: Yes Eye Contact: Good Speech: Clear and coherent Psychomotor Activity: Normal Memory/Concentration: Normal/intact Oriented: person, place, time/date and situation Mood: Euthymic Affect: Appropriate and Congruent Thought Processes and Associations: Coherent and Intact Fund of Knowledge: Good Thought Content: WDL Insight: Good Judgement: Good  UDS:  PDMP:  Diagnosis:   Assessment:  Treatment Plan: James Boyajian, PA-C 

## 2020-01-23 ENCOUNTER — Ambulatory Visit (HOSPITAL_COMMUNITY): Payer: Self-pay | Admitting: Behavioral Health

## 2020-01-24 ENCOUNTER — Encounter: Payer: Self-pay | Admitting: Critical Care Medicine

## 2020-01-24 ENCOUNTER — Other Ambulatory Visit: Payer: Self-pay

## 2020-01-24 ENCOUNTER — Ambulatory Visit: Payer: Self-pay | Attending: Critical Care Medicine | Admitting: Critical Care Medicine

## 2020-01-24 VITALS — BP 100/59 | HR 82 | Temp 97.5°F | Resp 18 | Ht 72.0 in | Wt 167.4 lb

## 2020-01-24 DIAGNOSIS — E7800 Pure hypercholesterolemia, unspecified: Secondary | ICD-10-CM

## 2020-01-24 DIAGNOSIS — I5042 Chronic combined systolic (congestive) and diastolic (congestive) heart failure: Secondary | ICD-10-CM

## 2020-01-24 DIAGNOSIS — Z87898 Personal history of other specified conditions: Secondary | ICD-10-CM

## 2020-01-24 DIAGNOSIS — N529 Male erectile dysfunction, unspecified: Secondary | ICD-10-CM

## 2020-01-24 DIAGNOSIS — F1411 Cocaine abuse, in remission: Secondary | ICD-10-CM

## 2020-01-24 DIAGNOSIS — E1165 Type 2 diabetes mellitus with hyperglycemia: Secondary | ICD-10-CM

## 2020-01-24 DIAGNOSIS — I429 Cardiomyopathy, unspecified: Secondary | ICD-10-CM

## 2020-01-24 DIAGNOSIS — I10 Essential (primary) hypertension: Secondary | ICD-10-CM

## 2020-01-24 NOTE — Progress Notes (Signed)
Reported CBG of 149 at home prior to appt this morning  Needs refills on all meds sent to [facility] pharmacy

## 2020-01-24 NOTE — Assessment & Plan Note (Signed)
Blood pressure in good control at this time no change in medicines

## 2020-01-24 NOTE — Assessment & Plan Note (Signed)
Stable systolic diastolic heart failure patient asked to follow-up with cardiology no change in medications

## 2020-01-24 NOTE — Assessment & Plan Note (Signed)
History of alcohol use the patient currently is not using alcohol

## 2020-01-24 NOTE — Assessment & Plan Note (Signed)
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

## 2020-01-24 NOTE — Assessment & Plan Note (Signed)
History of cocaine use currently the patient is clean and states he is not been using cocaine recently

## 2020-01-24 NOTE — Assessment & Plan Note (Signed)
Type 2 diabetes uncontrolled currently with improved status continue current program

## 2020-01-24 NOTE — Patient Instructions (Signed)
No change in medications refills are available to you at our pharmacy on all your medications  Keep your cardiology appointment and discuss with him the concerns you have with your erectile dysfunction  Please obtain an eye exam I suggest [facility] with Dr. Nedra Hai  We will check a testosterone level today to evaluate your erectile dysfunction  Please complete your financial paperwork and obtain a financial counselor appointment so he can get her orange card which will get you access to dental care  Continue to work on smoking cessation Smoking Tobacco Information, Adult Smoking tobacco can be harmful to your health. Tobacco contains a poisonous (toxic), colorless chemical called nicotine. Nicotine is addictive. It changes the brain and can make it hard to stop smoking. Tobacco also has other toxic chemicals that can hurt your body and raise your risk of many cancers. How can smoking tobacco affect me? Smoking tobacco puts you at risk for:  Cancer. Smoking is most commonly associated with lung cancer, but can also lead to cancer in other parts of the body.  Chronic obstructive pulmonary disease (COPD). This is a long-term lung condition that makes it hard to breathe. It also gets worse over time.  High blood pressure (hypertension), heart disease, stroke, or heart attack.  Lung infections, such as pneumonia.  Cataracts. This is when the lenses in the eyes become clouded.  Digestive problems. This may include peptic ulcers, heartburn, and gastroesophageal reflux disease (GERD).  Oral health problems, such as gum disease and tooth loss.  Loss of taste and smell. Smoking can affect your appearance by causing:  Wrinkles.  Yellow or stained teeth, fingers, and fingernails. Smoking tobacco can also affect your social life, because:  It may be challenging to find places to smoke when away from home. Many workplaces, [employer], hotels, and public places are tobacco-free.  Smoking  is expensive. This is due to the cost of tobacco and the long-term costs of treating health problems from smoking.  Secondhand smoke may affect those around you. Secondhand smoke can cause lung cancer, breathing problems, and heart disease. Children of smokers have a higher risk for: ? Sudden infant death syndrome (SIDS). ? Ear infections. ? Lung infections. If you currently smoke tobacco, quitting now can help you:  Lead a longer and healthier life.  Look, smell, breathe, and feel better over time.  Save money.  Protect others from the harms of secondhand smoke. What actions can I take to prevent health problems? Quit smoking   Do not start smoking. Quit if you already do.  Make a plan to quit smoking and commit to it. Look for programs to help you and ask your health care provider for recommendations and ideas.  Set a date and write down all the reasons you want to quit.  Let your friends and family know you are quitting so they can help and support you. Consider finding friends who also want to quit. It can be easier to quit with someone else, so that you can support each other.  Talk with your health care provider about using nicotine replacement medicines to help you quit, such as gum, lozenges, patches, sprays, or pills.  Do not replace cigarette smoking with electronic cigarettes, which are commonly called e-cigarettes. The safety of e-cigarettes is not known, and some may contain harmful chemicals.  If you try to quit but return to smoking, stay positive. It is common to slip up when you first quit, so take it one day at a time.  Be prepared for cravings. When you feel the urge to smoke, chew gum or suck on hard candy. Lifestyle  Stay busy and take care of your body.  Drink enough fluid to keep your urine pale yellow.  Get plenty of exercise and eat a healthy diet. This can help prevent weight gain after quitting.  Monitor your eating habits. Quitting smoking can  cause you to have a larger appetite than when you smoke.  Find ways to relax. Go out with friends or family to a movie or a restaurant where people do not smoke.  Ask your health care provider about having regular tests (screenings) to check for cancer. This may include blood tests, imaging tests, and other tests.  Find ways to manage your stress, such as meditation, yoga, or exercise. Where to find support To get support to quit smoking, consider:  Asking your health care provider for more information and resources.  Taking classes to learn more about quitting smoking.  Looking for local organizations that offer resources about quitting smoking.  Joining a support group for people who want to quit smoking in your local community.  Calling the smokefree.gov counselor helpline: 1-800-Quit-Now 251-551-1809) Where to find more information You may find more information about quitting smoking from:  HelpGuide.org: www.helpguide.org  BankRights[employer]: smokefree.gov  American Lung Association: www.lung.org Contact a health care provider if you:  Have problems breathing.  Notice that your lips, nose, or fingers turn blue.  Have chest pain.  Are coughing up blood.  Feel faint or you pass out.  Have other health changes that cause you to worry. Summary  Smoking tobacco can negatively affect your health, the health of those around you, your finances, and your social life.  Do not start smoking. Quit if you already do. If you need help quitting, ask your health care provider.  Think about joining a support group for people who want to quit smoking in your local community. There are many effective programs that will help you to quit this behavior. This information is not intended to replace advice given to you by your health care provider. Make sure you discuss any questions you have with your health care provider. Document Revised: 12/22/2018 Document Reviewed:  04/13/2016 Elsevier Patient Education  [employer].   Return to Dr. Delford Field in 4 months   Erectile Dysfunction Erectile dysfunction (ED) is the inability to get or keep an erection in order to have sexual intercourse. Erectile dysfunction may include:  Inability to get an erection.  Lack of enough hardness of the erection to allow penetration.  Loss of the erection before sex is finished. What are the causes? This condition may be caused by:  Certain medicines, such as: ? Pain relievers. ? Antihistamines. ? Antidepressants. ? Blood pressure medicines. ? Water pills (diuretics). ? Ulcer medicines. ? Muscle relaxants. ? Drugs.  Excessive drinking.  Psychological causes, such as: ? Anxiety. ? Depression. ? Sadness. ? Exhaustion. ? Performance fear. ? Stress.  Physical causes, such as: ? Artery problems. This may include diabetes, smoking, liver disease, or atherosclerosis. ? High blood pressure. ? Hormonal problems, such as low testosterone. ? Obesity. ? Nerve problems. This may include back or pelvic injuries, diabetes mellitus, multiple sclerosis, or Parkinson disease. What are the signs or symptoms? Symptoms of this condition include:  Inability to get an erection.  Lack of enough hardness of the erection to allow penetration.  Loss of the erection before sex is finished.  Normal erections at some times, but  with frequent unsatisfactory episodes.  Low sexual satisfaction in either partner due to erection problems.  A curved penis occurring with erection. The curve may cause pain or the penis may be too curved to allow for intercourse.  Never having nighttime erections. How is this diagnosed? This condition is often diagnosed by:  Performing a physical exam to find other diseases or specific problems with the penis.  Asking you detailed questions about the problem.  Performing blood tests to check for diabetes mellitus or to measure hormone  levels.  Performing other tests to check for underlying health conditions.  Performing an ultrasound exam to check for scarring.  Performing a test to check blood flow to the penis.  Doing a sleep study at home to measure nighttime erections. How is this treated? This condition may be treated by:  Medicine taken by mouth to help you achieve an erection (oral medicine).  Hormone replacement therapy to replace low testosterone levels.  Medicine that is injected into the penis. Your health care provider may instruct you how to give yourself these injections at home.  Vacuum pump. This is a pump with a ring on it. The pump and ring are placed on the penis and used to create pressure that helps the penis become erect.  Penile implant surgery. In this procedure, you may receive: ? An inflatable implant. This consists of cylinders, a pump, and a reservoir. The cylinders can be inflated with a fluid that helps to create an erection, and they can be deflated after intercourse. ? A semi-rigid implant. This consists of two silicone rubber rods. The rods provide some rigidity. They are also flexible, so the penis can both curve downward in its normal position and become straight for sexual intercourse.  Blood vessel surgery, to improve blood flow to the penis. During this procedure, a blood vessel from a different part of the body is placed into the penis to allow blood to flow around (bypass) damaged or blocked blood vessels.  Lifestyle changes, such as exercising more, losing weight, and quitting smoking. Follow these instructions at home: Medicines   Take over-the-counter and prescription medicines only as told by your health care provider. Do not increase the dosage without first discussing it with your health care provider.  If you are using self-injections, perform injections as directed by your health care provider. Make sure to avoid any veins that are on the surface of the penis. After  giving an injection, apply pressure to the injection site for 5 minutes. General instructions  Exercise regularly, as directed by your health care provider. Work with your health care provider to lose weight, if needed.  Do not use any products that contain nicotine or tobacco, such as cigarettes and e-cigarettes. If you need help quitting, ask your health care provider.  Before using a vacuum pump, read the instructions that come with the pump and discuss any questions with your health care provider.  Keep all follow-up visits as told by your health care provider. This is important. Contact a health care provider if:  You feel nauseous.  You vomit. Get help right away if:  You are taking oral or injectable medicines and you have an erection that lasts longer than 4 hours. If your health care provider is unavailable, go to the nearest emergency room for evaluation. An erection that lasts much longer than 4 hours can result in permanent damage to your penis.  You have severe pain in your groin or abdomen.  You develop  redness or severe swelling of your penis.  You have redness spreading up into your groin or lower abdomen.  You are unable to urinate.  You experience chest pain or a rapid heart beat (palpitations) after taking oral medicines. Summary  Erectile dysfunction (ED) is the inability to get or keep an erection during sexual intercourse. This problem can usually be treated successfully.  This condition is diagnosed based on a physical exam, your symptoms, and tests to determine the cause. Treatment varies depending on the cause, and may include medicines, hormone therapy, surgery, or vacuum pump.  You may need follow-up visits to make sure that you are using your medicines or devices correctly.  Get help right away if you are taking or injecting medicines and you have an erection that lasts longer than 4 hours. This information is not intended to replace advice given to  you by your health care provider. Make sure you discuss any questions you have with your health care provider. Document Revised: 03/11/2017 Document Reviewed: 04/14/2016 Elsevier Patient Education  2020 [employer].

## 2020-01-24 NOTE — Progress Notes (Signed)
Subjective:    Patient ID: James [name], male    DOB: 1960/08/07, 59 y.o.   MRN: [id number]    History of Present Illness: 12/11/2019 This is a 59 year old male who comes [facility] wishing to establish for primary care.  He currently is uninsured and now has housing.  Formally he was homeless.  He formally was being seen by Raechel Chute in the [facility] but now that he has housing he is seeking primary care here at the health and wellness center.  Patient has a prior history of hypertension, type 2 diabetes, nonischemic cardiomyopathy, hyperlipidemia, chronic systolic diastolic heart failure, major depression, use of cocaine chronically, history of suicidal ideation.  The patient has been in and out of the criminal justice system as well more recently in May of this year.  At that time while on insulin he felt he had hypoglycemic spells in the jail area.  He says his last cocaine was in PCP to establish needs med refill  Hx HTN, T2DM, NICM, HLD, Chronic systolic/diastolic HF, Major Depression, Hx cocaine use, hx of suicidal ideation  Patient states he is experiencing continued anxiety and depression.  He is requesting an outpatient program from substance use disorder.  He does have a job working in [employer].  The patient did receive his Covid vaccine series ending in May.  He has yet to receive influenza vaccine and is due a tetanus vaccine.  On arrival blood pressure is 157/75.  The patient states he is not been on any of his medications in several weeks as he ran out of his medicines including his insulin and blood pressure heart failure medicines  The patient does not have any any pending appointments with cardiology.  Previously the patient [facility] and was now referred here to primary care for to establish.  On arrival blood sugar 293 and hemoglobin A1c was 11.9.  He is requesting a prescription for erectile dysfunction Viagra.   He does have very poor dentition.  He is no longer drinking alcohol.  12/27/2019 This is a telephone follow-up visit for this 59 year old male with history of cocaine use, cardiomyopathy, alcohol use, depression, hypercholesterolemia, type 2 diabetes, major depression, history of suicidal ideation and anxiety.  This patient is reached to by phone today and he states he is markedly improved.  His no longer having any chest pain shortness of breath or edema.  He is reduced his tobacco intake to about 1/2 pack a day.  He is no longer taking cocaine and he is in now enrolled in an intensive outpatient program for cocaine use.  He states his blood sugars in the afternoons are 1 68-1 78 mornings are in the 200s his lowest is 127.  He is on the 15 units of Lantus daily along with 1000 mg twice daily Metformin.  He is applying for financial assistance.  He states he still eating quite a bit of carbohydrates throughout the day and has not adopted much in the way of vegetables at this time.  The patient states that he is improved overall and is no longer having anxiety or depression.  Note his hemoglobin A1c was greater than 11 at the last visit.  01/24/20 Patient seen in return follow-up for hypertension and diabetes his blood sugar has been well controlled he states it has been in the low 100 range on his current program he is not had any evidence of hypoglycemia.  He  has been compliant with his blood pressure medications.  His largest complaint is that of continued erectile dysfunction.  He wishes to explore that further.  He has been on the Viagra without much improvement.  On arrival blood pressure is 100/60.  Past Medical History:  Diagnosis Date  . Cellulitis and abscess of foot 09/29/2016  . Chronic systolic heart failure ([place])   . Cocaine abuse ([place])   . Cocaine dependence with cocaine-induced mood disorder ([place])   . Depression   . Diabetes mellitus without complication ([place])   . Essential  hypertension   . NICM (nonischemic cardiomyopathy) ([employer]) EF 20-25%   . Suicidal ideations 07/12/2017     Family History  Problem Relation Age of Onset  . Diabetes Other   . Hypertension Other   . Diabetes Mother   . Brain cancer Father   . Mental illness Brother   . Drug abuse Brother   . Diabetes Brother      Social History   Socioeconomic History  . Marital status: Married    Spouse name: Not on file  . Number of children: Not on file  . Years of education: Not on file  . Highest education level: Not on file  Occupational History  . Not on file  Tobacco Use  . Smoking status: Current Every Day Smoker    Packs/day: 0.50    Years: 23.00    Pack years: 11.50    Types: Cigarettes  . Smokeless tobacco: Never Used  . Tobacco comment: Every other day   Vaping Use  . Vaping Use: Never used  Substance and Sexual Activity  . Alcohol use: Yes  . Drug use: Yes    Types: Cocaine, "Crack" cocaine  . Sexual activity: Not Currently  Other Topics Concern  . Not on file  Social History Narrative  . Not on file   Social Determinants of Health   Financial Resource Strain:   . Difficulty of Paying Living Expenses: Not on file  Food Insecurity:   . Worried About Charity fundraiser in the Last Year: Not on file  . Ran Out of Food in the Last Year: Not on file  Transportation Needs:   . Lack of Transportation (Medical): Not on file  . Lack of Transportation (Non-Medical): Not on file  Physical Activity:   . Days of Exercise per Week: Not on file  . Minutes of Exercise per Session: Not on file  Stress:   . Feeling of Stress : Not on file  Social Connections:   . Frequency of Communication with Friends and Family: Not on file  . Frequency of Social Gatherings with Friends and Family: Not on file  . Attends Religious Services: Not on file  . Active Member of Clubs or Organizations: Not on file  . Attends Archivist Meetings: Not on file  . Marital Status: Not on  file  Intimate Partner Violence:   . Fear of Current or Ex-Partner: Not on file  . Emotionally Abused: Not on file  . Physically Abused: Not on file  . Sexually Abused: Not on file     No Known Allergies   Outpatient Medications Prior to Visit  Medication Sig Dispense Refill  . atorvastatin (LIPITOR) 40 MG tablet TAKE 1 TABLET BY MOUTH DAILY FOR HIGH CHOLESTEROL 90 tablet 1  . blood glucose meter kit and supplies Dispense based on patient and insurance preference. Use up to four times daily as directed. (FOR ICD-9 250.00, 250.01). 1 each  0  . Blood Glucose Monitoring Suppl (TRUE METRIX METER) w/Device KIT Use to measure blood sugar twice a day 1 kit 0  . buPROPion (WELLBUTRIN XL) 150 MG 24 hr tablet Take 1 tablet (150 mg total) by mouth daily. 90 tablet 1  . carvedilol (COREG) 25 MG tablet Take 1 tablet (25 mg total) by mouth 2 (two) times daily with a meal. 180 tablet 1  . cholecalciferol (VITAMIN D3) 25 MCG (1000 UNIT) tablet Take 1,000 Units by mouth daily.    . empagliflozin (JARDIANCE) 10 MG TABS tablet Take 1 tablet (10 mg total) by mouth daily before breakfast. 30 tablet 1  . glucose blood (TRUE METRIX BLOOD GLUCOSE TEST) test strip Use as instructed 100 each 12  . insulin glargine (LANTUS) 100 UNIT/ML injection Inject 0.2 mLs (20 Units total) into the skin at bedtime. For diabetes management 13.5 mL 1  . Insulin Syringe 27G X 1/2" 0.5 ML MISC Use with lantus 100 each 1  . lisinopril (ZESTRIL) 10 MG tablet Take 1 tablet (10 mg total) by mouth daily. 90 tablet 1  . metFORMIN (GLUCOPHAGE) 1000 MG tablet TAKE 1 TABLET BY MOUTH TWICE DAILY WITH A MEAL FOR DIABETES MANAGMEMENT 180 tablet 0  . sildenafil (VIAGRA) 100 MG tablet Take 0.5-1 tablets (50-100 mg total) by mouth daily as needed for erectile dysfunction. 5 tablet 11  . TRUEplus Lancets 28G MISC Use to measure blood sugar twice a day 100 each 1   No facility-administered medications prior to visit.       Review of  Systems  Constitutional:   No  weight loss, night sweats,  Fevers, chills, fatigue, lassitude. HEENT:   No headaches,  Difficulty swallowing,  Tooth/dental problems,  Sore throat,                No sneezing, itching, ear ache, nasal congestion, post nasal drip,   CV:  No chest pain,  Orthopnea, PND, swelling in lower extremities, anasarca, dizziness, palpitations  GI  No heartburn, indigestion, abdominal pain, nausea, vomiting, diarrhea, change in bowel habits, loss of appetite  Resp: No shortness of breath with exertion or at rest.  No excess mucus, no productive cough,  No non-productive cough,  No coughing up of blood.  No change in color of mucus.  No wheezing.  No chest wall deformity  Skin: no rash or lesions.  GU: no dysuria, change in color of urine, no urgency or frequency.  No flank pain.  MS:  No joint pain or swelling.  No decreased range of motion.  No back pain.  Psych:  no change in mood or affect. No depression or anxiety.  No memory loss.     Objective:   Physical Exam  Vitals:   01/24/20 1147  BP: (!) 100/59  Pulse: 82  Resp: 18  Temp: (!) 97.5 F (36.4 C)  TempSrc: Temporal  SpO2: 98%  Weight: 167 lb 6.4 oz (75.9 kg)  Height: 6' (1.829 m)    Gen: Pleasant, well-nourished, in no distress,  normal affect  ENT: No lesions,  mouth clear,  oropharynx clear, no postnasal drip  Neck: No JVD, no TMG, no carotid bruits  Lungs: No use of accessory muscles, no dullness to percussion, clear without rales or rhonchi  Cardiovascular: RRR, heart sounds normal, no murmur or gallops, no peripheral edema  Abdomen: soft and NT, no HSM,  BS normal  Musculoskeletal: No deformities, no cyanosis or clubbing  Neuro: alert, non focal  Skin: Warm, no  lesions or rashes      Assessment & Plan:  I personally reviewed all images and lab data in the [facility] system as well as any outside material available during this office visit and agree with the  radiology impressions.    Vasculogenic erectile dysfunction Suspect vasculogenic erectile dysfunction however will check a testosterone level he is to continue the sildenafil and we may yet require urology referral the beta-blocker may be playing a role asked him discuss this with cardiology at his upcoming appointments  History of cocaine abuse ([place]) History of cocaine use currently the patient is clean and states he is not been using cocaine recently  History of alcohol use History of alcohol use the patient currently is not using alcohol  Uncontrolled type 2 diabetes mellitus with hyperglycemia ([place]) Type 2 diabetes uncontrolled currently with improved status continue current program  Chronic combined systolic and diastolic heart failure ([facility]) Stable systolic diastolic heart failure patient asked to follow-up with cardiology no change in medications  Essential hypertension Blood pressure in good control at this time no change in medicines   Myrle was seen today for follow-up.  Diagnoses and all orders for this visit:  Vasculogenic erectile dysfunction, unspecified vasculogenic erectile dysfunction type -     Testosterone  Cardiomyopathy, unspecified type ([place])  Uncontrolled type 2 diabetes mellitus with hyperglycemia ([place])  HYPERCHOLESTEROLEMIA  History of cocaine abuse ([place])  History of alcohol use  Chronic combined systolic and diastolic heart failure ([place])  Essential hypertension  Flu vaccine was given

## 2020-01-25 ENCOUNTER — Ambulatory Visit (INDEPENDENT_AMBULATORY_CARE_PROVIDER_SITE_OTHER): Payer: No Payment, Other | Admitting: Behavioral Health

## 2020-01-25 DIAGNOSIS — F1420 Cocaine dependence, uncomplicated: Secondary | ICD-10-CM | POA: Diagnosis not present

## 2020-01-25 LAB — TESTOSTERONE: Testosterone: 431 ng/dL (ref 264–916)

## 2020-01-25 NOTE — Group Note (Addendum)
Group Topic: Relapse Prevention  Group Date: 01/25/2020 Start Time:  9:00 AM End Time: 12:00 PM Facilitators: Mamie Nick, Counselor  Department: [facility]  Number of Participants: 3  Group Focus: acceptance, coping skills, depression, dual diagnosis, forgiveness, and relapse prevention Treatment Modality:  Cognitive Behavioral Therapy Interventions utilized were clarification, confrontation, and exploration Purpose: enhance coping skills, express feelings, increase insight, and regain self-worth   Name: James [name] Date of Birth: December 18, 1960  MR: [id number]    Level of Participation: active Quality of Participation: attentive and cooperative Interactions with others: gave feedback Mood/Affect: appropriate Triggers (if applicable): N/A Cognition: coherent/clear Progress: Moderate Response: [name] shared insightful information during group discussion. He shared that he became defensive when his felt like his wife was accusing him of stealing. He states he became upset because he started thinking that he's been making strides to change. He shared that he went outside to his backyard and he was able to process his past behaviors back when he was in active addiction. He was insightful in his ability to acknowledge that he was faced with some addictive thinking but he was able to challenge those thoughts by reminding himself of his intrinsic motivation to stay sober from alcohol/drugs. In discussing Stages of Change, [name] was able to identify when he was in early stages of change. He shared that he is currently in the Maintenance stage of change in his recovery.  Plan: follow-up needed  Patients Problems:  Patient Active Problem List   Diagnosis Date Noted  . Vasculogenic erectile dysfunction 01/24/2020  . Tobacco use 12/11/2019  . MDD (major depressive disorder) 01/14/2017  . Cardiomyopathy- suspect NICM but etiology not yet determined  04/27/2016  . Chronic combined systolic and diastolic heart failure (HCC) 04/27/2016  . Uncontrolled type 2 diabetes mellitus with hyperglycemia (HCC) 04/26/2016  . History of alcohol use 12/12/2015  . History of cocaine abuse (HCC) 06/25/2015  . SPONDYLOSIS, CERVICAL, WITH RADICULOPATHY 02/25/2009  . HYPERCHOLESTEROLEMIA 05/09/2007  . ALLERGIC RHINITIS 12/21/2006  . Essential hypertension 12/07/2006     Family Program: Family present? No   Name of family member(s): N/A  UDS collected: No Results: N/A  AA/NA attended?: No  Sponsor?: NA

## 2020-01-28 ENCOUNTER — Ambulatory Visit (HOSPITAL_COMMUNITY): Payer: Self-pay

## 2020-01-29 NOTE — Progress Notes (Signed)
Referring-Patrick Joya Gaskins, MD Reason for referral-cardiomyopathy  HPI: 59 year old male for evaluation of cardiomyopathy and congestive heart failure at request of Asencion Noble, MD.  Seen previously but not since July 2018. Patient admitted in January 2018 with dyspnea felt to be a combination of pneumonia and congestive heart failure. Echocardiogram showed severely reduced LV function with ejection fraction 20-25%, restrictive filling, mild left ventricular enlargement, mild to moderate mitral regurgitation and moderately elevated pulmonary pressures. Cardiac catheterization January 2018 showed 50% LAD but otherwise no obstructive coronary disease. Patient was treated with antibiotics and diuretics with improvement. ABIs June 2018 normal. Follow-up echocardiogram July 2018 showed normal LV systolic function and grade 1 diastolic dysfunction.   Patient denies dyspnea on exertion, orthopnea, PND, pedal edema, chest pain or syncope.  Current Outpatient Medications  Medication Sig Dispense Refill  . atorvastatin (LIPITOR) 40 MG tablet TAKE 1 TABLET BY MOUTH DAILY FOR HIGH CHOLESTEROL 90 tablet 1  . blood glucose meter kit and supplies Dispense based on patient and insurance preference. Use up to four times daily as directed. (FOR ICD-9 250.00, 250.01). 1 each 0  . Blood Glucose Monitoring Suppl (TRUE METRIX METER) w/Device KIT Use to measure blood sugar twice a day 1 kit 0  . buPROPion (WELLBUTRIN XL) 150 MG 24 hr tablet Take 1 tablet (150 mg total) by mouth daily. 90 tablet 1  . cholecalciferol (VITAMIN D3) 25 MCG (1000 UNIT) tablet Take 1,000 Units by mouth daily.    Marland Kitchen glucose blood (TRUE METRIX BLOOD GLUCOSE TEST) test strip Use as instructed 100 each 12  . insulin glargine (LANTUS) 100 UNIT/ML injection Inject 0.2 mLs (20 Units total) into the skin at bedtime. For diabetes management 13.5 mL 1  . Insulin Syringe 27G X 1/2" 0.5 ML MISC Use with lantus 100 each 1  . lisinopril (ZESTRIL) 10 MG  tablet Take 1 tablet (10 mg total) by mouth daily. 90 tablet 1  . metFORMIN (GLUCOPHAGE) 1000 MG tablet TAKE 1 TABLET BY MOUTH TWICE DAILY WITH A MEAL FOR DIABETES MANAGMEMENT 180 tablet 0  . sildenafil (VIAGRA) 100 MG tablet Take 0.5-1 tablets (50-100 mg total) by mouth daily as needed for erectile dysfunction. 5 tablet 11  . TRUEplus Lancets 28G MISC Use to measure blood sugar twice a day 100 each 1  . empagliflozin (JARDIANCE) 10 MG TABS tablet Take 1 tablet (10 mg total) by mouth daily before breakfast. 30 tablet 1   No current facility-administered medications for this visit.    No Known Allergies   Past Medical History:  Diagnosis Date  . Cellulitis and abscess of foot 09/29/2016  . Chronic systolic heart failure ([place])   . Cocaine abuse ([place])   . Cocaine dependence with cocaine-induced mood disorder ([place])   . Depression   . Diabetes mellitus without complication ([place])   . Essential hypertension   . NICM (nonischemic cardiomyopathy) ([place]) EF 20-25%   . Suicidal ideations 07/12/2017    Past Surgical History:  Procedure Laterality Date  . CARDIAC CATHETERIZATION N/A 04/30/2016   Procedure: Right/Left Heart Cath and Coronary Angiography;  Surgeon: Nelva Bush, MD;  Location: [facility];  Service: Cardiovascular;  Laterality: N/A;  . FINGER SURGERY      Social History   Socioeconomic History  . Marital status: Married    Spouse name: Not on file  . Number of children: Not on file  . Years of education: Not on file  . Highest education level: Not on file  Occupational History  .  Not on file  Tobacco Use  . Smoking status: Current Every Day Smoker    Packs/day: 0.50    Years: 23.00    Pack years: 11.50    Types: Cigarettes  . Smokeless tobacco: Never Used  . Tobacco comment: Every other day   Vaping Use  . Vaping Use: Never used  Substance and Sexual Activity  . Alcohol use: Yes  . Drug use: Yes    Types: Cocaine, "Crack" cocaine  . Sexual activity: Not  Currently  Other Topics Concern  . Not on file  Social History Narrative  . Not on file   Social Determinants of Health   Financial Resource Strain:   . Difficulty of Paying Living Expenses: Not on file  Food Insecurity:   . Worried About Charity fundraiser in the Last Year: Not on file  . Ran Out of Food in the Last Year: Not on file  Transportation Needs:   . Lack of Transportation (Medical): Not on file  . Lack of Transportation (Non-Medical): Not on file  Physical Activity:   . Days of Exercise per Week: Not on file  . Minutes of Exercise per Session: Not on file  Stress:   . Feeling of Stress : Not on file  Social Connections:   . Frequency of Communication with Friends and Family: Not on file  . Frequency of Social Gatherings with Friends and Family: Not on file  . Attends Religious Services: Not on file  . Active Member of Clubs or Organizations: Not on file  . Attends Archivist Meetings: Not on file  . Marital Status: Not on file  Intimate Partner Violence:   . Fear of Current or Ex-Partner: Not on file  . Emotionally Abused: Not on file  . Physically Abused: Not on file  . Sexually Abused: Not on file    Family History  Problem Relation Age of Onset  . Diabetes Other   . Hypertension Other   . Diabetes Mother   . Brain cancer Father   . Mental illness Brother   . Drug abuse Brother   . Diabetes Brother     ROS: no fevers or chills, productive cough, hemoptysis, dysphasia, odynophagia, melena, hematochezia, dysuria, hematuria, rash, seizure activity, orthopnea, PND, pedal edema, claudication. Remaining systems are negative.  Physical Exam:   Blood pressure 127/64, pulse 96, height 6' (1.829 m), weight 170 lb (77.1 kg), SpO2 97 %.  General:  Well developed/well nourished in NAD Skin warm/dry Patient not depressed No peripheral clubbing Back-normal HEENT-normal/normal eyelids Neck supple/normal carotid upstroke bilaterally; no bruits; no  JVD; no thyromegaly chest - CTA/ normal expansion CV - RRR/normal S1 and S2; no murmurs, rubs or gallops;  PMI nondisplaced Abdomen -NT/ND, no HSM, no mass, + bowel sounds, no bruit 2+ femoral pulses, no bruits Ext-no edema, chords, 2+ DP Neuro-grossly nonfocal  A/P  1 nonischemic cardiomyopathy-LV function improved on most recent echocardiogram.  We will repeat study.  Continue lisinopril.  He discontinued carvedilol on his own due to ED.  If LV function reduced we will resume.  2 coronary artery disease-nonobstructive on previous heart catheterization.  Continue medical therapy with statin.  Add aspirin 81 mg daily.  3 hypertension-patient's blood pressure is controlled.  Continue present medical regimen.  4 hyperlipidemia-continue statin.  5 chronic systolic congestive heart failure-euvolemic on examination.  6 history of substance abuse-patient states he has not used cocaine since May.  I encouraged him to continue to avoid.  7 tobacco  abuse-patient counseled on discontinuing.  Kirk Ruths, MD

## 2020-01-30 ENCOUNTER — Ambulatory Visit (HOSPITAL_COMMUNITY): Payer: Self-pay

## 2020-02-01 ENCOUNTER — Ambulatory Visit (HOSPITAL_COMMUNITY): Payer: Self-pay

## 2020-02-04 ENCOUNTER — Ambulatory Visit (HOSPITAL_COMMUNITY): Payer: Self-pay

## 2020-02-05 ENCOUNTER — Ambulatory Visit (INDEPENDENT_AMBULATORY_CARE_PROVIDER_SITE_OTHER): Payer: Self-pay | Admitting: Cardiology

## 2020-02-05 ENCOUNTER — Encounter: Payer: Self-pay | Admitting: *Deleted

## 2020-02-05 ENCOUNTER — Encounter: Payer: Self-pay | Admitting: Cardiology

## 2020-02-05 ENCOUNTER — Other Ambulatory Visit: Payer: Self-pay

## 2020-02-05 VITALS — BP 127/64 | HR 96 | Ht 72.0 in | Wt 170.0 lb

## 2020-02-05 DIAGNOSIS — E7800 Pure hypercholesterolemia, unspecified: Secondary | ICD-10-CM

## 2020-02-05 DIAGNOSIS — I10 Essential (primary) hypertension: Secondary | ICD-10-CM

## 2020-02-05 DIAGNOSIS — I5042 Chronic combined systolic (congestive) and diastolic (congestive) heart failure: Secondary | ICD-10-CM

## 2020-02-05 DIAGNOSIS — I420 Dilated cardiomyopathy: Secondary | ICD-10-CM

## 2020-02-05 MED ORDER — ASPIRIN EC 81 MG PO TBEC
81.0000 mg | DELAYED_RELEASE_TABLET | Freq: Every day | ORAL | 3 refills | Status: DC
Start: 2020-02-05 — End: 2020-06-23

## 2020-02-05 NOTE — Patient Instructions (Signed)
Medication Instructions:   START ASPIRIN 81 MG ONCE DAILY  *If you need a refill on your cardiac medications before your next appointment, please call your pharmacy*.   Testing/Procedures:  Your physician has requested that you have an echocardiogram. Echocardiography is a painless test that uses sound waves to create images of your heart. It provides your doctor with information about the size and shape of your heart and how well your heart's chambers and valves are working. This procedure takes approximately one hour. There are no restrictions for this procedure.[address]    Follow-Up: At [facility], you and your health needs are our priority.  As part of our continuing mission to provide you with exceptional heart care, we have created designated Provider Care Teams.  These Care Teams include your primary Cardiologist (physician) and Advanced Practice Providers (APPs -  Physician Assistants and Nurse Practitioners) who all work together to provide you with the care you need, when you need it.  We recommend signing up for the patient portal called "MyChart".  Sign up information is provided on this After Visit Summary.  MyChart is used to connect with patients for Virtual Visits (Telemedicine).  Patients are able to view lab/test results, encounter notes, upcoming appointments, etc.  Non-urgent messages can be sent to your provider as well.   To learn more about what you can do with MyChart, go to [email or web address].    Your next appointment:   6 month(s)  The format for your next appointment:   In Person  Provider:   Olga Millers, MD

## 2020-02-06 ENCOUNTER — Ambulatory Visit (HOSPITAL_COMMUNITY): Payer: Self-pay

## 2020-02-08 ENCOUNTER — Ambulatory Visit: Payer: Self-pay

## 2020-02-08 ENCOUNTER — Ambulatory Visit (HOSPITAL_COMMUNITY): Payer: Self-pay

## 2020-02-08 MED FILL — ATORVASTATIN CALCIUM 40 MG: 40 | 30 days supply | Qty: 30 | Fill #1

## 2020-02-08 MED FILL — $LANTUS SOLOSTAR 100 UNITS/: 100 | 40 days supply | Qty: 6 | Fill #2

## 2020-02-08 MED FILL — METFORMIN HCL 1000 MG TABS: 1000 | 30 days supply | Qty: 60 | Fill #1

## 2020-02-08 MED FILL — BUPROPION HCL XL 150 MG TAB: 150 | 30 days supply | Qty: 30 | Fill #1

## 2020-02-08 MED FILL — JARDIANCE 10 MG TABLET: 10 | 14 days supply | Qty: 14 | Fill #0

## 2020-02-08 MED FILL — LISINOPRIL 10 MG TABS: 10 | 30 days supply | Qty: 30 | Fill #1

## 2020-02-12 ENCOUNTER — Ambulatory Visit (HOSPITAL_COMMUNITY): Payer: No Typology Code available for payment source

## 2020-02-12 ENCOUNTER — Other Ambulatory Visit: Payer: Self-pay

## 2020-02-26 ENCOUNTER — Ambulatory Visit (INDEPENDENT_AMBULATORY_CARE_PROVIDER_SITE_OTHER): Payer: No Payment, Other | Admitting: Behavioral Health

## 2020-02-26 ENCOUNTER — Other Ambulatory Visit: Payer: Self-pay

## 2020-02-26 ENCOUNTER — Ambulatory Visit (HOSPITAL_COMMUNITY): Payer: Self-pay

## 2020-02-26 DIAGNOSIS — F1420 Cocaine dependence, uncomplicated: Secondary | ICD-10-CM

## 2020-03-04 ENCOUNTER — Other Ambulatory Visit: Payer: Self-pay

## 2020-03-04 ENCOUNTER — Ambulatory Visit (INDEPENDENT_AMBULATORY_CARE_PROVIDER_SITE_OTHER): Payer: No Payment, Other | Admitting: Behavioral Health

## 2020-03-04 DIAGNOSIS — F1420 Cocaine dependence, uncomplicated: Secondary | ICD-10-CM | POA: Diagnosis not present

## 2020-03-04 MED FILL — TRUE METRIX TEST STRIP: 25 days supply | Qty: 50 | Fill #1

## 2020-03-04 MED FILL — SILDENAFIL CITRATE 100 MG T: 100 | 30 days supply | Qty: 10 | Fill #0

## 2020-03-04 NOTE — Group Note (Signed)
Group Topic: Relapse Prevention  Group Date: 03/04/2020 Start Time: 10:00 AM End Time: 11:00 AM Facilitators: Mamie Nick, Counselor  Department: [facility]  Number of Participants: 2  Group Focus: anxiety, check in, coping skills, family, feeling awareness/expression, forgiveness, and holidays Treatment Modality:  Cognitive Behavioral Therapy Interventions utilized were clarification, confrontation, and exploration Purpose: enhance coping skills, express feelings, increase insight, and relapse prevention strategies   Name: James [name] Date of Birth: 16-Feb-1961  MR: [id number]    Level of Participation: active Quality of Participation: attentive and cooperative Interactions with others: gave feedback Mood/Affect: appropriate Triggers (if applicable): N/A Cognition: coherent/clear Progress: Significant Response: [name] checked in by sharing that he is doing well. He shared that he is looking forward to spending the holidays with his wife, his children, and his grandchildren. He shared a story with group about Thanksgiving from last year when he was in active addiction. He shared that he keeps that thought in the forefront of his mind as a reminder of "the place" he never wants to go back to. He shared that he took his family for granted during his days of active addiction and he wants to enjoy the time he has with family. He asked this Clinical research associate to prepare a letter for him to take to court next week. [name] shared insightful verbal feedback during group discussion.  Plan: follow-up needed  Patients Problems:  Patient Active Problem List   Diagnosis Date Noted  . Vasculogenic erectile dysfunction 01/24/2020  . Tobacco use 12/11/2019  . MDD (major depressive disorder) 01/14/2017  . Cardiomyopathy- suspect NICM but etiology not yet determined 04/27/2016  . Chronic combined systolic and diastolic heart failure (HCC) 04/27/2016  . Uncontrolled type 2  diabetes mellitus with hyperglycemia (HCC) 04/26/2016  . History of alcohol use 12/12/2015  . History of cocaine abuse (HCC) 06/25/2015  . SPONDYLOSIS, CERVICAL, WITH RADICULOPATHY 02/25/2009  . HYPERCHOLESTEROLEMIA 05/09/2007  . ALLERGIC RHINITIS 12/21/2006  . Essential hypertension 12/07/2006     Family Program: Family present? No   Name of family member(s): N/A  UDS collected: No Results: N/A  AA/NA attended?: No  Sponsor?: No

## 2020-03-11 ENCOUNTER — Ambulatory Visit (HOSPITAL_COMMUNITY): Payer: Self-pay | Admitting: Behavioral Health

## 2020-03-11 ENCOUNTER — Other Ambulatory Visit: Payer: Self-pay | Admitting: Critical Care Medicine

## 2020-03-11 MED FILL — BUPROPION HCL XL 150 MG TAB: 150 | 30 days supply | Qty: 30 | Fill #2

## 2020-03-11 MED FILL — LISINOPRIL 10 MG TABS: 10 | 30 days supply | Qty: 30 | Fill #2

## 2020-03-11 MED FILL — $LANTUS SOLOSTAR 100 UNITS/: 100 | 20 days supply | Qty: 3 | Fill #0

## 2020-03-17 NOTE — Group Note (Signed)
Group Topic: Guilt/Shame  Group Date: 01/04/2020 Start Time:  9:00 AM End Time: 12:00 PM Facilitators: Mamie Nick, Counselor  Department: [facility]  Number of Participants: 2  Group Focus: acceptance, affirmation, anger management, co-dependency, coping skills, and depression Treatment Modality:  Cognitive Behavioral Therapy Interventions utilized were clarification, confrontation, and exploration Purpose: increase insight, reinforce self-care, relapse prevention strategies, and trigger / craving management   Name: James [name] Date of Birth: March 29, 1961  MR: [id number]    Level of Participation: active Quality of Participation: attentive and cooperative Interactions with others: gave feedback Mood/Affect: appropriate Triggers (if applicable): N/A Cognition: coherent/clear Progress: Moderate Response: Haadi checked-in by sharing that he has been avoiding his triggers. He shared with group that he keeps running into old friends that he use to get high with. He shared that he gave one of these individuals a car ride one day and he noticed this was a mistake. He shared that he started to get "flips" in his stomach. He was able to identify these "flips" as his addiction. He talked about the amount of guilt and shame that he experienced while he was locked up away from his family. Vihan was receptive to feedback offered during group discussion.  Plan: follow-up needed  Patients Problems:  Patient Active Problem List   Diagnosis Date Noted  . Vasculogenic erectile dysfunction 01/24/2020  . Tobacco use 12/11/2019  . MDD (major depressive disorder) 01/14/2017  . Cardiomyopathy- suspect NICM but etiology not yet determined 04/27/2016  . Chronic combined systolic and diastolic heart failure (HCC) 04/27/2016  . Uncontrolled type 2 diabetes mellitus with hyperglycemia (HCC) 04/26/2016  . History of alcohol use 12/12/2015  . History of cocaine abuse  (HCC) 06/25/2015  . SPONDYLOSIS, CERVICAL, WITH RADICULOPATHY 02/25/2009  . HYPERCHOLESTEROLEMIA 05/09/2007  . ALLERGIC RHINITIS 12/21/2006  . Essential hypertension 12/07/2006     Family Program: Family present? No   Name of family member(s): N/A  UDS collected: No Results: N/A  AA/NA attended?: No  Sponsor?: No

## 2020-03-17 NOTE — Progress Notes (Signed)
James [name] is a 59 y.o. male patient who presents to [facility] for a scheduled [facility] intake appointment. Pt completed PCP, comprehensive crisis plan, and NCTOPPS. Pt enrolled in [facility].        Mamie Nick, Counselor

## 2020-03-18 ENCOUNTER — Ambulatory Visit (HOSPITAL_COMMUNITY): Payer: Self-pay | Admitting: Behavioral Health

## 2020-03-25 ENCOUNTER — Ambulatory Visit (HOSPITAL_COMMUNITY): Payer: Self-pay | Attending: Cardiology

## 2020-03-25 ENCOUNTER — Other Ambulatory Visit: Payer: Self-pay

## 2020-03-25 ENCOUNTER — Ambulatory Visit (INDEPENDENT_AMBULATORY_CARE_PROVIDER_SITE_OTHER): Payer: No Payment, Other | Admitting: Behavioral Health

## 2020-03-25 ENCOUNTER — Encounter (HOSPITAL_COMMUNITY): Payer: Self-pay | Admitting: Cardiology

## 2020-03-25 DIAGNOSIS — F1420 Cocaine dependence, uncomplicated: Secondary | ICD-10-CM | POA: Diagnosis not present

## 2020-03-25 NOTE — Group Note (Signed)
Group Topic: General Coping Skills  Group Date: 03/25/2020 Start Time: 10:00 AM End Time: 11:00 AM Facilitators: Mamie Nick, Counselor  Department: [facility]  Number of Participants: 1  Group Focus: acceptance, affirmation, check in, clarity of thought, family, feeling awareness/expression, and healthy friendships Treatment Modality:  Cognitive Behavioral Therapy Interventions utilized were clarification, confrontation, and exploration Purpose: enhance coping skills, explore maladaptive thinking, express feelings, and regain self-worth   Name: James [name] Date of Birth: 13-Sep-1960  MR: [id number]    Level of Participation: active Quality of Participation: attentive and cooperative Interactions with others: gave feedback Mood/Affect: appropriate Triggers (if applicable): N/A Cognition: coherent/clear Progress: Moderate Response: Aylen checked-in by sharing that his dog recently had to be put to rest. He shared that this caused him to be sad for his wife and grandchildren. He purchased his family a new puppy and this made Rossi happy knowing that he was able to do this for his family. Sonia shared that he has been sober from alcohol and drugs for "7 months". He shared with this Clinical research associate the thing that is driving his recovery is his "family" and fear of losing his freedom. Paulanthony denied recent urges/cravings. He states he has been using his coping skills such as spending time with his family and working. Jenson reports he completed and passed his urine drug screen with his Engineer, drilling. Plan: follow-up needed  Patients Problems:  Patient Active Problem List   Diagnosis Date Noted  . Vasculogenic erectile dysfunction 01/24/2020  . Tobacco use 12/11/2019  . MDD (major depressive disorder) 01/14/2017  . Cardiomyopathy- suspect NICM but etiology not yet determined 04/27/2016  . Chronic combined systolic and diastolic heart failure (HCC)  26/37/8588  . Uncontrolled type 2 diabetes mellitus with hyperglycemia (HCC) 04/26/2016  . History of alcohol use 12/12/2015  . History of cocaine abuse (HCC) 06/25/2015  . SPONDYLOSIS, CERVICAL, WITH RADICULOPATHY 02/25/2009  . HYPERCHOLESTEROLEMIA 05/09/2007  . ALLERGIC RHINITIS 12/21/2006  . Essential hypertension 12/07/2006     Family Program: Family present? No   Name of family member(s): N/A  UDS collected: No Results: N/A  AA/NA attended?: No  Sponsor?: No

## 2020-04-01 ENCOUNTER — Ambulatory Visit (HOSPITAL_COMMUNITY): Payer: Self-pay

## 2020-04-03 ENCOUNTER — Telehealth (HOSPITAL_COMMUNITY): Payer: Self-pay | Admitting: Cardiology

## 2020-04-03 NOTE — Telephone Encounter (Signed)
Just an FYI. °We have made several attempts to contact this patient including sending a letter to schedule or reschedule their echocardiogram. We will be removing the patient from the echo WQ. ° ° °03/25/20 NO SHOW- MAILED LETTER/LBW  ° ° ° ° °Thank you °

## 2020-04-08 ENCOUNTER — Ambulatory Visit (INDEPENDENT_AMBULATORY_CARE_PROVIDER_SITE_OTHER): Payer: No Payment, Other | Admitting: Behavioral Health

## 2020-04-08 ENCOUNTER — Other Ambulatory Visit: Payer: Self-pay

## 2020-04-08 DIAGNOSIS — F1420 Cocaine dependence, uncomplicated: Secondary | ICD-10-CM | POA: Diagnosis not present

## 2020-04-08 NOTE — Group Note (Signed)
Group Topic: General Coping Skills  Group Date: 04/08/2020 Start Time: 10:00 AM End Time: 11:00 AM Facilitators: Mamie Nick, Counselor  Department: [facility]  Number of Participants: 1  Group Focus: acceptance, activities of daily living skills, check in, family, and holidays Treatment Modality:  Cognitive Behavioral Therapy Interventions utilized were clarification, confrontation, and exploration Purpose: enhance coping skills, explore maladaptive thinking, express feelings, and increase insight   Name: James [name] Date of Birth: [birth year]  MR: [id number]    Level of Participation: active Quality of Participation: attentive and cooperative Interactions with others: gave feedback Mood/Affect: appropriate Triggers (if applicable): N/A Cognition: coherent/clear, insightful and logical Progress: Significant Response: [name] checked-in by sharing that he enjoyed spending the holidays with his family. He states this has been the best Christmas holiday he has experienced in years. He shared that he did not experience any cravings or urges to use. He remembers pasts Christmas holidays when he was in active addiction and he said "I never want to go back to that place". [name] provided insightful feedback during today's session. He also reports he has upcoming court dates that he would need documentation from this writer to present to his lawyer. Clt further reports "I am clean and I am grateful".  Plan: follow-up needed  Patients Problems:  Patient Active Problem List   Diagnosis Date Noted  . Vasculogenic erectile dysfunction 01/24/2020  . Tobacco use 12/11/2019  . MDD (major depressive disorder) 01/14/2017  . Cardiomyopathy- suspect NICM but etiology not yet determined 04/27/2016  . Chronic combined systolic and diastolic heart failure (HCC) 04/27/2016  . Uncontrolled type 2 diabetes mellitus with hyperglycemia (HCC) 04/26/2016  . History of  alcohol use 12/12/2015  . History of cocaine abuse (HCC) 06/25/2015  . SPONDYLOSIS, CERVICAL, WITH RADICULOPATHY 02/25/2009  . HYPERCHOLESTEROLEMIA 05/09/2007  . ALLERGIC RHINITIS 12/21/2006  . Essential hypertension 12/07/2006     Family Program: Family present? No   Name of family member(s): N/A  UDS collected: No Results: N/A  AA/NA attended?: No  Sponsor?: No

## 2020-04-09 MED FILL — $LANTUS SOLOSTAR 100 UNITS/: 100 | 20 days supply | Qty: 3 | Fill #1

## 2020-04-09 MED FILL — BUPROPION HCL XL 150 MG TAB: 150 | 30 days supply | Qty: 30 | Fill #3

## 2020-04-09 MED FILL — ATORVASTATIN CALCIUM 40 MG: 40 | 30 days supply | Qty: 30 | Fill #2

## 2020-04-09 MED FILL — LISINOPRIL 10 MG TABS: 10 | 30 days supply | Qty: 30 | Fill #3

## 2020-04-09 MED FILL — METFORMIN HCL 1000 MG TABS: 1000 | 30 days supply | Qty: 60 | Fill #0

## 2020-04-15 ENCOUNTER — Other Ambulatory Visit: Payer: Self-pay

## 2020-04-15 ENCOUNTER — Ambulatory Visit (INDEPENDENT_AMBULATORY_CARE_PROVIDER_SITE_OTHER): Payer: No Payment, Other | Admitting: Behavioral Health

## 2020-04-15 DIAGNOSIS — F1420 Cocaine dependence, uncomplicated: Secondary | ICD-10-CM | POA: Diagnosis not present

## 2020-04-15 NOTE — Group Note (Addendum)
Group Topic: General Coping Skills  Group Date: 04/15/2020 Start Time: 10:00 AM End Time: 11:00 AM Facilitators: Mamie Nick, Counselor  Department: [facility]  Number of Participants: 1  Group Focus: anger management, coping skills, depression, family, feeling awareness/expression, forgiveness, relapse prevention, and relaxation Treatment Modality:  Cognitive Behavioral Therapy Interventions utilized were clarification, confrontation, and exploration Purpose: enhance coping skills, express feelings, and increase insight   Name: James [name] Date of Birth: 1961/01/12  MR: [id number]    Level of Participation: active Quality of Participation: attentive and cooperative Interactions with others: gave feedback Mood/Affect: appropriate Triggers (if applicable): N/A Cognition: coherent/clear and insightful Progress: Significant Response: James [name] checked-in by sharing that he is doing well and maintaining his sobriety. Clt shared that he has learned his triggers and ways to co-exist in a world surrounded by triggers. Clt shared that he recently ran into an old friend who he used to use crack cocaine with and this brought back memories. Clt practiced thought-stopping techniques and "playe the forward". Clt engaged well in today's session. Clt also requested a progress letter from this writer so he can give to his lawyer and present during his upcoming court date. Continue to monitor. Plan: follow-up needed  Patients Problems:  Patient Active Problem List   Diagnosis Date Noted  . Vasculogenic erectile dysfunction 01/24/2020  . Tobacco use 12/11/2019  . MDD (major depressive disorder) 01/14/2017  . Cardiomyopathy- suspect NICM but etiology not yet determined 04/27/2016  . Chronic combined systolic and diastolic heart failure (HCC) 04/27/2016  . Uncontrolled type 2 diabetes mellitus with hyperglycemia (HCC) 04/26/2016  . History of alcohol use 12/12/2015   . History of cocaine abuse (HCC) 06/25/2015  . SPONDYLOSIS, CERVICAL, WITH RADICULOPATHY 02/25/2009  . HYPERCHOLESTEROLEMIA 05/09/2007  . ALLERGIC RHINITIS 12/21/2006  . Essential hypertension 12/07/2006     Family Program: Family present? No   Name of family member(s): N/A  UDS collected: Yes Results: negative  AA/NA attended?: No  Sponsor?: No

## 2020-04-21 MED FILL — TRUE METRIX GLUCOSE TEST ST: 25 days supply | Qty: 50 | Fill #2

## 2020-04-29 ENCOUNTER — Other Ambulatory Visit: Payer: Self-pay | Admitting: Critical Care Medicine

## 2020-04-29 ENCOUNTER — Telehealth (HOSPITAL_COMMUNITY): Payer: Self-pay | Admitting: *Deleted

## 2020-04-29 MED FILL — $LANTUS SOLOSTAR 100 UNITS/: 100 | 20 days supply | Qty: 3 | Fill #0

## 2020-04-29 NOTE — Telephone Encounter (Signed)
Office is closed to in person patients today, but nursing staff here as we cant work from home. He showed up for his scheduled group appt today with [facility], will let her know he came in and was sent away.

## 2020-05-01 ENCOUNTER — Ambulatory Visit: Payer: Self-pay | Attending: Critical Care Medicine | Admitting: Physician Assistant

## 2020-05-01 ENCOUNTER — Encounter: Payer: Self-pay | Admitting: Physician Assistant

## 2020-05-01 ENCOUNTER — Other Ambulatory Visit: Payer: Self-pay

## 2020-05-01 ENCOUNTER — Other Ambulatory Visit: Payer: Self-pay | Admitting: Physician Assistant

## 2020-05-01 VITALS — BP 129/64 | HR 98 | Temp 98.7°F | Ht 72.0 in | Wt 174.0 lb

## 2020-05-01 DIAGNOSIS — I10 Essential (primary) hypertension: Secondary | ICD-10-CM

## 2020-05-01 DIAGNOSIS — F332 Major depressive disorder, recurrent severe without psychotic features: Secondary | ICD-10-CM

## 2020-05-01 DIAGNOSIS — L84 Corns and callosities: Secondary | ICD-10-CM

## 2020-05-01 DIAGNOSIS — E1165 Type 2 diabetes mellitus with hyperglycemia: Secondary | ICD-10-CM

## 2020-05-01 DIAGNOSIS — E7800 Pure hypercholesterolemia, unspecified: Secondary | ICD-10-CM

## 2020-05-01 LAB — POCT GLYCOSYLATED HEMOGLOBIN (HGB A1C): HbA1c, POC (controlled diabetic range): 9.8 % — AB (ref 0.0–7.0)

## 2020-05-01 LAB — GLUCOSE, POCT (MANUAL RESULT ENTRY): POC Glucose: 235 mg/dl — AB (ref 70–99)

## 2020-05-01 MED ORDER — LISINOPRIL 10 MG PO TABS: 10.0000 mg | ORAL_TABLET | Freq: Every day | ORAL | 1 refills | Status: DC

## 2020-05-01 MED ORDER — LANTUS SOLOSTAR 100 UNIT/ML ~~LOC~~ SOPN: 15.0000 [IU] | PEN_INJECTOR | Freq: Every day | SUBCUTANEOUS | 2 refills | Status: DC

## 2020-05-01 MED ORDER — BUPROPION HCL ER (XL) 150 MG PO TB24: 150.0000 mg | ORAL_TABLET | Freq: Every day | ORAL | 1 refills | Status: DC

## 2020-05-01 MED ORDER — ATORVASTATIN CALCIUM 40 MG PO TABS: ORAL_TABLET | ORAL | 1 refills | Status: DC

## 2020-05-01 MED ORDER — EMPAGLIFLOZIN 10 MG PO TABS: 10.0000 mg | ORAL_TABLET | Freq: Every day | ORAL | 1 refills | Status: DC

## 2020-05-01 MED ORDER — METFORMIN HCL 1000 MG PO TABS: ORAL_TABLET | ORAL | 0 refills | Status: DC

## 2020-05-01 NOTE — Progress Notes (Signed)
James [name], is a 60 y.o. male  TDH:[id number]  MIW:[id number]  DOB - Oct 01, 1960  Subjective:  Chief Complaint and HPI: James [name] is a 60 y.o. male here today  For pain in between his R 4th and 5th toes.  Worse at work and when he has to wear his steel toe boots.  Says blood sugars have been under 200 and had 78 a couple of times and decreased his lantus from 20 to 15 units.  He felt ok at the time his blood sugar was 78.  He is continuing to follow with [facility] and has been clean/sober for 8 months!! ROS:   Constitutional:  No f/c, No night sweats, No unexplained weight loss. EENT:  No vision changes, No blurry vision, No hearing changes. No mouth, throat, or ear problems.  Respiratory: No cough, No SOB Cardiac: No CP, no palpitations GI:  No abd pain, No N/V/D. GU: No Urinary s/sx Musculoskeletal: No joint pain Neuro: No headache, no dizziness, no motor weakness.  Skin: No rash Endocrine:  No polydipsia. No polyuria.  Psych: Denies SI/HI  No problems updated.  ALLERGIES: No Known Allergies  PAST MEDICAL HISTORY: Past Medical History:  Diagnosis Date  . Cellulitis and abscess of foot 09/29/2016  . Chronic systolic heart failure ([place])   . Cocaine abuse ([place])   . Cocaine dependence with cocaine-induced mood disorder ([place])   . Depression   . Diabetes mellitus without complication ([place])   . Essential hypertension   . NICM (nonischemic cardiomyopathy) ([place]) EF 20-25%   . Suicidal ideations 07/12/2017    MEDICATIONS AT HOME: Prior to Admission medications   Medication Sig Start Date End Date Taking? Authorizing Provider  blood glucose meter kit and supplies Dispense based on patient and insurance preference. Use up to four times daily as directed. (FOR ICD-9 250.00, 250.01). 07/17/19  Yes Mayers, Cari S, PA-C  Blood Glucose Monitoring Suppl (TRUE METRIX METER) w/Device KIT Use to measure blood sugar twice a day 12/11/19  Yes Elsie Stain, MD  cholecalciferol  (VITAMIN D3) 25 MCG (1000 UNIT) tablet Take 1,000 Units by mouth daily.   Yes [provider]  glucose blood (TRUE METRIX BLOOD GLUCOSE TEST) test strip Use as instructed 12/11/19  Yes Elsie Stain, MD  Insulin Syringe 27G X 1/2" 0.5 ML MISC Use with lantus 12/11/19  Yes Elsie Stain, MD  sildenafil (VIAGRA) 100 MG tablet Take 0.5-1 tablets (50-100 mg total) by mouth daily as needed for erectile dysfunction. 12/27/19  Yes Elsie Stain, MD  TRUEplus Lancets 28G MISC Use to measure blood sugar twice a day 12/11/19  Yes Elsie Stain, MD  aspirin EC 81 MG tablet Take 1 tablet (81 mg total) by mouth daily. Swallow whole. Patient not taking: Reported on 05/01/2020 02/05/20   Lelon Perla, MD  atorvastatin (LIPITOR) 40 MG tablet TAKE 1 TABLET BY MOUTH DAILY FOR HIGH CHOLESTEROL 05/01/20   Freeman Caldron M, PA-C  buPROPion (WELLBUTRIN XL) 150 MG 24 hr tablet Take 1 tablet (150 mg total) by mouth daily. 05/01/20   Argentina Donovan, PA-C  empagliflozin (JARDIANCE) 10 MG TABS tablet Take 1 tablet (10 mg total) by mouth daily before breakfast. 05/01/20   Thereasa Solo, Dionne Bucy, PA-C  insulin glargine (LANTUS SOLOSTAR) 100 UNIT/ML Solostar Pen Inject 15 Units into the skin at bedtime. 05/01/20   Argentina Donovan, PA-C  lisinopril (ZESTRIL) 10 MG tablet Take 1 tablet (10 mg total) by mouth daily. 05/01/20  Freeman Caldron M, PA-C  metFORMIN (GLUCOPHAGE) 1000 MG tablet TAKE 1 TABLET BY MOUTH TWICE DAILY WITH A MEAL FOR DIABETES MANAGMEMENT 05/01/20   Argentina Donovan, PA-C     Objective:  EXAM:   Vitals:   05/01/20 1612  BP: 129/64  Pulse: 98  Temp: 98.7 F (37.1 C)  TempSrc: Oral  SpO2: 98%  Weight: 174 lb (78.9 kg)  Height: 6' (1.829 m)    General appearance : A&OX3. NAD. Non-toxic-appearing HEENT: Atraumatic and Normocephalic.  PERRLA. EOM intact.   Chest/Lungs:  Breathing-non-labored, Good air entry bilaterally, breath sounds normal without rales, rhonchi, or wheezing   CVS: S1 S2 regular, no murmurs, gallops, rubs  Extremities: Bilateral Lower Ext shows no edema, both legs are warm to touch with = pulse throughout.  B feet with good circulation-there is a corn bt his 4th and 5th toe on the R Neurology:  CN II-XII grossly intact, Non focal.   Psych:  TP linear. J/I WNL. Normal speech. Appropriate eye contact and affect.  Skin:  No Rash  Data Review Lab Results  Component Value Date   HGBA1C 9.8 (A) 05/01/2020   HGBA1C 11.7 (A) 12/11/2019   HGBA1C 13.1 (A) 07/17/2019     Assessment & Plan   1. Uncontrolled type 2 diabetes mellitus with hyperglycemia (HCC) Improving-he can do 15-18 units on the lantus.  His blood sugar has continued to improve since being sober as well.   - Glucose (CBG) - HgB A1c - Comprehensive metabolic panel - metFORMIN (GLUCOPHAGE) 1000 MG tablet; TAKE 1 TABLET BY MOUTH TWICE DAILY WITH A MEAL FOR DIABETES MANAGMEMENT  Dispense: 180 tablet; Refill: 0 - empagliflozin (JARDIANCE) 10 MG TABS tablet; Take 1 tablet (10 mg total) by mouth daily before breakfast.  Dispense: 30 tablet; Refill: 1 - insulin glargine (LANTUS SOLOSTAR) 100 UNIT/ML Solostar Pen; Inject 15 Units into the skin at bedtime.  Dispense: 15 mL; Refill: 2 - Ambulatory referral to Podiatry  2. Essential hypertension controlled - lisinopril (ZESTRIL) 10 MG tablet; Take 1 tablet (10 mg total) by mouth daily.  Dispense: 90 tablet; Refill: 1  3. HYPERCHOLESTEROLEMIA - atorvastatin (LIPITOR) 40 MG tablet; TAKE 1 TABLET BY MOUTH DAILY FOR HIGH CHOLESTEROL  Dispense: 90 tablet; Refill: 1  4. Severe episode of recurrent major depressive disorder, without psychotic features ([place]) - buPROPion (WELLBUTRIN XL) 150 MG 24 hr tablet; Take 1 tablet (150 mg total) by mouth daily.  Dispense: 90 tablet; Refill: 1  5. [place] and will refer due to DM - Ambulatory referral to Podiatry   Patient have been counseled extensively about nutrition and exercise  Return in about  3 months (around 07/30/2020) for Dr wright/PCP chronic conditions.  The patient was given clear instructions to go to ER or return to medical center if symptoms don't improve, worsen or new problems develop. The patient verbalized understanding. The patient was told to call to get lab results if they haven't heard anything in the next week.     Freeman Caldron, PA-C [facility] [place], [place]   05/01/2020, 4:47 PMPatient ID: MALIN CERVINI, male   DOB: May 12, 1960, 60 y.o.   MRN: [id number]

## 2020-05-02 LAB — COMPREHENSIVE METABOLIC PANEL
ALT: 22 IU/L (ref 0–44)
AST: 18 IU/L (ref 0–40)
Albumin/Globulin Ratio: 1.3 (ref 1.2–2.2)
Albumin: 3.8 g/dL (ref 3.8–4.8)
Alkaline Phosphatase: 72 IU/L (ref 44–121)
BUN/Creatinine Ratio: 15 (ref 10–24)
BUN: 21 mg/dL (ref 8–27)
Bilirubin Total: <0.2 mg/dL (ref 0.0–1.2)
CO2: 23 mmol/L (ref 20–29)
Calcium: 9.3 mg/dL (ref 8.6–10.2)
Chloride: 103 mmol/L (ref 96–106)
Creatinine, Ser: 1.38 mg/dL — ABNORMAL HIGH (ref 0.76–1.27)
GFR calc Af Amer: 63 mL/min/{1.73_m2} (ref >59)
GFR calc non Af Amer: 55 mL/min/{1.73_m2} — ABNORMAL LOW (ref >59)
Globulin, Total: 3 g/dL (ref 1.5–4.5)
Glucose: 224 mg/dL — ABNORMAL HIGH (ref 65–99)
Potassium: 4.5 mmol/L (ref 3.5–5.2)
Sodium: 141 mmol/L (ref 134–144)
Total Protein: 6.8 g/dL (ref 6.0–8.5)

## 2020-05-13 ENCOUNTER — Other Ambulatory Visit: Payer: Self-pay

## 2020-05-13 ENCOUNTER — Ambulatory Visit (INDEPENDENT_AMBULATORY_CARE_PROVIDER_SITE_OTHER): Payer: No Payment, Other | Admitting: Behavioral Health

## 2020-05-13 DIAGNOSIS — F1420 Cocaine dependence, uncomplicated: Secondary | ICD-10-CM

## 2020-05-14 NOTE — Group Note (Signed)
Group Topic: General Coping Skills  Group Date: 05/13/2020 Start Time: 10:00 AM End Time: 11:00 AM Facilitators: Mamie Nick, Counselor  Department: [facility]  Number of Participants: 1  Group Focus: acceptance, coping skills, family, forgiveness, goals/reality orientation, and relapse prevention Treatment Modality:  Cognitive Behavioral Therapy Interventions utilized were clarification, confrontation, and exploration Purpose: enhance coping skills, explore maladaptive thinking, express feelings, increase insight, relapse prevention strategies, and trigger / craving management   Name: MESHULEM ONORATO Date of Birth: 07-14-1960  MR: [id number]    Level of Participation: active Quality of Participation: attentive, engaged and motivated Interactions with others: gave feedback Mood/Affect: appropriate Triggers (if applicable): N/A Cognition: coherent/clear and insightful Progress: Significant Response: Tuff shared that he recently went to court and his pending legal charges were dropped to probation. Clt was really happy about this news. He shared that he has been maintaining his sobriety by staying mindful of his triggers and stressors. He shared that work has been going well. Antwane reports he recently visited his doctor who reports his A1C number need to improve and he has been instructed to drink more water. Clt shared that he has another pending court date on 05/19/2020 to address his legal charges in [facility]. Clt asked this provider for a letter to confirm his treatment engagement. Clt also willingly provided a urine drug sample during today's session. Clt UDS was negative for all substances.  Plan: follow-up needed  Patients Problems:  Patient Active Problem List   Diagnosis Date Noted  . Vasculogenic erectile dysfunction 01/24/2020  . Tobacco use 12/11/2019  . MDD (major depressive disorder) 01/14/2017  . Cardiomyopathy- suspect NICM but  etiology not yet determined 04/27/2016  . Chronic combined systolic and diastolic heart failure (HCC) 04/27/2016  . Uncontrolled type 2 diabetes mellitus with hyperglycemia (HCC) 04/26/2016  . History of alcohol use 12/12/2015  . History of cocaine abuse (HCC) 06/25/2015  . SPONDYLOSIS, CERVICAL, WITH RADICULOPATHY 02/25/2009  . HYPERCHOLESTEROLEMIA 05/09/2007  . ALLERGIC RHINITIS 12/21/2006  . Essential hypertension 12/07/2006     Family Program: Family present? No   Name of family member(s): N/A  UDS collected: Yes Results: negative  AA/NA attended?: No  Sponsor?: No

## 2020-05-16 MED FILL — $LANTUS SOLOSTAR 100 UNITS/: 100 | 80 days supply | Qty: 12 | Fill #0

## 2020-05-16 MED FILL — LISINOPRIL 10 MG TABS: 10 | 30 days supply | Qty: 30 | Fill #0

## 2020-05-16 MED FILL — ATORVASTATIN CALCIUM 40 MG: 40 | 30 days supply | Qty: 30 | Fill #0

## 2020-05-16 MED FILL — TRUE METRIX GLUCOSE TEST ST: 25 days supply | Qty: 50 | Fill #3

## 2020-05-16 MED FILL — METFORMIN HCL 1000 MG TABS: 1000 | 30 days supply | Qty: 60 | Fill #0

## 2020-05-16 MED FILL — BUPROPION HCL XL 150 MG TAB: 150 | 30 days supply | Qty: 30 | Fill #0

## 2020-05-20 ENCOUNTER — Ambulatory Visit: Payer: Self-pay | Admitting: Critical Care Medicine

## 2020-05-27 ENCOUNTER — Ambulatory Visit (INDEPENDENT_AMBULATORY_CARE_PROVIDER_SITE_OTHER): Payer: No Payment, Other | Admitting: Behavioral Health

## 2020-05-27 ENCOUNTER — Other Ambulatory Visit: Payer: Self-pay

## 2020-05-27 DIAGNOSIS — F1421 Cocaine dependence, in remission: Secondary | ICD-10-CM

## 2020-05-27 NOTE — Group Note (Signed)
Group Topic: General Coping Skills  Group Date: 05/27/2020 Start Time: 10:00 AM End Time: 11:00 AM Facilitators: Mamie Nick, Counselor  Department: [facility]  Number of Participants: 2  Group Focus: acceptance, anxiety, check in, coping skills, family, and relapse prevention Treatment Modality:  Cognitive Behavioral Therapy Interventions utilized were clarification, confrontation, and exploration Purpose: enhance coping skills, explore maladaptive thinking, increase insight, relapse prevention strategies, and trigger / craving management   Name: James [name] Date of Birth: 05/12/1960  MR: [id number]    Level of Participation: active Quality of Participation: attentive and cooperative Interactions with others: gave feedback Mood/Affect: appropriate Triggers (if applicable): None Reported Cognition: coherent/clear and insightful Progress: Significant Response: Clt checked-in by sharing good news that all of his pending charges were dropped. He shared that he is proud of the progress he has made thus far in his recovery. Clt talked about how he manages his triggers and stressors. 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 Clt provided insightful verbal feedback during group discussion. Clt identified his feeling word today as "grateful". Plan: follow-up needed  Patients Problems:  Patient Active Problem List   Diagnosis Date Noted  . Vasculogenic erectile dysfunction 01/24/2020  . Tobacco use 12/11/2019  . MDD (major depressive disorder) 01/14/2017  . Cardiomyopathy- suspect NICM but etiology not yet determined 04/27/2016  . Chronic combined systolic and diastolic heart failure (HCC) 04/27/2016  . Uncontrolled type 2 diabetes mellitus with hyperglycemia (HCC) 04/26/2016  . History of alcohol use 12/12/2015  . History of cocaine abuse (HCC) 06/25/2015  . SPONDYLOSIS, CERVICAL, WITH RADICULOPATHY  02/25/2009  . HYPERCHOLESTEROLEMIA 05/09/2007  . ALLERGIC RHINITIS 12/21/2006  . Essential hypertension 12/07/2006     Family Program: Family present? No   Name of family member(s): N/A  UDS collected: No Results: None  AA/NA attended?: No  Sponsor?: No

## 2020-06-02 MED FILL — SILDENAFIL CITRATE 100 MG T: 100 | 30 days supply | Qty: 10 | Fill #1

## 2020-06-10 ENCOUNTER — Other Ambulatory Visit: Payer: Self-pay

## 2020-06-10 ENCOUNTER — Ambulatory Visit (INDEPENDENT_AMBULATORY_CARE_PROVIDER_SITE_OTHER): Payer: No Payment, Other | Admitting: Behavioral Health

## 2020-06-10 DIAGNOSIS — F1421 Cocaine dependence, in remission: Secondary | ICD-10-CM

## 2020-06-10 MED FILL — LISINOPRIL 10 MG TABS: 10 | 30 days supply | Qty: 30 | Fill #1

## 2020-06-10 MED FILL — ATORVASTATIN CALCIUM 40 MG: 40 | 30 days supply | Qty: 30 | Fill #1

## 2020-06-10 MED FILL — BUPROPION HCL XL 150 MG TAB: 150 | 30 days supply | Qty: 30 | Fill #1

## 2020-06-10 NOTE — Group Note (Signed)
Group Topic: Values  Group Date: 06/10/2020 Start Time: 10:00 AM End Time: 11:00 AM Facilitators: Mamie Nick, Counselor  Department: [facility]  Number of Participants: 1  Group Focus: affirmation, check in, coping skills, family, feeling awareness/expression, forgiveness, leisure skills, relapse prevention, and self-esteem Treatment Modality:  Cognitive Behavioral Therapy Interventions utilized were clarification, confrontation, exploration, and story telling Purpose: enhance coping skills, explore maladaptive thinking, express feelings, regain self-worth, reinforce self-care, relapse prevention strategies, and trigger / craving management   Name: James [name] Date of Birth: 1960/07/03  MR: [id number]    Level of Participation: active Quality of Participation: attentive and cooperative Interactions with others: gave feedback Mood/Affect: appropriate and positive Triggers (if applicable): N/A Cognition: coherent/clear, insightful and logical Progress: Significant Response: Bridger [facility] to [facility] for a scheduled SAOP group therapy session. He checked-in by sharing the he feels "extremely grateful". He shared that he is 26 days away from being able to retire, his birthday is in 26 days, and he is enjoying the time he has been spending with his family. Clt states "I can't explain how happy I am, especially when think about where I was a year ago today". Clt shared his memory of when he was in active addiction, this time last year. He reports having gratitude for his recovery and his ability to maintain his sobriety from drugs and alcohol. Clt engaged well in session today. He thanked the group leader as she provided him with a certificate of completion. Plan: Patient has completed SAOP and is encouraged to schedule f/u outpatient individual appointment once a month for maintenance or as needed.   Patients Problems:  Patient Active Problem List    Diagnosis Date Noted  . Vasculogenic erectile dysfunction 01/24/2020  . Tobacco use 12/11/2019  . MDD (major depressive disorder) 01/14/2017  . Cardiomyopathy- suspect NICM but etiology not yet determined 04/27/2016  . Chronic combined systolic and diastolic heart failure (HCC) 04/27/2016  . Uncontrolled type 2 diabetes mellitus with hyperglycemia (HCC) 04/26/2016  . History of alcohol use 12/12/2015  . History of cocaine abuse (HCC) 06/25/2015  . SPONDYLOSIS, CERVICAL, WITH RADICULOPATHY 02/25/2009  . HYPERCHOLESTEROLEMIA 05/09/2007  . ALLERGIC RHINITIS 12/21/2006  . Essential hypertension 12/07/2006     Family Program: Family present? No   Name of family member(s): N/A  UDS collected: No Results: N/A  AA/NA attended?: No  Sponsor?: No

## 2020-06-11 ENCOUNTER — Ambulatory Visit: Payer: Self-pay | Admitting: Podiatry

## 2020-06-15 NOTE — Group Note (Signed)
Group Topic: Identifying Triggers  Group Date: 02/26/2020 Start Time: 10:00 AM End Time: 11:00 AM Facilitators: Mamie Nick, Counselor  Department: [facility]  Number of Participants: 1  Group Focus: check in, coping skills, depression, family, forgiveness, and relapse prevention Treatment Modality:  Cognitive Behavioral Therapy Interventions utilized were clarification, confrontation, and exploration Purpose: enhance coping skills, explore maladaptive thinking, express feelings, express irrational fears, improve communication skills, increase insight, regain self-worth, reinforce self-care, relapse prevention strategies, and trigger / craving management   Name: James [name] Date of Birth: 1960/09/29  MR: [id number]    Level of Participation: active Quality of Participation: attentive and cooperative Interactions with others: gave feedback Mood/Affect: appropriate and positive Triggers (if applicable): N/A Cognition: coherent/clear Progress: Significant Response: Clt checked-in by sharing that he is "still clean". He shared that he visited his family this past weekend. Clt processed holiday triggers. He reports having plans to spend Thanksgiving holiday at his daughter's house. Clt rated his depression and anxiety as a zero, on a scale 0 to 10 with 10 being the worse. Client was insightful in his feedback.  Plan: follow-up needed  Patients Problems:  Patient Active Problem List   Diagnosis Date Noted  . Vasculogenic erectile dysfunction 01/24/2020  . Tobacco use 12/11/2019  . MDD (major depressive disorder) 01/14/2017  . Cardiomyopathy- suspect NICM but etiology not yet determined 04/27/2016  . Chronic combined systolic and diastolic heart failure (HCC) 04/27/2016  . Uncontrolled type 2 diabetes mellitus with hyperglycemia (HCC) 04/26/2016  . History of alcohol use 12/12/2015  . History of cocaine abuse (HCC) 06/25/2015  . SPONDYLOSIS,  CERVICAL, WITH RADICULOPATHY 02/25/2009  . HYPERCHOLESTEROLEMIA 05/09/2007  . ALLERGIC RHINITIS 12/21/2006  . Essential hypertension 12/07/2006     Family Program: Family present? No   Name of family member(s): N/A  UDS collected: No Results: N/A  AA/NA attended?: No  Sponsor?: No

## 2020-06-19 ENCOUNTER — Encounter (HOSPITAL_COMMUNITY): Payer: Self-pay

## 2020-06-19 ENCOUNTER — Other Ambulatory Visit: Payer: Self-pay

## 2020-06-19 ENCOUNTER — Emergency Department (HOSPITAL_COMMUNITY): Payer: Self-pay

## 2020-06-19 ENCOUNTER — Emergency Department (HOSPITAL_COMMUNITY)
Admission: EM | Admit: 2020-06-19 | Discharge: 2020-06-19 | Disposition: A | Discharge status: Home / Self Care | Payer: Self-pay | Attending: Emergency Medicine | Admitting: Emergency Medicine

## 2020-06-19 DIAGNOSIS — I110 Hypertensive heart disease with heart failure: Secondary | ICD-10-CM | POA: Insufficient documentation

## 2020-06-19 DIAGNOSIS — R000 Tachycardia, unspecified: Secondary | ICD-10-CM | POA: Insufficient documentation

## 2020-06-19 DIAGNOSIS — I509 Heart failure, unspecified: Secondary | ICD-10-CM

## 2020-06-19 DIAGNOSIS — E119 Type 2 diabetes mellitus without complications: Secondary | ICD-10-CM | POA: Insufficient documentation

## 2020-06-19 DIAGNOSIS — Z79899 Other long term (current) drug therapy: Secondary | ICD-10-CM | POA: Insufficient documentation

## 2020-06-19 DIAGNOSIS — Z7984 Long term (current) use of oral hypoglycemic drugs: Secondary | ICD-10-CM | POA: Insufficient documentation

## 2020-06-19 DIAGNOSIS — I5043 Acute on chronic combined systolic (congestive) and diastolic (congestive) heart failure: Secondary | ICD-10-CM | POA: Insufficient documentation

## 2020-06-19 DIAGNOSIS — Z20822 Contact with and (suspected) exposure to covid-19: Secondary | ICD-10-CM | POA: Insufficient documentation

## 2020-06-19 DIAGNOSIS — Z794 Long term (current) use of insulin: Secondary | ICD-10-CM | POA: Insufficient documentation

## 2020-06-19 DIAGNOSIS — Z7982 Long term (current) use of aspirin: Secondary | ICD-10-CM | POA: Insufficient documentation

## 2020-06-19 DIAGNOSIS — F17210 Nicotine dependence, cigarettes, uncomplicated: Secondary | ICD-10-CM | POA: Insufficient documentation

## 2020-06-19 LAB — CBC
HCT: 39.6 % (ref 39.0–52.0)
Hemoglobin: 12.9 g/dL — ABNORMAL LOW (ref 13.0–17.0)
MCH: 27.9 pg (ref 26.0–34.0)
MCHC: 32.6 g/dL (ref 30.0–36.0)
MCV: 85.7 fL (ref 80.0–100.0)
Platelets: 220 10*3/uL (ref 150–400)
RBC: 4.62 MIL/uL (ref 4.22–5.81)
RDW: 14.9 % (ref 11.5–15.5)
WBC: 5.2 10*3/uL (ref 4.0–10.5)
nRBC: 0 % (ref 0.0–0.2)

## 2020-06-19 LAB — HEPATIC FUNCTION PANEL
ALT: 22 U/L (ref 0–44)
AST: 19 U/L (ref 15–41)
Albumin: 3.4 g/dL — ABNORMAL LOW (ref 3.5–5.0)
Alkaline Phosphatase: 64 U/L (ref 38–126)
Bilirubin, Direct: <0.1 mg/dL (ref 0.0–0.2)
Total Bilirubin: 0.6 mg/dL (ref 0.3–1.2)
Total Protein: 5.8 g/dL — ABNORMAL LOW (ref 6.5–8.1)

## 2020-06-19 LAB — BASIC METABOLIC PANEL
Anion gap: 8 (ref 5–15)
BUN: 13 mg/dL (ref 8–23)
CO2: 26 mmol/L (ref 22–32)
Calcium: 9.3 mg/dL (ref 8.9–10.3)
Chloride: 103 mmol/L (ref 98–111)
Creatinine, Ser: 1.05 mg/dL (ref 0.61–1.24)
GFR, Estimated: >60 mL/min (ref >60)
Glucose, Bld: 214 mg/dL — ABNORMAL HIGH (ref 70–99)
Potassium: 4.1 mmol/L (ref 3.5–5.1)
Sodium: 137 mmol/L (ref 135–145)

## 2020-06-19 LAB — RESP PANEL BY RT-PCR (FLU A&B, COVID) ARPGX2
Influenza A by PCR: NEGATIVE
Influenza B by PCR: NEGATIVE
SARS Coronavirus 2 by RT PCR: NEGATIVE

## 2020-06-19 LAB — TROPONIN I (HIGH SENSITIVITY)
Troponin I (High Sensitivity): 15 ng/L (ref <18)
Troponin I (High Sensitivity): 13 ng/L (ref <18)

## 2020-06-19 LAB — BRAIN NATRIURETIC PEPTIDE: B Natriuretic Peptide: 175 pg/mL — ABNORMAL HIGH (ref 0.0–100.0)

## 2020-06-19 LAB — D-DIMER, QUANTITATIVE: D-Dimer, Quant: 0.41 ug/mL-FEU (ref 0.00–0.50)

## 2020-06-19 MED ORDER — FUROSEMIDE 20 MG PO TABS: 20.0000 mg | ORAL_TABLET | Freq: Every day | ORAL | 0 refills | Status: DC

## 2020-06-19 MED ORDER — FUROSEMIDE 10 MG/ML IJ SOLN
20.0000 mg | Freq: Once | INTRAMUSCULAR | Status: AC
  Administered 2020-06-19: 20 mg via INTRAVENOUS
  Filled 2020-06-19: qty 2

## 2020-06-19 MED FILL — FUROSEMIDE 20 MG TABS: 20 | 7 days supply | Qty: 7 | Fill #0

## 2020-06-19 NOTE — Discharge Instructions (Addendum)
Your work-up today showed that you have too much fluid on your heart and lungs, this is coming from your heart failure. Take Lasix daily for the next week. Your work-up did not show any sign of active heart damage such as a heart attack or any concern for PE (blood clot in your lungs).  It is extremely important that you follow-up with Dr. Jens Som with cardiology for further management of your symptoms, as you may need to be on Lasix all the time. Return to the emergency room if develop fevers, chest pain, worsening breathing, or any new, worsening, or concerning symptoms.

## 2020-06-19 NOTE — ED Provider Notes (Signed)
[facility] Provider Note   CSN: [id number] Arrival date & time: 06/19/20  9702     History Chief Complaint  Patient presents with  . Chest Pain    James [name] is a 60 y.o. male presenting for evaluation of sob and cough.  Patient states that symptoms began approximate 5 days ago.  It began when he woke in the morning and felt he could not breathe.  This improved when he sat up.  He continues to have orthopnea and dyspnea on exertion.  He has a nonproductive cough has been present for the past few days.  He has associated nasal congestion.  He denies fevers, chest pain/tightness/pressure, nausea, vomiting, abdominal pain, change in urination or bowel movements.  He denies leg pain or swelling.  He does report his abdomen feels slightly more bloated than baseline.  He reports a history of heart failure, but is not currently on Lasix or other diuretics.  He does not currently follow with a cardiologist.  He is on lisinopril, no other medications for his heart.  He denies recent travel, surgeries, or prolonged immobilization.  He does continue to smoke about 1/3 pack of cigarettes a day.  He is not on any anticoagulation or aspirin.  History of diabetes on insulin.  History of nonischemic cardiomyopathy.  Previous history of polysubstance abuse, he has been clean for about 10 months.  Additional history obtained from chart review.  History of CHF, diabetes, and ICM.  Last EF per echo in 2018 was 55 to 60%.  Last cath in 2018 showed 50% stenosis of LAD, otherwise normal cath. His last cardiology visit was with Dr. Stanford Breed in 01/2020  HPI     Past Medical History:  Diagnosis Date  . Cellulitis and abscess of foot 09/29/2016  . Chronic systolic heart failure ([place])   . Cocaine abuse ([place])   . Cocaine dependence with cocaine-induced mood disorder ([place])   . Depression   . Diabetes mellitus without complication ([place])   . Essential hypertension   .  NICM (nonischemic cardiomyopathy) ([place]) EF 20-25%   . Suicidal ideations 07/12/2017    Patient Active Problem List   Diagnosis Date Noted  . Vasculogenic erectile dysfunction 01/24/2020  . Tobacco use 12/11/2019  . MDD (major depressive disorder) 01/14/2017  . Cardiomyopathy- suspect NICM but etiology not yet determined 04/27/2016  . Chronic combined systolic and diastolic heart failure ([place]) 04/27/2016  . Uncontrolled type 2 diabetes mellitus with hyperglycemia ([place]) 04/26/2016  . History of alcohol use 12/12/2015  . History of cocaine abuse ([place]) 06/25/2015  . SPONDYLOSIS, CERVICAL, WITH RADICULOPATHY 02/25/2009  . HYPERCHOLESTEROLEMIA 05/09/2007  . ALLERGIC RHINITIS 12/21/2006  . Essential hypertension 12/07/2006    Past Surgical History:  Procedure Laterality Date  . CARDIAC CATHETERIZATION N/A 04/30/2016   Procedure: Right/Left Heart Cath and Coronary Angiography;  Surgeon: Nelva Bush, MD;  Location: [facility];  Service: Cardiovascular;  Laterality: N/A;  . FINGER SURGERY         Family History  Problem Relation Age of Onset  . Diabetes Other   . Hypertension Other   . Diabetes Mother   . Brain cancer Father   . Mental illness Brother   . Drug abuse Brother   . Diabetes Brother     Social History   Tobacco Use  . Smoking status: Current Every Day Smoker    Packs/day: 0.50    Years: 23.00    Pack years: 11.50  Types: Cigarettes  . Smokeless tobacco: Never Used  . Tobacco comment: Every other day   Vaping Use  . Vaping Use: Never used  Substance Use Topics  . Alcohol use: Yes  . Drug use: Not Currently    Types: Cocaine, "Crack" cocaine    Home Medications Prior to Admission medications   Medication Sig Start Date End Date Taking? Authorizing Provider  furosemide (LASIX) 20 MG tablet Take 1 tablet (20 mg total) by mouth daily for 7 days. 06/19/20 06/26/20 Yes Kellina Dreese, PA-C  aspirin EC 81 MG tablet Take 1 tablet (81 mg total) by  mouth daily. Swallow whole. Patient not taking: Reported on 05/01/2020 02/05/20   Lelon Perla, MD  atorvastatin (LIPITOR) 40 MG tablet TAKE 1 TABLET BY MOUTH DAILY FOR HIGH CHOLESTEROL 05/01/20   Freeman Caldron M, PA-C  blood glucose meter kit and supplies Dispense based on patient and insurance preference. Use up to four times daily as directed. (FOR ICD-9 250.00, 250.01). 07/17/19   Mayers, Cari S, PA-C  Blood Glucose Monitoring Suppl (TRUE METRIX METER) w/Device KIT Use to measure blood sugar twice a day 12/11/19   Elsie Stain, MD  buPROPion (WELLBUTRIN XL) 150 MG 24 hr tablet Take 1 tablet (150 mg total) by mouth daily. 05/01/20   Argentina Donovan, PA-C  cholecalciferol (VITAMIN D3) 25 MCG (1000 UNIT) tablet Take 1,000 Units by mouth daily.    [provider]  empagliflozin (JARDIANCE) 10 MG TABS tablet Take 1 tablet (10 mg total) by mouth daily before breakfast. 05/01/20   Argentina Donovan, PA-C  glucose blood (TRUE METRIX BLOOD GLUCOSE TEST) test strip Use as instructed 12/11/19   Elsie Stain, MD  insulin glargine (LANTUS SOLOSTAR) 100 UNIT/ML Solostar Pen Inject 15 Units into the skin at bedtime. 05/01/20   Argentina Donovan, PA-C  Insulin Syringe 27G X 1/2" 0.5 ML MISC Use with lantus 12/11/19   Elsie Stain, MD  lisinopril (ZESTRIL) 10 MG tablet Take 1 tablet (10 mg total) by mouth daily. 05/01/20   Argentina Donovan, PA-C  metFORMIN (GLUCOPHAGE) 1000 MG tablet TAKE 1 TABLET BY MOUTH TWICE DAILY WITH A MEAL FOR DIABETES MANAGMEMENT 05/01/20   Argentina Donovan, PA-C  sildenafil (VIAGRA) 100 MG tablet Take 0.5-1 tablets (50-100 mg total) by mouth daily as needed for erectile dysfunction. 12/27/19   Elsie Stain, MD  TRUEplus Lancets 28G MISC Use to measure blood sugar twice a day 12/11/19   Elsie Stain, MD    Allergies    Patient has no known allergies.  Review of Systems   Review of Systems  Respiratory: Positive for cough and shortness of breath.    Gastrointestinal: Positive for abdominal distention.  All other systems reviewed and are negative.   Physical Exam Updated Vital Signs BP (!) 146/82 (BP Location: Right Arm)   Pulse (!) 53   Temp 98.6 F (37 C) (Oral)   Resp (!) 30   Ht 6' (1.829 m)   Wt 74.8 kg   SpO2 98%   BMI 22.38 kg/m   Physical Exam Vitals and nursing note reviewed.  Constitutional:      General: He is not in acute distress.    Appearance: He is well-developed.     Comments: Resting in the bed in NAD  HENT:     Head: Normocephalic and atraumatic.  Eyes:     Extraocular Movements: Extraocular movements intact.     Conjunctiva/sclera: Conjunctivae normal.  Pupils: Pupils are equal, round, and reactive to light.  Cardiovascular:     Rate and Rhythm: Regular rhythm. Tachycardia present.     Pulses: Normal pulses.     Comments: tachycardic arouns 105 Pulmonary:     Effort: Pulmonary effort is normal. No tachypnea, accessory muscle usage or respiratory distress.     Breath sounds: Examination of the right-lower field reveals rhonchi. Examination of the left-lower field reveals rhonchi. Rhonchi present. No wheezing.     Comments: Minimal crackles hear in bilaterally lower bases.  Speaking in full sentences.  Sats stable on room air.  No accessory muscle use or signs of respiratory distress. Dry cough noted on exam Abdominal:     General: There is no distension.     Palpations: Abdomen is soft. There is no mass.     Tenderness: There is no abdominal tenderness. There is no guarding or rebound.     Comments: No significant distention.  No tenderness palpation.  No rigidity or guarding.  Musculoskeletal:        General: Normal range of motion.     Cervical back: Normal range of motion and neck supple.     Right lower leg: No edema.     Left lower leg: No edema.     Comments: No lower leg swelling  Skin:    General: Skin is warm and dry.     Capillary Refill: Capillary refill takes less than 2  seconds.  Neurological:     Mental Status: He is alert and oriented to person, place, and time.     ED Results / Procedures / Treatments   Labs (all labs ordered are listed, but only abnormal results are displayed) Labs Reviewed  BASIC METABOLIC PANEL - Abnormal; Notable for the following components:      Result Value   Glucose, Bld 214 (*)    All other components within normal limits  CBC - Abnormal; Notable for the following components:   Hemoglobin 12.9 (*)    All other components within normal limits  BRAIN NATRIURETIC PEPTIDE - Abnormal; Notable for the following components:   B Natriuretic Peptide 175.0 (*)    All other components within normal limits  HEPATIC FUNCTION PANEL - Abnormal; Notable for the following components:   Total Protein 5.8 (*)    Albumin 3.4 (*)    All other components within normal limits  RESP PANEL BY RT-PCR (FLU A&B, COVID) ARPGX2  D-DIMER, QUANTITATIVE  TROPONIN I (HIGH SENSITIVITY)  TROPONIN I (HIGH SENSITIVITY)    EKG EKG Interpretation  Date/Time:  Thursday June 19 2020 06:44:43 EST Ventricular Rate:  106 PR Interval:  164 QRS Duration: 84 QT Interval:  354 QTC Calculation: 470 R Axis:   97 Text Interpretation: Sinus tachycardia Rightward axis Nonspecific T wave abnormality Abnormal ECG No significant change since last tracing Confirmed by Gareth Morgan [phone]) on 06/19/2020 7:34:57 AM   Radiology DG Chest 2 View  Result Date: 06/19/2020 CLINICAL DATA:  Chest pain EXAM: CHEST - 2 VIEW COMPARISON:  09/05/2019 FINDINGS: The lungs are symmetrically well expanded. There is engorgement of the central pulmonary vasculature and pulmonary vascular redistribution of the lung apices in keeping with early cardiogenic failure. No overt pulmonary edema. Small right pleural effusion. No pneumothorax. Cardiac size within normal limits. No acute bone abnormality. IMPRESSION: Pulmonary vascular engorgement and small pleural effusion in keeping with  changes of early cardiogenic failure. Electronically Signed   By: Fidela Salisbury MD   On: 06/19/2020 06:58  Procedures Procedures   Medications Ordered in ED Medications  furosemide (LASIX) injection 20 mg (20 mg Intravenous Given 06/19/20 0846)    ED Course  I have reviewed the triage vital signs and the nursing notes.  Pertinent labs & imaging results that were available during my care of the patient were reviewed by me and considered in my medical decision making (see chart for details).    MDM Rules/Calculators/A&P                          Patient presenting for evaluation of orthopnea, dyspnea on exertion, and cough.  On exam, patient peers nontoxic.  He does not have any infectious symptoms including fever.  Low suspicion for bacterial infection such as pneumonia.  Fever worsening heart failure, patient Diuretics.  Less likely ACS, patient without chest pain.  Less likely PE, patient without risk factors.  Will obtain labs, chest x-ray, EKG.  Will treat with Lasix and continue to monitor.  Labs interpreted by me, overall reassuring.  No acidosis.  Electrolytes stable.  BNP minimally elevated at 175, however improved from several years ago.  Troponin normal at 15.  Will obtain repeat troponin due to his risk factors.  Chest x-ray viewed interpreted by me, does show mild worsening cardiomegaly and small pleural effusion.  EKG unchanged from previous.  Repeat troponin stable.  On reassessment after Lasix, patient reports improvement of symptoms.  Has ambulated without shortness of breath or hypoxia.  However he continues to remain tachycardic.  While I have low suspicion for PE, the setting of tachycardia will obtain D-dimer to ensure no PE.  D-dimer negative.  Discussed with attending, Dr. Billy Fischer evaluated the patient.  As patient continues to feel better, and with fluid noted on his x-ray, likely heart failure exacerbation.  Will have patient treat with Lasix at home, close  follow-up with cardiology.  At this time, patient appears safe discharge.  Return precautions given.  Patient states he understands agrees to plan  Final Clinical Impression(s) / ED Diagnoses Final diagnoses:  Acute on chronic congestive heart failure, unspecified heart failure type [facility])    Rx / DC Orders ED Discharge Orders         Ordered    furosemide (LASIX) 20 MG tablet  Daily        06/19/20 1347           Franchot Heidelberg, PA-C 06/19/20 1432    Gareth Morgan, MD 06/20/20 2229

## 2020-06-19 NOTE — ED Triage Notes (Addendum)
Pt c/o CP and SOB x 5 days. Also c/o chronic cough. Pt states has hx of CHF. 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

## 2020-06-19 NOTE — ED Notes (Signed)
Ambulated patient in the hallway o2 99%SATS. ROOM AIR.BACK TO THE BED.HEARTRATE IS 108

## 2020-06-19 NOTE — ED Notes (Signed)
Lab to add on BNP.  °

## 2020-06-23 ENCOUNTER — Ambulatory Visit (INDEPENDENT_AMBULATORY_CARE_PROVIDER_SITE_OTHER): Payer: Self-pay | Admitting: Cardiology

## 2020-06-23 ENCOUNTER — Encounter: Payer: Self-pay | Admitting: Cardiology

## 2020-06-23 ENCOUNTER — Other Ambulatory Visit: Payer: Self-pay

## 2020-06-23 ENCOUNTER — Other Ambulatory Visit: Payer: Self-pay | Admitting: Cardiology

## 2020-06-23 VITALS — BP 130/88 | HR 90 | Ht 72.0 in | Wt 164.0 lb

## 2020-06-23 DIAGNOSIS — I420 Dilated cardiomyopathy: Secondary | ICD-10-CM

## 2020-06-23 DIAGNOSIS — I5042 Chronic combined systolic (congestive) and diastolic (congestive) heart failure: Secondary | ICD-10-CM

## 2020-06-23 DIAGNOSIS — I10 Essential (primary) hypertension: Secondary | ICD-10-CM

## 2020-06-23 DIAGNOSIS — E7800 Pure hypercholesterolemia, unspecified: Secondary | ICD-10-CM

## 2020-06-23 MED ORDER — FUROSEMIDE 20 MG PO TABS: 20.0000 mg | ORAL_TABLET | Freq: Every day | ORAL | 3 refills | Status: DC

## 2020-06-23 NOTE — Progress Notes (Signed)
HPI: FU cardiomyopathy and congestive heart failure.  Patient admitted in January 2018 with dyspnea felt to be a combination of pneumonia and congestive heart failure. Echocardiogram showed severely reduced LV function with ejection fraction 20-25%, restrictive filling, mild left ventricular enlargement, mild to moderate mitral regurgitation and moderately elevated pulmonary pressures. Cardiac catheterization January 2018 showed 50% LAD but otherwise no obstructive coronary disease. Patient was treated with antibiotics and diuretics with improvement.ABIs June 2018 normal.Follow-up echocardiogram July 2018 showed normal LV systolic function and grade 1 diastolic dysfunction. Echocardiogram ordered at last office visit but not performed.  Patient seen in the emergency room March 2022 with dyspnea.  Chest x-ray showed small pleural effusion and vascular engorgement.  Troponin and D-dimer normal.  BNP 175.  Patient given Lasix with improvement in symptoms.  Since last seen, patient was having orthopnea.  There was no chest pain, palpitations, syncope or pedal edema.  Since starting his Lasix his symptoms have improved.  Current Outpatient Medications  Medication Sig Dispense Refill  . aspirin 81 MG chewable tablet Chew 81 mg by mouth daily. 1Daily    . atorvastatin (LIPITOR) 40 MG tablet TAKE 1 TABLET BY MOUTH DAILY FOR HIGH CHOLESTEROL 90 tablet 1  . blood glucose meter kit and supplies Dispense based on patient and insurance preference. Use up to four times daily as directed. (FOR ICD-9 250.00, 250.01). 1 each 0  . Blood Glucose Monitoring Suppl (TRUE METRIX METER) w/Device KIT Use to measure blood sugar twice a day 1 kit 0  . buPROPion (WELLBUTRIN XL) 150 MG 24 hr tablet Take 1 tablet (150 mg total) by mouth daily. 90 tablet 1  . cholecalciferol (VITAMIN D3) 25 MCG (1000 UNIT) tablet Take 1,000 Units by mouth daily.    . empagliflozin (JARDIANCE) 10 MG TABS tablet Take 1 tablet (10 mg total) by  mouth daily before breakfast. 30 tablet 1  . furosemide (LASIX) 20 MG tablet Take 1 tablet (20 mg total) by mouth daily for 7 days. 7 tablet 0  . glucose blood (TRUE METRIX BLOOD GLUCOSE TEST) test strip Use as instructed 100 each 12  . insulin glargine (LANTUS SOLOSTAR) 100 UNIT/ML Solostar Pen Inject 15 Units into the skin at bedtime. 15 mL 2  . Insulin Syringe 27G X 1/2" 0.5 ML MISC Use with lantus 100 each 1  . lisinopril (ZESTRIL) 10 MG tablet Take 1 tablet (10 mg total) by mouth daily. 90 tablet 1  . metFORMIN (GLUCOPHAGE) 1000 MG tablet TAKE 1 TABLET BY MOUTH TWICE DAILY WITH A MEAL FOR DIABETES MANAGMEMENT 180 tablet 0  . sildenafil (VIAGRA) 100 MG tablet Take 0.5-1 tablets (50-100 mg total) by mouth daily as needed for erectile dysfunction. 5 tablet 11  . TRUEplus Lancets 28G MISC Use to measure blood sugar twice a day 100 each 1   No current facility-administered medications for this visit.     Past Medical History:  Diagnosis Date  . Cellulitis and abscess of foot 09/29/2016  . Chronic systolic heart failure ([place])   . Cocaine abuse ([place])   . Cocaine dependence with cocaine-induced mood disorder ([place])   . Depression   . Diabetes mellitus without complication ([place])   . Essential hypertension   . NICM (nonischemic cardiomyopathy) ([place]) EF 20-25%   . Suicidal ideations 07/12/2017    Past Surgical History:  Procedure Laterality Date  . CARDIAC CATHETERIZATION N/A 04/30/2016   Procedure: Right/Left Heart Cath and Coronary Angiography;  Surgeon: Nelva Bush, MD;  Location:  [facility];  Service: Cardiovascular;  Laterality: N/A;  . FINGER SURGERY      Social History   Socioeconomic History  . Marital status: Married    Spouse name: Not on file  . Number of children: Not on file  . Years of education: Not on file  . Highest education level: Not on file  Occupational History  . Not on file  Tobacco Use  . Smoking status: Current Every Day Smoker    Packs/day:  0.50    Years: 23.00    Pack years: 11.50    Types: Cigarettes  . Smokeless tobacco: Never Used  . Tobacco comment: Every other day   Vaping Use  . Vaping Use: Never used  Substance and Sexual Activity  . Alcohol use: Yes  . Drug use: Not Currently    Types: Cocaine, "Crack" cocaine  . Sexual activity: Not Currently  Other Topics Concern  . Not on file  Social History Narrative  . Not on file   Social Determinants of Health   Financial Resource Strain: Not on file  Food Insecurity: Not on file  Transportation Needs: Not on file  Physical Activity: Not on file  Stress: Not on file  Social Connections: Not on file  Intimate Partner Violence: Not on file    Family History  Problem Relation Age of Onset  . Diabetes Other   . Hypertension Other   . Diabetes Mother   . Brain cancer Father   . Mental illness Brother   . Drug abuse Brother   . Diabetes Brother     ROS: no fevers or chills, productive cough, hemoptysis, dysphasia, odynophagia, melena, hematochezia, dysuria, hematuria, rash, seizure activity, orthopnea, PND, pedal edema, claudication. Remaining systems are negative.  Physical Exam: Well-developed well-nourished in no acute distress.  Skin is warm and dry.  HEENT is normal.  Neck is supple.  Chest is clear to auscultation with normal expansion.  Cardiovascular exam is regular rate and rhythm.  Abdominal exam nontender or distended. No masses palpated. Extremities show no edema. neuro grossly intact  ECG-June 19, 2020-sinus tachycardia, left ventricular hypertrophy, nonspecific ST changes.  Personally reviewed  A/P  1 nonischemic cardiomyopathy-plan to continue lisinopril.  We will again order repeat echocardiogram to reassess LV function.  If diminished will need to resume beta-blockade.  2 coronary artery disease-nonobstructive on most recent catheterization.  Continue aspirin and statin.  3 chronic diastolic congestive heart failure-symptoms have  improved.  We will continue Lasix 20 mg daily.  Check potassium and renal function.  4 hypertension-blood pressure controlled.  Continue present medications.  5 hyperlipidemia-continue statin.  6 history of cocaine abuse-he denies relapse.  7 tobacco abuse-patient counseled on discontinuing.  Kirk Ruths, MD

## 2020-06-23 NOTE — Patient Instructions (Signed)
Medication Instructions:   TAKE FUROSEMIDE 20 MG ONCE DAILY  *If you need a refill on your cardiac medications before your next appointment, please call your pharmacy*  Testing/Procedures:  Your physician has requested that you have an echocardiogram. Echocardiography is a painless test that uses sound waves to create images of your heart. It provides your doctor with information about the size and shape of your heart and how well your heart's chambers and valves are working. This procedure takes approximately one hour. There are no restrictions for this procedure.[address]     Follow-Up: At [facility], you and your health needs are our priority.  As part of our continuing mission to provide you with exceptional heart care, we have created designated Provider Care Teams.  These Care Teams include your primary Cardiologist (physician) and Advanced Practice Providers (APPs -  Physician Assistants and Nurse Practitioners) who all work together to provide you with the care you need, when you need it.  We recommend signing up for the patient portal called "MyChart".  Sign up information is provided on this After Visit Summary.  MyChart is used to connect with patients for Virtual Visits (Telemedicine).  Patients are able to view lab/test results, encounter notes, upcoming appointments, etc.  Non-urgent messages can be sent to your provider as well.   To learn more about what you can do with MyChart, go to [email or web address].    Your next appointment:   3 month(s)  The format for your next appointment:   In Person  Provider:   Olga Millers, MD

## 2020-06-24 ENCOUNTER — Encounter: Payer: Self-pay | Admitting: *Deleted

## 2020-06-24 LAB — BASIC METABOLIC PANEL
BUN/Creatinine Ratio: 17 (ref 10–24)
BUN: 20 mg/dL (ref 8–27)
CO2: 23 mmol/L (ref 20–29)
Calcium: 9.9 mg/dL (ref 8.6–10.2)
Chloride: 100 mmol/L (ref 96–106)
Creatinine, Ser: 1.19 mg/dL (ref 0.76–1.27)
Glucose: 152 mg/dL — ABNORMAL HIGH (ref 65–99)
Potassium: 4.4 mmol/L (ref 3.5–5.2)
Sodium: 139 mmol/L (ref 134–144)
eGFR: 69 mL/min/{1.73_m2} (ref >59)

## 2020-07-10 MED FILL — BUPROPION HCL XL 150 MG TAB: 150 | 30 days supply | Qty: 30 | Fill #2

## 2020-07-10 MED FILL — LISINOPRIL 10 MG TABS: 10 | 30 days supply | Qty: 30 | Fill #2

## 2020-07-10 MED FILL — SILDENAFIL CITRATE 100 MG T: 100 | 30 days supply | Qty: 10 | Fill #2

## 2020-07-11 MED FILL — TRUE METRIX GLUCOSE TEST ST: 25 days supply | Qty: 50 | Fill #4

## 2020-07-12 ENCOUNTER — Other Ambulatory Visit: Payer: Self-pay

## 2020-07-13 ENCOUNTER — Other Ambulatory Visit: Payer: Self-pay

## 2020-07-16 ENCOUNTER — Other Ambulatory Visit (HOSPITAL_COMMUNITY): Payer: Self-pay

## 2020-07-17 ENCOUNTER — Encounter (HOSPITAL_COMMUNITY): Payer: Self-pay | Admitting: Cardiology

## 2020-07-28 NOTE — Progress Notes (Signed)
Subjective:    Patient ID: James [name]., male    DOB: 1960-07-01, 60 y.o.   MRN: [id number]    History of Present Illness: 12/11/2019 This is a 61 year old male who comes to [facility] wishing to establish for primary care.  He currently is uninsured and now has housing.  Formally he was homeless.  He formally was being seen by Raechel Chute in the [facility] but now that he has housing he is seeking primary care here at the health and wellness center.  Patient has a prior history of hypertension, type 2 diabetes, nonischemic cardiomyopathy, hyperlipidemia, chronic systolic diastolic heart failure, major depression, use of cocaine chronically, history of suicidal ideation.  The patient has been in and out of the criminal justice system as well more recently in May of this year.  At that time while on insulin he felt he had hypoglycemic spells in the jail area.  He says his last cocaine was in PCP to establish needs med refill  Hx HTN, T2DM, NICM, HLD, Chronic systolic/diastolic HF, Major Depression, Hx cocaine use, hx of suicidal ideation  Patient states he is experiencing continued anxiety and depression.  He is requesting an outpatient program from substance use disorder.  He does have a job working in [employer].  The patient did receive his Covid vaccine series ending in May.  He has yet to receive influenza vaccine and is due a tetanus vaccine.  On arrival blood pressure is 157/75.  The patient states he is not been on any of his medications in several weeks as he ran out of his medicines including his insulin and blood pressure heart failure medicines  The patient does not have any any pending appointments with cardiology.  Previously the patient was [facility] in [facility] at the [facility] and was now referred here to primary care for to establish.  On arrival blood sugar 293 and hemoglobin A1c was 11.9.  He is requesting a prescription for erectile dysfunction  Viagra.  He does have very poor dentition.  He is no longer drinking alcohol.  12/27/2019 This is a telephone follow-up visit for this 60 year old male with history of cocaine use, cardiomyopathy, alcohol use, depression, hypercholesterolemia, type 2 diabetes, major depression, history of suicidal ideation and anxiety.  This patient is reached to by phone today and he states he is markedly improved.  His no longer having any chest pain shortness of breath or edema.  He is reduced his tobacco intake to about 1/2 pack a day.  He is no longer taking cocaine and he is in now enrolled in an intensive outpatient program for cocaine use.  He states his blood sugars in the afternoons are 1 68-1 78 mornings are in the 200s his lowest is 127.  He is on the 15 units of Lantus daily along with 1000 mg twice daily Metformin.  He is applying for financial assistance.  He states he still eating quite a bit of carbohydrates throughout the day and has not adopted much in the way of vegetables at this time.  The patient states that he is improved overall and is no longer having anxiety or depression.  Note his hemoglobin A1c was greater than 11 at the last visit.  01/24/20 Patient seen in return follow-up for hypertension and diabetes his blood sugar has been well controlled he states it has been in the low 100 range on his current program he is not had any evidence of hypoglycemia.  He has been compliant with his blood pressure medications.  His largest complaint is that of continued erectile dysfunction.  He wishes to explore that further.  He has been on the Viagra without much improvement.  On arrival blood pressure is 100/60.  4/19 Since the last visit this patient has successfully quit cocaine and is now been free for over 6 months of cocaine the patient does still have elevated blood sugars he could not tolerate the Jardiance as it dropped his blood sugar he is only on the Lantus at this time and metformin. Patient  does need a COVID booster  He has no other complaints.  He is seen recently by cardiology stable they wish him to stay on furosemide.  He is still smoking 1-1/2 packs of cigarettes a week.  On arrival hemoglobin A1c remains elevated at 9.7.  Blood sugar was 234    Past Medical History:  Diagnosis Date  . Cellulitis and abscess of foot 09/29/2016  . Chronic systolic heart failure ([place])   . Cocaine abuse ([place])   . Cocaine dependence with cocaine-induced mood disorder ([place])   . Depression   . Diabetes mellitus without complication ([place])   . Essential hypertension   . NICM (nonischemic cardiomyopathy) ([place]) EF 20-25%   . Suicidal ideations 07/12/2017     Family History  Problem Relation Age of Onset  . Diabetes Other   . Hypertension Other   . Diabetes Mother   . Brain cancer Father   . Mental illness Brother   . Drug abuse Brother   . Diabetes Brother      Social History   Socioeconomic History  . Marital status: Married    Spouse name: Not on file  . Number of children: Not on file  . Years of education: Not on file  . Highest education level: Not on file  Occupational History  . Not on file  Tobacco Use  . Smoking status: Current Every Day Smoker    Packs/day: 0.50    Years: 23.00    Pack years: 11.50    Types: Cigarettes  . Smokeless tobacco: Never Used  . Tobacco comment: Every other day   Vaping Use  . Vaping Use: Never used  Substance and Sexual Activity  . Alcohol use: Yes  . Drug use: Not Currently    Types: Cocaine, "Crack" cocaine  . Sexual activity: Not Currently  Other Topics Concern  . Not on file  Social History Narrative  . Not on file   Social Determinants of Health   Financial Resource Strain: Not on file  Food Insecurity: Not on file  Transportation Needs: Not on file  Physical Activity: Not on file  Stress: Not on file  Social Connections: Not on file  Intimate Partner Violence: Not on file     No Known Allergies   Outpatient  Medications Prior to Visit  Medication Sig Dispense Refill  . aspirin 81 MG chewable tablet Chew 81 mg by mouth daily. 1Daily    . blood glucose meter kit and supplies Dispense based on patient and insurance preference. Use up to four times daily as directed. (FOR ICD-9 250.00, 250.01). 1 each 0  . Blood Glucose Monitoring Suppl (TRUE METRIX METER) w/Device KIT Use to measure blood sugar twice a day 1 kit 0  . cholecalciferol (VITAMIN D3) 25 MCG (1000 UNIT) tablet Take 1,000 Units by mouth daily.    . furosemide (LASIX) 20 MG tablet TAKE 1 TABLET (20 MG TOTAL) BY MOUTH  DAILY. 90 tablet 3  . Insulin Syringe 27G X 1/2" 0.5 ML MISC Use with lantus 100 each 1  . sildenafil (VIAGRA) 100 MG tablet TAKE 0.5-1 TABLETS (50-100 MG TOTAL) BY MOUTH DAILY AS NEEDED FOR ERECTILE DYSFUNCTION. 5 tablet 11  . glucose blood (TRUE METRIX BLOOD GLUCOSE TEST) test strip Use as instructed 100 each 12  . glucose blood test strip USE TO CHECK BLOOD SUGARS 2 TIMES DAILY (Patient taking differently: USE TO CHECK BLOOD SUGARS 2 TIMES DAILY) 100 strip 12  . insulin glargine (LANTUS SOLOSTAR) 100 UNIT/ML Solostar Pen Inject 15 Units into the skin at bedtime. 15 mL 2  . lisinopril (ZESTRIL) 10 MG tablet TAKE 1 TABLET (10 MG TOTAL) BY MOUTH DAILY. 90 tablet 1  . metFORMIN (GLUCOPHAGE) 1000 MG tablet TAKE 1 TABLET BY MOUTH TWICE DAILY WITH A MEAL FOR DIABETES MANAGMEMENT 180 tablet 0  . TRUEplus Lancets 28G MISC Use to measure blood sugar twice a day 100 each 1  . atorvastatin (LIPITOR) 40 MG tablet TAKE 1 TABLET BY MOUTH DAILY FOR HIGH CHOLESTEROL (Patient not taking: Reported on 07/29/2020) 90 tablet 1  . buPROPion (WELLBUTRIN XL) 150 MG 24 hr tablet TAKE 1 TABLET (150 MG TOTAL) BY MOUTH DAILY. (Patient not taking: Reported on 07/29/2020) 90 tablet 1  . empagliflozin (JARDIANCE) 10 MG TABS tablet TAKE 1 TABLET (10 MG TOTAL) BY MOUTH DAILY BEFORE BREAKFAST. (Patient not taking: Reported on 07/29/2020) 30 tablet 1  . furosemide  (LASIX) 20 MG tablet TAKE 1 TABLET BY MOUTH ONCE A DAY FOR 7 DAYS (Patient not taking: Reported on 07/29/2020) 7 tablet 0   No facility-administered medications prior to visit.       Review of Systems  Constitutional: Negative for chills, fatigue and fever.  HENT: Negative for congestion, drooling and sinus pain.   Cardiovascular: Negative for chest pain and leg swelling.  Gastrointestinal: Positive for diarrhea. Negative for blood in stool and constipation.  Endocrine: Negative for polydipsia, polyphagia and polyuria.  Genitourinary: Negative for decreased urine volume, dysuria and testicular pain.  Musculoskeletal: Negative for back pain, neck pain and neck stiffness.  Skin: Negative for color change, pallor and rash.  Neurological: Negative for dizziness, tremors, weakness and headaches.  Psychiatric/Behavioral: Negative for sleep disturbance and suicidal ideas.     Objective:   Physical Exam Vitals:   07/29/20 1600  BP: 139/79  Pulse: (!) 105  Resp: 20  SpO2: 97%  Weight: 170 lb 8 oz (77.3 kg)  Height: 6' (1.829 m)    Gen: Pleasant, well-nourished, in no distress,  normal affect  ENT: No lesions,  mouth clear,  oropharynx clear, no postnasal drip, poor dentition  Neck: No JVD, no TMG, no carotid bruits  Lungs: No use of accessory muscles, no dullness to percussion, clear without rales or rhonchi  Cardiovascular: RRR, heart sounds normal, no murmur or gallops, no peripheral edema  Abdomen: soft and NT, no HSM,  BS normal  Musculoskeletal: No deformities, no cyanosis or clubbing  Neuro: alert, non focal  Skin: Warm, no lesions or rashes      Assessment & Plan:  I personally reviewed all images and lab data in the [facility] system as well as any outside material available during this office visit and agree with the  radiology impressions.   Essential hypertension Blood pressure at goal no changes  Chronic combined systolic and diastolic heart failure  (HCC) Continue furosemide  Uncontrolled type 2 diabetes mellitus with hyperglycemia ([place]) Diabetes not at goal  Discontinue Jardiance and begin Farxiga 5 mg daily  Adjust insulin to 12 units daily Lantus  Continue metformin 1000 mg twice daily  Return to see clinical pharmacist 2 weeks Return to Dr. Joya Gaskins 6 weeks   History of cocaine abuse [facility]) Congratulations a patient on his sobriety  MDD (major depressive disorder) Stable off antidepressants  Tobacco use    . Current smoking consumption amount: One half pack a week  . Dicsussion on advise to quit smoking and smoking impacts: Cardiovascular impacts  . Patient's willingness to quit: Not ready to quit  . Methods to quit smoking discussed: Not discussed  . Medication management of smoking session drugs discussed: Not discussed  . Resources provided:  AVS   . Setting quit date not yet obtained  . Follow-up arranged short-term follow-up   Time spent counseling the patient: 5 minutes    Diagnoses and all orders for this visit:  Uncontrolled type 2 diabetes mellitus with hyperglycemia (HCC) -     POCT glucose (manual entry) -     POCT glycosylated hemoglobin (Hb A1C) -     metFORMIN (GLUCOPHAGE) 1000 MG tablet; TAKE 1 TABLET BY MOUTH TWICE DAILY WITH A MEAL FOR DIABETES MANAGMEMENT -     insulin glargine (LANTUS SOLOSTAR) 100 UNIT/ML Solostar Pen; Inject 12 Units into the skin at bedtime.  HYPERCHOLESTEROLEMIA -     atorvastatin (LIPITOR) 40 MG tablet; TAKE 1 TABLET BY MOUTH DAILY FOR HIGH CHOLESTEROL  Essential hypertension -     lisinopril (ZESTRIL) 10 MG tablet; TAKE 1 TABLET (10 MG TOTAL) BY MOUTH DAILY.  Chronic combined systolic and diastolic heart failure (HCC)  History of cocaine abuse (HCC)  Severe episode of recurrent major depressive disorder, without psychotic features ([place])  Tobacco use  Other orders -     triamcinolone cream (KENALOG) 0.1 %; Apply 1 application topically 2 (two) times  daily. Mix with skin moisturizer and apply to right small tow -     TRUEplus Lancets 28G MISC; Use to measure blood sugar twice a day -     glucose blood test strip; USE TO CHECK BLOOD SUGARS 2 TIMES DAILY -     dapagliflozin propanediol (FARXIGA) 5 MG TABS tablet; Take 1 tablet (5 mg total) by mouth daily before breakfast.

## 2020-07-29 ENCOUNTER — Ambulatory Visit: Payer: Self-pay | Attending: Critical Care Medicine | Admitting: Critical Care Medicine

## 2020-07-29 ENCOUNTER — Encounter: Payer: Self-pay | Admitting: Critical Care Medicine

## 2020-07-29 ENCOUNTER — Other Ambulatory Visit: Payer: Self-pay

## 2020-07-29 VITALS — BP 139/79 | HR 105 | Resp 20 | Ht 72.0 in | Wt 170.5 lb

## 2020-07-29 DIAGNOSIS — I5042 Chronic combined systolic (congestive) and diastolic (congestive) heart failure: Secondary | ICD-10-CM

## 2020-07-29 DIAGNOSIS — I10 Essential (primary) hypertension: Secondary | ICD-10-CM

## 2020-07-29 DIAGNOSIS — Z720 Tobacco use: Secondary | ICD-10-CM

## 2020-07-29 DIAGNOSIS — F1411 Cocaine abuse, in remission: Secondary | ICD-10-CM

## 2020-07-29 DIAGNOSIS — E1165 Type 2 diabetes mellitus with hyperglycemia: Secondary | ICD-10-CM

## 2020-07-29 DIAGNOSIS — E7800 Pure hypercholesterolemia, unspecified: Secondary | ICD-10-CM

## 2020-07-29 DIAGNOSIS — F332 Major depressive disorder, recurrent severe without psychotic features: Secondary | ICD-10-CM

## 2020-07-29 LAB — POCT GLYCOSYLATED HEMOGLOBIN (HGB A1C): HbA1c, POC (controlled diabetic range): 9.7 % — AB (ref 0.0–7.0)

## 2020-07-29 LAB — GLUCOSE, POCT (MANUAL RESULT ENTRY): POC Glucose: 234 mg/dL — AB (ref 70–99)

## 2020-07-29 MED ORDER — METFORMIN HCL 1000 MG PO TABS
ORAL_TABLET | ORAL | 0 refills | Status: DC
  Filled 2020-07-29: qty 60, 30d supply, fill #0
  Filled 2020-09-25: qty 60, 30d supply, fill #1
  Filled 2020-10-30: qty 60, 30d supply, fill #2

## 2020-07-29 MED ORDER — TRIAMCINOLONE ACETONIDE 0.1 % EX CREA
1.0000 "application " | TOPICAL_CREAM | Freq: Two times a day (BID) | CUTANEOUS | 0 refills | Status: DC
  Filled 2020-07-29: qty 30, 15d supply, fill #0

## 2020-07-29 MED ORDER — LANTUS SOLOSTAR 100 UNIT/ML ~~LOC~~ SOPN
12.0000 [IU] | PEN_INJECTOR | Freq: Every day | SUBCUTANEOUS | 2 refills | Status: DC
  Filled 2020-07-29: qty 3, 25d supply, fill #0

## 2020-07-29 MED ORDER — DAPAGLIFLOZIN PROPANEDIOL 5 MG PO TABS
5.0000 mg | ORAL_TABLET | Freq: Every day | ORAL | 2 refills | Status: DC
  Filled 2020-07-29: qty 30, 30d supply, fill #0
  Filled 2020-09-01: qty 30, 30d supply, fill #1
  Filled 2020-09-25: qty 30, 30d supply, fill #2

## 2020-07-29 MED ORDER — TRUEPLUS LANCETS 28G MISC
1 refills | Status: DC
  Filled 2020-07-29: qty 100, 50d supply, fill #0

## 2020-07-29 MED ORDER — GLUCOSE BLOOD VI STRP
ORAL_STRIP | 12 refills | Status: DC
  Filled 2020-07-29 – 2020-08-13 (×2): qty 100, 50d supply, fill #0
  Filled 2020-09-29: qty 100, 25d supply, fill #0
  Filled 2021-01-09: qty 100, 25d supply, fill #1

## 2020-07-29 MED ORDER — ATORVASTATIN CALCIUM 40 MG PO TABS
ORAL_TABLET | ORAL | 1 refills | Status: DC
  Filled 2020-07-29: qty 30, 30d supply, fill #0
  Filled 2020-09-01: qty 30, 30d supply, fill #1
  Filled 2020-09-25: qty 30, 30d supply, fill #2
  Filled 2020-11-07: qty 30, 30d supply, fill #3

## 2020-07-29 MED ORDER — LISINOPRIL 10 MG PO TABS
ORAL_TABLET | Freq: Every day | ORAL | 1 refills | Status: DC
  Filled 2020-07-29: qty 30, 30d supply, fill #0
  Filled 2020-09-01: qty 30, 30d supply, fill #1
  Filled 2020-09-25: qty 30, 30d supply, fill #2
  Filled 2020-11-10: qty 30, 30d supply, fill #3
  Filled 2020-12-01: qty 30, 30d supply, fill #4

## 2020-07-29 NOTE — Assessment & Plan Note (Signed)
Blood pressure at goal no changes ?

## 2020-07-29 NOTE — Assessment & Plan Note (Signed)
    Current smoking consumption amount: One half pack a week  Dicsussion on advise to quit smoking and smoking impacts: Cardiovascular impacts  Patient's willingness to quit: Not ready to quit  Methods to quit smoking discussed: Not discussed  Medication management of smoking session drugs discussed: Not discussed  Resources provided:  AVS   Setting quit date not yet obtained  Follow-up arranged short-term follow-up   Time spent counseling the patient: 5 minutes  

## 2020-07-29 NOTE — Assessment & Plan Note (Signed)
Congratulations a patient on his sobriety

## 2020-07-29 NOTE — Patient Instructions (Addendum)
Please resume Lasix once daily  Refills on all your medicines sent to our pharmacy  Start Farxiga one daily  Reduce Lantus to 12 units daily  New glucose testing supplies sent  You will have to apply for the orange card in order to go to see a dentist, please ask for an application when you check  See Franky Macho our clinical pharmacist in two weeks  Follow healthy diet below  See Dr Delford Field in 2 months   Diabetes Mellitus and Nutrition, Adult When you have diabetes, or diabetes mellitus, it is very important to have healthy eating habits because your blood sugar (glucose) levels are greatly affected by what you eat and drink. Eating healthy foods in the right amounts, at about the same times every day, can help you:  Control your blood glucose.  Lower your risk of heart disease.  Improve your blood pressure.  Reach or maintain a healthy weight. What can affect my meal plan? Every person with diabetes is different, and each person has different needs for a meal plan. Your health care provider may recommend that you work with a dietitian to make a meal plan that is best for you. Your meal plan may vary depending on factors such as:  The calories you need.  The medicines you take.  Your weight.  Your blood glucose, blood pressure, and cholesterol levels.  Your activity level.  Other health conditions you have, such as heart or kidney disease. How do carbohydrates affect me? Carbohydrates, also called carbs, affect your blood glucose level more than any other type of food. Eating carbs naturally raises the amount of glucose in your blood. Carb counting is a method for keeping track of how many carbs you eat. Counting carbs is important to keep your blood glucose at a healthy level, especially if you use insulin or take certain oral diabetes medicines. It is important to know how many carbs you can safely have in each meal. This is different for every person. Your dietitian can help  you calculate how many carbs you should have at each meal and for each snack. How does alcohol affect me? Alcohol can cause a sudden decrease in blood glucose (hypoglycemia), especially if you use insulin or take certain oral diabetes medicines. Hypoglycemia can be a life-threatening condition. Symptoms of hypoglycemia, such as sleepiness, dizziness, and confusion, are similar to symptoms of having too much alcohol.  Do not drink alcohol if: ? Your health care provider tells you not to drink. ? You are pregnant, may be pregnant, or are planning to become pregnant.  If you drink alcohol: ? Do not drink on an empty stomach. ? Limit how much you use to:  0-1 drink a day for women.  0-2 drinks a day for men. ? Be aware of how much alcohol is in your drink. In the [place] one drink equals one 12 oz bottle of beer (355 mL), one 5 oz glass of wine (148 mL), or one 1 oz glass of hard liquor (44 mL). ? Keep yourself hydrated with water, diet soda, or unsweetened iced tea.  Keep in mind that regular soda, juice, and other mixers may contain a lot of sugar and must be counted as carbs. What are tips for following this plan? Reading food labels  Start by checking the serving size on the "Nutrition Facts" label of packaged foods and drinks. The amount of calories, carbs, fats, and other nutrients listed on the label is based on one serving of the  item. Many items contain more than one serving per package.  Check the total grams (g) of carbs in one serving. You can calculate the number of servings of carbs in one serving by dividing the total carbs by 15. For example, if a food has 30 g of total carbs per serving, it would be equal to 2 servings of carbs.  Check the number of grams (g) of saturated fats and trans fats in one serving. Choose foods that have a low amount or none of these fats.  Check the number of milligrams (mg) of salt (sodium) in one serving. Most people should limit total sodium  intake to less than 2,300 mg per day.  Always check the nutrition information of foods labeled as "low-fat" or "nonfat." These foods may be higher in added sugar or refined carbs and should be avoided.  Talk to your dietitian to identify your daily goals for nutrients listed on the label. Shopping  Avoid buying canned, pre-made, or processed foods. These foods tend to be high in fat, sodium, and added sugar.  Shop around the outside edge of the grocery store. This is where you will most often find fresh fruits and vegetables, bulk grains, fresh meats, and fresh dairy. Cooking  Use low-heat cooking methods, such as baking, instead of high-heat cooking methods like deep frying.  Cook using healthy oils, such as olive, canola, or sunflower oil.  Avoid cooking with butter, cream, or high-fat meats. Meal planning  Eat meals and snacks regularly, preferably at the same times every day. Avoid going long periods of time without eating.  Eat foods that are high in fiber, such as fresh fruits, vegetables, beans, and whole grains. Talk with your dietitian about how many servings of carbs you can eat at each meal.  Eat 4-6 oz (112-168 g) of lean protein each day, such as lean meat, chicken, fish, eggs, or tofu. One ounce (oz) of lean protein is equal to: ? 1 oz (28 g) of meat, chicken, or fish. ? 1 egg. ?  cup (62 g) of tofu.  Eat some foods each day that contain healthy fats, such as avocado, nuts, seeds, and fish.   What foods should I eat? Fruits Berries. Apples. Oranges. Peaches. Apricots. Plums. Grapes. Mango. Papaya. Pomegranate. Kiwi. Cherries. Vegetables Lettuce. Spinach. Leafy greens, including kale, chard, collard greens, and mustard greens. Beets. Cauliflower. Cabbage. Broccoli. Carrots. Green beans. Tomatoes. Peppers. Onions. Cucumbers. Brussels sprouts. Grains Whole grains, such as whole-wheat or whole-grain bread, crackers, tortillas, cereal, and pasta. Unsweetened oatmeal.  Quinoa. Brown or wild rice. Meats and other proteins Seafood. Poultry without skin. Lean cuts of poultry and beef. Tofu. Nuts. Seeds. Dairy Low-fat or fat-free dairy products such as milk, yogurt, and cheese. The items listed above may not be a complete list of foods and beverages you can eat. Contact a dietitian for more information. What foods should I avoid? Fruits Fruits canned with syrup. Vegetables Canned vegetables. Frozen vegetables with butter or cream sauce. Grains Refined white flour and flour products such as bread, pasta, snack foods, and cereals. Avoid all processed foods. Meats and other proteins Fatty cuts of meat. Poultry with skin. Breaded or fried meats. Processed meat. Avoid saturated fats. Dairy Full-fat yogurt, cheese, or milk. Beverages Sweetened drinks, such as soda or iced tea. The items listed above may not be a complete list of foods and beverages you should avoid. Contact a dietitian for more information. Questions to ask a health care provider  Do I need  to meet with a diabetes educator?  Do I need to meet with a dietitian?  What number can I call if I have questions?  When are the best times to check my blood glucose? Where to find more information:  American Diabetes Association: diabetes.org  Academy of Nutrition and Dietetics: www.eatright.[employer] of Diabetes and Digestive and Kidney Diseases: CarFlippers.tn  Association of Diabetes Care and Education Specialists: www.diabeteseducator.org Summary  It is important to have healthy eating habits because your blood sugar (glucose) levels are greatly affected by what you eat and drink.  A healthy meal plan will help you control your blood glucose and maintain a healthy lifestyle.  Your health care provider may recommend that you work with a dietitian to make a meal plan that is best for you.  Keep in mind that carbohydrates (carbs) and alcohol have immediate effects on your  blood glucose levels. It is important to count carbs and to use alcohol carefully. This information is not intended to replace advice given to you by your health care provider. Make sure you discuss any questions you have with your health care provider. Document Revised: 03/06/2019 Document Reviewed: 03/06/2019 Elsevier Patient Education  2021 [employer].

## 2020-07-29 NOTE — Assessment & Plan Note (Signed)
Continue furosemide

## 2020-07-29 NOTE — Assessment & Plan Note (Signed)
Stable off antidepressants. 

## 2020-07-29 NOTE — Assessment & Plan Note (Signed)
Diabetes not at goal  Discontinue Jardiance and begin Farxiga 5 mg daily  Adjust insulin to 12 units daily Lantus  Continue metformin 1000 mg twice daily  Return to see clinical pharmacist 2 weeks Return to Dr. Delford Field 6 weeks

## 2020-07-30 ENCOUNTER — Other Ambulatory Visit: Payer: Self-pay

## 2020-07-31 ENCOUNTER — Other Ambulatory Visit: Payer: Self-pay

## 2020-07-31 ENCOUNTER — Telehealth (HOSPITAL_COMMUNITY): Payer: Self-pay | Admitting: Cardiology

## 2020-07-31 NOTE — Telephone Encounter (Signed)
Just an FYI. We have made several attempts to contact this patient including sending a letter to schedule or reschedule their echocardiogram. We will be removing the patient from the echo WQ.    07/16/20 NO SHOWED-MAILED LETTER [facility]       Thank you

## 2020-08-12 NOTE — Progress Notes (Signed)
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 Presents for diabetes evaluation, education, and management. Patient was referred and last seen by Primary Care Provider on 07/29/20. At that visit, pt reported he could not tolerate Jardiance because it was dropping his sugars. Reported only taking Lantus and metformin. BG was 234 and A1C 9.7%. Jardiance was discontinued and started on Farxiga 5 mg daily. Lantus was decreased from 15 units to 12 units daily. Metformin 1000 mg BID continued.  Today, patient reports medication adherence and no missed doses with Farxiga 5 mg daily, Lantus 12 units daily, metformin 1000 mg BID. Reports sister recently passed away on 08/03/2022 from breast cancer. Reports FBG 141-173. Denies BG<70 and BG>200.   Family/Social History: -Fhx: DMin mother and brother; HTN in other; mental illness and drug abuse in brother -Tobacco use: current every day smoker - 0.5 ppd x23 years -Illicit drug use: alcohol and cocaine abuse  Insurance coverage/medication affordability: dnbi  Medication adherence reported good.   Current diabetes medications include: Farxiga 5 mg daily, Lantus 12 units daily, metformin 1000 mg BID Current hypertension medications include: lisinopril 10 mg daily, furosemide 20 mg daily Current hyperlipidemia medications include: atorvastatin 40 mg daily  Patient denies hypoglycemic events.  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  Lipid Panel     Component Value  Date/Time   CHOL 147 01/16/2017 0622   TRIG 47 01/16/2017 0622   HDL 72 01/16/2017 0622   CHOLHDL 2.0 01/16/2017 0622   VLDL 9 01/16/2017 0622   LDLCALC 66 01/16/2017 0622   Clinical Atherosclerotic Cardiovascular Disease (ASCVD): No  The ASCVD Risk score Denman George DC Jr., et al., 2013) failed to calculate for the following reasons:   Cannot find a previous HDL lab   Cannot find a previous total cholesterol lab    A/P: Diabetes longstanding currently uncontrolled. Patient is able to verbalize appropriate hypoglycemia management plan. Medication adherence appears appears optimal. Encouraged patient to check blood glucose at least twice daily (fasting and 2-hours after largest meal). Patient verbalized understanding. Encouraged patient to aim for a diet full of vegetables, fruit and lean meats (chicken, [place], fish) and to limit carbs (bread, pasta, sugar, rice) and red meat consumption. Encouraged patient to exercise 20-30 minutes daily with the goal of 150 minutes per week. Patient verbalized understanding. -Increased Lantus from 12 units to 14 units daily -Continued Farxiga 5 mg daily -Continued metformin 1000 mg BID -Extensively discussed pathophysiology of diabetes, recommended lifestyle interventions, dietary effects on blood sugar control -Counseled on s/sx of and management of hypoglycemia -Next A1C anticipated 10/2020.   ASCVD risk - primary prevention in patient with diabetes. Last LDL is controlled but outdated. High intensity statin indicated given hx of HFrEF, DM, HTN.  -Continued atorvastatin 40 mg daily -Recommend checking lipid panel at next visit  Written patient instructions provided. Total time in face to face counseling 25 minutes.   Follow up Pharmacist Clinic Visit in 4 weeks.  Fabio Neighbors, PharmD, BCPS PGY2 Ambulatory  Care Resident [facility]  Pharmacy

## 2020-08-13 ENCOUNTER — Other Ambulatory Visit: Payer: Self-pay

## 2020-08-13 ENCOUNTER — Ambulatory Visit: Payer: Self-pay | Attending: Critical Care Medicine | Admitting: Pharmacist

## 2020-08-13 DIAGNOSIS — E1165 Type 2 diabetes mellitus with hyperglycemia: Secondary | ICD-10-CM

## 2020-08-13 LAB — GLUCOSE, POCT (MANUAL RESULT ENTRY): POC Glucose: 246 mg/dl — AB (ref 70–99)

## 2020-08-13 MED ORDER — LANTUS SOLOSTAR 100 UNIT/ML ~~LOC~~ SOPN
14.0000 [IU] | PEN_INJECTOR | Freq: Every day | SUBCUTANEOUS | 2 refills | Status: DC
  Filled 2020-08-13: qty 15, 107d supply, fill #0
  Filled 2020-08-25: qty 3, 21d supply, fill #0
  Filled 2020-09-22: qty 3, 21d supply, fill #1
  Filled 2020-11-07: qty 3, 21d supply, fill #2
  Filled 2020-12-01: qty 3, 21d supply, fill #3

## 2020-08-20 ENCOUNTER — Other Ambulatory Visit: Payer: Self-pay

## 2020-08-25 ENCOUNTER — Other Ambulatory Visit: Payer: Self-pay

## 2020-09-01 ENCOUNTER — Other Ambulatory Visit: Payer: Self-pay

## 2020-09-02 NOTE — Progress Notes (Signed)
HPI: FU cardiomyopathy and congestive heart failure. Patient admitted in January 2018 with dyspnea felt to be a combination of pneumonia and congestive heart failure. Echocardiogram showed severely reduced LV function with ejection fraction 20-25%, restrictive filling, mild left ventricular enlargement, mild to moderate mitral regurgitation and moderately elevated pulmonary pressures. Cardiac catheterization January 2018 showed 50% LAD but otherwise no obstructive coronary disease. Patient was treated with antibiotics and diuretics with improvement.ABIs June 2018 normal.Follow-up echocardiogram July 2018 showed normal LV systolic function and grade 1 diastolic dysfunction. Patient seen in the emergency room March 2022 with dyspnea.  Chest x-ray showed small pleural effusion and vascular engorgement.  Troponin and D-dimer normal.  BNP 175.  Patient given Lasix with improvement in symptoms.  Echocardiogram ordered at last office visit but not performed.  Since last seen,  patient denies dyspnea, chest pain, palpitations or syncope.  Current Outpatient Medications  Medication Sig Dispense Refill  . aspirin 81 MG chewable tablet Chew 81 mg by mouth daily. 1Daily    . atorvastatin (LIPITOR) 40 MG tablet TAKE 1 TABLET BY MOUTH DAILY FOR HIGH CHOLESTEROL 90 tablet 1  . blood glucose meter kit and supplies Dispense based on patient and insurance preference. Use up to four times daily as directed. (FOR ICD-9 250.00, 250.01). 1 each 0  . Blood Glucose Monitoring Suppl (TRUE METRIX METER) w/Device KIT Use to measure blood sugar twice a day 1 kit 0  . cholecalciferol (VITAMIN D3) 25 MCG (1000 UNIT) tablet Take 1,000 Units by mouth daily.    . dapagliflozin propanediol (FARXIGA) 5 MG TABS tablet Take 1 tablet (5 mg total) by mouth daily before breakfast. 30 tablet 2  . furosemide (LASIX) 20 MG tablet TAKE 1 TABLET (20 MG TOTAL) BY MOUTH DAILY. 90 tablet 3  . glucose blood test strip USE TO CHECK BLOOD  SUGARS 2 TIMES DAILY 100 strip 12  . insulin glargine (LANTUS SOLOSTAR) 100 UNIT/ML Solostar Pen Inject 12 Units into the skin daily.    . Insulin Syringe 27G X 1/2" 0.5 ML MISC Use with lantus 100 each 1  . lisinopril (ZESTRIL) 10 MG tablet TAKE 1 TABLET (10 MG TOTAL) BY MOUTH DAILY. 90 tablet 1  . metFORMIN (GLUCOPHAGE) 1000 MG tablet TAKE 1 TABLET BY MOUTH TWICE DAILY WITH A MEAL FOR DIABETES MANAGMEMENT 180 tablet 0  . triamcinolone cream (KENALOG) 0.1 % Apply 1 application topically 2 (two) times daily. Mix with skin moisturizer and apply to right small toe 30 g 0  . TRUEplus Lancets 28G MISC Use to measure blood sugar twice a day 100 each 1  . insulin glargine (LANTUS SOLOSTAR) 100 UNIT/ML Solostar Pen Inject 14 Units into the skin at bedtime. (Patient not taking: Reported on 09/16/2020) 15 mL 2  . sildenafil (VIAGRA) 100 MG tablet TAKE 0.5-1 TABLETS (50-100 MG TOTAL) BY MOUTH DAILY AS NEEDED FOR ERECTILE DYSFUNCTION. 5 tablet 11   No current facility-administered medications for this visit.     Past Medical History:  Diagnosis Date  . Cellulitis and abscess of foot 09/29/2016  . Chronic systolic heart failure ([place])   . Cocaine abuse ([place])   . Cocaine dependence with cocaine-induced mood disorder ([place])   . Depression   . Diabetes mellitus without complication ([place])   . Essential hypertension   . NICM (nonischemic cardiomyopathy) ([place]) EF 20-25%   . Suicidal ideations 07/12/2017    Past Surgical History:  Procedure Laterality Date  . CARDIAC CATHETERIZATION N/A 04/30/2016   Procedure:  Right/Left Heart Cath and Coronary Angiography;  Surgeon: Nelva Bush, MD;  Location: [place] [facility];  Service: Cardiovascular;  Laterality: N/A;  . FINGER SURGERY      Social History   Socioeconomic History  . Marital status: Married    Spouse name: Not on file  . Number of children: Not on file  . Years of education: Not on file  . Highest education level: Not on file  Occupational  History  . Not on file  Tobacco Use  . Smoking status: Current Every Day Smoker    Packs/day: 0.50    Years: 23.00    Pack years: 11.50    Types: Cigarettes  . Smokeless tobacco: Never Used  . Tobacco comment: Every other day   Vaping Use  . Vaping Use: Never used  Substance and Sexual Activity  . Alcohol use: Yes  . Drug use: Not Currently    Types: Cocaine, "Crack" cocaine  . Sexual activity: Not Currently  Other Topics Concern  . Not on file  Social History Narrative  . Not on file   Social Determinants of Health   Financial Resource Strain: Not on file  Food Insecurity: Not on file  Transportation Needs: Not on file  Physical Activity: Not on file  Stress: Not on file  Social Connections: Not on file  Intimate Partner Violence: Not on file    Family History  Problem Relation Age of Onset  . Diabetes Other   . Hypertension Other   . Diabetes Mother   . Brain cancer Father   . Mental illness Brother   . Drug abuse Brother   . Diabetes Brother     ROS: no fevers or chills, productive cough, hemoptysis, dysphasia, odynophagia, melena, hematochezia, dysuria, hematuria, rash, seizure activity, orthopnea, PND, pedal edema, claudication. Remaining systems are negative.  Physical Exam: Well-developed well-nourished in no acute distress.  Skin is warm and dry.  HEENT is normal.  Neck is supple.  Chest is clear to auscultation with normal expansion.  Cardiovascular exam is regular rate and rhythm.  Abdominal exam nontender or distended. No masses palpated. Extremities show no edema. neuro grossly intact  A/P  1 nonischemic cardiomyopathy-plan to continue lisinopril.  I will reorder echocardiogram (ordered at last 2 OV but pt did not show); resume beta blocker if LV function reduced.  2 coronary artery disease-No CP. Continue medical therapy with aspirin and statin.  3 chronic diastolic congestive heart failure-euvolemic; change Lasix to 20 mg daily as  needed.  4 hypertension-blood pressure controlled.  Continue present medical regimen.  5 hyperlipidemia-continue statin.  6 history of cocaine abuse-denies use at present.  7 tobacco abuse-patient counseled on discontinuing.  Kirk Ruths, MD

## 2020-09-10 ENCOUNTER — Ambulatory Visit: Payer: Self-pay | Admitting: Pharmacist

## 2020-09-10 NOTE — Progress Notes (Unsigned)
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  Presents for diabetes evaluation, education, and management. Patient was referred and last seen by Primary Care Provider on 07/29/20. Pt seen by pharmacy on 08/13/20. At that visit, POCT 243 (post-prandial) and home FBG 141-173. Lantus was increased from 12 units to 14 units daily.  Today, patient reports ***  A1C = Check Clinic BG Review medications and adherence (timing of meds, etc.)  Ate or drank anything prior to visit today? At home BGs?  Marland Kitchen Highs . Lows  Hyperglycemia sx (nocturia, neuropathy, visual changes, foot exams) Hypoglycemia symptoms (dizziness, shaky, sweating, hungry, confusion) Diet Exercise  Plan 1. GLP vs insulin titration 2. Lipid panel today?  Family/Social History: -Fhx: DMin mother and brother; HTN in other; mental illness and drug abuse in brother -Tobacco use: current every day smoker - 0.5 ppd x23 years -Illicit drug use: alcohol and cocaine abuse  Insurance coverage/medication affordability: dnbi  Medication adherence reported *** .   Current diabetes medications include: Farxiga 5 mg daily, Lantus 14 units daily, metformin 1000 mg BID Current hypertension medications include: lisinopril 10 mg daily, furosemide 20 mg daily Current hyperlipidemia medications include: atorvastatin 40 mg daily  Patient {Actions; denies-reports:120008} hypoglycemic events.  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  Patient {Actions; denies-reports:120008} neuropathy (nerve pain). Patient {Actions; denies-reports:120008} visual changes. Patient {Actions; denies-reports:120008} self foot exams.     O:   POCT:   Home fasting blood sugars: ***  2 hour post-meal/random blood sugars: ***.  Lab Results  Component Value Date   HGBA1C 9.7 (A) 07/29/2020   There were no vitals filed for this visit.  Lipid Panel     Component Value Date/Time   CHOL 147 01/16/2017 0622   TRIG 47 01/16/2017 0622   HDL 72 01/16/2017 0622   CHOLHDL 2.0 01/16/2017 0622   VLDL 9 01/16/2017 0622   LDLCALC 66 01/16/2017 0622    Clinical Atherosclerotic Cardiovascular Disease (ASCVD): No  The ASCVD Risk score Denman George DC Jr., et al., 2013) failed to calculate for the following reasons:   Cannot find a previous HDL lab   Cannot find a previous total cholesterol lab    A/P: Diabetes longstanding*** currently ***. Patient is *** able to verbalize appropriate hypoglycemia management plan. Medication adherence appears ***. Control is suboptimal due to ***. -{Meds adjust:18428} basal insulin *** (insulin ***). Patient will continue to titrate 1 unit every *** days if fasting blood sugar > 100mg /dl until fasting blood sugars reach goal or next visit.  -{Meds adjust:18428}  rapid insulin *** (insulin ***) to ***.  -{Meds adjust:18428} GLP-1 *** (generic name***) to ***.  -{Meds adjust:18428} SGLT2-I *** (generic name***) to ***. Counseled on sick day rules for ***. -Extensively discussed pathophysiology of diabetes, recommended lifestyle interventions, dietary effects on blood sugar control -Counseled on s/sx of and management of hypoglycemia -Next A1C anticipated 10/2020.   ASCVD risk - primary prevention in patient with diabetes. Last LDL is controlled but outdated. High intensity statin indicated given hx of HFrEF, DM, HTN.  -Continued atorvastatin 40 mg daily -Recommend checking lipid panel today  Written patient instructions provided.  Total time in face to face counseling *** minutes.   Follow  up Pharmacist/PCP*** Clinic Visit in ***.     Fabio Neighbors, PharmD, BCPS PGY2 Ambulatory Care Resident [facility]   Pharmacy

## 2020-09-15 ENCOUNTER — Other Ambulatory Visit: Payer: Self-pay

## 2020-09-16 ENCOUNTER — Ambulatory Visit (INDEPENDENT_AMBULATORY_CARE_PROVIDER_SITE_OTHER): Payer: Self-pay | Admitting: Cardiology

## 2020-09-16 ENCOUNTER — Encounter: Payer: Self-pay | Admitting: Cardiology

## 2020-09-16 ENCOUNTER — Other Ambulatory Visit: Payer: Self-pay

## 2020-09-16 VITALS — BP 118/58 | HR 92 | Ht 72.0 in | Wt 162.8 lb

## 2020-09-16 DIAGNOSIS — I10 Essential (primary) hypertension: Secondary | ICD-10-CM

## 2020-09-16 DIAGNOSIS — I420 Dilated cardiomyopathy: Secondary | ICD-10-CM

## 2020-09-16 DIAGNOSIS — E7800 Pure hypercholesterolemia, unspecified: Secondary | ICD-10-CM

## 2020-09-16 DIAGNOSIS — I5042 Chronic combined systolic (congestive) and diastolic (congestive) heart failure: Secondary | ICD-10-CM

## 2020-09-16 MED ORDER — FUROSEMIDE 20 MG PO TABS
20.0000 mg | ORAL_TABLET | Freq: Every day | ORAL | 3 refills | Status: DC | PRN
  Filled 2020-09-16: qty 30, 30d supply, fill #0

## 2020-09-16 NOTE — Patient Instructions (Addendum)
START FUROSEMIDE 20 MG ONCE DAILY AS NEEDED   Testing/Procedures:  Your physician has requested that you have an echocardiogram. Echocardiography is a painless test that uses sound waves to create images of your heart. It provides your doctor with information about the size and shape of your heart and how well your heart's chambers and valves are working. This procedure takes approximately one hour. There are no restrictions for this procedure.[address]     Follow-Up: At [facility], you and your health needs are our priority.  As part of our continuing mission to provide you with exceptional heart care, we have created designated Provider Care Teams.  These Care Teams include your primary Cardiologist (physician) and Advanced Practice Providers (APPs -  Physician Assistants and Nurse Practitioners) who all work together to provide you with the care you need, when you need it.  We recommend signing up for the patient portal called "MyChart".  Sign up information is provided on this After Visit Summary.  MyChart is used to connect with patients for Virtual Visits (Telemedicine).  Patients are able to view lab/test results, encounter notes, upcoming appointments, etc.  Non-urgent messages can be sent to your provider as well.   To learn more about what you can do with MyChart, go to [email or web address].    Your next appointment:   12 month(s)  The format for your next appointment:   In Person  Provider:   Olga Millers, MD

## 2020-09-22 ENCOUNTER — Other Ambulatory Visit: Payer: Self-pay

## 2020-09-23 ENCOUNTER — Other Ambulatory Visit: Payer: Self-pay

## 2020-09-25 ENCOUNTER — Other Ambulatory Visit: Payer: Self-pay

## 2020-09-25 MED FILL — Sildenafil Citrate Tab 100 MG: ORAL | 30 days supply | Qty: 10 | Fill #0 | Status: AC

## 2020-09-29 ENCOUNTER — Other Ambulatory Visit (HOSPITAL_COMMUNITY): Payer: Self-pay

## 2020-09-29 ENCOUNTER — Ambulatory Visit: Payer: Self-pay | Admitting: Critical Care Medicine

## 2020-09-29 ENCOUNTER — Other Ambulatory Visit: Payer: Self-pay

## 2020-10-08 ENCOUNTER — Other Ambulatory Visit (HOSPITAL_COMMUNITY): Payer: Self-pay

## 2020-10-09 ENCOUNTER — Encounter (HOSPITAL_COMMUNITY): Payer: Self-pay | Admitting: Cardiology

## 2020-10-15 ENCOUNTER — Other Ambulatory Visit: Payer: Self-pay

## 2020-10-20 ENCOUNTER — Telehealth (HOSPITAL_COMMUNITY): Payer: Self-pay | Admitting: Cardiology

## 2020-10-20 NOTE — Telephone Encounter (Signed)
Just an FYI. We have made several attempts to contact this patient including sending a letter to schedule or reschedule their echocardiogram. We will be removing the patient from the echo WQ.   10/08/20 NO SHOWED-MAILED LETTER [facility]      Thank you  

## 2020-10-20 NOTE — Telephone Encounter (Signed)
Routed to Dr Ludwig Clarks Nurse

## 2020-10-30 ENCOUNTER — Other Ambulatory Visit: Payer: Self-pay

## 2020-10-30 MED FILL — Sildenafil Citrate Tab 100 MG: ORAL | 30 days supply | Qty: 10 | Fill #1 | Status: AC

## 2020-11-07 ENCOUNTER — Other Ambulatory Visit: Payer: Self-pay

## 2020-11-10 ENCOUNTER — Other Ambulatory Visit: Payer: Self-pay

## 2020-11-10 ENCOUNTER — Other Ambulatory Visit: Payer: Self-pay | Admitting: Critical Care Medicine

## 2020-11-10 MED ORDER — DAPAGLIFLOZIN PROPANEDIOL 5 MG PO TABS
5.0000 mg | ORAL_TABLET | Freq: Every day | ORAL | 2 refills | Status: DC
  Filled 2020-11-10: qty 90, 90d supply, fill #0

## 2020-11-12 ENCOUNTER — Other Ambulatory Visit: Payer: Self-pay

## 2020-11-17 ENCOUNTER — Encounter: Payer: Self-pay | Admitting: Critical Care Medicine

## 2020-11-17 ENCOUNTER — Other Ambulatory Visit: Payer: Self-pay

## 2020-11-17 ENCOUNTER — Other Ambulatory Visit: Payer: Self-pay | Admitting: *Deleted

## 2020-11-17 ENCOUNTER — Ambulatory Visit (HOSPITAL_BASED_OUTPATIENT_CLINIC_OR_DEPARTMENT_OTHER): Payer: Self-pay | Admitting: Critical Care Medicine

## 2020-11-17 DIAGNOSIS — I420 Dilated cardiomyopathy: Secondary | ICD-10-CM

## 2020-11-17 DIAGNOSIS — E1165 Type 2 diabetes mellitus with hyperglycemia: Secondary | ICD-10-CM

## 2020-11-17 NOTE — Progress Notes (Signed)
Patient ID: James [name]., male   DOB: November 25, 1960, 60 y.o.   MRN: [id number] Pt starting new job and cannot do the appointment today  He prefers waiting till early September and prefers a 4:00 Monday or Tuesday slot we will get him rescheduled I told him to call cardiology to get his ultrasound of the heart schedule

## 2020-12-01 ENCOUNTER — Other Ambulatory Visit: Payer: Self-pay

## 2020-12-01 ENCOUNTER — Other Ambulatory Visit: Payer: Self-pay | Admitting: Critical Care Medicine

## 2020-12-01 DIAGNOSIS — E1165 Type 2 diabetes mellitus with hyperglycemia: Secondary | ICD-10-CM

## 2020-12-01 MED FILL — Sildenafil Citrate Tab 100 MG: ORAL | 30 days supply | Qty: 10 | Fill #2 | Status: AC

## 2020-12-01 NOTE — Telephone Encounter (Signed)
Requested medication (s) are due for refill today: yes   Requested medication (s) are on the active medication list:  yes   Last refill:  10/30/2020  Future visit scheduled:  yes   Notes to clinic:  Failed protocol:  HBA1C is between 0 and 7.9 and within 180 days  Requested Prescriptions  Pending Prescriptions Disp Refills   metFORMIN (GLUCOPHAGE) 1000 MG tablet 180 tablet 0    Sig: TAKE 1 TABLET BY MOUTH TWICE DAILY WITH A MEAL FOR DIABETES [place]     Endocrinology:  Diabetes - Biguanides Failed - 12/01/2020 10:15 AM      Failed - HBA1C is between 0 and 7.9 and within 180 days    HbA1c, POC (controlled diabetic range)  Date Value Ref Range Status  07/29/2020 9.7 (A) 0.0 - 7.0 % Final          Passed - Cr in normal range and within 360 days    Creat  Date Value Ref Range Status  06/15/2016 0.97 0.70 - 1.33 mg/dL Final    Comment:      For patients > or = 62 years of age: The upper reference limit for Creatinine is approximately 13% higher for people identified as African-American.      Creatinine, Ser  Date Value Ref Range Status  06/23/2020 1.19 0.76 - 1.27 mg/dL Final          Passed - AA eGFR in normal range and within 360 days    GFR calc Af Amer  Date Value Ref Range Status  05/01/2020 63 >59 mL/min/1.73 Final    Comment:    **In accordance with recommendations from the NKF-ASN Task force,**   [facility] is in the process of updating its eGFR calculation to the   2021 CKD-EPI creatinine equation that estimates kidney function   without a race variable.    GFR, Estimated  Date Value Ref Range Status  06/19/2020 >60 >60 mL/min Final    Comment:    (NOTE) Calculated using the CKD-EPI Creatinine Equation (2021)    eGFR  Date Value Ref Range Status  06/23/2020 69 >59 mL/min/1.73 Final          Passed - Valid encounter within last 6 months    Recent Outpatient Visits           2 weeks ago Uncontrolled type 2 diabetes mellitus with hyperglycemia  [facility])   [place] Elsie Stain, MD   3 months ago Uncontrolled type 2 diabetes mellitus with hyperglycemia [facility])   [place], Annie Main L, RPH-CPP   4 months ago Uncontrolled type 2 diabetes mellitus with hyperglycemia [facility])   [place] Elsie Stain, MD   7 months ago Uncontrolled type 2 diabetes mellitus with hyperglycemia [facility])   [place], [place], [place]   10 months ago Uncontrolled type 2 diabetes mellitus with hyperglycemia [facility])   [place], MD       Future Appointments             In 3 weeks Elsie Stain, MD [place]

## 2020-12-03 MED ORDER — METFORMIN HCL 1000 MG PO TABS
ORAL_TABLET | ORAL | 0 refills | Status: DC
  Filled 2020-12-03: qty 60, 30d supply, fill #0

## 2020-12-04 ENCOUNTER — Other Ambulatory Visit: Payer: Self-pay

## 2020-12-05 ENCOUNTER — Other Ambulatory Visit: Payer: Self-pay

## 2020-12-10 ENCOUNTER — Emergency Department (HOSPITAL_COMMUNITY): Payer: Self-pay

## 2020-12-10 ENCOUNTER — Inpatient Hospital Stay (HOSPITAL_COMMUNITY)
Admission: EM | Admit: 2020-12-10 | Discharge: 2020-12-17 | DRG: 224 | Disposition: A | Discharge status: Home / Self Care | Payer: Self-pay | Attending: Internal Medicine | Admitting: Internal Medicine

## 2020-12-10 DIAGNOSIS — E1165 Type 2 diabetes mellitus with hyperglycemia: Principal | ICD-10-CM | POA: Diagnosis present

## 2020-12-10 DIAGNOSIS — G9349 Other encephalopathy: Secondary | ICD-10-CM | POA: Diagnosis present

## 2020-12-10 DIAGNOSIS — Z818 Family history of other mental and behavioral disorders: Secondary | ICD-10-CM

## 2020-12-10 DIAGNOSIS — R319 Hematuria, unspecified: Secondary | ICD-10-CM | POA: Diagnosis not present

## 2020-12-10 DIAGNOSIS — Z95810 Presence of automatic (implantable) cardiac defibrillator: Secondary | ICD-10-CM

## 2020-12-10 DIAGNOSIS — Z813 Family history of other psychoactive substance abuse and dependence: Secondary | ICD-10-CM

## 2020-12-10 DIAGNOSIS — Z808 Family history of malignant neoplasm of other organs or systems: Secondary | ICD-10-CM

## 2020-12-10 DIAGNOSIS — Y848 Other medical procedures as the cause of abnormal reaction of the patient, or of later complication, without mention of misadventure at the time of the procedure: Secondary | ICD-10-CM | POA: Diagnosis present

## 2020-12-10 DIAGNOSIS — J9601 Acute respiratory failure with hypoxia: Secondary | ICD-10-CM

## 2020-12-10 DIAGNOSIS — I472 Ventricular tachycardia: Secondary | ICD-10-CM | POA: Diagnosis present

## 2020-12-10 DIAGNOSIS — I420 Dilated cardiomyopathy: Secondary | ICD-10-CM | POA: Diagnosis present

## 2020-12-10 DIAGNOSIS — F17210 Nicotine dependence, cigarettes, uncomplicated: Secondary | ICD-10-CM | POA: Diagnosis present

## 2020-12-10 DIAGNOSIS — I4901 Ventricular fibrillation: Principal | ICD-10-CM | POA: Diagnosis present

## 2020-12-10 DIAGNOSIS — Z833 Family history of diabetes mellitus: Secondary | ICD-10-CM

## 2020-12-10 DIAGNOSIS — K819 Cholecystitis, unspecified: Secondary | ICD-10-CM

## 2020-12-10 DIAGNOSIS — F32A Depression, unspecified: Secondary | ICD-10-CM | POA: Diagnosis present

## 2020-12-10 DIAGNOSIS — I5043 Acute on chronic combined systolic (congestive) and diastolic (congestive) heart failure: Secondary | ICD-10-CM | POA: Diagnosis not present

## 2020-12-10 DIAGNOSIS — E11649 Type 2 diabetes mellitus with hypoglycemia without coma: Secondary | ICD-10-CM | POA: Diagnosis not present

## 2020-12-10 DIAGNOSIS — I272 Pulmonary hypertension, unspecified: Secondary | ICD-10-CM | POA: Diagnosis present

## 2020-12-10 DIAGNOSIS — N179 Acute kidney failure, unspecified: Secondary | ICD-10-CM

## 2020-12-10 DIAGNOSIS — I110 Hypertensive heart disease with heart failure: Secondary | ICD-10-CM | POA: Diagnosis present

## 2020-12-10 DIAGNOSIS — I2510 Atherosclerotic heart disease of native coronary artery without angina pectoris: Secondary | ICD-10-CM | POA: Diagnosis present

## 2020-12-10 DIAGNOSIS — K8010 Calculus of gallbladder with chronic cholecystitis without obstruction: Secondary | ICD-10-CM | POA: Diagnosis present

## 2020-12-10 DIAGNOSIS — E872 Acidosis: Secondary | ICD-10-CM | POA: Diagnosis present

## 2020-12-10 DIAGNOSIS — Z79899 Other long term (current) drug therapy: Secondary | ICD-10-CM

## 2020-12-10 DIAGNOSIS — Z7982 Long term (current) use of aspirin: Secondary | ICD-10-CM

## 2020-12-10 DIAGNOSIS — H9190 Unspecified hearing loss, unspecified ear: Secondary | ICD-10-CM | POA: Diagnosis present

## 2020-12-10 DIAGNOSIS — Z794 Long term (current) use of insulin: Secondary | ICD-10-CM

## 2020-12-10 DIAGNOSIS — Z8249 Family history of ischemic heart disease and other diseases of the circulatory system: Secondary | ICD-10-CM

## 2020-12-10 DIAGNOSIS — F1424 Cocaine dependence with cocaine-induced mood disorder: Secondary | ICD-10-CM | POA: Diagnosis present

## 2020-12-10 DIAGNOSIS — I462 Cardiac arrest due to underlying cardiac condition: Secondary | ICD-10-CM | POA: Diagnosis present

## 2020-12-10 DIAGNOSIS — Z8674 Personal history of sudden cardiac arrest: Secondary | ICD-10-CM

## 2020-12-10 DIAGNOSIS — N529 Male erectile dysfunction, unspecified: Secondary | ICD-10-CM | POA: Diagnosis present

## 2020-12-10 DIAGNOSIS — S2243XA Multiple fractures of ribs, bilateral, initial encounter for closed fracture: Secondary | ICD-10-CM

## 2020-12-10 DIAGNOSIS — Z20822 Contact with and (suspected) exposure to covid-19: Secondary | ICD-10-CM | POA: Diagnosis present

## 2020-12-10 DIAGNOSIS — K7200 Acute and subacute hepatic failure without coma: Secondary | ICD-10-CM | POA: Diagnosis not present

## 2020-12-10 DIAGNOSIS — R570 Cardiogenic shock: Secondary | ICD-10-CM | POA: Diagnosis present

## 2020-12-10 DIAGNOSIS — J690 Pneumonitis due to inhalation of food and vomit: Secondary | ICD-10-CM | POA: Diagnosis present

## 2020-12-10 DIAGNOSIS — M9689 Other intraoperative and postprocedural complications and disorders of the musculoskeletal system: Secondary | ICD-10-CM | POA: Diagnosis present

## 2020-12-10 DIAGNOSIS — I469 Cardiac arrest, cause unspecified: Secondary | ICD-10-CM

## 2020-12-10 LAB — CBC WITH DIFFERENTIAL/PLATELET
Abs Immature Granulocytes: 0.12 10*3/uL — ABNORMAL HIGH (ref 0.00–0.07)
Basophils Absolute: 0 10*3/uL (ref 0.0–0.1)
Basophils Relative: 0 %
Eosinophils Absolute: 0.1 10*3/uL (ref 0.0–0.5)
Eosinophils Relative: 2 %
HCT: 44.3 % (ref 39.0–52.0)
Hemoglobin: 13.4 g/dL (ref 13.0–17.0)
Immature Granulocytes: 2 %
Lymphocytes Relative: 62 %
Lymphs Abs: 4.1 10*3/uL — ABNORMAL HIGH (ref 0.7–4.0)
MCH: 28 pg (ref 26.0–34.0)
MCHC: 30.2 g/dL (ref 30.0–36.0)
MCV: 92.7 fL (ref 80.0–100.0)
Monocytes Absolute: 0.3 10*3/uL (ref 0.1–1.0)
Monocytes Relative: 5 %
Neutro Abs: 1.9 10*3/uL (ref 1.7–7.7)
Neutrophils Relative %: 29 %
Platelets: 212 10*3/uL (ref 150–400)
RBC: 4.78 MIL/uL (ref 4.22–5.81)
RDW: 15.2 % (ref 11.5–15.5)
WBC: 6.6 10*3/uL (ref 4.0–10.5)
nRBC: 0 % (ref 0.0–0.2)

## 2020-12-10 LAB — I-STAT CHEM 8, ED
BUN: 23 mg/dL (ref 8–23)
Calcium, Ion: 1.04 mmol/L — ABNORMAL LOW (ref 1.15–1.40)
Chloride: 105 mmol/L (ref 98–111)
Creatinine, Ser: 1.3 mg/dL — ABNORMAL HIGH (ref 0.61–1.24)
Glucose, Bld: 298 mg/dL — ABNORMAL HIGH (ref 70–99)
HCT: 43 % (ref 39.0–52.0)
Hemoglobin: 14.6 g/dL (ref 13.0–17.0)
Potassium: 4.2 mmol/L (ref 3.5–5.1)
Sodium: 139 mmol/L (ref 135–145)
TCO2: 19 mmol/L — ABNORMAL LOW (ref 22–32)

## 2020-12-10 LAB — COMPREHENSIVE METABOLIC PANEL
ALT: 684 U/L — ABNORMAL HIGH (ref 0–44)
AST: 579 U/L — ABNORMAL HIGH (ref 15–41)
Albumin: 3.2 g/dL — ABNORMAL LOW (ref 3.5–5.0)
Alkaline Phosphatase: 116 U/L (ref 38–126)
Anion gap: 16 — ABNORMAL HIGH (ref 5–15)
BUN: 20 mg/dL (ref 8–23)
CO2: 17 mmol/L — ABNORMAL LOW (ref 22–32)
Calcium: 8.3 mg/dL — ABNORMAL LOW (ref 8.9–10.3)
Chloride: 106 mmol/L (ref 98–111)
Creatinine, Ser: 1.44 mg/dL — ABNORMAL HIGH (ref 0.61–1.24)
GFR, Estimated: 55 mL/min — ABNORMAL LOW (ref >60)
Glucose, Bld: 317 mg/dL — ABNORMAL HIGH (ref 70–99)
Potassium: 4.4 mmol/L (ref 3.5–5.1)
Sodium: 139 mmol/L (ref 135–145)
Total Bilirubin: 0.9 mg/dL (ref 0.3–1.2)
Total Protein: 5.6 g/dL — ABNORMAL LOW (ref 6.5–8.1)

## 2020-12-10 LAB — MAGNESIUM: Magnesium: 2.1 mg/dL (ref 1.7–2.4)

## 2020-12-10 LAB — I-STAT ARTERIAL BLOOD GAS, ED
Acid-base deficit: 7 mmol/L — ABNORMAL HIGH (ref 0.0–2.0)
Bicarbonate: 19.5 mmol/L — ABNORMAL LOW (ref 20.0–28.0)
Calcium, Ion: 1.09 mmol/L — ABNORMAL LOW (ref 1.15–1.40)
HCT: 41 % (ref 39.0–52.0)
Hemoglobin: 13.9 g/dL (ref 13.0–17.0)
O2 Saturation: 99 %
Patient temperature: 96.8
Potassium: 4.5 mmol/L (ref 3.5–5.1)
Sodium: 136 mmol/L (ref 135–145)
TCO2: 21 mmol/L — ABNORMAL LOW (ref 22–32)
pCO2 arterial: 40.1 mmHg (ref 32.0–48.0)
pH, Arterial: 7.291 — ABNORMAL LOW (ref 7.350–7.450)
pO2, Arterial: 171 mmHg — ABNORMAL HIGH (ref 83.0–108.0)

## 2020-12-10 LAB — LACTIC ACID, PLASMA: Lactic Acid, Venous: 7.9 mmol/L (ref 0.5–1.9)

## 2020-12-10 LAB — TROPONIN I (HIGH SENSITIVITY): Troponin I (High Sensitivity): 74 ng/L — ABNORMAL HIGH (ref <18)

## 2020-12-10 LAB — RESP PANEL BY RT-PCR (FLU A&B, COVID) ARPGX2
Influenza A by PCR: NEGATIVE
Influenza B by PCR: NEGATIVE
SARS Coronavirus 2 by RT PCR: NEGATIVE

## 2020-12-10 MED ORDER — IOHEXOL 350 MG/ML SOLN
135.0000 mL | Freq: Once | INTRAVENOUS | Status: AC | PRN
  Administered 2020-12-10: 135 mL via INTRAVENOUS

## 2020-12-10 MED ORDER — MIDAZOLAM HCL 2 MG/2ML IJ SOLN: 2.0000 mg | INTRAMUSCULAR | Status: DC | PRN

## 2020-12-10 MED ORDER — NOREPINEPHRINE 4 MG/250ML-% IV SOLN
0.0000 ug/min | INTRAVENOUS | Status: DC
  Administered 2020-12-10 (×2): 10 ug/min via INTRAVENOUS

## 2020-12-10 MED ORDER — MIDAZOLAM HCL 2 MG/2ML IJ SOLN
2.0000 mg | INTRAMUSCULAR | Status: DC | PRN
  Administered 2020-12-11: 2 mg via INTRAVENOUS
  Filled 2020-12-10 (×2): qty 2

## 2020-12-10 MED ORDER — PANTOPRAZOLE SODIUM 40 MG IV SOLR
40.0000 mg | Freq: Every day | INTRAVENOUS | Status: DC
  Administered 2020-12-11: 40 mg via INTRAVENOUS
  Filled 2020-12-10: qty 40

## 2020-12-10 MED ORDER — FENTANYL CITRATE PF 50 MCG/ML IJ SOSY
50.0000 ug | PREFILLED_SYRINGE | Freq: Once | INTRAMUSCULAR | Status: DC
Start: 2020-12-10 — End: 2020-12-11

## 2020-12-10 MED ORDER — FENTANYL CITRATE PF 50 MCG/ML IJ SOSY
PREFILLED_SYRINGE | INTRAMUSCULAR | Status: AC
  Filled 2020-12-10: qty 2

## 2020-12-10 MED ORDER — METRONIDAZOLE 500 MG/100ML IV SOLN: 500.0000 mg | Freq: Once | INTRAVENOUS | Status: DC

## 2020-12-10 MED ORDER — FENTANYL 2500MCG IN NS 250ML (10MCG/ML) PREMIX INFUSION
25.0000 ug/h | INTRAVENOUS | Status: DC
  Administered 2020-12-10: 100 ug/h via INTRAVENOUS
  Filled 2020-12-10: qty 250

## 2020-12-10 MED ORDER — POLYETHYLENE GLYCOL 3350 17 G PO PACK
17.0000 g | PACK | Freq: Every day | ORAL | Status: DC
  Administered 2020-12-11: 17 g
  Filled 2020-12-10: qty 1

## 2020-12-10 MED ORDER — FENTANYL CITRATE (PF) 100 MCG/2ML IJ SOLN
INTRAMUSCULAR | Status: AC
  Administered 2020-12-10: 100 ug
  Filled 2020-12-10: qty 2

## 2020-12-10 MED ORDER — PROPOFOL 1000 MG/100ML IV EMUL
5.0000 ug/kg/min | INTRAVENOUS | Status: DC
  Administered 2020-12-10: 15 ug/kg/min via INTRAVENOUS

## 2020-12-10 MED ORDER — FENTANYL BOLUS VIA INFUSION
50.0000 ug | INTRAVENOUS | Status: DC | PRN
Start: 2020-12-10 — End: 2020-12-11
  Administered 2020-12-11 (×4): 50 ug via INTRAVENOUS
  Filled 2020-12-10: qty 50

## 2020-12-10 MED ORDER — PROPOFOL 1000 MG/100ML IV EMUL
5.0000 ug/kg/min | INTRAVENOUS | Status: DC
  Administered 2020-12-10: 20 ug/kg/min via INTRAVENOUS
  Administered 2020-12-11: 40 ug/kg/min via INTRAVENOUS
  Filled 2020-12-10 (×2): qty 100

## 2020-12-10 MED ORDER — DOCUSATE SODIUM 50 MG/5ML PO LIQD
100.0000 mg | Freq: Two times a day (BID) | ORAL | Status: DC
  Administered 2020-12-11: 100 mg
  Filled 2020-12-10: qty 10
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
## 2020-12-10 NOTE — ED Notes (Signed)
 fentanyl IV given, verbal order EDP.

## 2020-12-11 ENCOUNTER — Inpatient Hospital Stay (HOSPITAL_COMMUNITY): Payer: Self-pay

## 2020-12-11 DIAGNOSIS — N179 Acute kidney failure, unspecified: Secondary | ICD-10-CM

## 2020-12-11 DIAGNOSIS — Z9911 Dependence on respirator [ventilator] status: Secondary | ICD-10-CM

## 2020-12-11 DIAGNOSIS — S2243XA Multiple fractures of ribs, bilateral, initial encounter for closed fracture: Secondary | ICD-10-CM

## 2020-12-11 DIAGNOSIS — K819 Cholecystitis, unspecified: Secondary | ICD-10-CM

## 2020-12-11 DIAGNOSIS — I469 Cardiac arrest, cause unspecified: Secondary | ICD-10-CM

## 2020-12-11 DIAGNOSIS — J9601 Acute respiratory failure with hypoxia: Secondary | ICD-10-CM

## 2020-12-11 DIAGNOSIS — R579 Shock, unspecified: Secondary | ICD-10-CM

## 2020-12-11 DIAGNOSIS — I5041 Acute combined systolic (congestive) and diastolic (congestive) heart failure: Secondary | ICD-10-CM

## 2020-12-11 DIAGNOSIS — J690 Pneumonitis due to inhalation of food and vomit: Secondary | ICD-10-CM

## 2020-12-11 DIAGNOSIS — E872 Acidosis: Secondary | ICD-10-CM

## 2020-12-11 DIAGNOSIS — Z8674 Personal history of sudden cardiac arrest: Secondary | ICD-10-CM

## 2020-12-11 DIAGNOSIS — I472 Ventricular tachycardia: Secondary | ICD-10-CM

## 2020-12-11 LAB — GLUCOSE, CAPILLARY
Glucose-Capillary: 117 mg/dL — ABNORMAL HIGH (ref 70–99)
Glucose-Capillary: 129 mg/dL — ABNORMAL HIGH (ref 70–99)
Glucose-Capillary: 153 mg/dL — ABNORMAL HIGH (ref 70–99)
Glucose-Capillary: 188 mg/dL — ABNORMAL HIGH (ref 70–99)
Glucose-Capillary: 213 mg/dL — ABNORMAL HIGH (ref 70–99)
Glucose-Capillary: 92 mg/dL (ref 70–99)

## 2020-12-11 LAB — CBC
HCT: 44.2 % (ref 39.0–52.0)
Hemoglobin: 14.2 g/dL (ref 13.0–17.0)
MCH: 28.2 pg (ref 26.0–34.0)
MCHC: 32.1 g/dL (ref 30.0–36.0)
MCV: 87.9 fL (ref 80.0–100.0)
Platelets: 187 10*3/uL (ref 150–400)
RBC: 5.03 MIL/uL (ref 4.22–5.81)
RDW: 15.2 % (ref 11.5–15.5)
WBC: 11.3 10*3/uL — ABNORMAL HIGH (ref 4.0–10.5)
nRBC: 0 % (ref 0.0–0.2)

## 2020-12-11 LAB — POCT I-STAT 7, (LYTES, BLD GAS, ICA,H+H)
Acid-base deficit: 4 mmol/L — ABNORMAL HIGH (ref 0.0–2.0)
Bicarbonate: 21.2 mmol/L (ref 20.0–28.0)
Calcium, Ion: 1.19 mmol/L (ref 1.15–1.40)
HCT: 40 % (ref 39.0–52.0)
Hemoglobin: 13.6 g/dL (ref 13.0–17.0)
O2 Saturation: 99 %
Patient temperature: 37.2
Potassium: 3.9 mmol/L (ref 3.5–5.1)
Sodium: 140 mmol/L (ref 135–145)
TCO2: 22 mmol/L (ref 22–32)
pCO2 arterial: 39.5 mmHg (ref 32.0–48.0)
pH, Arterial: 7.339 — ABNORMAL LOW (ref 7.350–7.450)
pO2, Arterial: 160 mmHg — ABNORMAL HIGH (ref 83.0–108.0)

## 2020-12-11 LAB — BASIC METABOLIC PANEL
Anion gap: 11 (ref 5–15)
BUN: 21 mg/dL (ref 8–23)
CO2: 18 mmol/L — ABNORMAL LOW (ref 22–32)
Calcium: 8.2 mg/dL — ABNORMAL LOW (ref 8.9–10.3)
Chloride: 107 mmol/L (ref 98–111)
Creatinine, Ser: 1.2 mg/dL (ref 0.61–1.24)
GFR, Estimated: >60 mL/min (ref >60)
Glucose, Bld: 290 mg/dL — ABNORMAL HIGH (ref 70–99)
Potassium: 5 mmol/L (ref 3.5–5.1)
Sodium: 136 mmol/L (ref 135–145)

## 2020-12-11 LAB — RAPID URINE DRUG SCREEN, HOSP PERFORMED
Amphetamines: NOT DETECTED
Barbiturates: NOT DETECTED
Benzodiazepines: POSITIVE — AB
Cocaine: NOT DETECTED
Opiates: NOT DETECTED
Tetrahydrocannabinol: NOT DETECTED

## 2020-12-11 LAB — ECHOCARDIOGRAM COMPLETE
Area-P 1/2: 2.95 cm2
Calc EF: 38.1 %
Height: 72 in
S' Lateral: 3.5 cm
Single Plane A2C EF: 39.8 %
Single Plane A4C EF: 40.5 %
Weight: 2504.43 oz

## 2020-12-11 LAB — URINALYSIS, ROUTINE W REFLEX MICROSCOPIC
Bilirubin Urine: NEGATIVE
Glucose, UA: >=500 mg/dL — AB
Ketones, ur: NEGATIVE mg/dL
Leukocytes,Ua: NEGATIVE
Nitrite: NEGATIVE
Protein, ur: 100 mg/dL — AB
Specific Gravity, Urine: 1.025 (ref 1.005–1.030)
pH: 5 (ref 5.0–8.0)

## 2020-12-11 LAB — TRIGLYCERIDES: Triglycerides: 60 mg/dL (ref <150)

## 2020-12-11 LAB — HEPARIN LEVEL (UNFRACTIONATED)
Heparin Unfractionated: 0.1 IU/mL — ABNORMAL LOW (ref 0.30–0.70)
Heparin Unfractionated: 0.34 IU/mL (ref 0.30–0.70)

## 2020-12-11 LAB — PHOSPHORUS: Phosphorus: 4.3 mg/dL (ref 2.5–4.6)

## 2020-12-11 LAB — HIV ANTIBODY (ROUTINE TESTING W REFLEX): HIV Screen 4th Generation wRfx: NONREACTIVE

## 2020-12-11 LAB — MAGNESIUM: Magnesium: 2 mg/dL (ref 1.7–2.4)

## 2020-12-11 LAB — LACTIC ACID, PLASMA: Lactic Acid, Venous: 2.2 mmol/L (ref 0.5–1.9)

## 2020-12-11 LAB — TROPONIN I (HIGH SENSITIVITY)
Troponin I (High Sensitivity): 465 ng/L (ref <18)
Troponin I (High Sensitivity): 2930 ng/L (ref <18)
Troponin I (High Sensitivity): 2837 ng/L (ref <18)

## 2020-12-11 LAB — HEMOGLOBIN A1C
Hgb A1c MFr Bld: 10.7 % — ABNORMAL HIGH (ref 4.8–5.6)
Mean Plasma Glucose: 260.39 mg/dL

## 2020-12-11 LAB — CBG MONITORING, ED: Glucose-Capillary: 283 mg/dL — ABNORMAL HIGH (ref 70–99)

## 2020-12-11 LAB — MRSA NEXT GEN BY PCR, NASAL: MRSA by PCR Next Gen: NOT DETECTED

## 2020-12-11 MED ORDER — DOCUSATE SODIUM 50 MG/5ML PO LIQD
100.0000 mg | Freq: Two times a day (BID) | ORAL | Status: DC | PRN
  Filled 2020-12-11: qty 10

## 2020-12-11 MED ORDER — HEPARIN BOLUS VIA INFUSION
2000.0000 [IU] | Freq: Once | INTRAVENOUS | Status: AC
  Administered 2020-12-11: 2000 [IU] via INTRAVENOUS
  Filled 2020-12-11: qty 2000

## 2020-12-11 MED ORDER — HEPARIN (PORCINE) 25000 UT/250ML-% IV SOLN
1150.0000 [IU]/h | INTRAVENOUS | Status: DC
  Administered 2020-12-11: 900 [IU]/h via INTRAVENOUS
  Administered 2020-12-11: 1150 [IU]/h via INTRAVENOUS
  Filled 2020-12-11 (×2): qty 250

## 2020-12-11 MED ORDER — POLYETHYLENE GLYCOL 3350 17 G PO PACK: 17.0000 g | PACK | Freq: Every day | ORAL | Status: DC | PRN

## 2020-12-11 MED ORDER — PIPERACILLIN-TAZOBACTAM 3.375 G IVPB 30 MIN
3.3750 g | Freq: Once | INTRAVENOUS | Status: AC
  Administered 2020-12-11: 3.375 g via INTRAVENOUS
  Filled 2020-12-11: qty 50

## 2020-12-11 MED ORDER — ORAL CARE MOUTH RINSE
15.0000 mL | OROMUCOSAL | Status: DC
  Administered 2020-12-11 (×6): 15 mL via OROMUCOSAL

## 2020-12-11 MED ORDER — NOREPINEPHRINE 4 MG/250ML-% IV SOLN
2.0000 ug/min | INTRAVENOUS | Status: DC
  Administered 2020-12-11: 5 ug/min via INTRAVENOUS
  Administered 2020-12-11: 7 ug/min via INTRAVENOUS
  Filled 2020-12-11: qty 250

## 2020-12-11 MED ORDER — LACTATED RINGERS IV SOLN: INTRAVENOUS | Status: DC

## 2020-12-11 MED ORDER — HEPARIN BOLUS VIA INFUSION
3000.0000 [IU] | Freq: Once | INTRAVENOUS | Status: AC
  Administered 2020-12-11: 3000 [IU] via INTRAVENOUS
  Filled 2020-12-11: qty 3000

## 2020-12-11 MED ORDER — CHLORHEXIDINE GLUCONATE 0.12% ORAL RINSE (MEDLINE KIT)
15.0000 mL | Freq: Two times a day (BID) | OROMUCOSAL | Status: DC
  Administered 2020-12-11 (×2): 15 mL via OROMUCOSAL

## 2020-12-11 MED ORDER — ORAL CARE MOUTH RINSE
15.0000 mL | Freq: Two times a day (BID) | OROMUCOSAL | Status: DC
  Administered 2020-12-11 – 2020-12-17 (×11): 15 mL via OROMUCOSAL

## 2020-12-11 MED ORDER — POLYETHYLENE GLYCOL 3350 17 G PO PACK
17.0000 g | PACK | Freq: Every day | ORAL | Status: DC
  Administered 2020-12-13 – 2020-12-14 (×2): 17 g via ORAL
  Filled 2020-12-11 (×5): qty 1

## 2020-12-11 MED ORDER — CALCIUM GLUCONATE-NACL 1-0.675 GM/50ML-% IV SOLN
1.0000 g | Freq: Once | INTRAVENOUS | Status: AC
  Administered 2020-12-11: 1000 mg via INTRAVENOUS
  Filled 2020-12-11: qty 50

## 2020-12-11 MED ORDER — ASPIRIN 81 MG PO CHEW
81.0000 mg | CHEWABLE_TABLET | Freq: Every day | ORAL | Status: DC
  Administered 2020-12-11: 81 mg
  Filled 2020-12-11: qty 1

## 2020-12-11 MED ORDER — ACETAMINOPHEN 160 MG/5ML PO SOLN: 650.0000 mg | Freq: Four times a day (QID) | ORAL | Status: DC | PRN

## 2020-12-11 MED ORDER — TRAMADOL HCL 50 MG PO TABS: 50.0000 mg | ORAL_TABLET | Freq: Four times a day (QID) | ORAL | Status: DC | PRN

## 2020-12-11 MED ORDER — AMIODARONE IV BOLUS ONLY 150 MG/100ML: 150.0000 mg | Freq: Once | INTRAVENOUS | Status: DC | PRN

## 2020-12-11 MED ORDER — SODIUM CHLORIDE 0.9 % IV SOLN
250.0000 mL | INTRAVENOUS | Status: DC
Start: 2020-12-11 — End: 2020-12-17
  Administered 2020-12-11: 250 mL via INTRAVENOUS

## 2020-12-11 MED ORDER — HEPARIN SODIUM (PORCINE) 5000 UNIT/ML IJ SOLN
5000.0000 [IU] | Freq: Three times a day (TID) | INTRAMUSCULAR | Status: DC
  Filled 2020-12-11: qty 1

## 2020-12-11 MED ORDER — PANTOPRAZOLE SODIUM 40 MG PO TBEC
40.0000 mg | DELAYED_RELEASE_TABLET | Freq: Every day | ORAL | Status: DC
  Administered 2020-12-12 – 2020-12-17 (×6): 40 mg via ORAL
  Filled 2020-12-11 (×6): qty 1

## 2020-12-11 MED ORDER — TRAMADOL HCL 50 MG PO TABS
50.0000 mg | ORAL_TABLET | Freq: Four times a day (QID) | ORAL | Status: DC | PRN
  Administered 2020-12-11 – 2020-12-15 (×6): 50 mg via ORAL
  Filled 2020-12-11 (×6): qty 1

## 2020-12-11 MED ORDER — CHLORHEXIDINE GLUCONATE CLOTH 2 % EX PADS
6.0000 | MEDICATED_PAD | Freq: Every day | CUTANEOUS | Status: DC
  Administered 2020-12-11 – 2020-12-16 (×4): 6 via TOPICAL

## 2020-12-11 MED ORDER — AMIODARONE IV BOLUS ONLY 150 MG/100ML
150.0000 mg | Freq: Once | INTRAVENOUS | Status: DC
  Filled 2020-12-11: qty 100

## 2020-12-11 MED ORDER — PIPERACILLIN-TAZOBACTAM 3.375 G IVPB
3.3750 g | Freq: Three times a day (TID) | INTRAVENOUS | Status: DC
  Administered 2020-12-11 – 2020-12-12 (×4): 3.375 g via INTRAVENOUS
  Filled 2020-12-11 (×4): qty 50

## 2020-12-11 MED ORDER — ASPIRIN 81 MG PO CHEW: 81.0000 mg | CHEWABLE_TABLET | Freq: Every day | ORAL | Status: DC

## 2020-12-11 MED ORDER — INSULIN ASPART 100 UNIT/ML IJ SOLN
0.0000 [IU] | INTRAMUSCULAR | Status: DC
  Administered 2020-12-11: 8 [IU] via SUBCUTANEOUS
  Administered 2020-12-11: 5 [IU] via SUBCUTANEOUS
  Administered 2020-12-11 (×2): 3 [IU] via SUBCUTANEOUS
  Administered 2020-12-12: 2 [IU] via SUBCUTANEOUS
  Administered 2020-12-12: 3 [IU] via SUBCUTANEOUS
  Administered 2020-12-12: 8 [IU] via SUBCUTANEOUS
  Administered 2020-12-13 (×2): 5 [IU] via SUBCUTANEOUS

## 2020-12-11 MED ORDER — DEXMEDETOMIDINE HCL IN NACL 400 MCG/100ML IV SOLN
0.0000 ug/kg/h | INTRAVENOUS | Status: DC
  Administered 2020-12-11: 0.4 ug/kg/h via INTRAVENOUS
  Filled 2020-12-11: qty 100

## 2020-12-11 NOTE — Progress Notes (Signed)
Pharmacy Antibiotic Note  James [name]. is a 60 y.o. male admitted on 12/10/2020 with  aspiration pneumonitis vs pneumonia .  Pharmacy has been consulted for Zosyn dosing.  Plan: Zosyn 3.375g IV q8h (4 hour infusion).   Temp (24hrs), Avg:96.8 F (36 C), Min:96.4 F (35.8 C), Max:97.6 F (36.4 C)  Recent Labs  Lab 12/10/20 2145 12/10/20 2154  WBC 6.6  --   CREATININE 1.44* 1.30*  LATICACIDVEN 7.9*  --     Estimated Creatinine Clearance: 61.5 mL/min (A) (by C-G formula based on SCr of 1.3 mg/dL (H)).    No Known Allergies   Thank you for allowing pharmacy to be a part of this patient's care.  Vernard Gambles, PharmD, BCPS  12/11/2020 12:22 AM

## 2020-12-11 NOTE — Progress Notes (Signed)
eLink Physician-Brief Progress Note Patient Name: James [name]. DOB: May 10, 1960 MRN: [id number]   Date of Service  12/11/2020  HPI/Events of Note  Patient has a scheduled Amiodarone bolus, heart rate currently 70's.  eICU Interventions  Amiodarone bolus discontinued.        James [name] 12/11/2020, 2:03 AM

## 2020-12-11 NOTE — Progress Notes (Addendum)
NAME:  James Bir., MRN:  [id number], DOB:  Jun 13, 1960, LOS: 0 ADMISSION DATE:  12/10/2020, CONSULTATION DATE:  12/10/20 REFERRING MD:  [facility]- ED, CHIEF COMPLAINT:  cardiac arrest   History of Present Illness:  James [name] is a 60 y/o gentleman with a history of NICM with recovered EF, HTN, DM who presented post-cardiac arrest. He was watching TV with his daughter and dozed off. He began foaming at the mouth and she called 911 and initiated resuscitation. EMS arrived in 3 minutes and he was in VT/VF. He received defibrillations amiodarone 453m total, and epinephrine x 4 doses. He was intubated in the field and had achieved ROSC upon arrival to the ED. He had not complained of new or unusual symptoms leading up to this. In the ED he required sedation due to fighting restraints and attempting to self-extubate. He is currently on a NE infusions. His daughter and brother are at bedside.   Pertinent  Medical History  DM HTN NICM> recovered EF Cocaine abuse  Significant Hospital Events: Including procedures, antibiotic start and stop dates in addition to other pertinent events   8/31 admitted  Interim History / Subjective:    Objective   Blood pressure (!) 134/106, pulse 100, temperature (!) 97.4 F (36.3 C), temperature source Bladder, resp. rate 18, height 6' (1.829 m), weight 71 kg, SpO2 99 %.    Vent Mode: PRVC FiO2 (%):  [30 %-100 %] 30 % Set Rate:  [18 bmp] 18 bmp Vt Set:  [530 mL] 530 mL PEEP:  [5 cmH20] 5 cmH20 Plateau Pressure:  [16 cmH20-19 cmH20] 17 cmH20   Intake/Output Summary (Last 24 hours) at 12/11/2020 0859 Last data filed at 12/11/2020 0800 Gross per 24 hour  Intake 879.25 ml  Output 1785 ml  Net -905.75 ml   Filed Weights   12/11/20 0015 12/11/20 0100  Weight: 73.8 kg 71 kg    Examination: General:  critically ill appearing on mech vent HEENT: MM pink/moist; ETT in place Neuro: sedated; cough/gag reflex in tact; PERRL and pupils 24mbilaterally;  CV:  s1s2, afib with rate 90s, no m/r/g PULM:  dim clear BS bilaterally; on mech vent PRVC 40% GI: soft, bsx4 active  Extremities: warm/dry, no edema  Skin: no rashes or lesions    Labs: WBC 11.3 AST 579, ALT 684 Lactic 2.2 from 7.9 Troponin 465 from 74 (repeat pending) Mag 2.1 Ionized calcium 1.04 (repleted; repeat lab pending) Co2 18 Glucose 290; a1c 10.7 Abg 7.33, 39, 160, 21  CT Abdomen: b/l lower lobes aspiration penumonia vs pneumonititis. Cholelithiasis with findings suggestive of acute cholecystitis.urinary bladder wall thickening (malignancy or infection). Left 2nd-7th acute rib fractures and right 3rd-5th rib fractures s/p cpr. Indeterminate 1.6 cm right renal lesion (recommend MRI when awake and following commands) CT head: negative Ultrasound: Edematous gallbladder wall without evidence of cholelithiasis or acute cholecystitis. Further evaluation with a nuclear medicine hepatobiliary scan is recommended if this remains of clinical concern.   Resolved Hospital Problem list     Assessment & Plan:  Cardiac arrest-- witnessed with bystander CPT, VT/VF. I suspect he had a primary arrhythmia related to chronic CM given lack of preceding symptoms and EKG without ischemic changes. P: -continue telemetry monitoring -will recheck EKG -trend electrolytes and replete as needed -continue levo for MAP goal >65 -continue heparin; repeat troponin pending (low suspicion for ACS, likely s/p cardiac arrest) -will call cardiology for consult and possible heart cath -echo pending -consider amiodarone infusion if hemodynamically worsens  Shock- presumed mixed cardiogenic and distributive from sedation P: -wean levo for MAP >65 -continue zosyn for aspiration pneumonia coverage -limit sedation for RASS 0 to -1   Acute respiratory failure with hypoxia Aspiration pneumonitis vs pneumonia P: -continue mech vent PRVC 6-8 cc/kg -wean fio2 for sats >92% -vap prevention in place -wean  sedation (prop/fent) for RASS 0 to -1 -consider precedex if unable to wake up patient without agitation (hx of alcohol abuse) -consider SBT later today -tracheal aspirate ordered; continue zosyn   Rib fractures- multiple bilaterally s/p cpr P: -may need nerve block for pain control if pain limits vent weaning  AKI: improving P: -trend bmp/uop -renally dose meds, avoid nephrotoxic meds  Elevated transaminases due to cardiac arrest P: -trend cmp  Polysubstance abuse hx (alcohol and cocaine): UDS positive for benzos (likely given in ED) P: -monitor for signs of withdrawals -avoid beta blockers  Lactic acidosis: Improving P: -maintain adequate perfusion  Possible cholecystitis: [place]: without evidence of cholelithiasis or acute cholecystitis. Further evaluation with a nuclear medicine hepatobiliary scan is recommended if this remains of clinical concern. P: -trend cbc/fever -continue zosyn -consider hepatobiliary scan if continue to clinically decline  DM with hyperglycemia; prev A1c 9.7 in 07/2020 P: -SSI and CBG monitoring  Abnormal bladder on CT, renal 1.6cm lesion P: -consider OP urology consult -radiology recommending eventual MRI of R renal lesion for better characterization when patient awake and following commands  Best Practice (right click and "Reselect all SmartList Selections" daily)   Diet/type: NPO DVT prophylaxis: prophylactic heparin  GI prophylaxis: PPI Lines: N/A Foley:  N/A Code Status:  full code Last date of multidisciplinary goals of care discussion (daughter updated at bedside 9/1]  Labs   CBC: Recent Labs  Lab 12/10/20 2145 12/10/20 2154 12/10/20 2313 12/11/20 0151 12/11/20 0458  WBC 6.6  --   --  11.3*  --   NEUTROABS 1.9  --   --   --   --   HGB 13.4 14.6 13.9 14.2 13.6  HCT 44.3 43.0 41.0 44.2 40.0  MCV 92.7  --   --  87.9  --   PLT 212  --   --  187  --      Basic Metabolic Panel: Recent Labs  Lab 12/10/20 2145  12/10/20 2154 12/10/20 2313 12/11/20 0151 12/11/20 0458  NA 139 139 136 136 140  K 4.4 4.2 4.5 5.0 3.9  CL 106 105  --  107  --   CO2 17*  --   --  18*  --   GLUCOSE 317* 298*  --  290*  --   BUN 20 23  --  21  --   CREATININE 1.44* 1.30*  --  1.20  --   CALCIUM 8.3*  --   --  8.2*  --   MG 2.1  --   --  2.0  --   PHOS  --   --   --  4.3  --     GFR: Estimated Creatinine Clearance: 64.1 mL/min (by C-G formula based on SCr of 1.2 mg/dL). Recent Labs  Lab 12/10/20 2145 12/11/20 0151  WBC 6.6 11.3*  LATICACIDVEN 7.9* 2.2*     Liver Function Tests: Recent Labs  Lab 12/10/20 2145  AST 579*  ALT 684*  ALKPHOS 116  BILITOT 0.9  PROT 5.6*  ALBUMIN 3.2*    No results for input(s): LIPASE, AMYLASE in the last 168 hours. No results for input(s): AMMONIA in the last 168  hours.  ABG    Component Value Date/Time   PHART 7.339 (L) 12/11/2020 0458   PCO2ART 39.5 12/11/2020 0458   PO2ART 160 (H) 12/11/2020 0458   HCO3 21.2 12/11/2020 0458   TCO2 22 12/11/2020 0458   ACIDBASEDEF 4.0 (H) 12/11/2020 0458   O2SAT 99.0 12/11/2020 0458      Coagulation Profile: No results for input(s): INR, PROTIME in the last 168 hours.  Cardiac Enzymes: No results for input(s): CKTOTAL, CKMB, CKMBINDEX, TROPONINI in the last 168 hours.  HbA1C: HbA1c, POC (controlled diabetic range)  Date/Time Value Ref Range Status  07/29/2020 04:34 PM 9.7 (A) 0.0 - 7.0 % Final  05/01/2020 04:26 PM 9.8 (A) 0.0 - 7.0 % Final   Hgb A1c MFr Bld  Date/Time Value Ref Range Status  12/11/2020 01:51 AM 10.7 (H) 4.8 - 5.6 % Final    Comment:    (NOTE) Pre diabetes:          5.7%-6.4%  Diabetes:              >6.4%  Glycemic control for   <7.0% adults with diabetes     CBG: Recent Labs  Lab 12/11/20 0011 12/11/20 0403 12/11/20 0817  GLUCAP 283* 213* 117*    Review of Systems:   Unable to be obtained due to mental status, intubated.  Past Medical History:  He,  has a past medical history  of Cellulitis and abscess of foot (88/50/2774), Chronic systolic heart failure ([place]), Cocaine abuse ([place]), Cocaine dependence with cocaine-induced mood disorder ([place]), Depression, Diabetes mellitus without complication ([place]), Essential hypertension, NICM (nonischemic cardiomyopathy) ([place]) EF 20-25%, and Suicidal ideations (07/12/2017).   Surgical History:   Past Surgical History:  Procedure Laterality Date   CARDIAC CATHETERIZATION N/A 04/30/2016   Procedure: Right/Left Heart Cath and Coronary Angiography;  Surgeon: Nelva Bush, MD;  Location: [facility];  Service: Cardiovascular;  Laterality: N/A;   FINGER SURGERY       Social History:   reports that he has been smoking cigarettes. He has a 11.50 pack-year smoking history. He has never used smokeless tobacco. He reports current alcohol use. He reports that he does not currently use drugs after having used the following drugs: Cocaine and "Crack" cocaine.   Family History:  His family history includes Brain cancer in his father; Diabetes in his brother, mother, and another family member; Drug abuse in his brother; Hypertension in an other family member; Mental illness in his brother.   Allergies No Known Allergies   Home Medications  Prior to Admission medications   Medication Sig Start Date End Date Taking? Authorizing Provider  aspirin 81 MG chewable tablet Chew 81 mg by mouth daily. 1Daily    [provider]  atorvastatin (LIPITOR) 40 MG tablet TAKE 1 TABLET BY MOUTH DAILY FOR HIGH CHOLESTEROL 07/29/20 07/29/21  Elsie Stain, MD  blood glucose meter kit and supplies Dispense based on patient and insurance preference. Use up to four times daily as directed. (FOR ICD-9 250.00, 250.01). 07/17/19   Mayers, Cari S, PA-C  Blood Glucose Monitoring Suppl (TRUE METRIX METER) w/Device KIT Use to measure blood sugar twice a day 12/11/19   Elsie Stain, MD  cholecalciferol (VITAMIN D3) 25 MCG (1000 UNIT) tablet Take 1,000  Units by mouth daily.    [provider]  dapagliflozin propanediol (FARXIGA) 5 MG TABS tablet Take 1 tablet (5 mg total) by mouth daily before breakfast. 11/10/20   Elsie Stain, MD  furosemide (LASIX)  20 MG tablet Take 1 tablet (20 mg total) by mouth daily as needed. 09/16/20 09/16/21  Lelon Perla, MD  glucose blood test strip USE TO CHECK BLOOD SUGARS 2 TIMES DAILY 07/29/20   Elsie Stain, MD  insulin glargine (LANTUS SOLOSTAR) 100 UNIT/ML Solostar Pen Inject 14 Units into the skin at bedtime. Patient not taking: Reported on 09/16/2020 08/13/20   Elsie Stain, MD  insulin glargine (LANTUS SOLOSTAR) 100 UNIT/ML Solostar Pen Inject 12 Units into the skin daily.    [provider]  Insulin Syringe 27G X 1/2" 0.5 ML MISC Use with lantus 12/11/19   Elsie Stain, MD  lisinopril (ZESTRIL) 10 MG tablet TAKE 1 TABLET (10 MG TOTAL) BY MOUTH DAILY. 07/29/20 07/29/21  Elsie Stain, MD  metFORMIN (GLUCOPHAGE) 1000 MG tablet TAKE 1 TABLET BY MOUTH TWICE DAILY WITH A MEAL FOR DIABETES MANAGMEMENT 12/03/20 12/03/21  Elsie Stain, MD  sildenafil (VIAGRA) 100 MG tablet TAKE 0.5-1 TABLETS (50-100 MG TOTAL) BY MOUTH DAILY AS NEEDED FOR ERECTILE DYSFUNCTION. 12/27/19 01/04/21  Elsie Stain, MD  triamcinolone cream (KENALOG) 0.1 % Apply 1 application topically 2 (two) times daily. Mix with skin moisturizer and apply to right small toe 07/29/20   Elsie Stain, MD  TRUEplus Lancets 28G MISC Use to measure blood sugar twice a day 07/29/20   Elsie Stain, MD     Critical care time: 35 min.     JD Rexene Agent [facility] 12/11/2020, 11:39 AM  Please see [email or web address] for pager details.  From 7A-7P if no response, please call [phone]. After hours, please call ELink [phone].

## 2020-12-11 NOTE — Progress Notes (Signed)
ANTICOAGULATION CONSULT NOTE - Initial Consult  Pharmacy Consult for heparin Indication:  r/o ACS (s/p VT/VF arrest)  No Known Allergies  Patient Measurements: Height: 6' (182.9 cm) Weight: 73.8 kg (162 lb 11.2 oz) IBW/kg (Calculated) : 77.6  Vital Signs: Temp: 97.6 F (36.4 C) (09/01 0015) BP: 128/74 (09/01 0015) Pulse Rate: 85 (09/01 0015)  Labs: Recent Labs    12/10/20 2145 12/10/20 2154 12/10/20 2313  HGB 13.4 14.6 13.9  HCT 44.3 43.0 41.0  PLT 212  --   --   CREATININE 1.44* 1.30*  --   TROPONINIHS 74*  --   --     Estimated Creatinine Clearance: 61.5 mL/min (A) (by C-G formula based on SCr of 1.3 mg/dL (H)).   Medical History: Past Medical History:  Diagnosis Date   Cellulitis and abscess of foot 09/29/2016   Chronic systolic heart failure (HCC)    Cocaine abuse (HCC)    Cocaine dependence with cocaine-induced mood disorder (HCC)    Depression    Diabetes mellitus without complication (HCC)    Essential hypertension    NICM (nonischemic cardiomyopathy) (HCC) EF 20-25%    Suicidal ideations 07/12/2017    Assessment: 60yo male w/ h/o cocaine abuse presents to ED after VT/VF arrest >> concern for ACS, to begin heparin.  Goal of Therapy:  Heparin level 0.3-0.7 units/ml Monitor platelets by anticoagulation protocol: Yes   Plan:  Heparin 3000 units IV bolus x1 followed by infusion at 900 units/hr and monitor heparin levels and CBC.  Vernard Gambles, PharmD, BCPS  12/11/2020,12:36 AM

## 2020-12-11 NOTE — Progress Notes (Addendum)
ANTICOAGULATION CONSULT NOTE - Initial Consult  Pharmacy Consult for heparin Indication:  r/o ACS (s/p VT/VF arrest)  No Known Allergies  Patient Measurements: Height: 6' (182.9 cm) Weight: 71 kg (156 lb 8.4 oz) IBW/kg (Calculated) : 77.6  Vital Signs: Temp: 99.1 F (37.3 C) (09/01 0600) Temp Source: Bladder (09/01 0400) BP: 114/71 (09/01 0748) Pulse Rate: 83 (09/01 0700)  Labs: Recent Labs    12/10/20 2145 12/10/20 2154 12/10/20 2313 12/11/20 0029 12/11/20 0151 12/11/20 0458 12/11/20 0714  HGB 13.4 14.6 13.9  --  14.2 13.6  --   HCT 44.3 43.0 41.0  --  44.2 40.0  --   PLT 212  --   --   --  187  --   --   HEPARINUNFRC  --   --   --   --   --   --  0.10*  CREATININE 1.44* 1.30*  --   --  1.20  --   --   TROPONINIHS 74*  --   --  465*  --   --   --      Estimated Creatinine Clearance: 64.1 mL/min (by C-G formula based on SCr of 1.2 mg/dL).   Medical History: Past Medical History:  Diagnosis Date   Cellulitis and abscess of foot 09/29/2016   Chronic systolic heart failure (HCC)    Cocaine abuse (HCC)    Cocaine dependence with cocaine-induced mood disorder (HCC)    Depression    Diabetes mellitus without complication (HCC)    Essential hypertension    NICM (nonischemic cardiomyopathy) (HCC) EF 20-25%    Suicidal ideations 07/12/2017    Assessment: 60yo male w/ h/o cocaine abuse presents to ED after VT/VF arrest with concern for ACS. Pharmacy to begin heparin dosing. No reports of bleeding per nursing. H/H/plts stable this morning.  Goal of Therapy:  Heparin level 0.3-0.7 units/ml Monitor platelets by anticoagulation protocol: Yes   Plan:  Heparin 2000 units IV bolus x1 followed by infusion at 1150 units/hr  Check heparin level in 6-8 hours Monitor daily heparin level and CBC.  Thank you for allowing pharmacy to participate in this patient's care.  Enos Fling, PharmD PGY1 Pharmacy Resident 12/11/2020 8:42 AM Check [email or web address] for unit specific pharmacy  number

## 2020-12-11 NOTE — Progress Notes (Deleted)
ANTICOAGULATION CONSULT NOTE   Pharmacy Consult for heparin Indication:  r/o ACS (s/p VT/VF arrest)  No Known Allergies  Patient Measurements: Height: 6' (182.9 cm) Weight: 71 kg (156 lb 8.4 oz) IBW/kg (Calculated) : 77.6  Vital Signs: Temp: 97.4 F (36.3 C) (09/01 0800) Temp Source: Bladder (09/01 0800) BP: 134/106 (09/01 0815) Pulse Rate: 100 (09/01 0830)  Labs: Recent Labs    12/10/20 2145 12/10/20 2154 12/10/20 2313 12/11/20 0029 12/11/20 0151 12/11/20 0458 12/11/20 0714  HGB 13.4 14.6 13.9  --  14.2 13.6  --   HCT 44.3 43.0 41.0  --  44.2 40.0  --   PLT 212  --   --   --  187  --   --   HEPARINUNFRC  --   --   --   --   --   --  0.10*  CREATININE 1.44* 1.30*  --   --  1.20  --   --   TROPONINIHS 74*  --   --  465*  --   --   --      Estimated Creatinine Clearance: 64.1 mL/min (by C-G formula based on SCr of 1.2 mg/dL).   Medical History: Past Medical History:  Diagnosis Date   Cellulitis and abscess of foot 09/29/2016   Chronic systolic heart failure (HCC)    Cocaine abuse (HCC)    Cocaine dependence with cocaine-induced mood disorder (HCC)    Depression    Diabetes mellitus without complication (HCC)    Essential hypertension    NICM (nonischemic cardiomyopathy) (HCC) EF 20-25%    Suicidal ideations 07/12/2017    Assessment: 60yo male w/ h/o cocaine abuse presents to ED after VT/VF arrest >> concern for ACS, to begin heparin.  Heparin level below goal this morning (0.10) on 900 units/hr. Hemoglobin stable in the 13s, platelet count normal. No bleeding issues noted.   Goal of Therapy:  Heparin level 0.3-0.7 units/ml Monitor platelets by anticoagulation protocol: Yes   Plan:  Heparin 2000 unit bolus then increase infusion to 1100 units/hr Recheck heparin level this afternoon  Sheppard Coil PharmD., BCPS Clinical Pharmacist 12/11/2020 8:45 AM

## 2020-12-11 NOTE — H&P (Addendum)
NAME:  James Petropoulos., MRN:  [id number], DOB:  11-01-1960, LOS: 0 ADMISSION DATE:  12/10/2020, CONSULTATION DATE:  12/10/20 REFERRING MD:  [facility]- ED, CHIEF COMPLAINT:  cardiac arrest   History of Present Illness:  James [name] is a 60 y/o gentleman with a history of NICM with recovered EF, HTN, DM who presented post-cardiac arrest. He was watching TV with his daughter and dozed off. He began foaming at the mouth and she called 911 and initiated resuscitation. EMS arrived in 3 minutes and he was in VT/VF. He received defibrillations amiodarone 438m total, and epinephrine x 4 doses. He was intubated in the field and had achieved ROSC upon arrival to the ED. He had not complained of new or unusual symptoms leading up to this. In the ED he required sedation due to fighting restraints and attempting to self-extubate. He is currently on a NE infusions. His daughter and brother are at bedside.   Pertinent  Medical History  DM HTN NICM> recovered EF Cocaine abuse  Significant Hospital Events: Including procedures, antibiotic start and stop dates in addition to other pertinent events   8/31 admitted  Interim History / Subjective:    Objective   Blood pressure 126/69, pulse 86, temperature (!) 97.3 F (36.3 C), resp. rate 16, SpO2 100 %.    Vent Mode: PRVC FiO2 (%):  [100 %] 100 % Set Rate:  [18 bmp] 18 bmp Vt Set:  [530 mL] 530 mL PEEP:  [5 cmH20] 5 cmH20 Plateau Pressure:  [16 cmH20] 16 cmH20  No intake or output data in the 24 hours ending 12/11/20 0001 There were no vitals filed for this visit.  Examination: General: critically ill appearing man lying in bed in NAD, intubated and sedated on propofol HENT: [place]/AT, eyes anicteric. ETT in place, poor dentition with several broken teeth. Lungs: CTAB, Pplat 17 Cardiovascular: S1S2, RRR Abdomen: soft, ND Extremities: no peripheral edema, no cyanosis or clubbing Neuro: RASS -5, pinpoint pupils, bites on suction catheter but no  pharyngeal gag Derm: warm, dry, no rashes  CT personally reviewed> bibasilar infiltrates suggestive of aspiration, cardiomegaly, dilated GB  EKG NSR with PVCs, normal axis. Normal intervals. Flattening of TW in lateral precordials but no ischemic ST changes.  Covid pending Labs reviewed. Notable for LA 7.9, trop 74, Cr 1.44. Normal CBC.  Resolved Hospital Problem list     Assessment & Plan:  Cardiac arrest-- witnessed with bystander CPT, VT/VF. I suspect he had a primary arrhythmia related to chronic CM given lack of preceding symptoms and EKG without ischemic changes. -not requiring TTM due to purposeful neurological activity in the ED -optimize electrolytes -tele monitoring -supportive care -echo  -UDS ordered (has received opiates in ED) -serial trops -Empiric heparin until troponin trend is confirmed, but low suspicion for ACS. Daily ASA.  -amiodarone infusion; no additional bolus needed  Shock- presumed mixed cardiogenic and distributive from sedation -NE to maintain MAP> 65; wean as able -broad spectrum antibiotics empirically -may do better with versed than propofol, but would prefer to avoid benzos for neuroprognostication  Acute respiratory failure with hypoxia Aspiration pneumonitis vs pneumonia -LTVV, 4-8cc/kg IBW with goal Pplat<30 and DP<15 -VAP prevention protocol -PAD protocol for sedation-- currently on propofol and fentanyl. Adding PRN versed. -daily SAT & SBT as appropriate -titrate down FiO2 as able  Rib fractures- multiple bilaterally -may need nerve block for pain control if pain limits vent weaning  AKI -monitor renal function -renally dose meds, avoid nephrotoxic meds -strict I/Os  Elevated transaminases due to cardiac arrest -monitor  Lactic acidosis -monitor -maintain adequate perfusion  Possible cholecystitis, calcified gall stone -call IR in AM for perc biliary drain; without symptoms preceding his arrest, this is unlikely the cause of  his critical illness -zosyn  DM with hyperglycemia; prev A1c 9.7 in 07/2020 -recheck A1c -SSI PRN  Abnormal bladder on CT, renal 1.6cm lesion -needs OP urology follow up -UA -radiology recommending eventual MRI of R renal lesion for better characterization  Best Practice (right click and "Reselect all SmartList Selections" daily)   Diet/type: NPO DVT prophylaxis: prophylactic heparin  GI prophylaxis: PPI Lines: N/A Foley:  N/A Code Status:  full code Last date of multidisciplinary goals of care discussion [daughters updated in ED (daughter Alana via phone, who would like to be notified of changes and be the primary contact)]  Labs   CBC: Recent Labs  Lab 12/10/20 2145 12/10/20 2154 12/10/20 2313  WBC 6.6  --   --   NEUTROABS 1.9  --   --   HGB 13.4 14.6 13.9  HCT 44.3 43.0 41.0  MCV 92.7  --   --   PLT 212  --   --     Basic Metabolic Panel: Recent Labs  Lab 12/10/20 2145 12/10/20 2154 12/10/20 2313  NA 139 139 136  K 4.4 4.2 4.5  CL 106 105  --   CO2 17*  --   --   GLUCOSE 317* 298*  --   BUN 20 23  --   CREATININE 1.44* 1.30*  --   CALCIUM 8.3*  --   --   MG 2.1  --   --    GFR: CrCl cannot be calculated (Unknown ideal weight.). Recent Labs  Lab 12/10/20 2145  WBC 6.6  LATICACIDVEN 7.9*    Liver Function Tests: Recent Labs  Lab 12/10/20 2145  AST 579*  ALT 684*  ALKPHOS 116  BILITOT 0.9  PROT 5.6*  ALBUMIN 3.2*   No results for input(s): LIPASE, AMYLASE in the last 168 hours. No results for input(s): AMMONIA in the last 168 hours.  ABG    Component Value Date/Time   PHART 7.291 (L) 12/10/2020 2313   PCO2ART 40.1 12/10/2020 2313   PO2ART 171 (H) 12/10/2020 2313   HCO3 19.5 (L) 12/10/2020 2313   TCO2 21 (L) 12/10/2020 2313   ACIDBASEDEF 7.0 (H) 12/10/2020 2313   O2SAT 99.0 12/10/2020 2313     Coagulation Profile: No results for input(s): INR, PROTIME in the last 168 hours.  Cardiac Enzymes: No results for input(s): CKTOTAL,  CKMB, CKMBINDEX, TROPONINI in the last 168 hours.  HbA1C: HbA1c, POC (controlled diabetic range)  Date/Time Value Ref Range Status  07/29/2020 04:34 PM 9.7 (A) 0.0 - 7.0 % Final  05/01/2020 04:26 PM 9.8 (A) 0.0 - 7.0 % Final    CBG: No results for input(s): GLUCAP in the last 168 hours.  Review of Systems:   Unable to be obtained due to mental status, intubated.  Past Medical History:  He,  has a past medical history of Cellulitis and abscess of foot (83/33/8329), Chronic systolic heart failure ([place]), Cocaine abuse ([place]), Cocaine dependence with cocaine-induced mood disorder ([place]), Depression, Diabetes mellitus without complication ([place]), Essential hypertension, NICM (nonischemic cardiomyopathy) ([place]) EF 20-25%, and Suicidal ideations (07/12/2017).   Surgical History:   Past Surgical History:  Procedure Laterality Date   CARDIAC CATHETERIZATION N/A 04/30/2016   Procedure: Right/Left Heart Cath and Coronary Angiography;  Surgeon: Nelva Bush, MD;  Location: [facility];  Service: Cardiovascular;  Laterality: N/A;   FINGER SURGERY       Social History:   reports that he has been smoking cigarettes. He has a 11.50 pack-year smoking history. He has never used smokeless tobacco. He reports current alcohol use. He reports that he does not currently use drugs after having used the following drugs: Cocaine and "Crack" cocaine.   Family History:  His family history includes Brain cancer in his father; Diabetes in his brother, mother, and another family member; Drug abuse in his brother; Hypertension in an other family member; Mental illness in his brother.   Allergies No Known Allergies   Home Medications  Prior to Admission medications   Medication Sig Start Date End Date Taking? Authorizing Provider  aspirin 81 MG chewable tablet Chew 81 mg by mouth daily. 1Daily    [provider]  atorvastatin (LIPITOR) 40 MG tablet TAKE 1 TABLET BY MOUTH DAILY FOR HIGH CHOLESTEROL  07/29/20 07/29/21  Elsie Stain, MD  blood glucose meter kit and supplies Dispense based on patient and insurance preference. Use up to four times daily as directed. (FOR ICD-9 250.00, 250.01). 07/17/19   Mayers, Cari S, PA-C  Blood Glucose Monitoring Suppl (TRUE METRIX METER) w/Device KIT Use to measure blood sugar twice a day 12/11/19   Elsie Stain, MD  cholecalciferol (VITAMIN D3) 25 MCG (1000 UNIT) tablet Take 1,000 Units by mouth daily.    [provider]  dapagliflozin propanediol (FARXIGA) 5 MG TABS tablet Take 1 tablet (5 mg total) by mouth daily before breakfast. 11/10/20   Elsie Stain, MD  furosemide (LASIX) 20 MG tablet Take 1 tablet (20 mg total) by mouth daily as needed. 09/16/20 09/16/21  Lelon Perla, MD  glucose blood test strip USE TO CHECK BLOOD SUGARS 2 TIMES DAILY 07/29/20   Elsie Stain, MD  insulin glargine (LANTUS SOLOSTAR) 100 UNIT/ML Solostar Pen Inject 14 Units into the skin at bedtime. Patient not taking: Reported on 09/16/2020 08/13/20   Elsie Stain, MD  insulin glargine (LANTUS SOLOSTAR) 100 UNIT/ML Solostar Pen Inject 12 Units into the skin daily.    [provider]  Insulin Syringe 27G X 1/2" 0.5 ML MISC Use with lantus 12/11/19   Elsie Stain, MD  lisinopril (ZESTRIL) 10 MG tablet TAKE 1 TABLET (10 MG TOTAL) BY MOUTH DAILY. 07/29/20 07/29/21  Elsie Stain, MD  metFORMIN (GLUCOPHAGE) 1000 MG tablet TAKE 1 TABLET BY MOUTH TWICE DAILY WITH A MEAL FOR DIABETES MANAGMEMENT 12/03/20 12/03/21  Elsie Stain, MD  sildenafil (VIAGRA) 100 MG tablet TAKE 0.5-1 TABLETS (50-100 MG TOTAL) BY MOUTH DAILY AS NEEDED FOR ERECTILE DYSFUNCTION. 12/27/19 01/04/21  Elsie Stain, MD  triamcinolone cream (KENALOG) 0.1 % Apply 1 application topically 2 (two) times daily. Mix with skin moisturizer and apply to right small toe 07/29/20   Elsie Stain, MD  TRUEplus Lancets 28G MISC Use to measure blood sugar twice a day 07/29/20   Elsie Stain, MD     Critical care time: 50 min.     Julian Hy, DO 12/11/20 12:25 AM [facility]

## 2020-12-11 NOTE — Progress Notes (Signed)
  Echocardiogram 2D Echocardiogram has been performed.  James [name] 12/11/2020, 10:29 AM

## 2020-12-11 NOTE — Consult Note (Addendum)
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 focal consolidation, pleural effusion or pneumothorax. No acute osseous pathology. IMPRESSION: 1. Endotracheal tube above the carina. 2. Cardiomegaly with vascular congestion. Electronically Signed   By: Anner Crete M.D.   On: 12/10/2020 22:13   ECHOCARDIOGRAM COMPLETE  Result Date: 12/11/2020    ECHOCARDIOGRAM REPORT   Patient Name:   SKEETER SHEARD Date of Exam: 12/11/2020 Medical Rec #:  [id number]        Height:       72.0 in Accession #:    [id number]       Weight:       156.5 lb Date of Birth:  1960/08/05        BSA:          1.920 m Patient Age:    43 years         BP:  114/71 mmHg Patient Gender: M                HR:           97 bpm. Exam Location:  Inpatient Procedure: 2D Echo, Cardiac Doppler and Color Doppler Indications:    Cardiac arrest ([place]) [427.5.ICD-9-CM]  History:        Patient has prior history of Echocardiogram examinations, most                 recent 10/14/2016.  Cardiomyopathy; Risk Factors:Diabetes and                 Hypertension.  Sonographer:    Bernadene Person RDCS Referring Phys: [id number] RAHUL P DESAI IMPRESSIONS  1. Left ventricular ejection fraction, by estimation, is 40 to 45%. The left ventricle has mildly decreased function. The left ventricle demonstrates global hypokinesis. Left ventricular diastolic parameters were normal.  2. Right ventricular systolic function is normal. The right ventricular size is normal. There is normal pulmonary artery systolic pressure.  3. The mitral valve is normal in structure. Trivial mitral valve regurgitation. No evidence of mitral stenosis.  4. The aortic valve is normal in structure. Aortic valve regurgitation is not visualized. No aortic stenosis is present.  5. The inferior vena cava is normal in size with greater than 50% respiratory variability, suggesting right atrial pressure of 3 mmHg. FINDINGS  Left Ventricle: Left ventricular ejection fraction, by estimation, is 40 to 45%. The left ventricle has mildly decreased function. The left ventricle demonstrates global hypokinesis. The left ventricular internal cavity size was normal in size. There is  no left ventricular hypertrophy. Left ventricular diastolic parameters were normal. Right Ventricle: The right ventricular size is normal. No increase in right ventricular wall thickness. Right ventricular systolic function is normal. There is normal pulmonary artery systolic pressure. The tricuspid regurgitant velocity is 1.44 m/s, and  with an assumed right atrial pressure of 3 mmHg, the estimated right ventricular systolic pressure is 47.4 mmHg. Left Atrium: Left atrial size was normal in size. Right Atrium: Right atrial size was normal in size. Pericardium: There is no evidence of pericardial effusion. Mitral Valve: The mitral valve is normal in structure. Trivial mitral valve regurgitation. No evidence of mitral valve stenosis. Tricuspid Valve: The tricuspid valve is normal  in structure. Tricuspid valve regurgitation is not demonstrated. No evidence of tricuspid stenosis. Aortic Valve: The aortic valve is normal in structure. Aortic valve regurgitation is not visualized. No aortic stenosis is present. Pulmonic Valve: The pulmonic valve was normal in structure. Pulmonic valve regurgitation is not visualized. No evidence of pulmonic stenosis. Aorta: The aortic root is normal in size and structure. Venous: The inferior vena cava is normal in size with greater than 50% respiratory variability, suggesting right atrial pressure of 3 mmHg. IAS/Shunts: No atrial level shunt detected by color flow Doppler.  LEFT VENTRICLE PLAX 2D LVIDd:         5.00 cm      Diastology LVIDs:         3.50 cm      LV e' medial:    6.58 cm/s LV PW:         1.00 cm      LV E/e' medial:  12.6 LV IVS:        1.00 cm      LV e' lateral:   6.19 cm/s LVOT diam:     2.20 cm      LV E/e' lateral: 13.4 LV  SV:         68 LV SV Index:   35 LVOT Area:     3.80 cm  LV Volumes (MOD) LV vol d, MOD A2C: 127.0 ml LV vol d, MOD A4C: 122.0 ml LV vol s, MOD A2C: 76.4 ml LV vol s, MOD A4C: 72.6 ml LV SV MOD A2C:     50.6 ml LV SV MOD A4C:     122.0 ml LV SV MOD BP:      47.4 ml RIGHT VENTRICLE RV S prime:     14.80 cm/s TAPSE (M-mode): 2.4 cm LEFT ATRIUM             Index       RIGHT ATRIUM           Index LA diam:        2.90 cm 1.51 cm/m  RA Area:     12.80 cm LA Vol (A2C):   31.7 ml 16.51 ml/m RA Volume:   33.00 ml  17.19 ml/m LA Vol (A4C):   27.5 ml 14.32 ml/m LA Biplane Vol: 31.0 ml 16.15 ml/m  AORTIC VALVE LVOT Vmax:   109.07 cm/s LVOT Vmean:  72.867 cm/s LVOT VTI:    0.179 m  AORTA Ao Root diam: 3.20 cm Ao Asc diam:  3.20 cm MITRAL VALVE               TRICUSPID VALVE MV Area (PHT): 2.95 cm    TR Peak grad:   8.3 mmHg MV Decel Time: 257 msec    TR Vmax:        144.00 cm/s MV E velocity: 82.90 cm/s MV A velocity: 74.20 cm/s  SHUNTS MV E/A ratio:  1.12        Systemic VTI:  0.18 m                            Systemic Diam:  2.20 cm Candee Furbish MD Electronically signed by Candee Furbish MD Signature Date/Time: 12/11/2020/11:31:51 AM    Final    US Abdomen Limited RUQ (LIVER/GB)  Result Date: 12/11/2020 CLINICAL DATA:  Cholecystitis. EXAM: ULTRASOUND ABDOMEN LIMITED RIGHT UPPER QUADRANT COMPARISON:  None. FINDINGS: Gallbladder: No gallstones are seen. The gallbladder wall is thickened (8.05 mm) and edematous. No sonographic Murphy sign noted by sonographer. Common bile duct: Diameter: 4.49 mm Liver: No focal lesion identified. Within normal limits in parenchymal echogenicity. Portal vein is patent on color Doppler imaging with normal direction of blood flow towards the liver. Other: None. IMPRESSION: Edematous gallbladder wall without evidence of cholelithiasis or acute cholecystitis. Further evaluation with a nuclear medicine hepatobiliary scan is recommended if this remains of clinical concern. Electronically Signed   By: Virgina Norfolk M.D.   On: 12/11/2020 00:30     Assessment and Plan:   Jaymar Loeber. is a 60 y.o. male with a hx of NICM (20-25%, recovered to 55%), cocaine abuse, HTN, DM2, mild to moderate MR who is being seen 12/11/2020 for the evaluation of Vfib arrest at the request of Dr. Carlis Abbott.  Cardiac arrest: presumed VFib/VT arrest given shockable rhythm PTA. Total of 7 defibs, 450 amio, 4 epis with EMS. Total downtime around 15-38mns with ROSC prior to arriving to the ED. Stable rhythm since admission. Cath 2018 with 50% LAD lesion. hsTn 2930. Will need ischemic evaluation once neurological improved. May be ready for cath tomorrow? Discussed with daughter at the bedside. Will tentatively place on  the cath schedule in the event his neurological status has improved. If non-obstructive CAD, then EP consult for ICD -- will keep NPO @ midnight  Hx of HFrEF: down to 20-25% in 2018 with recovery to normal several months later. Echo this admission with EF of 40-45% global hypokinesis.  -- currently weaning from  non-epi -- needs GDMT with BB/ARB/spiro when BP stable -- SGLT2  Shock: initially requiring norepi, now weaned   Cocaine abuse: UDS only + for benzos on admit (given in the ED)  DM: Hgb A1c 10.7 -- SSI  -- SGLT2 prior to discharge  Elevated transaminases: in the setting of cardiac arrest -- follow CMET  Abnormal bladder: renal lesion of 1.6cm  For questions or updates, please contact [place] Please consult [email or web address] for contact info under    Signed, Reino Bellis, NP  12/11/2020 2:47 PM   Patient seen and examined.  Agree with above documentation.  Mr. Lovejoy is a 60 year old male with a history of nonischemic cardiomyopathy with EF as low as 20 to 25% with subsequent recovery to 55%, T2DM, hypertension, cocaine use who we are consulted by Dr. Carlis Abbott for evaluation of V. fib arrest.  He was initially diagnosed with systolic heart failure in 2018.  Had presented with shortness of breath and found to have EF 20 to 25%.  Cath at that time showed a 50% LAD lesion but no obstructive CAD.  Subsequent echo in July 2018 showed LV function had normalized.  He follows with Dr. Stanford Breed, last seen in June 2022.  On 8/31 his daughter reports that he was sitting on the couch watching TV and appeared to fall asleep.  She heard a groaning noise and realized he was unresponsive.  She called 911.  Reports EMS arrived within a few minutes and immediately started ACLS.  Initial rhythm was shockable.  He received a total of 7 shocks, 4 mg of epinephrine, and 450 mg of amiodarone before ROSC was obtained.    In the ED, initial vital signs notable for BP 115/94, pulse 110.  Initial labs notable for creatinine 1.44, potassium 4.4, bicarb 17, AST 579, ALT 684, lactate 7.9, WBC 6.6, hemoglobin 13.4, platelets 212, ABG 7.29/40/171/20.  Labs today showed lactate improved to 2.2.  Creatinine improved to 1.2.  Echocardiogram showed EF 40 to 45%, global hypokinesis, normal RV function, no significant valvular  disease.  He was extubated today.  On exam, patient is alert and oriented to person and place, regular rate and rhythm, no murmurs, lungs CTAB, no LE edema or JVD.  He does not remember what happened yesterday.  For his VT/VF arrest (presumed given initial rhythm was shockable), cardiac catheterization is recommended to rule out obstructive CAD as etiology of his arrest.  We will tentatively plan for tomorrow, pending continued improvement in patient's mental status.  If cath is unremarkable, will need EP consult for ICD.  Donato Heinz, MD

## 2020-12-11 NOTE — ED Notes (Signed)
US at bedside

## 2020-12-11 NOTE — Progress Notes (Signed)
eLink Physician-Brief Progress Note Patient Name: James [name]. DOB: October 28, 1960 MRN: [id number]   Date of Service  12/11/2020  HPI/Events of Note  Troponin 465 (patient is s/p CPR).  eICU Interventions  Heparin gtt to continue pending trended Troponin x 2.        Thomasene Lot Burdette Gergely 12/11/2020, 3:23 AM

## 2020-12-11 NOTE — Progress Notes (Addendum)
ANTICOAGULATION CONSULT NOTE  Pharmacy Consult for heparin Indication:  r/o ACS (s/p VT/VF arrest)  No Known Allergies  Patient Measurements: Height: 6' (182.9 cm) Weight: 71 kg (156 lb 8.4 oz) IBW/kg (Calculated) : 77.6  Vital Signs: Temp: 97.7 F (36.5 C) (09/01 1600) Temp Source: Bladder (09/01 1600) BP: 148/77 (09/01 1900) Pulse Rate: 98 (09/01 1900)  Labs: Recent Labs    12/10/20 2145 12/10/20 2154 12/10/20 2313 12/11/20 0029 12/11/20 0151 12/11/20 0458 12/11/20 0714 12/11/20 0940 12/11/20 1142 12/11/20 1700  HGB 13.4 14.6 13.9  --  14.2 13.6  --   --   --   --   HCT 44.3 43.0 41.0  --  44.2 40.0  --   --   --   --   PLT 212  --   --   --  187  --   --   --   --   --   HEPARINUNFRC  --   --   --   --   --   --  0.10*  --   --  0.34  CREATININE 1.44* 1.30*  --   --  1.20  --   --   --   --   --   TROPONINIHS 74*  --   --  465*  --   --   --  2,930* 2,837*  --      Estimated Creatinine Clearance: 64.1 mL/min (by C-G formula based on SCr of 1.2 mg/dL).   Medical History: Past Medical History:  Diagnosis Date   Cellulitis and abscess of foot 09/29/2016   Chronic systolic heart failure (HCC)    Cocaine abuse (HCC)    Cocaine dependence with cocaine-induced mood disorder (HCC)    Depression    Diabetes mellitus without complication (HCC)    Essential hypertension    NICM (nonischemic cardiomyopathy) (HCC) EF 20-25%    Suicidal ideations 07/12/2017    Assessment: 60yo male w/ h/o cocaine abuse presents to ED after VT/VF arrest with concern for ACS. Pharmacy to begin heparin dosing. No reports of bleeding per nursing. H/H/plts stable this morning.  Heparin level came back therapeutic at 0.34, on 1150 units/hr. Hgb 13.6, plt 187. No s/sx of bleeding or infusion issues.   Goal of Therapy:  Heparin level 0.3-0.7 units/ml Monitor platelets by anticoagulation protocol: Yes   Plan:  Continue heparin infusion at 1150 units/hr  Monitor daily heparin level and  CBC.  Thank you for allowing pharmacy to participate in this patient's care.  Sherron Monday, PharmD, BCCCP Clinical Pharmacist  Phone: [phone] 12/11/2020 7:20 PM  Please check AMION for all [facility] Pharmacy phone numbers After 10:00 PM, call Main Pharmacy [phone]

## 2020-12-11 NOTE — Progress Notes (Signed)
eLink Physician-Brief Progress Note Patient Name: James [name]. DOB: Dec 28, 1960 MRN: [id number]   Date of Service  12/11/2020  HPI/Events of Note  Patient with a known history of ischemic cardiomyopathy with out of hospital VT / VFIB cardiac arrest s/p ROSC, he is intubated, sedated and mechanically ventilated, TTM was deferred secondary to post-arrest purposeful activity, he has CXR evidence of aspiration pneumonitis vs pneumonia.  eICU Interventions  New Patient Evaluation.        [name] U Nil Bolser 12/11/2020, 1:30 AM

## 2020-12-11 NOTE — Progress Notes (Signed)
RT called to room to extubate pt per MD order. Upon arrival to pt room, pt extubated by Dr. Denese Killings. Pt on 3L nasal cannula with humidity. Bilateral breath sounds with scattered rhonchi. No stridor heard at this time by RT. Pt able to speak and give a good cough. RT will continue to monitor.

## 2020-12-12 ENCOUNTER — Encounter (HOSPITAL_COMMUNITY)
Admission: EM | Disposition: A | Discharge status: Home / Self Care | Payer: Self-pay | Source: Home / Self Care | Attending: Internal Medicine

## 2020-12-12 ENCOUNTER — Ambulatory Visit (HOSPITAL_COMMUNITY): Payer: Self-pay

## 2020-12-12 ENCOUNTER — Inpatient Hospital Stay (HOSPITAL_COMMUNITY): Payer: Self-pay

## 2020-12-12 ENCOUNTER — Encounter (HOSPITAL_COMMUNITY): Payer: Self-pay | Admitting: Cardiovascular Disease

## 2020-12-12 DIAGNOSIS — I429 Cardiomyopathy, unspecified: Secondary | ICD-10-CM

## 2020-12-12 DIAGNOSIS — I4901 Ventricular fibrillation: Principal | ICD-10-CM

## 2020-12-12 DIAGNOSIS — R778 Other specified abnormalities of plasma proteins: Secondary | ICD-10-CM

## 2020-12-12 DIAGNOSIS — I2510 Atherosclerotic heart disease of native coronary artery without angina pectoris: Secondary | ICD-10-CM

## 2020-12-12 HISTORY — PX: LEFT HEART CATH AND CORONARY ANGIOGRAPHY: CATH118249

## 2020-12-12 LAB — LIPID PANEL
Cholesterol: 112 mg/dL (ref 0–200)
HDL: 75 mg/dL (ref >40)
LDL Cholesterol: 30 mg/dL (ref 0–99)
Total CHOL/HDL Ratio: 1.5 RATIO
Triglycerides: 33 mg/dL (ref <150)
VLDL: 7 mg/dL (ref 0–40)

## 2020-12-12 LAB — GLUCOSE, CAPILLARY
Glucose-Capillary: 181 mg/dL — ABNORMAL HIGH (ref 70–99)
Glucose-Capillary: 203 mg/dL — ABNORMAL HIGH (ref 70–99)
Glucose-Capillary: 252 mg/dL — ABNORMAL HIGH (ref 70–99)
Glucose-Capillary: 63 mg/dL — ABNORMAL LOW (ref 70–99)
Glucose-Capillary: 73 mg/dL (ref 70–99)
Glucose-Capillary: 94 mg/dL (ref 70–99)
Glucose-Capillary: 95 mg/dL (ref 70–99)

## 2020-12-12 LAB — CBC
HCT: 40.2 % (ref 39.0–52.0)
Hemoglobin: 12.9 g/dL — ABNORMAL LOW (ref 13.0–17.0)
MCH: 27.7 pg (ref 26.0–34.0)
MCHC: 32.1 g/dL (ref 30.0–36.0)
MCV: 86.3 fL (ref 80.0–100.0)
Platelets: 169 10*3/uL (ref 150–400)
RBC: 4.66 MIL/uL (ref 4.22–5.81)
RDW: 15.5 % (ref 11.5–15.5)
WBC: 8.7 10*3/uL (ref 4.0–10.5)
nRBC: 0 % (ref 0.0–0.2)

## 2020-12-12 LAB — HEPARIN LEVEL (UNFRACTIONATED): Heparin Unfractionated: 0.37 IU/mL (ref 0.30–0.70)

## 2020-12-12 LAB — MAGNESIUM: Magnesium: 2.1 mg/dL (ref 1.7–2.4)

## 2020-12-12 LAB — COMPREHENSIVE METABOLIC PANEL
ALT: 445 U/L — ABNORMAL HIGH (ref 0–44)
AST: 444 U/L — ABNORMAL HIGH (ref 15–41)
Albumin: 3.4 g/dL — ABNORMAL LOW (ref 3.5–5.0)
Alkaline Phosphatase: 79 U/L (ref 38–126)
Anion gap: 8 (ref 5–15)
BUN: 18 mg/dL (ref 8–23)
CO2: 22 mmol/L (ref 22–32)
Calcium: 8.4 mg/dL — ABNORMAL LOW (ref 8.9–10.3)
Chloride: 107 mmol/L (ref 98–111)
Creatinine, Ser: 1.12 mg/dL (ref 0.61–1.24)
GFR, Estimated: >60 mL/min (ref >60)
Glucose, Bld: 114 mg/dL — ABNORMAL HIGH (ref 70–99)
Potassium: 3.9 mmol/L (ref 3.5–5.1)
Sodium: 137 mmol/L (ref 135–145)
Total Bilirubin: 0.8 mg/dL (ref 0.3–1.2)
Total Protein: 5.9 g/dL — ABNORMAL LOW (ref 6.5–8.1)

## 2020-12-12 LAB — CALCIUM, IONIZED: Calcium, Ionized, Serum: 4.4 mg/dL — ABNORMAL LOW (ref 4.5–5.6)

## 2020-12-12 SURGERY — LEFT HEART CATH AND CORONARY ANGIOGRAPHY
Anesthesia: LOCAL

## 2020-12-12 MED ORDER — ASPIRIN 81 MG PO CHEW
81.0000 mg | CHEWABLE_TABLET | ORAL | Status: AC
  Administered 2020-12-12: 81 mg via ORAL
  Filled 2020-12-12: qty 1

## 2020-12-12 MED ORDER — ATORVASTATIN CALCIUM 80 MG PO TABS
80.0000 mg | ORAL_TABLET | Freq: Every day | ORAL | Status: DC
  Administered 2020-12-12 – 2020-12-17 (×6): 80 mg via ORAL
  Filled 2020-12-12 (×6): qty 1

## 2020-12-12 MED ORDER — HEPARIN SODIUM (PORCINE) 1000 UNIT/ML IJ SOLN
INTRAMUSCULAR | Status: AC
  Filled 2020-12-12: qty 1

## 2020-12-12 MED ORDER — SODIUM CHLORIDE 0.9 % IV SOLN: INTRAVENOUS | Status: DC

## 2020-12-12 MED ORDER — SODIUM CHLORIDE 0.9 % IV SOLN: 250.0000 mL | INTRAVENOUS | Status: DC | PRN

## 2020-12-12 MED ORDER — MIDAZOLAM HCL 2 MG/2ML IJ SOLN
INTRAMUSCULAR | Status: AC
  Filled 2020-12-12: qty 2

## 2020-12-12 MED ORDER — SODIUM CHLORIDE 0.9% FLUSH: 3.0000 mL | INTRAVENOUS | Status: DC | PRN

## 2020-12-12 MED ORDER — CARVEDILOL 3.125 MG PO TABS
3.1250 mg | ORAL_TABLET | Freq: Two times a day (BID) | ORAL | Status: DC
  Administered 2020-12-12 – 2020-12-13 (×2): 3.125 mg via ORAL
  Filled 2020-12-12 (×2): qty 1

## 2020-12-12 MED ORDER — HEPARIN (PORCINE) IN NACL 1000-0.9 UT/500ML-% IV SOLN
INTRAVENOUS | Status: DC | PRN
  Administered 2020-12-12: 500 mL

## 2020-12-12 MED ORDER — ENOXAPARIN SODIUM 40 MG/0.4ML IJ SOSY
40.0000 mg | PREFILLED_SYRINGE | INTRAMUSCULAR | Status: DC
  Filled 2020-12-12: qty 0.4

## 2020-12-12 MED ORDER — SODIUM CHLORIDE 0.9% FLUSH
3.0000 mL | Freq: Two times a day (BID) | INTRAVENOUS | Status: DC
  Administered 2020-12-12: 3 mL via INTRAVENOUS

## 2020-12-12 MED ORDER — ASPIRIN 81 MG PO CHEW
81.0000 mg | CHEWABLE_TABLET | Freq: Every day | ORAL | Status: DC
  Administered 2020-12-12 – 2020-12-17 (×6): 81 mg via ORAL
  Filled 2020-12-12 (×6): qty 1

## 2020-12-12 MED ORDER — HYDRALAZINE HCL 20 MG/ML IJ SOLN: 10.0000 mg | INTRAMUSCULAR | Status: AC | PRN

## 2020-12-12 MED ORDER — FENTANYL CITRATE (PF) 100 MCG/2ML IJ SOLN
INTRAMUSCULAR | Status: DC | PRN
  Administered 2020-12-12: 25 ug via INTRAVENOUS

## 2020-12-12 MED ORDER — ACETAMINOPHEN 325 MG PO TABS
650.0000 mg | ORAL_TABLET | ORAL | Status: DC | PRN
  Administered 2020-12-12 – 2020-12-16 (×2): 650 mg via ORAL
  Filled 2020-12-12 (×2): qty 2

## 2020-12-12 MED ORDER — LIDOCAINE HCL (PF) 1 % IJ SOLN
INTRAMUSCULAR | Status: DC | PRN
  Administered 2020-12-12: 3 mL

## 2020-12-12 MED ORDER — CEFTRIAXONE SODIUM 1 G IJ SOLR
1.0000 g | INTRAMUSCULAR | Status: AC
  Administered 2020-12-12 – 2020-12-15 (×4): 1 g via INTRAVENOUS
  Filled 2020-12-12 (×4): qty 10

## 2020-12-12 MED ORDER — HEPARIN (PORCINE) IN NACL 1000-0.9 UT/500ML-% IV SOLN
INTRAVENOUS | Status: AC
  Filled 2020-12-12: qty 500

## 2020-12-12 MED ORDER — SODIUM CHLORIDE 0.9 % IV SOLN: INTRAVENOUS | Status: AC

## 2020-12-12 MED ORDER — LIDOCAINE HCL (PF) 1 % IJ SOLN
INTRAMUSCULAR | Status: AC
  Filled 2020-12-12: qty 30

## 2020-12-12 MED ORDER — VERAPAMIL HCL 2.5 MG/ML IV SOLN
INTRAVENOUS | Status: DC | PRN
  Administered 2020-12-12: 10 mL via INTRA_ARTERIAL

## 2020-12-12 MED ORDER — HEPARIN (PORCINE) IN NACL 1000-0.9 UT/500ML-% IV SOLN
INTRAVENOUS | Status: DC | PRN
Start: 2020-12-12 — End: 2020-12-12
  Administered 2020-12-12: 500 mL

## 2020-12-12 MED ORDER — LABETALOL HCL 5 MG/ML IV SOLN: 10.0000 mg | INTRAVENOUS | Status: AC | PRN

## 2020-12-12 MED ORDER — VERAPAMIL HCL 2.5 MG/ML IV SOLN
INTRAVENOUS | Status: AC
  Filled 2020-12-12: qty 2

## 2020-12-12 MED ORDER — SODIUM CHLORIDE 0.9% FLUSH
3.0000 mL | Freq: Two times a day (BID) | INTRAVENOUS | Status: DC
  Administered 2020-12-12 – 2020-12-17 (×10): 3 mL via INTRAVENOUS

## 2020-12-12 MED ORDER — OXYCODONE-ACETAMINOPHEN 5-325 MG PO TABS
1.0000 | ORAL_TABLET | ORAL | Status: DC | PRN
  Administered 2020-12-12 – 2020-12-13 (×2): 1 via ORAL
  Filled 2020-12-12 (×2): qty 1

## 2020-12-12 MED ORDER — HEPARIN SODIUM (PORCINE) 1000 UNIT/ML IJ SOLN
INTRAMUSCULAR | Status: DC | PRN
  Administered 2020-12-12: 3500 [IU] via INTRAVENOUS

## 2020-12-12 MED ORDER — MIDAZOLAM HCL 2 MG/2ML IJ SOLN
INTRAMUSCULAR | Status: DC | PRN
  Administered 2020-12-12: 1 mg via INTRAVENOUS

## 2020-12-12 MED ORDER — FENTANYL CITRATE (PF) 100 MCG/2ML IJ SOLN
INTRAMUSCULAR | Status: AC
  Filled 2020-12-12: qty 2

## 2020-12-12 MED ORDER — IOHEXOL 350 MG/ML SOLN
INTRAVENOUS | Status: DC | PRN
  Administered 2020-12-12: 50 mL

## 2020-12-12 MED ORDER — ONDANSETRON HCL 4 MG/2ML IJ SOLN: 4.0000 mg | Freq: Four times a day (QID) | INTRAMUSCULAR | Status: DC | PRN

## 2020-12-12 SURGICAL SUPPLY — 10 items
CATH OPTITORQUE TIG 4.0 5F (CATHETERS) ×2 IMPLANT
DEVICE RAD COMP TR BAND LRG (VASCULAR PRODUCTS) ×1 IMPLANT
GLIDESHEATH SLEND SS 6F .021 (SHEATH) ×1 IMPLANT
GUIDEWIRE INQWIRE 1.5J.035X260 (WIRE) IMPLANT
INQWIRE 1.5J .035X260CM (WIRE) ×2
KIT HEART LEFT (KITS) ×2 IMPLANT
PACK CARDIAC CATHETERIZATION (CUSTOM PROCEDURE TRAY) ×2 IMPLANT
SHEATH PROBE COVER 6X72 (BAG) ×1 IMPLANT
TRANSDUCER W/STOPCOCK (MISCELLANEOUS) ×2 IMPLANT
TUBING CIL FLEX 10 FLL-RA (TUBING) ×2 IMPLANT

## 2020-12-12 NOTE — Evaluation (Signed)
Occupational Therapy Evaluation Patient Details Name: James [name]. MRN: [id number] DOB: [birth year] Today's Date: 12/12/2020    History of Present Illness 60 yo male presenting 8/31 after cardiac arrest sustained at home. Pt recieved 4 rounds of epinephrine, multiple rounds of chest compressions, and x7 rounds of defibrillation prior to ROSC upon arrival to ED. 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   Clinical Impression   PTA, pt was living with his wife and daughter and was independent and working part time. Pt currently requiring Min A for UB ADLs, Mod A for LB ADLs, and Min Guard-Min A for functional mobility with RW. Pt presenting with poor balance, safety, and cognition. During session, pt presenting with decreased attention, sequencing, problem solving, awareness, ST memory, and executive functioning while all impacting his safe performance of ADLs. Pt will require further acute OT to facilitate safe dc. Recommend dc to CIR for intensive OT to optimize safety, independence with ADLs/IADLs, and return to PLOF as well as decrease caregiver burden.     Follow Up Recommendations  CIR;Supervision/Assistance - 24 hour    Equipment Recommendations  3 in 1 bedside commode    Recommendations for Other Services PT consult;Rehab consult;Speech consult     Precautions / Restrictions Precautions Precautions: Fall Restrictions Weight Bearing Restrictions: No      Mobility Bed Mobility Overal bed mobility: Needs Assistance Bed Mobility: Rolling;Sidelying to Sit Rolling: Min assist Sidelying to sit: Min assist       General bed mobility comments: minA to complete with use of pillow, educated on log roll to reduce pain from rib fx    Transfers Overall transfer level: Needs assistance Equipment used: None Transfers: Sit to/from Stand Sit to Stand: Min guard         General transfer comment: minG for safety and  line management. pt not needing physical assist to power up or steady    Balance Overall balance assessment: Needs assistance Sitting-balance support: No upper extremity supported;Feet supported Sitting balance-Leahy Scale: Good     Standing balance support: Bilateral upper extremity supported Standing balance-Leahy Scale: Poor Standing balance comment: reliant on BUE support for gait                           ADL either performed or assessed with clinical judgement   ADL Overall ADL's : Needs assistance/impaired Eating/Feeding: Set up;Sitting   Grooming: Set up;Sitting   Upper Body Bathing: Minimal assistance;Sitting   Lower Body Bathing: Moderate assistance;Sit to/from stand   Upper Body Dressing : Minimal assistance;Sitting   Lower Body Dressing: Moderate assistance;Sit to/from stand   Toilet Transfer: Minimal assistance;+2 for safety/equipment;Ambulation;RW (simulated to recliner)           Functional mobility during ADLs: Minimal assistance;+2 for physical assistance General ADL Comments: Pt presenting with poor cognition, balance, and safety. Pt perseverating, poor memory, decreased attention, poor sequencing, and noted bumping into objects on L     Vision Baseline Vision/History: 1 Wears glasses (reading) Patient Visual Report: Other (comment) (Noting pt bumping into obejcts on L) Vision Assessment?: Vision impaired- to be further tested in functional context     Perception     Praxis      Pertinent Vitals/Pain Pain Assessment: 0-10 Pain Score: 8  Pain Location: chest Pain Descriptors / Indicators: Sore Pain Intervention(s): Monitored during session;Limited activity within patient's tolerance;Repositioned     Hand Dominance Right  Extremity/Trunk Assessment Upper Extremity Assessment Upper Extremity Assessment: Overall WFL for tasks assessed   Lower Extremity Assessment Lower Extremity Assessment: Defer to PT evaluation   Cervical /  Trunk Assessment Cervical / Trunk Assessment: Kyphotic   Communication Communication Communication: No difficulties   Cognition Arousal/Alertness: Awake/alert Behavior During Therapy: WFL for tasks assessed/performed Overall Cognitive Status: Impaired/Different from baseline Area of Impairment: Memory;Following commands;Safety/judgement;Awareness;Problem solving;Attention                   Current Attention Level: Selective Memory: Decreased short-term memory Following Commands: Follows one step commands consistently Safety/Judgement: Decreased awareness of safety;Decreased awareness of deficits Awareness: Intellectual Problem Solving: Decreased initiation;Difficulty sequencing;Requires verbal cues General Comments: pt with noted STM deficts through session, perseverating on a few topics and repeating information to therapists with limited awareness. needing cues for safety, wayfinding, and use of DME. Pt also bumping consistenyl into objects on L   General Comments  VSS on RA    Exercises     Shoulder Instructions      Home Living Family/patient expects to be discharged to:: Private residence Living Arrangements: Spouse/significant other;Children (wife and daughter) Available Help at Discharge: Family;Available 24 hours/day (wife is retired) Type of Home: House Home Access: Other (comment) ("I have a little hill")     Home Layout: One level     Bathroom Shower/Tub: Producer, television/film/video: Handicapped height     Home Equipment: Grab bars - tub/shower          Prior Functioning/Environment Level of Independence: Independent        Comments: Pt reports he is working part time        OT Problem List: Decreased strength;Decreased activity tolerance;Decreased range of motion;Impaired balance (sitting and/or standing);Decreased safety awareness;Decreased knowledge of use of DME or AE;Decreased knowledge of precautions;Decreased cognition      OT  Treatment/Interventions: Self-care/ADL training;Therapeutic exercise;Energy conservation;DME and/or AE instruction;Therapeutic activities;Patient/family education    OT Goals(Current goals can be found in the care plan section) Acute Rehab OT Goals Patient Stated Goal: return home and to independence OT Goal Formulation: With patient Time For Goal Achievement: 12/26/20 Potential to Achieve Goals: Good  OT Frequency: Min 2X/week   Barriers to D/C:            Co-evaluation PT/OT/SLP Co-Evaluation/Treatment: Yes Reason for Co-Treatment: For patient/therapist safety;To address functional/ADL transfers PT goals addressed during session: Mobility/safety with mobility;Balance;Strengthening/ROM OT goals addressed during session: ADL's and self-care      AM-PAC OT "6 Clicks" Daily Activity     Outcome Measure Help from another person eating meals?: A Little Help from another person taking care of personal grooming?: A Little Help from another person toileting, which includes using toliet, bedpan, or urinal?: A Lot Help from another person bathing (including washing, rinsing, drying)?: A Lot Help from another person to put on and taking off regular upper body clothing?: A Little Help from another person to put on and taking off regular lower body clothing?: A Lot 6 Click Score: 15   End of Session Equipment Utilized During Treatment: Gait belt;Rolling walker Nurse Communication: Mobility status  Activity Tolerance: Patient tolerated treatment well Patient left: in chair;with call bell/phone within reach;with chair alarm set  OT Visit Diagnosis: Unsteadiness on feet (R26.81);Other abnormalities of gait and mobility (R26.89);Muscle weakness (generalized) (M62.81)                Time: 9767-3419 OT Time Calculation (min): 34 min Charges:  OT General Charges $  OT Visit: 1 Visit OT Evaluation $OT Eval Moderate Complexity: 1 Mod  Dellis Voght MSOT, OTR/L Acute Rehab Pager:  [phone] Office: [phone]  Theodoro Grist Sigmund Morera 12/12/2020, 5:30 PM

## 2020-12-12 NOTE — Progress Notes (Signed)
SLP Cancellation Note  Patient Details Name: James [name]. MRN: [id number] DOB: 1960-10-22   Cancelled treatment:       Reason Eval/Treat Not Completed: Other (comment). Pt passed [employer] and RN documented. Will sign off.    James [name], Riley Nearing 12/12/2020, 8:39 AM

## 2020-12-12 NOTE — Evaluation (Signed)
Physical Therapy Evaluation Patient Details Name: James [name]. MRN: [id number] DOB: 11-20-1960 Today's Date: 12/12/2020   History of Present Illness  The pt is a 60 yo male presenting 8/31 after cardiac arrest sustained at home. Pt recieved 4 rounds of epinephrine, multiple rounds of chest compressions, and x7 rounds of defibrillation prior to ROSC upon arrival to ED. 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   Clinical Impression  Pt in bed upon arrival of PT, agreeable to evaluation at this time. Prior to admission the pt was completely independent with all mobility, reports recently retiring but also that he has gone back to part-time work. The pt now presents with limitations in functional mobility, and dynamic stability due to above dx, and will continue to benefit from skilled PT to address these deficits. The pt's mobility is most affected however, by cognitive deficits in [facility] and safety awareness that require max cues and assist to manage any OOB mobility safely at this time. Will continue to benefit from skilled PT acutely to further challenge dynamic stability, progress ambulation tolerance, and safety awareness with OOB mobility. Recommend CIR level therapies at d/c to facilitate return to independence with activity and reduce need for 24/7 supervision/assist from family.      Follow Up Recommendations CIR    Equipment Recommendations  Rolling walker with 5" wheels    Recommendations for Other Services       Precautions / Restrictions Precautions Precautions: Fall Restrictions Weight Bearing Restrictions: No      Mobility  Bed Mobility Overal bed mobility: Needs Assistance Bed Mobility: Rolling;Sidelying to Sit Rolling: Min assist Sidelying to sit: Min assist       General bed mobility comments: minA to complete with use of pillow, educated on log roll to reduce pain from rib fx     Transfers Overall transfer level: Needs assistance Equipment used: None Transfers: Sit to/from Stand Sit to Stand: Min guard         General transfer comment: minG for safety and line management. pt not needing physical assist to power up or steady  Ambulation/Gait Ambulation/Gait assistance: Min guard;Min assist Gait Distance (Feet): 200 Feet Assistive device: Rolling walker (2 wheeled) Gait Pattern/deviations: Step-through pattern;Decreased stride length;Drifts right/left;Trunk flexed Gait velocity: decreased Gait velocity interpretation: 1.31 - 2.62 ft/sec, indicative of limited community ambulator General Gait Details: pt needing minA to complete with frequent cues for safety, positioning in RW, posture, and directioning. pt with frequet driftin to L, running into muliple objects on his left despite cues. unable to recal room number to complete way-finding back to his rom     Balance Overall balance assessment: Needs assistance Sitting-balance support: No upper extremity supported;Feet supported Sitting balance-Leahy Scale: Good     Standing balance support: Bilateral upper extremity supported Standing balance-Leahy Scale: Poor Standing balance comment: reliant on BUE support for gait                             Pertinent Vitals/Pain Pain Assessment: 0-10 Pain Score: 8  Pain Location: chest Pain Descriptors / Indicators: Sore Pain Intervention(s): Limited activity within patient's tolerance;Monitored during session;Repositioned    Home Living Family/patient expects to be discharged to:: Private residence Living Arrangements: Spouse/significant other;Children (wife and daughter) Available Help at Discharge: Family;Available 24 hours/day (wife is retired) Type of Home: House Home Access:  ("I have a little hill")  Home Layout: One level Home Equipment: Grab bars - tub/shower      Prior Function Level of Independence: Independent          Comments: pt working full time     Hand Dominance   Dominant Hand: Right    Extremity/Trunk Assessment   Upper Extremity Assessment Upper Extremity Assessment: Defer to OT evaluation    Lower Extremity Assessment Lower Extremity Assessment: Overall WFL for tasks assessed    Cervical / Trunk Assessment Cervical / Trunk Assessment: Kyphotic  Communication   Communication: No difficulties  Cognition Arousal/Alertness: Awake/alert Behavior During Therapy: WFL for tasks assessed/performed Overall Cognitive Status: Impaired/Different from baseline Area of Impairment: Memory;Following commands;Safety/judgement;Awareness;Problem solving                     Memory: Decreased short-term memory Following Commands: Follows one step commands consistently Safety/Judgement: Decreased awareness of safety;Decreased awareness of deficits Awareness: Intellectual Problem Solving: Decreased initiation;Difficulty sequencing;Requires verbal cues General Comments: pt with noted STM deficts through session, perseverating on a few topics and repeating information to therapists with limited awareness. needing cues for safety, wayfinding, and use of DME      General Comments General comments (skin integrity, edema, etc.): VSS on RA    Exercises     Assessment/Plan    PT Assessment Patient needs continued PT services  PT Problem List Decreased strength;Decreased activity tolerance;Decreased balance;Decreased mobility;Decreased coordination;Decreased cognition;Decreased knowledge of use of DME;Decreased safety awareness       PT Treatment Interventions DME instruction;Gait training;Stair training;Functional mobility training;Therapeutic activities;Therapeutic exercise;Balance training;Cognitive remediation;Patient/family education    PT Goals (Current goals can be found in the Care Plan section)  Acute Rehab PT Goals Patient Stated Goal: return home and to independence PT Goal  Formulation: With patient Time For Goal Achievement: 12/26/20 Potential to Achieve Goals: Good    Frequency Min 4X/week   Barriers to discharge        Co-evaluation PT/OT/SLP Co-Evaluation/Treatment: Yes Reason for Co-Treatment: Complexity of the patient's impairments (multi-system involvement);Necessary to address cognition/behavior during functional activity;For patient/therapist safety;To address functional/ADL transfers PT goals addressed during session: Mobility/safety with mobility;Balance;Strengthening/ROM         AM-PAC PT "6 Clicks" Mobility  Outcome Measure Help needed turning from your back to your side while in a flat bed without using bedrails?: A Little Help needed moving from lying on your back to sitting on the side of a flat bed without using bedrails?: A Little Help needed moving to and from a bed to a chair (including a wheelchair)?: A Little Help needed standing up from a chair using your arms (e.g., wheelchair or bedside chair)?: A Little Help needed to walk in hospital room?: A Lot Help needed climbing 3-5 steps with a railing? : A Lot 6 Click Score: 16    End of Session Equipment Utilized During Treatment: Gait belt Activity Tolerance: Patient tolerated treatment well Patient left: in chair;with call bell/phone within reach;with chair alarm set Nurse Communication: Mobility status PT Visit Diagnosis: Other abnormalities of gait and mobility (R26.89)    Time: 6546-5035 PT Time Calculation (min) (ACUTE ONLY): 31 min   Charges:   PT Evaluation $PT Eval Moderate Complexity: 1 Mod          Vickki Muff, PT, DPT   Acute Rehabilitation Department Pager #: [phone]  Ronnie Derby 12/12/2020, 5:00 PM

## 2020-12-12 NOTE — Progress Notes (Signed)
Inpatient Diabetes Program Recommendations  AACE/ADA: New Consensus Statement on Inpatient Glycemic Control (2015)  Target Ranges:  Prepandial:   less than 140 mg/dL      Peak postprandial:   less than 180 mg/dL (1-2 hours)      Critically ill patients:  140 - 180 mg/dL   Lab Results  Component Value Date   GLUCAP 95 12/12/2020   HGBA1C 10.7 (H) 12/11/2020    Review of Glycemic Control Results for James [name], James [name] (MRN [id number]) as of 12/12/2020 10:18  Ref. Range 12/11/2020 08:17 12/11/2020 11:38 12/11/2020 15:50 12/11/2020 19:28 12/11/2020 23:54 12/12/2020 03:39 12/12/2020 07:04 12/12/2020 07:23  Glucose-Capillary Latest Ref Range: 70 - 99 mg/dL 292 (H) 446 (H) 92 286 (H) 129 (H) 63 (L) 73 95   Diabetes history: DM 2 Outpatient Diabetes medications:  Farxiga 5 mg daily Lantus 14 units q HS Metformin 1000 mg bid Current orders for Inpatient glycemic control:  Novolog moderate q 4 hours  Inpatient Diabetes Program Recommendations:    Reduce Novolog correction to sensitive q 4 hours.   Thanks,  Beryl Meager, RN, BC-ADM Inpatient Diabetes Coordinator Pager [phone] (8a-5p)

## 2020-12-12 NOTE — Progress Notes (Signed)
ANTICOAGULATION CONSULT NOTE  Pharmacy Consult for heparin Indication:  r/o ACS (s/p VT/VF arrest)  No Known Allergies  Patient Measurements: Height: 6' (182.9 cm) Weight: 71 kg (156 lb 8.4 oz) IBW/kg (Calculated) : 77.6  Vital Signs: Temp: 98.9 F (37.2 C) (09/01 2358) Temp Source: Oral (09/01 2358) BP: 124/76 (09/02 0400) Pulse Rate: 96 (09/02 0400)  Labs: Recent Labs    12/10/20 2145 12/10/20 2154 12/10/20 2313 12/11/20 0029 12/11/20 0151 12/11/20 0458 12/11/20 0714 12/11/20 0940 12/11/20 1142 12/11/20 1700 12/12/20 0029  HGB 13.4 14.6   < >  --  14.2 13.6  --   --   --   --  12.9*  HCT 44.3 43.0   < >  --  44.2 40.0  --   --   --   --  40.2  PLT 212  --   --   --  187  --   --   --   --   --  169  HEPARINUNFRC  --   --   --   --   --   --  0.10*  --   --  0.34 0.37  CREATININE 1.44* 1.30*  --   --  1.20  --   --   --   --   --  1.12  TROPONINIHS 74*  --   --  465*  --   --   --  2,930* 2,837*  --   --    < > = values in this interval not displayed.     Estimated Creatinine Clearance: 68.7 mL/min (by C-G formula based on SCr of 1.12 mg/dL).   Medical History: Past Medical History:  Diagnosis Date   Cellulitis and abscess of foot 09/29/2016   Chronic systolic heart failure (HCC)    Cocaine abuse (HCC)    Cocaine dependence with cocaine-induced mood disorder (HCC)    Depression    Diabetes mellitus without complication (HCC)    Essential hypertension    NICM (nonischemic cardiomyopathy) (HCC) EF 20-25%    Suicidal ideations 07/12/2017    Assessment: 60yo male w/ h/o cocaine abuse presents to ED after VT/VF arrest with concern for ACS. Pharmacy to begin heparin dosing. No reports of bleeding per nursing. H/H/plts stable this morning.  Heparin level came back therapeutic at 0.37, on 1150 units/hr. Hgb 12.9, plt 169. No s/sx of bleeding or infusion issues.   Goal of Therapy:  Heparin level 0.3-0.7 units/ml Monitor platelets by anticoagulation protocol:  Yes   Plan:  Continue heparin infusion at 1150 units/hr  Monitor daily heparin level and CBC.  Thank you for allowing pharmacy to participate in this patient's care.  Enos Fling, PharmD PGY1 Pharmacy Resident 12/12/2020 5:21 AM Check [email or web address] for unit specific pharmacy number

## 2020-12-12 NOTE — Consult Note (Addendum)
Cardiology Consultation:   Patient ID: James [name]. MRN: [id number]; DOB: [birth year]  Admit date: 12/10/2020 Date of Consult: 12/12/2020  PCP:  Elsie Stain, MD   [facility] Providers Cardiologist:  Kirk Ruths, MD      Patient Profile:   James [name]. is a 60 y.o. male with a hx of NICM (2019 with recovered LVEF), HTN, DM, hx of cocaine abuse who is being seen 12/12/2020 for the evaluation of cardiac arrest at the request of Dr. Ellyn Hack.  History of Present Illness:   James [name] last saw Dr. Stanford Breed June 2022, pt with reports of some SOB, prior echo ordered though not done yet. Planned to f/u on echo, adjust meds as indicated.  Pt denied any ongoing drug use, urged to quit smoking  He was admitted to [facility] in review of record was sitting on the couch watching TV with his daughter and dozed off. Shortly after began foaming at the mouth and groaning. Daughter called 28 and CPR was started by EMS who arrived to the scene quickly. On fire arrival AED advised a shock. Reports indicate being defib x7, $RemoveB'450mg'THIBBfgp$  amiodarone, 4 epis and achieved ROSC by time of arrival to the ED.  Required pressor support on arrival   Extubated yesterday, somewhat confused with repetitive questions,and planned for cath.  He had LHC today with stable LAD lesion, no obstructive CAD, CCM is following with aspiration pneumonitis, b/l rib fractures, abnormal GB, bladder, kidney on CT planned for out pt follow up.  GB US negative for acute chole.  EP is asked to the case to consider ICD  EMS record reviewed FD 1st to scene, found unresponsive AED applied and advised/received 2 shocks EMS subsequently Defibrillated him a total of 7 Meds: 5 rounds of Epi Amio 300 and 150 CPR by family > continued by FR and EMS Intubated in the field Tracings reviewed VF > SR    K+ 4.4 > 4.2 > >> 3.9 Mag 2.1 >>> 2.1 BUN/Creat 20/1.44 >>> 18/1.12 HS Trop 74 > 465 >  2930 > 2837 Lactic acid 7.9 > 2.2   WBC 6.6 >>>  8.7 H/H 13/44 >> 12/40 Plts 212 >> 169  Tox only + for benzos, got midazolam by EMS w/intubation  Just finished some lunch, very sore in his chest, denies SOB. No memory f events Knows he is in the hospital, not why, and asks how many days he has been here. Otherwise he is quite appropriate.  He had a syncopal event a year or so ago associated with his BS in the 20's, no syncope otherwise  He has been clean without ETOH or drugs almost 2 years Still smokes cigarettes   Past Medical History:  Diagnosis Date   Cellulitis and abscess of foot 70/78/6754   Chronic systolic heart failure (HCC)    Cocaine abuse (HCC)    Cocaine dependence with cocaine-induced mood disorder ([place])    Depression    Diabetes mellitus without complication ([place])    Essential hypertension    NICM (nonischemic cardiomyopathy) ([place]) EF 20-25%    Suicidal ideations 07/12/2017    Past Surgical History:  Procedure Laterality Date   CARDIAC CATHETERIZATION N/A 04/30/2016   Procedure: Right/Left Heart Cath and Coronary Angiography;  Surgeon: Nelva Bush, MD;  Location: [place] [facility];  Service: Cardiovascular;  Laterality: N/A;   FINGER SURGERY     LEFT HEART CATH AND CORONARY ANGIOGRAPHY N/A 12/12/2020   Procedure: LEFT HEART CATH AND CORONARY ANGIOGRAPHY;  Surgeon: Troy Sine, MD;  Location: [place] [facility];  Service: Cardiovascular;  Laterality: N/A;     Home Medications:  Prior to Admission medications   Medication Sig Start Date End Date Taking? Authorizing Provider  atorvastatin (LIPITOR) 40 MG tablet TAKE 1 TABLET BY MOUTH DAILY FOR HIGH CHOLESTEROL Patient taking differently: Take 40 mg by mouth daily. 07/29/20 07/29/21 Yes Elsie Stain, MD  dapagliflozin propanediol (FARXIGA) 5 MG TABS tablet Take 1 tablet (5 mg total) by mouth daily before breakfast. 11/10/20  Yes Elsie Stain, MD  insulin glargine (LANTUS SOLOSTAR) 100 UNIT/ML Solostar Pen Inject 14 Units into the  skin at bedtime. 08/13/20  Yes Elsie Stain, MD  lisinopril (ZESTRIL) 10 MG tablet TAKE 1 TABLET (10 MG TOTAL) BY MOUTH DAILY. Patient taking differently: Take 10 mg by mouth daily. 07/29/20 07/29/21 Yes Elsie Stain, MD  metFORMIN (GLUCOPHAGE) 1000 MG tablet TAKE 1 TABLET BY MOUTH TWICE DAILY WITH A MEAL FOR DIABETES [place] Patient taking differently: Take 1,000 mg by mouth 2 (two) times daily with a meal. 12/03/20 12/03/21 Yes Elsie Stain, MD  sildenafil (VIAGRA) 100 MG tablet TAKE 0.5-1 TABLETS (50-100 MG TOTAL) BY MOUTH DAILY AS NEEDED FOR ERECTILE DYSFUNCTION. Patient taking differently: Take 50-100 mg by mouth daily as needed for erectile dysfunction. 12/27/19 01/04/21 Yes Elsie Stain, MD  aspirin 81 MG chewable tablet Chew 81 mg by mouth daily. 1Daily    [provider]  blood glucose meter kit and supplies Dispense based on patient and insurance preference. Use up to four times daily as directed. (FOR ICD-9 250.00, 250.01). 07/17/19   Mayers, Cari S, PA-C  Blood Glucose Monitoring Suppl (TRUE METRIX METER) w/Device KIT Use to measure blood sugar twice a day 12/11/19   Elsie Stain, MD  cholecalciferol (VITAMIN D3) 25 MCG (1000 UNIT) tablet Take 1,000 Units by mouth daily.    [provider]  furosemide (LASIX) 20 MG tablet Take 1 tablet (20 mg total) by mouth daily as needed. 09/16/20 09/16/21  Lelon Perla, MD  glucose blood test strip USE TO CHECK BLOOD SUGARS 2 TIMES DAILY 07/29/20   Elsie Stain, MD  Insulin Syringe 27G X 1/2" 0.5 ML MISC Use with lantus 12/11/19   Elsie Stain, MD  triamcinolone cream (KENALOG) 0.1 % Apply 1 application topically 2 (two) times daily. Mix with skin moisturizer and apply to right small toe 07/29/20   Elsie Stain, MD  TRUEplus Lancets 28G MISC Use to measure blood sugar twice a day 07/29/20   Elsie Stain, MD    Inpatient Medications: Scheduled Meds:  aspirin  81 mg Oral Daily   atorvastatin  80  mg Oral Daily   carvedilol  3.125 mg Oral BID WC   Chlorhexidine Gluconate Cloth  6 each Topical Daily   [START ON 12/13/2020] enoxaparin (LOVENOX) injection  40 mg Subcutaneous Q24H   insulin aspart  0-15 Units Subcutaneous Q4H   mouth rinse  15 mL Mouth Rinse BID   pantoprazole  40 mg Oral Daily   polyethylene glycol  17 g Oral Daily   sodium chloride flush  3 mL Intravenous Q12H   Continuous Infusions:  sodium chloride 10 mL/hr at 12/12/20 1000   sodium chloride 100 mL/hr at 12/12/20 1221   sodium chloride     cefTRIAXone (ROCEPHIN)  IV     PRN Meds: sodium chloride, acetaminophen, acetaminophen, docusate, hydrALAZINE, labetalol, ondansetron (ZOFRAN) IV, polyethylene glycol, sodium chloride  flush, traMADol  Allergies:   No Known Allergies  Social History:   Social History   Socioeconomic History   Marital status: Married    Spouse name: Not on file   Number of children: Not on file   Years of education: Not on file   Highest education level: Not on file  Occupational History   Not on file  Tobacco Use   Smoking status: Every Day    Packs/day: 0.50    Years: 23.00    Pack years: 11.50    Types: Cigarettes   Smokeless tobacco: Never   Tobacco comments:    Every other day   Vaping Use   Vaping Use: Never used  Substance and Sexual Activity   Alcohol use: Yes   Drug use: Not Currently    Types: Cocaine, "Crack" cocaine   Sexual activity: Not Currently  Other Topics Concern   Not on file  Social History Narrative   Not on file   Social Determinants of Health   Financial Resource Strain: Not on file  Food Insecurity: Not on file  Transportation Needs: Not on file  Physical Activity: Not on file  Stress: Not on file  Social Connections: Not on file  Intimate Partner Violence: Not on file    Family History:   Family History  Problem Relation Age of Onset   Diabetes Other    Hypertension Other    Diabetes Mother    Brain cancer Father    Mental illness  Brother    Drug abuse Brother    Diabetes Brother      ROS:  Please see the history of present illness.  All other ROS reviewed and negative.     Physical Exam/Data:   Vitals:   12/12/20 0900 12/12/20 0954 12/12/20 1000 12/12/20 1100  BP: 130/73  137/70   Pulse: 100 94 93   Resp: (!) 24 15 (!) 22   Temp:    97.9 F (36.6 C)  TempSrc:    Oral  SpO2: 100% 100% 100%   Weight:      Height:        Intake/Output Summary (Last 24 hours) at 12/12/2020 1239 Last data filed at 12/12/2020 1200 Gross per 24 hour  Intake 818.25 ml  Output 1975 ml  Net -1156.75 ml   Last 3 Weights 12/12/2020 12/11/2020 12/11/2020  Weight (lbs) 152 lb 8.9 oz 156 lb 8.4 oz 162 lb 11.2 oz  Weight (kg) 69.2 kg 71 kg 73.8 kg  Some encounter information is confidential and restricted. Go to Review Flowsheets activity to see all data.     Body mass index is 20.69 kg/m.  General:  Well nourished, well developed, in no acute distress HEENT: normal Lymph: no adenopathy Neck: no JVD Endocrine:  No thryomegaly Vascular: No carotid bruits; FA pulses 2+ bilaterally without bruits  Cardiac:  RRR; no murmurs, gallops or rubs Lungs: slightly diminished at the bases, poor inspiratory effort 2/2 pain, no wheezing, rhonchi or rales  Abd: soft, nontender  Ext: no edema Musculoskeletal:  No deformities Skin: warm and dry  Neuro: no focal abnormalities noted Psych:  Normal affect   EKG:  The EKG was personally reviewed and demonstrates:    SR 91bpm, PVCs, no clear ischemic changes  OLD 06/19/20: ST 106bpm, nonspecific ST/T changes inf/lat  Telemetry:  Telemetry was personally reviewed and demonstrates:   SR occ PVCs only  Relevant CV Studies:   12/12/20: TTE   Mid LAD lesion is 50% stenosed.  Mild to moderate nonobstructive CAD with calcification of the mid LAD with previously noted 50% stenosis not significantly changed.  Normal intermediate, circumflex, and RCA.   LVEDP 11 mmHg.   RECOMMENDATION: Medical  therapy.    12/11/20: TTE IMPRESSIONS   1. Left ventricular ejection fraction, by estimation, is 40 to 45%. The  left ventricle has mildly decreased function. The left ventricle  demonstrates global hypokinesis. Left ventricular diastolic parameters  were normal.   2. Right ventricular systolic function is normal. The right ventricular  size is normal. There is normal pulmonary artery systolic pressure.   3. The mitral valve is normal in structure. Trivial mitral valve  regurgitation. No evidence of mitral stenosis.   4. The aortic valve is normal in structure. Aortic valve regurgitation is  not visualized. No aortic stenosis is present.   5. The inferior vena cava is normal in size with greater than 50%  respiratory variability, suggesting right atrial pressure of 3 mmHg.     10/14/2016: TTE Study Conclusions  - Left ventricle: The cavity size was normal. Wall thickness was    normal. Systolic function was normal. The estimated ejection    fraction was in the range of 55% to 60%. Wall motion was normal;    there were no regional wall motion abnormalities. Doppler    parameters are consistent with abnormal left ventricular    relaxation (grade 1 diastolic dysfunction).  - Aortic valve: There was no stenosis.  - Mitral valve: There was trivial regurgitation.  - Right ventricle: The cavity size was normal. Systolic function    was normal.  - Pulmonary arteries: No complete TR doppler jet so unable to    estimate PA systolic pressure.   Impressions:  - Normal LV size with EF 55-60%. Normal RV size and systolic    function. No significant valvular abnormalities.    04/30/2017: LHC Conclusions: Moderate mid LAD stenosis (50%) with otherwise minimal CAD, consistent with nonischemic cardiomyopathy. Normal right heart filling pressures. Upper normal to mildly elevated left heart filling pressures. Mild pulmonary hypertension. Low normal to mildly decreased Fick cardiac  output/index.   Recommendations: Continue medical therapy for acute systolic and diastolic heart failure. Medical management of nonobstructive CAD, including statin therapy to prevent progression of disease.     04/27/2016: TTE Study Conclusions  - Left ventricle: The cavity size was mildly dilated. Wall    thickness was normal. Systolic function was severely reduced. The    estimated ejection fraction was in the range of 20% to 25%.    Diffuse hypokinesis. There was fusion of early and atrial    contributions to ventricular filling. Doppler parameters are    consistent with a reversible restrictive pattern, indicative of    decreased left ventricular diastolic compliance and/or increased    left atrial pressure (grade 3 diastolic dysfunction).  - Mitral valve: There was mild to moderate regurgitation.  - Pulmonary arteries: Systolic pressure was moderately increased.    PA peak pressure: 48 mm Hg (S).  - Pericardium, extracardiac: A trivial pericardial effusion was    identified.   Laboratory Data:  High Sensitivity Troponin:   Recent Labs  Lab 12/10/20 2145 12/11/20 0029 12/11/20 0940 12/11/20 1142  TROPONINIHS 74* 465* 2,930* 2,837*     Chemistry Recent Labs  Lab 12/10/20 2145 12/10/20 2154 12/10/20 2313 12/11/20 0151 12/11/20 0458 12/12/20 0029  NA 139 139   < > 136 140 137  K 4.4 4.2   < > 5.0 3.9 3.9  CL 106 105  --  107  --  107  CO2 17*  --   --  18*  --  22  GLUCOSE 317* 298*  --  290*  --  114*  BUN 20 23  --  21  --  18  CREATININE 1.44* 1.30*  --  1.20  --  1.12  CALCIUM 8.3*  --   --  8.2*  --  8.4*  GFRNONAA 55*  --   --  >60  --  >60  ANIONGAP 16*  --   --  11  --  8   < > = values in this interval not displayed.    Recent Labs  Lab 12/10/20 2145 12/12/20 0029  PROT 5.6* 5.9*  ALBUMIN 3.2* 3.4*  AST 579* 444*  ALT 684* 445*  ALKPHOS 116 79  BILITOT 0.9 0.8   Hematology Recent Labs  Lab 12/10/20 2145 12/10/20 2154 12/11/20 0151  12/11/20 0458 12/12/20 0029  WBC 6.6  --  11.3*  --  8.7  RBC 4.78  --  5.03  --  4.66  HGB 13.4   < > 14.2 13.6 12.9*  HCT 44.3   < > 44.2 40.0 40.2  MCV 92.7  --  87.9  --  86.3  MCH 28.0  --  28.2  --  27.7  MCHC 30.2  --  32.1  --  32.1  RDW 15.2  --  15.2  --  15.5  PLT 212  --  187  --  169   < > = values in this interval not displayed.   BNPNo results for input(s): BNP, PROBNP in the last 168 hours.  DDimer No results for input(s): DDIMER in the last 168 hours.   Radiology/Studies:  CT HEAD WO CONTRAST (5MM) Result Date: 12/10/2020 CLINICAL DATA:  post arrest, unknown origin. Cardiac arrest by family, who started CPR, on fire arrival AED advised a shock. EMS monitoring showing pt in vfib, shocked total 7 times EXAM: CT HEAD WITHOUT CONTRAST CT CHEST, ABDOMEN AND PELVIS WITH CONTRAST TECHNIQUE: Contiguous axial images were obtained from the base of the skull through the vertex without intravenous contrast. Multidetector CT imaging of the chest, abdomen and pelvis was performed following the standard protocol during bolus administration of intravenous contrast. CONTRAST:  18mL OMNIPAQUE IOHEXOL 350 MG/ML SOLN COMPARISON:  None. FINDINGS: CT HEAD FINDINGS Brain: No evidence of large-territorial acute infarction. No parenchymal hemorrhage. No mass lesion. No extra-axial collection. No mass effect or midline shift. No hydrocephalus. Basilar cisterns are patent. Vascular: No hyperdense vessel. Skull: No acute fracture or focal lesion. Sinuses/Orbits: Paranasal sinuses and mastoid air cells are clear. The orbits are unremarkable. Other: Frothy secretions noted within the nasopharynx. CHEST: Ports and Devices: Endotracheal tube with tip terminating 6 cm above the carina. Enteric tube with tip and side port terminating within the gastric lumen. Lungs/airways: Bilateral lower lobe dependent consolidations. Bilateral lower lobe basilar patchy airspace opacities. No pulmonary nodule. No pulmonary  mass. No pulmonary contusion or laceration. No pneumatocele formation. The central airways are patent. Pleura: No pleural effusion. No pneumothorax. No hemothorax. Lymph Nodes: No mediastinal, hilar, or axillary lymphadenopathy. Mediastinum: No pneumomediastinum. No aortic injury or mediastinal hematoma. The thoracic aorta is normal in caliber. The heart is normal in size. No significant pericardial effusion. The main pulmonary artery is normal in caliber. No central pulmonary embolus. The esophagus is unremarkable. The thyroid is unremarkable. Chest Wall / Breasts: No chest wall mass. Musculoskeletal: Nondisplaced left  anterior second, third, fourth, fifth, sixth, seventh rib fractures. Minimally displaced left anterior fifth rib fracture. Nondisplaced right anterior third through fifth rib fractures. No acute displaced sternal fracture. No spinal fracture. ABDOMEN / PELVIS: Liver: Mild portal edema. Borderline enlarged. Subcentimeter hypodensity is too small to characterize. No laceration or subcapsular hematoma. Biliary System: Calcified gallstone with suggestion of gallbladder wall thickening and pericholecystic fluid. No biliary ductal dilatation. Pancreas: Normal pancreatic contour. No main pancreatic duct dilatation. Spleen: Not enlarged. No focal lesion. No laceration, subcapsular hematoma, or vascular injury. Adrenal Glands: No nodularity bilaterally. Kidneys: Bilateral kidneys enhance symmetrically. No hydronephrosis. No contusion, laceration, or subcapsular hematoma. No injury to the vascular structures or collecting systems. No hydroureter. There is a 1.6 cm right renal lesion with a density of 2.6 Hounsfield units. The urinary bladder is decompressed with Foley catheter and balloon terminating within its lumen. Marked circumferential urinary bladder wall thickening. No excretion of intravenous contrast noted within the kidney on the delayed view. Bowel: No small or large bowel wall thickening or  dilatation. Scattered colonic diverticulosis. The appendix is unremarkable. Mesentery, Omentum, and Peritoneum: No simple free fluid ascites. No pneumoperitoneum. No hemoperitoneum. No mesenteric hematoma identified. No organized fluid collection. Pelvic Organs: Normal. Lymph Nodes: No abdominal, pelvic, inguinal lymphadenopathy. Vasculature: Atherosclerotic plaque. No abdominal aorta or iliac aneurysm. No active contrast extravasation or pseudoaneurysm. Musculoskeletal: No significant soft tissue hematoma. No acute pelvic fracture. No spinal fracture. IMPRESSION: 1. No acute intracranial abnormality. 2. Bilateral lower lobe findings may represent infection/inflammation (such as aspiration pneumonia. 3. Cholelithiasis with findings suggestive of acute cholecystitis. Recommend further evaluation with right upper quadrant ultrasound. 4. Marked circumferential urinary bladder wall thickening which likely is not fully accounted for by under distension. Differential diagnosis includes malignancy or infection. Correlate with urinalysis. 5. Left 2nd-7th acute rib fractures with the fifth rib minimally displaced. Nondisplaced right 3rd-5th rib fractures. Findings consistent with given history of CPR. No associated pneumothorax. 6. No excretion of intravenous contrast noted within the kidney on the delayed view. Recommend correlation with labs. Other imaging findings of potential clinical significance: 1. Nonspecific portal edema. 2. Scattered colonic diverticulosis with no acute diverticulitis. 3. Indeterminate 1.6 cm right renal lesion. Recommend nonemergent MRI renal protocol for further evaluation. When the patient is clinically stable and able to follow directions and hold their breath (preferably as an outpatient) further evaluation with dedicated abdominal MRI should be considered. 4. Aortic Atherosclerosis (ICD10-I70.0). Electronically Signed   By: Iven Finn M.D.   On: 12/10/2020 23:25     DG Chest Port 1  View Result Date: 12/12/2020 CLINICAL DATA:  Shortness of breath, respiratory failure, cardiac arrest EXAM: PORTABLE CHEST 1 VIEW COMPARISON:  Chest radiograph 12/10/2020. FINDINGS: The patient has been extubated. The enteric catheter has been removed. A curvilinear catheter projecting over the lower thoracic spine may be external to the patient. The cardiomediastinal silhouette is stable. There is vascular congestion without overt pulmonary edema, similar to the prior study. Patchy opacities in the lung bases likely reflect subsegmental atelectasis. The upper lungs are well aerated. There is silhouetting of the right costophrenic angle which may reflect a new trace pleural effusion. There is no left pleural effusion. There is no pneumothorax. The bones are stable. IMPRESSION: 1. Trace right pleural effusion and bibasilar subsegmental atelectasis. 2. Vascular congestion without overt pulmonary edema, not significantly changed. Electronically Signed   By: Valetta Mole M.D.   On: 12/12/2020 08:49      US Abdomen Limited RUQ (LIVER/GB) Result  Date: 12/11/2020 CLINICAL DATA:  Cholecystitis. EXAM: ULTRASOUND ABDOMEN LIMITED RIGHT UPPER QUADRANT COMPARISON:  None. FINDINGS: Gallbladder: No gallstones are seen. The gallbladder wall is thickened (8.05 mm) and edematous. No sonographic Murphy sign noted by sonographer. Common bile duct: Diameter: 4.49 mm Liver: No focal lesion identified. Within normal limits in parenchymal echogenicity. Portal vein is patent on color Doppler imaging with normal direction of blood flow towards the liver. Other: None. IMPRESSION: Edematous gallbladder wall without evidence of cholelithiasis or acute cholecystitis. Further evaluation with a nuclear medicine hepatobiliary scan is recommended if this remains of clinical concern. Electronically Signed   By: Virgina Norfolk M.D.   On: 12/11/2020 00:30     Assessment and Plan:   Cardiac arrest AED with shockable rhythm > x2 shocks by  FR/AED VF by EMS tracings  Stable, non-obstructive CAD LVEF 40-45%  He Oleda Borski need an ICD Emmaleah Meroney d/w Dr. Curt Bears +/- c.MRI No AAD I don't think at this juncture  No driving 6 mo, pt and daughter made aware, though Cortlan Dolin need to be revisited/reminded with short term memory issues currently   Risk Assessment/Risk Scores:  {  For questions or updates, please contact [place] Please consult [email or web address] for contact info under    Signed, Baldwin Jamaica, PA-C  12/12/2020 12:39 PM  I have seen and examined this patient with Tommye Standard.  Agree with above, note added to reflect my findings.  On exam, RRR, no murmurs.   He is status post cardiac arrest.  He was watching TV with his daughter and was found to be foaming at the mouth and groaning.  He received multiple rounds of amiodarone and defibrillator shocks.  Left heart catheterization today showed stable coronary artery disease.  He Amen Dargis likely need an ICD prior to discharge.  We Maryan Sivak plan for cardiac MRI prior to ICD implant.  We Lovelle Deitrick adjust further therapy post MRI.  Mcdonald Reiling M. Cass Edinger MD 12/12/2020 2:06 PM

## 2020-12-12 NOTE — Progress Notes (Signed)
NAME:  James [name]., MRN:  [id number], DOB:  Dec 18, 1960, LOS: 1 ADMISSION DATE:  12/10/2020, CONSULTATION DATE:  12/10/20 REFERRING MD:  Kommor- ED, CHIEF COMPLAINT:  cardiac arrest   History of Present Illness:  James [name] is a 60 y/o gentleman with a history of NICM with recovered EF, HTN, DM who presented post-cardiac arrest. He was watching TV with his daughter and dozed off. He began foaming at the mouth and she called 911 and initiated resuscitation. EMS arrived in 3 minutes and he was in VT/VF. He received defibrillations amiodarone 450mg  total, and epinephrine x 4 doses. He was intubated in the field and had achieved ROSC upon arrival to the ED. He had not complained of new or unusual symptoms leading up to this. In the ED he required sedation due to fighting restraints and attempting to self-extubate. He is currently on a NE infusions. His daughter and brother are at bedside.   Pertinent  Medical History  DM HTN NICM> recovered EF Cocaine abuse  Significant Hospital Events: Including procedures, antibiotic start and stop dates in addition to other pertinent events   8/31 admitted s/p card arrest; intubated  9/1: extubated; weaned off levo 9/2: plan for heart cath today  Interim History / Subjective:    Objective   Blood pressure 138/76, pulse 96, temperature 98.9 F (37.2 C), temperature source Oral, resp. rate 17, height 6' (1.829 m), weight 71 kg, SpO2 100 %.    Vent Mode: PRVC FiO2 (%):  [30 %] 30 % Set Rate:  [18 bmp] 18 bmp Vt Set:  [530 mL] 530 mL PEEP:  [5 cmH20] 5 cmH20 Plateau Pressure:  [17 cmH20] 17 cmH20   Intake/Output Summary (Last 24 hours) at 12/12/2020 0732 Last data filed at 12/12/2020 0600 Gross per 24 hour  Intake 1187.06 ml  Output 2075 ml  Net -887.94 ml    Filed Weights   12/11/20 0015 12/11/20 0100  Weight: 73.8 kg 71 kg    Examination: General:  NAD HEENT: MM pink/moist; Plum in place Neuro: Aox3; mae CV: s1s2, RRR, no m/r/g PULM:   dim clear BS bilaterally; on [place] GI: soft, bsx4 active  Extremities: warm/dry, no edema  Skin: no rashes or lesions    Labs: K 3.9, Mag 2.1 AST 444 (579) ALT 445 (684)   9/1 CT Abdomen: b/l lower lobes aspiration penumonia vs pneumonititis. Cholelithiasis with findings suggestive of acute cholecystitis.urinary bladder wall thickening (malignancy or infection). Left 2nd-7th acute rib fractures and right 3rd-5th rib fractures s/p cpr. Indeterminate 1.6 cm right renal lesion (recommend MRI when awake and following commands) 9/1 CT head: negative 9/1 Ultrasound: Edematous gallbladder wall without evidence of cholelithiasis or acute cholecystitis. Further evaluation with a nuclear medicine hepatobiliary scan is recommended if this remains of clinical concern.   Resolved Hospital Problem list     Assessment & Plan:  Cardiac arrest-- witnessed with bystander CPT, VT/VF. I suspect he had a primary arrhythmia related to chronic CM given lack of preceding symptoms and EKG without ischemic changes. Hx of HFrEF: 2018 echo: EF 20-25%; 12/11/2020 echo: EF 40-45% Hx of HTN P: -continue telemetry monitoring -cards consulted -plan for cath today -trend electrolytes and replete as needed -continue heparin for possible ACS -hold home antihypertensives   Shock: improving: presumed mixed cardiogenic and distributive from sedation P: -off levo -continue zosyn for aspiration pneumonia coverage   Acute respiratory failure with hypoxia Aspiration pneumonitis vs pneumonia P: -patient extubated yesterday -continue to wean [place] fio2 for  sats >92% -OOB/PT/OT -Pulm toiletry: IS and flutter -trach aspirate pending; continue zosyn for aspiration ppx   Rib fractures- multiple bilaterally s/p cpr P: -prn tramadol for pain -continue pulm toiletry  AKI: improving P: -trend bmp/uop -remove foley post heart cath -renally dose meds, avoid nephrotoxic meds  Elevated transaminases due to cardiac  arrest: trending down P: -trend cmp  Polysubstance abuse hx (alcohol and cocaine): UDS positive for benzos (likely given in ED) P: -monitor for signs of withdrawals -avoid beta blockers  Lactic acidosis: Improving P: -maintain adequate perfusion  Possible cholecystitis: [place]: without evidence of cholelithiasis or acute cholecystitis. Further evaluation with a nuclear medicine hepatobiliary scan is recommended if this remains of clinical concern. P: -trend cbc/fever -continue zosyn -consider hepatobiliary scan if continue to clinically decline  DM with hyperglycemia; prev A1c 9.7 in 07/2020 P: -SSI and CBG monitoring  Abnormal bladder on CT, renal 1.6cm lesion P: -consider OP urology consult when stable -radiology recommending eventual MRI of R renal lesion for better characterization when patient awake and following commands    Best Practice (right click and "Reselect all SmartList Selections" daily)   Diet/type: NPO DVT prophylaxis: prophylactic heparin  GI prophylaxis: PPI Lines: N/A Foley:  N/A Code Status:  full code Last date of multidisciplinary goals of care discussion (daughter updated at bedside 9/1]        JD Daryel [facility] 12/12/2020, 7:32 AM  Please see [email or web address] for pager details.  From 7A-7P if no response, please call [phone]. After hours, please call ELink [phone].

## 2020-12-12 NOTE — Progress Notes (Signed)
OT Cancellation Note  Patient Details Name: James [name]. MRN: [id number] DOB: 1961-01-02   Cancelled Treatment:    Reason Eval/Treat Not Completed: Patient at procedure or test/ unavailable (Cath lab. Will return as schedule allows.)  Analiz Tvedt M Yajahira Tison Aydn Ferrara MSOT, OTR/L Acute Rehab Pager: [phone] Office: [phone] 12/12/2020, 10:40 AM

## 2020-12-12 NOTE — Progress Notes (Signed)
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 AM    Final    US Abdomen Limited RUQ (LIVER/GB)  Result Date: 12/11/2020 CLINICAL DATA:  Cholecystitis. EXAM: ULTRASOUND ABDOMEN LIMITED RIGHT UPPER QUADRANT COMPARISON:  None. FINDINGS: Gallbladder: No gallstones are seen. The gallbladder wall is thickened (8.05 mm) and edematous. No sonographic Murphy sign noted by sonographer. Common bile duct: Diameter: 4.49 mm Liver: No focal lesion identified. Within normal limits in parenchymal echogenicity. Portal vein is patent on color Doppler imaging with normal direction of blood flow towards the liver. Other: None. IMPRESSION: Edematous gallbladder wall without evidence of cholelithiasis or acute cholecystitis. Further evaluation with a nuclear medicine hepatobiliary scan is recommended if this remains of clinical concern. Electronically Signed   By: Aram Candela M.D.   On: 12/11/2020 00:30       For questions or updates, please contact [facility] Please consult [email or web address] for contact info under        Signed, Bryan Lemma, [name]  12/12/2020, 7:56 AM

## 2020-12-13 ENCOUNTER — Other Ambulatory Visit: Payer: Self-pay

## 2020-12-13 ENCOUNTER — Encounter (HOSPITAL_COMMUNITY): Payer: Self-pay | Admitting: Pulmonary Disease

## 2020-12-13 LAB — CBC
HCT: 41.8 % (ref 39.0–52.0)
Hemoglobin: 13.7 g/dL (ref 13.0–17.0)
MCH: 28 pg (ref 26.0–34.0)
MCHC: 32.8 g/dL (ref 30.0–36.0)
MCV: 85.3 fL (ref 80.0–100.0)
Platelets: 184 10*3/uL (ref 150–400)
RBC: 4.9 MIL/uL (ref 4.22–5.81)
RDW: 15.6 % — ABNORMAL HIGH (ref 11.5–15.5)
WBC: 6.5 10*3/uL (ref 4.0–10.5)
nRBC: 0 % (ref 0.0–0.2)

## 2020-12-13 LAB — COMPREHENSIVE METABOLIC PANEL
ALT: 303 U/L — ABNORMAL HIGH (ref 0–44)
AST: 288 U/L — ABNORMAL HIGH (ref 15–41)
Albumin: 3.1 g/dL — ABNORMAL LOW (ref 3.5–5.0)
Alkaline Phosphatase: 69 U/L (ref 38–126)
Anion gap: 8 (ref 5–15)
BUN: 18 mg/dL (ref 8–23)
CO2: 24 mmol/L (ref 22–32)
Calcium: 8.4 mg/dL — ABNORMAL LOW (ref 8.9–10.3)
Chloride: 104 mmol/L (ref 98–111)
Creatinine, Ser: 1.03 mg/dL (ref 0.61–1.24)
GFR, Estimated: >60 mL/min (ref >60)
Glucose, Bld: 182 mg/dL — ABNORMAL HIGH (ref 70–99)
Potassium: 4.3 mmol/L (ref 3.5–5.1)
Sodium: 136 mmol/L (ref 135–145)
Total Bilirubin: 0.4 mg/dL (ref 0.3–1.2)
Total Protein: 5.7 g/dL — ABNORMAL LOW (ref 6.5–8.1)

## 2020-12-13 LAB — MAGNESIUM: Magnesium: 2.2 mg/dL (ref 1.7–2.4)

## 2020-12-13 LAB — GLUCOSE, CAPILLARY
Glucose-Capillary: 225 mg/dL — ABNORMAL HIGH (ref 70–99)
Glucose-Capillary: 84 mg/dL (ref 70–99)
Glucose-Capillary: 178 mg/dL — ABNORMAL HIGH (ref 70–99)
Glucose-Capillary: 234 mg/dL — ABNORMAL HIGH (ref 70–99)
Glucose-Capillary: 214 mg/dL — ABNORMAL HIGH (ref 70–99)

## 2020-12-13 MED ORDER — CARVEDILOL 6.25 MG PO TABS
6.2500 mg | ORAL_TABLET | Freq: Two times a day (BID) | ORAL | Status: DC
  Administered 2020-12-13: 6.25 mg via ORAL
  Filled 2020-12-13: qty 1

## 2020-12-13 MED ORDER — KETOROLAC TROMETHAMINE 15 MG/ML IJ SOLN
15.0000 mg | Freq: Four times a day (QID) | INTRAMUSCULAR | Status: DC | PRN
  Administered 2020-12-13 – 2020-12-14 (×2): 15 mg via INTRAVENOUS
  Filled 2020-12-13 (×2): qty 1

## 2020-12-13 MED ORDER — OXYCODONE-ACETAMINOPHEN 5-325 MG PO TABS
1.0000 | ORAL_TABLET | ORAL | Status: DC | PRN
Start: 2020-12-13 — End: 2020-12-17
  Administered 2020-12-13 – 2020-12-15 (×5): 1 via ORAL
  Filled 2020-12-13 (×5): qty 1

## 2020-12-13 MED ORDER — INSULIN GLARGINE-YFGN 100 UNIT/ML ~~LOC~~ SOLN
12.0000 [IU] | Freq: Every day | SUBCUTANEOUS | Status: DC
  Administered 2020-12-13 – 2020-12-14 (×2): 12 [IU] via SUBCUTANEOUS
  Filled 2020-12-13 (×2): qty 0.12

## 2020-12-13 MED ORDER — INSULIN ASPART 100 UNIT/ML IJ SOLN
0.0000 [IU] | Freq: Three times a day (TID) | INTRAMUSCULAR | Status: DC
  Administered 2020-12-13: 3 [IU] via SUBCUTANEOUS
  Administered 2020-12-13: 5 [IU] via SUBCUTANEOUS
  Administered 2020-12-14: 3 [IU] via SUBCUTANEOUS
  Administered 2020-12-14: 8 [IU] via SUBCUTANEOUS
  Administered 2020-12-14: 15 [IU] via SUBCUTANEOUS
  Administered 2020-12-15 (×2): 3 [IU] via SUBCUTANEOUS
  Administered 2020-12-15: 5 [IU] via SUBCUTANEOUS
  Administered 2020-12-16 – 2020-12-17 (×2): 3 [IU] via SUBCUTANEOUS

## 2020-12-13 MED ORDER — INSULIN ASPART 100 UNIT/ML IJ SOLN
0.0000 [IU] | Freq: Every day | INTRAMUSCULAR | Status: DC
  Administered 2020-12-13: 2 [IU] via SUBCUTANEOUS
  Administered 2020-12-16: 3 [IU] via SUBCUTANEOUS

## 2020-12-13 MED ORDER — LIDOCAINE 5 % EX PTCH
1.0000 | MEDICATED_PATCH | CUTANEOUS | Status: DC
  Administered 2020-12-13 – 2020-12-15 (×4): 1 via TRANSDERMAL
  Filled 2020-12-13 (×5): qty 1

## 2020-12-13 NOTE — Progress Notes (Signed)
Transferred to 6E30 from 2H. Received report from [place], [place]. Patient A&O, placed on telemetry, instructed on use of call bell. Skin assessed and free of breakdown. Denies pain at this time. Will continue to monitor ans treat per MD orders.

## 2020-12-13 NOTE — Progress Notes (Signed)
Physical Therapy Treatment Patient Details Name: James [name]. MRN: [id number] DOB: April 30, 1960 Today's Date: 12/13/2020    History of Present Illness 60 yo male presenting 8/31 after cardiac arrest sustained at home. Pt recieved 4 rounds of epinephrine, multiple rounds of chest compressions, and x7 rounds of defibrillation prior to ROSC upon arrival to ED. 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    PT Comments    The pt continues to make good progress with mobility, but demos continued deficits in memory, problem solving, and safety awareness at this time that impact his ability to mobilize in both familiar and unfamiliar environments. The pt reports no memory of prior therapy sessions, injuries, or reason for hospitalization. Was unable to wayfind to his room without max cues and repeated cues for sequencing. The pt demos little awareness of these deficits which increases my concern for his ability to make safe decisions in regards to mobility without continued assist. Up to modA needed to maintain upright with addition of balance challenges involving head turns, direction changes, and narrow BOS.    Follow Up Recommendations  CIR     Equipment Recommendations  Rolling walker with 5" wheels    Recommendations for Other Services       Precautions / Restrictions Precautions Precautions: Fall Restrictions Weight Bearing Restrictions: No    Mobility  Bed Mobility Overal bed mobility: Needs Assistance Bed Mobility: Rolling;Sidelying to Sit Rolling: Min guard Sidelying to sit: Min guard       General bed mobility comments: minG with education on use of pillow, pt with no recall of these instructions from prior session    Transfers Overall transfer level: Needs assistance Equipment used: None Transfers: Sit to/from Stand Sit to Stand: Min guard         General transfer comment: minG for safety and line  management. pt not needing physical assist to power up or steady  Ambulation/Gait Ambulation/Gait assistance: Min guard;Min assist Gait Distance (Feet): 200 Feet Assistive device: None Gait Pattern/deviations: Step-through pattern;Decreased stride length;Drifts right/left;Trunk flexed Gait velocity: decreased   General Gait Details: increased frequency of needing minA at this time, pt with slowed gait speed but no overt LOB with steady walking. unable to recall room number until reminded x8, no insight to way finding on unit or using context clues for mobility     Balance Overall balance assessment: Needs assistance Sitting-balance support: No upper extremity supported;Feet supported Sitting balance-Leahy Scale: Good     Standing balance support: Bilateral upper extremity supported Standing balance-Leahy Scale: Poor Standing balance comment: no UE support for gait but needing minA to steady without UE support and up to modA to steady with balance challenged             High level balance activites: Side stepping;Backward walking;Direction changes;Turns;Sudden stops;Head turns High Level Balance Comments: pt with up to modA to manage balance with challenges            Cognition Arousal/Alertness: Awake/alert Behavior During Therapy: [facility] for tasks assessed/performed Overall Cognitive Status: Impaired/Different from baseline Area of Impairment: Memory;Following commands;Safety/judgement;Awareness;Problem solving;Attention                     Memory: Decreased short-term memory;Decreased recall of precautions Following Commands: Follows one step commands consistently Safety/Judgement: Decreased awareness of safety;Decreased awareness of deficits Awareness: Intellectual Problem Solving: Decreased initiation;Difficulty sequencing;Requires verbal cues General Comments: pt with no memory of therapy session yesterday,  no memory of incident or injuries that landed him in  hospital. is aware he is in hospital, but unable to state why or unit or room number. unable to way find or problem solve when given context clues      Exercises      General Comments General comments (skin integrity, edema, etc.): VSS on RA      Pertinent Vitals/Pain Pain Assessment: 0-10 Pain Score: 3  Pain Location: chest Pain Descriptors / Indicators: Sore Pain Intervention(s): Limited activity within patient's tolerance;Monitored during session;Repositioned     PT Goals (current goals can now be found in the care plan section) Acute Rehab PT Goals Patient Stated Goal: return home and to independence PT Goal Formulation: With patient Time For Goal Achievement: 12/26/20 Potential to Achieve Goals: Good Progress towards PT goals: Progressing toward goals    Frequency    Min 4X/week      PT Plan Current plan remains appropriate       AM-PAC PT "6 Clicks" Mobility   Outcome Measure  Help needed turning from your back to your side while in a flat bed without using bedrails?: A Little Help needed moving from lying on your back to sitting on the side of a flat bed without using bedrails?: A Little Help needed moving to and from a bed to a chair (including a wheelchair)?: A Little Help needed standing up from a chair using your arms (e.g., wheelchair or bedside chair)?: A Little Help needed to walk in hospital room?: A Lot Help needed climbing 3-5 steps with a railing? : A Lot 6 Click Score: 16    End of Session Equipment Utilized During Treatment: Gait belt Activity Tolerance: Patient tolerated treatment well Patient left: in chair;with nursing/sitter in room Nurse Communication: Mobility status PT Visit Diagnosis: Other abnormalities of gait and mobility (R26.89)     Time: 0454-0981 PT Time Calculation (min) (ACUTE ONLY): 27 min  Charges:  $Gait Training: 8-22 mins $Neuromuscular Re-education: 8-22 mins                     Vickki Muff, PT, DPT   Acute  Rehabilitation Department Pager #: [phone]   James [name] 12/13/2020, 5:20 PM

## 2020-12-13 NOTE — Progress Notes (Signed)
Inpatient Rehab Admissions Coordinator:   Per therapy recommendations patient was screened for CIR candidacy by Megan Salon, MS, CCC-SLP. At this time, Pt. Appears to demonstrate medical necessity, functional decline, and ability to tolerate intensity of CIR. Pt. is a potential candidate for CIR. I will request  order for rehab consult per protocol for full assessment. Please contact me any with questions.. Note that Pt. Is already ad min A-min g and will be self-pay, so cost may be a barrier to getting pt. To agree to admission.   Megan Salon, MS, CCC-SLP Rehab Admissions Coordinator  [phone] (celll) [phone] (office)

## 2020-12-13 NOTE — Progress Notes (Addendum)
   NAME:  James Gramm., MRN:  [id number], DOB:  26-Dec-1960, LOS: 2 ADMISSION DATE:  12/10/2020, CONSULTATION DATE:  12/10/20 REFERRING MD:  Kommor- ED, CHIEF COMPLAINT:  cardiac arrest   History of Present Illness:  James [name] is a 60 y/o gentleman with a history of NICM with recovered EF, HTN, DM who presented post-cardiac arrest. He was watching TV with his daughter and dozed off. He began foaming at the mouth and she called 911 and initiated resuscitation. EMS arrived in 3 minutes and he was in VT/VF. He received defibrillations amiodarone 450mg  total, and epinephrine x 4 doses. He was intubated in the field and had achieved ROSC upon arrival to the ED. He had not complained of new or unusual symptoms leading up to this. In the ED he required sedation due to fighting restraints and attempting to self-extubate. He is currently on a NE infusions. His daughter and brother are at bedside.   Pertinent  Medical History  DM HTN NICM> recovered EF Cocaine abuse  Significant Hospital Events: Including procedures, antibiotic start and stop dates in addition to other pertinent events   8/31 admitted s/p card arrest; intubated  9/1: extubated; weaned off levo 9/2: heart cath neg, EP consult  Interim History / Subjective:  No events, mentation slowly improving.  Objective   Blood pressure 136/73, pulse 90, temperature 97.6 F (36.4 C), temperature source Oral, resp. rate 13, height 6' (1.829 m), weight 69.2 kg, SpO2 99 %.        Intake/Output Summary (Last 24 hours) at 12/13/2020 0746 Last data filed at 12/13/2020 0354 Gross per 24 hour  Intake 945.11 ml  Output 1720 ml  Net -774.89 ml    Filed Weights   12/11/20 0015 12/11/20 0100 12/12/20 0700  Weight: 73.8 kg 71 kg 69.2 kg    Examination: Constitutional: no distress  Eyes: EOMI, pupils equal Ears, nose, mouth, and throat: MMM, trachea midline Cardiovascular: RRR, ext warm Respiratory: Clear, no wheezing or accessory muscle  use Gastrointestinal: soft, +BS Skin: No rashes, normal turgor Neurologic: Moves all 4 ext to command Psychiatric: slowly improving mentation but short term memory still seems impaired  LFTs improving Cr stable CBC stable  Resolved Hospital Problem list     Assessment & Plan:  Witnessed Vfib arrest in setting of known cardiomyopathy, primary arrhythmia likely culprit in light of cath Post arrest encephalopathy improving Aspiration pneumonitis Rib fx from CPR Shock liver post CPR Post arrest AKI resolved Abnormal bladder, gallbladder, kidneys on CT DM2 with hyperglycemia  - Progressive mobility, appreciate PT/OT input, may benefit from CIR - GDMT per cardiology team - Cardiac MRI and ICD placement next week - Graded pain regimen with tylenol/toradol/percocet - PCP f/u regarding various abnormalities on CT A/P - Restart home lantus at lower dosing, SSI, adjust PRN - Rocephin x 5 days for aspiration should be fine - Stable for transfer to floor, appreciate TRH taking over starting 12/14/20  02/13/21 MD PCCM

## 2020-12-13 NOTE — Progress Notes (Signed)
Progress Note  Patient Name: Norman Piacentini. Date of Encounter: 12/13/2020  [facility] Cardiologist: Olga Millers, MD   Patient Profile     60 y.o. male 60 y.o. male with a hx of NICM (20-25%, recovered to 55%-cath only noted 50% LAD), cocaine abuse (negative UDS screen with current admission), HTN, DM2, mild to moderate MR who is being seen 12/11/2020 for the evaluation of Vfib arrest at the request of Dr. Chestine Spore (PCCM)  He presented to ER 12/10/2020 with V. fib/V. tach arrest.  Was resting in his chair, and "dozed off".  Was noted to be moaning and foaming at the mouth.  EMS was contacted but CPR not started until arrival.  Reportedly 7 defibrillation shocks 3 total of 450 mg IV amiodarone and 4 rounds of epinephrine restoring ROSC.  Extubated 12/11/2020-but remains confused.  Was found on echocardiogram to have moderate reduced EF of 40 to 45%.  Assessment & Plan    VT/VF cardiac arrest:  Presumed due to a shockable rhythm.  He had 7 defibrillations along with 450 mg of amiodarone and 4 rounds of epinephrine.  No obvious coronary artery disease to explain his arrhythmias.  We Yobana Culliton plan to increase carvedilol today.  As he does not have coronary artery disease to explain his issues, we Dayshawn Irizarry plan for cardiac MRI on Tuesday.  Pending MRI, possible ICD implant thereafter.   Hx of HFrEF: Ejection fraction 40 to 45%.  Currently adjusting heart failure medications.     Shock: No further issues.  Weaned off epinephrine 12/11/2020   Cocaine abuse: Has been abstinent for 2 years   DM: Hemoglobin A1c 10.7.  Dynastee Brummell likely need SGLT2 inhibitor at discharge.   Elevated transaminases: In the setting of cardiac arrest.  Slowly recovering.    ----------------------------------------------------------------------------------------------------------------------  Subjective   Doing well today.  No chest pain or shortness of breath.  Remains with memory issues.  Left heart catheterization  performed yesterday without an obvious cause for ventricular arrhythmias.  Inpatient Medications    Scheduled Meds:  aspirin  81 mg Oral Daily   atorvastatin  80 mg Oral Daily   carvedilol  3.125 mg Oral BID WC   Chlorhexidine Gluconate Cloth  6 each Topical Daily   insulin aspart  0-15 Units Subcutaneous TID WC   insulin aspart  0-5 Units Subcutaneous QHS   insulin glargine-yfgn  12 Units Subcutaneous Daily   mouth rinse  15 mL Mouth Rinse BID   pantoprazole  40 mg Oral Daily   polyethylene glycol  17 g Oral Daily   sodium chloride flush  3 mL Intravenous Q12H   Continuous Infusions:  sodium chloride 10 mL/hr at 12/12/20 1000   sodium chloride     cefTRIAXone (ROCEPHIN)  IV Stopped (12/12/20 1719)   PRN Meds: sodium chloride, acetaminophen, docusate, ketorolac, ondansetron (ZOFRAN) IV, oxyCODONE-acetaminophen, polyethylene glycol, sodium chloride flush, traMADol   Vital Signs    Vitals:   12/13/20 0400 12/13/20 0500 12/13/20 0700 12/13/20 0800  BP: 115/78 136/73 122/86 139/85  Pulse: 79 90 87 97  Resp: 12 13 12  (!) 23  Temp:   98.5 F (36.9 C)   TempSrc:      SpO2: 98% 99% 98% 99%  Weight:      Height:        Intake/Output Summary (Last 24 hours) at 12/13/2020 0845 Last data filed at 12/13/2020 0800 Gross per 24 hour  Intake 1017.22 ml  Output 1570 ml  Net -552.78 ml  Last 3 Weights 12/12/2020 12/11/2020 12/11/2020  Weight (lbs) 152 lb 8.9 oz 156 lb 8.4 oz 162 lb 11.2 oz  Weight (kg) 69.2 kg 71 kg 73.8 kg  Some encounter information is confidential and restricted. Go to Review Flowsheets activity to see all data.      Telemetry    Sinus rhythm with PACs-personally reviewed  ECG    None new  Physical Exam   GEN: Well nourished, well developed, in no acute distress  HEENT: normal  Neck: no JVD, carotid bruits, or masses Cardiac: RRR; no murmurs, rubs, or gallops,no edema  Respiratory:  clear to auscultation bilaterally, normal work of breathing GI: soft,  nontender, nondistended, + BS MS: no deformity or atrophy  Skin: warm and dry Neuro:  Strength and sensation are intact, mild confusion Psych: euthymic mood, full affect   Labs    High Sensitivity Troponin:   Recent Labs  Lab 12/10/20 2145 12/11/20 0029 12/11/20 0940 12/11/20 1142  TROPONINIHS 74* 465* 2,930* 2,837*       Chemistry Recent Labs  Lab 12/10/20 2145 12/10/20 2154 12/11/20 0151 12/11/20 0458 12/12/20 0029 12/13/20 0105  NA 139   < > 136 140 137 136  K 4.4   < > 5.0 3.9 3.9 4.3  CL 106   < > 107  --  107 104  CO2 17*  --  18*  --  22 24  GLUCOSE 317*   < > 290*  --  114* 182*  BUN 20   < > 21  --  18 18  CREATININE 1.44*   < > 1.20  --  1.12 1.03  CALCIUM 8.3*  --  8.2*  --  8.4* 8.4*  PROT 5.6*  --   --   --  5.9* 5.7*  ALBUMIN 3.2*  --   --   --  3.4* 3.1*  AST 579*  --   --   --  444* 288*  ALT 684*  --   --   --  445* 303*  ALKPHOS 116  --   --   --  79 69  BILITOT 0.9  --   --   --  0.8 0.4  GFRNONAA 55*  --  >60  --  >60 >60  ANIONGAP 16*  --  11  --  8 8   < > = values in this interval not displayed.      Hematology Recent Labs  Lab 12/11/20 0151 12/11/20 0458 12/12/20 0029 12/13/20 0105  WBC 11.3*  --  8.7 6.5  RBC 5.03  --  4.66 4.90  HGB 14.2 13.6 12.9* 13.7  HCT 44.2 40.0 40.2 41.8  MCV 87.9  --  86.3 85.3  MCH 28.2  --  27.7 28.0  MCHC 32.1  --  32.1 32.8  RDW 15.2  --  15.5 15.6*  PLT 187  --  169 184     BNPNo results for input(s): BNP, PROBNP in the last 168 hours.   DDimer No results for input(s): DDIMER in the last 168 hours.    Cardiac Studies - Summary   Right and Left Heart Cath 04/2016: Moderate mid LAD 50% stenosis otherwise minimal CAD consistent with nonischemic cardiomyopathy.  Normal right heart pressures with mildly elevated left-sided filling pressures.  No pulmonary hypertension.. 2D Echo 12/11/2020: EF 40 to 45% global HK.  Normal diastolic parameters.  Normal RV size and function.  Normal  valves.  Radiology    CARDIAC CATHETERIZATION  Addendum  Date: 12/12/2020     Mid LAD lesion is 50% stenosed. Mild to moderate nonobstructive CAD with calcification of the mid LAD with previously noted 50% stenosis not significantly changed.  Normal intermediate, circumflex, and RCA. LVEDP 11 mmHg. RECOMMENDATION: Medical therapy.   Result Date: 12/12/2020   Mid LAD lesion is 50% stenosed. Mild to moderate nonobstructive CAD with calcification of the mid LAD with previously noted 50% stenosis not significantly changed.  Normal intermediate, circumflex, and RCA. LVEDP 11 mmHg. RECOMMENDATION: Medical therapy.  Yes this is Dr. Tresa Endo is a 15 this is Dr. Landry Dyke that Velna Hatchet with Mr. Marquette Saa okay okay   DG Chest Port 1 View  Result Date: 12/12/2020 CLINICAL DATA:  Shortness of breath, respiratory failure, cardiac arrest EXAM: PORTABLE CHEST 1 VIEW COMPARISON:  Chest radiograph 12/10/2020. FINDINGS: The patient has been extubated. The enteric catheter has been removed. A curvilinear catheter projecting over the lower thoracic spine may be external to the patient. The cardiomediastinal silhouette is stable. There is vascular congestion without overt pulmonary edema, similar to the prior study. Patchy opacities in the lung bases likely reflect subsegmental atelectasis. The upper lungs are well aerated. There is silhouetting of the right costophrenic angle which may reflect a new trace pleural effusion. There is no left pleural effusion. There is no pneumothorax. The bones are stable. IMPRESSION: 1. Trace right pleural effusion and bibasilar subsegmental atelectasis. 2. Vascular congestion without overt pulmonary edema, not significantly changed. Electronically Signed   By: Lesia Hausen M.D.   On: 12/12/2020 08:49   ECHOCARDIOGRAM COMPLETE  Result Date: 12/11/2020    ECHOCARDIOGRAM REPORT   Patient Name:   JAKARIUS FLAMENCO Date of Exam: 12/11/2020 Medical Rec #:  [id number]        Height:       72.0 in Accession #:     [id number]       Weight:       156.5 lb Date of Birth:  1960/12/30        BSA:          1.920 m Patient Age:    60 years         BP:           114/71 mmHg Patient Gender: M                HR:           97 bpm. Exam Location:  Inpatient Procedure: 2D Echo, Cardiac Doppler and Color Doppler Indications:    Cardiac arrest (HCC) [427.5.ICD-9-CM]  History:        Patient has prior history of Echocardiogram examinations, most                 recent 10/14/2016. Cardiomyopathy; Risk Factors:Diabetes and                 Hypertension.  Sonographer:    Eulah Pont RDCS Referring Phys: [id number] RAHUL P DESAI IMPRESSIONS  1. Left ventricular ejection fraction, by estimation, is 40 to 45%. The left ventricle has mildly decreased function. The left ventricle demonstrates global hypokinesis. Left ventricular diastolic parameters were normal.  2. Right ventricular systolic function is normal. The right ventricular size is normal. There is normal pulmonary artery systolic pressure.  3. The mitral valve is normal in structure. Trivial mitral valve regurgitation. No evidence of mitral stenosis.  4. The aortic valve is normal in structure. Aortic valve regurgitation is not visualized. No aortic stenosis is present.  5. The inferior  vena cava is normal in size with greater than 50% respiratory variability, suggesting right atrial pressure of 3 mmHg. FINDINGS  Left Ventricle: Left ventricular ejection fraction, by estimation, is 40 to 45%. The left ventricle has mildly decreased function. The left ventricle demonstrates global hypokinesis. The left ventricular internal cavity size was normal in size. There is  no left ventricular hypertrophy. Left ventricular diastolic parameters were normal. Right Ventricle: The right ventricular size is normal. No increase in right ventricular wall thickness. Right ventricular systolic function is normal. There is normal pulmonary artery systolic pressure. The tricuspid regurgitant velocity is 1.44  m/s, and  with an assumed right atrial pressure of 3 mmHg, the estimated right ventricular systolic pressure is 11.3 mmHg. Left Atrium: Left atrial size was normal in size. Right Atrium: Right atrial size was normal in size. Pericardium: There is no evidence of pericardial effusion. Mitral Valve: The mitral valve is normal in structure. Trivial mitral valve regurgitation. No evidence of mitral valve stenosis. Tricuspid Valve: The tricuspid valve is normal in structure. Tricuspid valve regurgitation is not demonstrated. No evidence of tricuspid stenosis. Aortic Valve: The aortic valve is normal in structure. Aortic valve regurgitation is not visualized. No aortic stenosis is present. Pulmonic Valve: The pulmonic valve was normal in structure. Pulmonic valve regurgitation is not visualized. No evidence of pulmonic stenosis. Aorta: The aortic root is normal in size and structure. Venous: The inferior vena cava is normal in size with greater than 50% respiratory variability, suggesting right atrial pressure of 3 mmHg. IAS/Shunts: No atrial level shunt detected by color flow Doppler.  LEFT VENTRICLE PLAX 2D LVIDd:         5.00 cm      Diastology LVIDs:         3.50 cm      LV e' medial:    6.58 cm/s LV PW:         1.00 cm      LV E/e' medial:  12.6 LV IVS:        1.00 cm      LV e' lateral:   6.19 cm/s LVOT diam:     2.20 cm      LV E/e' lateral: 13.4 LV SV:         68 LV SV Index:   35 LVOT Area:     3.80 cm  LV Volumes (MOD) LV vol d, MOD A2C: 127.0 ml LV vol d, MOD A4C: 122.0 ml LV vol s, MOD A2C: 76.4 ml LV vol s, MOD A4C: 72.6 ml LV SV MOD A2C:     50.6 ml LV SV MOD A4C:     122.0 ml LV SV MOD BP:      47.4 ml RIGHT VENTRICLE RV S prime:     14.80 cm/s TAPSE (M-mode): 2.4 cm LEFT ATRIUM             Index       RIGHT ATRIUM           Index LA diam:        2.90 cm 1.51 cm/m  RA Area:     12.80 cm LA Vol (A2C):   31.7 ml 16.51 ml/m RA Volume:   33.00 ml  17.19 ml/m LA Vol (A4C):   27.5 ml 14.32 ml/m LA Biplane  Vol: 31.0 ml 16.15 ml/m  AORTIC VALVE LVOT Vmax:   109.07 cm/s LVOT Vmean:  72.867 cm/s LVOT VTI:    0.179 m  AORTA Ao Root diam:  3.20 cm Ao Asc diam:  3.20 cm MITRAL VALVE               TRICUSPID VALVE MV Area (PHT): 2.95 cm    TR Peak grad:   8.3 mmHg MV Decel Time: 257 msec    TR Vmax:        144.00 cm/s MV E velocity: 82.90 cm/s MV A velocity: 74.20 cm/s  SHUNTS MV E/A ratio:  1.12        Systemic VTI:  0.18 m                            Systemic Diam: 2.20 cm Donato Schultz MD Electronically signed by Donato Schultz MD Signature Date/Time: 12/11/2020/11:31:51 AM    Final        For questions or updates, please contact [facility] Please consult [email or web address] for contact info under        Signed, Akshar Starnes Jorja Loa, MD  12/13/2020, 8:45 AM

## 2020-12-14 ENCOUNTER — Inpatient Hospital Stay (HOSPITAL_COMMUNITY): Payer: Self-pay

## 2020-12-14 DIAGNOSIS — I428 Other cardiomyopathies: Secondary | ICD-10-CM

## 2020-12-14 LAB — CBC
HCT: 35.9 % — ABNORMAL LOW (ref 39.0–52.0)
Hemoglobin: 11.7 g/dL — ABNORMAL LOW (ref 13.0–17.0)
MCH: 27.8 pg (ref 26.0–34.0)
MCHC: 32.6 g/dL (ref 30.0–36.0)
MCV: 85.3 fL (ref 80.0–100.0)
Platelets: 165 10*3/uL (ref 150–400)
RBC: 4.21 MIL/uL — ABNORMAL LOW (ref 4.22–5.81)
RDW: 15.5 % (ref 11.5–15.5)
WBC: 4.4 10*3/uL (ref 4.0–10.5)
nRBC: 0 % (ref 0.0–0.2)

## 2020-12-14 LAB — HEPATIC FUNCTION PANEL
ALT: 210 U/L — ABNORMAL HIGH (ref 0–44)
AST: 230 U/L — ABNORMAL HIGH (ref 15–41)
Albumin: 2.7 g/dL — ABNORMAL LOW (ref 3.5–5.0)
Alkaline Phosphatase: 58 U/L (ref 38–126)
Bilirubin, Direct: 0.1 mg/dL (ref 0.0–0.2)
Indirect Bilirubin: 0.5 mg/dL (ref 0.3–0.9)
Total Bilirubin: 0.6 mg/dL (ref 0.3–1.2)
Total Protein: 5.3 g/dL — ABNORMAL LOW (ref 6.5–8.1)

## 2020-12-14 LAB — BASIC METABOLIC PANEL
Anion gap: 6 (ref 5–15)
BUN: 16 mg/dL (ref 8–23)
CO2: 24 mmol/L (ref 22–32)
Calcium: 8.2 mg/dL — ABNORMAL LOW (ref 8.9–10.3)
Chloride: 104 mmol/L (ref 98–111)
Creatinine, Ser: 1.03 mg/dL (ref 0.61–1.24)
GFR, Estimated: >60 mL/min (ref >60)
Glucose, Bld: 376 mg/dL — ABNORMAL HIGH (ref 70–99)
Potassium: 4.2 mmol/L (ref 3.5–5.1)
Sodium: 134 mmol/L — ABNORMAL LOW (ref 135–145)

## 2020-12-14 LAB — GLUCOSE, CAPILLARY
Glucose-Capillary: 363 mg/dL — ABNORMAL HIGH (ref 70–99)
Glucose-Capillary: 151 mg/dL — ABNORMAL HIGH (ref 70–99)
Glucose-Capillary: 281 mg/dL — ABNORMAL HIGH (ref 70–99)
Glucose-Capillary: 160 mg/dL — ABNORMAL HIGH (ref 70–99)

## 2020-12-14 LAB — CALCIUM, IONIZED: Calcium, Ionized, Serum: 4.5 mg/dL (ref 4.5–5.6)

## 2020-12-14 LAB — PHOSPHORUS: Phosphorus: 3 mg/dL (ref 2.5–4.6)

## 2020-12-14 LAB — MAGNESIUM: Magnesium: 2 mg/dL (ref 1.7–2.4)

## 2020-12-14 MED ORDER — INSULIN GLARGINE-YFGN 100 UNIT/ML ~~LOC~~ SOLN
15.0000 [IU] | Freq: Once | SUBCUTANEOUS | Status: DC
  Filled 2020-12-14: qty 0.15

## 2020-12-14 MED ORDER — CARVEDILOL 12.5 MG PO TABS
12.5000 mg | ORAL_TABLET | Freq: Two times a day (BID) | ORAL | Status: DC
  Filled 2020-12-14: qty 1

## 2020-12-14 MED ORDER — INSULIN GLARGINE-YFGN 100 UNIT/ML ~~LOC~~ SOLN
25.0000 [IU] | Freq: Every day | SUBCUTANEOUS | Status: DC
  Administered 2020-12-15 – 2020-12-16 (×2): 25 [IU] via SUBCUTANEOUS
  Filled 2020-12-14 (×3): qty 0.25

## 2020-12-14 MED ORDER — CARVEDILOL 12.5 MG PO TABS
12.5000 mg | ORAL_TABLET | Freq: Two times a day (BID) | ORAL | Status: DC
  Administered 2020-12-14 – 2020-12-16 (×5): 12.5 mg via ORAL
  Filled 2020-12-14 (×4): qty 1

## 2020-12-14 MED ORDER — GADOBUTROL 1 MMOL/ML IV SOLN
10.0000 mL | Freq: Once | INTRAVENOUS | Status: AC | PRN
  Administered 2020-12-14: 10 mL via INTRAVENOUS

## 2020-12-14 MED ORDER — INSULIN ASPART 100 UNIT/ML IJ SOLN
4.0000 [IU] | Freq: Three times a day (TID) | INTRAMUSCULAR | Status: DC
  Administered 2020-12-14 – 2020-12-16 (×5): 4 [IU] via SUBCUTANEOUS

## 2020-12-14 NOTE — Progress Notes (Signed)
Patient stated he was uncomfortable with the increase in lantus as well as the additional meal coverage. He refused medication. I texted Dr. Jomarie Longs and she was made aware and stated it was ok. Patient stated he is on a specific regimen at home and his blood sugars are very well controlled at home. He didn't want to change things unless his blood sugars are out of control. She was ok with this. Documented patient refused

## 2020-12-14 NOTE — Evaluation (Signed)
Clinical/Bedside Swallow Evaluation Patient Details  Name: James [name]. MRN: [id number] Date of Birth: 1960-08-17  Today's Date: 12/14/2020 Time: SLP Start Time (ACUTE ONLY): 1413 SLP Stop Time (ACUTE ONLY): 1426 SLP Time Calculation (min) (ACUTE ONLY): 13 min  Past Medical History:  Past Medical History:  Diagnosis Date   Cellulitis and abscess of foot 09/29/2016   Chronic systolic heart failure (HCC)    Cocaine abuse (HCC)    Cocaine dependence with cocaine-induced mood disorder (HCC)    Depression    Diabetes mellitus without complication (HCC)    Essential hypertension    NICM (nonischemic cardiomyopathy) (HCC) EF 20-25%    Suicidal ideations 07/12/2017   Past Surgical History:  Past Surgical History:  Procedure Laterality Date   CARDIAC CATHETERIZATION N/A 04/30/2016   Procedure: Right/Left Heart Cath and Coronary Angiography;  Surgeon: Yvonne Kendall, MD;  Location: [facility] INVASIVE CV LAB;  Service: Cardiovascular;  Laterality: N/A;   FINGER SURGERY     LEFT HEART CATH AND CORONARY ANGIOGRAPHY N/A 12/12/2020   Procedure: LEFT HEART CATH AND CORONARY ANGIOGRAPHY;  Surgeon: Lennette Bihari, MD;  Location: [facility] INVASIVE CV LAB;  Service: Cardiovascular;  Laterality: N/A;   HPI:  Pt is a 60 y/o male who presented 8/31 after cardiac arrest sustained at home. Pt recieved 4 rounds of epinephrine, multiple rounds of chest compressions, and x7 rounds of defibrillation prior to ROSC upon arrival to ED. ETT 8/31-9/1. 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   Assessment / Plan / Recommendation Clinical Impression  Pt was seen for bedside swallow evaluation and he denied a history of dysphagia. Oral mechanism exam was WNL and he presented with reduced dentition. He tolerated all solids and liquids without signs or symptoms of oropharyngeal dysphagia. A regular texture diet with thin liquids is recommended at this time and further  skilled SLP services are not clinically indicated for swallowing. SLP Visit Diagnosis: Dysphagia, unspecified (R13.10)    Aspiration Risk  No limitations    Diet Recommendation Regular;Thin liquid   Liquid Administration via: Cup;Straw Medication Administration: Whole meds with liquid Supervision: Patient able to self feed Postural Changes: Seated upright at 90 degrees    Other  Recommendations Oral Care Recommendations: Oral care BID   Follow up Recommendations None      Frequency and Duration            Prognosis        Swallow Study   General Date of Onset: 12/13/20 HPI: Pt is a 60 y/o male who presented 8/31 after cardiac arrest sustained at home. Pt recieved 4 rounds of epinephrine, multiple rounds of chest compressions, and x7 rounds of defibrillation prior to ROSC upon arrival to ED. ETT 8/31-9/1. 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 Type of Study: Bedside Swallow Evaluation Previous Swallow Assessment: none Diet Prior to this Study: Regular;Thin liquids Temperature Spikes Noted: No Respiratory Status: Room air History of Recent Intubation: Yes Length of Intubations (days): 1 days Date extubated: 12/11/20 Behavior/Cognition: Alert;Cooperative;Pleasant mood Oral Cavity Assessment: Within Functional Limits Oral Care Completed by SLP: No Oral Cavity - Dentition: Adequate natural dentition Vision: Functional for self-feeding Self-Feeding Abilities: Able to feed self Patient Positioning: Upright in bed Baseline Vocal Quality: Normal Volitional Cough: Weak Volitional Swallow: Able to elicit    Oral/Motor/Sensory Function Overall Oral Motor/Sensory Function: Within functional limits   Ice Chips Ice chips: Within  functional limits Presentation: Spoon   Thin Liquid Thin Liquid: Within functional limits Presentation: Straw    Nectar Thick Nectar Thick Liquid: Not tested   Honey Thick Honey Thick  Liquid: Not tested   Puree Puree: Within functional limits Presentation: Spoon   Solid     Solid: Within functional limits Presentation: Self Fed     Enrika Aguado I. Vear Clock, MS, CCC-SLP Acute Rehabilitation Services Office number [phone] Pager [phone]  Scheryl Marten 12/14/2020,2:25 PM

## 2020-12-14 NOTE — Progress Notes (Signed)
Inpatient Rehab Admissions Coordinator:   I am following for potential CIR admission; however, Pt. Is already relatively high level and will need to have AICD placed before he can come.  I will follow and see how he progresses and pursue for admit if Pt. Continues to demonstrate deficits following procedure.   Megan Salon, MS, CCC-SLP Rehab Admissions Coordinator  [phone] (celll) [phone] (office)

## 2020-12-14 NOTE — Progress Notes (Addendum)
Progress Note  Patient Name: James [name]. Date of Encounter: 12/14/2020  [facility] Cardiologist: Olga Millers, MD   Patient Profile     60 y.o. male 60 y.o. male with a hx of NICM (20-25%, recovered to 55%-cath only noted 50% LAD), cocaine abuse (negative UDS screen with current admission), HTN, DM2, mild to moderate MR who is being seen 12/11/2020 for the evaluation of Vfib arrest at the request of Dr. Chestine Spore (PCCM)  He presented to ER 12/10/2020 with V. fib/V. tach arrest.  Was resting in his chair, and "dozed off".  Was noted to be moaning and foaming at the mouth.  EMS was contacted but CPR not started until arrival.  Reportedly 7 defibrillation shocks 3 total of 450 mg IV amiodarone and 4 rounds of epinephrine restoring ROSC.  Extubated 12/11/2020-but remains confused.  Was found on echocardiogram to have moderate reduced EF of 40 to 45%.  Assessment & Plan    1.  VT/VF cardiac arrest: Presumed due to a shockable rhythm.  He had 7 defibrillations, 450 mg of amiodarone, and 4 rounds of epinephrine.  No obvious coronary artery disease to explain his arrhythmias.  We Clotile Whittington continue to increase his heart failure medications.  As we do not have any explanation thus far for his arrhythmias, we Murl Golladay plan cardiac MRI on Tuesday.  Pending MRI, likely ICD thereafter.  He is currently not on any antiarrhythmics.  Holding amiodarone due to elevated LFTs.  No further ventricular arrhythmias have occurred.  2.  Chronic systolic heart failure: Ejection fraction 40 to 45%.  No obvious volume overload.  Erienne Spelman increase carvedilol to 12.5 mg.  3.  Cardiogenic shock: No further issues.  Is weaned off of epinephrine 12/11/2020.  4.  Cocaine abuse: Has been abstinent.  Tox screen on admission and negative for cocaine.  5.  Type 2 diabetes: Hemoglobin A1c of 10.7.  Annarae Macnair need SGLT2 inhibitor at discharge.  Plan per primary team.  6.  Elevated transaminases: Found in the setting of cardiac arrest.   Slowly recovering.  Subjective   Currently doing well.  Continued chest pain due to CPR.  No anginal symptoms.  Continues to have issues with memory.  Inpatient Medications    Scheduled Meds:  aspirin  81 mg Oral Daily   atorvastatin  80 mg Oral Daily   carvedilol  6.25 mg Oral BID WC   Chlorhexidine Gluconate Cloth  6 each Topical Daily   insulin aspart  0-15 Units Subcutaneous TID WC   insulin aspart  0-5 Units Subcutaneous QHS   insulin glargine-yfgn  12 Units Subcutaneous Daily   lidocaine  1 patch Transdermal Q24H   mouth rinse  15 mL Mouth Rinse BID   pantoprazole  40 mg Oral Daily   polyethylene glycol  17 g Oral Daily   sodium chloride flush  3 mL Intravenous Q12H   Continuous Infusions:  sodium chloride 10 mL/hr at 12/12/20 1000   sodium chloride     cefTRIAXone (ROCEPHIN)  IV Stopped (12/13/20 1636)   PRN Meds: sodium chloride, acetaminophen, docusate, ketorolac, ondansetron (ZOFRAN) IV, oxyCODONE-acetaminophen, polyethylene glycol, sodium chloride flush, traMADol   Vital Signs    Vitals:   12/13/20 1715 12/13/20 2200 12/14/20 0006 12/14/20 0505  BP: 120/86 (!) 152/87 136/75 137/83  Pulse: 92  87 89  Resp: 20 18 16 17   Temp: 97.7 F (36.5 C) 97.8 F (36.6 C) 97.8 F (36.6 C) 97.8 F (36.6 C)  TempSrc: Axillary Oral Oral Oral  SpO2: 96%  96% 98%  Weight:    72.3 kg  Height:        Intake/Output Summary (Last 24 hours) at 12/14/2020 0824 Last data filed at 12/14/2020 0711 Gross per 24 hour  Intake 1300 ml  Output 850 ml  Net 450 ml    Last 3 Weights 12/14/2020 12/12/2020 12/11/2020  Weight (lbs) 159 lb 4.8 oz 152 lb 8.9 oz 156 lb 8.4 oz  Weight (kg) 72.258 kg 69.2 kg 71 kg  Some encounter information is confidential and restricted. Go to Review Flowsheets activity to see all data.      Telemetry    Sinus rhythm with PVCs-personally reviewed  ECG    None new  Physical Exam   GEN: Well nourished, well developed, in no acute distress  HEENT: normal   Neck: no JVD, carotid bruits, or masses Cardiac: RRR; no murmurs, rubs, or gallops,no edema  Respiratory:  clear to auscultation bilaterally, normal work of breathing GI: soft, nontender, nondistended, + BS MS: no deformity or atrophy  Skin: warm and dry Neuro:  Strength and sensation are intact Psych: euthymic mood, full affect   Labs    High Sensitivity Troponin:   Recent Labs  Lab 12/10/20 2145 12/11/20 0029 12/11/20 0940 12/11/20 1142  TROPONINIHS 74* 465* 2,930* 2,837*       Chemistry Recent Labs  Lab 12/12/20 0029 12/13/20 0105 12/14/20 0126  NA 137 136 134*  K 3.9 4.3 4.2  CL 107 104 104  CO2 22 24 24   GLUCOSE 114* 182* 376*  BUN 18 18 16   CREATININE 1.12 1.03 1.03  CALCIUM 8.4* 8.4* 8.2*  PROT 5.9* 5.7* 5.3*  ALBUMIN 3.4* 3.1* 2.7*  AST 444* 288* 230*  ALT 445* 303* 210*  ALKPHOS 79 69 58  BILITOT 0.8 0.4 0.6  GFRNONAA >60 >60 >60  ANIONGAP 8 8 6       Hematology Recent Labs  Lab 12/12/20 0029 12/13/20 0105 12/14/20 0126  WBC 8.7 6.5 4.4  RBC 4.66 4.90 4.21*  HGB 12.9* 13.7 11.7*  HCT 40.2 41.8 35.9*  MCV 86.3 85.3 85.3  MCH 27.7 28.0 27.8  MCHC 32.1 32.8 32.6  RDW 15.5 15.6* 15.5  PLT 169 184 165     BNPNo results for input(s): BNP, PROBNP in the last 168 hours.   DDimer No results for input(s): DDIMER in the last 168 hours.    Cardiac Studies - Summary   Right and Left Heart Cath 04/2016: Moderate mid LAD 50% stenosis otherwise minimal CAD consistent with nonischemic cardiomyopathy.  Normal right heart pressures with mildly elevated left-sided filling pressures.  No pulmonary hypertension.. 2D Echo 12/11/2020: EF 40 to 45% global HK.  Normal diastolic parameters.  Normal RV size and function.  Normal valves.  Radiology    CARDIAC CATHETERIZATION  Addendum Date: 12/12/2020     Mid LAD lesion is 50% stenosed. Mild to moderate nonobstructive CAD with calcification of the mid LAD with previously noted 50% stenosis not significantly  changed.  Normal intermediate, circumflex, and RCA. LVEDP 11 mmHg. RECOMMENDATION: Medical therapy.   Result Date: 12/12/2020   Mid LAD lesion is 50% stenosed. Mild to moderate nonobstructive CAD with calcification of the mid LAD with previously noted 50% stenosis not significantly changed.  Normal intermediate, circumflex, and RCA. LVEDP 11 mmHg. RECOMMENDATION: Medical therapy.  Yes this is Dr. 02/10/2021 is a 12 this is Dr. 02/11/2021 that Tresa Endo with Mr. 64 okay okay       For questions or  updates, please contact [facility] Please consult [email or web address] for contact info under        Signed, Langston Summerfield Jorja Loa, MD  12/14/2020, 8:24 AM

## 2020-12-14 NOTE — Progress Notes (Signed)
PROGRESS NOTE    James [name].  GYJ:[id number] DOB: [birth year] DOA: 12/10/2020 PCP: Storm Frisk, MD  Brief Narrative:60 y/o gentleman with a history of NICM with recovered EF, HTN, DM who presented post-cardiac arrest. He was watching TV with his daughter and dozed off. He began foaming at the mouth and she called 911 and initiated resuscitation. EMS arrived in 3 minutes and he was in VT/VF. He received defibrillations amiodarone 450mg  total, and epinephrine x 4 doses. He was intubated in the field and had achieved ROSC upon arrival to the ED. He had not complained of new or unusual symptoms leading up to this. 8/31 admitted s/p card arrest; intubated  9/1: extubated; weaned off levo 9/2: heart cath neg, EP consult  Assessment & Plan:   VT/V. fib arrest -Treated with multiple rounds of defibrillations, amiodarone and 4 rounds of epi -Weaned off Levophed on 9/1 -Echo with EF of 40-45%, left heart cath noted nonobstructive CAD, presumed nonischemic, known history of NICM, EF of 20-25% recovered,  -UDS negative -Cards, EP following -Started on carvedilol, dose increased -Plan for cardiac MRI on Tuesday followed by possible ICD implantation thereafter  Cardiogenic shock -Following V. fib arrest, weaned off epinephrine at 9/1  Acute on chronic systolic CHF -See discussion above, echo with EF of 40-45%, cath with nonobstructive CAD -Clinically euvolemic, continue carvedilol  Type 2 diabetes mellitus -CBGs poorly controlled, increase glargine, add NovoLog with meals  Post arrest encephalopathy -Improving  AKI -Resolved  Shock liver -Improving  Aspiration pneumonitis -Improving, to complete 5-day course of Rocephin  Multiple rib fractures -Secondary to CPR, continue incentive spirometry  Right renal lesion -Incidentally noted on imaging, needs follow-up  Cholelithiasis, ?  Cholecystitis -Right upper quadrant ultrasound unremarkable, no signs or symptoms of  cholecystitis -Monitor  DVT prophylaxis: SCDs, add Lovenox tomorrow if no concern for bleeding from multiple rib fractures Code Status: Full code Family Communication: Discussed patient in detail, no family at bedside Disposition Plan:  Status is: Inpatient  Remains inpatient appropriate because:Inpatient level of care appropriate due to severity of illness  Dispo: The patient is from: Home              Anticipated d/c is to:  ? CIR              Patient currently is not medically stable to d/c.   Difficult to place patient No  Consultants:  Cards, EP PCCM transfer  Procedures: Right and Left Heart Cath 04/2016: Moderate mid LAD 50% stenosis otherwise minimal CAD consistent with nonischemic cardiomyopathy.  Normal right heart pressures with mildly elevated left-sided filling pressures.  No pulmonary hypertension.. 2D Echo 12/11/2020: EF 40 to 45% global HK.  Normal diastolic parameters.  Normal RV size and function.  Normal valves.  Antimicrobials:    Subjective: -Feels well, denies any complaints or symptoms  Objective: Vitals:   12/13/20 2200 12/14/20 0006 12/14/20 0505 12/14/20 0800  BP: (!) 152/87 136/75 137/83 (!) 159/96  Pulse:  87 89   Resp: 18 16 17 18   Temp: 97.8 F (36.6 C) 97.8 F (36.6 C) 97.8 F (36.6 C) 97.8 F (36.6 C)  TempSrc: Oral Oral Oral   SpO2:  96% 98% 96%  Weight:   72.3 kg   Height:        Intake/Output Summary (Last 24 hours) at 12/14/2020 1058 Last data filed at 12/14/2020 0711 Gross per 24 hour  Intake 1300 ml  Output 850 ml  Net 450 ml  Filed Weights   12/11/20 0100 12/12/20 0700 12/14/20 0505  Weight: 71 kg 69.2 kg 72.3 kg    Examination:  General exam: Appears calm and comfortable, AAOx3, no distress Respiratory system: Clear to auscultation Cardiovascular system: S1 & S2 heard, RRR.  Abd: nondistended, soft and nontender.Normal bowel sounds heard. Central nervous system: Alert and oriented. No focal neurological  deficits. Extremities: no edema Skin: No rashes Psychiatry:  Mood & affect appropriate.     Data Reviewed:   CBC: Recent Labs  Lab 12/10/20 2145 12/10/20 2154 12/11/20 0151 12/11/20 0458 12/12/20 0029 12/13/20 0105 12/14/20 0126  WBC 6.6  --  11.3*  --  8.7 6.5 4.4  NEUTROABS 1.9  --   --   --   --   --   --   HGB 13.4   < > 14.2 13.6 12.9* 13.7 11.7*  HCT 44.3   < > 44.2 40.0 40.2 41.8 35.9*  MCV 92.7  --  87.9  --  86.3 85.3 85.3  PLT 212  --  187  --  169 184 165   < > = values in this interval not displayed.   Basic Metabolic Panel: Recent Labs  Lab 12/10/20 2145 12/10/20 2154 12/10/20 2313 12/11/20 0151 12/11/20 0458 12/12/20 0029 12/13/20 0105 12/14/20 0126  NA 139 139   < > 136 140 137 136 134*  K 4.4 4.2   < > 5.0 3.9 3.9 4.3 4.2  CL 106 105  --  107  --  107 104 104  CO2 17*  --   --  18*  --  22 24 24   GLUCOSE 317* 298*  --  290*  --  114* 182* 376*  BUN 20 23  --  21  --  18 18 16   CREATININE 1.44* 1.30*  --  1.20  --  1.12 1.03 1.03  CALCIUM 8.3*  --   --  8.2*  --  8.4* 8.4* 8.2*  MG 2.1  --   --  2.0  --  2.1 2.2 2.0  PHOS  --   --   --  4.3  --   --   --  3.0   < > = values in this interval not displayed.   GFR: Estimated Creatinine Clearance: 76 mL/min (by C-G formula based on SCr of 1.03 mg/dL). Liver Function Tests: Recent Labs  Lab 12/10/20 2145 12/12/20 0029 12/13/20 0105 12/14/20 0126  AST 579* 444* 288* 230*  ALT 684* 445* 303* 210*  ALKPHOS 116 79 69 58  BILITOT 0.9 0.8 0.4 0.6  PROT 5.6* 5.9* 5.7* 5.3*  ALBUMIN 3.2* 3.4* 3.1* 2.7*   No results for input(s): LIPASE, AMYLASE in the last 168 hours. No results for input(s): AMMONIA in the last 168 hours. Coagulation Profile: No results for input(s): INR, PROTIME in the last 168 hours. Cardiac Enzymes: No results for input(s): CKTOTAL, CKMB, CKMBINDEX, TROPONINI in the last 168 hours. BNP (last 3 results) No results for input(s): PROBNP in the last 8760 hours. HbA1C: No  results for input(s): HGBA1C in the last 72 hours. CBG: Recent Labs  Lab 12/13/20 0755 12/13/20 1129 12/13/20 1604 12/13/20 2117 12/14/20 0835  GLUCAP 84 178* 234* 214* 363*   Lipid Profile: Recent Labs    12/12/20 0029  CHOL 112  HDL 75  LDLCALC 30  TRIG 33  CHOLHDL 1.5   Thyroid Function Tests: No results for input(s): TSH, T4TOTAL, FREET4, T3FREE, THYROIDAB in the last 72 hours. Anemia Panel:  No results for input(s): VITAMINB12, FOLATE, FERRITIN, TIBC, IRON, RETICCTPCT in the last 72 hours. Urine analysis:    Component Value Date/Time   COLORURINE YELLOW 12/11/2020 0009   APPEARANCEUR HAZY (A) 12/11/2020 0009   LABSPEC 1.025 12/11/2020 0009   PHURINE 5.0 12/11/2020 0009   GLUCOSEU >=500 (A) 12/11/2020 0009   HGBUR LARGE (A) 12/11/2020 0009   HGBUR negative 08/06/2008 1124   BILIRUBINUR NEGATIVE 12/11/2020 0009   KETONESUR NEGATIVE 12/11/2020 0009   PROTEINUR 100 (A) 12/11/2020 0009   UROBILINOGEN 1.0 12/06/2012 1637   NITRITE NEGATIVE 12/11/2020 0009   LEUKOCYTESUR NEGATIVE 12/11/2020 0009   Sepsis Labs: @LABRCNTIP (procalcitonin:4,lacticidven:4)  ) Recent Results (from the past 240 hour(s))  Resp Panel by RT-PCR (Flu A&B, Covid) Nasopharyngeal Swab     Status: None   Collection Time: 12/10/20  9:46 PM   Specimen: Nasopharyngeal Swab; Nasopharyngeal(NP) swabs in vial transport medium  Result Value Ref Range Status   SARS Coronavirus 2 by RT PCR NEGATIVE NEGATIVE Final    Comment: (NOTE) SARS-CoV-2 target nucleic acids are NOT DETECTED.  The SARS-CoV-2 RNA is generally detectable in upper respiratory specimens during the acute phase of infection. The lowest concentration of SARS-CoV-2 viral copies this assay can detect is 138 copies/mL. A negative result does not preclude SARS-Cov-2 infection and should not be used as the sole basis for treatment or other patient management decisions. A negative result may occur with  improper specimen  collection/handling, submission of specimen other than nasopharyngeal swab, presence of viral mutation(s) within the areas targeted by this assay, and inadequate number of viral copies(<138 copies/mL). A negative result must be combined with clinical observations, patient history, and epidemiological information. The expected result is Negative.  Fact Sheet for Patients:  12/12/20  Fact Sheet for Healthcare Providers:  BloggerCourse.com  This test is no t yet approved or cleared by the [employer]iousBroker.it FDA and  has been authorized for detection and/or diagnosis of SARS-CoV-2 by FDA under an Emergency Use Authorization (EUA). This EUA will remain  in effect (meaning this test can be used) for the duration of the COVID-19 declaration under Section 564(b)(1) of the Act, 21 U.S.C.section 360bbb-3(b)(1), unless the authorization is terminated  or revoked sooner.       Influenza A by PCR NEGATIVE NEGATIVE Final   Influenza B by PCR NEGATIVE NEGATIVE Final    Comment: (NOTE) The Xpert Xpress SARS-CoV-2/FLU/RSV plus assay is intended as an aid in the diagnosis of influenza from Nasopharyngeal swab specimens and should not be used as a sole basis for treatment. Nasal washings and aspirates are unacceptable for Xpert Xpress SARS-CoV-2/FLU/RSV testing.  Fact Sheet for Patients: [place]  Fact Sheet for Healthcare Providers: BloggerCourse.com  This test is not yet approved or cleared by the SeriousBroker.it FDA and has been authorized for detection and/or diagnosis of SARS-CoV-2 by FDA under an Emergency Use Authorization (EUA). This EUA will remain in effect (meaning this test can be used) for the duration of the COVID-19 declaration under Section 564(b)(1) of the Act, 21 U.S.C. section 360bbb-3(b)(1), unless the authorization is terminated or revoked.  Performed at [facility], [address], [place], [place]   MRSA Next Gen by PCR, Nasal     Status: None   Collection Time: 12/11/20 12:52 AM   Specimen: Nasal Mucosa; Nasal Swab  Result Value Ref Range Status   MRSA by PCR Next Gen NOT DETECTED NOT DETECTED Final    Comment: (NOTE) The GeneXpert MRSA Assay (  FDA approved for NASAL specimens only), is one component of a comprehensive MRSA colonization surveillance program. It is not intended to diagnose MRSA infection nor to guide or monitor treatment for MRSA infections. Test performance is not FDA approved in patients less than 37 years old. Performed at [facility], [address], [place], [place] [address]   Culture, blood (routine x 2)     Status: None (Preliminary result)   Collection Time: 12/11/20  1:51 AM   Specimen: BLOOD RIGHT HAND  Result Value Ref Range Status   Specimen Description BLOOD RIGHT HAND  Final   Special Requests AEROBIC BOTTLE ONLY Blood Culture adequate volume  Final   Culture   Final    NO GROWTH 3 DAYS Performed at [facility], [address] [place], [place] [address]    Report Status PENDING  Incomplete  Culture, blood (routine x 2)     Status: None (Preliminary result)   Collection Time: 12/11/20  1:51 AM   Specimen: BLOOD LEFT HAND  Result Value Ref Range Status   Specimen Description BLOOD LEFT HAND  Final   Special Requests AEROBIC BOTTLE ONLY Blood Culture adequate volume  Final   Culture   Final    NO GROWTH 3 DAYS Performed at [facility], [address] [place], [place] [address]    Report Status PENDING  Incomplete      Scheduled Meds:  aspirin  81 mg Oral Daily   atorvastatin  80 mg Oral Daily   carvedilol  12.5 mg Oral BID WC   Chlorhexidine Gluconate Cloth  6 each Topical Daily   insulin aspart  0-15 Units Subcutaneous TID WC   insulin aspart  0-5 Units Subcutaneous QHS   insulin glargine-yfgn  12 Units Subcutaneous Daily   lidocaine  1 patch  Transdermal Q24H   mouth rinse  15 mL Mouth Rinse BID   pantoprazole  40 mg Oral Daily   polyethylene glycol  17 g Oral Daily   sodium chloride flush  3 mL Intravenous Q12H   Continuous Infusions:  sodium chloride 10 mL/hr at 12/12/20 1000   sodium chloride     cefTRIAXone (ROCEPHIN)  IV Stopped (12/13/20 1636)     LOS: 3 days    Time spent:  Zannie Cove, MD [facility]   12/14/2020, 10:58 AM

## 2020-12-15 DIAGNOSIS — K819 Cholecystitis, unspecified: Secondary | ICD-10-CM

## 2020-12-15 DIAGNOSIS — R570 Cardiogenic shock: Secondary | ICD-10-CM

## 2020-12-15 DIAGNOSIS — I5022 Chronic systolic (congestive) heart failure: Secondary | ICD-10-CM

## 2020-12-15 LAB — CBC
HCT: 35.6 % — ABNORMAL LOW (ref 39.0–52.0)
Hemoglobin: 11.9 g/dL — ABNORMAL LOW (ref 13.0–17.0)
MCH: 28.4 pg (ref 26.0–34.0)
MCHC: 33.4 g/dL (ref 30.0–36.0)
MCV: 85 fL (ref 80.0–100.0)
Platelets: 188 10*3/uL (ref 150–400)
RBC: 4.19 MIL/uL — ABNORMAL LOW (ref 4.22–5.81)
RDW: 15.3 % (ref 11.5–15.5)
WBC: 4.1 10*3/uL (ref 4.0–10.5)
nRBC: 0 % (ref 0.0–0.2)

## 2020-12-15 LAB — COMPREHENSIVE METABOLIC PANEL
ALT: 166 U/L — ABNORMAL HIGH (ref 0–44)
AST: 196 U/L — ABNORMAL HIGH (ref 15–41)
Albumin: 2.5 g/dL — ABNORMAL LOW (ref 3.5–5.0)
Alkaline Phosphatase: 56 U/L (ref 38–126)
Anion gap: 6 (ref 5–15)
BUN: 14 mg/dL (ref 8–23)
CO2: 27 mmol/L (ref 22–32)
Calcium: 8.7 mg/dL — ABNORMAL LOW (ref 8.9–10.3)
Chloride: 105 mmol/L (ref 98–111)
Creatinine, Ser: 0.86 mg/dL (ref 0.61–1.24)
GFR, Estimated: >60 mL/min (ref >60)
Glucose, Bld: 79 mg/dL (ref 70–99)
Potassium: 3.8 mmol/L (ref 3.5–5.1)
Sodium: 138 mmol/L (ref 135–145)
Total Bilirubin: 0.4 mg/dL (ref 0.3–1.2)
Total Protein: 5.2 g/dL — ABNORMAL LOW (ref 6.5–8.1)

## 2020-12-15 LAB — GLUCOSE, CAPILLARY
Glucose-Capillary: 190 mg/dL — ABNORMAL HIGH (ref 70–99)
Glucose-Capillary: 210 mg/dL — ABNORMAL HIGH (ref 70–99)
Glucose-Capillary: 174 mg/dL — ABNORMAL HIGH (ref 70–99)
Glucose-Capillary: 139 mg/dL — ABNORMAL HIGH (ref 70–99)

## 2020-12-15 LAB — HEPATITIS PANEL, ACUTE
HCV Ab: NONREACTIVE
Hep A IgM: NONREACTIVE
Hep B C IgM: NONREACTIVE
Hepatitis B Surface Ag: NONREACTIVE

## 2020-12-15 MED ORDER — POTASSIUM CHLORIDE CRYS ER 20 MEQ PO TBCR
40.0000 meq | EXTENDED_RELEASE_TABLET | Freq: Once | ORAL | Status: AC
  Administered 2020-12-15: 40 meq via ORAL
  Filled 2020-12-15: qty 2

## 2020-12-15 MED ORDER — SACUBITRIL-VALSARTAN 24-26 MG PO TABS
1.0000 | ORAL_TABLET | Freq: Two times a day (BID) | ORAL | Status: DC
  Administered 2020-12-15 – 2020-12-17 (×5): 1 via ORAL
  Filled 2020-12-15 (×5): qty 1

## 2020-12-15 NOTE — Progress Notes (Signed)
Physical Therapy Treatment Patient Details Name: James [name]. MRN: [id number] DOB: 05/10/1960 Today's Date: 12/15/2020    History of Present Illness 60 yo male presenting 8/31 after cardiac arrest sustained at home. Pt recieved 4 rounds of epinephrine, multiple rounds of chest compressions, and x7 rounds of defibrillation prior to ROSC upon arrival to ED. 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    PT Comments    Pt has displayed significant improvement in balance, functional mobility safety and independence, and cognition. He is now A&Ox4, able to path find his way back to his room, and recall plans for ICD tomorrow. Pt is able to perform all gait and balance challenges, including SLS without UE support, changing gait speeds, changing directions suddenly, quick turns, changing head positions, negotiating around obstacles, sudden stops, walking laterally while braiding feet, walking tandem steps forward and backward, and even skipping without LOB or assistance. Thus, changed d/c recs to no PT follow-up as he appears to be approaching his baseline. Will plan to continue to follow acutely to ensure consistent improvements and to educate pt on precautions/re-assess pt following planned ICD tomorrow.   Follow Up Recommendations  No PT follow up;Supervision - Intermittent     Equipment Recommendations  None recommended by PT    Recommendations for Other Services       Precautions / Restrictions Precautions Precautions: Other (comment) Precaution Comments: monitor HR Restrictions Weight Bearing Restrictions: No    Mobility  Bed Mobility Overal bed mobility: Modified Independent Bed Mobility: Supine to Sit     Supine to sit: Modified independent (Device/Increase time)     General bed mobility comments: Pt transition supine > sit with mod I HOB elevated without difficulty.    Transfers Overall transfer level:  Independent Equipment used: None Transfers: Sit to/from Stand Sit to Stand: Independent         General transfer comment: No LOB or unsteadiness noted with transferring to stand from EOB.  Ambulation/Gait Ambulation/Gait assistance: Min guard;Supervision Gait Distance (Feet): 450 Feet Assistive device: None Gait Pattern/deviations: WFL(Within Functional Limits) Gait velocity: decreased Gait velocity interpretation: >4.37 ft/sec, indicative of normal walking speed (when cued to increase speed) General Gait Details: Pt with no LOB with changes in gait speed, quick turns, changes in head position, negotiating obstacles, or skipping down the hall. Able to walk tandem forwards and back and braid lateral steps without LOB, min guard-supervision for safety but could do independently.   Stairs             Wheelchair Mobility    Modified Rankin (Stroke Patients Only)       Balance Overall balance assessment: Needs assistance Sitting-balance support: No upper extremity supported;Feet supported Sitting balance-Leahy Scale: Normal     Standing balance support: No upper extremity supported Standing balance-Leahy Scale: Normal Standing balance comment: Able to perform gait challenges and stand in SLS either leg statically without UE support with eyes open for > 10 sec without LOB or assistance. Single Leg Stance - Right Leg: 10 (seconds, no UE support, eyes open, no LOB) Single Leg Stance - Left Leg: 10 (seconds, no UE support, eyes open, no LOB)         High level balance activites: Braiding;Backward walking;Direction changes;Turns;Sudden stops;Head turns;Other (comment) (tandem walking forwards and backwards) High Level Balance Comments: Able to change gait speeds, change directions suddenly, turn quickly, stop suddenly, change head positions, skip, braid step laterally, and walk tandem forwards  and backwards all without LOB or assistance.            Cognition  Arousal/Alertness: Awake/alert Behavior During Therapy: WFL for tasks assessed/performed Overall Cognitive Status: Within Functional Limits for tasks assessed                                 General Comments: Pt is A&Ox4 and is able to remember that he has plans to for ICD tomorrow. Pt is able to pathfind his way back to his room and follow multi-step commands. He appears to be aware of his safety.      Exercises      General Comments General comments (skin integrity, edema, etc.): HR up to 100s, upon entry reading irregular heart beat with PVC every 4th beat, when skipping short distance down hall it changed to stating Ventricular Bigeminy and displayed PVC every other beat, thus ceased skipping, paused to rest and returned to room to end session, notified RN      Pertinent Vitals/Pain Pain Assessment: Faces Faces Pain Scale: Hurts a little bit Pain Location: ribs Pain Descriptors / Indicators: Sore Pain Intervention(s): Limited activity within patient's tolerance;Monitored during session;Repositioned    Home Living                      Prior Function            PT Goals (current goals can now be found in the care plan section) Acute Rehab PT Goals Patient Stated Goal: return home and to independence PT Goal Formulation: With patient Time For Goal Achievement: 12/26/20 Potential to Achieve Goals: Good Progress towards PT goals: Progressing toward goals    Frequency    Min 3X/week      PT Plan Discharge plan needs to be updated;Frequency needs to be updated;Equipment recommendations need to be updated    Co-evaluation              AM-PAC PT "6 Clicks" Mobility   Outcome Measure  Help needed turning from your back to your side while in a flat bed without using bedrails?: None Help needed moving from lying on your back to sitting on the side of a flat bed without using bedrails?: None Help needed moving to and from a bed to a chair  (including a wheelchair)?: None Help needed standing up from a chair using your arms (e.g., wheelchair or bedside chair)?: None Help needed to walk in hospital room?: A Little Help needed climbing 3-5 steps with a railing? : A Little 6 Click Score: 22    End of Session Equipment Utilized During Treatment: Gait belt Activity Tolerance: Patient tolerated treatment well Patient left: in bed;with call bell/phone within reach Nurse Communication: Mobility status;Other (comment) (HR and beat displays) PT Visit Diagnosis: Other (comment);Difficulty in walking, not elsewhere classified (R26.2) (cardio deficits)     Time: 0814-4818 PT Time Calculation (min) (ACUTE ONLY): 12 min  Charges:  $Neuromuscular Re-education: 8-22 mins                     Raymond Gurney, PT, DPT Acute Rehabilitation Services  Pager: [phone] Office: [phone]    Jewel Baize 12/15/2020, 3:02 PM

## 2020-12-15 NOTE — Progress Notes (Signed)
PROGRESS NOTE    James [name].  GUR:[id number] DOB: 1961/03/13 DOA: 12/10/2020 PCP: Storm Frisk, MD  Brief Narrative:60 y/o gentleman with a history of NICM with recovered EF, HTN, DM who presented post-cardiac arrest. He was watching TV with his daughter and dozed off. He began foaming at the mouth and she called 911 and initiated resuscitation. EMS arrived in 3 minutes and he was in VT/VF. He received defibrillations amiodarone 450mg  total, and epinephrine x 4 doses. He was intubated in the field and had achieved ROSC upon arrival to the ED. He had not complained of new or unusual symptoms leading up to this. 8/31 admitted s/p card arrest; intubated  9/1: extubated; weaned off levo 9/2: heart cath neg, EP consulted, cardiac MRI and ICD recommended 9/4, cardiac MRI suggests nonischemic cardiomyopathy, no infiltrative disease noted  Assessment & Plan:   VT/V. fib arrest -Treated with multiple rounds of defibrillations, amiodarone and 4 rounds of epi -Weaned off Levophed on 9/1 -Echo with EF of 40-45%, left heart cath noted nonobstructive CAD, presumed nonischemic, known history of NICM, EF of 20-25% recovered,  -UDS negative -Cards, EP following -Started on carvedilol, dose increased -Cardiac MRI completed yesterday, suggest EF of 32%, nonischemic cardiomyopathy suspected, no infiltrative diseases noted -Plan for ICD tomorrow  Cardiogenic shock -Following V. fib arrest, weaned off epinephrine at 9/1  Rib fractures, following CPR -Incentive spirometry, supportive care  Acute on chronic systolic CHF -See discussion above, echo with EF of 40-45%, cath with nonobstructive CAD -Clinically euvolemic, continue carvedilol  Type 2 diabetes mellitus -CBGs poorly controlled, increase glargine, add NovoLog with meals  Post arrest encephalopathy -Improving  AKI -Resolved  Shock liver Abnormal LFTs -Improving, bilirubin is normal, no fever or leukocytosis -Clinical  suspicion for cholecystitis is low, cardiology ordered HIDA scan given need for ICD  Aspiration pneumonitis -Improving, to complete 5-day course of Rocephin  Multiple rib fractures -Secondary to CPR, continue incentive spirometry  Right renal lesion -Incidentally noted on imaging, needs follow-up  DVT prophylaxis: SCDs, add Lovenox tomorrow if no concern for bleeding from multiple rib fractures Code Status: Full code Family Communication: Discussed patient in detail, no family at bedside Disposition Plan:  Status is: Inpatient  Remains inpatient appropriate because:Inpatient level of care appropriate due to severity of illness  Dispo: The patient is from: Home              Anticipated d/c is to:  ? CIR              Patient currently is not medically stable to d/c.   Difficult to place patient No  Consultants:  Cards, EP PCCM transfer  Procedures: Right and Left Heart Cath 04/2016: Moderate mid LAD 50% stenosis otherwise minimal CAD consistent with nonischemic cardiomyopathy.  Normal right heart pressures with mildly elevated left-sided filling pressures.  No pulmonary hypertension.. 2D Echo 12/11/2020: EF 40 to 45% global HK.  Normal diastolic parameters.  Normal RV size and function.  Normal valves.  Antimicrobials:    Subjective: -Feels okay overall, has some tenderness side of the rib fractures  Objective: Vitals:   12/14/20 0800 12/14/20 1738 12/15/20 0358 12/15/20 0814  BP: (!) 159/96 (!) 156/81 (!) 147/89   Pulse:  88 88 94  Resp: 18  20 18   Temp: 97.8 F (36.6 C)  97.8 F (36.6 C) 98.5 F (36.9 C)  TempSrc:   Oral Oral  SpO2: 96%  95% 95%  Weight:   72.6 kg   Height:  Intake/Output Summary (Last 24 hours) at 12/15/2020 1235 Last data filed at 12/15/2020 0800 Gross per 24 hour  Intake 940 ml  Output --  Net 940 ml   Filed Weights   12/12/20 0700 12/14/20 0505 12/15/20 0358  Weight: 69.2 kg 72.3 kg 72.6 kg    Examination:  General exam: Awake  alert oriented x3, no distress CVS: S1-S2, regular rate rhythm Lungs: Clear bilaterally Abdomen: Soft, nontender, bowel sounds present Extremities: No edema Skin: No rashes Psychiatry:  Mood & affect appropriate.     Data Reviewed:   CBC: Recent Labs  Lab 12/10/20 2145 12/10/20 2154 12/11/20 0151 12/11/20 0458 12/12/20 0029 12/13/20 0105 12/14/20 0126 12/15/20 0051  WBC 6.6  --  11.3*  --  8.7 6.5 4.4 4.1  NEUTROABS 1.9  --   --   --   --   --   --   --   HGB 13.4   < > 14.2 13.6 12.9* 13.7 11.7* 11.9*  HCT 44.3   < > 44.2 40.0 40.2 41.8 35.9* 35.6*  MCV 92.7  --  87.9  --  86.3 85.3 85.3 85.0  PLT 212  --  187  --  169 184 165 188   < > = values in this interval not displayed.   Basic Metabolic Panel: Recent Labs  Lab 12/10/20 2145 12/10/20 2154 12/11/20 0151 12/11/20 0458 12/12/20 0029 12/13/20 0105 12/14/20 0126 12/15/20 0051  NA 139   < > 136 140 137 136 134* 138  K 4.4   < > 5.0 3.9 3.9 4.3 4.2 3.8  CL 106   < > 107  --  107 104 104 105  CO2 17*  --  18*  --  22 24 24 27   GLUCOSE 317*   < > 290*  --  114* 182* 376* 79  BUN 20   < > 21  --  18 18 16 14   CREATININE 1.44*   < > 1.20  --  1.12 1.03 1.03 0.86  CALCIUM 8.3*  --  8.2*  --  8.4* 8.4* 8.2* 8.7*  MG 2.1  --  2.0  --  2.1 2.2 2.0  --   PHOS  --   --  4.3  --   --   --  3.0  --    < > = values in this interval not displayed.   GFR: Estimated Creatinine Clearance: 91.5 mL/min (by C-G formula based on SCr of 0.86 mg/dL). Liver Function Tests: Recent Labs  Lab 12/10/20 2145 12/12/20 0029 12/13/20 0105 12/14/20 0126 12/15/20 0051  AST 579* 444* 288* 230* 196*  ALT 684* 445* 303* 210* 166*  ALKPHOS 116 79 69 58 56  BILITOT 0.9 0.8 0.4 0.6 0.4  PROT 5.6* 5.9* 5.7* 5.3* 5.2*  ALBUMIN 3.2* 3.4* 3.1* 2.7* 2.5*   No results for input(s): LIPASE, AMYLASE in the last 168 hours. No results for input(s): AMMONIA in the last 168 hours. Coagulation Profile: No results for input(s): INR, PROTIME in  the last 168 hours. Cardiac Enzymes: No results for input(s): CKTOTAL, CKMB, CKMBINDEX, TROPONINI in the last 168 hours. BNP (last 3 results) No results for input(s): PROBNP in the last 8760 hours. HbA1C: No results for input(s): HGBA1C in the last 72 hours. CBG: Recent Labs  Lab 12/14/20 1119 12/14/20 1717 12/14/20 2053 12/15/20 0717 12/15/20 1134  GLUCAP 151* 281* 160* 190* 210*   Lipid Profile: No results for input(s): CHOL, HDL, LDLCALC, TRIG, CHOLHDL, LDLDIRECT in the  last 72 hours.  Thyroid Function Tests: No results for input(s): TSH, T4TOTAL, FREET4, T3FREE, THYROIDAB in the last 72 hours. Anemia Panel: No results for input(s): VITAMINB12, FOLATE, FERRITIN, TIBC, IRON, RETICCTPCT in the last 72 hours. Urine analysis:    Component Value Date/Time   COLORURINE YELLOW 12/11/2020 0009   APPEARANCEUR HAZY (A) 12/11/2020 0009   LABSPEC 1.025 12/11/2020 0009   PHURINE 5.0 12/11/2020 0009   GLUCOSEU >=500 (A) 12/11/2020 0009   HGBUR LARGE (A) 12/11/2020 0009   HGBUR negative 08/06/2008 1124   BILIRUBINUR NEGATIVE 12/11/2020 0009   KETONESUR NEGATIVE 12/11/2020 0009   PROTEINUR 100 (A) 12/11/2020 0009   UROBILINOGEN 1.0 12/06/2012 1637   NITRITE NEGATIVE 12/11/2020 0009   LEUKOCYTESUR NEGATIVE 12/11/2020 0009   Sepsis Labs: @LABRCNTIP (procalcitonin:4,lacticidven:4)  ) Recent Results (from the past 240 hour(s))  Resp Panel by RT-PCR (Flu A&B, Covid) Nasopharyngeal Swab     Status: None   Collection Time: 12/10/20  9:46 PM   Specimen: Nasopharyngeal Swab; Nasopharyngeal(NP) swabs in vial transport medium  Result Value Ref Range Status   SARS Coronavirus 2 by RT PCR NEGATIVE NEGATIVE Final    Comment: (NOTE) SARS-CoV-2 target nucleic acids are NOT DETECTED.  The SARS-CoV-2 RNA is generally detectable in upper respiratory specimens during the acute phase of infection. The lowest concentration of SARS-CoV-2 viral copies this assay can detect is 138 copies/mL. A  negative result does not preclude SARS-Cov-2 infection and should not be used as the sole basis for treatment or other patient management decisions. A negative result may occur with  improper specimen collection/handling, submission of specimen other than nasopharyngeal swab, presence of viral mutation(s) within the areas targeted by this assay, and inadequate number of viral copies(<138 copies/mL). A negative result must be combined with clinical observations, patient history, and epidemiological information. The expected result is Negative.  Fact Sheet for Patients:  BloggerCourse.com  Fact Sheet for Healthcare Providers:  SeriousBroker.it  This test is no t yet approved or cleared by the [place] FDA and  has been authorized for detection and/or diagnosis of SARS-CoV-2 by FDA under an Emergency Use Authorization (EUA). This EUA will remain  in effect (meaning this test can be used) for the duration of the COVID-19 declaration under Section 564(b)(1) of the Act, 21 U.S.C.section 360bbb-3(b)(1), unless the authorization is terminated  or revoked sooner.       Influenza A by PCR NEGATIVE NEGATIVE Final   Influenza B by PCR NEGATIVE NEGATIVE Final    Comment: (NOTE) The Xpert Xpress SARS-CoV-2/FLU/RSV plus assay is intended as an aid in the diagnosis of influenza from Nasopharyngeal swab specimens and should not be used as a sole basis for treatment. Nasal washings and aspirates are unacceptable for Xpert Xpress SARS-CoV-2/FLU/RSV testing.  Fact Sheet for Patients: BloggerCourse.com  Fact Sheet for Healthcare Providers: SeriousBroker.it  This test is not yet approved or cleared by the [place] FDA and has been authorized for detection and/or diagnosis of SARS-CoV-2 by FDA under an Emergency Use Authorization (EUA). This EUA will remain in effect (meaning this test can  be used) for the duration of the COVID-19 declaration under Section 564(b)(1) of the Act, 21 U.S.C. section 360bbb-3(b)(1), unless the authorization is terminated or revoked.  Performed at [facility], [address] [place], [place] [address]   MRSA Next Gen by PCR, Nasal     Status: None   Collection Time: 12/11/20 12:52 AM   Specimen: Nasal Mucosa; Nasal Swab  Result Value Ref  Range Status   MRSA by PCR Next Gen NOT DETECTED NOT DETECTED Final    Comment: (NOTE) The GeneXpert MRSA Assay (FDA approved for NASAL specimens only), is one component of a comprehensive MRSA colonization surveillance program. It is not intended to diagnose MRSA infection nor to guide or monitor treatment for MRSA infections. Test performance is not FDA approved in patients less than 55 years old. Performed at [facility], [address], [place], [place] [address]   Culture, blood (routine x 2)     Status: None (Preliminary result)   Collection Time: 12/11/20  1:51 AM   Specimen: BLOOD RIGHT HAND  Result Value Ref Range Status   Specimen Description BLOOD RIGHT HAND  Final   Special Requests AEROBIC BOTTLE ONLY Blood Culture adequate volume  Final   Culture   Final    NO GROWTH 4 DAYS Performed at [facility], [address] [place], [place] [address]    Report Status PENDING  Incomplete  Culture, blood (routine x 2)     Status: None (Preliminary result)   Collection Time: 12/11/20  1:51 AM   Specimen: BLOOD LEFT HAND  Result Value Ref Range Status   Specimen Description BLOOD LEFT HAND  Final   Special Requests AEROBIC BOTTLE ONLY Blood Culture adequate volume  Final   Culture   Final    NO GROWTH 4 DAYS Performed at [facility], [address] [place], [place] [address]    Report Status PENDING  Incomplete      Scheduled Meds:  aspirin  81 mg Oral Daily   atorvastatin  80 mg Oral Daily   carvedilol  12.5 mg Oral BID WC   Chlorhexidine Gluconate Cloth  6  each Topical Daily   insulin aspart  0-15 Units Subcutaneous TID WC   insulin aspart  0-5 Units Subcutaneous QHS   insulin aspart  4 Units Subcutaneous TID WC   insulin glargine-yfgn  25 Units Subcutaneous Daily   lidocaine  1 patch Transdermal Q24H   mouth rinse  15 mL Mouth Rinse BID   pantoprazole  40 mg Oral Daily   polyethylene glycol  17 g Oral Daily   sacubitril-valsartan  1 tablet Oral BID   sodium chloride flush  3 mL Intravenous Q12H   Continuous Infusions:  sodium chloride 10 mL/hr at 12/12/20 1000   sodium chloride     cefTRIAXone (ROCEPHIN)  IV Stopped (12/14/20 1819)     LOS: 4 days    Time spent:  Zannie Cove, MD [facility]   12/15/2020, 12:35 PM

## 2020-12-15 NOTE — Progress Notes (Signed)
Inpatient Rehab Admissions Coordinator:   I spoke with Pt. Regarding potential CIR admission. As I arrived to the room, pt. Was ambulating independently and states he has went to the bathroom alone a few times today. Pt. Does not appear to require intensity of CIR at this time. He is in agreement and would like to d/c home when medically stable.CIR will sign off.   Megan Salon, MS, CCC-SLP Rehab Admissions Coordinator  [phone] (celll) [phone] (office)

## 2020-12-15 NOTE — Progress Notes (Signed)
Occupational Therapy Treatment Patient Details Name: James [name]. MRN: [id number] DOB: [birth year] Today's Date: 12/15/2020    History of present illness 60 yo male presenting 8/31 after cardiac arrest sustained at home. Pt recieved 4 rounds of epinephrine, multiple rounds of chest compressions, and x7 rounds of defibrillation prior to ROSC upon arrival to ED. 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   OT comments  James [name] is progressing well with great progress made towards ADLs and functional mobility. He was mod I for all functional tasks this session. He was able to recall specific events from today and discuss medication management. He also recalled 3 words given to him at the start of the session after about 3 minutes of conversation. Educated on energy conservation, pt verbalized understanding. Pt has planned sx tomorrow, OT eval post-op as appropriate. D/c recommendation home with 24 hour support and OP neuro follow up as needed    Follow Up Recommendations  Supervision/Assistance - 24 hour;Other (comment) (Neuro OP)    Equipment Recommendations  None recommended by OT       Precautions / Restrictions Precautions Precautions: Other (comment) Precaution Comments: monitor HR Restrictions Weight Bearing Restrictions: No       Mobility Bed Mobility Overal bed mobility: Modified Independent Bed Mobility: Supine to Sit     Supine to sit: Modified independent (Device/Increase time)     General bed mobility comments: Pt transition supine > sit with mod I HOB elevated without difficulty.    Transfers Overall transfer level: Independent Equipment used: None Transfers: Sit to/from Stand Sit to Stand: Independent         General transfer comment: good insight into safety    Balance Overall balance assessment: Needs assistance Sitting-balance support: No upper extremity supported;Feet supported Sitting  balance-Leahy Scale: Normal     Standing balance support: No upper extremity supported Standing balance-Leahy Scale: Normal Standing balance comment: Able to perform gait challenges and stand in SLS either leg statically without UE support with eyes open for > 10 sec without LOB or assistance. Single Leg Stance - Right Leg: 10 (seconds, no UE support, eyes open, no LOB) Single Leg Stance - Left Leg: 10 (seconds, no UE support, eyes open, no LOB)         High level balance activites: Braiding;Backward walking;Direction changes;Turns;Sudden stops;Head turns;Other (comment) (tandem walking forwards and backwards) High Level Balance Comments: Able to change gait speeds, change directions suddenly, turn quickly, stop suddenly, change head positions, skip, braid step laterally, and walk tandem forwards and backwards all without LOB or assistance.           ADL either performed or assessed with clinical judgement   ADL Overall ADL's : Needs assistance/impaired;Modified independent                                     Functional mobility during ADLs: Modified independent General ADL Comments: Pt's ADLs are at a mod I level for incrsaed time and energy conservation techniques. His funcitonal mobility is Indep      Cognition Arousal/Alertness: Awake/alert Behavior During Therapy: WFL for tasks assessed/performed Overall Cognitive Status: Within Functional Limits for tasks assessed                                 General Comments: Pt is  Ox4, he recalled his breakfast order this AM, and repeated 3 words back to me after ~68minutes of distracting converstaion. Pt's wife states that his cognition seems better today compared to yesterday.              General Comments vitals stable this session    Pertinent Vitals/ Pain       Pain Assessment: Faces Faces Pain Scale: Hurts a little bit Pain Location: ribs Pain Descriptors / Indicators: Sore Pain  Intervention(s): Limited activity within patient's tolerance   Frequency  Min 2X/week        Progress Toward Goals  OT Goals(current goals can now be found in the care plan section)  Progress towards OT goals: Progressing toward goals  Acute Rehab OT Goals Patient Stated Goal: return home and to independence OT Goal Formulation: With patient Time For Goal Achievement: 12/26/20 Potential to Achieve Goals: Good ADL Goals Pt Will Perform Grooming: with modified independence;standing Pt Will Perform Lower Body Dressing: with modified independence;sit to/from stand Pt Will Transfer to Toilet: with modified independence;ambulating;regular height toilet Additional ADL Goal #1: Pt will perform simple IADL with Min cues Additional ADL Goal #2: Pt will perform three part trail making task with Min cues  Plan Discharge plan remains appropriate    Co-evaluation                 AM-PAC OT "6 Clicks" Daily Activity     Outcome Measure   Help from another person eating meals?: None Help from another person taking care of personal grooming?: None Help from another person toileting, which includes using toliet, bedpan, or urinal?: None Help from another person bathing (including washing, rinsing, drying)?: None Help from another person to put on and taking off regular upper body clothing?: None Help from another person to put on and taking off regular lower body clothing?: None 6 Click Score: 24    End of Session    OT Visit Diagnosis: Unsteadiness on feet (R26.81);Other abnormalities of gait and mobility (R26.89);Muscle weakness (generalized) (M62.81)   Activity Tolerance Patient tolerated treatment well   Patient Left in bed;with call bell/phone within reach;with bed alarm set;with family/visitor present   Nurse Communication Mobility status        Time: 6256-3893 OT Time Calculation (min): 13 min  Charges: OT General Charges $OT Visit: 1 Visit OT  Treatments $Therapeutic Activity: 8-22 mins     James [name] 12/15/2020, 4:34 PM

## 2020-12-15 NOTE — Progress Notes (Addendum)
Progress Note  Patient Name: James [name]. Date of Encounter: 12/15/2020  [facility] Cardiologist: Olga Millers, MD   Patient Profile     60 y.o. male 60 y.o. male with a hx of NICM (20-25%, recovered to 55%-cath only noted 50% LAD), cocaine abuse (negative UDS screen with current admission), HTN, DM2, mild to moderate MR who is being seen 12/11/2020 for the evaluation of Vfib arrest at the request of Dr. Chestine Spore (PCCM)  He presented to ER 12/10/2020 with V. fib/V. tach arrest.  Was resting in his chair, and "dozed off".  Was noted to be moaning and foaming at the mouth.  EMS was contacted but CPR not started until arrival.  Reportedly 7 defibrillation shocks 3 total of 450 mg IV amiodarone and 4 rounds of epinephrine restoring ROSC.      Assessment & Plan    1.  VT/VF cardiac arrest: Presumed due to a shockable rhythm.  He had 7 defibrillations, 450 mg of amiodarone, and 4 rounds of epinephrine.  No obvious coronary artery disease to explain his arrhythmias.  MRI consistent with NICM     Plan for ICD tomorrow   NOte he is not on amiodarone now with elevated LFTs    2.  Chronic systolic heart failure: Ejection fraction 40 to 45%.  No obvious volume overload.  Currently on carvedilol   WIll add Entresto today  Follow BP and labs  3.  Cardiogenic shock: IMproved   LFTs still increased   WIll check hep panel  4.  Elevated transaminases: Very slow in recovery  RUQ usn showed edematous GB   PT with mild RUQ tenderness   WBC is normal    Will order hep panel   Discussed with GI who feel that transaminases probably should have improved further by now     Thickened GB may be nonspecific but given plans for implantation of a device I will sched a HIDA scan to r/o surgical considerations.    If cannot be done today  try ot order for early am   Pt on sched for defib as last case on Tues  5  Cocaine abuse: Has been abstinent for YEARS Tox screen on admission negative for cocaine.  6.  Type  2 diabetes: Hemoglobin A1c of 10.7.  Discussed low sugar diet   Will need SGLT2 inhibitor at discharge.  Plan per primary team.     Subjective  Chest is still sore  INcreases discomfort with movement, deep breath   NO SOB at rest    Inpatient Medications    Scheduled Meds:  aspirin  81 mg Oral Daily   atorvastatin  80 mg Oral Daily   carvedilol  12.5 mg Oral BID WC   Chlorhexidine Gluconate Cloth  6 each Topical Daily   insulin aspart  0-15 Units Subcutaneous TID WC   insulin aspart  0-5 Units Subcutaneous QHS   insulin aspart  4 Units Subcutaneous TID WC   insulin glargine-yfgn  25 Units Subcutaneous Daily   lidocaine  1 patch Transdermal Q24H   mouth rinse  15 mL Mouth Rinse BID   pantoprazole  40 mg Oral Daily   polyethylene glycol  17 g Oral Daily   potassium chloride  40 mEq Oral Once   sodium chloride flush  3 mL Intravenous Q12H   Continuous Infusions:  sodium chloride 10 mL/hr at 12/12/20 1000   sodium chloride     cefTRIAXone (ROCEPHIN)  IV Stopped (12/14/20 1819)  PRN Meds: sodium chloride, acetaminophen, docusate, ondansetron (ZOFRAN) IV, oxyCODONE-acetaminophen, polyethylene glycol, sodium chloride flush, traMADol   Vital Signs    Vitals:   12/14/20 0505 12/14/20 0800 12/14/20 1738 12/15/20 0358  BP: 137/83 (!) 159/96 (!) 156/81 (!) 147/89  Pulse: 89  88 88  Resp: 17 18  20   Temp: 97.8 F (36.6 C) 97.8 F (36.6 C)  97.8 F (36.6 C)  TempSrc: Oral   Oral  SpO2: 98% 96%  95%  Weight: 72.3 kg   72.6 kg  Height:        Intake/Output Summary (Last 24 hours) at 12/15/2020 0800 Last data filed at 12/14/2020 2050 Gross per 24 hour  Intake 700 ml  Output --  Net 700 ml   Last 3 Weights 12/15/2020 12/14/2020 12/12/2020  Weight (lbs) 160 lb 1.6 oz 159 lb 4.8 oz 152 lb 8.9 oz  Weight (kg) 72.621 kg 72.258 kg 69.2 kg  Some encounter information is confidential and restricted. Go to Review Flowsheets activity to see all data.      Telemetry    NSR with PVCs   personally reviewed  ECG    None new  Physical Exam   GEN: Well nourished, well developed, in no acute distress  HEENT: normal  Neck:  JVP is normal  Cardiac: RRR with occasional skip  no murmurs  No LE  edema  Respiratory:  clear to auscultation bilaterally, normal work of breathing GI: soft, nontender, nondistended, + BS  Mild RUQ tenderness  MS: no deformity or atrophy  Skin: warm and dry Neuro:  Strength and sensation are intact Psych: euthymic mood, full affect   Labs    High Sensitivity Troponin:   Recent Labs  Lab 12/10/20 2145 12/11/20 0029 12/11/20 0940 12/11/20 1142  TROPONINIHS 74* 465* 2,930* 2,837*      Chemistry Recent Labs  Lab 12/13/20 0105 12/14/20 0126 12/15/20 0051  NA 136 134* 138  K 4.3 4.2 3.8  CL 104 104 105  CO2 24 24 27   GLUCOSE 182* 376* 79  BUN 18 16 14   CREATININE 1.03 1.03 0.86  CALCIUM 8.4* 8.2* 8.7*  PROT 5.7* 5.3* 5.2*  ALBUMIN 3.1* 2.7* 2.5*  AST 288* 230* 196*  ALT 303* 210* 166*  ALKPHOS 69 58 56  BILITOT 0.4 0.6 0.4  GFRNONAA >60 >60 >60  ANIONGAP 8 6 6      Hematology Recent Labs  Lab 12/13/20 0105 12/14/20 0126 12/15/20 0051  WBC 6.5 4.4 4.1  RBC 4.90 4.21* 4.19*  HGB 13.7 11.7* 11.9*  HCT 41.8 35.9* 35.6*  MCV 85.3 85.3 85.0  MCH 28.0 27.8 28.4  MCHC 32.8 32.6 33.4  RDW 15.6* 15.5 15.3  PLT 184 165 188    BNPNo results for input(s): BNP, PROBNP in the last 168 hours.   DDimer No results for input(s): DDIMER in the last 168 hours.    Cardiac Studies - Summary   L Heart Cath 01/25/21     Mid LAD lesion is 50% stenosed.   Mild to moderate nonobstructive CAD with calcification of the mid LAD with previously noted 50% stenosis not significantly changed.  Normal intermediate, circumflex, and RCA.   LVEDP 11 mmHg.  2D Echo 12/11/2020: EF 40 to 45% global HK.  Normal diastolic parameters.  Normal RV size and function.  Normal valves.  MRI   12/14/20 IMPRESSION: Arrhythmia artifact significantly impacts  image quality and accuracy of volumetric assessment.   1. Severe left ventricular systolic dysfunction, LVEF 32% with  moderate left ventricular chamber enlargement   2. Mild right ventricular systolic dysfunction, RVEF 44%, with normal right ventricular chamber size.   3.  No myocardial edema   4. Mid myocardial stripe of delayed enhancement in the ventricular septum and basal lateral wall. Nonspecific finding, seen in nonischemic cardiomyopathy.   Findings consistent with nonischemic cardiomyopathy. No evidence of infiltrative, inflammatory process or prior infarct.   Radiology    MR CARDIAC MORPHOLOGY W WO CONTRAST  Result Date: 12/15/2020 CLINICAL DATA:  Ventricular tachycardia (VT), sustained VF arrest EXAM: CARDIAC MRI TECHNIQUE: The patient was scanned on a 1.5 Tesla GE magnet. A dedicated cardiac coil was used. Functional imaging was done using Fiesta sequences. 2,3, and 4 chamber views were done to assess for RWMA's. Modified Simpson's rule using a short axis stack was used to calculate an ejection fraction on a dedicated work [employer]. The patient received 51mL GADAVIST GADOBUTROL 1 MMOL/ML IV SOLN. After 10 minutes inversion recovery sequences were used to assess for infiltration and scar tissue. This examination is tailored for evaluation cardiac anatomy and function and provides very limited assessment of noncardiac structures, which are accordingly not evaluated during interpretation. If there is clinical concern for extracardiac pathology, further evaluation with CT imaging should be considered. FINDINGS: LEFT VENTRICLE: Moderately dilated left ventricular chamber size. Normal left ventricular wall thickness. Severely reduced left ventricular systolic function. LVEF = 32% Global hypokinesis. No myocardial edema, T2 = 49 msec Normal first pass perfusion. There is post contrast delayed myocardial enhancement. Mid myocardial stripe of delayed enhancement in the  ventricular septum, and basal lateral wall. This is a nonspecific finding and can be seen in nonischemic cardiomyopathy. Normal T1 myocardial nulling kinetics suggest against a diagnosis of cardiac amyloidosis. ECV = 33% RIGHT VENTRICLE: Normal right ventricular chamber size. Normal right ventricular wall thickness. Mildly reduced right ventricular systolic function. RVEF = 44% Mild global hypokinesis. No post contrast delayed myocardial enhancement. ATRIA: Mild enlargement of left atrial size. Normal right atrial size. VALVES: Cannot be assessed in setting of artifact from arrhythmia. PERICARDIUM: Normal pericardium.  Small pericardial effusion. OTHER: Small bilateral pleural effusions R > L. MEASUREMENTS: Volumes and EF are approximate values in the setting of significant arrhythmia motion artifact. Left ventricle: LV male LV EF: 32% (Normal 56-78%) Absolute volumes: LV EDV: 226 mL (Normal 77-195 mL) LV ESV: 154 mL (Normal 19-72 mL) LV SV: 72 mL (Normal 51-133 mL) CO: 6.0 L/min (Normal 2.8-8.8 L/min) Indexed volumes: LV EDV: 118 mL/sq-m (Normal 47-92 mL/sq-m) LV ESV: 80 mL/sq-m (Normal 13-30 mL/sq-m) LV SV: 38 mL/sq-m (Normal 32-62 mL/sq-m) CI: 3.15 L/min/sq-m (Normal 1.7-4.2 L/min/sq-m) Right ventricle: RV male RV EF: 44 % (Normal 47-74%) Absolute volumes: RV EDV: 191 mL (Normal 88-227 mL) RV ESV: 108 mL (Normal 23-103 mL) RV SV: 84 mL (Normal 52-138 mL) CO: 7.0 L/min (Normal 2.8-8.8 L/min) Indexed volumes: RV EDV: 100 mL/sq-m (Normal 55-105 mL/sq-m) RV ESV: 56 mL/sq-m (Normal 15-43 mL/sq-m) RV SV: 44 mL/sq-m (Normal 32-64 mL/sq-m) CI: 3.66 L/min/sq-m (Normal 1.7-4.2 L/min/sq-m) IMPRESSION: Arrhythmia artifact significantly impacts image quality and accuracy of volumetric assessment. 1. Severe left ventricular systolic dysfunction, LVEF 32% with moderate left ventricular chamber enlargement 2. Mild right ventricular systolic dysfunction, RVEF 44%, with normal right ventricular chamber size. 3.  No myocardial  edema 4. Mid myocardial stripe of delayed enhancement in the ventricular septum and basal lateral wall. Nonspecific finding, seen in nonischemic cardiomyopathy. Findings consistent with nonischemic cardiomyopathy. No evidence of infiltrative, inflammatory process or prior  infarct. Electronically Signed   By: Weston Brass M.D.   On: 12/15/2020 07:10       For questions or updates, please contact [facility] Please consult [email or web address] for contact info under        Signed, Dietrich Pates, MD  12/15/2020, 8:00 AM

## 2020-12-16 ENCOUNTER — Inpatient Hospital Stay (HOSPITAL_COMMUNITY): Payer: Self-pay

## 2020-12-16 ENCOUNTER — Encounter (HOSPITAL_COMMUNITY): Payer: Self-pay | Admitting: Cardiology

## 2020-12-16 ENCOUNTER — Encounter (HOSPITAL_COMMUNITY)
Admission: EM | Disposition: A | Discharge status: Home / Self Care | Payer: Self-pay | Source: Home / Self Care | Attending: Internal Medicine

## 2020-12-16 DIAGNOSIS — I5020 Unspecified systolic (congestive) heart failure: Secondary | ICD-10-CM

## 2020-12-16 DIAGNOSIS — I428 Other cardiomyopathies: Secondary | ICD-10-CM

## 2020-12-16 DIAGNOSIS — R7401 Elevation of levels of liver transaminase levels: Secondary | ICD-10-CM

## 2020-12-16 HISTORY — PX: ICD IMPLANT: EP1208

## 2020-12-16 LAB — CBC
HCT: 39.1 % (ref 39.0–52.0)
Hemoglobin: 12.8 g/dL — ABNORMAL LOW (ref 13.0–17.0)
MCH: 28 pg (ref 26.0–34.0)
MCHC: 32.7 g/dL (ref 30.0–36.0)
MCV: 85.6 fL (ref 80.0–100.0)
Platelets: 219 10*3/uL (ref 150–400)
RBC: 4.57 MIL/uL (ref 4.22–5.81)
RDW: 15.2 % (ref 11.5–15.5)
WBC: 4.8 10*3/uL (ref 4.0–10.5)
nRBC: 0 % (ref 0.0–0.2)

## 2020-12-16 LAB — GLUCOSE, CAPILLARY
Glucose-Capillary: 199 mg/dL — ABNORMAL HIGH (ref 70–99)
Glucose-Capillary: 88 mg/dL (ref 70–99)
Glucose-Capillary: 142 mg/dL — ABNORMAL HIGH (ref 70–99)
Glucose-Capillary: 56 mg/dL — ABNORMAL LOW (ref 70–99)
Glucose-Capillary: 93 mg/dL (ref 70–99)
Glucose-Capillary: 291 mg/dL — ABNORMAL HIGH (ref 70–99)

## 2020-12-16 LAB — COMPREHENSIVE METABOLIC PANEL
ALT: 140 U/L — ABNORMAL HIGH (ref 0–44)
AST: 135 U/L — ABNORMAL HIGH (ref 15–41)
Albumin: 2.6 g/dL — ABNORMAL LOW (ref 3.5–5.0)
Alkaline Phosphatase: 59 U/L (ref 38–126)
Anion gap: 3 — ABNORMAL LOW (ref 5–15)
BUN: 15 mg/dL (ref 8–23)
CO2: 31 mmol/L (ref 22–32)
Calcium: 8.7 mg/dL — ABNORMAL LOW (ref 8.9–10.3)
Chloride: 102 mmol/L (ref 98–111)
Creatinine, Ser: 1.09 mg/dL (ref 0.61–1.24)
GFR, Estimated: >60 mL/min (ref >60)
Glucose, Bld: 141 mg/dL — ABNORMAL HIGH (ref 70–99)
Potassium: 4.7 mmol/L (ref 3.5–5.1)
Sodium: 136 mmol/L (ref 135–145)
Total Bilirubin: 0.5 mg/dL (ref 0.3–1.2)
Total Protein: 5.5 g/dL — ABNORMAL LOW (ref 6.5–8.1)

## 2020-12-16 LAB — SURGICAL PCR SCREEN
MRSA, PCR: NEGATIVE
Staphylococcus aureus: NEGATIVE

## 2020-12-16 LAB — CULTURE, BLOOD (ROUTINE X 2)
Culture: NO GROWTH
Culture: NO GROWTH
Special Requests: ADEQUATE
Special Requests: ADEQUATE

## 2020-12-16 SURGERY — ICD IMPLANT

## 2020-12-16 MED ORDER — FENTANYL CITRATE (PF) 100 MCG/2ML IJ SOLN
INTRAMUSCULAR | Status: DC | PRN
  Administered 2020-12-16: 25 ug via INTRAVENOUS

## 2020-12-16 MED ORDER — ONDANSETRON HCL 4 MG/2ML IJ SOLN: 4.0000 mg | Freq: Four times a day (QID) | INTRAMUSCULAR | Status: DC | PRN

## 2020-12-16 MED ORDER — SODIUM CHLORIDE 0.9 % IV SOLN
80.0000 mg | INTRAVENOUS | Status: AC
  Administered 2020-12-16: 80 mg

## 2020-12-16 MED ORDER — DEXTROSE 50 % IV SOLN
INTRAVENOUS | Status: AC
  Administered 2020-12-16: 50 mL
  Filled 2020-12-16: qty 50

## 2020-12-16 MED ORDER — DEXTROSE 50 % IV SOLN
INTRAVENOUS | Status: DC | PRN
Start: 2020-12-16 — End: 2020-12-16
  Administered 2020-12-16: 25 mL via INTRAVENOUS

## 2020-12-16 MED ORDER — FENTANYL CITRATE (PF) 100 MCG/2ML IJ SOLN
INTRAMUSCULAR | Status: AC
  Filled 2020-12-16: qty 2

## 2020-12-16 MED ORDER — CHLORHEXIDINE GLUCONATE 4 % EX LIQD
60.0000 mL | Freq: Once | CUTANEOUS | Status: DC
Start: 2020-12-16 — End: 2020-12-16

## 2020-12-16 MED ORDER — SODIUM CHLORIDE 0.9 % IV SOLN
INTRAVENOUS | Status: DC
  Administered 2020-12-16: 1000 mL via INTRAVENOUS

## 2020-12-16 MED ORDER — CHLORHEXIDINE GLUCONATE 4 % EX LIQD
60.0000 mL | Freq: Once | CUTANEOUS | Status: AC
  Administered 2020-12-16: 4 via TOPICAL
  Filled 2020-12-16: qty 60

## 2020-12-16 MED ORDER — MIDAZOLAM HCL 5 MG/5ML IJ SOLN
INTRAMUSCULAR | Status: DC | PRN
  Administered 2020-12-16: 1 mg via INTRAVENOUS

## 2020-12-16 MED ORDER — LIDOCAINE HCL (PF) 1 % IJ SOLN
INTRAMUSCULAR | Status: DC | PRN
  Administered 2020-12-16: 60 mL

## 2020-12-16 MED ORDER — SODIUM CHLORIDE 0.9% FLUSH: 3.0000 mL | INTRAVENOUS | Status: DC | PRN

## 2020-12-16 MED ORDER — DAPAGLIFLOZIN PROPANEDIOL 5 MG PO TABS
5.0000 mg | ORAL_TABLET | Freq: Every day | ORAL | Status: DC
  Administered 2020-12-16 – 2020-12-17 (×2): 5 mg via ORAL
  Filled 2020-12-16 (×2): qty 1

## 2020-12-16 MED ORDER — TECHNETIUM TC 99M MEBROFENIN IV KIT
5.0000 | PACK | Freq: Once | INTRAVENOUS | Status: AC | PRN
  Administered 2020-12-16: 5 via INTRAVENOUS

## 2020-12-16 MED ORDER — SODIUM CHLORIDE 0.9% FLUSH
3.0000 mL | Freq: Two times a day (BID) | INTRAVENOUS | Status: DC
  Administered 2020-12-16 – 2020-12-17 (×2): 3 mL via INTRAVENOUS

## 2020-12-16 MED ORDER — CARVEDILOL 25 MG PO TABS
25.0000 mg | ORAL_TABLET | Freq: Two times a day (BID) | ORAL | Status: DC
  Administered 2020-12-16 – 2020-12-17 (×2): 25 mg via ORAL
  Filled 2020-12-16 (×2): qty 1

## 2020-12-16 MED ORDER — CEFAZOLIN SODIUM-DEXTROSE 2-4 GM/100ML-% IV SOLN
2.0000 g | INTRAVENOUS | Status: AC
Start: 2020-12-16 — End: 2020-12-16
  Administered 2020-12-16: 2 g via INTRAVENOUS

## 2020-12-16 MED ORDER — SODIUM CHLORIDE 0.9 % IV SOLN
INTRAVENOUS | Status: AC
  Filled 2020-12-16: qty 2

## 2020-12-16 MED ORDER — SODIUM CHLORIDE 0.9 % IV SOLN
250.0000 mL | INTRAVENOUS | Status: DC
  Administered 2020-12-16: 250 mL via INTRAVENOUS

## 2020-12-16 MED ORDER — CEFAZOLIN SODIUM-DEXTROSE 2-4 GM/100ML-% IV SOLN
INTRAVENOUS | Status: AC
  Filled 2020-12-16: qty 100

## 2020-12-16 MED ORDER — MIDAZOLAM HCL 5 MG/5ML IJ SOLN
INTRAMUSCULAR | Status: AC
  Filled 2020-12-16: qty 5

## 2020-12-16 MED ORDER — ACETAMINOPHEN 325 MG PO TABS: 325.0000 mg | ORAL_TABLET | ORAL | Status: DC | PRN

## 2020-12-16 MED ORDER — HEPARIN (PORCINE) IN NACL 1000-0.9 UT/500ML-% IV SOLN
INTRAVENOUS | Status: DC | PRN
  Administered 2020-12-16: 500 mL

## 2020-12-16 SURGICAL SUPPLY — 11 items
CABLE SURGICAL S-101-97-12 (CABLE) ×2 IMPLANT
ICD VISIA MRI VR DVFB1D4 (ICD Generator) ×1 IMPLANT
LEAD SPRINT QUAT SEC 6935M-62 (Lead) ×1 IMPLANT
MAT PREVALON FULL STRYKER (MISCELLANEOUS) ×2 IMPLANT
PAD PRO RADIOLUCENT 2001M-C (PAD) ×2 IMPLANT
POUCH AIGIS-R ANTIBACT ICD (Mesh General) ×2 IMPLANT
POUCH AIGIS-R ANTIBACT ICD LRG (Mesh General) IMPLANT
SHEATH 9FR PRELUDE SNAP 13 (SHEATH) ×1 IMPLANT
SHEATH PROBE COVER 6X72 (BAG) ×1 IMPLANT
TRAY PACEMAKER INSERTION (PACKS) ×2 IMPLANT
VISIA MRI VR DVFB1D4 (ICD Generator) ×2 IMPLANT

## 2020-12-16 NOTE — Progress Notes (Signed)
PROGRESS NOTE    James [name].  EYC:[id number] DOB: 14-Oct-1960 DOA: 12/10/2020 PCP: Storm Frisk, MD  Brief Narrative:60 y/o gentleman with a history of NICM with recovered EF, HTN, DM who presented post-cardiac arrest. He was watching TV with his daughter and dozed off. He began foaming at the mouth and she called 911 and initiated resuscitation. EMS arrived in 3 minutes and he was in VT/VF. He received defibrillations amiodarone 450mg  total, and epinephrine x 4 doses. He was intubated in the field and had achieved ROSC upon arrival to the ED. He had not complained of new or unusual symptoms leading up to this. 8/31 admitted s/p card arrest; intubated  9/1: extubated; weaned off levo 9/2: heart cath neg, EP consulted, cardiac MRI and ICD recommended 9/4, cardiac MRI suggests nonischemic cardiomyopathy, no infiltrative disease noted   Subjective: -Feels okay overall, has some tenderness side of the rib fractures -About to go down for HIDA scan  Assessment & Plan:   VT/V. fib arrest-out of hospital -Treated with multiple rounds of defibrillations, amiodarone and 4 rounds of epi -Admitted to the ICU, weaned off Levophed on 9/1 -Echo with EF of 40-45%, left heart cath noted nonobstructive CAD, presumed nonischemic, known history of NICM, EF of 20-25% recovered previously -UDS negative -Cards, EP following -Started on carvedilol, dose increased -Cardiac MRI completed, suggest EF of 32%, nonischemic cardiomyopathy suspected, no infiltrative diseases noted -Plan for ICD placement possibly today if HIDA scan is negative  Cardiogenic shock -Following V. fib arrest, weaned off epinephrine at 9/1  Rib fractures, following CPR -Incentive spirometry, supportive care  Acute on chronic systolic CHF -See discussion above, echo with EF of 40-45%, cath with nonobstructive CAD -Clinically euvolemic, continue carvedilol  Type 2 diabetes mellitus -CBGs poorly controlled, increase  glargine, add NovoLog with meals  Post arrest encephalopathy -Improving  AKI -Resolved  Shock liver Abnormal LFTs -Improving, bilirubin is normal, no fever or leukocytosis -Clinical suspicion for cholecystitis is low, cardiology ordered HIDA scan given need for ICD, await results  Aspiration pneumonitis -Improving, completed 5-day course of Rocephin  Multiple rib fractures -Secondary to CPR, continue incentive spirometry  Right renal lesion -Incidentally noted on imaging, needs follow-up  DVT prophylaxis: Resume Lovenox after ICD Code Status: Full code Family Communication: Discussed patient in detail, no family at bedside Disposition Plan:  Status is: Inpatient  Remains inpatient appropriate because:Inpatient level of care appropriate due to severity of illness  Dispo: The patient is from: Home              Anticipated d/c is to: Home with home health services              Patient currently is not medically stable to d/c.   Difficult to place patient No  Consultants:  Cards, EP PCCM transfer  Procedures: Right and Left Heart Cath 04/2016: Moderate mid LAD 50% stenosis otherwise minimal CAD consistent with nonischemic cardiomyopathy.  Normal right heart pressures with mildly elevated left-sided filling pressures.  No pulmonary hypertension.. 2D Echo 12/11/2020: EF 40 to 45% global HK.  Normal diastolic parameters.  Normal RV size and function.  Normal valves.  Antimicrobials:    Objective: Vitals:   12/16/20 0944 12/16/20 0957 12/16/20 1342 12/16/20 1500  BP: 138/68  (!) 115/59 (!) 142/79  Pulse:  97 85 84  Resp: 18  18   Temp: 97.9 F (36.6 C)  98.1 F (36.7 C) 97.7 F (36.5 C)  TempSrc: Oral  Oral Oral  SpO2:  98%   Weight:   72.8 kg   Height:   6' 0.01" (1.829 m)     Intake/Output Summary (Last 24 hours) at 12/16/2020 1536 Last data filed at 12/16/2020 0800 Gross per 24 hour  Intake 0 ml  Output --  Net 0 ml   Filed Weights   12/15/20 0358 12/16/20  0537 12/16/20 1342  Weight: 72.6 kg 72.8 kg 72.8 kg    Examination:  General exam: Awake alert oriented x3, no distress CVS: S1-S2, regular rate rhythm Lungs: Clear bilaterally Abdomen: Soft, nontender, bowel sounds present Extremities: No edema Skin: No rashes Psychiatry:  Mood & affect appropriate.     Data Reviewed:   CBC: Recent Labs  Lab 12/10/20 2145 12/10/20 2154 12/12/20 0029 12/13/20 0105 12/14/20 0126 12/15/20 0051 12/16/20 0301  WBC 6.6   < > 8.7 6.5 4.4 4.1 4.8  NEUTROABS 1.9  --   --   --   --   --   --   HGB 13.4   < > 12.9* 13.7 11.7* 11.9* 12.8*  HCT 44.3   < > 40.2 41.8 35.9* 35.6* 39.1  MCV 92.7   < > 86.3 85.3 85.3 85.0 85.6  PLT 212   < > 169 184 165 188 219   < > = values in this interval not displayed.   Basic Metabolic Panel: Recent Labs  Lab 12/10/20 2145 12/10/20 2154 12/11/20 0151 12/11/20 0458 12/12/20 0029 12/13/20 0105 12/14/20 0126 12/15/20 0051 12/16/20 0301  NA 139   < > 136   < > 137 136 134* 138 136  K 4.4   < > 5.0   < > 3.9 4.3 4.2 3.8 4.7  CL 106   < > 107  --  107 104 104 105 102  CO2 17*  --  18*  --  22 24 24 27 31   GLUCOSE 317*   < > 290*  --  114* 182* 376* 79 141*  BUN 20   < > 21  --  18 18 16 14 15   CREATININE 1.44*   < > 1.20  --  1.12 1.03 1.03 0.86 1.09  CALCIUM 8.3*  --  8.2*  --  8.4* 8.4* 8.2* 8.7* 8.7*  MG 2.1  --  2.0  --  2.1 2.2 2.0  --   --   PHOS  --   --  4.3  --   --   --  3.0  --   --    < > = values in this interval not displayed.   GFR: Estimated Creatinine Clearance: 72.4 mL/min (by C-G formula based on SCr of 1.09 mg/dL). Liver Function Tests: Recent Labs  Lab 12/12/20 0029 12/13/20 0105 12/14/20 0126 12/15/20 0051 12/16/20 0301  AST 444* 288* 230* 196* 135*  ALT 445* 303* 210* 166* 140*  ALKPHOS 79 69 58 56 59  BILITOT 0.8 0.4 0.6 0.4 0.5  PROT 5.9* 5.7* 5.3* 5.2* 5.5*  ALBUMIN 3.4* 3.1* 2.7* 2.5* 2.6*   No results for input(s): LIPASE, AMYLASE in the last 168 hours. No  results for input(s): AMMONIA in the last 168 hours. Coagulation Profile: No results for input(s): INR, PROTIME in the last 168 hours. Cardiac Enzymes: No results for input(s): CKTOTAL, CKMB, CKMBINDEX, TROPONINI in the last 168 hours. BNP (last 3 results) No results for input(s): PROBNP in the last 8760 hours. HbA1C: No results for input(s): HGBA1C in the last 72 hours. CBG: Recent Labs  Lab 12/15/20 1610 12/15/20  2038 12/16/20 0944 12/16/20 1243 12/16/20 1530  GLUCAP 174* 139* 199* 88 56*   Lipid Profile: No results for input(s): CHOL, HDL, LDLCALC, TRIG, CHOLHDL, LDLDIRECT in the last 72 hours.  Thyroid Function Tests: No results for input(s): TSH, T4TOTAL, FREET4, T3FREE, THYROIDAB in the last 72 hours. Anemia Panel: No results for input(s): VITAMINB12, FOLATE, FERRITIN, TIBC, IRON, RETICCTPCT in the last 72 hours. Urine analysis:    Component Value Date/Time   COLORURINE YELLOW 12/11/2020 0009   APPEARANCEUR HAZY (A) 12/11/2020 0009   LABSPEC 1.025 12/11/2020 0009   PHURINE 5.0 12/11/2020 0009   GLUCOSEU >=500 (A) 12/11/2020 0009   HGBUR LARGE (A) 12/11/2020 0009   HGBUR negative 08/06/2008 1124   BILIRUBINUR NEGATIVE 12/11/2020 0009   KETONESUR NEGATIVE 12/11/2020 0009   PROTEINUR 100 (A) 12/11/2020 0009   UROBILINOGEN 1.0 12/06/2012 1637   NITRITE NEGATIVE 12/11/2020 0009   LEUKOCYTESUR NEGATIVE 12/11/2020 0009   Sepsis Labs: @LABRCNTIP (procalcitonin:4,lacticidven:4)  ) Recent Results (from the past 240 hour(s))  Resp Panel by RT-PCR (Flu A&B, Covid) Nasopharyngeal Swab     Status: None   Collection Time: 12/10/20  9:46 PM   Specimen: Nasopharyngeal Swab; Nasopharyngeal(NP) swabs in vial transport medium  Result Value Ref Range Status   SARS Coronavirus 2 by RT PCR NEGATIVE NEGATIVE Final    Comment: (NOTE) SARS-CoV-2 target nucleic acids are NOT DETECTED.  The SARS-CoV-2 RNA is generally detectable in upper respiratory specimens during the acute phase  of infection. The lowest concentration of SARS-CoV-2 viral copies this assay can detect is 138 copies/mL. A negative result does not preclude SARS-Cov-2 infection and should not be used as the sole basis for treatment or other patient management decisions. A negative result may occur with  improper specimen collection/handling, submission of specimen other than nasopharyngeal swab, presence of viral mutation(s) within the areas targeted by this assay, and inadequate number of viral copies(<138 copies/mL). A negative result must be combined with clinical observations, patient history, and epidemiological information. The expected result is Negative.  Fact Sheet for Patients:  12/12/20  Fact Sheet for Healthcare Providers:  BloggerCourse.com  This test is no t yet approved or cleared by the [employer]ious[employer].[place] FDA and  has been authorized for detection and/or diagnosis of SARS-CoV-2 by FDA under an Emergency Use Authorization (EUA). This EUA will remain  in effect (meaning this test can be used) for the duration of the COVID-19 declaration under Section 564(b)(1) of the Act, 21 U.S.C.section 360bbb-3(b)(1), unless the authorization is terminated  or revoked sooner.       Influenza A by PCR NEGATIVE NEGATIVE Final   Influenza B by PCR NEGATIVE NEGATIVE Final    Comment: (NOTE) The Xpert Xpress SARS-CoV-2/FLU/RSV plus assay is intended as an aid in the diagnosis of influenza from Nasopharyngeal swab specimens and should not be used as a sole basis for treatment. Nasal washings and aspirates are unacceptable for Xpert Xpress SARS-CoV-2/FLU/RSV testing.  Fact Sheet for Patients: [place]  Fact Sheet for Healthcare Providers: BloggerCourse.com  This test is not yet approved or cleared by the [employer]ious[employer]ker.[place] FDA and has been authorized for detection and/or diagnosis of  SARS-CoV-2 by FDA under an Emergency Use Authorization (EUA). This EUA will remain in effect (meaning this test can be used) for the duration of the COVID-19 declaration under Section 564(b)(1) of the Act, 21 U.S.C. section 360bbb-3(b)(1), unless the authorization is terminated or revoked.  Performed at [facility], [address], [place], [place]  MRSA Next Gen by PCR, Nasal     Status: None   Collection Time: 12/11/20 12:52 AM   Specimen: Nasal Mucosa; Nasal Swab  Result Value Ref Range Status   MRSA by PCR Next Gen NOT DETECTED NOT DETECTED Final    Comment: (NOTE) The GeneXpert MRSA Assay (FDA approved for NASAL specimens only), is one component of a comprehensive MRSA colonization surveillance program. It is not intended to diagnose MRSA infection nor to guide or monitor treatment for MRSA infections. Test performance is not FDA approved in patients less than 4 years old. Performed at [facility], [address], [place], [place] [address]   Culture, blood (routine x 2)     Status: None   Collection Time: 12/11/20  1:51 AM   Specimen: BLOOD RIGHT HAND  Result Value Ref Range Status   Specimen Description BLOOD RIGHT HAND  Final   Special Requests AEROBIC BOTTLE ONLY Blood Culture adequate volume  Final   Culture   Final    NO GROWTH 5 DAYS Performed at [facility], [address], [place], [place] [address]    Report Status 12/16/2020 FINAL  Final  Culture, blood (routine x 2)     Status: None   Collection Time: 12/11/20  1:51 AM   Specimen: BLOOD LEFT HAND  Result Value Ref Range Status   Specimen Description BLOOD LEFT HAND  Final   Special Requests AEROBIC BOTTLE ONLY Blood Culture adequate volume  Final   Culture   Final    NO GROWTH 5 DAYS Performed at [facility], [address], [place], [place] [address]    Report Status 12/16/2020 FINAL  Final      Scheduled Meds:  dextrose       aspirin  81 mg Oral Daily    atorvastatin  80 mg Oral Daily   carvedilol  25 mg Oral BID WC   Chlorhexidine Gluconate Cloth  6 each Topical Daily   dapagliflozin propanediol  5 mg Oral Daily   gentamicin irrigation  80 mg Irrigation On Call   insulin aspart  0-15 Units Subcutaneous TID WC   insulin aspart  0-5 Units Subcutaneous QHS   insulin aspart  4 Units Subcutaneous TID WC   insulin glargine-yfgn  25 Units Subcutaneous Daily   lidocaine  1 patch Transdermal Q24H   mouth rinse  15 mL Mouth Rinse BID   pantoprazole  40 mg Oral Daily   polyethylene glycol  17 g Oral Daily   sacubitril-valsartan  1 tablet Oral BID   sodium chloride flush  3 mL Intravenous Q12H   sodium chloride flush  3 mL Intravenous Q12H   Continuous Infusions:  sodium chloride 10 mL/hr at 12/12/20 1000   sodium chloride     sodium chloride 1,000 mL (12/16/20 1325)   sodium chloride 250 mL (12/16/20 1326)    ceFAZolin (ANCEF) IV       LOS: 5 days    Time spent:  Zannie Cove, MD [facility]   12/16/2020, 3:36 PM

## 2020-12-16 NOTE — Care Management (Signed)
1422 12-16-20 Hospital follow up appointment scheduled at the [facility] and [facility]; information placed on the AVS. Case Manager will follow for [facility] needs.

## 2020-12-16 NOTE — Progress Notes (Addendum)
Progress Note  Patient Name: James [name]. Date of Encounter: 12/16/2020  Primary Cardiologist: Olga Millers, MD   Subjective   Overnight patient has been able to ambulate without incident.  Patient notes no CP, SOB or palpitations.  Doesn't have symptoms of PVCs.  Family is present and is hard of hearing (uses [employer] to assist).  Discussed questions of device and after care.  Inpatient Medications    Scheduled Meds:  aspirin  81 mg Oral Daily   atorvastatin  80 mg Oral Daily   carvedilol  12.5 mg Oral BID WC   chlorhexidine  60 mL Topical Once   Chlorhexidine Gluconate Cloth  6 each Topical Daily   gentamicin irrigation  80 mg Irrigation On Call   insulin aspart  0-15 Units Subcutaneous TID WC   insulin aspart  0-5 Units Subcutaneous QHS   insulin aspart  4 Units Subcutaneous TID WC   insulin glargine-yfgn  25 Units Subcutaneous Daily   lidocaine  1 patch Transdermal Q24H   mouth rinse  15 mL Mouth Rinse BID   pantoprazole  40 mg Oral Daily   polyethylene glycol  17 g Oral Daily   sacubitril-valsartan  1 tablet Oral BID   sodium chloride flush  3 mL Intravenous Q12H   sodium chloride flush  3 mL Intravenous Q12H   Continuous Infusions:  sodium chloride 10 mL/hr at 12/12/20 1000   sodium chloride     sodium chloride     sodium chloride      ceFAZolin (ANCEF) IV     PRN Meds: sodium chloride, acetaminophen, docusate, ondansetron (ZOFRAN) IV, oxyCODONE-acetaminophen, polyethylene glycol, sodium chloride flush, traMADol   Vital Signs    Vitals:   12/15/20 1504 12/15/20 2035 12/16/20 0537 12/16/20 0944  BP: (!) 144/71 (!) 146/69 (!) 150/66 138/68  Pulse: 86 91 89   Resp: 17 17 19 18   Temp: 97.9 F (36.6 C) 98.6 F (37 C) 97.8 F (36.6 C) 97.9 F (36.6 C)  TempSrc: Oral Oral Oral Oral  SpO2:   98%   Weight:   72.8 kg   Height:        Intake/Output Summary (Last 24 hours) at 12/16/2020 0955 Last data filed at 12/15/2020 1500 Gross per 24 hour  Intake  --  Output 500 ml  Net -500 ml   Filed Weights   12/14/20 0505 12/15/20 0358 12/16/20 0537  Weight: 72.3 kg 72.6 kg 72.8 kg    Telemetry    SR with frequent PVCs - Personally Reviewed  ECG    SR rate 83 borderline ant inf pattern, occasional PVC, QTC 590 - Personally Reviewed  Physical Exam   GEN: No acute distress.   Neck: No JVD Cardiac: IRIR, no murmurs, rubs, or gallops.  Respiratory: Clear to auscultation bilaterally. GI: Soft, nontender, non-distended  MS: No edema; No deformity. Neuro:  Nonfocal  Psych: Normal affect   Labs    Chemistry Recent Labs  Lab 12/14/20 0126 12/15/20 0051 12/16/20 0301  NA 134* 138 136  K 4.2 3.8 4.7  CL 104 105 102  CO2 24 27 31   GLUCOSE 376* 79 141*  BUN 16 14 15   CREATININE 1.03 0.86 1.09  CALCIUM 8.2* 8.7* 8.7*  PROT 5.3* 5.2* 5.5*  ALBUMIN 2.7* 2.5* 2.6*  AST 230* 196* 135*  ALT 210* 166* 140*  ALKPHOS 58 56 59  BILITOT 0.6 0.4 0.5  GFRNONAA >60 >60 >60  ANIONGAP 6 6 3*     Hematology  Recent Labs  Lab 12/14/20 0126 12/15/20 0051 12/16/20 0301  WBC 4.4 4.1 4.8  RBC 4.21* 4.19* 4.57  HGB 11.7* 11.9* 12.8*  HCT 35.9* 35.6* 39.1  MCV 85.3 85.0 85.6  MCH 27.8 28.4 28.0  MCHC 32.6 33.4 32.7  RDW 15.5 15.3 15.2  PLT 165 188 219    Cardiac EnzymesNo results for input(s): TROPONINI in the last 168 hours. No results for input(s): TROPIPOC in the last 168 hours.   BNPNo results for input(s): BNP, PROBNP in the last 168 hours.   DDimer No results for input(s): DDIMER in the last 168 hours.   Radiology    MR CARDIAC MORPHOLOGY W WO CONTRAST  Result Date: 12/15/2020 CLINICAL DATA:  Ventricular tachycardia (VT), sustained VF arrest EXAM: CARDIAC MRI TECHNIQUE: The patient was scanned on a 1.5 Tesla GE magnet. A dedicated cardiac coil was used. Functional imaging was done using Fiesta sequences. 2,3, and 4 chamber views were done to assess for RWMA's. Modified Simpson's rule using a short axis stack was used to  calculate an ejection fraction on a dedicated work [employer]. The patient received 28mL GADAVIST GADOBUTROL 1 MMOL/ML IV SOLN. After 10 minutes inversion recovery sequences were used to assess for infiltration and scar tissue. This examination is tailored for evaluation cardiac anatomy and function and provides very limited assessment of noncardiac structures, which are accordingly not evaluated during interpretation. If there is clinical concern for extracardiac pathology, further evaluation with CT imaging should be considered. FINDINGS: LEFT VENTRICLE: Moderately dilated left ventricular chamber size. Normal left ventricular wall thickness. Severely reduced left ventricular systolic function. LVEF = 32% Global hypokinesis. No myocardial edema, T2 = 49 msec Normal first pass perfusion. There is post contrast delayed myocardial enhancement. Mid myocardial stripe of delayed enhancement in the ventricular septum, and basal lateral wall. This is a nonspecific finding and can be seen in nonischemic cardiomyopathy. Normal T1 myocardial nulling kinetics suggest against a diagnosis of cardiac amyloidosis. ECV = 33% RIGHT VENTRICLE: Normal right ventricular chamber size. Normal right ventricular wall thickness. Mildly reduced right ventricular systolic function. RVEF = 44% Mild global hypokinesis. No post contrast delayed myocardial enhancement. ATRIA: Mild enlargement of left atrial size. Normal right atrial size. VALVES: Cannot be assessed in setting of artifact from arrhythmia. PERICARDIUM: Normal pericardium.  Small pericardial effusion. OTHER: Small bilateral pleural effusions R > L. MEASUREMENTS: Volumes and EF are approximate values in the setting of significant arrhythmia motion artifact. Left ventricle: LV male LV EF: 32% (Normal 56-78%) Absolute volumes: LV EDV: 226 mL (Normal 77-195 mL) LV ESV: 154 mL (Normal 19-72 mL) LV SV: 72 mL (Normal 51-133 mL) CO: 6.0 L/min (Normal 2.8-8.8 L/min)  Indexed volumes: LV EDV: 118 mL/sq-m (Normal 47-92 mL/sq-m) LV ESV: 80 mL/sq-m (Normal 13-30 mL/sq-m) LV SV: 38 mL/sq-m (Normal 32-62 mL/sq-m) CI: 3.15 L/min/sq-m (Normal 1.7-4.2 L/min/sq-m) Right ventricle: RV male RV EF: 44 % (Normal 47-74%) Absolute volumes: RV EDV: 191 mL (Normal 88-227 mL) RV ESV: 108 mL (Normal 23-103 mL) RV SV: 84 mL (Normal 52-138 mL) CO: 7.0 L/min (Normal 2.8-8.8 L/min) Indexed volumes: RV EDV: 100 mL/sq-m (Normal 55-105 mL/sq-m) RV ESV: 56 mL/sq-m (Normal 15-43 mL/sq-m) RV SV: 44 mL/sq-m (Normal 32-64 mL/sq-m) CI: 3.66 L/min/sq-m (Normal 1.7-4.2 L/min/sq-m) IMPRESSION: Arrhythmia artifact significantly impacts image quality and accuracy of volumetric assessment. 1. Severe left ventricular systolic dysfunction, LVEF 32% with moderate left ventricular chamber enlargement 2. Mild right ventricular systolic dysfunction, RVEF 44%, with normal right ventricular chamber size. 3.  No myocardial edema 4. Mid myocardial stripe of delayed enhancement in the ventricular septum and basal lateral wall. Nonspecific finding, seen in nonischemic cardiomyopathy. Findings consistent with nonischemic cardiomyopathy. No evidence of infiltrative, inflammatory process or prior infarct. Electronically Signed   By: Weston Brass M.D.   On: 12/15/2020 07:10    Cardiac Studies   Transthoracic Echocardiogram: Date: 12/11/20 Results:  1. Left ventricular ejection fraction, by estimation, is 40 to 45%. The  left ventricle has mildly decreased function. The left ventricle  demonstrates global hypokinesis. Left ventricular diastolic parameters  were normal.   2. Right ventricular systolic function is normal. The right ventricular  size is normal. There is normal pulmonary artery systolic pressure.   3. The mitral valve is normal in structure. Trivial mitral valve  regurgitation. No evidence of mitral stenosis.   4. The aortic valve is normal in structure. Aortic valve regurgitation is  not visualized. No  aortic stenosis is present.   5. The inferior vena cava is normal in size with greater than 50%  respiratory variability, suggesting right atrial pressure of 3 mmHg.   CT C/A/P: Date:12/10/20 Results: Coronary Calcium and bilateral effusions  IMPRESSION: 1. No acute intracranial abnormality. 2. Bilateral lower lobe findings may represent infection/inflammation (such as aspiration pneumonia. 3. Cholelithiasis with findings suggestive of acute cholecystitis. Recommend further evaluation with right upper quadrant ultrasound. 4. Marked circumferential urinary bladder wall thickening which likely is not fully accounted for by under distension. Differential diagnosis includes malignancy or infection. Correlate with urinalysis. 5. Left 2nd-7th acute rib fractures with the fifth rib minimally displaced. Nondisplaced right 3rd-5th rib fractures. Findings consistent with given history of CPR. No associated pneumothorax. 6. No excretion of intravenous contrast noted within the kidney on the delayed view. Recommend correlation with labs.    Left/Right Heart Catheterizations: Date: 12/12/20 Results:   Mid LAD lesion is 50% stenosed.   Mild to moderate nonobstructive CAD with calcification of the mid LAD with previously noted 50% stenosis not significantly changed.  Normal intermediate, circumflex, and RCA.   LVEDP 11 mmHg.   RECOMMENDATION: Medical therapy.   Patient Profile     60 y.o. male hx of Moderate nonobstructive CAD, former Cocaine abuse UDS negative on admission, HTN with DM, Mild to moderate MR who presents with VF arrest 9/31.  Complicated by 7 shocks, amiodarone load.  Improving transaminitis.  Planned for Device 12/16/20.  Assessment & Plan    VF arrest - EP following; planned for ICD today - no suggestive of CAD arrest or with obvious scar on CMR per report - Qtc elevated but on no Qtc prolonging medications - will uptirate Coreg today and amiodarone can be considered  if  Acute on Chronic HFrEF (recovered but now worsening in the setting of arrest - NYHA class I, Stage C, euvolemic, etiology unclear - coreg increase to 25 mg PO BID - continue Entresto 24/26  - will return SGLT2i home dose back  Transaminitis - Shock related suspected VS biliary, returned to statin (now high dose) without issues (started 12/12/20)  Moderate CAD - continue ASA,statin as above  For questions or updates, please contact [facility] Please consult [email or web address] for contact info under Cardiology/STEMI.      Signed, Christell Constant, MD  12/16/2020, 9:55 AM

## 2020-12-16 NOTE — Progress Notes (Signed)
Progress Note  Patient Name: James [name]. Date of Encounter: 12/16/2020  [facility] Cardiologist: Olga Millers, MD   Subjective   Only c/o being hungry!  Inpatient Medications    Scheduled Meds:  aspirin  81 mg Oral Daily   atorvastatin  80 mg Oral Daily   carvedilol  25 mg Oral BID WC   Chlorhexidine Gluconate Cloth  6 each Topical Daily   dapagliflozin propanediol  5 mg Oral Daily   gentamicin irrigation  80 mg Irrigation On Call   insulin aspart  0-15 Units Subcutaneous TID WC   insulin aspart  0-5 Units Subcutaneous QHS   insulin aspart  4 Units Subcutaneous TID WC   insulin glargine-yfgn  25 Units Subcutaneous Daily   lidocaine  1 patch Transdermal Q24H   mouth rinse  15 mL Mouth Rinse BID   pantoprazole  40 mg Oral Daily   polyethylene glycol  17 g Oral Daily   sacubitril-valsartan  1 tablet Oral BID   sodium chloride flush  3 mL Intravenous Q12H   sodium chloride flush  3 mL Intravenous Q12H   Continuous Infusions:  sodium chloride 10 mL/hr at 12/12/20 1000   sodium chloride     sodium chloride     sodium chloride      ceFAZolin (ANCEF) IV     PRN Meds: sodium chloride, acetaminophen, docusate, ondansetron (ZOFRAN) IV, oxyCODONE-acetaminophen, polyethylene glycol, sodium chloride flush, traMADol   Vital Signs    Vitals:   12/15/20 2035 12/16/20 0537 12/16/20 0944 12/16/20 0957  BP: (!) 146/69 (!) 150/66 138/68   Pulse: 91 89  97  Resp: 17 19 18    Temp: 98.6 F (37 C) 97.8 F (36.6 C) 97.9 F (36.6 C)   TempSrc: Oral Oral Oral   SpO2:  98%    Weight:  72.8 kg    Height:        Intake/Output Summary (Last 24 hours) at 12/16/2020 1034 Last data filed at 12/15/2020 1500 Gross per 24 hour  Intake --  Output 500 ml  Net -500 ml   Last 3 Weights 12/16/2020 12/15/2020 12/14/2020  Weight (lbs) 160 lb 6.4 oz 160 lb 1.6 oz 159 lb 4.8 oz  Weight (kg) 72.757 kg 72.621 kg 72.258 kg  Some encounter information is confidential and restricted. Go to  Review Flowsheets activity to see all data.      Telemetry    SR, frequent PVCs (2 morphologies), rare couplet - Personally Reviewed  ECG    No new EKGs - Personally Reviewed  Physical Exam   GEN: No acute distress.   Neck: No JVD Cardiac: RRR, no murmurs, rubs, or gallops.  Respiratory: CTA b/l. GI: Soft, nontender, non-distended  MS: No edema; No deformity. Neuro:  Nonfocal  Psych: Normal affect   Labs    High Sensitivity Troponin:   Recent Labs  Lab 12/10/20 2145 12/11/20 0029 12/11/20 0940 12/11/20 1142  TROPONINIHS 74* 465* 2,930* 2,837*      Chemistry Recent Labs  Lab 12/14/20 0126 12/15/20 0051 12/16/20 0301  NA 134* 138 136  K 4.2 3.8 4.7  CL 104 105 102  CO2 24 27 31   GLUCOSE 376* 79 141*  BUN 16 14 15   CREATININE 1.03 0.86 1.09  CALCIUM 8.2* 8.7* 8.7*  PROT 5.3* 5.2* 5.5*  ALBUMIN 2.7* 2.5* 2.6*  AST 230* 196* 135*  ALT 210* 166* 140*  ALKPHOS 58 56 59  BILITOT 0.6 0.4 0.5  GFRNONAA >60 >60 >60  ANIONGAP 6 6 3*     Hematology Recent Labs  Lab 12/14/20 0126 12/15/20 0051 12/16/20 0301  WBC 4.4 4.1 4.8  RBC 4.21* 4.19* 4.57  HGB 11.7* 11.9* 12.8*  HCT 35.9* 35.6* 39.1  MCV 85.3 85.0 85.6  MCH 27.8 28.4 28.0  MCHC 32.6 33.4 32.7  RDW 15.5 15.3 15.2  PLT 165 188 219    BNPNo results for input(s): BNP, PROBNP in the last 168 hours.   DDimer No results for input(s): DDIMER in the last 168 hours.   Radiology      Cardiac Studies   12/14/20: c.MRI IMPRESSION: Arrhythmia artifact significantly impacts image quality and accuracy of volumetric assessment.   1. Severe left ventricular systolic dysfunction, LVEF 32% with moderate left ventricular chamber enlargement   2. Mild right ventricular systolic dysfunction, RVEF 44%, with normal right ventricular chamber size.   3.  No myocardial edema   4. Mid myocardial stripe of delayed enhancement in the ventricular septum and basal lateral wall. Nonspecific finding, seen  in nonischemic cardiomyopathy.   Findings consistent with nonischemic cardiomyopathy. No evidence of infiltrative, inflammatory process or prior infarct.  12/12/20: LHC   Mid LAD lesion is 50% stenosed.   Mild to moderate nonobstructive CAD with calcification of the mid LAD with previously noted 50% stenosis not significantly changed.  Normal intermediate, circumflex, and RCA.   LVEDP 11 mmHg.   RECOMMENDATION: Medical therapy.      12/11/20: TTE IMPRESSIONS   1. Left ventricular ejection fraction, by estimation, is 40 to 45%. The  left ventricle has mildly decreased function. The left ventricle  demonstrates global hypokinesis. Left ventricular diastolic parameters  were normal.   2. Right ventricular systolic function is normal. The right ventricular  size is normal. There is normal pulmonary artery systolic pressure.   3. The mitral valve is normal in structure. Trivial mitral valve  regurgitation. No evidence of mitral stenosis.   4. The aortic valve is normal in structure. Aortic valve regurgitation is  not visualized. No aortic stenosis is present.   5. The inferior vena cava is normal in size with greater than 50%  respiratory variability, suggesting right atrial pressure of 3 mmHg.        10/14/2016: TTE Study Conclusions  - Left ventricle: The cavity size was normal. Wall thickness was    normal. Systolic function was normal. The estimated ejection    fraction was in the range of 55% to 60%. Wall motion was normal;    there were no regional wall motion abnormalities. Doppler    parameters are consistent with abnormal left ventricular    relaxation (grade 1 diastolic dysfunction).  - Aortic valve: There was no stenosis.  - Mitral valve: There was trivial regurgitation.  - Right ventricle: The cavity size was normal. Systolic function    was normal.  - Pulmonary arteries: No complete TR doppler jet so unable to    estimate PA systolic pressure.   Impressions:  -  Normal LV size with EF 55-60%. Normal RV size and systolic    function. No significant valvular abnormalities.      04/30/2017: LHC Conclusions: Moderate mid LAD stenosis (50%) with otherwise minimal CAD, consistent with nonischemic cardiomyopathy. Normal right heart filling pressures. Upper normal to mildly elevated left heart filling pressures. Mild pulmonary hypertension. Low normal to mildly decreased Fick cardiac output/index.   Recommendations: Continue medical therapy for acute systolic and diastolic heart failure. Medical management of nonobstructive CAD, including statin therapy to  prevent progression of disease.       04/27/2016: TTE Study Conclusions  - Left ventricle: The cavity size was mildly dilated. Wall    thickness was normal. Systolic function was severely reduced. The    estimated ejection fraction was in the range of 20% to 25%.    Diffuse hypokinesis. There was fusion of early and atrial    contributions to ventricular filling. Doppler parameters are    consistent with a reversible restrictive pattern, indicative of    decreased left ventricular diastolic compliance and/or increased    left atrial pressure (grade 3 diastolic dysfunction).  - Mitral valve: There was mild to moderate regurgitation.  - Pulmonary arteries: Systolic pressure was moderately increased.    PA peak pressure: 48 mm Hg (S).  - Pericardium, extracardiac: A trivial pericardial effusion was    identified.   Patient Profile     60 y.o. male with a hx of NICM (2019 with recovered LVEF), HTN, DM, hx of cocaine abuse (in recovery) admitted 12/10/20 with OOH cardiac arrest  EMS record reviewed FD 1st to scene, found unresponsive AED applied and advised/received 2 shocks EMS subsequently Defibrillated him a total of 7 Meds: 5 rounds of Epi Amio 300 and 150 CPR by family > continued by FR and EMS Intubated in the field Tracings reviewed VF > SR  Tox only + for benzos, got midazolam by  EMS w/intubation  TTE with LVEF 40-45% LHC without obstructive CAD C.MRI c/w NICM, LVEF 32%   Assessment & Plan    1.  VT/VF cardiac arrest  2.  Chronic systolic heart failure:  Ejection fraction 40 to 45%.  No obvious volume overload.  cardiology team is managing CM/HF medicines/titration   3.  Cardiogenic shock resolved.   4.  Cocaine abuse:  Has been abstinent.  Tox screen on admission and negative for cocaine.   5.  Type 2 diabetes:  Hemoglobin A1c of 10.7.   Started on farxiga   6.  Elevated transaminases:  Found in the setting of cardiac arrest.   Admission CT scan noted Cholelithiasis with findings suggestive of acute cholecystitis. Ultrasound found an edematous gallbladder wall without evidence of cholelithiasis or acute cholecystitis Numbers are slowly recovering. HIDA scan this morning is completed, pending result   Revisited rational for ICD Implant with the patient and his wife at bedside (she is [employer] and uses a English as a second language teacher that she reads) Discussed the implant procedure and potential risks/benefits. They remain agreeable to proceed with ICD implant LVEF is depressed, no pacing needs, narrow QRS, I anticipate single chamber device.  Discussed no driving 59mo  Await HIDA scan result if negative, planned for ICD implant this afternoon Dr. Lalla Brothers will see later today    For questions or updates, please contact [facility] Please consult [email or web address] for contact info under        Signed, Sheilah Pigeon, PA-C  12/16/2020, 10:34 AM

## 2020-12-16 NOTE — Progress Notes (Signed)
Inpatient Diabetes Program Recommendations  AACE/ADA: New Consensus Statement on Inpatient Glycemic Control (2015)  Target Ranges:  Prepandial:   less than 140 mg/dL      Peak postprandial:   less than 180 mg/dL (1-2 hours)      Critically ill patients:  140 - 180 mg/dL   Lab Results  Component Value Date   GLUCAP 88 12/16/2020   HGBA1C 10.7 (H) 12/11/2020    Review of Glycemic Control  Diabetes history: DM 2  Outpatient Diabetes medications: Farxiga 5 mg Daily, Lantus 14 units, Metformin 1000 mg bid Current orders for Inpatient glycemic control:   A1c 10.7% on 9/1  Spoke with pt regarding A1c level and glucose control at home. Pt reports checking glucose in the mornings and at most will see levels up to 160's. Pt reports taking his meds all the time. Discussed current A1c level of 10.7%. Discussed glucose and A1c goals. Pt wanted to review dietary changes and requested information for home that I will attach to AVS. Suggested to pt to check his glucose later in the day in addition to the morning to see how high the numbers go throughout the day. Encouraged close follow up with PCP.  Thanks,  Christena Deem RN, MSN, BC-ADM Inpatient Diabetes Coordinator Team Pager [phone] (8a-5p)

## 2020-12-16 NOTE — Progress Notes (Signed)
Pt CBG at 56, symptomatic with dizziness, sweaty and c/o of weakness. Pt also was jitterry. 1 amp  of d50 given . Janean Sark  PA-C updated with cbg and intervention. Current CBG at 142 post D50 IV push.

## 2020-12-17 ENCOUNTER — Other Ambulatory Visit (HOSPITAL_COMMUNITY): Payer: Self-pay

## 2020-12-17 ENCOUNTER — Inpatient Hospital Stay (HOSPITAL_COMMUNITY): Payer: Self-pay

## 2020-12-17 DIAGNOSIS — I5043 Acute on chronic combined systolic (congestive) and diastolic (congestive) heart failure: Secondary | ICD-10-CM

## 2020-12-17 LAB — CBC
HCT: 38 % — ABNORMAL LOW (ref 39.0–52.0)
Hemoglobin: 13.1 g/dL (ref 13.0–17.0)
MCH: 28.7 pg (ref 26.0–34.0)
MCHC: 34.5 g/dL (ref 30.0–36.0)
MCV: 83.2 fL (ref 80.0–100.0)
Platelets: 235 10*3/uL (ref 150–400)
RBC: 4.57 MIL/uL (ref 4.22–5.81)
RDW: 15.2 % (ref 11.5–15.5)
WBC: 5.1 10*3/uL (ref 4.0–10.5)
nRBC: 0 % (ref 0.0–0.2)

## 2020-12-17 LAB — COMPREHENSIVE METABOLIC PANEL
ALT: 113 U/L — ABNORMAL HIGH (ref 0–44)
AST: 100 U/L — ABNORMAL HIGH (ref 15–41)
Albumin: 2.4 g/dL — ABNORMAL LOW (ref 3.5–5.0)
Alkaline Phosphatase: 52 U/L (ref 38–126)
Anion gap: 7 (ref 5–15)
BUN: 18 mg/dL (ref 8–23)
CO2: 28 mmol/L (ref 22–32)
Calcium: 8.6 mg/dL — ABNORMAL LOW (ref 8.9–10.3)
Chloride: 102 mmol/L (ref 98–111)
Creatinine, Ser: 1 mg/dL (ref 0.61–1.24)
GFR, Estimated: >60 mL/min (ref >60)
Glucose, Bld: 74 mg/dL (ref 70–99)
Potassium: 3.7 mmol/L (ref 3.5–5.1)
Sodium: 137 mmol/L (ref 135–145)
Total Bilirubin: 0.3 mg/dL (ref 0.3–1.2)
Total Protein: 5.2 g/dL — ABNORMAL LOW (ref 6.5–8.1)

## 2020-12-17 LAB — GLUCOSE, CAPILLARY
Glucose-Capillary: 98 mg/dL (ref 70–99)
Glucose-Capillary: 171 mg/dL — ABNORMAL HIGH (ref 70–99)

## 2020-12-17 MED ORDER — CARVEDILOL 25 MG PO TABS
25.0000 mg | ORAL_TABLET | Freq: Two times a day (BID) | ORAL | 0 refills | Status: DC
  Filled 2020-12-17: qty 60, 30d supply, fill #0

## 2020-12-17 MED ORDER — SACUBITRIL-VALSARTAN 24-26 MG PO TABS
1.0000 | ORAL_TABLET | Freq: Two times a day (BID) | ORAL | 0 refills | Status: DC
  Filled 2020-12-17: qty 60, 30d supply, fill #0

## 2020-12-17 MED ORDER — SPIRONOLACTONE 12.5 MG HALF TABLET
12.5000 mg | ORAL_TABLET | Freq: Every day | ORAL | Status: DC
  Administered 2020-12-17: 12.5 mg via ORAL
  Filled 2020-12-17: qty 1

## 2020-12-17 MED ORDER — SPIRONOLACTONE 25 MG PO TABS
12.5000 mg | ORAL_TABLET | Freq: Every day | ORAL | 0 refills | Status: DC
  Filled 2020-12-17: qty 30, 60d supply, fill #0

## 2020-12-17 MED ORDER — INSULIN GLARGINE-YFGN 100 UNIT/ML ~~LOC~~ SOLN
10.0000 [IU] | Freq: Every day | SUBCUTANEOUS | Status: DC
  Administered 2020-12-17: 10 [IU] via SUBCUTANEOUS
  Filled 2020-12-17: qty 0.1

## 2020-12-17 NOTE — Progress Notes (Addendum)
Progress Note  Patient Name: James [name]. Date of Encounter: 12/17/2020  [facility] Cardiologist: Olga Millers, MD   Subjective   Feels well, only a slight ache at implant site, no CP, no SOB  Inpatient Medications    Scheduled Meds:  aspirin  81 mg Oral Daily   atorvastatin  80 mg Oral Daily   carvedilol  25 mg Oral BID WC   Chlorhexidine Gluconate Cloth  6 each Topical Daily   dapagliflozin propanediol  5 mg Oral Daily   insulin aspart  0-15 Units Subcutaneous TID WC   insulin aspart  0-5 Units Subcutaneous QHS   insulin glargine-yfgn  10 Units Subcutaneous Daily   lidocaine  1 patch Transdermal Q24H   mouth rinse  15 mL Mouth Rinse BID   pantoprazole  40 mg Oral Daily   polyethylene glycol  17 g Oral Daily   sacubitril-valsartan  1 tablet Oral BID   sodium chloride flush  3 mL Intravenous Q12H   sodium chloride flush  3 mL Intravenous Q12H   spironolactone  12.5 mg Oral Daily   Continuous Infusions:  sodium chloride 10 mL/hr at 12/12/20 1000   sodium chloride     PRN Meds: sodium chloride, acetaminophen, docusate, ondansetron (ZOFRAN) IV, oxyCODONE-acetaminophen, polyethylene glycol, sodium chloride flush, traMADol   Vital Signs    Vitals:   12/16/20 1738 12/16/20 1831 12/16/20 2056 12/17/20 0446  BP:  (!) 150/101 110/70 128/76  Pulse:  100 86 84  Resp:   16 16  Temp:   98.2 F (36.8 C) 98.3 F (36.8 C)  TempSrc:   Oral Oral  SpO2: 99%  97%   Weight:    72.3 kg  Height:        Intake/Output Summary (Last 24 hours) at 12/17/2020 0957 Last data filed at 12/17/2020 0600 Gross per 24 hour  Intake 320 ml  Output --  Net 320 ml   Last 3 Weights 12/17/2020 12/16/2020 12/16/2020  Weight (lbs) 159 lb 6.4 oz 160 lb 6.5 oz 160 lb 6.4 oz  Weight (kg) 72.303 kg 72.76 kg 72.757 kg  Some encounter information is confidential and restricted. Go to Review Flowsheets activity to see all data.      Telemetry    Unchanged, SR, frequent PVCs (2 morphologies),  rare couplet - Personally Reviewed  ECG    SR, PACs, PVCs- Personally Reviewed  Physical Exam   GEN: No acute distress.   Neck: No JVD Cardiac: RRR, no murmurs, rubs, or gallops.  Respiratory: CTA b/l. GI: Soft, nontender, non-distended  MS: No edema; No deformity. Neuro:  Nonfocal  Psych: Normal affect   L chest: site is stable, no bleeding or hematoma  Labs    High Sensitivity Troponin:   Recent Labs  Lab 12/10/20 2145 12/11/20 0029 12/11/20 0940 12/11/20 1142  TROPONINIHS 74* 465* 2,930* 2,837*      Chemistry Recent Labs  Lab 12/15/20 0051 12/16/20 0301 12/17/20 0207  NA 138 136 137  K 3.8 4.7 3.7  CL 105 102 102  CO2 27 31 28   GLUCOSE 79 141* 74  BUN 14 15 18   CREATININE 0.86 1.09 1.00  CALCIUM 8.7* 8.7* 8.6*  PROT 5.2* 5.5* 5.2*  ALBUMIN 2.5* 2.6* 2.4*  AST 196* 135* 100*  ALT 166* 140* 113*  ALKPHOS 56 59 52  BILITOT 0.4 0.5 0.3  GFRNONAA >60 >60 >60  ANIONGAP 6 3* 7     Hematology Recent Labs  Lab 12/15/20 0051 12/16/20  0301 12/17/20 0207  WBC 4.1 4.8 5.1  RBC 4.19* 4.57 4.57  HGB 11.9* 12.8* 13.1  HCT 35.6* 39.1 38.0*  MCV 85.0 85.6 83.2  MCH 28.4 28.0 28.7  MCHC 33.4 32.7 34.5  RDW 15.3 15.2 15.2  PLT 188 219 235    BNPNo results for input(s): BNP, PROBNP in the last 168 hours.   DDimer No results for input(s): DDIMER in the last 168 hours.   Radiology      Cardiac Studies   12/14/20: c.MRI IMPRESSION: Arrhythmia artifact significantly impacts image quality and accuracy of volumetric assessment.   1. Severe left ventricular systolic dysfunction, LVEF 32% with moderate left ventricular chamber enlargement   2. Mild right ventricular systolic dysfunction, RVEF 44%, with normal right ventricular chamber size.   3.  No myocardial edema   4. Mid myocardial stripe of delayed enhancement in the ventricular septum and basal lateral wall. Nonspecific finding, seen in nonischemic cardiomyopathy.   Findings consistent with  nonischemic cardiomyopathy. No evidence of infiltrative, inflammatory process or prior infarct.  12/12/20: LHC   Mid LAD lesion is 50% stenosed.   Mild to moderate nonobstructive CAD with calcification of the mid LAD with previously noted 50% stenosis not significantly changed.  Normal intermediate, circumflex, and RCA.   LVEDP 11 mmHg.   RECOMMENDATION: Medical therapy.      12/11/20: TTE IMPRESSIONS   1. Left ventricular ejection fraction, by estimation, is 40 to 45%. The  left ventricle has mildly decreased function. The left ventricle  demonstrates global hypokinesis. Left ventricular diastolic parameters  were normal.   2. Right ventricular systolic function is normal. The right ventricular  size is normal. There is normal pulmonary artery systolic pressure.   3. The mitral valve is normal in structure. Trivial mitral valve  regurgitation. No evidence of mitral stenosis.   4. The aortic valve is normal in structure. Aortic valve regurgitation is  not visualized. No aortic stenosis is present.   5. The inferior vena cava is normal in size with greater than 50%  respiratory variability, suggesting right atrial pressure of 3 mmHg.        10/14/2016: TTE Study Conclusions  - Left ventricle: The cavity size was normal. Wall thickness was    normal. Systolic function was normal. The estimated ejection    fraction was in the range of 55% to 60%. Wall motion was normal;    there were no regional wall motion abnormalities. Doppler    parameters are consistent with abnormal left ventricular    relaxation (grade 1 diastolic dysfunction).  - Aortic valve: There was no stenosis.  - Mitral valve: There was trivial regurgitation.  - Right ventricle: The cavity size was normal. Systolic function    was normal.  - Pulmonary arteries: No complete TR doppler jet so unable to    estimate PA systolic pressure.   Impressions:  - Normal LV size with EF 55-60%. Normal RV size and systolic     function. No significant valvular abnormalities.      04/30/2017: LHC Conclusions: Moderate mid LAD stenosis (50%) with otherwise minimal CAD, consistent with nonischemic cardiomyopathy. Normal right heart filling pressures. Upper normal to mildly elevated left heart filling pressures. Mild pulmonary hypertension. Low normal to mildly decreased Fick cardiac output/index.   Recommendations: Continue medical therapy for acute systolic and diastolic heart failure. Medical management of nonobstructive CAD, including statin therapy to prevent progression of disease.       04/27/2016: TTE Study Conclusions  -  Left ventricle: The cavity size was mildly dilated. Wall    thickness was normal. Systolic function was severely reduced. The    estimated ejection fraction was in the range of 20% to 25%.    Diffuse hypokinesis. There was fusion of early and atrial    contributions to ventricular filling. Doppler parameters are    consistent with a reversible restrictive pattern, indicative of    decreased left ventricular diastolic compliance and/or increased    left atrial pressure (grade 3 diastolic dysfunction).  - Mitral valve: There was mild to moderate regurgitation.  - Pulmonary arteries: Systolic pressure was moderately increased.    PA peak pressure: 48 mm Hg (S).  - Pericardium, extracardiac: A trivial pericardial effusion was    identified.   Patient Profile     60 y.o. male with a hx of NICM (2019 with recovered LVEF), HTN, DM, hx of cocaine abuse (in recovery) admitted 12/10/20 with OOH cardiac arrest  EMS record reviewed FD 1st to scene, found unresponsive AED applied and advised/received 2 shocks EMS subsequently Defibrillated him a total of 7 Meds: 5 rounds of Epi Amio 300 and 150 CPR by family > continued by FR and EMS Intubated in the field Tracings reviewed VF > SR  Tox only + for benzos, got midazolam by EMS w/intubation  TTE with LVEF 40-45% LHC without  obstructive CAD C.MRI c/w NICM, LVEF 32%   Assessment & Plan    1.  VT/VF cardiac arrest  2.  Chronic systolic heart failure:  Ejection fraction 40 to 45%.   No obvious volume overload.   cardiology team is managing CM/HF medicines/titration  S/p ICD implant yesterday Site is stable Device check this Am with stable measurements CXR this Am with stable lead position, no description of ptx Wound care and activity restrictions were discussed with the patient Routine post implant EP follow up is in place  Shock plan reviewd yesterday with patient and his wife We re-discussed again today no driving 6 mo     3.  Cardiogenic shock resolved.   4.  Cocaine abuse:  Has been abstinent.  Tox screen on admission and negative for cocaine.   5.  Type 2 diabetes:  Hemoglobin A1c of 10.7.   Started on farxiga Hypoglycemic episode yesterday Management with IM team   6.  Elevated transaminases:  Found in the setting of cardiac arrest.   Admission CT scan noted Cholelithiasis with findings suggestive of acute cholecystitis. Ultrasound found an edematous gallbladder wall without evidence of cholelithiasis or acute cholecystitis Numbers are slowly recovering. HIDA neg    Dr. Elberta Fortis has seen and examined the patient OK to discharge from out perspective when ready clinically otherwise EP Kash Davie sign off though remain available, please recall if needed    For questions or updates, please contact [facility] Please consult [email or web address] for contact info under        Signed, Sheilah Pigeon, PA-C  12/17/2020, 9:57 AM    I have seen and examined this patient with Francis Dowse.  Agree with above, note added to reflect my findings.  On exam, RRR, nomurmurs.  She is now status post [employer] ICD for VT/VF arrest.  Device functioning appropriately.  Chest x-ray and interrogation without issue.  EP to sign off with follow-up in device clinic.  Novice Vrba M. Phinehas Grounds MD 12/17/2020 11:57 AM

## 2020-12-17 NOTE — Plan of Care (Signed)
  Problem: Safety: Goal: Non-violent Restraint(s) 12/17/2020 1052 by Ancil Linsey, RN Outcome: Adequate for Discharge 12/17/2020 1052 by Ancil Linsey, RN Outcome: Adequate for Discharge   Problem: Education: Goal: Knowledge of General Education information will improve Description: Including pain rating scale, medication(s)/side effects and non-pharmacologic comfort measures 12/17/2020 1052 by Ancil Linsey, RN Outcome: Adequate for Discharge 12/17/2020 1052 by Ancil Linsey, RN Outcome: Adequate for Discharge   Problem: Health Behavior/Discharge Planning: Goal: Ability to manage health-related needs will improve 12/17/2020 1052 by Ancil Linsey, RN Outcome: Adequate for Discharge 12/17/2020 1052 by Ancil Linsey, RN Outcome: Adequate for Discharge   Problem: Clinical Measurements: Goal: Ability to maintain clinical measurements within normal limits will improve 12/17/2020 1052 by Ancil Linsey, RN Outcome: Adequate for Discharge 12/17/2020 1052 by Ancil Linsey, RN Outcome: Adequate for Discharge Goal: Will remain free from infection 12/17/2020 1052 by Ancil Linsey, RN Outcome: Adequate for Discharge 12/17/2020 1052 by Ancil Linsey, RN Outcome: Adequate for Discharge Goal: Diagnostic test results will improve 12/17/2020 1052 by Ancil Linsey, RN Outcome: Adequate for Discharge 12/17/2020 1052 by Ancil Linsey, RN Outcome: Adequate for Discharge Goal: Respiratory complications will improve 12/17/2020 1052 by Ancil Linsey, RN Outcome: Adequate for Discharge 12/17/2020 1052 by Ancil Linsey, RN Outcome: Adequate for Discharge Goal: Cardiovascular complication will be avoided 12/17/2020 1052 by Ancil Linsey, RN Outcome: Adequate for Discharge 12/17/2020 1052 by Ancil Linsey, RN Outcome: Adequate for Discharge   Problem: Activity: Goal: Risk for activity intolerance will decrease 12/17/2020 1052 by  Ancil Linsey, RN Outcome: Adequate for Discharge 12/17/2020 1052 by Ancil Linsey, RN Outcome: Adequate for Discharge   Problem: Nutrition: Goal: Adequate nutrition will be maintained 12/17/2020 1052 by Ancil Linsey, RN Outcome: Adequate for Discharge 12/17/2020 1052 by Ancil Linsey, RN Outcome: Adequate for Discharge   Problem: Coping: Goal: Level of anxiety will decrease 12/17/2020 1052 by Ancil Linsey, RN Outcome: Adequate for Discharge 12/17/2020 1052 by Ancil Linsey, RN Outcome: Adequate for Discharge   Problem: Elimination: Goal: Will not experience complications related to bowel motility 12/17/2020 1052 by Ancil Linsey, RN Outcome: Adequate for Discharge 12/17/2020 1052 by Ancil Linsey, RN Outcome: Adequate for Discharge Goal: Will not experience complications related to urinary retention 12/17/2020 1052 by Ancil Linsey, RN Outcome: Adequate for Discharge 12/17/2020 1052 by Ancil Linsey, RN Outcome: Adequate for Discharge   Problem: Pain Managment: Goal: General experience of comfort will improve 12/17/2020 1052 by Ancil Linsey, RN Outcome: Adequate for Discharge 12/17/2020 1052 by Ancil Linsey, RN Outcome: Adequate for Discharge   Problem: Safety: Goal: Ability to remain free from injury will improve 12/17/2020 1052 by Ancil Linsey, RN Outcome: Adequate for Discharge 12/17/2020 1052 by Ancil Linsey, RN Outcome: Adequate for Discharge   Problem: Skin Integrity: Goal: Risk for impaired skin integrity will decrease 12/17/2020 1052 by Ancil Linsey, RN Outcome: Adequate for Discharge 12/17/2020 1052 by Ancil Linsey, RN Outcome: Adequate for Discharge

## 2020-12-17 NOTE — Progress Notes (Signed)
Progress Note  Patient Name: James [name]. Date of Encounter: 12/17/2020  Primary Cardiologist: Olga Millers, MD   Subjective   S/p ICD.  Had hypoglycemia while NPO. Presently feels no pain or palpitations.  Has remote monitor at bedside.  No issues with ambulation.  Inpatient Medications    Scheduled Meds:  aspirin  81 mg Oral Daily   atorvastatin  80 mg Oral Daily   carvedilol  25 mg Oral BID WC   Chlorhexidine Gluconate Cloth  6 each Topical Daily   dapagliflozin propanediol  5 mg Oral Daily   insulin aspart  0-15 Units Subcutaneous TID WC   insulin aspart  0-5 Units Subcutaneous QHS   insulin aspart  4 Units Subcutaneous TID WC   insulin glargine-yfgn  25 Units Subcutaneous Daily   lidocaine  1 patch Transdermal Q24H   mouth rinse  15 mL Mouth Rinse BID   pantoprazole  40 mg Oral Daily   polyethylene glycol  17 g Oral Daily   sacubitril-valsartan  1 tablet Oral BID   sodium chloride flush  3 mL Intravenous Q12H   sodium chloride flush  3 mL Intravenous Q12H   Continuous Infusions:  sodium chloride 10 mL/hr at 12/12/20 1000   sodium chloride     PRN Meds: sodium chloride, acetaminophen, docusate, ondansetron (ZOFRAN) IV, oxyCODONE-acetaminophen, polyethylene glycol, sodium chloride flush, traMADol   Vital Signs    Vitals:   12/16/20 1738 12/16/20 1831 12/16/20 2056 12/17/20 0446  BP:  (!) 150/101 110/70 128/76  Pulse:  100 86 84  Resp:   16 16  Temp:   98.2 F (36.8 C) 98.3 F (36.8 C)  TempSrc:   Oral Oral  SpO2: 99%  97%   Weight:    72.3 kg  Height:        Intake/Output Summary (Last 24 hours) at 12/17/2020 0714 Last data filed at 12/17/2020 0600 Gross per 24 hour  Intake 320 ml  Output --  Net 320 ml   Filed Weights   12/16/20 0537 12/16/20 1342 12/17/20 0446  Weight: 72.8 kg 72.8 kg 72.3 kg    Telemetry    Sr with Frequent PVCs; [facility] alarm is just frequent PVCs- Personally Reviewed  ECG    SR rate 92 rare PVCs and PACs QTC 460  ms- Personally Reviewed  Physical Exam   GEN: No acute distress.   Neck: No HVD Cardiac: RRR, no murmurs, rubs, or gallops.  Respiratory: Clear to auscultation bilaterally. GI: Soft, nontender, non-distended  MS: No edema; No deformity. Neuro:  Nonfocal  Psych: Normal affect   Labs    Chemistry Recent Labs  Lab 12/15/20 0051 12/16/20 0301 12/17/20 0207  NA 138 136 137  K 3.8 4.7 3.7  CL 105 102 102  CO2 27 31 28   GLUCOSE 79 141* 74  BUN 14 15 18   CREATININE 0.86 1.09 1.00  CALCIUM 8.7* 8.7* 8.6*  PROT 5.2* 5.5* 5.2*  ALBUMIN 2.5* 2.6* 2.4*  AST 196* 135* 100*  ALT 166* 140* 113*  ALKPHOS 56 59 52  BILITOT 0.4 0.5 0.3  GFRNONAA >60 >60 >60  ANIONGAP 6 3* 7     Hematology Recent Labs  Lab 12/15/20 0051 12/16/20 0301 12/17/20 0207  WBC 4.1 4.8 5.1  RBC 4.19* 4.57 4.57  HGB 11.9* 12.8* 13.1  HCT 35.6* 39.1 38.0*  MCV 85.0 85.6 83.2  MCH 28.4 28.0 28.7  MCHC 33.4 32.7 34.5  RDW 15.3 15.2 15.2  PLT 188 219  235    Cardiac EnzymesNo results for input(s): TROPONINI in the last 168 hours. No results for input(s): TROPIPOC in the last 168 hours.   BNPNo results for input(s): BNP, PROBNP in the last 168 hours.   DDimer No results for input(s): DDIMER in the last 168 hours.   Radiology    NM Hepato W/EF  Result Date: 12/16/2020 CLINICAL DATA:  Right upper quadrant abdominal pain. EXAM: NUCLEAR MEDICINE HEPATOBILIARY IMAGING WITH GALLBLADDER EF TECHNIQUE: Sequential images of the abdomen were obtained out to 60 minutes following intravenous administration of radiopharmaceutical. After oral ingestion of Ensure, gallbladder ejection fraction was determined. At 60 min, normal ejection fraction is greater than 33%. RADIOPHARMACEUTICALS:  5.2 mCi Tc-84m  Choletec IV COMPARISON:  None. FINDINGS: Prompt uptake and biliary excretion of activity by the liver is seen. Gallbladder activity is visualized, consistent with patency of cystic duct. Biliary activity passes into  small bowel, consistent with patent common bile duct. Calculated gallbladder ejection fraction is 60%. (Normal gallbladder ejection fraction with Ensure is greater than 33%.) IMPRESSION: Normal gallbladder ejection fracture after Ensure administration. Electronically Signed   By: Lupita Raider M.D.   On: 12/16/2020 14:11    Cardiac Studies   Transthoracic Echocardiogram: Date: 12/11/20 Results:  1. Left ventricular ejection fraction, by estimation, is 40 to 45%. The  left ventricle has mildly decreased function. The left ventricle  demonstrates global hypokinesis. Left ventricular diastolic parameters  were normal.   2. Right ventricular systolic function is normal. The right ventricular  size is normal. There is normal pulmonary artery systolic pressure.   3. The mitral valve is normal in structure. Trivial mitral valve  regurgitation. No evidence of mitral stenosis.   4. The aortic valve is normal in structure. Aortic valve regurgitation is  not visualized. No aortic stenosis is present.   5. The inferior vena cava is normal in size with greater than 50%  respiratory variability, suggesting right atrial pressure of 3 mmHg.   CT C/A/P: Date:12/10/20 Results: Coronary Calcium and bilateral effusions  IMPRESSION: 1. No acute intracranial abnormality. 2. Bilateral lower lobe findings may represent infection/inflammation (such as aspiration pneumonia. 3. Cholelithiasis with findings suggestive of acute cholecystitis. Recommend further evaluation with right upper quadrant ultrasound. 4. Marked circumferential urinary bladder wall thickening which likely is not fully accounted for by under distension. Differential diagnosis includes malignancy or infection. Correlate with urinalysis. 5. Left 2nd-7th acute rib fractures with the fifth rib minimally displaced. Nondisplaced right 3rd-5th rib fractures. Findings consistent with given history of CPR. No associated pneumothorax. 6. No  excretion of intravenous contrast noted within the kidney on the delayed view. Recommend correlation with labs.    Left/Right Heart Catheterizations: Date: 12/12/20 Results:   Mid LAD lesion is 50% stenosed.   Mild to moderate nonobstructive CAD with calcification of the mid LAD with previously noted 50% stenosis not significantly changed.  Normal intermediate, circumflex, and RCA.   LVEDP 11 mmHg.   RECOMMENDATION: Medical therapy.   Patient Profile     60 y.o. male hx of Moderate nonobstructive CAD, former Cocaine abuse UDS negative on admission, HTN with DM, Mild to moderate MR who presents with VF arrest 9/31.  Complicated by 7 shocks, amiodarone load.  Improving transaminitis.  Planned for Device 12/16/20.  Assessment & Plan    VF arrest - s/p ICD - no suggestive of CAD arrest or with obvious scar on CMR per report - Qtc elevated but on no Qtc prolonging medications - BB  Acute on Chronic HFrEF (recovered but now worsening in the setting of arrest - NYHA class I, Stage C, euvolemic, etiology unclear - coreg at 25 mg PO BID - continue Entresto 24/26  - on home Farxiga  Transaminitis; improving - Shock related suspected - normal HIDA scan - returned to statin (high dose this admission) without issues (started 12/12/20)  Moderate CAD - continue ASA, statin as above  Nearing hospital discharge: will work today to arrange follow up  Patient would like to get social work involved for help with insurance coverage; would like to get his COVID-19 booster.  No concerns about this from a cardiology standpoint  For questions or updates, please contact [facility] Please consult [email or web address] for contact info under Cardiology/STEMI.      Signed, Christell Constant, MD  12/17/2020, 7:14 AM

## 2020-12-17 NOTE — Progress Notes (Signed)
Physical Therapy Treatment & Discharge Patient Details Name: James [name]. MRN: [id number] DOB: 1960/05/07 Today's Date: 12/17/2020    History of Present Illness 60 yo male presenting 8/31 after cardiac arrest sustained at home. Pt recieved 4 rounds of epinephrine, multiple rounds of chest compressions, and x7 rounds of defibrillation prior to [place] upon arrival to ED. S/p ICD 9/6. 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    PT Comments    Focused session on providing and educating pt on handout of ICD precautions. Educated pt on restrictions and sling application. Pt's balance and gait appear WFL and pt has met his PT goals. All education completed and questions answered. PT will sign off.   Follow Up Recommendations  No PT follow up;Supervision - Intermittent     Equipment Recommendations  None recommended by PT    Recommendations for Other Services       Precautions / Restrictions Precautions Precautions: Other (comment);ICD/Pacemaker Precaution Comments: monitor HR; s/p ICD 9/6 Restrictions Weight Bearing Restrictions: No    Mobility  Bed Mobility Overal bed mobility: Modified Independent Bed Mobility: Supine to Sit;Sit to Supine     Supine to sit: Modified independent (Device/Increase time)     General bed mobility comments: Pt transition supine > sit with mod I HOB elevated. Exits/enters from L side and displayed sligth pain/difficulty coming to rise from ICD placement on his L yesterday. Educated pt not to roll or lay on his L side, followed cues appropriately without needing reminders throughout session.    Transfers Overall transfer level: Independent Equipment used: None Transfers: Sit to/from Stand Sit to Stand: Independent         General transfer comment: No LOB or unsteadiness noted with transferring to stand from EOB.  Ambulation/Gait Ambulation/Gait assistance: Supervision    Assistive device: None Gait Pattern/deviations: WFL(Within Functional Limits) Gait velocity: decreased Gait velocity interpretation: >4.37 ft/sec, indicative of normal walking speed (when cued to increase speed) General Gait Details: Pt with no LOB with changes in gait speed, changes in head position, or turning. supervision for safety but could do independently.   Stairs             Wheelchair Mobility    Modified Rankin (Stroke Patients Only)       Balance Overall balance assessment: Needs assistance Sitting-balance support: No upper extremity supported;Feet supported Sitting balance-Leahy Scale: Normal     Standing balance support: No upper extremity supported Standing balance-Leahy Scale: Normal Standing balance comment: Able to perform gait challenges without LOB or assistance.                            Cognition Arousal/Alertness: Awake/alert Behavior During Therapy: WFL for tasks assessed/performed Overall Cognitive Status: Within Functional Limits for tasks assessed                                 General Comments: Pt is A&Ox4 and is able to recall ICD placement yesterday. He appears to be aware of his safety.      Exercises      General Comments General comments (skin integrity, edema, etc.): HR up to 110s, pt denied any symptoms throughout; provided pt with handout on ICD precautions and educated pt on it      Pertinent Vitals/Pain Pain Assessment: Faces Faces Pain Scale: Hurts a little bit  Pain Location: location of ICD L shoulder Pain Descriptors / Indicators: Sore;Tightness Pain Intervention(s): Limited activity within patient's tolerance;Monitored during session;Repositioned    Home Living                      Prior Function            PT Goals (current goals can now be found in the care plan section) Acute Rehab PT Goals Patient Stated Goal: return home and to independence PT Goal Formulation: All  assessment and education complete, DC therapy Time For Goal Achievement: 12/26/20 Potential to Achieve Goals: Good Progress towards PT goals: Goals met/education completed, patient discharged from PT    Frequency    Min 3X/week      PT Plan      Co-evaluation              AM-PAC PT "6 Clicks" Mobility   Outcome Measure  Help needed turning from your back to your side while in a flat bed without using bedrails?: None Help needed moving from lying on your back to sitting on the side of a flat bed without using bedrails?: None Help needed moving to and from a bed to a chair (including a wheelchair)?: None Help needed standing up from a chair using your arms (e.g., wheelchair or bedside chair)?: None Help needed to walk in hospital room?: A Little Help needed climbing 3-5 steps with a railing? : A Little 6 Click Score: 22    End of Session   Activity Tolerance: Patient tolerated treatment well Patient left: in bed;with call bell/phone within reach   PT Visit Diagnosis: Other (comment);Difficulty in walking, not elsewhere classified (R26.2) (cardio deficits)     Time: 2909-0301 PT Time Calculation (min) (ACUTE ONLY): 14 min  Charges:  $Therapeutic Activity: 8-22 mins                     Moishe Spice, PT, DPT Acute Rehabilitation Services  Pager: [phone] Office: [place] 12/17/2020, 11:07 AM

## 2020-12-17 NOTE — Discharge Summary (Signed)
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 stripe of delayed enhancement in the ventricular septum, and basal lateral wall. This is a nonspecific finding and can be seen in nonischemic cardiomyopathy. Normal T1 myocardial nulling kinetics suggest against a diagnosis of cardiac amyloidosis. ECV = 33% RIGHT VENTRICLE: Normal right ventricular chamber size. Normal right ventricular wall thickness. Mildly reduced right ventricular systolic function. RVEF = 44% Mild global hypokinesis. No post contrast delayed myocardial enhancement. ATRIA: Mild enlargement of left atrial size. Normal right atrial size. VALVES: Cannot be assessed in setting of artifact from arrhythmia. PERICARDIUM: Normal pericardium.  Small pericardial effusion. OTHER: Small bilateral pleural effusions R > L. MEASUREMENTS: Volumes and EF are approximate values in the setting of significant arrhythmia motion artifact. Left ventricle: LV male LV EF: 32% (Normal 56-78%) Absolute volumes: LV EDV: 226  mL (Normal 77-195 mL) LV ESV: 154 mL (Normal 19-72 mL) LV SV: 72 mL (Normal 51-133 mL) CO: 6.0 L/min (Normal 2.8-8.8 L/min) Indexed volumes: LV EDV: 118 mL/sq-m (Normal 47-92 mL/sq-m) LV ESV: 80 mL/sq-m (Normal 13-30 mL/sq-m) LV SV: 38 mL/sq-m (Normal 32-62 mL/sq-m) CI: 3.15 L/min/sq-m (Normal 1.7-4.2 L/min/sq-m) Right ventricle: RV male RV EF: 44 % (Normal 47-74%) Absolute volumes: RV EDV: 191 mL (Normal 88-227 mL) RV ESV: 108 mL (Normal 23-103 mL) RV SV: 84 mL (Normal 52-138 mL) CO: 7.0 L/min (Normal 2.8-8.8 L/min) Indexed volumes: RV EDV: 100 mL/sq-m (Normal 55-105 mL/sq-m) RV ESV: 56 mL/sq-m (Normal 15-43 mL/sq-m) RV SV: 44 mL/sq-m (Normal 32-64 mL/sq-m) CI: 3.66 L/min/sq-m (Normal 1.7-4.2 L/min/sq-m) IMPRESSION: Arrhythmia artifact significantly impacts image quality and accuracy of volumetric assessment. 1. Severe left ventricular systolic dysfunction, LVEF 32% with moderate left ventricular chamber enlargement 2. Mild right ventricular systolic dysfunction, RVEF 44%, with normal right ventricular chamber size. 3.  No myocardial edema 4. Mid myocardial stripe of delayed enhancement in the ventricular septum and basal lateral wall. Nonspecific finding, seen in nonischemic cardiomyopathy. Findings consistent with nonischemic cardiomyopathy. No evidence of infiltrative, inflammatory process or prior infarct. Electronically Signed   By: Cherlynn Kaiser M.D.   On: 12/15/2020 07:10   ECHOCARDIOGRAM COMPLETE  Result Date: 12/11/2020    ECHOCARDIOGRAM REPORT   Patient Name:   DAIEL STROHECKER Date of Exam: 12/11/2020 Medical Rec #:  [id number]        Height:       72.0 in Accession #:    [id number]       Weight:       156.5 lb Date of Birth:  30-Jan-1961        BSA:          1.920 m Patient Age:    60 years         BP:           114/71 mmHg Patient Gender: M                HR:           97 bpm. Exam Location:  Inpatient Procedure:  2D Echo, Cardiac Doppler and Color Doppler Indications:    Cardiac arrest ([place])  [427.5.ICD-9-CM]  History:        Patient has prior history of Echocardiogram examinations, most                 recent 10/14/2016. Cardiomyopathy; Risk Factors:Diabetes and                 Hypertension.  Sonographer:    Bernadene Person RDCS Referring Phys: [id number] RAHUL P DESAI IMPRESSIONS  1. Left ventricular ejection fraction, by estimation, is 40 to 45%. The left ventricle has mildly decreased function. The left ventricle demonstrates global hypokinesis. Left ventricular diastolic parameters were normal.  2. Right ventricular systolic function is normal. The right ventricular size is normal. There is normal pulmonary artery systolic pressure.  3. The mitral valve is normal in structure. Trivial mitral valve regurgitation. No evidence of mitral stenosis.  4. The aortic valve is normal in structure. Aortic valve regurgitation is not visualized. No aortic stenosis is present.  5. The inferior vena cava is normal in size with greater than 50% respiratory variability, suggesting right atrial pressure of 3 mmHg. FINDINGS  Left Ventricle: Left ventricular ejection fraction, by estimation, is 40 to 45%. The left ventricle has mildly decreased function. The left ventricle demonstrates global hypokinesis. The left ventricular internal cavity size was normal in size. There is  no left ventricular hypertrophy. Left ventricular diastolic parameters were normal. Right Ventricle: The right ventricular size is normal. No increase in right ventricular wall thickness. Right ventricular systolic function is normal. There is normal pulmonary artery systolic pressure. The tricuspid regurgitant velocity is 1.44 m/s, and  with an assumed right atrial pressure of 3 mmHg, the estimated right ventricular systolic pressure is 93.2 mmHg. Left Atrium: Left atrial size was normal in size. Right Atrium: Right atrial size was normal in size. Pericardium: There is no evidence of pericardial effusion. Mitral Valve: The mitral valve is normal in  structure. Trivial mitral valve regurgitation. No evidence of mitral valve stenosis. Tricuspid Valve: The tricuspid valve is normal in structure. Tricuspid valve regurgitation is not demonstrated. No evidence of tricuspid stenosis. Aortic Valve: The aortic valve is normal in structure. Aortic valve regurgitation is not visualized. No aortic stenosis is present. Pulmonic Valve: The pulmonic valve was normal in structure. Pulmonic valve regurgitation is not visualized. No evidence of pulmonic stenosis. Aorta: The aortic root is normal in size and structure. Venous: The inferior vena cava is normal in size with greater than 50% respiratory variability, suggesting right atrial pressure of 3 mmHg. IAS/Shunts: No atrial level shunt detected by color flow Doppler.  LEFT VENTRICLE PLAX 2D LVIDd:         5.00 cm      Diastology LVIDs:         3.50 cm      LV e' medial:    6.58 cm/s LV PW:         1.00 cm      LV E/e' medial:  12.6 LV IVS:        1.00 cm      LV e' lateral:   6.19 cm/s LVOT diam:     2.20 cm      LV E/e' lateral: 13.4 LV SV:         68 LV SV Index:   35 LVOT Area:     3.80 cm  LV Volumes (MOD) LV vol d, MOD A2C: 127.0 ml LV vol d,  MOD A4C: 122.0 ml LV vol s, MOD A2C: 76.4 ml LV vol s, MOD A4C: 72.6 ml LV SV MOD A2C:     50.6 ml LV SV MOD A4C:     122.0 ml LV SV MOD BP:      47.4 ml RIGHT VENTRICLE RV S prime:     14.80 cm/s TAPSE (M-mode): 2.4 cm LEFT ATRIUM             Index       RIGHT ATRIUM           Index LA diam:        2.90 cm 1.51 cm/m  RA Area:     12.80 cm LA Vol (A2C):   31.7 ml 16.51 ml/m RA Volume:   33.00 ml  17.19 ml/m LA Vol (A4C):   27.5 ml 14.32 ml/m LA Biplane Vol: 31.0 ml 16.15 ml/m  AORTIC VALVE LVOT Vmax:   109.07 cm/s LVOT Vmean:  72.867 cm/s LVOT VTI:    0.179 m  AORTA Ao Root diam: 3.20 cm Ao Asc diam:  3.20 cm MITRAL VALVE               TRICUSPID VALVE MV Area (PHT): 2.95 cm    TR Peak grad:   8.3 mmHg MV Decel Time: 257 msec    TR Vmax:        144.00 cm/s MV E velocity:  82.90 cm/s MV A velocity: 74.20 cm/s  SHUNTS MV E/A ratio:  1.12        Systemic VTI:  0.18 m                            Systemic Diam: 2.20 cm Candee Furbish MD Electronically signed by Candee Furbish MD Signature Date/Time: 12/11/2020/11:31:51 AM    Final    US Abdomen Limited RUQ (LIVER/GB)  Result Date: 12/11/2020 CLINICAL DATA:  Cholecystitis. EXAM: ULTRASOUND ABDOMEN LIMITED RIGHT UPPER QUADRANT COMPARISON:  None. FINDINGS: Gallbladder: No gallstones are seen. The gallbladder wall is thickened (8.05 mm) and edematous. No sonographic Murphy sign noted by sonographer. Common bile duct: Diameter: 4.49 mm Liver: No focal lesion identified. Within normal limits in parenchymal echogenicity. Portal vein is patent on color Doppler imaging with normal direction of blood flow towards the liver. Other: None. IMPRESSION: Edematous gallbladder wall without evidence of cholelithiasis or acute cholecystitis. Further evaluation with a nuclear medicine hepatobiliary scan is recommended if this remains of clinical concern. Electronically Signed   By: Virgina Norfolk M.D.   On: 12/11/2020 00:30    Microbiology: Recent Results (from the past 240 hour(s))  Resp Panel by RT-PCR (Flu A&B, Covid) Nasopharyngeal Swab     Status: None   Collection Time: 12/10/20  9:46 PM   Specimen: Nasopharyngeal Swab; Nasopharyngeal(NP) swabs in vial transport medium  Result Value Ref Range Status   SARS Coronavirus 2 by RT PCR NEGATIVE NEGATIVE Final    Comment: (NOTE) SARS-CoV-2 target nucleic acids are NOT DETECTED.  The SARS-CoV-2 RNA is generally detectable in upper respiratory specimens during the acute phase of infection. The lowest concentration of SARS-CoV-2 viral copies this assay can detect is 138 copies/mL. A negative result does not preclude SARS-Cov-2 infection and should not be used as the sole basis for treatment or other patient management decisions. A negative result may occur with  improper specimen  collection/handling, submission of specimen other than nasopharyngeal swab, presence of viral mutation(s) within the areas targeted by  this assay, and inadequate number of viral copies(<138 copies/mL). A negative result must be combined with clinical observations, patient history, and epidemiological information. The expected result is Negative.  Fact Sheet for Patients:  EntrepreneurPulse.com.au  Fact Sheet for Healthcare Providers:  IncredibleEmployment.be  This test is no t yet approved or cleared by the [place] FDA and  has been authorized for detection and/or diagnosis of SARS-CoV-2 by FDA under an Emergency Use Authorization (EUA). This EUA will remain  in effect (meaning this test can be used) for the duration of the COVID-19 declaration under Section 564(b)(1) of the Act, 21 U.S.C.section 360bbb-3(b)(1), unless the authorization is terminated  or revoked sooner.       Influenza A by PCR NEGATIVE NEGATIVE Final   Influenza B by PCR NEGATIVE NEGATIVE Final    Comment: (NOTE) The Xpert Xpress SARS-CoV-2/FLU/RSV plus assay is intended as an aid in the diagnosis of influenza from Nasopharyngeal swab specimens and should not be used as a sole basis for treatment. Nasal washings and aspirates are unacceptable for Xpert Xpress SARS-CoV-2/FLU/RSV testing.  Fact Sheet for Patients: EntrepreneurPulse.com.au  Fact Sheet for Healthcare Providers: IncredibleEmployment.be  This test is not yet approved or cleared by the [place] FDA and has been authorized for detection and/or diagnosis of SARS-CoV-2 by FDA under an Emergency Use Authorization (EUA). This EUA will remain in effect (meaning this test can be used) for the duration of the COVID-19 declaration under Section 564(b)(1) of the Act, 21 U.S.C. section 360bbb-3(b)(1), unless the authorization is terminated or revoked.  Performed at [facility], [place] [address] [place], [place] [address]   MRSA Next Gen by PCR, Nasal     Status: None   Collection Time: 12/11/20 12:52 AM   Specimen: Nasal Mucosa; Nasal Swab  Result Value Ref Range Status   MRSA by PCR Next Gen NOT DETECTED NOT DETECTED Final    Comment: (NOTE) The GeneXpert MRSA Assay (FDA approved for NASAL specimens only), is one component of a comprehensive MRSA colonization surveillance program. It is not intended to diagnose MRSA infection nor to guide or monitor treatment for MRSA infections. Test performance is not FDA approved in patients less than 77 years old. Performed at [facility], [place] [address], [place], [place] [address]   Culture, blood (routine x 2)     Status: None   Collection Time: 12/11/20  1:51 AM   Specimen: BLOOD RIGHT HAND  Result Value Ref Range Status   Specimen Description BLOOD RIGHT HAND  Final   Special Requests AEROBIC BOTTLE ONLY Blood Culture adequate volume  Final   Culture   Final    NO GROWTH 5 DAYS Performed at [facility], [place] [address], [place], [place] [address]    Report Status 12/16/2020 FINAL  Final  Culture, blood (routine x 2)     Status: None   Collection Time: 12/11/20  1:51 AM   Specimen: BLOOD LEFT HAND  Result Value Ref Range Status   Specimen Description BLOOD LEFT HAND  Final   Special Requests AEROBIC BOTTLE ONLY Blood Culture adequate volume  Final   Culture   Final    NO GROWTH 5 DAYS Performed at [facility], [place] [address], [place], [place] [address]    Report Status 12/16/2020 FINAL  Final  Surgical PCR screen     Status: None   Collection Time: 12/16/20  1:31 PM   Specimen: Nasal Mucosa; Nasal Swab  Result  Value Ref Range Status   MRSA, PCR NEGATIVE NEGATIVE Final   Staphylococcus aureus NEGATIVE NEGATIVE Final    Comment: (NOTE) The Xpert SA Assay (FDA approved for NASAL specimens in patients 59 years of age and older), is one component of a  comprehensive surveillance program. It is not intended to diagnose infection nor to guide or monitor treatment. Performed at [facility], [place] [address] [place], [place] [address]      Labs: Basic Metabolic Panel: Recent Labs  Lab 12/10/20 2145 12/10/20 2154 12/11/20 0151 12/11/20 7680 12/12/20 0029 12/13/20 0105 12/14/20 0126 12/15/20 0051 12/16/20 0301 12/17/20 0207  NA 139   < > 136   < > 137 136 134* 138 136 137  K 4.4   < > 5.0   < > 3.9 4.3 4.2 3.8 4.7 3.7  CL 106   < > 107  --  107 104 104 105 102 102  CO2 17*  --  18*  --  _0 GLUCOSE 317*   < > 290*  --  114* 182* 376* 79 141* 74  BUN 20   < > 21  --  _1 CREATININE 1.44*   < > 1.20  --  1.12 1.03 1.03 0.86 1.09 1.00  CALCIUM 8.3*  --  8.2*  --  8.4* 8.4* 8.2* 8.7* 8.7* 8.6*  MG 2.1  --  2.0  --  2.1 2.2 2.0  --   --   --   PHOS  --   --  4.3  --   --   --  3.0  --   --   --    < > = values in this interval not displayed.   Liver Function Tests: Recent Labs  Lab 12/13/20 0105 12/14/20 0126 12/15/20 0051 12/16/20 0301 12/17/20 0207  AST 288* 230* 196* 135* 100*  ALT 303* 210* 166* 140* 113*  ALKPHOS 69 58 56 59 52  BILITOT 0.4 0.6 0.4 0.5 0.3  PROT 5.7* 5.3* 5.2* 5.5* 5.2*  ALBUMIN 3.1* 2.7* 2.5* 2.6* 2.4*   No results for input(s): LIPASE, AMYLASE in the last 168 hours. No results for input(s): AMMONIA in the last 168 hours. CBC: Recent Labs  Lab 12/10/20 2145 12/10/20 2154 12/13/20 0105 12/14/20 0126 12/15/20 0051 12/16/20 0301 12/17/20 0207  WBC 6.6   < > 6.5 4.4 4.1 4.8 5.1  NEUTROABS 1.9  --   --   --   --   --   --   HGB 13.4   < > 13.7 11.7* 11.9* 12.8* 13.1  HCT 44.3   < > 41.8 35.9* 35.6* 39.1 38.0*  MCV 92.7   < > 85.3 85.3 85.0 85.6 83.2  PLT 212   < > 184 165 188 219 235   < > = values in this interval not displayed.   Cardiac Enzymes: No results for input(s): CKTOTAL, CKMB, CKMBINDEX, TROPONINI in the last 168 hours. BNP: BNP (last 3  results) Recent Labs    06/19/20 0650  BNP 175.0*    ProBNP (last 3 results) No results for input(s): PROBNP in the last 8760 hours.  CBG: Recent Labs  Lab 12/16/20 1530 12/16/20 1553 12/16/20 1702 12/16/20 2053 12/17/20 0735  GLUCAP 56* 142* 93 291* 98       Signed:  Domenic Polite MD.  [facility] 12/17/2020, 10:12 AM

## 2020-12-17 NOTE — Discharge Instructions (Signed)
    Supplemental Discharge Instructions for  Pacemaker/Defibrillator Patients   Activity No heavy lifting or vigorous activity with your left/right arm for 6 to 8 weeks.  Do not raise your left/right arm above your head for one week.  Gradually raise your affected arm as drawn below.             12/21/20                      12/22/20                   12/23/20                  12/24/20 __  NO DRIVING for    6 months  .  WOUND CARE Keep the wound area clean and dry.  Do not get this area wet , no showers for one week; you may shower on  12/24/20   . The tape/steri-strips on your wound will fall off; do not pull them off.  No bandage is needed on the site.  DO  NOT apply any creams, oils, or ointments to the wound area. If you notice any drainage or discharge from the wound, any swelling or bruising at the site, or you develop a fever > 101? F after you are discharged home, call the office at once.  Special Instructions You are still able to use cellular telephones; use the ear opposite the side where you have your pacemaker/defibrillator.  Avoid carrying your cellular phone near your device. When traveling through airports, show security personnel your identification card to avoid being screened in the metal detectors.  Ask the security personnel to use the hand wand. Avoid arc welding equipment, MRI testing (magnetic resonance imaging), TENS units (transcutaneous nerve stimulators).  Call the office for questions about other devices. Avoid electrical appliances that are in poor condition or are not properly grounded. Microwave ovens are safe to be near or to operate.  Additional information for defibrillator patients should your device go off: If your device goes off ONCE and you feel fine afterward, notify the device clinic nurses. If your device goes off ONCE and you do not feel well afterward, call 911. If your device goes off TWICE, call 911. If your device goes off THREE times in one  day, call 911.  DO NOT DRIVE YOURSELF OR A FAMILY MEMBER WITH A DEFIBRILLATOR TO THE HOSPITAL--CALL 911.

## 2020-12-17 NOTE — Plan of Care (Signed)

## 2020-12-17 NOTE — Progress Notes (Signed)
Occupational Therapy Treatment Patient Details Name: James [name]. MRN: [id number] DOB: [birth year] Today's Date: 12/17/2020    History of present illness 60 yo male presenting 8/31 after cardiac arrest sustained at home. Pt recieved 4 rounds of epinephrine, multiple rounds of chest compressions, and x7 rounds of defibrillation prior to ROSC upon arrival to ED. S/p ICD 9/6. 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   OT comments  Pt progressing towards OT goals. Spent significant time education Pt on pacemaker precautions, don/doff of sling and performing ADL. Pt overall supervision for ADL/transfers and verbalized understanding of pacemaker precautions (PT provided handout) OPOT still essential post-acute for focus on cognitive components for safety/medicine management.   Follow Up Recommendations  Supervision/Assistance - 24 hour;Other (comment)    Equipment Recommendations  None recommended by OT    Recommendations for Other Services      Precautions / Restrictions Precautions Precautions: Other (comment);ICD/Pacemaker Precaution Comments: monitor HR; s/p ICD 9/6 - needs reinforcement of pacemaker precautions Restrictions Weight Bearing Restrictions: No       Mobility Bed Mobility Overal bed mobility: Modified Independent Bed Mobility: Supine to Sit;Sit to Supine     Supine to sit: Modified independent (Device/Increase time)     General bed mobility comments: Pt OOB at beginning and end of session    Transfers Overall transfer level: Independent Equipment used: None Transfers: Sit to/from Stand Sit to Stand: Independent         General transfer comment: No LOB or unsteadiness noted with transferring to stand from EOB.    Balance Overall balance assessment: Needs assistance Sitting-balance support: No upper extremity supported;Feet supported Sitting balance-Leahy Scale: Normal     Standing  balance support: No upper extremity supported Standing balance-Leahy Scale: Normal Standing balance comment: Able to perform gait challenges without LOB or assistance.                           ADL either performed or assessed with clinical judgement   ADL Overall ADL's : Needs assistance/impaired;Modified independent     Grooming: Wash/dry hands;Oral care;Wash/dry face;Supervision/safety;Standing Grooming Details (indicate cue type and reason): sink level Upper Body Bathing: Min guard;Standing Upper Body Bathing Details (indicate cue type and reason): education on pacemaker precautions and progression of movement Lower Body Bathing: Min guard;Sit to/from stand   Upper Body Dressing : Set up;Standing   Lower Body Dressing: Set up;Sit to/from stand Lower Body Dressing Details (indicate cue type and reason): socks Toilet Transfer: Supervision/safety;Ambulation           Functional mobility during ADLs: Supervision/safety General ADL Comments: education on pacemaker precautions and progression of movement     Vision       Perception     Praxis      Cognition Arousal/Alertness: Awake/alert Behavior During Therapy: WFL for tasks assessed/performed Overall Cognitive Status: Within Functional Limits for tasks assessed                                 General Comments: Suspect that Pt is at baseline cognition        Exercises     Shoulder Instructions       General Comments HR hovered around 100 throughout the session    Pertinent Vitals/ Pain       Pain Assessment: No/denies pain Faces Pain Scale: Hurts a  little bit Pain Location: location of ICD L shoulder Pain Descriptors / Indicators: Sore;Tightness Pain Intervention(s): Limited activity within patient's tolerance;Monitored during session;Repositioned  Home Living                                          Prior Functioning/Environment              Frequency   Min 2X/week        Progress Toward Goals  OT Goals(current goals can now be found in the care plan section)  Progress towards OT goals: Progressing toward goals  Acute Rehab OT Goals Patient Stated Goal: return home and to independence OT Goal Formulation: With patient Time For Goal Achievement: 12/26/20 Potential to Achieve Goals: Good  Plan Discharge plan remains appropriate    Co-evaluation                 AM-PAC OT "6 Clicks" Daily Activity     Outcome Measure   Help from another person eating meals?: None Help from another person taking care of personal grooming?: None Help from another person toileting, which includes using toliet, bedpan, or urinal?: None Help from another person bathing (including washing, rinsing, drying)?: None Help from another person to put on and taking off regular upper body clothing?: None Help from another person to put on and taking off regular lower body clothing?: None 6 Click Score: 24    End of Session Equipment Utilized During Treatment:  (None)  OT Visit Diagnosis: Unsteadiness on feet (R26.81);Other abnormalities of gait and mobility (R26.89);Muscle weakness (generalized) (M62.81)   Activity Tolerance Patient tolerated treatment well   Patient Left in chair;with nursing/sitter in room   Nurse Communication Mobility status (in room with Pt)        Time: 3810-1751 OT Time Calculation (min): 35 min  Charges: OT General Charges $OT Visit: 1 Visit OT Treatments $Self Care/Home Management : 23-37 mins  James [name] OTR/L Acute Rehabilitation Services Pager: [phone] Office: [phone]   James [name] James [name] 12/17/2020, 1:11 PM

## 2020-12-18 ENCOUNTER — Telehealth: Payer: Self-pay

## 2020-12-18 LAB — ANA: Anti Nuclear Antibody (ANA): NEGATIVE
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
## 2020-12-22 ENCOUNTER — Other Ambulatory Visit: Payer: Self-pay

## 2020-12-22 ENCOUNTER — Encounter: Payer: Self-pay | Admitting: Critical Care Medicine

## 2020-12-22 ENCOUNTER — Ambulatory Visit: Payer: MEDICAID | Attending: Critical Care Medicine | Admitting: Critical Care Medicine

## 2020-12-22 DIAGNOSIS — K819 Cholecystitis, unspecified: Secondary | ICD-10-CM

## 2020-12-22 DIAGNOSIS — N529 Male erectile dysfunction, unspecified: Secondary | ICD-10-CM

## 2020-12-22 DIAGNOSIS — Z8674 Personal history of sudden cardiac arrest: Secondary | ICD-10-CM

## 2020-12-22 DIAGNOSIS — E7800 Pure hypercholesterolemia, unspecified: Secondary | ICD-10-CM

## 2020-12-22 DIAGNOSIS — I420 Dilated cardiomyopathy: Secondary | ICD-10-CM

## 2020-12-22 DIAGNOSIS — S2243XD Multiple fractures of ribs, bilateral, subsequent encounter for fracture with routine healing: Secondary | ICD-10-CM

## 2020-12-22 DIAGNOSIS — I10 Essential (primary) hypertension: Secondary | ICD-10-CM

## 2020-12-22 DIAGNOSIS — I5042 Chronic combined systolic (congestive) and diastolic (congestive) heart failure: Secondary | ICD-10-CM

## 2020-12-22 DIAGNOSIS — Z87891 Personal history of nicotine dependence: Secondary | ICD-10-CM

## 2020-12-22 DIAGNOSIS — E1165 Type 2 diabetes mellitus with hyperglycemia: Secondary | ICD-10-CM

## 2020-12-22 DIAGNOSIS — R7401 Elevation of levels of liver transaminase levels: Secondary | ICD-10-CM

## 2020-12-22 DIAGNOSIS — N179 Acute kidney failure, unspecified: Secondary | ICD-10-CM

## 2020-12-22 MED ORDER — DAPAGLIFLOZIN PROPANEDIOL 5 MG PO TABS
5.0000 mg | ORAL_TABLET | Freq: Every day | ORAL | 4 refills | Status: DC
  Filled 2020-12-22: qty 30, 30d supply, fill #0

## 2020-12-22 MED ORDER — LANTUS SOLOSTAR 100 UNIT/ML ~~LOC~~ SOPN
14.0000 [IU] | PEN_INJECTOR | Freq: Every day | SUBCUTANEOUS | 2 refills | Status: DC
  Filled 2020-12-22: qty 6, 21d supply, fill #0
  Filled 2021-01-09: qty 3, 21d supply, fill #0
  Filled 2021-03-09: qty 3, 21d supply, fill #1
  Filled 2021-04-07: qty 3, 21d supply, fill #2

## 2020-12-22 MED ORDER — SACUBITRIL-VALSARTAN 24-26 MG PO TABS
1.0000 | ORAL_TABLET | Freq: Two times a day (BID) | ORAL | 4 refills | Status: DC
  Filled 2020-12-22: qty 60, 30d supply, fill #0

## 2020-12-22 MED ORDER — SPIRONOLACTONE 25 MG PO TABS
12.5000 mg | ORAL_TABLET | Freq: Every day | ORAL | 4 refills | Status: DC
  Filled 2020-12-22 – 2021-03-09 (×2): qty 30, 60d supply, fill #0

## 2020-12-22 MED ORDER — FUROSEMIDE 20 MG PO TABS: ORAL_TABLET | ORAL | 3 refills | Status: DC

## 2020-12-22 MED ORDER — CARVEDILOL 25 MG PO TABS
25.0000 mg | ORAL_TABLET | Freq: Two times a day (BID) | ORAL | 6 refills | Status: DC
  Filled 2020-12-22 – 2021-01-08 (×2): qty 60, 30d supply, fill #0

## 2020-12-22 MED ORDER — METFORMIN HCL 1000 MG PO TABS
1000.0000 mg | ORAL_TABLET | Freq: Two times a day (BID) | ORAL | 2 refills | Status: DC
  Filled 2020-12-22: qty 120, 60d supply, fill #0
  Filled 2021-01-08: qty 60, 30d supply, fill #0
  Filled 2021-03-09: qty 60, 30d supply, fill #1

## 2020-12-22 MED ORDER — ATORVASTATIN CALCIUM 40 MG PO TABS: ORAL_TABLET | ORAL | 1 refills | Status: DC

## 2020-12-22 NOTE — Assessment & Plan Note (Addendum)
Poorly controlled diabetes continue metformin and Farxiga along with Lantus Dietary recommendations given

## 2020-12-22 NOTE — Patient Instructions (Signed)
Hold atorvastatin and furosemide for now  Obtain a pill cutter and cut the Aldactone tablets in half to take one half daily  Refills on all of your medications for future refills sent to our pharmacy  Focus on a diabetic diet as attached  Keep your upcoming appointments with cardiology  You declined vaccines for today  Return to see Dr. Delford Field 2 months

## 2020-12-22 NOTE — Assessment & Plan Note (Signed)
Presumed cholecystitis but HIDA scan negative for gallbladder disease

## 2020-12-22 NOTE — Assessment & Plan Note (Signed)
Continue current medications for heart failure which will treat blood pressure blood pressure well controlled at this time

## 2020-12-22 NOTE — Assessment & Plan Note (Signed)
Hold atorvastatin until liver function improves

## 2020-12-22 NOTE — Assessment & Plan Note (Signed)
Follow-up metabolic panel 

## 2020-12-22 NOTE — Assessment & Plan Note (Signed)
Resolution of rib fractures

## 2020-12-22 NOTE — Assessment & Plan Note (Signed)
Not currently smoking 

## 2020-12-22 NOTE — Assessment & Plan Note (Signed)
History of cardiac arrest now has ICD in place

## 2020-12-22 NOTE — Assessment & Plan Note (Signed)
As per cardiomyopathy assessment 

## 2020-12-22 NOTE — Assessment & Plan Note (Signed)
I indicated the patient needs to discontinue Viagra for now
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
## 2020-12-22 NOTE — Assessment & Plan Note (Signed)
Cardiomyopathy nonischemic in nature status post cardiac arrest and now has ICD in place  Patient knows to keep electrophysiology follow-up this week in cardiology follow-up on 19 September  All medications were refilled for long-term follow-up in our clinic  Patient is applying for Medicaid I hope he will achieve this given his conditions I believe he would qualify

## 2020-12-23 ENCOUNTER — Other Ambulatory Visit: Payer: Self-pay

## 2020-12-23 ENCOUNTER — Telehealth: Payer: Self-pay

## 2020-12-23 ENCOUNTER — Other Ambulatory Visit: Payer: Self-pay | Admitting: Critical Care Medicine

## 2020-12-23 DIAGNOSIS — E7800 Pure hypercholesterolemia, unspecified: Secondary | ICD-10-CM

## 2020-12-23 LAB — CBC WITH DIFFERENTIAL/PLATELET
Basophils Absolute: 0 10*3/uL (ref 0.0–0.2)
Basos: 1 %
EOS (ABSOLUTE): 0.3 10*3/uL (ref 0.0–0.4)
Eos: 6 %
Hematocrit: 37 % — ABNORMAL LOW (ref 37.5–51.0)
Hemoglobin: 11.9 g/dL — ABNORMAL LOW (ref 13.0–17.7)
Immature Grans (Abs): 0 10*3/uL (ref 0.0–0.1)
Immature Granulocytes: 0 %
Lymphocytes Absolute: 1.5 10*3/uL (ref 0.7–3.1)
Lymphs: 28 %
MCH: 27.7 pg (ref 26.6–33.0)
MCHC: 32.2 g/dL (ref 31.5–35.7)
MCV: 86 fL (ref 79–97)
Monocytes Absolute: 0.4 10*3/uL (ref 0.1–0.9)
Monocytes: 7 %
Neutrophils Absolute: 3.3 10*3/uL (ref 1.4–7.0)
Neutrophils: 58 %
Platelets: 302 10*3/uL (ref 150–450)
RBC: 4.29 x10E6/uL (ref 4.14–5.80)
RDW: 14 % (ref 11.6–15.4)
WBC: 5.6 10*3/uL (ref 3.4–10.8)

## 2020-12-23 LAB — COMPREHENSIVE METABOLIC PANEL
ALT: 79 IU/L — ABNORMAL HIGH (ref 0–44)
AST: 40 IU/L (ref 0–40)
Albumin/Globulin Ratio: 1.8 (ref 1.2–2.2)
Albumin: 4 g/dL (ref 3.8–4.8)
Alkaline Phosphatase: 127 IU/L — ABNORMAL HIGH (ref 44–121)
BUN/Creatinine Ratio: 18 (ref 10–24)
BUN: 18 mg/dL (ref 8–27)
Bilirubin Total: <0.2 mg/dL (ref 0.0–1.2)
CO2: 26 mmol/L (ref 20–29)
Calcium: 9.4 mg/dL (ref 8.6–10.2)
Chloride: 98 mmol/L (ref 96–106)
Creatinine, Ser: 1.01 mg/dL (ref 0.76–1.27)
Globulin, Total: 2.2 g/dL (ref 1.5–4.5)
Glucose: 306 mg/dL — ABNORMAL HIGH (ref 65–99)
Potassium: 4.6 mmol/L (ref 3.5–5.2)
Sodium: 139 mmol/L (ref 134–144)
Total Protein: 6.2 g/dL (ref 6.0–8.5)
eGFR: 84 mL/min/{1.73_m2} (ref >59)

## 2020-12-23 LAB — GLUCOSE, POCT (MANUAL RESULT ENTRY): POC Glucose: 345 mg/dl — AB (ref 70–99)

## 2020-12-23 MED ORDER — ATORVASTATIN CALCIUM 40 MG PO TABS
40.0000 mg | ORAL_TABLET | Freq: Every day | ORAL | 1 refills | Status: DC
  Filled 2020-12-23: qty 30, 30d supply, fill #0
  Filled 2021-01-26: qty 30, 30d supply, fill #1

## 2020-12-23 NOTE — Telephone Encounter (Signed)
Contacted pt to go over lab results pt didn't answer lvm asking pt to give me a call at her/his earliest convenience   Sent a CRM and forward labs to NT to give pt labs when they call back   

## 2020-12-24 NOTE — Progress Notes (Addendum)
Cardiology Office Note:    Date:  12/29/2020   ID:  James [name]., DOB 1961-03-01, MRN [id number]  PCP:  Elsie Stain, MD  Cardiologist:  Kirk Ruths, MD   Referring MD: Elsie Stain, MD   Chief Complaint  Patient presents with   Hospitalization Follow-up    Cardiac arrest, NICM    History of Present Illness:    James [name]. is a 60 y.o. male with a hx of HTN, chronic combined systolic and diastolic heart failure, NICM, moderate MR, DM2 with hyperglycemia, HLD, hx of tobacco use, hx of cocaine abuse. Hx of LVEF of 20-25%, recovered to 55%. CHF diagnosed in 2018 in the setting of hospitalization for PNA, Heart cath at the time with 50% LAD but otherwise nonobstructive CAD.  He presented 12/10/20 with Vfib/VT arrest. EMS dispatched. He was treated with defib x 7, 450 mg amiodarone, epi x 4, and achieved ROSC by the time of arrival in the ER. Echo with EF 40-45%. Total down time was 15-20 min. UDS negative for cocaine. Repeat heart cath with stable disease, no intervention. Cardiac MR unremarkable. He underwent ICD placement 12/16/20. Amiodarone was held for elevated LFTs. GDMT titrated for CHF.   He presents with his wife who is [facility] and uses an APP on her phone to listen to providers. He is overall doing quite well. He appears euvolemic. We discussed salt reduction and daily weights. Continue to hold scheduled lasix. His A1c is not controlled and he was not advised at his last PCP appt. I would like him to see endocrinology. Medicaid is pending. He has 6 rib fractures from CPR along with ICD placement. He works a physically demanding job. I have recommended return to work with light duty next Monday. He is agreeable.    Past Medical History:  Diagnosis Date   Cellulitis and abscess of foot 17/51/0258   Chronic systolic heart failure (HCC)    Cocaine abuse (HCC)    Cocaine dependence with cocaine-induced mood disorder ([place])    Depression    Diabetes mellitus  without complication ([place])    Essential hypertension    Multiple closed fractures of ribs of both sides    NICM (nonischemic cardiomyopathy) ([place]) EF 20-25%    Suicidal ideations 07/12/2017    Past Surgical History:  Procedure Laterality Date   CARDIAC CATHETERIZATION N/A 04/30/2016   Procedure: Right/Left Heart Cath and Coronary Angiography;  Surgeon: Nelva Bush, MD;  Location: [place] [facility];  Service: Cardiovascular;  Laterality: N/A;   FINGER SURGERY     ICD IMPLANT N/A 12/16/2020   Procedure: ICD IMPLANT;  Surgeon: Vickie Epley, MD;  Location: [place] [facility];  Service: Cardiovascular;  Laterality: N/A;   LEFT HEART CATH AND CORONARY ANGIOGRAPHY N/A 12/12/2020   Procedure: LEFT HEART CATH AND CORONARY ANGIOGRAPHY;  Surgeon: Troy Sine, MD;  Location: [place] [facility];  Service: Cardiovascular;  Laterality: N/A;    Current Medications: Current Meds  Medication Sig   aspirin 81 MG chewable tablet Chew 81 mg by mouth daily. 1Daily   atorvastatin (LIPITOR) 40 MG tablet Take 1 tablet (40 mg total) by mouth daily.   blood glucose meter kit and supplies Dispense based on patient and insurance preference. Use up to four times daily as directed. (FOR ICD-9 250.00, 250.01).   Blood Glucose Monitoring Suppl (TRUE METRIX METER) w/Device KIT Use to measure blood sugar twice a day   carvedilol (COREG) 25 MG tablet Take  1 tablet (25 mg total) by mouth 2 (two) times daily with a meal.   cholecalciferol (VITAMIN D3) 25 MCG (1000 UNIT) tablet Take 1,000 Units by mouth daily.   dapagliflozin propanediol (FARXIGA) 10 MG TABS tablet Take 1 tablet (10 mg total) by mouth daily before breakfast.   furosemide (LASIX) 20 MG tablet HOLD until seen by cardiology   glucose blood test strip USE TO CHECK BLOOD SUGARS 2 TIMES DAILY   insulin glargine (LANTUS SOLOSTAR) 100 UNIT/ML Solostar Pen Inject 14 Units into the skin at bedtime.   Insulin Syringe 27G X 1/2" 0.5 ML MISC Use with lantus    metFORMIN (GLUCOPHAGE) 1000 MG tablet Take 1 tablet (1,000 mg total) by mouth 2 (two) times daily with a meal.   sacubitril-valsartan (ENTRESTO) 24-26 MG Take 1 tablet by mouth 2 (two) times daily.   spironolactone (ALDACTONE) 25 MG tablet Take 0.5 tablets (12.5 mg total) by mouth daily.   TRUEplus Lancets 28G MISC Use to measure blood sugar twice a day   [DISCONTINUED] dapagliflozin propanediol (FARXIGA) 5 MG TABS tablet Take 1 tablet (5 mg total) by mouth daily before breakfast. PASS     Allergies:   Patient has no known allergies.   Social History   Socioeconomic History   Marital status: Married    Spouse name: Not on file   Number of children: Not on file   Years of education: Not on file   Highest education level: Not on file  Occupational History   Not on file  Tobacco Use   Smoking status: Every Day    Packs/day: 0.50    Years: 23.00    Pack years: 11.50    Types: Cigarettes   Smokeless tobacco: Never   Tobacco comments:    Every other day   Vaping Use   Vaping Use: Never used  Substance and Sexual Activity   Alcohol use: Yes   Drug use: Not Currently    Types: Cocaine, "Crack" cocaine   Sexual activity: Not Currently  Other Topics Concern   Not on file  Social History Narrative   Not on file   Social Determinants of Health   Financial Resource Strain: Not on file  Food Insecurity: Not on file  Transportation Needs: Not on file  Physical Activity: Not on file  Stress: Not on file  Social Connections: Not on file     Family History: The patient's family history includes Brain cancer in his father; Diabetes in his brother, mother, and another family member; Drug abuse in his brother; Hypertension in an other family member; Mental illness in his brother.  ROS:   Please see the history of present illness.     All other systems reviewed and are negative.  EKGs/Labs/Other Studies Reviewed:    The following studies were reviewed today:  ICD implant  12/16/20:  CONCLUSIONS:   1. Non-ischemic cardiomyopathy and a history of VT/VF arrest  2. No apparent complications.  3. Close outpatient follow up. Will need referral to genetics.   Left heart cath 12/12/20:   Mid LAD lesion is 50% stenosed.   Mild to moderate nonobstructive CAD with calcification of the mid LAD with previously noted 50% stenosis not significantly changed.  Normal intermediate, circumflex, and RCA.   LVEDP 11 mmHg.   RECOMMENDATION: Medical therapy.    Echo 12/11/20: 1. Left ventricular ejection fraction, by estimation, is 40 to 45%. The  left ventricle has mildly decreased function. The left ventricle  demonstrates global hypokinesis. Left  ventricular diastolic parameters  were normal.   2. Right ventricular systolic function is normal. The right ventricular  size is normal. There is normal pulmonary artery systolic pressure.   3. The mitral valve is normal in structure. Trivial mitral valve  regurgitation. No evidence of mitral stenosis.   4. The aortic valve is normal in structure. Aortic valve regurgitation is  not visualized. No aortic stenosis is present.   5. The inferior vena cava is normal in size with greater than 50%  respiratory variability, suggesting right atrial pressure of 3 mmHg   EKG:  EKG is not ordered today.    LABS 06/19/2020: B Natriuretic Peptide 175.0 12/14/2020: Magnesium 2.0 12/22/2020: ALT 79; BUN 18; Creatinine, Ser 1.01; Hemoglobin 11.9; Platelets 302; Potassium 4.6; Sodium 139  Recent Lipid Panel    Component Value Date/Time   CHOL 112 12/12/2020 0029   TRIG 33 12/12/2020 0029   HDL 75 12/12/2020 0029   CHOLHDL 1.5 12/12/2020 0029   VLDL 7 12/12/2020 0029   LDLCALC 30 12/12/2020 0029    Physical Exam:    VS:  BP 136/82   Pulse 82   Ht 6' (1.829 m)   Wt 153 lb (69.4 kg)   SpO2 98%   BMI 20.75 kg/m     Wt Readings from Last 3 Encounters:  12/29/20 153 lb (69.4 kg)  12/17/20 159 lb 6.4 oz (72.3 kg)  09/16/20 162 lb  12.8 oz (73.8 kg)     GEN:  Well nourished, well developed in no acute distress HEENT: Normal NECK: No JVD; No carotid bruits LYMPHATICS: No lymphadenopathy CARDIAC: RRR, no murmurs, rubs, gallops RESPIRATORY:  Clear to auscultation without rales, wheezing or rhonchi  ABDOMEN: Soft, non-tender, non-distended MUSCULOSKELETAL:  No edema; No deformity  SKIN: Warm and dry NEUROLOGIC:  Alert and oriented x 3 PSYCHIATRIC:  Normal affect  Cath site C/D/I  ASSESSMENT:    1. Chronic combined systolic and diastolic heart failure ([place])   2. Nonischemic cardiomyopathy ([place])   3. Essential hypertension   4. HYPERCHOLESTEROLEMIA   5. Nonobstructive atherosclerosis of coronary artery   6. History of tobacco use   7. History of cardiac arrest   8. History of cocaine abuse ([place])   9. Uncontrolled type 2 diabetes mellitus with hyperglycemia (HCC)    PLAN:    In order of problems listed above:  Chronic combined systolic and diastolic heart failure NICM Nonobstructive CAD - continue ASA, statin, coreg, entresto, farxiga, and spiro - 20 mg lasix on hold until seen - BP has been controlled - weight has been stable - will increase PTA 5 mg farxiga to 10 mg farxiga  - continue salt restriction and daily weights   Hyperlipidemia with LDL goal < 70 12/12/2020: Cholesterol 112; HDL 75; LDL Cholesterol 30; Triglycerides 33; VLDL 7 - statin on hold given LFTs - 40 mg lipitor - on hold   Vfib/VT arrest ICD in place - per EP - amio was not restarted prior to discharge - will recheck LFTs in 2 weeks - per Dr. Stanford Breed, will hold off on genetics referral for now   DM with hyperglycemia - A1c 10.7% - on insulin - per PCP - [facility] - will refer to endocrinology   Former smoker - has not smoked since discharge - congratulated him on this - he has been clean and sober for several months - UDS negative at time of arrest  Follow up with Dr. Stanford Breed or myself in 1  month.  Medication Adjustments/Labs and Tests Ordered: Current medicines are reviewed at length with the patient today.  Concerns regarding medicines are outlined above.  Orders Placed This Encounter  Procedures   Basic metabolic panel   Ambulatory referral to Endocrinology    Meds ordered this encounter  Medications   dapagliflozin propanediol (FARXIGA) 10 MG TABS tablet    Sig: Take 1 tablet (10 mg total) by mouth daily before breakfast.    Dispense:  30 tablet    Refill:  2     Signed, Ledora Bottcher, PA  12/29/2020 10:31 AM    [facility]

## 2020-12-25 ENCOUNTER — Telehealth: Payer: Self-pay | Admitting: Critical Care Medicine

## 2020-12-25 ENCOUNTER — Other Ambulatory Visit: Payer: Self-pay

## 2020-12-25 NOTE — Telephone Encounter (Signed)
Pt was here on Monday for appt w/ PCP and states he received a call from Disability office asking when will he be able to return to work based on his current status. Please advise and thank you.

## 2020-12-26 ENCOUNTER — Encounter: Payer: Self-pay | Admitting: Student

## 2020-12-26 ENCOUNTER — Other Ambulatory Visit: Payer: Self-pay

## 2020-12-26 ENCOUNTER — Ambulatory Visit (INDEPENDENT_AMBULATORY_CARE_PROVIDER_SITE_OTHER): Payer: Self-pay

## 2020-12-26 DIAGNOSIS — I420 Dilated cardiomyopathy: Secondary | ICD-10-CM

## 2020-12-26 LAB — CUP PACEART INCLINIC DEVICE CHECK
Battery Remaining Longevity: 137 mo
Battery Voltage: 3.14 V
Brady Statistic RV Percent Paced: 0 %
Date Time Interrogation Session: 20220916105509
HighPow Impedance: 59 Ohm
Implantable Lead Implant Date: 20220906
Implantable Lead Location: 753860
Implantable Pulse Generator Implant Date: 20220906
Lead Channel Impedance Value: 285 Ohm
Lead Channel Impedance Value: 361 Ohm
Lead Channel Pacing Threshold Amplitude: 0.625 V
Lead Channel Pacing Threshold Pulse Width: 0.4 ms
Lead Channel Sensing Intrinsic Amplitude: 11.125 mV
Lead Channel Sensing Intrinsic Amplitude: 11.75 mV
Lead Channel Setting Pacing Amplitude: 3.5 V
Lead Channel Setting Pacing Pulse Width: 0.4 ms
Lead Channel Setting Sensing Sensitivity: 0.3 mV

## 2020-12-26 NOTE — Progress Notes (Signed)
Wound check appointment. Steri-strips removed. Wound without redness or edema. Incision edges approximated, wound well healed. Normal device function. Thresholds, sensing, and impedances consistent with implant measurements. Device programmed at 3.5V for extra safety margin until 3 month visit. Histogram distribution reflects elevated heart rate trends. AT/AF burden 2.0%. No OAC on file. Some EGM's appear PVC's based on morphology. No ventricular arrhythmias noted. Patient educated about wound care, arm mobility, lifting restrictions, shock plan. ROV in 3 months with Dr. Lalla Brothers.  [place] DMV driving restrictions discussed with patient with verbal understanding.  Routing to Dr. Lalla Brothers to review EGM's for [facility] candidate or PVC's?

## 2020-12-26 NOTE — Telephone Encounter (Signed)
Informed patient there was not a note from Dr. Delford Field regarding time off or disability only that he was advised to f/u in 2 mos.   Patient states he was on the way to an appointment with his cardiologist. Advised to speak with them about driving restrictions on terms for disability.   Patient verbalized understanding.

## 2020-12-26 NOTE — Patient Instructions (Addendum)
   After Your ICD (Implantable Cardiac Defibrillator)    Monitor your defibrillator site for redness, swelling, and drainage. Call the device clinic at [phone] if you experience these symptoms or fever/chills.  Your incision was closed with Steri-strips or staples:  You may shower 7 days after your procedure and wash your incision with soap and water. Avoid lotions, ointments, or perfumes over your incision until it is well-healed.  You may use a hot tub or a pool after your wound check appointment if the incision is completely closed.  Do not lift, push or pull greater than 10 pounds with the affected arm until 6 weeks after your procedure. There are no other restrictions in arm movement after your wound check appointment.  Your ICD is MRI compatible.  Your ICD is designed to protect you from life threatening heart rhythms. Because of this, you may receive a shock.   1 shock with no symptoms:  Call the office during business hours. 1 shock with symptoms (chest pain, chest pressure, dizziness, lightheadedness, shortness of breath, overall feeling unwell):  Call 911. If you experience 2 or more shocks in 24 hours:  Call 911. If you receive a shock, you should not drive.  [place] DMV - no driving for 6 months if you receive appropriate therapy from your ICD.   ICD Alerts:  Some alerts are vibratory and others beep. These are NOT emergencies. Please call our office to let us know. If this occurs at night or on weekends, it can wait until the next business day. Send a remote transmission.  If your device is capable of reading fluid status (for heart failure), you will be offered monthly monitoring to review this with you.   Remote monitoring is used to monitor your ICD from home. This monitoring is scheduled every 91 days by our office. It allows [place] to keep an eye on the functioning of your device to ensure it is working properly. You will routinely see your Electrophysiologist annually (more  often if necessary).  Do not drive for 6 months per [place] DMV

## 2020-12-29 ENCOUNTER — Ambulatory Visit (INDEPENDENT_AMBULATORY_CARE_PROVIDER_SITE_OTHER): Payer: Self-pay | Admitting: Physician Assistant

## 2020-12-29 ENCOUNTER — Other Ambulatory Visit: Payer: Self-pay

## 2020-12-29 ENCOUNTER — Telehealth: Payer: Self-pay

## 2020-12-29 ENCOUNTER — Encounter: Payer: Self-pay | Admitting: Physician Assistant

## 2020-12-29 VITALS — BP 136/82 | HR 82 | Ht 72.0 in | Wt 153.0 lb

## 2020-12-29 DIAGNOSIS — I10 Essential (primary) hypertension: Secondary | ICD-10-CM

## 2020-12-29 DIAGNOSIS — Z87891 Personal history of nicotine dependence: Secondary | ICD-10-CM

## 2020-12-29 DIAGNOSIS — E7800 Pure hypercholesterolemia, unspecified: Secondary | ICD-10-CM

## 2020-12-29 DIAGNOSIS — E1165 Type 2 diabetes mellitus with hyperglycemia: Secondary | ICD-10-CM

## 2020-12-29 DIAGNOSIS — Z8674 Personal history of sudden cardiac arrest: Secondary | ICD-10-CM

## 2020-12-29 DIAGNOSIS — I5042 Chronic combined systolic (congestive) and diastolic (congestive) heart failure: Secondary | ICD-10-CM

## 2020-12-29 DIAGNOSIS — I2510 Atherosclerotic heart disease of native coronary artery without angina pectoris: Secondary | ICD-10-CM

## 2020-12-29 DIAGNOSIS — I428 Other cardiomyopathies: Secondary | ICD-10-CM

## 2020-12-29 DIAGNOSIS — F1411 Cocaine abuse, in remission: Secondary | ICD-10-CM

## 2020-12-29 MED ORDER — DAPAGLIFLOZIN PROPANEDIOL 10 MG PO TABS
10.0000 mg | ORAL_TABLET | Freq: Every day | ORAL | 2 refills | Status: DC
  Filled 2020-12-29 – 2021-01-08 (×2): qty 30, 30d supply, fill #0

## 2020-12-29 NOTE — Telephone Encounter (Signed)
Pt. Given results and instructions. Verbalizes understanding. 

## 2020-12-29 NOTE — Patient Instructions (Addendum)
Medication Instructions:  Start Farxiga 10 mg (1 Tablet Daily) *If you need a refill on your cardiac medications before your next appointment, please call your pharmacy*   Lab Work: CMET (To Be done in 2 weeks) If you have labs (blood work) drawn today and your tests are completely normal, you will receive your results only by: MyChart Message (if you have MyChart) OR A paper copy in the mail If you have any lab test that is abnormal or we need to change your treatment, we will call you to review the results.   Testing/Procedures: No Testing   Follow-Up: At [facility], you and your health needs are our priority.  As part of our continuing mission to provide you with exceptional heart care, we have created designated Provider Care Teams.  These Care Teams include your primary Cardiologist (physician) and Advanced Practice Providers (APPs -  Physician Assistants and Nurse Practitioners) who all work together to provide you with the care you need, when you need it.  We recommend signing up for the patient portal called "MyChart".  Sign up information is provided on this After Visit Summary.  MyChart is used to connect with patients for Virtual Visits (Telemedicine).  Patients are able to view lab/test results, encounter notes, upcoming appointments, etc.  Non-urgent messages can be sent to your provider as well.   To learn more about what you can do with MyChart, go to [email or web address].    Your next appointment:   1 month(s)  The format for your next appointment:   In Person  Provider:   Olga Millers, MD

## 2020-12-30 ENCOUNTER — Telehealth: Payer: Self-pay | Admitting: Physician Assistant

## 2020-12-30 ENCOUNTER — Encounter: Payer: Self-pay | Admitting: Physician Assistant

## 2020-12-30 ENCOUNTER — Other Ambulatory Visit: Payer: Self-pay

## 2020-12-30 ENCOUNTER — Telehealth: Payer: Self-pay | Admitting: Critical Care Medicine

## 2020-12-30 NOTE — Telephone Encounter (Signed)
James [name] came into office requesting a letter to return to work with no restrictions. Letter was given to patient at appt on 9/19 returning to work light duty.  Spoke with [name], she spoke with patient on 9/20. New note was given to patient to return to work on 01/06/21 with no restrictions.

## 2020-12-30 NOTE — Telephone Encounter (Signed)
Copied from CRM [id number]. Topic: General - Other >> Dec 30, 2020  1:12 PM James [name] wrote: Reason for CRM: Pt called in stating he recently seen PCP and needs a note for his job pt states he has been out of work since 08/31 and needs the note until 09/27 so he can go back next week, pt states he does need this today to take to his job. Please advise

## 2020-12-31 ENCOUNTER — Encounter: Payer: Self-pay | Admitting: Critical Care Medicine

## 2020-12-31 NOTE — Telephone Encounter (Signed)
Looks like his cardiologist wrote him a return to work as well for 9/27

## 2021-01-05 ENCOUNTER — Other Ambulatory Visit: Payer: Self-pay

## 2021-01-08 ENCOUNTER — Other Ambulatory Visit: Payer: Self-pay | Admitting: Critical Care Medicine

## 2021-01-08 ENCOUNTER — Other Ambulatory Visit: Payer: Self-pay

## 2021-01-08 NOTE — Telephone Encounter (Signed)
Requested medication (s) are due for refill today: Due 01/16/21  Requested medication (s) are on the active medication list: yes  Last refill: 12/17/20  #60  0 refills  Future visit scheduled yes 03/02/21  Notes to clinic: historical provider  Requested Prescriptions  Pending Prescriptions Disp Refills   sildenafil (VIAGRA) 100 MG tablet 5 tablet 11    Sig: TAKE 0.5-1 TABLETS (50-100 MG TOTAL) BY MOUTH DAILY AS NEEDED FOR ERECTILE DYSFUNCTION.     Urology: Erectile Dysfunction Agents Passed - 01/08/2021 12:15 PM      Passed - Last BP in normal range    BP Readings from Last 1 Encounters:  12/29/20 136/82          Passed - Valid encounter within last 12 months    Recent Outpatient Visits           2 weeks ago Dilated cardiomyopathy [facility])   [facility] Storm Frisk, MD   1 month ago Uncontrolled type 2 diabetes mellitus with hyperglycemia [facility])   [facility] Storm Frisk, MD   4 months ago Uncontrolled type 2 diabetes mellitus with hyperglycemia [facility])   [facility] Drucilla Chalet, RPH-CPP   5 months ago Uncontrolled type 2 diabetes mellitus with hyperglycemia [facility])   [place] [facility] Storm Frisk, MD   8 months ago Uncontrolled type 2 diabetes mellitus with hyperglycemia [facility])   [facility] Hagerman, Marzella Schlein, [place]       Future Appointments             In 3 weeks Crenshaw, Madolyn Frieze, MD [facility]   In 1 month Storm Frisk, MD [facility]             sacubitril-valsartan (ENTRESTO) 24-26 MG 60 tablet 0    Sig: Take 1 tablet by mouth 2 (two) times daily.     Off-Protocol Failed - 01/08/2021 12:15 PM      Failed - Medication not assigned to a protocol, review manually.      Passed - Valid encounter within last 12 months    Recent Outpatient Visits            2 weeks ago Dilated cardiomyopathy [facility])   [facility] Storm Frisk, MD   1 month ago Uncontrolled type 2 diabetes mellitus with hyperglycemia [facility])   [facility] Storm Frisk, MD   4 months ago Uncontrolled type 2 diabetes mellitus with hyperglycemia [facility])   [facility] [place], Cornelius Moras, RPH-CPP   5 months ago Uncontrolled type 2 diabetes mellitus with hyperglycemia [facility])   [facility] Storm Frisk, MD   8 months ago Uncontrolled type 2 diabetes mellitus with hyperglycemia [facility]   [facility] Dendron, Marzella Schlein, [place]       Future Appointments             In 3 weeks Crenshaw, Madolyn Frieze, MD [facility], [facility]   In 1 month Delford Field Charlcie Cradle, MD [facility]

## 2021-01-09 ENCOUNTER — Other Ambulatory Visit: Payer: Self-pay

## 2021-01-12 ENCOUNTER — Other Ambulatory Visit: Payer: Self-pay

## 2021-01-12 ENCOUNTER — Inpatient Hospital Stay: Payer: Self-pay | Admitting: Nurse Practitioner

## 2021-01-14 ENCOUNTER — Encounter (HOSPITAL_COMMUNITY): Payer: Self-pay | Admitting: Radiology

## 2021-01-22 ENCOUNTER — Telehealth: Payer: Self-pay | Admitting: Critical Care Medicine

## 2021-01-22 NOTE — Telephone Encounter (Signed)
Patient received a 01/14/2021 letter regarding imaging results and would like a follow up call, patient is concerned.

## 2021-01-23 NOTE — Telephone Encounter (Signed)
Called pt unable to reach left Vm to call back.

## 2021-01-26 ENCOUNTER — Other Ambulatory Visit: Payer: Self-pay | Admitting: Critical Care Medicine

## 2021-01-26 ENCOUNTER — Other Ambulatory Visit: Payer: Self-pay

## 2021-01-26 DIAGNOSIS — I10 Essential (primary) hypertension: Secondary | ICD-10-CM

## 2021-01-26 NOTE — Telephone Encounter (Signed)
Spoke to the patient and has 1.6 cm lesion on right kidney.  This can be evaluated with MRI at a later date.  Pt understands

## 2021-01-26 NOTE — Telephone Encounter (Signed)
Requested medications are due for refill today.  unknown  Requested medications are on the active medications list.  no  Last refill. 07/29/2020  Future visit scheduled.   yes  Notes to clinic.  Medication was discontinued 12/17/2020.

## 2021-01-28 ENCOUNTER — Other Ambulatory Visit: Payer: Self-pay

## 2021-01-28 NOTE — Progress Notes (Signed)
HPI: FU cardiomyopathy and congestive heart failure.  Patient admitted in January 2018 with dyspnea felt to be a combination of pneumonia and congestive heart failure. Echocardiogram showed severely reduced LV function with ejection fraction 20-25%, restrictive filling, mild left ventricular enlargement, mild to moderate mitral regurgitation and moderately elevated pulmonary pressures. Cardiac catheterization January 2018 showed 50% LAD but otherwise no obstructive coronary disease. Patient was treated with antibiotics and diuretics with improvement. ABIs June 2018 normal. Follow-up echocardiogram July 2018 showed normal LV systolic function and grade 1 diastolic dysfunction.  Patient suffered a cardiac arrest September 2022.  Cardiac MRI September 2022 showed ejection fraction 32%, mild RV dysfunction and findings consistent with nonischemic cardiomyopathy.  Echocardiogram September 2022 showed ejection fraction 40 to 45%.  Cardiac catheterization September 2022 showed 50% mid LAD.  Had ICD implanted September 2022.  Abdominal CT August 2022 showed indeterminate 1.6 cm right renal lesion and follow-up MRI recommended.  Since last seen, the patient denies any dyspnea on exertion, orthopnea, PND, pedal edema, palpitations, syncope or chest pain.   Current Outpatient Medications  Medication Sig Dispense Refill   aspirin 81 MG chewable tablet Chew 81 mg by mouth daily. 1Daily     atorvastatin (LIPITOR) 40 MG tablet Take 1 tablet (40 mg total) by mouth daily. 90 tablet 1   blood glucose meter kit and supplies Dispense based on patient and insurance preference. Use up to four times daily as directed. (FOR ICD-9 250.00, 250.01). 1 each 0   Blood Glucose Monitoring Suppl (TRUE METRIX METER) w/Device KIT Use to measure blood sugar twice a day 1 kit 0   carvedilol (COREG) 25 MG tablet Take 1 tablet (25 mg total) by mouth 2 (two) times daily with a meal. 60 tablet 6   cholecalciferol (VITAMIN D3) 25 MCG  (1000 UNIT) tablet Take 1,000 Units by mouth daily.     dapagliflozin propanediol (FARXIGA) 10 MG TABS tablet Take 1 tablet (10 mg total) by mouth daily before breakfast. 30 tablet 2   furosemide (LASIX) 20 MG tablet HOLD until seen by cardiology 90 tablet 3   glucose blood test strip USE TO CHECK BLOOD SUGARS 2 TIMES DAILY 100 strip 12   insulin glargine (LANTUS SOLOSTAR) 100 UNIT/ML Solostar Pen Inject 14 Units into the skin at bedtime. 15 mL 2   Insulin Syringe 27G X 1/2" 0.5 ML MISC Use with lantus 100 each 1   metFORMIN (GLUCOPHAGE) 1000 MG tablet Take 1 tablet (1,000 mg total) by mouth 2 (two) times daily with a meal. 120 tablet 2   sacubitril-valsartan (ENTRESTO) 24-26 MG Take 1 tablet by mouth 2 (two) times daily. 60 tablet 4   spironolactone (ALDACTONE) 25 MG tablet Take 0.5 tablets (12.5 mg total) by mouth daily. 30 tablet 4   TRUEplus Lancets 28G MISC Use to measure blood sugar twice a day 100 each 1   No current facility-administered medications for this visit.     Past Medical History:  Diagnosis Date   Cellulitis and abscess of foot 91/47/8295   Chronic systolic heart failure (HCC)    Cocaine abuse (HCC)    Cocaine dependence with cocaine-induced mood disorder ([place])    Depression    Diabetes mellitus without complication ([place])    Essential hypertension    Multiple closed fractures of ribs of both sides    NICM (nonischemic cardiomyopathy) ([place]) EF 20-25%    Suicidal ideations 07/12/2017    Past Surgical History:  Procedure Laterality Date  CARDIAC CATHETERIZATION N/A 04/30/2016   Procedure: Right/Left Heart Cath and Coronary Angiography;  Surgeon: Nelva Bush, MD;  Location: [place] [facility];  Service: Cardiovascular;  Laterality: N/A;   FINGER SURGERY     ICD IMPLANT N/A 12/16/2020   Procedure: ICD IMPLANT;  Surgeon: Vickie Epley, MD;  Location: [place] [facility];  Service: Cardiovascular;  Laterality: N/A;   LEFT HEART CATH AND CORONARY ANGIOGRAPHY N/A  12/12/2020   Procedure: LEFT HEART CATH AND CORONARY ANGIOGRAPHY;  Surgeon: Troy Sine, MD;  Location: [place] [facility];  Service: Cardiovascular;  Laterality: N/A;    Social History   Socioeconomic History   Marital status: Married    Spouse name: Not on file   Number of children: Not on file   Years of education: Not on file   Highest education level: Not on file  Occupational History   Not on file  Tobacco Use   Smoking status: Every Day    Packs/day: 0.50    Years: 23.00    Pack years: 11.50    Types: Cigarettes   Smokeless tobacco: Never   Tobacco comments:    Every other day   Vaping Use   Vaping Use: Never used  Substance and Sexual Activity   Alcohol use: Yes   Drug use: Not Currently    Types: Cocaine, "Crack" cocaine   Sexual activity: Not Currently  Other Topics Concern   Not on file  Social History Narrative   Not on file   Social Determinants of Health   Financial Resource Strain: Not on file  Food Insecurity: Not on file  Transportation Needs: Not on file  Physical Activity: Not on file  Stress: Not on file  Social Connections: Not on file  Intimate Partner Violence: Not on file    Family History  Problem Relation Age of Onset   Diabetes Other    Hypertension Other    Diabetes Mother    Brain cancer Father    Mental illness Brother    Drug abuse Brother    Diabetes Brother     ROS: no fevers or chills, productive cough, hemoptysis, dysphasia, odynophagia, melena, hematochezia, dysuria, hematuria, rash, seizure activity, orthopnea, PND, pedal edema, claudication. Remaining systems are negative.  Physical Exam: Well-developed well-nourished in no acute distress.  Skin is warm and dry.  HEENT is normal.  Neck is supple.  Chest is clear to auscultation with normal expansion.  ICD left chest Cardiovascular exam is regular rate and rhythm.  Abdominal exam nontender or distended. No masses palpated. Extremities show no edema. neuro  grossly intact   A/P  1 nonischemic cardiomyopathy-plan to resume Entresto 24/26 twice daily.  Continue carvedilol, Farxiga and spironolactone.  2 status post cardiac arrest-status post ICD.  Follow-up electrophysiology.  3 coronary artery disease-50% LAD noted on previous catheterization.  Continue aspirin and statin.  4 hypertension-blood pressure controlled.  Continue present medications and follow.  5 hyperlipidemia-continue statin.  6 tobacco abuse-patient has discontinued for the past 2 months.  7 history of cocaine use-he has not used in 18 months.  8 renal lesion-I have asked him to follow-up with primary care for this issue.  Kirk Ruths, MD

## 2021-02-04 ENCOUNTER — Other Ambulatory Visit: Payer: Self-pay

## 2021-02-04 ENCOUNTER — Ambulatory Visit (INDEPENDENT_AMBULATORY_CARE_PROVIDER_SITE_OTHER): Payer: Self-pay | Admitting: Cardiology

## 2021-02-04 ENCOUNTER — Encounter: Payer: Self-pay | Admitting: Cardiology

## 2021-02-04 ENCOUNTER — Encounter: Payer: Self-pay | Admitting: *Deleted

## 2021-02-04 VITALS — BP 136/64 | HR 81 | Ht 72.0 in | Wt 160.4 lb

## 2021-02-04 DIAGNOSIS — I428 Other cardiomyopathies: Secondary | ICD-10-CM

## 2021-02-04 DIAGNOSIS — E7800 Pure hypercholesterolemia, unspecified: Secondary | ICD-10-CM

## 2021-02-04 DIAGNOSIS — I5042 Chronic combined systolic (congestive) and diastolic (congestive) heart failure: Secondary | ICD-10-CM

## 2021-02-04 DIAGNOSIS — Z8674 Personal history of sudden cardiac arrest: Secondary | ICD-10-CM

## 2021-02-04 DIAGNOSIS — I10 Essential (primary) hypertension: Secondary | ICD-10-CM

## 2021-02-04 MED ORDER — SACUBITRIL-VALSARTAN 24-26 MG PO TABS
1.0000 | ORAL_TABLET | Freq: Two times a day (BID) | ORAL | 4 refills | Status: DC
  Filled 2021-02-04 – 2021-03-09 (×2): qty 60, 30d supply, fill #0
  Filled 2021-04-07: qty 60, 30d supply, fill #1

## 2021-02-04 MED ORDER — CARVEDILOL 25 MG PO TABS
25.0000 mg | ORAL_TABLET | Freq: Two times a day (BID) | ORAL | 6 refills | Status: DC
  Filled 2021-02-04 – 2021-03-09 (×2): qty 60, 30d supply, fill #0
  Filled 2021-04-07: qty 60, 30d supply, fill #1

## 2021-02-04 NOTE — Patient Instructions (Signed)
Medication Instructions:   RESTART ENTRESTO 24/26 MG ONE TABLET TWICE DAILY  *If you need a refill on your cardiac medications before your next appointment, please call your pharmacy*   Lab Work:  Your physician recommends that you return for lab work in: ONE WEEK  If you have labs (blood work) drawn today and your tests are completely normal, you will receive your results only by: [employer] (if you have MyChart) OR A paper copy in the mail If you have any lab test that is abnormal or we need to change your treatment, we will call you to review the results.   Follow-Up: At [facility], you and your health needs are our priority.  As part of our continuing mission to provide you with exceptional heart care, we have created designated Provider Care Teams.  These Care Teams include your primary Cardiologist (physician) and Advanced Practice Providers (APPs -  Physician Assistants and Nurse Practitioners) who all work together to provide you with the care you need, when you need it.  We recommend signing up for the patient portal called "MyChart".  Sign up information is provided on this After Visit Summary.  MyChart is used to connect with patients for Virtual Visits (Telemedicine).  Patients are able to view lab/test results, encounter notes, upcoming appointments, etc.  Non-urgent messages can be sent to your provider as well.   To learn more about what you can do with MyChart, go to [email or web address].    Your next appointment:   6 month(s)  The format for your next appointment:   In Person  Provider:   Olga Millers, MD

## 2021-02-11 ENCOUNTER — Other Ambulatory Visit: Payer: Self-pay

## 2021-02-23 ENCOUNTER — Telehealth: Payer: Self-pay | Admitting: Critical Care Medicine

## 2021-02-23 NOTE — Telephone Encounter (Signed)
Dr. Wright on vacation 11/21. Left vm for patient to call [phone] to reschedule appt. 

## 2021-03-02 ENCOUNTER — Ambulatory Visit: Payer: Self-pay | Admitting: Critical Care Medicine

## 2021-03-09 ENCOUNTER — Other Ambulatory Visit: Payer: Self-pay

## 2021-03-11 ENCOUNTER — Encounter: Payer: Self-pay | Admitting: *Deleted

## 2021-03-17 NOTE — Progress Notes (Signed)
Electrophysiology Office Follow up Visit Note:    Date:  03/18/2021   ID:  James [name]., DOB 03-02-1961, MRN [id number]  PCP:  Elsie Stain, MD  [facility] Cardiologist:  Kirk Ruths, MD  [facility] Electrophysiologist:  None    Interval History:    James [name]. is a 60 y.o. male who presents for a follow up visit.  He had an ICD implanted December 16, 2020 because of a history of VT/VF arrest.  His cardiomyopathy is nonischemic.  Device interrogation after implant shows a well-functioning device.  The patient saw Dr. Stanford Breed on February 04, 2021 in follow-up.  He has done well since implant.  He works at a [employer].  No trouble with the incision.  No shocks from the device.     Past Medical History:  Diagnosis Date   Cellulitis and abscess of foot 32/35/5732   Chronic systolic heart failure (HCC)    Cocaine abuse (HCC)    Cocaine dependence with cocaine-induced mood disorder ([place])    Depression    Diabetes mellitus without complication ([place])    Essential hypertension    Multiple closed fractures of ribs of both sides    NICM (nonischemic cardiomyopathy) ([place]) EF 20-25%    Suicidal ideations 07/12/2017    Past Surgical History:  Procedure Laterality Date   CARDIAC CATHETERIZATION N/A 04/30/2016   Procedure: Right/Left Heart Cath and Coronary Angiography;  Surgeon: Nelva Bush, MD;  Location: [place] [facility];  Service: Cardiovascular;  Laterality: N/A;   FINGER SURGERY     ICD IMPLANT N/A 12/16/2020   Procedure: ICD IMPLANT;  Surgeon: Vickie Epley, MD;  Location: [place] [facility];  Service: Cardiovascular;  Laterality: N/A;   LEFT HEART CATH AND CORONARY ANGIOGRAPHY N/A 12/12/2020   Procedure: LEFT HEART CATH AND CORONARY ANGIOGRAPHY;  Surgeon: Troy Sine, MD;  Location: [place] [facility];  Service: Cardiovascular;  Laterality: N/A;    Current Medications: Current Meds  Medication Sig   aspirin 81 MG chewable tablet  Chew 81 mg by mouth daily. 1Daily   atorvastatin (LIPITOR) 40 MG tablet Take 1 tablet (40 mg total) by mouth daily.   blood glucose meter kit and supplies Dispense based on patient and insurance preference. Use up to four times daily as directed. (FOR ICD-9 250.00, 250.01).   Blood Glucose Monitoring Suppl (TRUE METRIX METER) w/Device KIT Use to measure blood sugar twice a day   carvedilol (COREG) 25 MG tablet Take 1 tablet (25 mg total) by mouth 2 (two) times daily with a meal.   cholecalciferol (VITAMIN D3) 25 MCG (1000 UNIT) tablet Take 1,000 Units by mouth daily.   dapagliflozin propanediol (FARXIGA) 10 MG TABS tablet Take 1 tablet (10 mg total) by mouth daily before breakfast.   furosemide (LASIX) 20 MG tablet HOLD until seen by cardiology   glucose blood test strip USE TO CHECK BLOOD SUGARS 2 TIMES DAILY   insulin glargine (LANTUS SOLOSTAR) 100 UNIT/ML Solostar Pen Inject 14 Units into the skin at bedtime.   Insulin Syringe 27G X 1/2" 0.5 ML MISC Use with lantus   metFORMIN (GLUCOPHAGE) 1000 MG tablet Take 1 tablet (1,000 mg total) by mouth 2 (two) times daily with a meal.   sacubitril-valsartan (ENTRESTO) 24-26 MG Take 1 tablet by mouth 2 (two) times daily.   spironolactone (ALDACTONE) 25 MG tablet Take 0.5 tablets (12.5 mg total) by mouth daily.   TRUEplus Lancets 28G MISC Use to measure blood  sugar twice a day     Allergies:   Patient has no known allergies.   Social History   Socioeconomic History   Marital status: Married    Spouse name: Not on file   Number of children: Not on file   Years of education: Not on file   Highest education level: Not on file  Occupational History   Not on file  Tobacco Use   Smoking status: Every Day    Packs/day: 0.50    Years: 23.00    Pack years: 11.50    Types: Cigarettes   Smokeless tobacco: Never   Tobacco comments:    Every other day   Vaping Use   Vaping Use: Never used  Substance and Sexual Activity   Alcohol use: Yes   Drug  use: Not Currently    Types: Cocaine, "Crack" cocaine   Sexual activity: Not Currently  Other Topics Concern   Not on file  Social History Narrative   Not on file   Social Determinants of Health   Financial Resource Strain: Not on file  Food Insecurity: Not on file  Transportation Needs: Not on file  Physical Activity: Not on file  Stress: Not on file  Social Connections: Not on file     Family History: The patient's family history includes Brain cancer in his father; Diabetes in his brother, mother, and another family member; Drug abuse in his brother; Hypertension in an other family member; Mental illness in his brother.  ROS:   Please see the history of present illness.    All other systems reviewed and are negative.  EKGs/Labs/Other Studies Reviewed:    The following studies were reviewed today:   March 18, 2021 in clinic device interrogation personally reviewed Battery longevity 11.3 years 100% ventricular sensing 2 nonsustained VT episodes lasting 2 seconds each High-level patient activity per day Lead parameters are stable  EKG:  The ekg ordered today demonstrates sinus rhythm.  QTC is 480 ms.  Recent Labs: 06/19/2020: B Natriuretic Peptide 175.0 12/14/2020: Magnesium 2.0 12/22/2020: ALT 79; BUN 18; Creatinine, Ser 1.01; Hemoglobin 11.9; Platelets 302; Potassium 4.6; Sodium 139  Recent Lipid Panel    Component Value Date/Time   CHOL 112 12/12/2020 0029   TRIG 33 12/12/2020 0029   HDL 75 12/12/2020 0029   CHOLHDL 1.5 12/12/2020 0029   VLDL 7 12/12/2020 0029   LDLCALC 30 12/12/2020 0029    Physical Exam:    VS:  BP 132/68   Pulse 81   Ht 6' (1.829 m)   Wt 146 lb 3.2 oz (66.3 kg)   SpO2 98%   BMI 19.83 kg/m     Wt Readings from Last 3 Encounters:  03/18/21 146 lb 3.2 oz (66.3 kg)  02/04/21 160 lb 6.4 oz (72.8 kg)  12/29/20 153 lb (69.4 kg)     GEN:  Well nourished, well developed in no acute distress HEENT: Normal NECK: No JVD; No carotid  bruits LYMPHATICS: No lymphadenopathy CARDIAC: RRR, no murmurs, rubs, gallops.  ICD pocket well-healed RESPIRATORY:  Clear to auscultation without rales, wheezing or rhonchi  ABDOMEN: Soft, non-tender, non-distended MUSCULOSKELETAL:  No edema; No deformity  SKIN: Warm and dry NEUROLOGIC:  Alert and oriented x 3 PSYCHIATRIC:  Normal affect        ASSESSMENT:    1. Chronic combined systolic and diastolic heart failure ([place])   2. ICD (implantable cardioverter-defibrillator) in place    PLAN:    In order of problems listed above:  #  Chronic combined systolic and diastolic heart failure NYHA class II today.  Warm and dry on exam.  Physical exam and symptoms are discordant with the OptiVol index.  Continue spironolactone, Entresto, Lasix, Farxiga, Coreg  #ICD in situ Device functioning appropriately.  Continue remote monitoring.  Follow-up in clinic in 9 months or sooner as needed.    Medication Adjustments/Labs and Tests Ordered: Current medicines are reviewed at length with the patient today.  Concerns regarding medicines are outlined above.  Orders Placed This Encounter  Procedures   EKG 12-Lead   No orders of the defined types were placed in this encounter.    Signed, Lars Mage, MD, [facility], [facility] 03/18/2021 4:05 PM    Electrophysiology [facility]

## 2021-03-18 ENCOUNTER — Other Ambulatory Visit: Payer: Self-pay

## 2021-03-18 ENCOUNTER — Ambulatory Visit (INDEPENDENT_AMBULATORY_CARE_PROVIDER_SITE_OTHER): Payer: Self-pay | Admitting: Cardiology

## 2021-03-18 ENCOUNTER — Encounter: Payer: Self-pay | Admitting: Cardiology

## 2021-03-18 VITALS — BP 132/68 | HR 81 | Ht 72.0 in | Wt 146.2 lb

## 2021-03-18 DIAGNOSIS — Z95810 Presence of automatic (implantable) cardiac defibrillator: Secondary | ICD-10-CM

## 2021-03-18 DIAGNOSIS — I5042 Chronic combined systolic (congestive) and diastolic (congestive) heart failure: Secondary | ICD-10-CM

## 2021-03-18 NOTE — Patient Instructions (Addendum)
Medication Instructions:  Your physician recommends that you continue on your current medications as directed. Please refer to the Current Medication list given to you today. *If you need a refill on your cardiac medications before your next appointment, please call your pharmacy*  Lab Work: None ordered. If you have labs (blood work) drawn today and your tests are completely normal, you will receive your results only by: MyChart Message (if you have MyChart) OR A paper copy in the mail If you have any lab test that is abnormal or we need to change your treatment, we will call you to review the results.  Testing/Procedures: None ordered.  Follow-Up: At [facility], you and your health needs are our priority.  As part of our continuing mission to provide you with exceptional heart care, we have created designated Provider Care Teams.  These Care Teams include your primary Cardiologist (physician) and Advanced Practice Providers (APPs -  Physician Assistants and Nurse Practitioners) who all work together to provide you with the care you need, when you need it.  Your next appointment:   Your physician wants you to follow-up in: 9 months with Lanier Prude, MD.  Bonita Quin will receive a reminder letter in the mail two months in advance. If you don't receive a letter, please call our office to schedule the follow-up appointment.  Remote monitoring is used to monitor your ICD from home. This monitoring reduces the number of office visits required to check your device to one time per year. It allows [place] to keep an eye on the functioning of your device to ensure it is working properly. You are scheduled for a device check from home on 03/19/2021. You may send your transmission at any time that day. If you have a wireless device, the transmission will be sent automatically. After your physician reviews your transmission, you will receive a postcard with your next transmission date.

## 2021-03-19 ENCOUNTER — Ambulatory Visit (INDEPENDENT_AMBULATORY_CARE_PROVIDER_SITE_OTHER): Payer: Self-pay

## 2021-03-19 DIAGNOSIS — I428 Other cardiomyopathies: Secondary | ICD-10-CM

## 2021-03-19 LAB — CUP PACEART REMOTE DEVICE CHECK
Battery Remaining Longevity: 136 mo
Battery Voltage: 3.13 V
Brady Statistic RV Percent Paced: 0 %
Date Time Interrogation Session: 20221208033325
HighPow Impedance: 71 Ohm
Implantable Lead Implant Date: 20220906
Implantable Lead Location: 753860
Implantable Pulse Generator Implant Date: 20220906
Lead Channel Impedance Value: 285 Ohm
Lead Channel Impedance Value: 418 Ohm
Lead Channel Pacing Threshold Amplitude: 0.625 V
Lead Channel Pacing Threshold Pulse Width: 0.4 ms
Lead Channel Sensing Intrinsic Amplitude: 13.25 mV
Lead Channel Sensing Intrinsic Amplitude: 13.25 mV
Lead Channel Setting Pacing Amplitude: 2 V
Lead Channel Setting Pacing Pulse Width: 0.4 ms
Lead Channel Setting Sensing Sensitivity: 0.3 mV

## 2021-03-27 NOTE — Progress Notes (Signed)
Remote ICD transmission.   

## 2021-03-29 ENCOUNTER — Emergency Department (HOSPITAL_COMMUNITY)
Admission: EM | Admit: 2021-03-29 | Discharge: 2021-03-29 | Disposition: A | Discharge status: Home / Self Care | Payer: Self-pay | Attending: Emergency Medicine | Admitting: Emergency Medicine

## 2021-03-29 ENCOUNTER — Encounter (HOSPITAL_COMMUNITY): Payer: Self-pay

## 2021-03-29 DIAGNOSIS — I110 Hypertensive heart disease with heart failure: Secondary | ICD-10-CM | POA: Insufficient documentation

## 2021-03-29 DIAGNOSIS — E119 Type 2 diabetes mellitus without complications: Secondary | ICD-10-CM | POA: Insufficient documentation

## 2021-03-29 DIAGNOSIS — Z794 Long term (current) use of insulin: Secondary | ICD-10-CM | POA: Insufficient documentation

## 2021-03-29 DIAGNOSIS — Z7984 Long term (current) use of oral hypoglycemic drugs: Secondary | ICD-10-CM | POA: Insufficient documentation

## 2021-03-29 DIAGNOSIS — I5022 Chronic systolic (congestive) heart failure: Secondary | ICD-10-CM | POA: Insufficient documentation

## 2021-03-29 DIAGNOSIS — T50901A Poisoning by unspecified drugs, medicaments and biological substances, accidental (unintentional), initial encounter: Secondary | ICD-10-CM | POA: Insufficient documentation

## 2021-03-29 DIAGNOSIS — Z7982 Long term (current) use of aspirin: Secondary | ICD-10-CM | POA: Insufficient documentation

## 2021-03-29 DIAGNOSIS — F17210 Nicotine dependence, cigarettes, uncomplicated: Secondary | ICD-10-CM | POA: Insufficient documentation

## 2021-03-29 DIAGNOSIS — Z79899 Other long term (current) drug therapy: Secondary | ICD-10-CM | POA: Insufficient documentation

## 2021-03-29 DIAGNOSIS — R464 Slowness and poor responsiveness: Secondary | ICD-10-CM | POA: Insufficient documentation

## 2021-03-29 LAB — SALICYLATE LEVEL: Salicylate Lvl: <7 mg/dL — ABNORMAL LOW (ref 7.0–30.0)

## 2021-03-29 LAB — CBC WITH DIFFERENTIAL/PLATELET
Abs Immature Granulocytes: 0.02 10*3/uL (ref 0.00–0.07)
Basophils Absolute: 0 10*3/uL (ref 0.0–0.1)
Basophils Relative: 0 %
Eosinophils Absolute: 0.1 10*3/uL (ref 0.0–0.5)
Eosinophils Relative: 3 %
HCT: 42.2 % (ref 39.0–52.0)
Hemoglobin: 13.9 g/dL (ref 13.0–17.0)
Immature Granulocytes: 1 %
Lymphocytes Relative: 26 %
Lymphs Abs: 1 10*3/uL (ref 0.7–4.0)
MCH: 28.7 pg (ref 26.0–34.0)
MCHC: 32.9 g/dL (ref 30.0–36.0)
MCV: 87 fL (ref 80.0–100.0)
Monocytes Absolute: 0.3 10*3/uL (ref 0.1–1.0)
Monocytes Relative: 7 %
Neutro Abs: 2.4 10*3/uL (ref 1.7–7.7)
Neutrophils Relative %: 63 %
Platelets: 178 10*3/uL (ref 150–400)
RBC: 4.85 MIL/uL (ref 4.22–5.81)
RDW: 14.4 % (ref 11.5–15.5)
WBC: 3.8 10*3/uL — ABNORMAL LOW (ref 4.0–10.5)
nRBC: 0 % (ref 0.0–0.2)

## 2021-03-29 LAB — RAPID URINE DRUG SCREEN, HOSP PERFORMED
Amphetamines: NOT DETECTED
Barbiturates: NOT DETECTED
Benzodiazepines: NOT DETECTED
Cocaine: POSITIVE — AB
Opiates: NOT DETECTED
Tetrahydrocannabinol: NOT DETECTED

## 2021-03-29 LAB — COMPREHENSIVE METABOLIC PANEL
ALT: 108 U/L — ABNORMAL HIGH (ref 0–44)
AST: 265 U/L — ABNORMAL HIGH (ref 15–41)
Albumin: 4.2 g/dL (ref 3.5–5.0)
Alkaline Phosphatase: 110 U/L (ref 38–126)
Anion gap: 13 (ref 5–15)
BUN: 16 mg/dL (ref 8–23)
CO2: 27 mmol/L (ref 22–32)
Calcium: 9.2 mg/dL (ref 8.9–10.3)
Chloride: 92 mmol/L — ABNORMAL LOW (ref 98–111)
Creatinine, Ser: 1.28 mg/dL — ABNORMAL HIGH (ref 0.61–1.24)
GFR, Estimated: >60 mL/min (ref >60)
Glucose, Bld: 560 mg/dL (ref 70–99)
Potassium: 4.3 mmol/L (ref 3.5–5.1)
Sodium: 132 mmol/L — ABNORMAL LOW (ref 135–145)
Total Bilirubin: 0.7 mg/dL (ref 0.3–1.2)
Total Protein: 7.3 g/dL (ref 6.5–8.1)

## 2021-03-29 LAB — URINALYSIS, ROUTINE W REFLEX MICROSCOPIC
Bilirubin Urine: NEGATIVE
Glucose, UA: >=500 mg/dL — AB
Ketones, ur: 5 mg/dL — AB
Leukocytes,Ua: NEGATIVE
Nitrite: NEGATIVE
Protein, ur: 30 mg/dL — AB
Specific Gravity, Urine: 1.026 (ref 1.005–1.030)
pH: 5 (ref 5.0–8.0)

## 2021-03-29 LAB — TROPONIN I (HIGH SENSITIVITY)
Troponin I (High Sensitivity): 16 ng/L (ref <18)
Troponin I (High Sensitivity): 23 ng/L — ABNORMAL HIGH (ref <18)
Troponin I (High Sensitivity): 23 ng/L — ABNORMAL HIGH (ref <18)
Troponin I (High Sensitivity): 26 ng/L — ABNORMAL HIGH (ref <18)

## 2021-03-29 LAB — ACETAMINOPHEN LEVEL: Acetaminophen (Tylenol), Serum: <10 ug/mL — ABNORMAL LOW (ref 10–30)

## 2021-03-29 LAB — ETHANOL: Alcohol, Ethyl (B): <10 mg/dL (ref <10)

## 2021-03-29 LAB — CBG MONITORING, ED
Glucose-Capillary: 443 mg/dL — ABNORMAL HIGH (ref 70–99)
Glucose-Capillary: 202 mg/dL — ABNORMAL HIGH (ref 70–99)

## 2021-03-29 LAB — LACTIC ACID, PLASMA
Lactic Acid, Venous: 1.9 mmol/L (ref 0.5–1.9)
Lactic Acid, Venous: 1.3 mmol/L (ref 0.5–1.9)

## 2021-03-29 MED ORDER — NALOXONE HCL 2 MG/2ML IJ SOSY
PREFILLED_SYRINGE | INTRAMUSCULAR | Status: AC
  Filled 2021-03-29: qty 2

## 2021-03-29 MED ORDER — INSULIN ASPART 100 UNIT/ML IJ SOLN
10.0000 [IU] | Freq: Once | INTRAMUSCULAR | Status: AC
  Administered 2021-03-29: 07:00:00 10 [IU] via INTRAVENOUS
  Filled 2021-03-29: qty 0.1

## 2021-03-29 MED ORDER — SODIUM CHLORIDE 0.9 % IV BOLUS
1000.0000 mL | Freq: Once | INTRAVENOUS | Status: AC
  Administered 2021-03-29: 06:00:00 1000 mL via INTRAVENOUS

## 2021-03-29 NOTE — ED Notes (Signed)
Trying to secure transport.

## 2021-03-29 NOTE — ED Notes (Signed)
Lunch provided.

## 2021-03-29 NOTE — ED Provider Notes (Signed)
[facility] DEPT Provider Note   CSN: [id number] Arrival date & time: 03/29/21  0532     History Chief Complaint  Patient presents with   Drug Overdose    Legend Tumminello. is a 60 y.o. male.  Patient is a 60 year old male with extensive past medical history including nonischemic cardiomyopathy with decreased EF and AICD device.  Patient also has history of diabetes, hypertension, cardiac arrest, and polysubstance abuse.  Patient brought by EMS after being found unresponsive in station.  Bystanders apparently removed him from the car, and then initiated CPR.  EMS was called and patient was given 0.4 of Narcan.  He then became responsive and was transported here.  Patient admits to using crack, but denies other substance use tonight.  The history is provided by the patient.      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   History of tobacco use 12/11/2019   MDD (major depressive disorder) 01/14/2017   Cardiomyopathy- suspect NICM but etiology not yet determined 04/27/2016   Chronic combined systolic and diastolic heart failure ([place]) 04/27/2016   Uncontrolled type 2 diabetes mellitus with hyperglycemia ([place]) 04/26/2016   History of alcohol use 12/12/2015   History of cocaine abuse ([place]) 06/25/2015   HYPERCHOLESTEROLEMIA 05/09/2007   ALLERGIC RHINITIS 12/21/2006   Essential hypertension  12/07/2006    Past Surgical History:  Procedure Laterality Date   CARDIAC CATHETERIZATION N/A 04/30/2016   Procedure: Right/Left Heart Cath and Coronary Angiography;  Surgeon: Nelva Bush, MD;  Location: [facility];  Service: Cardiovascular;  Laterality: N/A;   FINGER SURGERY     ICD IMPLANT N/A 12/16/2020   Procedure: ICD IMPLANT;  Surgeon: Vickie Epley, MD;  Location: [facility];  Service: Cardiovascular;  Laterality: N/A;   LEFT HEART CATH AND CORONARY ANGIOGRAPHY N/A 12/12/2020   Procedure: LEFT HEART CATH AND CORONARY ANGIOGRAPHY;  Surgeon: Troy Sine, MD;  Location: [facility];  Service: Cardiovascular;  Laterality: N/A;       Family History  Problem Relation Age of Onset   Diabetes Other    Hypertension Other    Diabetes Mother    Brain cancer Father    Mental illness Brother    Drug abuse Brother    Diabetes Brother     Social History   Tobacco Use   Smoking status: Every Day    Packs/day: 0.50    Years: 23.00    Pack years: 11.50    Types: Cigarettes   Smokeless tobacco: Never   Tobacco comments:    Every other day   Vaping Use   Vaping Use: Never used  Substance Use Topics   Alcohol use: Yes   Drug use: Not Currently    Types: Cocaine, "Crack" cocaine    Home Medications Prior to Admission medications   Medication Sig Start Date  End Date Taking? Authorizing Provider  aspirin 81 MG chewable tablet Chew 81 mg by mouth daily. 1Daily    [provider]  atorvastatin (LIPITOR) 40 MG tablet Take 1 tablet (40 mg total) by mouth daily. 12/23/20   Elsie Stain, MD  blood glucose meter kit and supplies Dispense based on patient and insurance preference. Use up to four times daily as directed. (FOR ICD-9 250.00, 250.01). 07/17/19   Mayers, Cari S, PA-C  Blood Glucose Monitoring Suppl (TRUE METRIX METER) w/Device KIT Use to measure blood sugar twice a day 12/11/19   Elsie Stain, MD  carvedilol (COREG) 25 MG tablet  Take 1 tablet (25 mg total) by mouth 2 (two) times daily with a meal. 02/04/21   Lelon Perla, MD  cholecalciferol (VITAMIN D3) 25 MCG (1000 UNIT) tablet Take 1,000 Units by mouth daily.    [provider]  dapagliflozin propanediol (FARXIGA) 10 MG TABS tablet Take 1 tablet (10 mg total) by mouth daily before breakfast. 12/29/20   Duke, Tami Lin, PA  furosemide (LASIX) 20 MG tablet HOLD until seen by cardiology 12/22/20   Elsie Stain, MD  glucose blood test strip USE TO CHECK BLOOD SUGARS 2 TIMES DAILY 07/29/20   Elsie Stain, MD  insulin glargine (LANTUS SOLOSTAR) 100 UNIT/ML Solostar Pen Inject 14 Units into the skin at bedtime. 12/22/20   Elsie Stain, MD  Insulin Syringe 27G X 1/2" 0.5 ML MISC Use with lantus 12/11/19   Elsie Stain, MD  metFORMIN (GLUCOPHAGE) 1000 MG tablet Take 1 tablet (1,000 mg total) by mouth 2 (two) times daily with a meal. 12/22/20 04/08/21  Elsie Stain, MD  sacubitril-valsartan (ENTRESTO) 24-26 MG Take 1 tablet by mouth 2 (two) times daily. 02/04/21   Lelon Perla, MD  spironolactone (ALDACTONE) 25 MG tablet Take 0.5 tablets (12.5 mg total) by mouth daily. 12/22/20   Elsie Stain, MD  TRUEplus Lancets 28G MISC Use to measure blood sugar twice a day 07/29/20   Elsie Stain, MD    Allergies    Patient has no known allergies.  Review of Systems   Review of Systems  All other systems reviewed and are negative.  Physical Exam Updated Vital Signs BP 121/76 (BP Location: Left Arm)    Pulse (!) 103    Temp 97.8 F (36.6 C) (Oral)    Resp 14    Ht 6' (1.829 m)    Wt 71.2 kg    SpO2 98%    BMI 21.29 kg/m   Physical Exam Vitals and nursing note reviewed.  Constitutional:      General: He is not in acute distress.    Appearance: He is well-developed. He is not diaphoretic.  HENT:     Head: Normocephalic and atraumatic.  Cardiovascular:     Rate and Rhythm: Normal rate and regular rhythm.     Heart sounds: No  murmur heard.   No friction rub.  Pulmonary:     Effort: Pulmonary effort is normal. No respiratory distress.     Breath sounds: Normal breath sounds. No wheezing or rales.  Abdominal:     General: Bowel sounds are normal. There is no distension.     Palpations: Abdomen is soft.     Tenderness: There is no abdominal tenderness.  Musculoskeletal:        General: Normal range of motion.     Cervical back: Normal range of motion and neck supple.  Skin:  General: Skin is warm and dry.  Neurological:     Mental Status: He is alert and oriented to person, place, and time.     Coordination: Coordination normal.     Comments: Patient is somnolent, but arousable.  He responds to questions and commands appropriately.    ED Results / Procedures / Treatments   Labs (all labs ordered are listed, but only abnormal results are displayed) Labs Reviewed - No data to display  EKG EKG Interpretation  Date/Time:  Sunday March 29 2021 06:24:09 EST Ventricular Rate:  103 PR Interval:  151 QRS Duration: 85 QT Interval:  410 QTC Calculation: 537 R Axis:   84 Text Interpretation: Sinus tachycardia Atrial premature complex Biatrial enlargement Borderline right axis deviation Left ventricular hypertrophy Nonspecific T abnormalities, lateral leads Prolonged QT interval Confirmed by Veryl Speak [phone]) on 03/29/2021 6:29:40 AM  Radiology No results found.  Procedures Procedures   Medications Ordered in ED Medications  naloxone (NARCAN) 2 MG/2ML injection (has no administration in time range)    ED Course  I have reviewed the triage vital signs and the nursing notes.  Pertinent labs & imaging results that were available during my care of the patient were reviewed by me and considered in my medical decision making (see chart for details).    MDM Rules/Calculators/A&P  Patient is a 60 year old male brought by EMS for drug overdose.  Patient was found unresponsive in a car at a gas  station.  He was removed from the car by bystanders, then brief CPR was performed.  Patient became more alert after receiving Narcan.  He became less alert shortly after arriving to the ER and a second dose of Narcan was administered.  At this time, patient is now awake, alert, and responsive.  He will be monitored.  Laboratory studies are pending.  Anticipate discharge after a period of observation and metabolism.  Care signed out to oncoming provider at shift change.  Final Clinical Impression(s) / ED Diagnoses Final diagnoses:  None    Rx / DC Orders ED Discharge Orders     None        Veryl Speak, MD 04/01/21 0230

## 2021-03-29 NOTE — ED Triage Notes (Signed)
Pt to ED via [facility] after overdose in car at gas station parking lot.  EMS found pt on ground with bystanders performing CPR.  Pt had pulse but did need rescue breathing.  Narcan 0.5mg  administered and pt became alert.

## 2021-03-29 NOTE — ED Notes (Signed)
Date and time results received: 03/29/21 0651 (use smartphrase ".now" to insert current time)  Test: glucose Critical Value: 560  Name of Provider Notified: Brandon Melnick, MD  Orders Received? Or Actions Taken?:  n/a

## 2021-03-29 NOTE — ED Notes (Signed)
Patient ambulated. No concerning vitals, and no reports of weakness. Given a sandwich and diet ginger ale.

## 2021-03-29 NOTE — ED Notes (Signed)
Pt aware urine sample needed 
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
## 2021-03-29 NOTE — Discharge Instructions (Signed)
Check your blood sugars frequently.  Your blood sugar was elevated in the ED but has improved with insulin and IV fluids.  I have included some information about substance abuse treatment centers.  Return to emergency room if you have any worsening symptoms.

## 2021-04-06 ENCOUNTER — Other Ambulatory Visit: Payer: Self-pay

## 2021-04-06 ENCOUNTER — Emergency Department (HOSPITAL_COMMUNITY)
Admission: EM | Admit: 2021-04-06 | Discharge: 2021-04-06 | Disposition: A | Discharge status: Home / Self Care | Payer: Self-pay | Attending: Student | Admitting: Student

## 2021-04-06 DIAGNOSIS — R059 Cough, unspecified: Secondary | ICD-10-CM | POA: Insufficient documentation

## 2021-04-06 DIAGNOSIS — Z20822 Contact with and (suspected) exposure to covid-19: Secondary | ICD-10-CM | POA: Insufficient documentation

## 2021-04-06 DIAGNOSIS — M791 Myalgia, unspecified site: Secondary | ICD-10-CM | POA: Insufficient documentation

## 2021-04-06 DIAGNOSIS — Z5321 Procedure and treatment not carried out due to patient leaving prior to being seen by health care provider: Secondary | ICD-10-CM | POA: Insufficient documentation

## 2021-04-06 LAB — RESP PANEL BY RT-PCR (FLU A&B, COVID) ARPGX2
Influenza A by PCR: NEGATIVE
Influenza B by PCR: NEGATIVE
SARS Coronavirus 2 by RT PCR: NEGATIVE

## 2021-04-06 NOTE — ED Triage Notes (Signed)
Pt c/o body aches and a cough for the past 2 days.

## 2021-04-06 NOTE — ED Notes (Signed)
Pt called for vitals recheck x3. No response.  

## 2021-04-07 ENCOUNTER — Other Ambulatory Visit: Payer: Self-pay

## 2021-04-09 ENCOUNTER — Encounter (HOSPITAL_COMMUNITY): Payer: Self-pay

## 2021-04-09 ENCOUNTER — Emergency Department (HOSPITAL_COMMUNITY): Payer: Self-pay

## 2021-04-09 ENCOUNTER — Other Ambulatory Visit: Payer: Self-pay

## 2021-04-09 ENCOUNTER — Emergency Department (HOSPITAL_COMMUNITY)
Admission: EM | Admit: 2021-04-09 | Discharge: 2021-04-09 | Disposition: A | Discharge status: Home / Self Care | Payer: Self-pay | Attending: Emergency Medicine | Admitting: Emergency Medicine

## 2021-04-09 DIAGNOSIS — Z20822 Contact with and (suspected) exposure to covid-19: Secondary | ICD-10-CM | POA: Insufficient documentation

## 2021-04-09 DIAGNOSIS — F17210 Nicotine dependence, cigarettes, uncomplicated: Secondary | ICD-10-CM | POA: Insufficient documentation

## 2021-04-09 DIAGNOSIS — J189 Pneumonia, unspecified organism: Secondary | ICD-10-CM

## 2021-04-09 DIAGNOSIS — E1165 Type 2 diabetes mellitus with hyperglycemia: Secondary | ICD-10-CM | POA: Insufficient documentation

## 2021-04-09 DIAGNOSIS — Z7984 Long term (current) use of oral hypoglycemic drugs: Secondary | ICD-10-CM | POA: Insufficient documentation

## 2021-04-09 DIAGNOSIS — Z79899 Other long term (current) drug therapy: Secondary | ICD-10-CM | POA: Insufficient documentation

## 2021-04-09 DIAGNOSIS — R739 Hyperglycemia, unspecified: Secondary | ICD-10-CM

## 2021-04-09 DIAGNOSIS — J188 Other pneumonia, unspecified organism: Secondary | ICD-10-CM | POA: Insufficient documentation

## 2021-04-09 DIAGNOSIS — Z7982 Long term (current) use of aspirin: Secondary | ICD-10-CM | POA: Insufficient documentation

## 2021-04-09 DIAGNOSIS — Z951 Presence of aortocoronary bypass graft: Secondary | ICD-10-CM | POA: Insufficient documentation

## 2021-04-09 DIAGNOSIS — I5042 Chronic combined systolic (congestive) and diastolic (congestive) heart failure: Secondary | ICD-10-CM | POA: Insufficient documentation

## 2021-04-09 DIAGNOSIS — Z794 Long term (current) use of insulin: Secondary | ICD-10-CM | POA: Insufficient documentation

## 2021-04-09 DIAGNOSIS — I110 Hypertensive heart disease with heart failure: Secondary | ICD-10-CM | POA: Insufficient documentation

## 2021-04-09 LAB — CBC WITH DIFFERENTIAL/PLATELET
Abs Immature Granulocytes: 0.1 10*3/uL — ABNORMAL HIGH (ref 0.00–0.07)
Basophils Absolute: 0 10*3/uL (ref 0.0–0.1)
Basophils Relative: 0 %
Eosinophils Absolute: 0 10*3/uL (ref 0.0–0.5)
Eosinophils Relative: 0 %
HCT: 39.2 % (ref 39.0–52.0)
Hemoglobin: 13.1 g/dL (ref 13.0–17.0)
Immature Granulocytes: 1 %
Lymphocytes Relative: 7 %
Lymphs Abs: 0.9 10*3/uL (ref 0.7–4.0)
MCH: 28.6 pg (ref 26.0–34.0)
MCHC: 33.4 g/dL (ref 30.0–36.0)
MCV: 85.6 fL (ref 80.0–100.0)
Monocytes Absolute: 0.5 10*3/uL (ref 0.1–1.0)
Monocytes Relative: 4 %
Neutro Abs: 12.2 10*3/uL — ABNORMAL HIGH (ref 1.7–7.7)
Neutrophils Relative %: 88 %
Platelets: 386 10*3/uL (ref 150–400)
RBC: 4.58 MIL/uL (ref 4.22–5.81)
RDW: 14.7 % (ref 11.5–15.5)
WBC: 13.7 10*3/uL — ABNORMAL HIGH (ref 4.0–10.5)
nRBC: 0 % (ref 0.0–0.2)

## 2021-04-09 LAB — COMPREHENSIVE METABOLIC PANEL
ALT: 13 U/L (ref 0–44)
AST: 11 U/L — ABNORMAL LOW (ref 15–41)
Albumin: 2.8 g/dL — ABNORMAL LOW (ref 3.5–5.0)
Alkaline Phosphatase: 94 U/L (ref 38–126)
Anion gap: 13 (ref 5–15)
BUN: 30 mg/dL — ABNORMAL HIGH (ref 8–23)
CO2: 26 mmol/L (ref 22–32)
Calcium: 8.9 mg/dL (ref 8.9–10.3)
Chloride: 91 mmol/L — ABNORMAL LOW (ref 98–111)
Creatinine, Ser: 0.93 mg/dL (ref 0.61–1.24)
GFR, Estimated: >60 mL/min (ref >60)
Glucose, Bld: 531 mg/dL (ref 70–99)
Potassium: 5.3 mmol/L — ABNORMAL HIGH (ref 3.5–5.1)
Sodium: 130 mmol/L — ABNORMAL LOW (ref 135–145)
Total Bilirubin: 1 mg/dL (ref 0.3–1.2)
Total Protein: 6.9 g/dL (ref 6.5–8.1)

## 2021-04-09 LAB — LIPASE, BLOOD: Lipase: 70 U/L — ABNORMAL HIGH (ref 11–51)

## 2021-04-09 LAB — CBG MONITORING, ED: Glucose-Capillary: 472 mg/dL — ABNORMAL HIGH (ref 70–99)

## 2021-04-09 LAB — BRAIN NATRIURETIC PEPTIDE: B Natriuretic Peptide: 84 pg/mL (ref 0.0–100.0)

## 2021-04-09 LAB — RESP PANEL BY RT-PCR (FLU A&B, COVID) ARPGX2
Influenza A by PCR: NEGATIVE
Influenza B by PCR: NEGATIVE
SARS Coronavirus 2 by RT PCR: NEGATIVE

## 2021-04-09 MED ORDER — AZITHROMYCIN 250 MG PO TABS
250.0000 mg | ORAL_TABLET | Freq: Every day | ORAL | 0 refills | Status: DC
  Filled 2021-04-09: qty 6, 6d supply, fill #0

## 2021-04-09 MED ORDER — SODIUM CHLORIDE 0.9 % IV SOLN: INTRAVENOUS | Status: DC

## 2021-04-09 MED ORDER — INSULIN ASPART 100 UNIT/ML IJ SOLN
10.0000 [IU] | Freq: Once | INTRAMUSCULAR | Status: AC
  Administered 2021-04-09: 22:00:00 10 [IU] via INTRAVENOUS
  Filled 2021-04-09: qty 0.1

## 2021-04-09 MED ORDER — ALBUTEROL SULFATE HFA 108 (90 BASE) MCG/ACT IN AERS: 2.0000 | INHALATION_SPRAY | RESPIRATORY_TRACT | Status: DC | PRN

## 2021-04-09 MED ORDER — SODIUM CHLORIDE 0.9 % IV BOLUS
500.0000 mL | Freq: Once | INTRAVENOUS | Status: AC
  Administered 2021-04-09: 22:00:00 500 mL via INTRAVENOUS

## 2021-04-09 MED ORDER — DM-GUAIFENESIN ER 30-600 MG PO TB12: 1.0000 | ORAL_TABLET | Freq: Two times a day (BID) | ORAL | 1 refills | Status: DC

## 2021-04-09 MED ORDER — SODIUM CHLORIDE 0.9 % IV SOLN
500.0000 mg | Freq: Once | INTRAVENOUS | Status: AC
  Administered 2021-04-09: 23:00:00 500 mg via INTRAVENOUS
  Filled 2021-04-09: qty 5

## 2021-04-09 MED ORDER — SODIUM CHLORIDE 0.9 % IV SOLN
1.0000 g | Freq: Once | INTRAVENOUS | Status: AC
  Administered 2021-04-09: 22:00:00 1 g via INTRAVENOUS
  Filled 2021-04-09: qty 10

## 2021-04-09 MED ORDER — AZITHROMYCIN 250 MG PO TABS: 250.0000 mg | ORAL_TABLET | Freq: Every day | ORAL | 0 refills | Status: DC

## 2021-04-09 NOTE — ED Provider Notes (Signed)
Emergency Medicine Provider Triage Evaluation Note  James [name]. , a 60 y.o. male  was evaluated in triage.  Pt complains of shortness of breath with productive green sputum cough, chills, body aches, generalized weakness, and NBNB emesis yesterday.  Symptoms began 2 days ago..  Review of Systems  Positive: Cough with green phlegm, chills, weakness, body aches, vomiting with NBNB emesis Negative: Diarrhea, chest pain  Physical Exam  BP (!) 108/57 (BP Location: Left Arm)    Pulse 89    Temp 98.2 F (36.8 C) (Oral)    Resp 18    Ht 6' (1.829 m)    Wt 71.2 kg    SpO2 95%    BMI 21.29 kg/m  Gen:   Awake, ill-appearing Resp:  Normal effort tachypneic, rales in the left lung base MSK:   Moves extremities without difficulty  Other:  RRR no R/G.  Medical Decision Making  Medically screening exam initiated at 6:24 PM.  Appropriate orders placed.  James Jessy Oto. was informed that the remainder of the evaluation will be completed by another provider, this initial triage assessment does not replace that evaluation, and the importance of remaining in the ED until their evaluation is complete..  This chart was dictated using voice recognition software, Dragon. Despite the best efforts of this provider to proofread and correct errors, errors may still occur which can change documentation meaning.     Sherrilee Gilles 04/09/21 Corrie Mckusick, MD 04/10/21 0001

## 2021-04-09 NOTE — ED Provider Notes (Addendum)
[facility] DEPT Provider Note   CSN: [id number] Arrival date & time: 04/09/21  1739     History Chief Complaint  Patient presents with   Shortness of Breath   Cough    James [name]. is a 60 y.o. male.  Patient brought in by family member.  Patient with a complaint of shortness of breath productive cough green sputum chills weakness body aches and occasional vomiting since yesterday.  But no vomiting today.  Patient afebrile here temp 98.8.  Oxygen saturations mid 90s on room air.  Not tachypneic.  Not tachycardic.  Past medical history significant for diabetes without complications hypertension and cocaine abuse and chronic systolic heart failure.      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   History of tobacco use 12/11/2019   MDD (major depressive disorder) 01/14/2017   Cardiomyopathy- suspect NICM but etiology not yet determined 04/27/2016   Chronic combined systolic and diastolic heart failure ([place]) 04/27/2016   Uncontrolled type 2 diabetes mellitus with hyperglycemia ([place]) 04/26/2016   History of alcohol use 12/12/2015   History of cocaine abuse ([place]) 06/25/2015   HYPERCHOLESTEROLEMIA 05/09/2007   ALLERGIC RHINITIS 12/21/2006   Essential hypertension 12/07/2006    Past Surgical History:  Procedure Laterality Date   CARDIAC CATHETERIZATION N/A 04/30/2016   Procedure:  Right/Left Heart Cath and Coronary Angiography;  Surgeon: Nelva Bush, MD;  Location: [facility];  Service: Cardiovascular;  Laterality: N/A;   FINGER SURGERY     ICD IMPLANT N/A 12/16/2020   Procedure: ICD IMPLANT;  Surgeon: Vickie Epley, MD;  Location: [facility];  Service: Cardiovascular;  Laterality: N/A;   LEFT HEART CATH AND CORONARY ANGIOGRAPHY N/A 12/12/2020   Procedure: LEFT HEART CATH AND CORONARY ANGIOGRAPHY;  Surgeon: Troy Sine, MD;  Location: [facility];  Service: Cardiovascular;  Laterality: N/A;       Family History  Problem Relation Age of Onset   Diabetes Other    Hypertension Other    Diabetes Mother    Brain cancer Father    Mental illness Brother    Drug abuse Brother    Diabetes Brother     Social History   Tobacco Use   Smoking status: Every Day    Packs/day: 0.50    Years: 23.00    Pack years: 11.50    Types: Cigarettes   Smokeless tobacco: Never   Tobacco comments:    Every other day   Vaping Use   Vaping Use: Never used  Substance Use Topics   Alcohol use: Yes   Drug use: Not Currently    Types: Cocaine, "Crack" cocaine    Home Medications Prior to Admission medications   Medication Sig Start Date End Date Taking? Authorizing Provider  aspirin 81 MG chewable tablet Chew 81 mg by mouth daily. 1Daily  [provider]  atorvastatin (LIPITOR) 40 MG tablet Take 1 tablet (40 mg total) by mouth daily. 12/23/20   Elsie Stain, MD  blood glucose meter kit and supplies Dispense based on patient and insurance preference. Use up to four times daily as directed. (FOR ICD-9 250.00, 250.01). 07/17/19   Mayers, Cari S, PA-C  Blood Glucose Monitoring Suppl (TRUE METRIX METER) w/Device KIT Use to measure blood sugar twice a day 12/11/19   Elsie Stain, MD  carvedilol (COREG) 25 MG tablet Take 1 tablet (25 mg total) by mouth 2 (two) times daily with a meal. 02/04/21   Lelon Perla, MD  cholecalciferol  (VITAMIN D3) 25 MCG (1000 UNIT) tablet Take 1,000 Units by mouth daily.    [provider]  dapagliflozin propanediol (FARXIGA) 10 MG TABS tablet Take 1 tablet (10 mg total) by mouth daily before breakfast. 12/29/20   D[facility], Tami Lin, PA  furosemide (LASIX) 20 MG tablet HOLD until seen by cardiology 12/22/20   Elsie Stain, MD  glucose blood test strip USE TO CHECK BLOOD SUGARS 2 TIMES DAILY 07/29/20   Elsie Stain, MD  insulin glargine (LANTUS SOLOSTAR) 100 UNIT/ML Solostar Pen Inject 14 Units into the skin at bedtime. 12/22/20   Elsie Stain, MD  Insulin Syringe 27G X 1/2" 0.5 ML MISC Use with lantus 12/11/19   Elsie Stain, MD  metFORMIN (GLUCOPHAGE) 1000 MG tablet Take 1 tablet (1,000 mg total) by mouth 2 (two) times daily with a meal. 12/22/20 04/08/21  Elsie Stain, MD  sacubitril-valsartan (ENTRESTO) 24-26 MG Take 1 tablet by mouth 2 (two) times daily. 02/04/21   Lelon Perla, MD  spironolactone (ALDACTONE) 25 MG tablet Take 0.5 tablets (12.5 mg total) by mouth daily. 12/22/20   Elsie Stain, MD  TRUEplus Lancets 28G MISC Use to measure blood sugar twice a day 07/29/20   Elsie Stain, MD    Allergies    Patient has no known allergies.  Review of Systems   Review of Systems  Constitutional:  Positive for chills. Negative for fever.  HENT:  Negative for ear pain and sore throat.   Eyes:  Negative for pain and visual disturbance.  Respiratory:  Positive for cough and shortness of breath.   Cardiovascular:  Negative for chest pain, palpitations and leg swelling.  Gastrointestinal:  Positive for nausea and vomiting. Negative for abdominal pain.  Genitourinary:  Negative for dysuria and hematuria.  Musculoskeletal:  Positive for myalgias. Negative for arthralgias and back pain.  Skin:  Negative for color change and rash.  Neurological:  Negative for seizures and syncope.  All other systems reviewed and are negative.  Physical Exam Updated  Vital Signs BP 122/73    Pulse 92    Temp 98.8 F (37.1 C) (Oral)    Resp 16    Ht 1.829 m (6')    Wt 71.2 kg    SpO2 91%    BMI 21.29 kg/m   Physical Exam Vitals and nursing note reviewed.  Constitutional:      General: He is not in acute distress.    Appearance: Normal appearance. He is well-developed. He is not toxic-appearing.  HENT:     Head: Normocephalic and atraumatic.  Eyes:     Extraocular Movements: Extraocular movements intact.     Conjunctiva/sclera: Conjunctivae normal.     Pupils: Pupils are equal, round, and reactive to light.  Cardiovascular:     Rate and Rhythm: Normal rate  and regular rhythm.     Heart sounds: No murmur heard. Pulmonary:     Effort: Pulmonary effort is normal. No respiratory distress.     Breath sounds: Normal breath sounds. No wheezing, rhonchi or rales.  Abdominal:     Palpations: Abdomen is soft.     Tenderness: There is no abdominal tenderness.  Musculoskeletal:        General: No swelling.     Cervical back: Normal range of motion and neck supple.     Right lower leg: No edema.     Left lower leg: No edema.  Skin:    General: Skin is warm and dry.     Capillary Refill: Capillary refill takes less than 2 seconds.  Neurological:     General: No focal deficit present.     Mental Status: He is alert and oriented to person, place, and time.  Psychiatric:        Mood and Affect: Mood normal.    ED Results / Procedures / Treatments   Labs (all labs ordered are listed, but only abnormal results are displayed) Labs Reviewed  CBC WITH DIFFERENTIAL/PLATELET - Abnormal; Notable for the following components:      Result Value   WBC 13.7 (*)    Neutro Abs 12.2 (*)    Abs Immature Granulocytes 0.10 (*)    All other components within normal limits  COMPREHENSIVE METABOLIC PANEL - Abnormal; Notable for the following components:   Sodium 130 (*)    Potassium 5.3 (*)    Chloride 91 (*)    Glucose, Bld 531 (*)    BUN 30 (*)    Albumin 2.8  (*)    AST 11 (*)    All other components within normal limits  LIPASE, BLOOD - Abnormal; Notable for the following components:   Lipase 70 (*)    All other components within normal limits  RESP PANEL BY RT-PCR (FLU A&B, COVID) [id number]    EKG EKG Interpretation  Date/Time:  Thursday April 09 2021 17:54:25 EST Ventricular Rate:  92 PR Interval:  126 QRS Duration: 90 QT Interval:  374 QTC Calculation: 463 R Axis:   86 Text Interpretation: Sinus rhythm Multiple ventricular premature complexes Biatrial enlargement Anterior infarct, old Confirmed by Fredia Sorrow [phone]) on 04/09/2021 9:01:13 PM  Radiology DG Chest 2 View  Result Date: 04/09/2021 CLINICAL DATA:  Shortness of breath, productive cough and green sputum with chills weakness and body aches EXAM: CHEST - 2 VIEW COMPARISON:  December 17, 2020. FINDINGS: Left chest AICD with tip overlying the right ventricle. The heart size and mediastinal contours are within normal limits. Patchy bilateral airspace opacities. No visible pleural effusion or pneumothorax. The visualized skeletal structures are unremarkable. IMPRESSION: Patchy bilateral airspace opacities suspicious for multifocal pneumonia. Electronically Signed   By: Dahlia Bailiff M.D.   On: 04/09/2021 18:51    Procedures Procedures   Medications Ordered in ED Medications  cefTRIAXone (ROCEPHIN) 1 g in sodium chloride 0.9 % 100 mL IVPB (has no administration in time range)  azithromycin (ZITHROMAX) 500 mg in sodium chloride 0.9 % 250 mL IVPB (has no administration in time range)    ED Course  I have reviewed the triage vital signs and the nursing notes.  Pertinent labs & imaging results that were available during my care of the patient were reviewed by me and considered in my medical decision making (see chart for details).    MDM Rules/Calculators/A&P  CRITICAL CARE Performed by: Fredia Sorrow Total critical care time: 40  minutes Critical care time was exclusive of separately billable procedures and treating other patients. Critical care was necessary to treat or prevent imminent or life-threatening deterioration. Critical care was time spent personally by me on the following activities: development of treatment plan with patient and/or surrogate as well as nursing, discussions with consultants, evaluation of patient's response to treatment, examination of patient, obtaining history from patient or surrogate, ordering and performing treatments and interventions, ordering and review of laboratory studies, ordering and review of radiographic studies, pulse oximetry and re-evaluation of patient's condition.  Chest x-ray consistent with multifocal pneumonia.  COVID testing flu testing negative.  Patient does have a leukocytosis with a white blood cell count of 13.7.  Patient's electrolytes significant for sodium 130 potassium 5.3.  CO2 normal at 26 glucose 531.  Patient's renal function GFR greater than 60.  Patient frequently has elevated blood sugars.  We will give some fluids will give some insulin.  Patient overall clinically looks pretty stable.  We will follow his vital signs.  Will reassess after the fluids and insulin.  Patient is going to be treated with Rocephin and Zithromax.  For the multifocal pneumonia.  Also we will go ahead and give some IV insulin.  And some gentle fluids.  Will have patient continue on azithromycin.  Blood sugar showing some improvement for 72 after the IV insulin.  Patient still clinically very stable.  Patient's potassium was elevated little bit at 5.3.  Should be improved from the insulin.  Patient's renal function is normal.  Not worried about any significant hyperkalemia.  Patient without any acidosis.   Final Clinical Impression(s) / ED Diagnoses Final diagnoses:  Community acquired pneumonia, unspecified laterality  Hyperglycemia    Rx / DC Orders ED Discharge Orders      None        Fredia Sorrow, MD 04/09/21 2158    Fredia Sorrow, MD 04/09/21 [phone]

## 2021-04-09 NOTE — ED Notes (Signed)
77 Spo2  was not a correct reading. Rechecked SpO2 and it was 95

## 2021-04-09 NOTE — ED Triage Notes (Signed)
Patient c/o SOB and a productive cough with green sputum. Patient c/o chills, weakness, body aches and emesis yesterday.

## 2021-04-09 NOTE — Discharge Instructions (Addendum)
Follow back up with your primary care doctor for blood sugar check and blood pressure check.  Take the antibiotic as directed.  Would expect improvement over the next 24 hours for the pneumonia.  Return for any new or worse symptoms.

## 2021-04-10 ENCOUNTER — Other Ambulatory Visit: Payer: Self-pay

## 2021-04-17 ENCOUNTER — Ambulatory Visit (INDEPENDENT_AMBULATORY_CARE_PROVIDER_SITE_OTHER): Payer: Self-pay

## 2021-04-17 ENCOUNTER — Other Ambulatory Visit: Payer: Self-pay

## 2021-04-17 DIAGNOSIS — I420 Dilated cardiomyopathy: Secondary | ICD-10-CM

## 2021-04-17 NOTE — Progress Notes (Signed)
ICD check in clinic. Patient came to office unannounced c/o device alarming in the middle of the night for several nights. Paceart reviewed. RV lead impedances below threshold 04/14/21. Interrogation completed. Normal device function. Thresholds and sensing consistent with previous device measurements. Impedance trends stable over time. RV tip to coil 190, <200 ohms which triggered alert. Reviewed with Dr. Curt Bears and industry. Unable to reprogram impedance lower limit. Decision made to turn RV impedance alert to monitor only No ventricular arrhythmias. Histogram distribution appropriate for patient and level of activity. Device programmed at appropriate safety margins. Device programmed to optimize intrinsic conduction. Estimated longevity 11 years, 3 months. Pt enrolled in remote follow-up. Patient education completed including shock plan. Auditory/vibratory alert demonstrated. Optivol elevated. Patient denies s/s of volume overload. CHF action plan and low NA diet reviewed.

## 2021-04-21 ENCOUNTER — Encounter: Payer: Self-pay | Admitting: Critical Care Medicine
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
## 2021-04-23 ENCOUNTER — Encounter: Payer: Self-pay | Admitting: Critical Care Medicine

## 2021-04-23 ENCOUNTER — Ambulatory Visit: Payer: Self-pay | Attending: Critical Care Medicine | Admitting: Critical Care Medicine

## 2021-04-23 ENCOUNTER — Ambulatory Visit
Admission: RE | Admit: 2021-04-23 | Discharge: 2021-04-23 | Disposition: A | Discharge status: Home / Self Care | Payer: Self-pay | Source: Ambulatory Visit | Attending: Critical Care Medicine | Admitting: Critical Care Medicine

## 2021-04-23 ENCOUNTER — Other Ambulatory Visit: Payer: Self-pay

## 2021-04-23 ENCOUNTER — Telehealth: Payer: Self-pay | Admitting: Critical Care Medicine

## 2021-04-23 VITALS — BP 129/70 | HR 83 | Ht 72.0 in | Wt 146.0 lb

## 2021-04-23 DIAGNOSIS — Z1211 Encounter for screening for malignant neoplasm of colon: Secondary | ICD-10-CM

## 2021-04-23 DIAGNOSIS — J189 Pneumonia, unspecified organism: Secondary | ICD-10-CM

## 2021-04-23 DIAGNOSIS — F332 Major depressive disorder, recurrent severe without psychotic features: Secondary | ICD-10-CM

## 2021-04-23 DIAGNOSIS — E7800 Pure hypercholesterolemia, unspecified: Secondary | ICD-10-CM

## 2021-04-23 DIAGNOSIS — I428 Other cardiomyopathies: Secondary | ICD-10-CM

## 2021-04-23 DIAGNOSIS — Z87891 Personal history of nicotine dependence: Secondary | ICD-10-CM

## 2021-04-23 DIAGNOSIS — I5042 Chronic combined systolic (congestive) and diastolic (congestive) heart failure: Secondary | ICD-10-CM

## 2021-04-23 DIAGNOSIS — F1411 Cocaine abuse, in remission: Secondary | ICD-10-CM

## 2021-04-23 DIAGNOSIS — E1165 Type 2 diabetes mellitus with hyperglycemia: Secondary | ICD-10-CM

## 2021-04-23 MED ORDER — GLUCOSE BLOOD VI STRP
ORAL_STRIP | 12 refills | Status: DC
  Filled 2021-04-23: qty 100, 50d supply, fill #0
  Filled 2021-07-23: qty 100, 50d supply, fill #1

## 2021-04-23 MED ORDER — TRUEPLUS LANCETS 28G MISC
1 refills | Status: DC
  Filled 2021-04-23: qty 100, 50d supply, fill #0

## 2021-04-23 MED ORDER — FUROSEMIDE 20 MG PO TABS: 40.0000 mg | ORAL_TABLET | Freq: Every day | ORAL | 3 refills | Status: DC

## 2021-04-23 MED ORDER — DAPAGLIFLOZIN PROPANEDIOL 10 MG PO TABS
10.0000 mg | ORAL_TABLET | Freq: Every day | ORAL | 2 refills | Status: DC
  Filled 2021-04-23: qty 30, 30d supply, fill #0

## 2021-04-23 MED ORDER — LANTUS SOLOSTAR 100 UNIT/ML ~~LOC~~ SOPN
14.0000 [IU] | PEN_INJECTOR | Freq: Every day | SUBCUTANEOUS | 2 refills | Status: DC
  Filled 2021-04-23: qty 3, 21d supply, fill #0
  Filled 2021-07-23: qty 3, 21d supply, fill #1
  Filled 2021-08-11: qty 3, 21d supply, fill #2

## 2021-04-23 MED ORDER — ATORVASTATIN CALCIUM 40 MG PO TABS
40.0000 mg | ORAL_TABLET | Freq: Every day | ORAL | 1 refills | Status: DC
  Filled 2021-04-23: qty 30, 30d supply, fill #0
  Filled 2021-07-23: qty 30, 30d supply, fill #1
  Filled 2021-08-11: qty 90, 90d supply, fill #2

## 2021-04-23 MED ORDER — METFORMIN HCL 1000 MG PO TABS
1000.0000 mg | ORAL_TABLET | Freq: Two times a day (BID) | ORAL | 2 refills | Status: DC
  Filled 2021-04-23: qty 60, 30d supply, fill #0
  Filled 2021-07-23: qty 60, 30d supply, fill #1
  Filled 2021-08-11: qty 180, 90d supply, fill #2

## 2021-04-23 MED ORDER — SPIRONOLACTONE 25 MG PO TABS
12.5000 mg | ORAL_TABLET | Freq: Every day | ORAL | 4 refills | Status: DC
  Filled 2021-04-23: qty 30, 60d supply, fill #0
  Filled 2021-07-23: qty 30, 60d supply, fill #1
  Filled 2021-08-11: qty 45, 90d supply, fill #2

## 2021-04-23 MED ORDER — SACUBITRIL-VALSARTAN 24-26 MG PO TABS
1.0000 | ORAL_TABLET | Freq: Two times a day (BID) | ORAL | 4 refills | Status: DC
  Filled 2021-04-23: qty 60, 30d supply, fill #0

## 2021-04-23 MED ORDER — CARVEDILOL 25 MG PO TABS
25.0000 mg | ORAL_TABLET | Freq: Two times a day (BID) | ORAL | 6 refills | Status: DC
  Filled 2021-04-23 – 2021-07-23 (×2): qty 60, 30d supply, fill #0
  Filled 2021-08-11: qty 180, 90d supply, fill #1

## 2021-04-23 NOTE — Assessment & Plan Note (Signed)
Patient has expressed new commitment to sobriety. Referral to clinical social worker [place] for further support.

## 2021-04-23 NOTE — Assessment & Plan Note (Signed)
Chest x-ray ordered to reassess.

## 2021-04-23 NOTE — Assessment & Plan Note (Addendum)
Restart furosemide to improve edema in legs and ankles. Medication sent to pharmacy.  Ambulatory referral to CHF clinic, murmur heard today on auscultation. Continue on all other heart medications. Refills for these medications sent to pharmacy

## 2021-04-23 NOTE — Assessment & Plan Note (Signed)
Lipid panel ordered to assess. Atorvastatin refill sent to pharmacy.

## 2021-04-23 NOTE — Progress Notes (Signed)
Established Patient Office Visit  Subjective:  Patient ID: James [name]., male    DOB: 07-01-1960  Age: 61 y.o. MRN: [id number]  CC:  Chief Complaint  Patient presents with   Leg Swelling    HPI James [name]. presents for complaint of ankle swelling. He states that he first noticed it on 04/21/21. He states that it has been painful. He states he has no other symptoms.  In December 2022, he was seen in the ED for pneumonia of both lungs, and treated with antibiotics. He states that he has been feeling much better since then, and that he is not coughing or experiencing any problems with his breathing right now.  The patient also presented to the ED after a drug overdose. He was treated with Narcan and was discharged home with stable vitals and labs. He states that this was disappointing to him, as he had not been using any illicit substances for 18 months prior to this incident. He states that he has not used any substances since December 24, and he does not want to use anymore. He reports that his mood is good at this time.  This patient has a history of Type II diabetes. He states that he measures his sugars once a day, in the morning, and states that his values have been high recently. Per the patient, they have been in 360-380 range. He thinks this may be due to inconsistent medication adherence in December when he was using drugs. Since December 24, he states he has been taking all of his diabetes medications as prescribed. He states that this morning's value was 96.  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 He is followed by cardiology (Dr. 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 He states that he is taking  his prescribed medications.   Past Medical History:  Diagnosis Date   Cellulitis and abscess of foot 33/35/4562   Chronic systolic heart failure (HCC)    Cocaine abuse (HCC)     Cocaine dependence with cocaine-induced mood disorder ([place])    Depression    Diabetes mellitus without complication ([place])    Essential hypertension    Multiple closed fractures of ribs of both sides    NICM (nonischemic cardiomyopathy) ([place]) EF 20-25%    Suicidal ideations 07/12/2017    Past Surgical History:  Procedure Laterality Date   CARDIAC CATHETERIZATION N/A 04/30/2016   Procedure: Right/Left Heart Cath and Coronary Angiography;  Surgeon: Nelva Bush, MD;  Location: [facility];  Service: Cardiovascular;  Laterality: N/A;   FINGER SURGERY     ICD IMPLANT N/A 12/16/2020   Procedure: ICD IMPLANT;  Surgeon: Vickie Epley, MD;  Location: [facility];  Service: Cardiovascular;  Laterality: N/A;   LEFT HEART CATH AND CORONARY ANGIOGRAPHY N/A 12/12/2020   Procedure: LEFT HEART CATH AND CORONARY ANGIOGRAPHY;  Surgeon: Troy Sine, MD;  Location: [facility];  Service: Cardiovascular;  Laterality: N/A;    Family History  Problem Relation Age of Onset   Diabetes Other    Hypertension Other    Diabetes Mother    Brain cancer Father    Mental illness Brother    Drug abuse Brother    Diabetes Brother     Social History   Socioeconomic History   Marital status: Married    Spouse name: Not on file   Number of children: Not on file  Years of education: Not on file   Highest education level: Not on file  Occupational History   Not on file  Tobacco Use   Smoking status: Every Day    Packs/day: 0.50    Years: 23.00    Pack years: 11.50    Types: Cigarettes   Smokeless tobacco: Never   Tobacco comments:    Every other day   Vaping Use   Vaping Use: Never used  Substance and Sexual Activity   Alcohol use: Yes   Drug use: Not Currently    Types: Cocaine, "Crack" cocaine   Sexual activity: Not Currently  Other Topics Concern   Not on file  Social History Narrative   Not on file   Social Determinants of Health   Financial Resource Strain: Not  on file  Food Insecurity: Not on file  Transportation Needs: Not on file  Physical Activity: Not on file  Stress: Not on file  Social Connections: Not on file  Intimate Partner Violence: Not on file    Outpatient Medications Prior to Visit  Medication Sig Dispense Refill   aspirin 81 MG chewable tablet Chew 81 mg by mouth daily. 1Daily     blood glucose meter kit and supplies Dispense based on patient and insurance preference. Use up to four times daily as directed. (FOR ICD-9 250.00, 250.01). 1 each 0   Blood Glucose Monitoring Suppl (TRUE METRIX METER) w/Device KIT Use to measure blood sugar twice a day 1 kit 0   cholecalciferol (VITAMIN D3) 25 MCG (1000 UNIT) tablet Take 1,000 Units by mouth daily.     dextromethorphan-guaiFENesin (MUCINEX DM) 30-600 MG 12hr tablet Take 1 tablet by mouth 2 (two) times daily. 14 tablet 1   atorvastatin (LIPITOR) 40 MG tablet Take 1 tablet (40 mg total) by mouth daily. 90 tablet 1   carvedilol (COREG) 25 MG tablet Take 1 tablet (25 mg total) by mouth 2 (two) times daily with a meal. 60 tablet 6   dapagliflozin propanediol (FARXIGA) 10 MG TABS tablet Take 1 tablet (10 mg total) by mouth daily before breakfast. 30 tablet 2   furosemide (LASIX) 20 MG tablet HOLD until seen by cardiology 90 tablet 3   glucose blood test strip USE TO CHECK BLOOD SUGARS 2 TIMES DAILY 100 strip 12   insulin glargine (LANTUS SOLOSTAR) 100 UNIT/ML Solostar Pen Inject 14 Units into the skin at bedtime. 15 mL 2   Insulin Syringe 27G X 1/2" 0.5 ML MISC Use with lantus 100 each 1   sacubitril-valsartan (ENTRESTO) 24-26 MG Take 1 tablet by mouth 2 (two) times daily. 60 tablet 4   spironolactone (ALDACTONE) 25 MG tablet Take 0.5 tablets (12.5 mg total) by mouth daily. 30 tablet 4   TRUEplus Lancets 28G MISC Use to measure blood sugar twice a day 100 each 1   azithromycin (ZITHROMAX) 250 MG tablet Take 1 tablet (250 mg total) by mouth daily. Take first 2 tablets together, then 1 every day  until finished. (Patient not taking: Reported on 04/23/2021) 6 tablet 0   metFORMIN (GLUCOPHAGE) 1000 MG tablet Take 1 tablet (1,000 mg total) by mouth 2 (two) times daily with a meal. 120 tablet 2   No facility-administered medications prior to visit.    No Known Allergies  ROS Review of Systems  Constitutional: Negative.   HENT: Negative.    Eyes: Negative.   Respiratory:  Negative for cough and shortness of breath.   Cardiovascular:  Positive for leg swelling. Negative for chest  pain.  Gastrointestinal: Negative.   Endocrine: Negative.   Genitourinary: Negative.   Skin: Negative.   Allergic/Immunologic: Negative.   Neurological: Negative.   Psychiatric/Behavioral:  Negative for dysphoric mood and suicidal ideas.      Objective:    Physical Exam Constitutional:      General: He is not in acute distress.    Appearance: Normal appearance.  HENT:     Head: Normocephalic and atraumatic.  Cardiovascular:     Rate and Rhythm: Normal rate and regular rhythm.     Pulses: Normal pulses.     Heart sounds: Murmur heard.  Systolic (holosystolic, best heard at left lower sternal border) murmur is present.    No friction rub. No gallop.  Pulmonary:     Breath sounds: No wheezing, rhonchi or rales.  Musculoskeletal:     Right lower leg: Edema present.     Left lower leg: Edema present.  Skin:    General: Skin is warm and dry.  Neurological:     General: No focal deficit present.     Mental Status: He is alert and oriented to person, place, and time.  Psychiatric:        Mood and Affect: Mood normal.        Behavior: Behavior normal.    BP 129/70    Pulse 83    Ht 6' (1.829 m)    Wt 146 lb (66.2 kg)    SpO2 100%    BMI 19.80 kg/m  Wt Readings from Last 3 Encounters:  04/23/21 146 lb (66.2 kg)  04/09/21 156 lb 15.5 oz (71.2 kg)  04/06/21 156 lb 15.5 oz (71.2 kg)     Health Maintenance Due  Topic Date Due   OPHTHALMOLOGY EXAM  Never done   COLON CANCER SCREENING ANNUAL  FOBT  12/25/2020    There are no preventive care reminders to display for this patient.  Lab Results  Component Value Date   TSH 2.155 01/16/2017   Lab Results  Component Value Date   WBC 13.7 (H) 04/09/2021   HGB 13.1 04/09/2021   HCT 39.2 04/09/2021   MCV 85.6 04/09/2021   PLT 386 04/09/2021   Lab Results  Component Value Date   NA 130 (L) 04/09/2021   K 5.3 (H) 04/09/2021   CO2 26 04/09/2021   GLUCOSE 531 (HH) 04/09/2021   BUN 30 (H) 04/09/2021   CREATININE 0.93 04/09/2021   BILITOT 1.0 04/09/2021   ALKPHOS 94 04/09/2021   AST 11 (L) 04/09/2021   ALT 13 04/09/2021   PROT 6.9 04/09/2021   ALBUMIN 2.8 (L) 04/09/2021   CALCIUM 8.9 04/09/2021   ANIONGAP 13 04/09/2021   EGFR 84 12/22/2020   Lab Results  Component Value Date   CHOL 112 12/12/2020   Lab Results  Component Value Date   HDL 75 12/12/2020   Lab Results  Component Value Date   LDLCALC 30 12/12/2020   Lab Results  Component Value Date   TRIG 33 12/12/2020   Lab Results  Component Value Date   CHOLHDL 1.5 12/12/2020   Lab Results  Component Value Date   HGBA1C 10.7 (H) 12/11/2020      Assessment & Plan:   Problem List Items Addressed This Visit       Cardiovascular and Mediastinum   Chronic combined systolic and diastolic heart failure (HCC)    Restart furosemide to improve edema in legs and ankles. Medication sent to pharmacy.  Ambulatory referral to CHF clinic, murmur heard  today on auscultation. Continue on all other heart medications. Refills for these medications sent to pharmacy      Relevant Medications   atorvastatin (LIPITOR) 40 MG tablet   carvedilol (COREG) 25 MG tablet   furosemide (LASIX) 20 MG tablet   sacubitril-valsartan (ENTRESTO) 24-26 MG   spironolactone (ALDACTONE) 25 MG tablet   Other Relevant Orders   Comprehensive metabolic panel   CBC with Differential/Platelet     Respiratory   Pneumonia of both lungs due to infectious organism - Primary    Chest  x-ray ordered to reassess.      Relevant Orders   DG Chest 2 View     Endocrine   Uncontrolled type 2 diabetes mellitus with hyperglycemia ([place])    Continue metformin and farxiga for diabetes management. Continue home blood glucose monitoring. Medication refills sent to pharmacy.       Relevant Medications   atorvastatin (LIPITOR) 40 MG tablet   dapagliflozin propanediol (FARXIGA) 10 MG TABS tablet   insulin glargine (LANTUS SOLOSTAR) 100 UNIT/ML Solostar Pen   metFORMIN (GLUCOPHAGE) 1000 MG tablet   Other Relevant Orders   Comprehensive metabolic panel   Hemoglobin A1c     Other   HYPERCHOLESTEROLEMIA    Lipid panel ordered to assess. Atorvastatin refill sent to pharmacy.       Relevant Medications   atorvastatin (LIPITOR) 40 MG tablet   carvedilol (COREG) 25 MG tablet   furosemide (LASIX) 20 MG tablet   sacubitril-valsartan (ENTRESTO) 24-26 MG   spironolactone (ALDACTONE) 25 MG tablet   Other Relevant Orders   Lipid panel   History of cocaine abuse [facility])    Patient has expressed new commitment to sobriety. Referral to clinical social worker [facility] for further support.      Severe episode of recurrent major depressive disorder, without psychotic features ([place])    Patient states mood is stable at this time. Referral to [place], clinical social work for more support in this area.      History of tobacco use       Current smoking consumption amount: One half pack a week  Dicsussion on advise to quit smoking and smoking impacts: Cardiovascular impacts  Patient's willingness to quit: Not ready to quit  Methods to quit smoking discussed: Not discussed  Medication management of smoking session drugs discussed: Not discussed  Resources provided:  AVS   Setting quit date not yet obtained  Follow-up arranged short-term follow-up   Time spent counseling the patient: 5 minutes       Other Visit Diagnoses     Nonischemic cardiomyopathy ([place])       Relevant  Medications   atorvastatin (LIPITOR) 40 MG tablet   carvedilol (COREG) 25 MG tablet   furosemide (LASIX) 20 MG tablet   sacubitril-valsartan (ENTRESTO) 24-26 MG   spironolactone (ALDACTONE) 25 MG tablet   Other Relevant Orders   AMB referral to CHF clinic   Colon cancer screening       Relevant Orders   Fecal occult blood, imunochemical       Meds ordered this encounter  Medications   atorvastatin (LIPITOR) 40 MG tablet    Sig: Take 1 tablet (40 mg total) by mouth daily.    Dispense:  90 tablet    Refill:  1   carvedilol (COREG) 25 MG tablet    Sig: Take 1 tablet (25 mg total) by mouth 2 (two) times daily with a meal.    Dispense:  60 tablet    Refill:  6    For future refills   dapagliflozin propanediol (FARXIGA) 10 MG TABS tablet    Sig: Take 1 tablet (10 mg total) by mouth daily before breakfast.    Dispense:  30 tablet    Refill:  2   furosemide (LASIX) 20 MG tablet    Sig: Take 2 tablets (40 mg total) by mouth daily.    Dispense:  90 tablet    Refill:  3   glucose blood test strip    Sig: USE TO CHECK BLOOD SUGARS 2 TIMES DAILY    Dispense:  100 strip    Refill:  12   insulin glargine (LANTUS SOLOSTAR) 100 UNIT/ML Solostar Pen    Sig: Inject 14 Units into the skin at bedtime.    Dispense:  15 mL    Refill:  2   metFORMIN (GLUCOPHAGE) 1000 MG tablet    Sig: Take 1 tablet (1,000 mg total) by mouth 2 (two) times daily with a meal.    Dispense:  120 tablet    Refill:  2   sacubitril-valsartan (ENTRESTO) 24-26 MG    Sig: Take 1 tablet by mouth 2 (two) times daily.    Dispense:  60 tablet    Refill:  4   spironolactone (ALDACTONE) 25 MG tablet    Sig: Take 0.5 tablets (12.5 mg total) by mouth daily.    Dispense:  30 tablet    Refill:  4   TRUEplus Lancets 28G MISC    Sig: Use to measure blood sugar twice a day    Dispense:  100 each    Refill:  1   38 minutes spent reviewing records taking history and physical examining patient complex medical decision making  due to complex issues including behavioral health substance use heart failure pneumonia hypertension and patient education   Follow-up: Return in about 6 weeks (around 06/04/2021).    Asencion Noble, MD

## 2021-04-23 NOTE — Patient Instructions (Signed)
Chest x-ray will be obtained at the [facility]  All of your medications have been refilled sent to our new pharmacy at the [facility] first floor will be ready after lunch today  Resume your furosemide daily for your swelling  Complete set of labs were ordered today  [facility] our clinical social worker will contact you for your mental health  Return to see Dr. Delford Field 6 weeks

## 2021-04-23 NOTE — Assessment & Plan Note (Signed)
° ° °•   Current smoking consumption amount: One half pack a week  Dicsussion on advise to quit smoking and smoking impacts: Cardiovascular impacts  Patient's willingness to quit: Not ready to quit  Methods to quit smoking discussed: Not discussed  Medication management of smoking session drugs discussed: Not discussed  Resources provided:  AVS   Setting quit date not yet obtained  Follow-up arranged short-term follow-up   Time spent counseling the patient: 5 minutes  

## 2021-04-23 NOTE — Assessment & Plan Note (Signed)
Patient states mood is stable at this time. Referral to [place], clinical social work for more support in this area.

## 2021-04-23 NOTE — Assessment & Plan Note (Signed)
Continue metformin and farxiga for diabetes management. Continue home blood glucose monitoring. Medication refills sent to pharmacy.

## 2021-04-23 NOTE — Progress Notes (Signed)
Swelling in legs. 

## 2021-04-23 NOTE — Telephone Encounter (Signed)
Pls see this patient,  needs referral to treatment for cocaine use and alcohol and see patient for mental health/anxiety

## 2021-04-24 ENCOUNTER — Encounter: Payer: Self-pay | Admitting: Critical Care Medicine

## 2021-04-24 ENCOUNTER — Other Ambulatory Visit: Payer: Self-pay | Admitting: Critical Care Medicine

## 2021-04-24 ENCOUNTER — Other Ambulatory Visit: Payer: Self-pay

## 2021-04-24 DIAGNOSIS — I5042 Chronic combined systolic (congestive) and diastolic (congestive) heart failure: Secondary | ICD-10-CM

## 2021-04-24 LAB — CBC WITH DIFFERENTIAL/PLATELET
Basophils Absolute: 0 10*3/uL (ref 0.0–0.2)
Basos: 1 %
EOS (ABSOLUTE): 0.1 10*3/uL (ref 0.0–0.4)
Eos: 3 %
Hematocrit: 35.5 % — ABNORMAL LOW (ref 37.5–51.0)
Hemoglobin: 11.5 g/dL — ABNORMAL LOW (ref 13.0–17.7)
Immature Grans (Abs): 0 10*3/uL (ref 0.0–0.1)
Immature Granulocytes: 0 %
Lymphocytes Absolute: 1.4 10*3/uL (ref 0.7–3.1)
Lymphs: 39 %
MCH: 28.2 pg (ref 26.6–33.0)
MCHC: 32.4 g/dL (ref 31.5–35.7)
MCV: 87 fL (ref 79–97)
Monocytes Absolute: 0.3 10*3/uL (ref 0.1–0.9)
Monocytes: 8 %
Neutrophils Absolute: 1.8 10*3/uL (ref 1.4–7.0)
Neutrophils: 49 %
Platelets: 378 10*3/uL (ref 150–450)
RBC: 4.08 x10E6/uL — ABNORMAL LOW (ref 4.14–5.80)
RDW: 14.5 % (ref 11.6–15.4)
WBC: 3.6 10*3/uL (ref 3.4–10.8)

## 2021-04-24 LAB — COMPREHENSIVE METABOLIC PANEL
ALT: 20 IU/L (ref 0–44)
AST: 22 IU/L (ref 0–40)
Albumin/Globulin Ratio: 1.3 (ref 1.2–2.2)
Albumin: 3.3 g/dL — ABNORMAL LOW (ref 3.8–4.8)
Alkaline Phosphatase: 84 IU/L (ref 44–121)
BUN/Creatinine Ratio: 14 (ref 10–24)
BUN: 11 mg/dL (ref 8–27)
Bilirubin Total: <0.2 mg/dL (ref 0.0–1.2)
CO2: 26 mmol/L (ref 20–29)
Calcium: 8.9 mg/dL (ref 8.6–10.2)
Chloride: 102 mmol/L (ref 96–106)
Creatinine, Ser: 0.77 mg/dL (ref 0.76–1.27)
Globulin, Total: 2.5 g/dL (ref 1.5–4.5)
Glucose: 179 mg/dL — ABNORMAL HIGH (ref 70–99)
Potassium: 4.7 mmol/L (ref 3.5–5.2)
Sodium: 139 mmol/L (ref 134–144)
Total Protein: 5.8 g/dL — ABNORMAL LOW (ref 6.0–8.5)
eGFR: 101 mL/min/{1.73_m2} (ref >59)

## 2021-04-24 LAB — LIPID PANEL
Chol/HDL Ratio: 1.8 ratio (ref 0.0–5.0)
Cholesterol, Total: 163 mg/dL (ref 100–199)
HDL: 93 mg/dL (ref >39)
LDL Chol Calc (NIH): 59 mg/dL (ref 0–99)
Triglycerides: 51 mg/dL (ref 0–149)
VLDL Cholesterol Cal: 11 mg/dL (ref 5–40)

## 2021-04-24 LAB — HEMOGLOBIN A1C
Est. average glucose Bld gHb Est-mCnc: 384 mg/dL
Hgb A1c MFr Bld: 15 % — ABNORMAL HIGH (ref 4.8–5.6)

## 2021-04-24 MED ORDER — TRUE METRIX METER W/DEVICE KIT
PACK | 0 refills | Status: AC
  Filled 2021-04-24: qty 1, 30d supply, fill #0

## 2021-04-24 MED ORDER — FUROSEMIDE 20 MG PO TABS
40.0000 mg | ORAL_TABLET | Freq: Every day | ORAL | 3 refills | Status: DC
  Filled 2021-04-24: qty 90, 45d supply, fill #0
  Filled 2021-08-11: qty 180, 90d supply, fill #1

## 2021-04-24 NOTE — Telephone Encounter (Signed)
Patient requesting insulin pen needles, not on current medication list.

## 2021-04-24 NOTE — Telephone Encounter (Signed)
Pts Rx refill for furosemide (LASIX) 20 MG tablet was not received by the pharmacy it was sent yesterday but the class says Print / please resend to community pharmacy   Pt also needs a new Truemetrix meter / his is acting up / pt states he also needs pen needles as well / please advise

## 2021-04-24 NOTE — Telephone Encounter (Signed)
Requested Prescriptions  Pending Prescriptions Disp Refills   furosemide (LASIX) 20 MG tablet 90 tablet 3    Sig: Take 2 tablets (40 mg total) by mouth daily.     Cardiovascular:  Diuretics - Loop Passed - 04/24/2021 10:43 AM      Passed - K in normal range and within 360 days    Potassium  Date Value Ref Range Status  04/23/2021 4.7 3.5 - 5.2 mmol/L Final         Passed - Ca in normal range and within 360 days    Calcium  Date Value Ref Range Status  04/23/2021 8.9 8.6 - 10.2 mg/dL Final   Calcium, Ion  Date Value Ref Range Status  12/11/2020 1.19 1.15 - 1.40 mmol/L Final         Passed - Na in normal range and within 360 days    Sodium  Date Value Ref Range Status  04/23/2021 139 134 - 144 mmol/L Final         Passed - Cr in normal range and within 360 days    Creat  Date Value Ref Range Status  06/15/2016 0.97 0.70 - 1.33 mg/dL Final    Comment:      For patients > or = 63 years of age: The upper reference limit for Creatinine is approximately 13% higher for people identified as African-American.      Creatinine, Ser  Date Value Ref Range Status  04/23/2021 0.77 0.76 - 1.27 mg/dL Final         Passed - Last BP in normal range    BP Readings from Last 1 Encounters:  04/23/21 129/70         Passed - Valid encounter within last 6 months    Recent Outpatient Visits          Yesterday Pneumonia of both lungs due to infectious organism, unspecified part of lung   [place] Elsie Stain, MD   4 months ago Dilated cardiomyopathy [facility])   [place] Elsie Stain, MD   5 months ago Uncontrolled type 2 diabetes mellitus with hyperglycemia [facility])   [place] Elsie Stain, MD   8 months ago Uncontrolled type 2 diabetes mellitus with hyperglycemia [facility])   [place], Annie Main L, RPH-CPP   8 months ago Uncontrolled type  2 diabetes mellitus with hyperglycemia [facility])   [place] Elsie Stain, MD      Future Appointments            In 1 month Joya Gaskins Burnett Harry, MD [place]            Blood Glucose Monitoring Suppl (TRUE METRIX METER) w/Device KIT 1 kit 0    Sig: Use to measure blood sugar twice a day     Endocrinology: Diabetes - Testing Supplies Passed - 04/24/2021 10:43 AM      Passed - Valid encounter within last 12 months    Recent Outpatient Visits          Yesterday Pneumonia of both lungs due to infectious organism, unspecified part of lung   [place], MD   4 months ago Dilated cardiomyopathy [facility])   [place], Patrick E, MD   5 months ago Uncontrolled type 2 diabetes mellitus  with hyperglycemia [facility])   [place] Elsie Stain, MD   8 months ago Uncontrolled type 2 diabetes mellitus with hyperglycemia [facility])   [place], Jarome Matin, RPH-CPP   8 months ago Uncontrolled type 2 diabetes mellitus with hyperglycemia [facility])   [place], MD      Future Appointments            In 1 month Joya Gaskins Burnett Harry, MD [place]

## 2021-05-05 ENCOUNTER — Other Ambulatory Visit: Payer: Self-pay

## 2021-06-01 NOTE — Telephone Encounter (Signed)
I attempted to call this pt again, no answer, left vm.

## 2021-06-04 ENCOUNTER — Ambulatory Visit: Payer: Self-pay | Admitting: Critical Care Medicine

## 2021-06-04 NOTE — Progress Notes (Unsigned)
Established Patient Office Visit  Subjective:  Patient ID: James Devine., male    DOB: 01/15/1961  Age: 61 y.o. MRN: [id number]  CC: No chief complaint on file.   HPI James Frei. presents for   04/23/21 James Comings. presents for complaint of ankle swelling. He states that he first noticed it on 04/21/21. He states that it has been painful. He states he has no other symptoms.   In December 2022, he was seen in the ED for pneumonia of both lungs, and treated with antibiotics. He states that he has been feeling much better since then, and that he is not coughing or experiencing any problems with his breathing right now.   The patient also presented to the ED after a drug overdose. He was treated with Narcan and was discharged home with stable vitals and labs. He states that this was disappointing to him, as he had not been using any illicit substances for 18 months prior to this incident. He states that he has not used any substances since December 24, and he does not want to use anymore. He reports that his mood is good at this time.   This patient has a history of Type II diabetes. He states that he measures his sugars once a day, in the morning, and states that his values have been high recently. Per the patient, they have been in 360-380 range. He thinks this may be due to inconsistent medication adherence in December when he was using drugs. Since December 24, he states he has been taking all of his diabetes medications as prescribed. He states that this morning's value was 96.   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 He is followed by cardiology (Dr. 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 He states that he is taking  his prescribed medications.   Chronic combined systolic and diastolic heart failure (HCC)       Restart furosemide to improve edema in legs and ankles. Medication  sent to pharmacy.   Ambulatory referral to CHF clinic, murmur heard today on auscultation. Continue on all other heart medications. Refills for these medications sent to pharmacy        Relevant Medications    atorvastatin (LIPITOR) 40 MG tablet    carvedilol (COREG) 25 MG tablet    furosemide (LASIX) 20 MG tablet    sacubitril-valsartan (ENTRESTO) 24-26 MG    spironolactone (ALDACTONE) 25 MG tablet    Other Relevant Orders    Comprehensive metabolic panel    CBC with Differential/Platelet        Respiratory    Pneumonia of both lungs due to infectious organism - Primary      Chest x-ray ordered to reassess.        Relevant Orders    DG Chest 2 View        Endocrine    Uncontrolled type 2 diabetes mellitus with hyperglycemia ([place])      Continue metformin and farxiga for diabetes management. Continue home blood glucose monitoring. Medication refills sent to pharmacy.         Relevant Medications    atorvastatin (LIPITOR) 40 MG tablet    dapagliflozin propanediol (FARXIGA) 10 MG TABS tablet    insulin glargine (LANTUS SOLOSTAR) 100 UNIT/ML Solostar Pen    metFORMIN (GLUCOPHAGE) 1000 MG tablet    Other Relevant Orders    Comprehensive metabolic panel    Hemoglobin  A1c        Other    HYPERCHOLESTEROLEMIA      Lipid panel ordered to assess. Atorvastatin refill sent to pharmacy.         Relevant Medications    atorvastatin (LIPITOR) 40 MG tablet    carvedilol (COREG) 25 MG tablet    furosemide (LASIX) 20 MG tablet    sacubitril-valsartan (ENTRESTO) 24-26 MG    spironolactone (ALDACTONE) 25 MG tablet    Other Relevant Orders    Lipid panel    History of cocaine abuse [name])      Patient has expressed new commitment to sobriety. Referral to clinical social worker [place] for further support.        Severe episode of recurrent major depressive disorder, without psychotic features ([place])      Patient states mood is stable at this time. Referral to [place], clinical social  work for more support in this area.        History of tobacco use            Past Medical History:  Diagnosis Date   Cellulitis and abscess of foot 29/47/6546   Chronic systolic heart failure (HCC)    Cocaine abuse (HCC)    Cocaine dependence with cocaine-induced mood disorder ([place])    Depression    Diabetes mellitus without complication ([place])    Essential hypertension    Multiple closed fractures of ribs of both sides    NICM (nonischemic cardiomyopathy) ([place]) EF 20-25%    Suicidal ideations 07/12/2017    Past Surgical History:  Procedure Laterality Date   CARDIAC CATHETERIZATION N/A 04/30/2016   Procedure: Right/Left Heart Cath and Coronary Angiography;  Surgeon: Nelva Bush, MD;  Location: [place] [facility];  Service: Cardiovascular;  Laterality: N/A;   FINGER SURGERY     ICD IMPLANT N/A 12/16/2020   Procedure: ICD IMPLANT;  Surgeon: Vickie Epley, MD;  Location: [place] [facility];  Service: Cardiovascular;  Laterality: N/A;   LEFT HEART CATH AND CORONARY ANGIOGRAPHY N/A 12/12/2020   Procedure: LEFT HEART CATH AND CORONARY ANGIOGRAPHY;  Surgeon: Troy Sine, MD;  Location: [place] [facility];  Service: Cardiovascular;  Laterality: N/A;    Family History  Problem Relation Age of Onset   Diabetes Other    Hypertension Other    Diabetes Mother    Brain cancer Father    Mental illness Brother    Drug abuse Brother    Diabetes Brother     Social History   Socioeconomic History   Marital status: Married    Spouse name: Not on file   Number of children: Not on file   Years of education: Not on file   Highest education level: Not on file  Occupational History   Not on file  Tobacco Use   Smoking status: Every Day    Packs/day: 0.50    Years: 23.00    Pack years: 11.50    Types: Cigarettes   Smokeless tobacco: Never   Tobacco comments:    Every other day   Vaping Use   Vaping Use: Never used  Substance and Sexual Activity   Alcohol use: Yes   Drug  use: Not Currently    Types: Cocaine, "Crack" cocaine   Sexual activity: Not Currently  Other Topics Concern   Not on file  Social History Narrative   Not on file   Social Determinants of Health   Financial Resource Strain: Not on file  Food Insecurity:  Not on file  Transportation Needs: Not on file  Physical Activity: Not on file  Stress: Not on file  Social Connections: Not on file  Intimate Partner Violence: Not on file    Outpatient Medications Prior to Visit  Medication Sig Dispense Refill   aspirin 81 MG chewable tablet Chew 81 mg by mouth daily. 1Daily     atorvastatin (LIPITOR) 40 MG tablet Take 1 tablet (40 mg total) by mouth daily. 90 tablet 1   blood glucose meter kit and supplies Dispense based on patient and insurance preference. Use up to four times daily as directed. (FOR ICD-9 250.00, 250.01). 1 each 0   Blood Glucose Monitoring Suppl (TRUE METRIX METER) w/Device KIT Use to measure blood sugar twice a day 1 kit 0   carvedilol (COREG) 25 MG tablet Take 1 tablet (25 mg total) by mouth 2 (two) times daily with a meal. 60 tablet 6   cholecalciferol (VITAMIN D3) 25 MCG (1000 UNIT) tablet Take 1,000 Units by mouth daily.     dapagliflozin propanediol (FARXIGA) 10 MG TABS tablet Take 1 tablet (10 mg total) by mouth daily before breakfast. 30 tablet 2   dextromethorphan-guaiFENesin (MUCINEX DM) 30-600 MG 12hr tablet Take 1 tablet by mouth 2 (two) times daily. 14 tablet 1   furosemide (LASIX) 20 MG tablet Take 2 tablets (40 mg total) by mouth daily. 90 tablet 3   glucose blood test strip USE TO CHECK BLOOD SUGARS 2 TIMES DAILY 100 strip 12   insulin glargine (LANTUS SOLOSTAR) 100 UNIT/ML Solostar Pen Inject 14 Units into the skin at bedtime. 15 mL 2   metFORMIN (GLUCOPHAGE) 1000 MG tablet Take 1 tablet (1,000 mg total) by mouth 2 (two) times daily with a meal. 120 tablet 2   sacubitril-valsartan (ENTRESTO) 24-26 MG Take 1 tablet by mouth 2 (two) times daily. 60 tablet 4    spironolactone (ALDACTONE) 25 MG tablet Take 0.5 tablets (12.5 mg total) by mouth daily. 30 tablet 4   TRUEplus Lancets 28G MISC Use to measure blood sugar twice a day 100 each 1   No facility-administered medications prior to visit.    No Known Allergies  ROS Review of Systems    Objective:    Physical Exam  There were no vitals taken for this visit. Wt Readings from Last 3 Encounters:  04/23/21 146 lb (66.2 kg)  04/09/21 156 lb 15.5 oz (71.2 kg)  04/06/21 156 lb 15.5 oz (71.2 kg)     Health Maintenance Due  Topic Date Due   OPHTHALMOLOGY EXAM  Never done   COLON CANCER SCREENING ANNUAL FOBT  12/25/2020    There are no preventive care reminders to display for this patient.  Lab Results  Component Value Date   TSH 2.155 01/16/2017   Lab Results  Component Value Date   WBC 3.6 04/23/2021   HGB 11.5 (L) 04/23/2021   HCT 35.5 (L) 04/23/2021   MCV 87 04/23/2021   PLT 378 04/23/2021   Lab Results  Component Value Date   NA 139 04/23/2021   K 4.7 04/23/2021   CO2 26 04/23/2021   GLUCOSE 179 (H) 04/23/2021   BUN 11 04/23/2021   CREATININE 0.77 04/23/2021   BILITOT <0.2 04/23/2021   ALKPHOS 84 04/23/2021   AST 22 04/23/2021   ALT 20 04/23/2021   PROT 5.8 (L) 04/23/2021   ALBUMIN 3.3 (L) 04/23/2021   CALCIUM 8.9 04/23/2021   ANIONGAP 13 04/09/2021   EGFR 101 04/23/2021   Lab Results  Component Value Date   CHOL 163 04/23/2021   Lab Results  Component Value Date   HDL 93 04/23/2021   Lab Results  Component Value Date   LDLCALC 59 04/23/2021   Lab Results  Component Value Date   TRIG 51 04/23/2021   Lab Results  Component Value Date   CHOLHDL 1.8 04/23/2021   Lab Results  Component Value Date   HGBA1C 15.0 (H) 04/23/2021      Assessment & Plan:   Problem List Items Addressed This Visit   None   No orders of the defined types were placed in this encounter.   Follow-up: No follow-ups on file.    Asencion Noble, MD

## 2021-06-12 NOTE — Telephone Encounter (Signed)
Final attempt to reach pt, no answer, left vm. °

## 2021-06-18 ENCOUNTER — Emergency Department (HOSPITAL_COMMUNITY)
Admission: EM | Admit: 2021-06-18 | Discharge: 2021-06-19 | Payer: Self-pay | Attending: Emergency Medicine | Admitting: Emergency Medicine

## 2021-06-18 ENCOUNTER — Ambulatory Visit: Payer: Self-pay

## 2021-06-18 ENCOUNTER — Other Ambulatory Visit: Payer: Self-pay

## 2021-06-18 DIAGNOSIS — Z794 Long term (current) use of insulin: Secondary | ICD-10-CM | POA: Insufficient documentation

## 2021-06-18 DIAGNOSIS — E162 Hypoglycemia, unspecified: Secondary | ICD-10-CM | POA: Insufficient documentation

## 2021-06-18 DIAGNOSIS — Z7982 Long term (current) use of aspirin: Secondary | ICD-10-CM | POA: Insufficient documentation

## 2021-06-18 LAB — CBG MONITORING, ED
Glucose-Capillary: 140 mg/dL — ABNORMAL HIGH (ref 70–99)
Glucose-Capillary: 263 mg/dL — ABNORMAL HIGH (ref 70–99)

## 2021-06-18 LAB — CBC WITH DIFFERENTIAL/PLATELET
Abs Immature Granulocytes: 0.01 10*3/uL (ref 0.00–0.07)
Basophils Absolute: 0 10*3/uL (ref 0.0–0.1)
Basophils Relative: 1 %
Eosinophils Absolute: 0.1 10*3/uL (ref 0.0–0.5)
Eosinophils Relative: 3 %
HCT: 38.5 % — ABNORMAL LOW (ref 39.0–52.0)
Hemoglobin: 12.6 g/dL — ABNORMAL LOW (ref 13.0–17.0)
Immature Granulocytes: 0 %
Lymphocytes Relative: 33 %
Lymphs Abs: 1 10*3/uL (ref 0.7–4.0)
MCH: 29.4 pg (ref 26.0–34.0)
MCHC: 32.7 g/dL (ref 30.0–36.0)
MCV: 89.7 fL (ref 80.0–100.0)
Monocytes Absolute: 0.2 10*3/uL (ref 0.1–1.0)
Monocytes Relative: 7 %
Neutro Abs: 1.7 10*3/uL (ref 1.7–7.7)
Neutrophils Relative %: 56 %
Platelets: 183 10*3/uL (ref 150–400)
RBC: 4.29 MIL/uL (ref 4.22–5.81)
RDW: 13.8 % (ref 11.5–15.5)
WBC: 3 10*3/uL — ABNORMAL LOW (ref 4.0–10.5)
nRBC: 0 % (ref 0.0–0.2)

## 2021-06-18 LAB — COMPREHENSIVE METABOLIC PANEL
ALT: 42 U/L (ref 0–44)
AST: 46 U/L — ABNORMAL HIGH (ref 15–41)
Albumin: 3 g/dL — ABNORMAL LOW (ref 3.5–5.0)
Alkaline Phosphatase: 48 U/L (ref 38–126)
Anion gap: 9 (ref 5–15)
BUN: 9 mg/dL (ref 8–23)
CO2: 30 mmol/L (ref 22–32)
Calcium: 8.3 mg/dL — ABNORMAL LOW (ref 8.9–10.3)
Chloride: 98 mmol/L (ref 98–111)
Creatinine, Ser: 0.74 mg/dL (ref 0.61–1.24)
GFR, Estimated: >60 mL/min (ref >60)
Glucose, Bld: 187 mg/dL — ABNORMAL HIGH (ref 70–99)
Potassium: 4.8 mmol/L (ref 3.5–5.1)
Sodium: 137 mmol/L (ref 135–145)
Total Bilirubin: 1 mg/dL (ref 0.3–1.2)
Total Protein: 5.3 g/dL — ABNORMAL LOW (ref 6.5–8.1)

## 2021-06-18 LAB — MAGNESIUM: Magnesium: 1.7 mg/dL (ref 1.7–2.4)

## 2021-06-18 NOTE — ED Triage Notes (Signed)
Pt bib [facility] from jail c/o hypoglycemia. Pt was lying in bed, became diaphoretic, and became unresponsive. Jail RN gave 1mg  of glucagon. EMS arrived, pt CBG was 27.EMS gave 25g of D10 and pt became responsive. CBG got up to 92. Pt was able to eat 2 trays of food. CBG remained between 60-75. Last CBG with EMS was 63. Oral glucose was given. Pt detoxing from cocaine. Pt A&Ox4 ? ?EMS vitals and meds ?142/64 BP ?72 HR ?18 RR ?95% O2 RA ?CBG 63 @1808  ?18g Rt AC ?25g D10 @ 1630 ?15g oral glucose @ 1815 ?1mg  glucagon given by jail RN ?

## 2021-06-18 NOTE — ED Provider Notes (Signed)
? ?[place]  ?Provider Note ? ?CSN: [id number] ?Arrival date & time: 06/18/21 1823 ? ?History ?Chief Complaint  ?Patient presents with  ? Hypoglycemia  ? ? ?Danne Vasek. is a 61 y.o. male who presents today with hypoglycemia. Patient is currently in jail. He has been having increasing hyperglycemic episodes in jail. They have been increasing his insulin dosing, specifically increasing his lantus and adding NPH which he does not normally use. Today he was unresponsive and found to be hypoglycemic with a glucose of 26. EMS was called and he received d10, food, oral glucose, and glucagon. He now feels back to normal. No recent illnesses.  ? ? ?Home Medications ?Prior to Admission medications   ?Medication Sig Start Date End Date Taking? Authorizing Provider  ?aspirin 81 MG chewable tablet Chew 81 mg by mouth daily. 1Daily    [provider]  ?atorvastatin (LIPITOR) 40 MG tablet Take 1 tablet (40 mg total) by mouth daily. 04/23/21   Elsie Stain, MD  ?blood glucose meter kit and supplies Dispense based on patient and insurance preference. Use up to four times daily as directed. (FOR ICD-9 250.00, 250.01). 07/17/19   Mayers, Loraine Grip, PA-C  ?Blood Glucose Monitoring Suppl (TRUE METRIX METER) w/Device KIT Use to measure blood sugar twice a day 04/24/21   Elsie Stain, MD  ?carvedilol (COREG) 25 MG tablet Take 1 tablet (25 mg total) by mouth 2 (two) times daily with a meal. 04/23/21   Elsie Stain, MD  ?cholecalciferol (VITAMIN D3) 25 MCG (1000 UNIT) tablet Take 1,000 Units by mouth daily.    [provider]  ?dapagliflozin propanediol (FARXIGA) 10 MG TABS tablet Take 1 tablet (10 mg total) by mouth daily before breakfast. 04/23/21   Elsie Stain, MD  ?dextromethorphan-guaiFENesin [facility] DM) 30-600 MG 12hr tablet Take 1 tablet by mouth 2 (two) times daily. 04/09/21   Fredia Sorrow, MD  ?furosemide (LASIX) 20 MG tablet Take 2 tablets (40 mg  total) by mouth daily. 04/24/21   Elsie Stain, MD  ?glucose blood test strip USE TO CHECK BLOOD SUGARS 2 TIMES DAILY 04/23/21   Elsie Stain, MD  ?insulin glargine (LANTUS SOLOSTAR) 100 UNIT/ML Solostar Pen Inject 14 Units into the skin at bedtime. 04/23/21   Elsie Stain, MD  ?metFORMIN (GLUCOPHAGE) 1000 MG tablet Take 1 tablet (1,000 mg total) by mouth 2 (two) times daily with a meal. 04/23/21 06/22/21  Elsie Stain, MD  ?sacubitril-valsartan (ENTRESTO) 24-26 MG Take 1 tablet by mouth 2 (two) times daily. 04/23/21   Elsie Stain, MD  ?spironolactone (ALDACTONE) 25 MG tablet Take 0.5 tablets (12.5 mg total) by mouth daily. 04/23/21   Elsie Stain, MD  ?TRUEplus Lancets 28G MISC Use to measure blood sugar twice a day 04/23/21   Elsie Stain, MD  ? ? ? ?Allergies    ?Patient has no known allergies. ? ? ?Review of Systems   ?Review of Systems  ?Constitutional:  Positive for fatigue. Negative for chills and fever.  ?HENT:  Negative for ear pain and sore throat.   ?Eyes:  Negative for pain and visual disturbance.  ?Respiratory:  Negative for cough and shortness of breath.   ?Cardiovascular:  Negative for chest pain and palpitations.  ?Gastrointestinal:  Negative for abdominal pain and vomiting.  ?Genitourinary:  Negative for dysuria and hematuria.  ?Musculoskeletal:  Negative for arthralgias and back pain.  ?Skin:  Negative for color change and rash.  ?  Neurological:  Negative for seizures and syncope.  ?All other systems reviewed and are negative. ?Please see HPI for pertinent positives and negatives ? ?Physical Exam ?BP 93/74   Pulse 88   Resp 20   SpO2 99%  ? ?Physical Exam ?Vitals and nursing note reviewed.  ?Constitutional:   ?   General: He is not in acute distress. ?   Appearance: He is well-developed.  ?HENT:  ?   Head: Normocephalic and atraumatic.  ?Eyes:  ?   Conjunctiva/sclera: Conjunctivae normal.  ?Cardiovascular:  ?   Rate and Rhythm: Normal rate and regular rhythm.  ?    Heart sounds: No murmur heard. ?Pulmonary:  ?   Effort: Pulmonary effort is normal. No respiratory distress.  ?   Breath sounds: Normal breath sounds.  ?Abdominal:  ?   Palpations: Abdomen is soft.  ?   Tenderness: There is no abdominal tenderness.  ?Musculoskeletal:     ?   General: No swelling.  ?   Cervical back: Neck supple.  ?   Right lower leg: No edema.  ?   Left lower leg: No edema.  ?Skin: ?   General: Skin is warm and dry.  ?   Capillary Refill: Capillary refill takes less than 2 seconds.  ?Neurological:  ?   Mental Status: He is alert.  ?Psychiatric:     ?   Mood and Affect: Mood normal.  ? ? ?ED Results / Procedures / Treatments   ?EKG ?EKG Interpretation ? ?Date/Time:  Thursday June 18 2021 18:45:55 EST ?Ventricular Rate:  78 ?PR Interval:  197 ?QRS Duration: 98 ?QT Interval:  429 ?QTC Calculation: 489 ?R Axis:   83 ?Text Interpretation: Sinus rhythm Consider left atrial enlargement Borderline right axis deviation Borderline prolonged QT interval No significant change since last tracing Confirmed by Gareth Morgan [phone]) on 06/18/2021 8:26:59 PM ? ?Procedures ?Procedures ? ?Medications Ordered in the ED ?Medications - No data to display ? ? ?ED Course  ? ?  ? ? ?MDM  ? ?This patient presents to the ED for concern of hypoglycemia, this involves an extensive number of treatment options, and is a complaint that carries with it a high risk of complications and morbidity.  The differential diagnosis includes medication issue, illness, metabolic abnormality.  ? ?Additional history obtained: ?Additional history obtained from EMS  and correctional officer ?Records reviewed previous admission documents, Care Everywhere/External Records, and Primary Care Documents ? ?Lab Tests: ?I Ordered, and personally interpreted labs.  The pertinent results include:  no hypoglycemia. No electrolyte abnormalities. ? ?EKG (personally reviewed and interpreted): no stemi, no acute abnormalities. ? ?Medical Decision Making:  Patient presented with hypoglycemia. He is currently incarcerated and has been detoxing from crack cocaine. Multiple episodes of hypoglycemia today. Blood sugar improved on arrival and patient feeling back to normal. His workup was unremarkable here and the cause of his hypoglycemia is likely related to his medication changes at the jail. There were no metabolic abnormalities identified other than the changes in glucose. He was observed for 4 hours with no return of hypoglycemia. He was tolerating po intake. We will plan for discharge back to jail.  ? ?Complexity of problems addressed: ?Patient?s presentation is most consistent with  acute presentation with potential threat to life or bodily function ? ?Disposition: ?After consideration of the diagnostic results and the patient?s response to treatment,  ?I feel that the patent would benefit from discharge back to jail .  ? ?Patient seen in conjunction with my attending,  Dr. Billy Fischer. ? ? ? ?Final Clinical Impression(s) / ED Diagnoses ?Final diagnoses:  ?Hypoglycemia  ? ? ?Rx / DC Orders ?ED Discharge Orders   ? ? None  ? ?  ? ? ?  ?Jacelyn Pi, MD ?06/19/21 1511 ? ?  ?Gareth Morgan, MD ?06/20/21 1751 ? ?

## 2021-06-18 NOTE — Discharge Instructions (Signed)
Patient is okay to have a night snack to avoid hypoglycemic episodes. ?

## 2021-07-07 ENCOUNTER — Encounter: Payer: Self-pay | Admitting: Critical Care Medicine

## 2021-07-07 ENCOUNTER — Encounter: Payer: Self-pay | Admitting: Cardiology

## 2021-07-16 ENCOUNTER — Other Ambulatory Visit: Payer: Self-pay

## 2021-07-23 ENCOUNTER — Other Ambulatory Visit: Payer: Self-pay

## 2021-07-27 ENCOUNTER — Other Ambulatory Visit: Payer: Self-pay

## 2021-08-11 ENCOUNTER — Other Ambulatory Visit: Payer: Self-pay | Admitting: Pharmacist

## 2021-08-11 ENCOUNTER — Other Ambulatory Visit: Payer: Self-pay

## 2021-08-11 MED ORDER — BASAGLAR KWIKPEN 100 UNIT/ML ~~LOC~~ SOPN
14.0000 [IU] | PEN_INJECTOR | Freq: Every day | SUBCUTANEOUS | 0 refills | Status: DC
  Filled 2021-08-11: qty 15, 107d supply, fill #0

## 2021-08-12 ENCOUNTER — Other Ambulatory Visit: Payer: Self-pay

## 2021-08-17 ENCOUNTER — Other Ambulatory Visit: Payer: Self-pay

## 2021-08-18 ENCOUNTER — Other Ambulatory Visit: Payer: Self-pay

## 2021-08-19 ENCOUNTER — Other Ambulatory Visit: Payer: Self-pay

## 2021-09-11 IMAGING — NM NM HEPATO W/GB/PHARM/[PERSON_NAME]
2 series · 12 of 12 positions shown · non-contrast
Comparison: None.

CLINICAL DATA: Right upper quadrant abdominal pain.

EXAM:
NUCLEAR MEDICINE HEPATOBILIARY IMAGING WITH GALLBLADDER EF
TECHNIQUE: Sequential images of the abdomen were obtained [DATE] minutes
following intravenous administration of radiopharmaceutical. After
oral ingestion of Ensure, gallbladder ejection fraction was
determined. At 60 min, normal ejection fraction is greater than 33%.
RADIOPHARMACEUTICALS:  5.2 mCi Oc-11m  Choletec IV

[he hepatobiliary · 4.52mm/px · 6 of 60 frames shown (1 of 2)]
[frame 6/60]
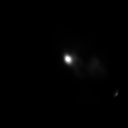
[frame 16/60]
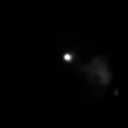
[frame 26/60]
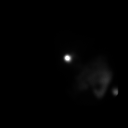
[frame 36/60]
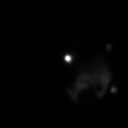
[frame 46/60]
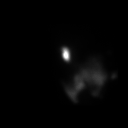
[frame 56/60]
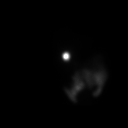

[he hepatobiliary · 4.52mm/px · 6 of 60 frames shown (2 of 2)]
[frame 6/60]
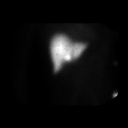
[frame 16/60]
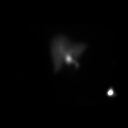
[frame 26/60]
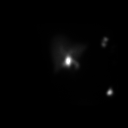
[frame 36/60]
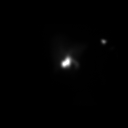
[frame 46/60]
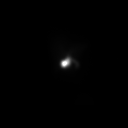
[frame 56/60]
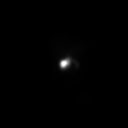

[12 of 12 positions shown; findings below may reference images not displayed]

FINDINGS: Prompt uptake and biliary excretion of activity by the liver is
seen. Gallbladder activity is visualized, consistent with patency of
cystic duct. Biliary activity passes into small bowel, consistent
with patent common bile duct.

Calculated gallbladder ejection fraction is 60%. (Normal gallbladder
ejection fraction with Ensure is greater than 33%.)
IMPRESSION: Normal gallbladder ejection fracture after Ensure administration.

## 2021-09-12 IMAGING — CR DG CHEST 2V
2 series · 2 of 2 positions shown · non-contrast
Comparison: 12/12/2020

CLINICAL DATA: ICD placement

EXAM:
CHEST - 2 VIEW

[chest lat]
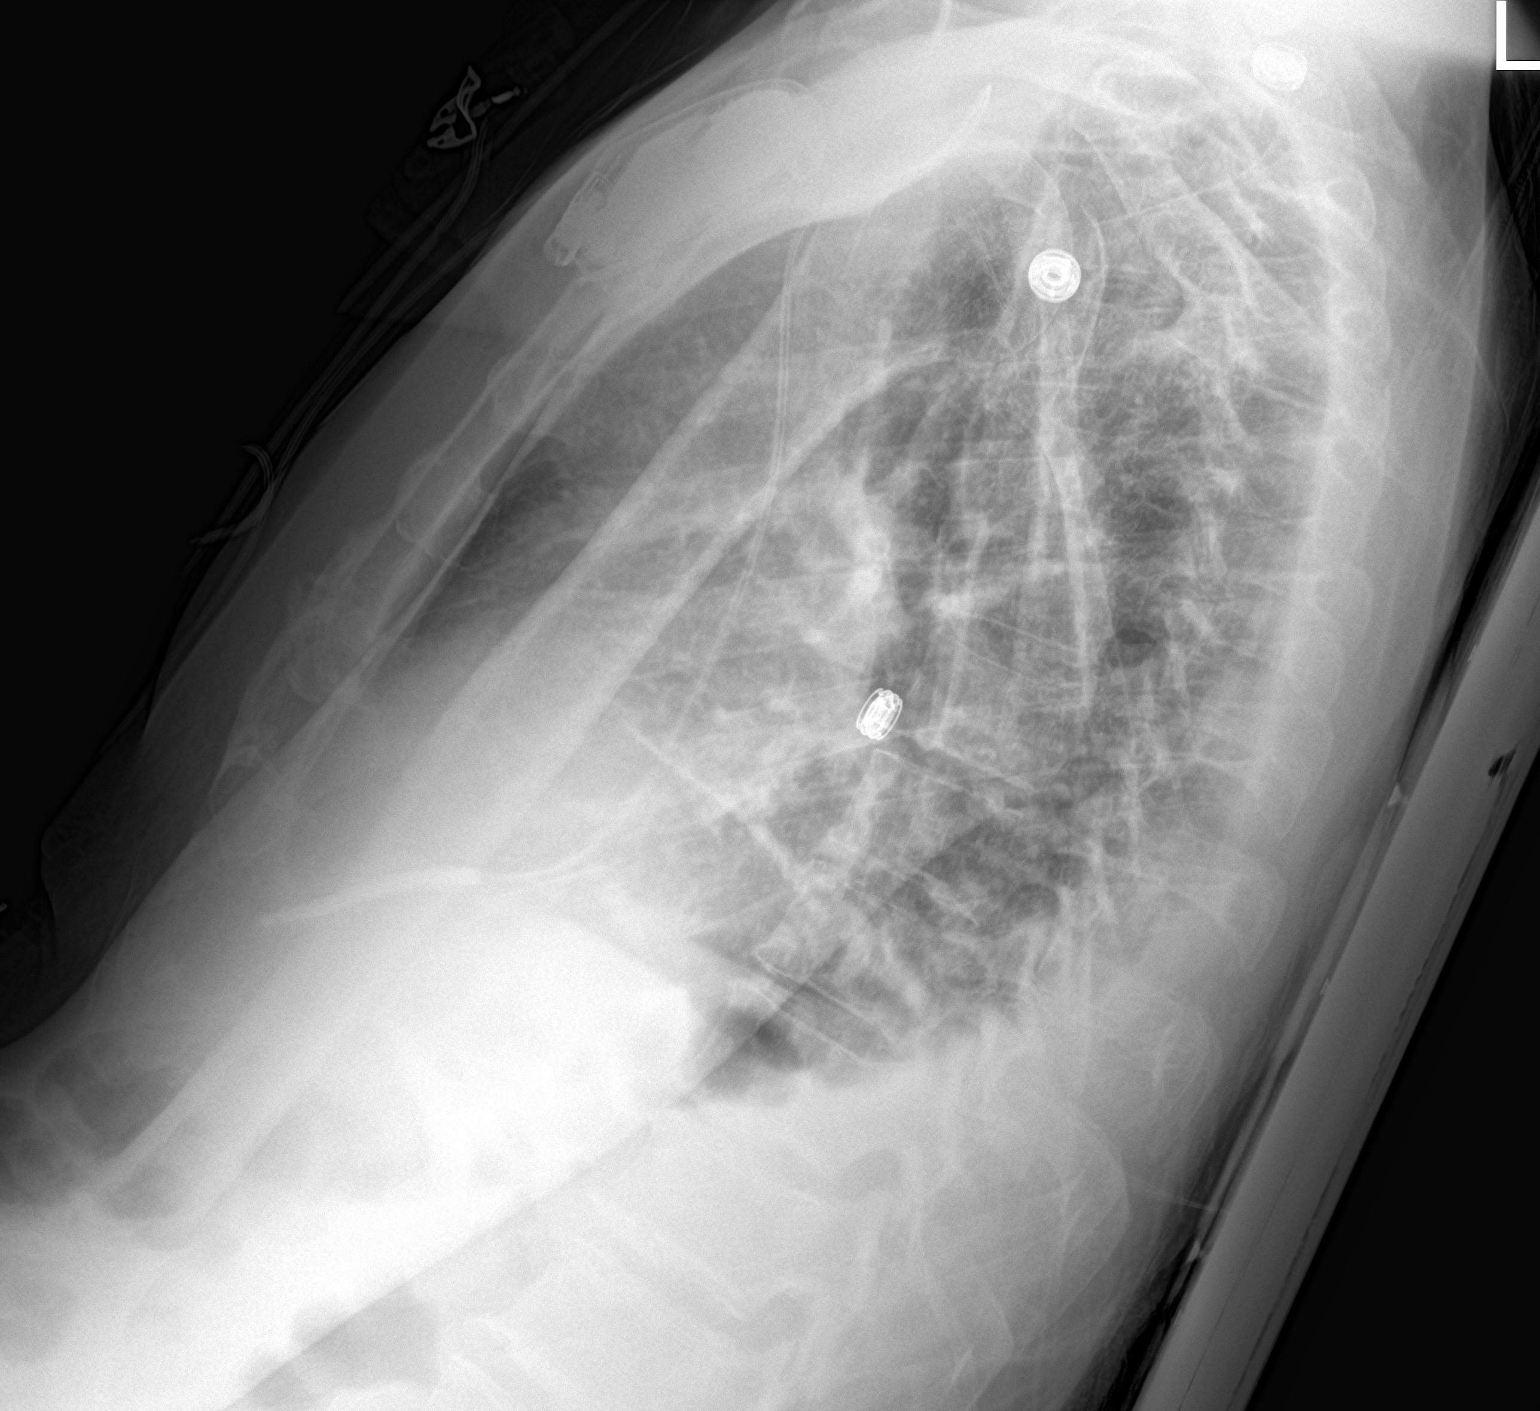

[chest ap]
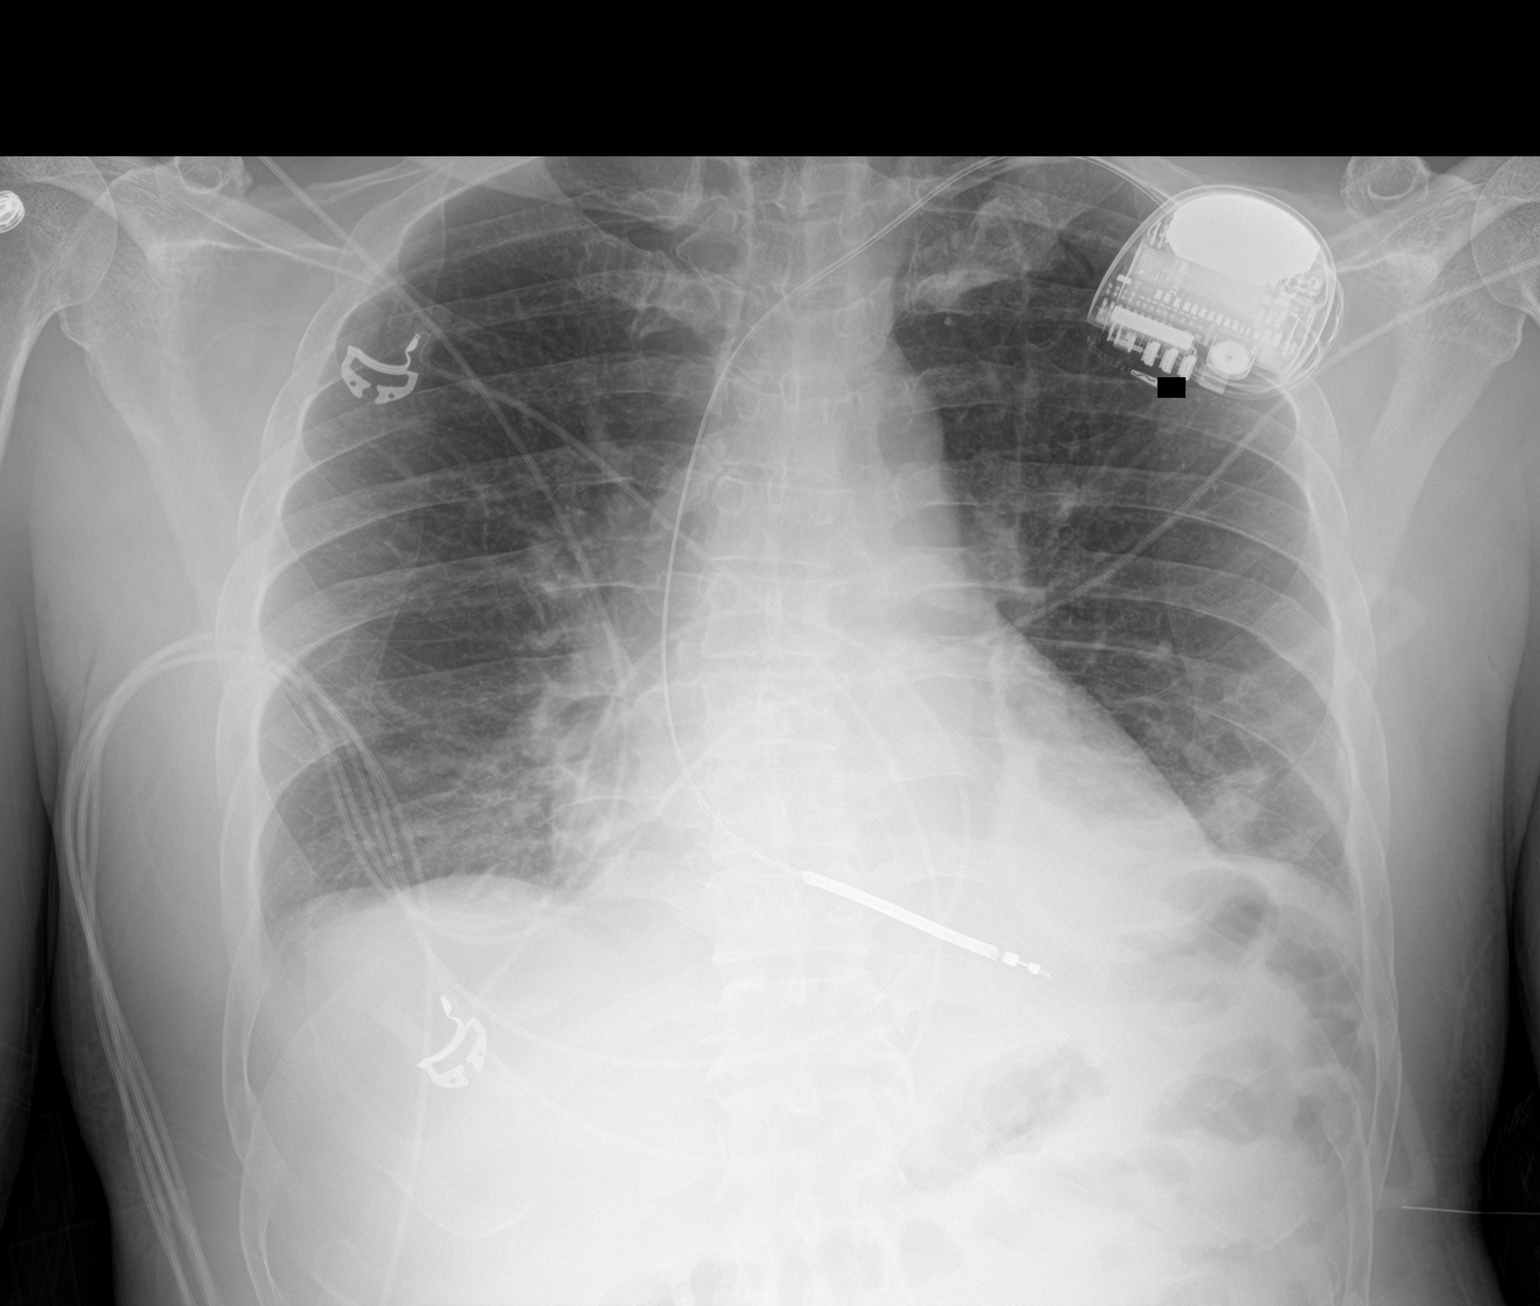

[2 of 2 positions shown; findings below may reference images not displayed]

FINDINGS: LEFT subclavian ICD with lead projecting at RIGHT ventricle.

Minimal enlargement of cardiac silhouette.

Mediastinal contours and pulmonary vascularity normal.

Bibasilar atelectasis and small pleural effusions.

Remaining lungs clear.

No segmental consolidation or pneumothorax.

Minimal biconvex scoliosis.
IMPRESSION: Bibasilar atelectasis and small pleural effusions.

ICD lead projects at RIGHT ventricle.

## 2021-09-17 ENCOUNTER — Ambulatory Visit: Payer: Self-pay

## 2021-10-05 ENCOUNTER — Other Ambulatory Visit: Payer: Self-pay

## 2021-11-17 ENCOUNTER — Encounter: Payer: Self-pay | Admitting: Critical Care Medicine

## 2021-11-27 NOTE — Telephone Encounter (Signed)
Error

## 2021-12-04 ENCOUNTER — Encounter: Payer: Self-pay | Admitting: Critical Care Medicine

## 2021-12-04 ENCOUNTER — Other Ambulatory Visit: Payer: Self-pay | Admitting: Critical Care Medicine

## 2021-12-04 ENCOUNTER — Other Ambulatory Visit: Payer: Self-pay

## 2021-12-04 MED ORDER — BASAGLAR KWIKPEN 100 UNIT/ML ~~LOC~~ SOPN
14.0000 [IU] | PEN_INJECTOR | Freq: Every day | SUBCUTANEOUS | 0 refills | Status: DC
  Filled 2021-12-04: qty 6, 42d supply, fill #0

## 2021-12-04 MED ORDER — INSULIN PEN NEEDLE 29G X 12.7MM MISC
1 refills | Status: DC
  Filled 2021-12-04: qty 100, 25d supply, fill #0

## 2021-12-04 MED ORDER — BASAGLAR KWIKPEN 100 UNIT/ML ~~LOC~~ SOPN: 14.0000 [IU] | PEN_INJECTOR | Freq: Every day | SUBCUTANEOUS | 0 refills | Status: DC

## 2021-12-04 NOTE — Telephone Encounter (Signed)
Courtesy refill.  Requested Prescriptions  Pending Prescriptions Disp Refills  . Insulin Glargine (BASAGLAR KWIKPEN) 100 UNIT/ML 15 mL 0    Sig: Inject 14 Units into the skin daily.     Endocrinology:  Diabetes - Insulins Failed - 12/04/2021 10:28 AM      Failed - HBA1C is between 0 and 7.9 and within 180 days    HbA1c, POC (controlled diabetic range)  Date Value Ref Range Status  07/29/2020 9.7 (A) 0.0 - 7.0 % Final   Hgb A1c MFr Bld  Date Value Ref Range Status  04/23/2021 15.0 (H) 4.8 - 5.6 % Final    Comment:             Prediabetes: 5.7 - 6.4          Diabetes: >6.4          Glycemic control for adults with diabetes: <7.0          Failed - Valid encounter within last 6 months    Recent Outpatient Visits          7 months ago Pneumonia of both lungs due to infectious organism, unspecified part of lung   [facility] Storm Frisk, MD   11 months ago Dilated cardiomyopathy [facility])   [facility] Storm Frisk, MD   1 year ago Uncontrolled type 2 diabetes mellitus with hyperglycemia [facility])   [facility] Storm Frisk, MD   1 year ago Uncontrolled type 2 diabetes mellitus with hyperglycemia [facility])   [facility] [place], Cornelius Moras, RPH-CPP   1 year ago Uncontrolled type 2 diabetes mellitus with hyperglycemia [facility])   [facility] Storm Frisk, MD      Future Appointments            In 1 week Storm Frisk, MD [facility]

## 2021-12-11 ENCOUNTER — Other Ambulatory Visit: Payer: Self-pay | Admitting: Pharmacist

## 2021-12-11 ENCOUNTER — Other Ambulatory Visit: Payer: Self-pay

## 2021-12-11 MED ORDER — TRUEPLUS 5-BEVEL PEN NEEDLES 32G X 4 MM MISC
1 refills | Status: DC
  Filled 2021-12-11: qty 100, 25d supply, fill #0

## 2021-12-15 ENCOUNTER — Other Ambulatory Visit: Payer: Self-pay

## 2021-12-17 ENCOUNTER — Encounter: Payer: Self-pay | Admitting: Critical Care Medicine

## 2021-12-17 ENCOUNTER — Ambulatory Visit
Admission: RE | Admit: 2021-12-17 | Discharge: 2021-12-17 | Disposition: A | Discharge status: Home / Self Care | Payer: Self-pay | Source: Ambulatory Visit | Attending: Critical Care Medicine | Admitting: Critical Care Medicine

## 2021-12-17 ENCOUNTER — Ambulatory Visit (INDEPENDENT_AMBULATORY_CARE_PROVIDER_SITE_OTHER): Payer: Self-pay

## 2021-12-17 ENCOUNTER — Ambulatory Visit: Payer: Self-pay | Attending: Critical Care Medicine | Admitting: Critical Care Medicine

## 2021-12-17 ENCOUNTER — Other Ambulatory Visit: Payer: Self-pay

## 2021-12-17 VITALS — BP 133/77 | HR 85 | Temp 98.0°F | Ht 72.0 in | Wt 156.4 lb

## 2021-12-17 DIAGNOSIS — E1165 Type 2 diabetes mellitus with hyperglycemia: Secondary | ICD-10-CM

## 2021-12-17 DIAGNOSIS — J209 Acute bronchitis, unspecified: Secondary | ICD-10-CM

## 2021-12-17 DIAGNOSIS — F332 Major depressive disorder, recurrent severe without psychotic features: Secondary | ICD-10-CM

## 2021-12-17 DIAGNOSIS — I428 Other cardiomyopathies: Secondary | ICD-10-CM

## 2021-12-17 DIAGNOSIS — J189 Pneumonia, unspecified organism: Secondary | ICD-10-CM

## 2021-12-17 DIAGNOSIS — F1411 Cocaine abuse, in remission: Secondary | ICD-10-CM

## 2021-12-17 DIAGNOSIS — N529 Male erectile dysfunction, unspecified: Secondary | ICD-10-CM

## 2021-12-17 DIAGNOSIS — I420 Dilated cardiomyopathy: Secondary | ICD-10-CM

## 2021-12-17 DIAGNOSIS — E7800 Pure hypercholesterolemia, unspecified: Secondary | ICD-10-CM

## 2021-12-17 DIAGNOSIS — I5042 Chronic combined systolic (congestive) and diastolic (congestive) heart failure: Secondary | ICD-10-CM

## 2021-12-17 DIAGNOSIS — Z87891 Personal history of nicotine dependence: Secondary | ICD-10-CM

## 2021-12-17 DIAGNOSIS — I10 Essential (primary) hypertension: Secondary | ICD-10-CM

## 2021-12-17 LAB — CUP PACEART REMOTE DEVICE CHECK
Battery Remaining Longevity: 132 mo
Battery Voltage: 3.03 V
Brady Statistic RV Percent Paced: 0 %
Date Time Interrogation Session: 20230907033423
HighPow Impedance: 64 Ohm
Implantable Lead Implant Date: 20220906
Implantable Lead Location: 753860
Implantable Pulse Generator Implant Date: 20220906
Lead Channel Impedance Value: 304 Ohm
Lead Channel Impedance Value: 361 Ohm
Lead Channel Pacing Threshold Amplitude: 0.625 V
Lead Channel Pacing Threshold Pulse Width: 0.4 ms
Lead Channel Sensing Intrinsic Amplitude: 10.625 mV
Lead Channel Sensing Intrinsic Amplitude: 10.625 mV
Lead Channel Setting Pacing Amplitude: 2 V
Lead Channel Setting Pacing Pulse Width: 0.4 ms
Lead Channel Setting Sensing Sensitivity: 0.3 mV

## 2021-12-17 MED ORDER — VITAMIN D 25 MCG (1000 UNIT) PO TABS
1000.0000 [IU] | ORAL_TABLET | Freq: Every day | ORAL | 1 refills | Status: DC
  Filled 2021-12-17: qty 60, 60d supply, fill #0

## 2021-12-17 MED ORDER — AZITHROMYCIN 250 MG PO TABS
ORAL_TABLET | ORAL | 0 refills | Status: DC
  Filled 2021-12-17: qty 6, 5d supply, fill #0

## 2021-12-17 MED ORDER — GLUCOSE BLOOD VI STRP
ORAL_STRIP | 12 refills | Status: DC
  Filled 2021-12-17: qty 100, 50d supply, fill #0
  Filled 2022-06-25: qty 100, 50d supply, fill #1

## 2021-12-17 MED ORDER — CARVEDILOL 25 MG PO TABS
25.0000 mg | ORAL_TABLET | Freq: Two times a day (BID) | ORAL | 6 refills | Status: DC
  Filled 2021-12-17: qty 60, 30d supply, fill #0
  Filled 2022-06-09: qty 60, 30d supply, fill #1
  Filled 2022-07-07: qty 60, 30d supply, fill #2
  Filled 2022-08-11: qty 60, 30d supply, fill #3

## 2021-12-17 MED ORDER — TRUEPLUS 5-BEVEL PEN NEEDLES 32G X 4 MM MISC: 1 refills | Status: DC

## 2021-12-17 MED ORDER — ATORVASTATIN CALCIUM 40 MG PO TABS
40.0000 mg | ORAL_TABLET | Freq: Every day | ORAL | 1 refills | Status: DC
  Filled 2021-12-17: qty 30, 30d supply, fill #0
  Filled 2022-06-01: qty 30, 30d supply, fill #1

## 2021-12-17 MED ORDER — DAPAGLIFLOZIN PROPANEDIOL 10 MG PO TABS
10.0000 mg | ORAL_TABLET | Freq: Every day | ORAL | 2 refills | Status: DC
  Filled 2021-12-17: qty 30, 30d supply, fill #0

## 2021-12-17 MED ORDER — TRUEPLUS LANCETS 28G MISC
1 refills | Status: AC
  Filled 2021-12-17: qty 100, 50d supply, fill #0

## 2021-12-17 MED ORDER — METFORMIN HCL 1000 MG PO TABS
1000.0000 mg | ORAL_TABLET | Freq: Two times a day (BID) | ORAL | 2 refills | Status: DC
  Filled 2021-12-17: qty 120, 60d supply, fill #0

## 2021-12-17 MED ORDER — SPIRONOLACTONE 25 MG PO TABS
12.5000 mg | ORAL_TABLET | Freq: Every day | ORAL | 4 refills | Status: DC
  Filled 2021-12-17: qty 30, 60d supply, fill #0

## 2021-12-17 MED ORDER — SACUBITRIL-VALSARTAN 24-26 MG PO TABS
1.0000 | ORAL_TABLET | Freq: Two times a day (BID) | ORAL | 4 refills | Status: DC
  Filled 2021-12-17: qty 60, 30d supply, fill #0

## 2021-12-17 MED ORDER — BASAGLAR KWIKPEN 100 UNIT/ML ~~LOC~~ SOPN
14.0000 [IU] | PEN_INJECTOR | Freq: Every day | SUBCUTANEOUS | 3 refills | Status: DC
  Filled 2021-12-17: qty 15, 107d supply, fill #0
  Filled 2022-02-09: qty 6, 42d supply, fill #0
  Filled 2022-03-15: qty 6, 42d supply, fill #1
  Filled 2022-04-30: qty 6, 42d supply, fill #2
  Filled 2022-06-09 (×2): qty 6, 42d supply, fill #3

## 2021-12-17 NOTE — Assessment & Plan Note (Signed)
Stable at this time 

## 2021-12-17 NOTE — Assessment & Plan Note (Signed)
Blood pressure is not significantly elevated at this visit nevertheless will resume heart failure medications as previously prescribed

## 2021-12-17 NOTE — Assessment & Plan Note (Signed)
  .   Current smoking consumption amount: One half pack a week  Dicsussion on advise to quit smoking and smoking impacts: Cardiovascular impacts  Patient's willingness to quit: Not ready to quit  Methods to quit smoking discussed: Not discussed  Medication management of smoking session drugs discussed: Not discussed  Resources provided:  AVS   Setting quit date not yet obtained  Follow-up arranged short-term follow-up   Time spent counseling the patient: 5 minutes  

## 2021-12-17 NOTE — Progress Notes (Signed)
Patient states that he has had an ongoing cough for 3 weeks.

## 2021-12-17 NOTE — Assessment & Plan Note (Signed)
We will resume [name].Aldactone and carvedilol  Referral back to cardiology will be made

## 2021-12-17 NOTE — Assessment & Plan Note (Signed)
Previous history of pneumonia we will repeat x-ray at this visit and prescribe azithromycin for 5 days

## 2021-12-17 NOTE — Progress Notes (Signed)
Established Patient Office Visit  Subjective:  Patient ID: James [name]., male    DOB: August 01, 1960  Age: 61 y.o. MRN: [id number]  CC:  Chief Complaint  Patient presents with   Diabetes    HPI  04/23/21 James [name]. presents for complaint of ankle swelling. He states that he first noticed it on 04/21/21. He states that it has been painful. He states he has no other symptoms.  In December 2022, he was seen in the ED for pneumonia of both lungs, and treated with antibiotics. He states that he has been feeling much better since then, and that he is not coughing or experiencing any problems with his breathing right now.  The patient also presented to the ED after a drug overdose. He was treated with Narcan and was discharged home with stable vitals and labs. He states that this was disappointing to him, as he had not been using any illicit substances for 18 months prior to this incident. He states that he has not used any substances since December 24, and he does not want to use anymore. He reports that his mood is good at this time.  This patient has a history of Type II diabetes. He states that he measures his sugars once a day, in the morning, and states that his values have been high recently. Per the patient, they have been in 360-380 range. He thinks this may be due to inconsistent medication adherence in December when he was using drugs. Since December 24, he states he has been taking all of his diabetes medications as prescribed. He states that this morning's value was 96.  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 He is followed by cardiology (Dr. 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 He states that he is taking  his prescribed medications.   9/7 This patient seen in return follow-up has not been seen since January of this year.  He was in a 90-day cocaine rehab treatment center and has  been out for 30 days thus he has been sober for 120 days.  He has had a increased cough for the last 2 weeks productive of green mucus.  He has no other referable symptoms to COVID.  Blood sugars have been in the 100-140 range and he does smoke a pack of cigarettes a week.  He has been off some of his heart failure medications.  He needs to get back in with cardiology as well.  Past Medical History:  Diagnosis Date   Cellulitis and abscess of foot 63/84/5364   Chronic systolic heart failure (HCC)    Cocaine abuse (HCC)    Cocaine dependence with cocaine-induced mood disorder ([place])    Depression    Diabetes mellitus without complication ([place])    Essential hypertension    Multiple closed fractures of ribs of both sides    NICM (nonischemic cardiomyopathy) ([place]) EF 20-25%    Suicidal ideations 07/12/2017    Past Surgical History:  Procedure Laterality Date   CARDIAC CATHETERIZATION N/A 04/30/2016   Procedure: Right/Left Heart Cath and Coronary Angiography;  Surgeon: Nelva Bush, MD;  Location: [facility];  Service: Cardiovascular;  Laterality: N/A;   FINGER SURGERY     ICD IMPLANT N/A 12/16/2020   Procedure: ICD IMPLANT;  Surgeon: Vickie Epley, MD;  Location: [facility];  Service: Cardiovascular;  Laterality: N/A;   LEFT HEART CATH AND CORONARY ANGIOGRAPHY N/A  12/12/2020   Procedure: LEFT HEART CATH AND CORONARY ANGIOGRAPHY;  Surgeon: Lennette Bihari, MD;  Location: [facility];  Service: Cardiovascular;  Laterality: N/A;    Family History  Problem Relation Age of Onset   Diabetes Other    Hypertension Other    Diabetes Mother    Brain cancer Father    Mental illness Brother    Drug abuse Brother    Diabetes Brother     Social History   Socioeconomic History   Marital status: Married    Spouse name: Not on file   Number of children: Not on file   Years of education: Not on file   Highest education level: Not on file  Occupational History   Not on  file  Tobacco Use   Smoking status: Every Day    Packs/day: 0.50    Years: 23.00    Total pack years: 11.50    Types: Cigarettes   Smokeless tobacco: Never   Tobacco comments:    Every other day   Vaping Use   Vaping Use: Never used  Substance and Sexual Activity   Alcohol use: Yes   Drug use: Not Currently    Types: Cocaine, "Crack" cocaine   Sexual activity: Not Currently  Other Topics Concern   Not on file  Social History Narrative   Not on file   Social Determinants of Health   Financial Resource Strain: Not on file  Food Insecurity: Not on file  Transportation Needs: Not on file  Physical Activity: Not on file  Stress: Not on file  Social Connections: Not on file  Intimate Partner Violence: Not on file    Outpatient Medications Prior to Visit  Medication Sig Dispense Refill   aspirin 81 MG chewable tablet Chew 81 mg by mouth daily. 1Daily     blood glucose meter kit and supplies Dispense based on patient and insurance preference. Use up to four times daily as directed. (FOR ICD-9 250.00, 250.01). 1 each 0   Blood Glucose Monitoring Suppl (TRUE METRIX METER) w/Device KIT Use to measure blood sugar twice a day 1 kit 0   atorvastatin (LIPITOR) 40 MG tablet Take 1 tablet (40 mg total) by mouth daily. 90 tablet 1   carvedilol (COREG) 25 MG tablet Take 1 tablet (25 mg total) by mouth 2 (two) times daily with a meal. 60 tablet 6   cholecalciferol (VITAMIN D3) 25 MCG (1000 UNIT) tablet Take 1,000 Units by mouth daily.     dapagliflozin propanediol (FARXIGA) 10 MG TABS tablet Take 1 tablet (10 mg total) by mouth daily before breakfast. 30 tablet 2   glucose blood test strip USE TO CHECK BLOOD SUGARS 2 TIMES DAILY 100 strip 12   Insulin Glargine (BASAGLAR KWIKPEN) 100 UNIT/ML Inject 14 Units into the skin daily. 15 mL 0   Insulin Pen Needle (TRUEPLUS 5-BEVEL PEN NEEDLES) 32G X 4 MM MISC Use to inject Basaglar once a day. 100 each 1   spironolactone (ALDACTONE) 25 MG tablet  Take 0.5 tablets (12.5 mg total) by mouth daily. 30 tablet 4   TRUEplus Lancets 28G MISC Use to measure blood sugar twice a day 100 each 1   dextromethorphan-guaiFENesin (MUCINEX DM) 30-600 MG 12hr tablet Take 1 tablet by mouth 2 (two) times daily. (Patient not taking: Reported on 12/17/2021) 14 tablet 1   furosemide (LASIX) 20 MG tablet Take 2 tablets (40 mg total) by mouth daily. (Patient not taking: Reported on 12/17/2021) 90 tablet 3  metFORMIN (GLUCOPHAGE) 1000 MG tablet Take 1 tablet (1,000 mg total) by mouth 2 (two) times daily with a meal. 120 tablet 2   sacubitril-valsartan (ENTRESTO) 24-26 MG Take 1 tablet by mouth 2 (two) times daily. (Patient not taking: Reported on 12/17/2021) 60 tablet 4   No facility-administered medications prior to visit.    No Known Allergies  ROS Review of Systems  Constitutional: Negative.   HENT: Negative.  Negative for ear pain, postnasal drip, rhinorrhea, sinus pressure, sore throat, trouble swallowing and voice change.   Eyes: Negative.   Respiratory: Negative.  Negative for apnea, cough, choking, chest tightness, shortness of breath, wheezing and stridor.   Cardiovascular:  Positive for leg swelling. Negative for chest pain and palpitations.  Gastrointestinal: Negative.  Negative for abdominal distention, abdominal pain, nausea and vomiting.  Endocrine: Negative.   Genitourinary: Negative.   Musculoskeletal: Negative.  Negative for arthralgias and myalgias.  Skin: Negative.  Negative for rash.  Allergic/Immunologic: Negative.  Negative for environmental allergies and food allergies.  Neurological: Negative.  Negative for dizziness, syncope, weakness and headaches.  Hematological: Negative.  Negative for adenopathy. Does not bruise/bleed easily.  Psychiatric/Behavioral: Negative.  Negative for agitation, dysphoric mood, sleep disturbance and suicidal ideas. The patient is not nervous/anxious.       Objective:    Physical Exam Constitutional:       General: He is not in acute distress.    Appearance: Normal appearance.  HENT:     Head: Normocephalic and atraumatic.  Cardiovascular:     Rate and Rhythm: Normal rate and regular rhythm.     Pulses: Normal pulses.     Heart sounds: Murmur heard.     Systolic (holosystolic, best heard at left lower sternal border) murmur is present.     No friction rub. No gallop.  Pulmonary:     Breath sounds: Wheezing present. No rhonchi or rales.  Musculoskeletal:     Right lower leg: No edema.     Left lower leg: No edema.  Skin:    General: Skin is warm and dry.  Neurological:     General: No focal deficit present.     Mental Status: He is alert and oriented to person, place, and time.  Psychiatric:        Mood and Affect: Mood normal.        Behavior: Behavior normal.     BP 133/77   Pulse 85   Temp 98 F (36.7 C) (Oral)   Ht 6' (1.829 m)   Wt 156 lb 6.4 oz (70.9 kg)   SpO2 99%   BMI 21.21 kg/m  Wt Readings from Last 3 Encounters:  12/17/21 156 lb 6.4 oz (70.9 kg)  04/23/21 146 lb (66.2 kg)  04/09/21 156 lb 15.5 oz (71.2 kg)     Health Maintenance Due  Topic Date Due   OPHTHALMOLOGY EXAM  Never done   COLON CANCER SCREENING ANNUAL FOBT  12/25/2020   HEMOGLOBIN A1C  10/21/2021    There are no preventive care reminders to display for this patient.  Lab Results  Component Value Date   TSH 2.155 01/16/2017   Lab Results  Component Value Date   WBC 3.0 (L) 06/18/2021   HGB 12.6 (L) 06/18/2021   HCT 38.5 (L) 06/18/2021   MCV 89.7 06/18/2021   PLT 183 06/18/2021   Lab Results  Component Value Date   NA 137 06/18/2021   K 4.8 06/18/2021   CO2 30 06/18/2021   GLUCOSE 187 (  H) 06/18/2021   BUN 9 06/18/2021   CREATININE 0.74 06/18/2021   BILITOT 1.0 06/18/2021   ALKPHOS 48 06/18/2021   AST 46 (H) 06/18/2021   ALT 42 06/18/2021   PROT 5.3 (L) 06/18/2021   ALBUMIN 3.0 (L) 06/18/2021   CALCIUM 8.3 (L) 06/18/2021   ANIONGAP 9 06/18/2021   EGFR 101 04/23/2021    Lab Results  Component Value Date   CHOL 163 04/23/2021   Lab Results  Component Value Date   HDL 93 04/23/2021   Lab Results  Component Value Date   LDLCALC 59 04/23/2021   Lab Results  Component Value Date   TRIG 51 04/23/2021   Lab Results  Component Value Date   CHOLHDL 1.8 04/23/2021   Lab Results  Component Value Date   HGBA1C 15.0 (H) 04/23/2021      Assessment & Plan:   Problem List Items Addressed This Visit       Cardiovascular and Mediastinum   Essential hypertension    Blood pressure is not significantly elevated at this visit nevertheless will resume heart failure medications as previously prescribed      Relevant Medications   atorvastatin (LIPITOR) 40 MG tablet   carvedilol (COREG) 25 MG tablet   sacubitril-valsartan (ENTRESTO) 24-26 MG   spironolactone (ALDACTONE) 25 MG tablet   Chronic combined systolic and diastolic heart failure (HCC)    We will resume Lisabeth Register.Aldactone and carvedilol  Referral back to cardiology will be made      Relevant Medications   atorvastatin (LIPITOR) 40 MG tablet   carvedilol (COREG) 25 MG tablet   sacubitril-valsartan (ENTRESTO) 24-26 MG   spironolactone (ALDACTONE) 25 MG tablet     Respiratory   Pneumonia of both lungs due to infectious organism    Previous history of pneumonia we will repeat x-ray at this visit and prescribe azithromycin for 5 days      Relevant Medications   azithromycin (ZITHROMAX) 250 MG tablet     Endocrine   Uncontrolled type 2 diabetes mellitus with hyperglycemia (HCC)    Type 2 diabetes will need to be reassessed with labs include urine for albumin continue with metformin and insulin Basaglar      Relevant Medications   atorvastatin (LIPITOR) 40 MG tablet   dapagliflozin propanediol (FARXIGA) 10 MG TABS tablet   Insulin Glargine (BASAGLAR KWIKPEN) 100 UNIT/ML   metFORMIN (GLUCOPHAGE) 1000 MG tablet   Other Relevant Orders   Hemoglobin A1c   Comprehensive  metabolic panel   Microalbumin / creatinine urine ratio     Other   HYPERCHOLESTEROLEMIA    Need to resume statin therapy      Relevant Medications   atorvastatin (LIPITOR) 40 MG tablet   carvedilol (COREG) 25 MG tablet   sacubitril-valsartan (ENTRESTO) 24-26 MG   spironolactone (ALDACTONE) 25 MG tablet   Other Relevant Orders   Lipid panel   History of cocaine abuse ([place])    Patient's not currently using cocaine      MDD (major depressive disorder)    Stable at this time      History of tobacco use       Current smoking consumption amount: One half pack a week  Dicsussion on advise to quit smoking and smoking impacts: Cardiovascular impacts  Patient's willingness to quit: Not ready to quit  Methods to quit smoking discussed: Not discussed  Medication management of smoking session drugs discussed: Not discussed  Resources provided:  AVS   Setting quit date not yet obtained  Follow-up arranged short-term follow-up   Time spent counseling the patient: 5 minutes       Vasculogenic erectile dysfunction    Patient wants Cialis renewed I told him we have to hold off until we get his cardiopulmonary status improved      Other Visit Diagnoses     Acute bronchitis, unspecified organism    -  Primary   Relevant Orders   DG Chest 2 View   CBC with Differential/Platelet   Nonischemic cardiomyopathy (HCC)       Relevant Medications   atorvastatin (LIPITOR) 40 MG tablet   carvedilol (COREG) 25 MG tablet   sacubitril-valsartan (ENTRESTO) 24-26 MG   spironolactone (ALDACTONE) 25 MG tablet      Meds ordered this encounter  Medications   atorvastatin (LIPITOR) 40 MG tablet    Sig: Take 1 tablet (40 mg total) by mouth daily.    Dispense:  90 tablet    Refill:  1   carvedilol (COREG) 25 MG tablet    Sig: Take 1 tablet (25 mg total) by mouth 2 (two) times daily with a meal.    Dispense:  60 tablet    Refill:  6    For future refills   cholecalciferol (VITAMIN  D3) 25 MCG (1000 UNIT) tablet    Sig: Take 1 tablet (1,000 Units total) by mouth daily.    Dispense:  60 tablet    Refill:  1   dapagliflozin propanediol (FARXIGA) 10 MG TABS tablet    Sig: Take 1 tablet (10 mg total) by mouth daily before breakfast.    Dispense:  30 tablet    Refill:  2    PASS   glucose blood test strip    Sig: USE TO CHECK BLOOD SUGARS 2 TIMES DAILY    Dispense:  100 strip    Refill:  12   Insulin Glargine (BASAGLAR KWIKPEN) 100 UNIT/ML    Sig: Inject 14 Units into the skin daily.    Dispense:  15 mL    Refill:  3   Insulin Pen Needle (TRUEPLUS 5-BEVEL PEN NEEDLES) 32G X 4 MM MISC    Sig: Use to inject Basaglar once a day.    Dispense:  100 each    Refill:  1   metFORMIN (GLUCOPHAGE) 1000 MG tablet    Sig: Take 1 tablet (1,000 mg total) by mouth 2 (two) times daily with a meal.    Dispense:  120 tablet    Refill:  2   sacubitril-valsartan (ENTRESTO) 24-26 MG    Sig: Take 1 tablet by mouth 2 (two) times daily.    Dispense:  60 tablet    Refill:  4    PASS   spironolactone (ALDACTONE) 25 MG tablet    Sig: Take 0.5 tablets (12.5 mg total) by mouth daily.    Dispense:  30 tablet    Refill:  4   TRUEplus Lancets 28G MISC    Sig: Use to measure blood sugar twice a day    Dispense:  100 each    Refill:  1   azithromycin (ZITHROMAX) 250 MG tablet    Sig: Take 2 tablets by mouth on day 1, then 1 tablet by mouth daily until gone.    Dispense:  6 tablet    Refill:  0    38 minutes spent reviewing history and physical educating patient complex decision making high Follow-up: Return in about 2 weeks (around 12/31/2021) for diabetes, asthma, heart failure.  Asencion Noble, MD

## 2021-12-17 NOTE — Assessment & Plan Note (Signed)
Type 2 diabetes will need to be reassessed with labs include urine for albumin continue with metformin and insulin Basaglar

## 2021-12-17 NOTE — Assessment & Plan Note (Signed)
Need to resume statin therapy

## 2021-12-17 NOTE — Assessment & Plan Note (Signed)
Patient wants Cialis renewed I told him we have to hold off until we get his cardiopulmonary status improved

## 2021-12-17 NOTE — Patient Instructions (Signed)
Obtain chest x-ray today  Take azithromycin for 5 days for bronchitis  Refills on all other medications sent to pharmacy downstairs but note we have stopping furosemide for now  Prefer to hold off on any erectile dysfunction medication till we see how your lungs improve  Return to Dr. Delford Field in 2 weeks  Complete set of screening labs obtained at this visit

## 2021-12-17 NOTE — Assessment & Plan Note (Signed)
Patient's not currently using cocaine

## 2021-12-18 ENCOUNTER — Telehealth: Payer: Self-pay

## 2021-12-18 LAB — COMPREHENSIVE METABOLIC PANEL
ALT: 23 IU/L (ref 0–44)
AST: 16 IU/L (ref 0–40)
Albumin/Globulin Ratio: 1.8 (ref 1.2–2.2)
Albumin: 4.5 g/dL (ref 3.9–4.9)
Alkaline Phosphatase: 88 IU/L (ref 44–121)
BUN/Creatinine Ratio: 22 (ref 10–24)
BUN: 20 mg/dL (ref 8–27)
Bilirubin Total: <0.2 mg/dL (ref 0.0–1.2)
CO2: 20 mmol/L (ref 20–29)
Calcium: 9.9 mg/dL (ref 8.6–10.2)
Chloride: 103 mmol/L (ref 96–106)
Creatinine, Ser: 0.93 mg/dL (ref 0.76–1.27)
Globulin, Total: 2.5 g/dL (ref 1.5–4.5)
Glucose: 194 mg/dL — ABNORMAL HIGH (ref 70–99)
Potassium: 4.5 mmol/L (ref 3.5–5.2)
Sodium: 141 mmol/L (ref 134–144)
Total Protein: 7 g/dL (ref 6.0–8.5)
eGFR: 92 mL/min/{1.73_m2} (ref >59)

## 2021-12-18 LAB — CBC WITH DIFFERENTIAL/PLATELET
Basophils Absolute: 0 10*3/uL (ref 0.0–0.2)
Basos: 1 %
EOS (ABSOLUTE): 0.4 10*3/uL (ref 0.0–0.4)
Eos: 11 %
Hematocrit: 39.1 % (ref 37.5–51.0)
Hemoglobin: 12.9 g/dL — ABNORMAL LOW (ref 13.0–17.7)
Immature Grans (Abs): 0 10*3/uL (ref 0.0–0.1)
Immature Granulocytes: 0 %
Lymphocytes Absolute: 1.2 10*3/uL (ref 0.7–3.1)
Lymphs: 31 %
MCH: 27.8 pg (ref 26.6–33.0)
MCHC: 33 g/dL (ref 31.5–35.7)
MCV: 84 fL (ref 79–97)
Monocytes Absolute: 0.3 10*3/uL (ref 0.1–0.9)
Monocytes: 8 %
Neutrophils Absolute: 1.9 10*3/uL (ref 1.4–7.0)
Neutrophils: 49 %
Platelets: 241 10*3/uL (ref 150–450)
RBC: 4.64 x10E6/uL (ref 4.14–5.80)
RDW: 12.9 % (ref 11.6–15.4)
WBC: 3.9 10*3/uL (ref 3.4–10.8)

## 2021-12-18 LAB — MICROALBUMIN / CREATININE URINE RATIO
Creatinine, Urine: 70.5 mg/dL
Microalb/Creat Ratio: 14 mg/g creat (ref 0–29)
Microalbumin, Urine: 9.9 ug/mL

## 2021-12-18 LAB — LIPID PANEL
Chol/HDL Ratio: 2.6 ratio (ref 0.0–5.0)
Cholesterol, Total: 182 mg/dL (ref 100–199)
HDL: 69 mg/dL (ref >39)
LDL Chol Calc (NIH): 99 mg/dL (ref 0–99)
Triglycerides: 73 mg/dL (ref 0–149)
VLDL Cholesterol Cal: 14 mg/dL (ref 5–40)

## 2021-12-18 LAB — HEMOGLOBIN A1C
Est. average glucose Bld gHb Est-mCnc: 301 mg/dL
Hgb A1c MFr Bld: 12.1 % — ABNORMAL HIGH (ref 4.8–5.6)

## 2021-12-18 NOTE — Progress Notes (Signed)
Let pt know chest xray normal. No medication changes

## 2021-12-18 NOTE — Telephone Encounter (Signed)
Called patient but phone is not in service.

## 2021-12-20 NOTE — Progress Notes (Signed)
Let pt know A1C still very high 12, Blood sugar 194   no kidney or liver damage, blood count normal, cholesterol at goal, urine alb normal no kidney damage from diabetes, take medications as prescribed

## 2021-12-21 ENCOUNTER — Telehealth: Payer: Self-pay

## 2021-12-21 NOTE — Telephone Encounter (Signed)
-----   Message from Storm Frisk, MD sent at 12/20/2021  1:24 PM EDT ----- Let pt know A1C still very high 12, Blood sugar 194   no kidney or liver damage, blood count normal, cholesterol at goal, urine alb normal no kidney damage from diabetes, take medications as prescribed

## 2021-12-21 NOTE — Telephone Encounter (Signed)
Pt was called and vm was left, Information has been sent to nurse pool.   

## 2021-12-25 ENCOUNTER — Telehealth: Payer: Self-pay

## 2021-12-25 NOTE — Telephone Encounter (Signed)
Pt returned our call for lab results.  Storm Frisk, MD  12/20/2021  1:24 PM EDT     Let pt know A1C still very high 12, Blood sugar 194   no kidney or liver damage, blood count normal, cholesterol at goal, urine alb normal no kidney damage from diabetes, take medications as prescribed    Pt would like help with diabetes management. He needs to know what to eat to reduce his A1C. Pt is interested in help from a dietician, or other diabetes management.

## 2021-12-30 ENCOUNTER — Encounter: Payer: Self-pay | Admitting: Critical Care Medicine

## 2021-12-30 ENCOUNTER — Other Ambulatory Visit: Payer: Self-pay

## 2021-12-30 ENCOUNTER — Ambulatory Visit: Payer: Self-pay | Attending: Critical Care Medicine | Admitting: Critical Care Medicine

## 2021-12-30 VITALS — BP 133/79 | HR 90 | Ht 72.0 in | Wt 158.6 lb

## 2021-12-30 DIAGNOSIS — B351 Tinea unguium: Secondary | ICD-10-CM

## 2021-12-30 DIAGNOSIS — I428 Other cardiomyopathies: Secondary | ICD-10-CM

## 2021-12-30 DIAGNOSIS — E7800 Pure hypercholesterolemia, unspecified: Secondary | ICD-10-CM

## 2021-12-30 DIAGNOSIS — Z1211 Encounter for screening for malignant neoplasm of colon: Secondary | ICD-10-CM

## 2021-12-30 DIAGNOSIS — F1411 Cocaine abuse, in remission: Secondary | ICD-10-CM

## 2021-12-30 DIAGNOSIS — I5042 Chronic combined systolic (congestive) and diastolic (congestive) heart failure: Secondary | ICD-10-CM

## 2021-12-30 DIAGNOSIS — J189 Pneumonia, unspecified organism: Secondary | ICD-10-CM

## 2021-12-30 DIAGNOSIS — N529 Male erectile dysfunction, unspecified: Secondary | ICD-10-CM

## 2021-12-30 DIAGNOSIS — Z23 Encounter for immunization: Secondary | ICD-10-CM

## 2021-12-30 DIAGNOSIS — I10 Essential (primary) hypertension: Secondary | ICD-10-CM

## 2021-12-30 DIAGNOSIS — E1165 Type 2 diabetes mellitus with hyperglycemia: Secondary | ICD-10-CM

## 2021-12-30 LAB — GLUCOSE, POCT (MANUAL RESULT ENTRY): POC Glucose: 232 mg/dl — AB (ref 70–99)

## 2021-12-30 MED ORDER — CICLOPIROX 8 % EX SOLN
Freq: Every day | CUTANEOUS | 0 refills | Status: DC
  Filled 2021-12-30: qty 6.6, 7d supply, fill #0

## 2021-12-30 MED ORDER — SILDENAFIL CITRATE 50 MG PO TABS
50.0000 mg | ORAL_TABLET | Freq: Every day | ORAL | 0 refills | Status: DC | PRN
  Filled 2021-12-30: qty 10, 30d supply, fill #0

## 2021-12-30 MED ORDER — SACUBITRIL-VALSARTAN 24-26 MG PO TABS
1.0000 | ORAL_TABLET | Freq: Two times a day (BID) | ORAL | 4 refills | Status: DC
  Filled 2021-12-30 (×2): qty 60, 30d supply, fill #0

## 2021-12-30 NOTE — Assessment & Plan Note (Signed)
Last chest x-ray clear no pneumonia seen bronchitis resolved

## 2021-12-30 NOTE — Assessment & Plan Note (Signed)
Patient safe to have Viagra refilled at this time prescription issued

## 2021-12-30 NOTE — Assessment & Plan Note (Signed)
Patient taken Wilder Glade and beta-blocker needs to resume Entresto prescription sent to pharmacy

## 2021-12-30 NOTE — Patient Instructions (Signed)
Call cardiology at [phone] to get your appointment with Dr. Quentin Ore for your heart disease  Refill on Viagra sent to our pharmacy downstairs and also topical Penlac ointment to apply to the toenails involved with toenail fungus daily  Entresto was also refilled sent to our pharmacy downstairs this might possibly be mailed to you it is through patient assistance  No other medication changes  Pick up a fecal occult kit on the way out for colon cancer screening  Flu vaccine was given  Return to see Dr. Joya Gaskins 3 months  Continue to work on reducing tobacco intake

## 2021-12-30 NOTE — Assessment & Plan Note (Signed)
Uncontrolled type 2 diabetes continue insulin long-acting along with Farxiga metformin  Follow-up short-term

## 2021-12-30 NOTE — Progress Notes (Signed)
Established Patient Office Visit  Subjective:  Patient ID: James [name]., male    DOB: [birth year]  Age: 61 y.o. MRN: [id number]  CC:  Chief Complaint  Patient presents with   Diabetes    HPI  04/23/21 James [name]. presents for complaint of ankle swelling. He states that he first noticed it on 04/21/21. He states that it has been painful. He states he has no other symptoms.  In December 2022, he was seen in the ED for pneumonia of both lungs, and treated with antibiotics. He states that he has been feeling much better since then, and that he is not coughing or experiencing any problems with his breathing right now.  The patient also presented to the ED after a drug overdose. He was treated with Narcan and was discharged home with stable vitals and labs. He states that this was disappointing to him, as he had not been using any illicit substances for 18 months prior to this incident. He states that he has not used any substances since December 24, and he does not want to use anymore. He reports that his mood is good at this time.  This patient has a history of Type II diabetes. He states that he measures his sugars once a day, in the morning, and states that his values have been high recently. Per the patient, they have been in 360-380 range. He thinks this may be due to inconsistent medication adherence in December when he was using drugs. Since December 24, he states he has been taking all of his diabetes medications as prescribed. He states that this morning's value was 96.  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 He is followed by cardiology (Dr. 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 He states that he is taking  his prescribed medications.   9/7 This patient seen in return follow-up has not been seen since January of this year.  He was in a 90-day cocaine rehab treatment center and has  been out for 30 days thus he has been sober for 120 days.  He has had a increased cough for the last 2 weeks productive of green mucus.  He has no other referable symptoms to COVID.  Blood sugars have been in the 100-140 range and he does smoke a pack of cigarettes a week.  He has been off some of his heart failure medications.  He needs to get back in with cardiology as well.  9/20   This patient is seen in short-term follow-up at last visit had acute bronchitis he received a course of azithromycin the cough is now resolved.  He states his blood sugars at home are improved anywhere from 120-135 the lowest of 97.  He was called by cardiology for an appointment but he is just to follow-up on this.  Patient would like a refill on his Viagra.  He has no other complaints.  He remains abstinent from cocaine.  He is taking all his other medications except he has not had the Entresto filled as of yet.   Past Medical History:  Diagnosis Date   Cellulitis and abscess of foot 44/96/7591   Chronic systolic heart failure (HCC)    Cocaine abuse (HCC)    Cocaine dependence with cocaine-induced mood disorder ([place])    Depression    Diabetes mellitus without complication ([place])    Essential hypertension    Multiple closed fractures of ribs of  both sides    NICM (nonischemic cardiomyopathy) ([place]) EF 20-25%    Pneumonia of both lungs due to infectious organism 04/26/2016   Suicidal ideations 07/12/2017    Past Surgical History:  Procedure Laterality Date   CARDIAC CATHETERIZATION N/A 04/30/2016   Procedure: Right/Left Heart Cath and Coronary Angiography;  Surgeon: Nelva Bush, MD;  Location: [facility];  Service: Cardiovascular;  Laterality: N/A;   FINGER SURGERY     ICD IMPLANT N/A 12/16/2020   Procedure: ICD IMPLANT;  Surgeon: Vickie Epley, MD;  Location: [facility];  Service: Cardiovascular;  Laterality: N/A;   LEFT HEART CATH AND CORONARY ANGIOGRAPHY N/A 12/12/2020   Procedure: LEFT  HEART CATH AND CORONARY ANGIOGRAPHY;  Surgeon: Troy Sine, MD;  Location: [facility];  Service: Cardiovascular;  Laterality: N/A;    Family History  Problem Relation Age of Onset   Diabetes Other    Hypertension Other    Diabetes Mother    Brain cancer Father    Mental illness Brother    Drug abuse Brother    Diabetes Brother     Social History   Socioeconomic History   Marital status: Married    Spouse name: Not on file   Number of children: Not on file   Years of education: Not on file   Highest education level: Not on file  Occupational History   Not on file  Tobacco Use   Smoking status: Former    Packs/day: 0.50    Years: 23.00    Total pack years: 11.50    Types: Cigarettes    Quit date: 12/16/2021    Years since quitting: 0.0   Smokeless tobacco: Never   Tobacco comments:    Every other day   Vaping Use   Vaping Use: Never used  Substance and Sexual Activity   Alcohol use: Yes   Drug use: Not Currently    Types: Cocaine, "Crack" cocaine   Sexual activity: Not Currently  Other Topics Concern   Not on file  Social History Narrative   Not on file   Social Determinants of Health   Financial Resource Strain: Not on file  Food Insecurity: Not on file  Transportation Needs: Not on file  Physical Activity: Not on file  Stress: Not on file  Social Connections: Not on file  Intimate Partner Violence: Not on file    Outpatient Medications Prior to Visit  Medication Sig Dispense Refill   aspirin 81 MG chewable tablet Chew 81 mg by mouth daily. 1Daily     atorvastatin (LIPITOR) 40 MG tablet Take 1 tablet (40 mg total) by mouth daily. 90 tablet 1   carvedilol (COREG) 25 MG tablet Take 1 tablet (25 mg total) by mouth 2 (two) times daily with a meal. 60 tablet 6   cholecalciferol (VITAMIN D3) 25 MCG (1000 UNIT) tablet Take 1 tablet (1,000 Units total) by mouth daily. 60 tablet 1   dapagliflozin propanediol (FARXIGA) 10 MG TABS tablet Take 1 tablet (10  mg total) by mouth daily before breakfast. 30 tablet 2   glucose blood test strip USE TO CHECK BLOOD SUGARS 2 TIMES DAILY 100 strip 12   Insulin Glargine (BASAGLAR KWIKPEN) 100 UNIT/ML Inject 14 Units into the skin daily. 15 mL 3   metFORMIN (GLUCOPHAGE) 1000 MG tablet Take 1 tablet (1,000 mg total) by mouth 2 (two) times daily with a meal. 120 tablet 2   spironolactone (ALDACTONE) 25 MG tablet Take 0.5 tablets (  12.5 mg total) by mouth daily. 30 tablet 4   blood glucose meter kit and supplies Dispense based on patient and insurance preference. Use up to four times daily as directed. (FOR ICD-9 250.00, 250.01). (Patient not taking: Reported on 12/30/2021) 1 each 0   Blood Glucose Monitoring Suppl (TRUE METRIX METER) w/Device KIT Use to measure blood sugar twice a day (Patient not taking: Reported on 12/30/2021) 1 kit 0   Insulin Pen Needle (TRUEPLUS 5-BEVEL PEN NEEDLES) 32G X 4 MM MISC Use to inject Basaglar once a day. (Patient not taking: Reported on 12/30/2021) 100 each 1   TRUEplus Lancets 28G MISC Use to measure blood sugar twice a day (Patient not taking: Reported on 12/30/2021) 100 each 1   azithromycin (ZITHROMAX) 250 MG tablet Take 2 tablets by mouth on day 1, then 1 tablet by mouth daily until gone. (Patient not taking: Reported on 12/30/2021) 6 tablet 0   sacubitril-valsartan (ENTRESTO) 24-26 MG Take 1 tablet by mouth 2 (two) times daily. (Patient not taking: Reported on 12/30/2021) 60 tablet 4   No facility-administered medications prior to visit.    No Known Allergies  ROS Review of Systems  Constitutional: Negative.   HENT: Negative.  Negative for ear pain, postnasal drip, rhinorrhea, sinus pressure, sore throat, trouble swallowing and voice change.   Eyes: Negative.   Respiratory: Negative.  Negative for apnea, cough, choking, chest tightness, shortness of breath, wheezing and stridor.   Cardiovascular: Negative.  Negative for chest pain, palpitations and leg swelling.   Gastrointestinal: Negative.  Negative for abdominal distention, abdominal pain, nausea and vomiting.  Endocrine: Negative.   Genitourinary: Negative.   Musculoskeletal: Negative.  Negative for arthralgias and myalgias.  Skin: Negative.  Negative for rash.  Allergic/Immunologic: Negative.  Negative for environmental allergies and food allergies.  Neurological: Negative.  Negative for dizziness, syncope, weakness and headaches.  Hematological: Negative.  Negative for adenopathy. Does not bruise/bleed easily.  Psychiatric/Behavioral: Negative.  Negative for agitation, dysphoric mood, sleep disturbance and suicidal ideas. The patient is not nervous/anxious.       Objective:    Physical Exam Vitals reviewed.  Constitutional:      General: He is not in acute distress.    Appearance: Normal appearance. He is well-developed. He is not diaphoretic.  HENT:     Head: Normocephalic and atraumatic.     Nose: No nasal deformity, septal deviation, mucosal edema or rhinorrhea.     Right Sinus: No maxillary sinus tenderness or frontal sinus tenderness.     Left Sinus: No maxillary sinus tenderness or frontal sinus tenderness.     Mouth/Throat:     Pharynx: No oropharyngeal exudate.  Eyes:     General: No scleral icterus.    Conjunctiva/sclera: Conjunctivae normal.     Pupils: Pupils are equal, round, and reactive to light.  Neck:     Thyroid: No thyromegaly.     Vascular: No carotid bruit or JVD.     Trachea: Trachea normal. No tracheal tenderness or tracheal deviation.  Cardiovascular:     Rate and Rhythm: Normal rate and regular rhythm.     Chest Wall: PMI is not displaced.     Pulses: Normal pulses. No decreased pulses.     Heart sounds: S1 normal and S2 normal. Heart sounds not distant. Murmur heard.     Systolic (holosystolic, best heard at left lower sternal border) murmur is present.     No diastolic murmur is present.     No friction rub.  No gallop. No S3 or S4 sounds.  Pulmonary:      Effort: Pulmonary effort is normal. No tachypnea, accessory muscle usage or respiratory distress.     Breath sounds: Normal breath sounds. No stridor. No decreased breath sounds, wheezing, rhonchi or rales.  Chest:     Chest wall: No tenderness.  Abdominal:     General: Bowel sounds are normal. There is no distension.     Palpations: Abdomen is soft. Abdomen is not rigid.     Tenderness: There is no abdominal tenderness. There is no guarding or rebound.  Musculoskeletal:        General: Normal range of motion.     Cervical back: Normal range of motion and neck supple. No edema, erythema or rigidity. No muscular tenderness. Normal range of motion.     Right lower leg: No edema.     Left lower leg: No edema.  Lymphadenopathy:     Head:     Right side of head: No submental or submandibular adenopathy.     Left side of head: No submental or submandibular adenopathy.     Cervical: No cervical adenopathy.  Skin:    General: Skin is warm and dry.     Coloration: Skin is not pale.     Findings: No rash.     Nails: There is no clubbing.     Comments: Bilateral onychomycosis both great toenails  Neurological:     General: No focal deficit present.     Mental Status: He is alert and oriented to person, place, and time.     Sensory: No sensory deficit.  Psychiatric:        Mood and Affect: Mood normal.        Speech: Speech normal.        Behavior: Behavior normal.     BP 133/79   Pulse 90   Ht 6' (1.829 m)   Wt 158 lb 9.6 oz (71.9 kg)   SpO2 98%   BMI 21.51 kg/m  Wt Readings from Last 3 Encounters:  12/30/21 158 lb 9.6 oz (71.9 kg)  12/17/21 156 lb 6.4 oz (70.9 kg)  04/23/21 146 lb (66.2 kg)   CXR 9/8:  NORMAL  Health Maintenance Due  Topic Date Due   OPHTHALMOLOGY EXAM  Never done   COLON CANCER SCREENING ANNUAL FOBT  12/25/2020    There are no preventive care reminders to display for this patient.  Lab Results  Component Value Date   TSH 2.155 01/16/2017   Lab  Results  Component Value Date   WBC 3.9 12/17/2021   HGB 12.9 (L) 12/17/2021   HCT 39.1 12/17/2021   MCV 84 12/17/2021   PLT 241 12/17/2021   Lab Results  Component Value Date   NA 141 12/17/2021   K 4.5 12/17/2021   CO2 20 12/17/2021   GLUCOSE 194 (H) 12/17/2021   BUN 20 12/17/2021   CREATININE 0.93 12/17/2021   BILITOT <0.2 12/17/2021   ALKPHOS 88 12/17/2021   AST 16 12/17/2021   ALT 23 12/17/2021   PROT 7.0 12/17/2021   ALBUMIN 4.5 12/17/2021   CALCIUM 9.9 12/17/2021   ANIONGAP 9 06/18/2021   EGFR 92 12/17/2021   Lab Results  Component Value Date   CHOL 182 12/17/2021   Lab Results  Component Value Date   HDL 69 12/17/2021   Lab Results  Component Value Date   LDLCALC 99 12/17/2021   Lab Results  Component Value Date   TRIG  73 12/17/2021   Lab Results  Component Value Date   CHOLHDL 2.6 12/17/2021   Lab Results  Component Value Date   HGBA1C 12.1 (H) 12/17/2021      Assessment & Plan:   Problem List Items Addressed This Visit       Cardiovascular and Mediastinum   Essential hypertension    Blood pressure approaching goal now 133/79 ideally would like to get him less than 130/80  Patient needs to resume Entresto sent prescription to pharmacy no other medication changes      Relevant Medications   sacubitril-valsartan (ENTRESTO) 24-26 MG   sildenafil (VIAGRA) 50 MG tablet   Chronic combined systolic and diastolic heart failure ([place])    Patient taken [place] and beta-blocker needs to resume Entresto prescription sent to pharmacy      Relevant Medications   sacubitril-valsartan (ENTRESTO) 24-26 MG   sildenafil (VIAGRA) 50 MG tablet     Respiratory   RESOLVED: Pneumonia of both lungs due to infectious organism    Last chest x-ray clear no pneumonia seen bronchitis resolved      Relevant Medications   ciclopirox (PENLAC) 8 % solution     Endocrine   Uncontrolled type 2 diabetes mellitus with hyperglycemia (HCC) - Primary     Uncontrolled type 2 diabetes continue insulin long-acting along with Farxiga metformin  Follow-up short-term      Relevant Orders   POCT glucose (manual entry) (Completed)     Musculoskeletal and Integument   Onychomycosis    Bilateral onychomycosis both great toes will prescribe topical ciclopirox      Relevant Medications   ciclopirox (PENLAC) 8 % solution     Other   HYPERCHOLESTEROLEMIA    Continue with statin      Relevant Medications   sacubitril-valsartan (ENTRESTO) 24-26 MG   sildenafil (VIAGRA) 50 MG tablet   History of cocaine abuse ([place])    Remains abstinent at this time      Vasculogenic erectile dysfunction    Patient safe to have Viagra refilled at this time prescription issued      Other Visit Diagnoses     Nonischemic cardiomyopathy ([place])       Relevant Medications   sacubitril-valsartan (ENTRESTO) 24-26 MG   sildenafil (VIAGRA) 50 MG tablet   Colon cancer screening       Relevant Orders   Fecal occult blood, imunochemical   Need for immunization against influenza       Relevant Orders   Flu Vaccine QUAD 7moIM (Fluarix, Fluzone & Alfiuria Quad PF) (Completed)      Meds ordered this encounter  Medications   sacubitril-valsartan (ENTRESTO) 24-26 MG    Sig: Take 1 tablet by mouth 2 (two) times daily.    Dispense:  60 tablet    Refill:  4    PASS, uninsured can we dispense today or mail to patient   sildenafil (VIAGRA) 50 MG tablet    Sig: Take 1 tablet (50 mg total) by mouth daily as needed for erectile dysfunction.    Dispense:  10 tablet    Refill:  0   ciclopirox (PENLAC) 8 % solution    Sig: Apply topically at bedtime. Apply over nail and surrounding skin. Apply daily over previous coat. After seven (7) days, may remove with alcohol and continue cycle.    Dispense:  6.6 mL    Refill:  0    May substitute  Has to pick up a fecal occult kit for colon cancer screening  Follow-up: Return in about 3 months (around 03/31/2022) for htn,  diabetes, heart failure.    Asencion Noble, MD

## 2021-12-30 NOTE — Assessment & Plan Note (Signed)
Bilateral onychomycosis both great toes will prescribe topical ciclopirox

## 2021-12-30 NOTE — Assessment & Plan Note (Signed)
Continue with statin. 

## 2021-12-30 NOTE — Assessment & Plan Note (Signed)
Remains abstinent at this time

## 2021-12-30 NOTE — Assessment & Plan Note (Signed)
Blood pressure approaching goal now 133/79 ideally would like to get him less than 130/80  Patient needs to resume Entresto sent prescription to pharmacy no other medication changes

## 2022-01-01 ENCOUNTER — Other Ambulatory Visit: Payer: Self-pay

## 2022-01-02 NOTE — Progress Notes (Signed)
Remote ICD transmission.   

## 2022-01-06 ENCOUNTER — Other Ambulatory Visit: Payer: Self-pay

## 2022-01-06 ENCOUNTER — Telehealth: Payer: Self-pay

## 2022-01-06 NOTE — Telephone Encounter (Signed)
Entresto-application was submitted yesterday to [employer] PAP without proof of income.  Spoke to pt yesterday via phone and he acknowledged the need for proof of income for PAP approval.  Pt states that he has social security income and did not file taxes.  States that [employer] has his benefits letter.  I informed pt that I do not have access to [employer]'s documents and he would need to provide a copy.  [employer] states that they need IRS forms.  Yesterday pt asked that I call him back today before 11am.  1st attempt today he answered and I introduced myself then he hung up-9am.  2nd attempt I had to leave a message-left my number and [employer]' number.  Currently, Entresto PAP status is incomplete.

## 2022-01-06 NOTE — Telephone Encounter (Signed)
Faxed Social Security benefits letter to [employer] today.

## 2022-01-06 NOTE — Telephone Encounter (Signed)
Call the pt and emphasize the importance for the f/u of the PA for entresto, matter of life or death

## 2022-01-08 ENCOUNTER — Other Ambulatory Visit: Payer: Self-pay | Admitting: Pharmacist

## 2022-01-08 ENCOUNTER — Other Ambulatory Visit: Payer: Self-pay

## 2022-01-08 DIAGNOSIS — I428 Other cardiomyopathies: Secondary | ICD-10-CM

## 2022-01-08 MED ORDER — SACUBITRIL-VALSARTAN 24-26 MG PO TABS: 1.0000 | ORAL_TABLET | Freq: Two times a day (BID) | ORAL | 3 refills | Status: DC

## 2022-01-08 MED ORDER — SACUBITRIL-VALSARTAN 24-26 MG PO TABS
1.0000 | ORAL_TABLET | Freq: Two times a day (BID) | ORAL | 3 refills | Status: DC
  Filled 2022-01-08: qty 180, 90d supply, fill #0

## 2022-01-08 NOTE — Telephone Encounter (Signed)
ENROLLED IN [employer] PAP UNTIL 01/07/2023-[employer] SHIP TO PT HOME

## 2022-01-11 ENCOUNTER — Other Ambulatory Visit: Payer: Self-pay

## 2022-01-11 ENCOUNTER — Telehealth: Payer: Self-pay

## 2022-01-11 NOTE — Telephone Encounter (Signed)
Called patient to follow up and he stated that he was able to get the forms and was approved

## 2022-01-11 NOTE — Telephone Encounter (Signed)
Patient needs to set up shipment for free Entresto with [employer] patient assistance foundation.  [employer] will reach out to patient, but [employer]' # left on patient's voicemail just in case. [phone]

## 2022-01-12 ENCOUNTER — Other Ambulatory Visit: Payer: Self-pay

## 2022-02-09 ENCOUNTER — Other Ambulatory Visit: Payer: Self-pay | Admitting: Critical Care Medicine

## 2022-02-09 ENCOUNTER — Other Ambulatory Visit: Payer: Self-pay

## 2022-02-09 MED ORDER — SILDENAFIL CITRATE 50 MG PO TABS
50.0000 mg | ORAL_TABLET | Freq: Every day | ORAL | 1 refills | Status: DC | PRN
Start: 2022-02-09 — End: 2022-06-10
  Filled 2022-02-09: qty 10, 30d supply, fill #0

## 2022-02-12 ENCOUNTER — Other Ambulatory Visit: Payer: Self-pay

## 2022-03-09 NOTE — Progress Notes (Unsigned)
Electrophysiology Office Follow up Visit Note:    Date:  03/10/2022   ID:  James [name]., DOB 11-Apr-1961, MRN [id number]  PCP:  Elsie Stain, MD  [facility] Cardiologist:  Kirk Ruths, MD  [facility] Electrophysiologist:  Vickie Epley, MD    Interval History:    James [name]. is a 61 y.o. male who presents for a follow up visit. They were last seen in clinic 03/18/2021. He has a ICD for hx of VT/VF arrest. Device is functioning well.   He is with family today in clinic.  He has been drug free for about 7 months. He is working on reducing his cigarette use. He is feeling much better. Also working on his diabetes control.     Past Medical History:  Diagnosis Date   Cellulitis and abscess of foot 62/94/7654   Chronic systolic heart failure (HCC)    Cocaine abuse (HCC)    Cocaine dependence with cocaine-induced mood disorder ([place])    Depression    Diabetes mellitus without complication ([place])    Essential hypertension    Multiple closed fractures of ribs of both sides    NICM (nonischemic cardiomyopathy) ([place]) EF 20-25%    Pneumonia of both lungs due to infectious organism 04/26/2016   Suicidal ideations 07/12/2017    Past Surgical History:  Procedure Laterality Date   CARDIAC CATHETERIZATION N/A 04/30/2016   Procedure: Right/Left Heart Cath and Coronary Angiography;  Surgeon: Nelva Bush, MD;  Location: [place] [facility];  Service: Cardiovascular;  Laterality: N/A;   FINGER SURGERY     ICD IMPLANT N/A 12/16/2020   Procedure: ICD IMPLANT;  Surgeon: Vickie Epley, MD;  Location: [place] [facility];  Service: Cardiovascular;  Laterality: N/A;   LEFT HEART CATH AND CORONARY ANGIOGRAPHY N/A 12/12/2020   Procedure: LEFT HEART CATH AND CORONARY ANGIOGRAPHY;  Surgeon: Troy Sine, MD;  Location: [place] [facility];  Service: Cardiovascular;  Laterality: N/A;    Current Medications: Current Meds  Medication Sig   aspirin 81 MG chewable  tablet Chew 81 mg by mouth daily. 1Daily   atorvastatin (LIPITOR) 40 MG tablet Take 1 tablet (40 mg total) by mouth daily.   blood glucose meter kit and supplies Dispense based on patient and insurance preference. Use up to four times daily as directed. (FOR ICD-9 250.00, 250.01).   Blood Glucose Monitoring Suppl (TRUE METRIX METER) w/Device KIT Use to measure blood sugar twice a day   carvedilol (COREG) 25 MG tablet Take 1 tablet (25 mg total) by mouth 2 (two) times daily with a meal.   cholecalciferol (VITAMIN D3) 25 MCG (1000 UNIT) tablet Take 1 tablet (1,000 Units total) by mouth daily.   ciclopirox (PENLAC) 8 % solution Apply topically at bedtime. Apply over nail and surrounding skin. Apply daily over previous coat. After seven (7) days, may remove with alcohol and continue cycle.   dapagliflozin propanediol (FARXIGA) 10 MG TABS tablet Take 1 tablet (10 mg total) by mouth daily before breakfast.   glucose blood test strip USE TO CHECK BLOOD SUGARS 2 TIMES DAILY   Insulin Glargine (BASAGLAR KWIKPEN) 100 UNIT/ML Inject 14 Units into the skin daily.   Insulin Pen Needle (TRUEPLUS 5-BEVEL PEN NEEDLES) 32G X 4 MM MISC Use to inject Basaglar once a day.   sacubitril-valsartan (ENTRESTO) 24-26 MG Take 1 tablet by mouth 2 (two) times daily.   sildenafil (VIAGRA) 50 MG tablet Take 1 tablet (50 mg total) by mouth  daily as needed for erectile dysfunction.   spironolactone (ALDACTONE) 25 MG tablet Take 0.5 tablets (12.5 mg total) by mouth daily.   TRUEplus Lancets 28G MISC Use to measure blood sugar twice a day     Allergies:   Patient has no known allergies.   Social History   Socioeconomic History   Marital status: Married    Spouse name: Not on file   Number of children: Not on file   Years of education: Not on file   Highest education level: Not on file  Occupational History   Not on file  Tobacco Use   Smoking status: Former    Packs/day: 0.50    Years: 23.00    Total pack years: 11.50     Types: Cigarettes    Quit date: 12/16/2021    Years since quitting: 0.2   Smokeless tobacco: Never   Tobacco comments:    Every other day   Vaping Use   Vaping Use: Never used  Substance and Sexual Activity   Alcohol use: Yes   Drug use: Not Currently    Types: Cocaine, "Crack" cocaine   Sexual activity: Not Currently  Other Topics Concern   Not on file  Social History Narrative   Not on file   Social Determinants of Health   Financial Resource Strain: Not on file  Food Insecurity: Not on file  Transportation Needs: Not on file  Physical Activity: Not on file  Stress: Not on file  Social Connections: Not on file     Family History: The patient's family history includes Brain cancer in his father; Diabetes in his brother, mother, and another family member; Drug abuse in his brother; Hypertension in an other family member; Mental illness in his brother.  ROS:   Please see the history of present illness.    All other systems reviewed and are negative.  EKGs/Labs/Other Studies Reviewed:    The following studies were reviewed today:   03/10/2022 in clinic device interrogation personally reviewed Battery and lead parameters stable VS 100% No HV therapies   Recent Labs: 04/09/2021: B Natriuretic Peptide 84.0 06/18/2021: Magnesium 1.7 12/17/2021: ALT 23; BUN 20; Creatinine, Ser 0.93; Hemoglobin 12.9; Platelets 241; Potassium 4.5; Sodium 141  Recent Lipid Panel    Component Value Date/Time   CHOL 182 12/17/2021 1025   TRIG 73 12/17/2021 1025   HDL 69 12/17/2021 1025   CHOLHDL 2.6 12/17/2021 1025   CHOLHDL 1.5 12/12/2020 0029   VLDL 7 12/12/2020 0029   LDLCALC 99 12/17/2021 1025    Physical Exam:    VS:  BP (!) 112/58   Pulse 89   Ht 6' (1.829 m)   Wt 159 lb (72.1 kg)   SpO2 98%   BMI 21.56 kg/m     Wt Readings from Last 3 Encounters:  03/10/22 159 lb (72.1 kg)  12/30/21 158 lb 9.6 oz (71.9 kg)  12/17/21 156 lb 6.4 oz (70.9 kg)     GEN:  Well  nourished, well developed in no acute distress HEENT: Normal NECK: No JVD; No carotid bruits LYMPHATICS: No lymphadenopathy CARDIAC: RRR, no murmurs, rubs, gallops. ICD pocket well healed. RESPIRATORY:  Clear to auscultation without rales, wheezing or rhonchi  ABDOMEN: Soft, non-tender, non-distended MUSCULOSKELETAL:  No edema; No deformity  SKIN: Warm and dry NEUROLOGIC:  Alert and oriented x 3 PSYCHIATRIC:  Normal affect        ASSESSMENT:    1. Chronic combined systolic and diastolic heart failure ([place])  2. ICD (implantable cardioverter-defibrillator) in place    PLAN:    In order of problems listed above:  #Chronic combined systolic/diastolic HF NYHA II today. Warm and dry on exam. Last EF 32% on cMR 12/15/2020 - repeat echo   #VT/VF #ICD in situ Device functioning appropriately. Continue remote monitoring.   Follow up 1 year w APP.  Medication Adjustments/Labs and Tests Ordered: Current medicines are reviewed at length with the patient today.  Concerns regarding medicines are outlined above.  Orders Placed This Encounter  Procedures   CUP [place]   No orders of the defined types were placed in this encounter.    Signed, Lars Mage, MD, [facility], [facility] 03/10/2022 11:45 PM    Electrophysiology [facility]

## 2022-03-10 ENCOUNTER — Encounter: Payer: Self-pay | Admitting: Cardiology

## 2022-03-10 ENCOUNTER — Ambulatory Visit: Payer: Self-pay | Attending: Cardiology | Admitting: Cardiology

## 2022-03-10 VITALS — BP 112/58 | HR 89 | Ht 72.0 in | Wt 159.0 lb

## 2022-03-10 DIAGNOSIS — Z95810 Presence of automatic (implantable) cardiac defibrillator: Secondary | ICD-10-CM

## 2022-03-10 DIAGNOSIS — I5042 Chronic combined systolic (congestive) and diastolic (congestive) heart failure: Secondary | ICD-10-CM

## 2022-03-10 LAB — CUP PACEART INCLINIC DEVICE CHECK
Battery Remaining Longevity: 131 mo
Battery Voltage: 3.01 V
Brady Statistic RV Percent Paced: 0 %
Date Time Interrogation Session: 20231129161918
HighPow Impedance: 68 Ohm
Implantable Lead Connection Status: 753985
Implantable Lead Implant Date: 20220906
Implantable Lead Location: 753860
Implantable Pulse Generator Implant Date: 20220906
Lead Channel Impedance Value: 228 Ohm
Lead Channel Impedance Value: 304 Ohm
Lead Channel Pacing Threshold Amplitude: 0.625 V
Lead Channel Pacing Threshold Pulse Width: 0.4 ms
Lead Channel Sensing Intrinsic Amplitude: 11.125 mV
Lead Channel Sensing Intrinsic Amplitude: 9.375 mV
Lead Channel Setting Pacing Amplitude: 2 V
Lead Channel Setting Pacing Pulse Width: 0.4 ms
Lead Channel Setting Sensing Sensitivity: 0.3 mV
Zone Setting Status: 755011
Zone Setting Status: 755011

## 2022-03-11 ENCOUNTER — Telehealth: Payer: Self-pay | Admitting: Cardiology

## 2022-03-11 NOTE — Telephone Encounter (Signed)
Called patient to schedule echocardiogram that Dr. Lalla Brothers ordered yesterday. Patient would like a nurse to call him back to explain to him the reason for this ultrasound.

## 2022-03-11 NOTE — Addendum Note (Signed)
Addended by: Frutoso Schatz on: 03/11/2022 07:28 AM   Modules accepted: Orders

## 2022-03-11 NOTE — Telephone Encounter (Signed)
Called patient back about his message. Informed patient that Dr. Lalla Brothers order echo to check his EF due to him having CHF. Patient agreed to have test done, but would like to wait until February. Will send message to to scheduler.

## 2022-03-15 ENCOUNTER — Other Ambulatory Visit: Payer: Self-pay

## 2022-03-18 ENCOUNTER — Ambulatory Visit: Payer: Self-pay

## 2022-03-31 ENCOUNTER — Ambulatory Visit: Payer: Self-pay | Admitting: Critical Care Medicine

## 2022-04-06 ENCOUNTER — Other Ambulatory Visit: Payer: Self-pay

## 2022-04-07 ENCOUNTER — Other Ambulatory Visit: Payer: Self-pay

## 2022-04-09 ENCOUNTER — Other Ambulatory Visit: Payer: Self-pay

## 2022-04-30 ENCOUNTER — Other Ambulatory Visit: Payer: Self-pay

## 2022-05-06 ENCOUNTER — Ambulatory Visit: Payer: Self-pay | Admitting: Family Medicine

## 2022-05-14 ENCOUNTER — Telehealth: Payer: Self-pay

## 2022-05-14 NOTE — Telephone Encounter (Signed)
   Pre-operative Risk Assessment    Patient Name: James [name].  DOB: 12-24-1960 MRN: [id number]     Request for Surgical Clearance    Procedure:  Dental Extraction - Amount of Teeth to be Pulled:  17 teeth extract & Place immediate dentures  Date of Surgery:  Clearance TBD                                 Surgeon:  Dr. Everlean Cherry  Surgeon's Group or Practice Name:  [facility] Phone number:  [phone] Fax number:  [phone]   Type of Clearance Requested:   - Medical  - Pharmacy:  Hold Aspirin     Type of Anesthesia:  Local    Additional requests/questions:    SignedJacqulynn Cadet   05/14/2022, 4:07 PM

## 2022-05-17 NOTE — Telephone Encounter (Signed)
Per [place] CMA left message to call back to schedule tele pre op appt.

## 2022-05-17 NOTE — Telephone Encounter (Signed)
   Name: James [name].  DOB: 11-15-1960  MRN: [id number]  Primary Cardiologist: Kirk Ruths, MD   Preoperative team, please contact this patient and set up a phone call appointment for further preoperative risk assessment. Please obtain consent and complete medication review. Thank you for your help.  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 No history of PCI. Therefore, should be OK to hold Aspirin for 17 dental extractions if needed. Of note, it does look like patient has an Echo scheduled for 06/01/2022 to re-evaluate LV function given history of non-ischemic cardiomyopathy. As long as he is not having any acute CHF symptoms, I don't think we necessarily need to wait on Echo prior to low risk dental procedure which will be done under local anesthesia.     Darreld Mclean, PA-C 05/17/2022, 9:04 AM [place]

## 2022-05-18 NOTE — Telephone Encounter (Signed)
Left message on patients voicemail requesting he contact the office.    Called his spouse, Kristjan Derner, I requested that she have the patient contact our office. She states she does not know how to get in touch with him but if she sees him she will have him contact the office.   Spoke with the patients daughter who states she will reach out to her dad and have him give our office a call.

## 2022-05-20 ENCOUNTER — Telehealth: Payer: Self-pay | Admitting: *Deleted

## 2022-05-20 NOTE — Telephone Encounter (Signed)
Pt has been scheduled for tele pre op appt 05/27/22 @ 3 pm. 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 I told the pt that it was more important for him to be safe while he is in the middle of traffic during the construction work going on and that we will go over his medications during the tele pre op appt. Pt thanked me for the help. Consent has been given though.

## 2022-05-20 NOTE — Telephone Encounter (Signed)
Pt has been scheduled for tele pre op appt 05/27/22 @ 3 pm. 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 I told the pt that it was more important for him to be safe while he is in the middle of traffic during the construction work going on and that we will go over his medications during the tele pre op appt. Pt thanked me for the help. Consent has been given though.     Patient Consent for Virtual Visit        James [name]. has provided verbal consent on 05/20/2022 for a virtual visit (video or telephone).   CONSENT FOR VIRTUAL VISIT FOR:  James [name].  By participating in this virtual visit I agree to the following:  I hereby voluntarily request, consent and authorize [place] and its employed or contracted physicians, physician assistants, nurse practitioners or other licensed health care professionals (the Practitioner), to provide me with telemedicine health care services (the "Services") as deemed necessary by the treating Practitioner. I acknowledge and consent to receive the Services by the Practitioner via telemedicine. I understand that the telemedicine visit will involve communicating with the Practitioner through live audiovisual communication technology and the disclosure of certain medical information by electronic transmission. I acknowledge that I have been given the opportunity to request an in-person assessment or other available alternative prior to the telemedicine visit and am voluntarily participating in the telemedicine visit.  I understand that I have the right to withhold or withdraw my consent to the use of telemedicine in the course of my care at any time, without affecting my right to future care or treatment, and that the Practitioner or I may terminate the telemedicine visit at any time. I understand that I have the right to inspect all information obtained and/or recorded in the course of the telemedicine  visit and may receive copies of available information for a reasonable fee.  I understand that some of the potential risks of receiving the Services via telemedicine include:  Delay or interruption in medical evaluation due to technological equipment failure or disruption; Information transmitted may not be sufficient (e.g. poor resolution of images) to allow for appropriate medical decision making by the Practitioner; and/or  In rare instances, security protocols could fail, causing a breach of personal health information.  Furthermore, I acknowledge that it is my responsibility to provide information about my medical history, conditions and care that is complete and accurate to the best of my ability. I acknowledge that Practitioner's advice, recommendations, and/or decision may be based on factors not within their control, such as incomplete or inaccurate data provided by me or distortions of diagnostic images or specimens that may result from electronic transmissions. I understand that the practice of medicine is not an exact science and that Practitioner makes no warranties or guarantees regarding treatment outcomes. I acknowledge that a copy of this consent can be made available to me via my patient portal ([place]), or I can request a printed copy by calling the office of [facility].    I understand that my insurance will be billed for this visit.   I have read or had this consent read to me. I understand the contents of this consent, which adequately explains the benefits and risks of the Services being provided via telemedicine.  I have been provided ample opportunity to ask questions regarding this consent and the Services and have had  my questions answered to my satisfaction. I give my informed consent for the services to be provided through the use of telemedicine in my medical care   3

## 2022-05-27 ENCOUNTER — Ambulatory Visit: Payer: Medicaid Other | Attending: Cardiology | Admitting: Physician Assistant

## 2022-05-27 DIAGNOSIS — Z01810 Encounter for preprocedural cardiovascular examination: Secondary | ICD-10-CM

## 2022-05-27 NOTE — Progress Notes (Signed)
Virtual Visit via Telephone Note   Because of James DELBUONO Jr.'s co-morbid illnesses, he is at least at moderate risk for complications without adequate follow up.  This format is felt to be most appropriate for this patient at this time.  The patient did not have access to video technology/had technical difficulties with video requiring transitioning to audio format only (telephone).  All issues noted in this document were discussed and addressed.  No physical exam could be performed with this format.  Please refer to the patient's chart for his consent to telehealth for [facility].  Evaluation Performed:  Preoperative cardiovascular risk assessment _____________   Date:  05/27/2022   Patient ID:  James Comings., DOB 1960-04-15, MRN [id number] Patient Location:  Home Provider location:   Office  Primary Care Provider:  Elsie Stain, MD Primary Cardiologist:  James Ruths, MD  Chief Complaint / Patient Profile   62 y.o. y/o male with a h/o ICD for VT/VF, chronic combined heart failure, MR, nonobstructive CAD by heart cath 2018, past cocaine use, IDDM, and HTN who is pending 17 teeth extract & Place immediate dentures and presents today for telephonic preoperative cardiovascular risk assessment.  History of Present Illness    James [name]. is a 62 y.o. male who presents via audio/video conferencing for a telehealth visit today.  Pt was last seen in cardiology clinic on 03/10/22 by Dr. Quentin [name].  At that time James Comings. was doing well.  The patient is now pending procedure as outlined above. Since his last visit, he continues to work a physically laborious job and does moderate house work and yard work. He denies angina and S/S of acute CHF.  Past Medical History    Past Medical History:  Diagnosis Date   Cellulitis and abscess of foot [id number]   Chronic systolic heart failure (HCC)    Cocaine abuse ([place])    Cocaine dependence with  cocaine-induced mood disorder ([place])    Depression    Diabetes mellitus without complication ([place])    Essential hypertension    Multiple closed fractures of ribs of both sides    NICM (nonischemic cardiomyopathy) ([place]) EF 20-25%    Pneumonia of both lungs due to infectious organism 04/26/2016   Suicidal ideations 07/12/2017   Past Surgical History:  Procedure Laterality Date   CARDIAC CATHETERIZATION N/A 04/30/2016   Procedure: Right/Left Heart Cath and Coronary Angiography;  Surgeon: Nelva Bush, MD;  Location: [place] [facility];  Service: Cardiovascular;  Laterality: N/A;   FINGER SURGERY     ICD IMPLANT N/A 12/16/2020   Procedure: ICD IMPLANT;  Surgeon: Vickie Epley, MD;  Location: [place] [facility];  Service: Cardiovascular;  Laterality: N/A;   LEFT HEART CATH AND CORONARY ANGIOGRAPHY N/A 12/12/2020   Procedure: LEFT HEART CATH AND CORONARY ANGIOGRAPHY;  Surgeon: Troy Sine, MD;  Location: [place] [facility];  Service: Cardiovascular;  Laterality: N/A;    Allergies  No Known Allergies  Home Medications    Prior to Admission medications   Medication Sig Start Date End Date Taking? Authorizing Provider  aspirin 81 MG chewable tablet Chew 81 mg by mouth daily. 1Daily    [provider]  atorvastatin (LIPITOR) 40 MG tablet Take 1 tablet (40 mg total) by mouth daily. 12/17/21   James Stain, MD  blood glucose meter kit and supplies Dispense based on patient and insurance preference. Use up to four times daily as directed. (  FOR ICD-9 250.00, 250.01). 07/17/19   Mayers, Cari S, PA-C  Blood Glucose Monitoring Suppl (TRUE METRIX METER) w/Device KIT Use to measure blood sugar twice a day 04/24/21   James Stain, MD  carvedilol (COREG) 25 MG tablet Take 1 tablet (25 mg total) by mouth 2 (two) times daily with a meal. 12/17/21   James Stain, MD  cholecalciferol (VITAMIN D3) 25 MCG (1000 UNIT) tablet Take 1 tablet (1,000 Units total) by mouth daily. 12/17/21   James Stain, MD  ciclopirox (PENLAC) 8 % solution Apply topically at bedtime. Apply over nail and surrounding skin. Apply daily over previous coat. After seven (7) days, may remove with alcohol and continue cycle. 12/30/21   James Stain, MD  dapagliflozin propanediol (FARXIGA) 10 MG TABS tablet Take 1 tablet (10 mg total) by mouth daily before breakfast. 12/17/21   James Stain, MD  glucose blood test strip USE TO CHECK BLOOD SUGARS 2 TIMES DAILY 12/17/21   James Stain, MD  Insulin Glargine [facility] KWIKPEN) 100 UNIT/ML Inject 14 Units into the skin daily. 12/17/21   James Stain, MD  Insulin Pen Needle (TRUEPLUS 5-BEVEL PEN NEEDLES) 32G X 4 MM MISC Use to inject Basaglar once a day. 12/17/21   James Stain, MD  metFORMIN (GLUCOPHAGE) 1000 MG tablet Take 1 tablet (1,000 mg total) by mouth 2 (two) times daily with a meal. 12/17/21 02/15/22  James Stain, MD  sacubitril-valsartan (ENTRESTO) 24-26 MG Take 1 tablet by mouth 2 (two) times daily. 01/08/22   James Stain, MD  sildenafil (VIAGRA) 50 MG tablet Take 1 tablet (50 mg total) by mouth daily as needed for erectile dysfunction. 02/09/22   James Stain, MD  spironolactone (ALDACTONE) 25 MG tablet Take 0.5 tablets (12.5 mg total) by mouth daily. 12/17/21   James Stain, MD  TRUEplus Lancets 28G MISC Use to measure blood sugar twice a day 12/17/21   James Stain, MD    Physical Exam    Vital Signs:  James Comings. does not have vital signs available for review today.  Given telephonic nature of communication, physical exam is limited. AAOx3. NAD. Normal affect.  Speech and respirations are unlabored.  Accessory Clinical Findings    None  Assessment & Plan    1.  Preoperative Cardiovascular Risk Assessment:  He has a history of cardiac arrest secondary to Vt/VF with ICD in place (would keep cords at least 6 inches from device). He has nonobstructive CAD, but no prior PCI. He requires insulin for DM but  does not have a history of CKD or CVA. According to the RCRI, he has a 6.6% risk of MACE for any surgery. Tooth extractions will be done under local anesthetic, conferring less risk. He has a scheduled echocardiogram, but does not need to wait for these results. He will have 4 teeth extracted first, then will "work on the bottom." He can complete more than 4.0 METS without angina and denies signs and symptoms of acute heart failure. He understands his risk and wishes to proceed.  Given the patient's comorbid conditions, we prefer to continue ASA throughout the perioperative period. Given that this procedure is considered moderate bleeding risk, surgeon may hold ASA 5 days if deemed necessary for increased bleeding risk.   Given history of VT/VF, would avoid epinephrine if possible.  He does not require SBE PPX from a cardiac standpoint.  Therefore, based on ACC/AHA guidelines, the patient would  be at acceptable risk for the planned procedure without further cardiovascular testing.   The patient was advised that if he develops new symptoms prior to surgery to contact our office to arrange for a follow-up visit, and he verbalized understanding.   A copy of this note will be routed to requesting surgeon.  Time:   Today, I have spent 10 minutes with the patient with telehealth technology discussing medical history, symptoms, and management plan.     Tami Lin Amine Adelson, PA  05/27/2022, 3:23 PM

## 2022-06-01 ENCOUNTER — Other Ambulatory Visit: Payer: Self-pay

## 2022-06-01 ENCOUNTER — Ambulatory Visit (HOSPITAL_COMMUNITY): Payer: Medicaid Other | Attending: Cardiology

## 2022-06-01 DIAGNOSIS — Z95810 Presence of automatic (implantable) cardiac defibrillator: Secondary | ICD-10-CM | POA: Insufficient documentation

## 2022-06-01 DIAGNOSIS — I5042 Chronic combined systolic (congestive) and diastolic (congestive) heart failure: Secondary | ICD-10-CM

## 2022-06-01 LAB — ECHOCARDIOGRAM COMPLETE
Area-P 1/2: 3.78 cm2
Est EF: 55
S' Lateral: 2.9 cm

## 2022-06-09 ENCOUNTER — Other Ambulatory Visit: Payer: Self-pay

## 2022-06-09 ENCOUNTER — Telehealth: Payer: Self-pay

## 2022-06-09 NOTE — Telephone Encounter (Signed)
(  The pharmacy let me know that his )  can u resend his insulin as lantus instead of basaglar? thats what his insurance prefers.

## 2022-06-10 ENCOUNTER — Encounter: Payer: Self-pay | Admitting: Critical Care Medicine

## 2022-06-10 ENCOUNTER — Ambulatory Visit: Payer: Medicaid Other | Attending: Critical Care Medicine | Admitting: Critical Care Medicine

## 2022-06-10 ENCOUNTER — Other Ambulatory Visit: Payer: Self-pay

## 2022-06-10 VITALS — BP 130/65 | HR 87 | Ht 72.0 in | Wt 157.0 lb

## 2022-06-10 DIAGNOSIS — Z760 Encounter for issue of repeat prescription: Secondary | ICD-10-CM | POA: Insufficient documentation

## 2022-06-10 DIAGNOSIS — I5042 Chronic combined systolic (congestive) and diastolic (congestive) heart failure: Secondary | ICD-10-CM | POA: Insufficient documentation

## 2022-06-10 DIAGNOSIS — E1165 Type 2 diabetes mellitus with hyperglycemia: Secondary | ICD-10-CM | POA: Diagnosis not present

## 2022-06-10 DIAGNOSIS — E7800 Pure hypercholesterolemia, unspecified: Secondary | ICD-10-CM | POA: Diagnosis not present

## 2022-06-10 DIAGNOSIS — M25561 Pain in right knee: Secondary | ICD-10-CM | POA: Insufficient documentation

## 2022-06-10 DIAGNOSIS — L84 Corns and callosities: Secondary | ICD-10-CM | POA: Insufficient documentation

## 2022-06-10 DIAGNOSIS — Z7984 Long term (current) use of oral hypoglycemic drugs: Secondary | ICD-10-CM | POA: Diagnosis not present

## 2022-06-10 DIAGNOSIS — I420 Dilated cardiomyopathy: Secondary | ICD-10-CM

## 2022-06-10 DIAGNOSIS — I110 Hypertensive heart disease with heart failure: Secondary | ICD-10-CM | POA: Diagnosis present

## 2022-06-10 DIAGNOSIS — Z95810 Presence of automatic (implantable) cardiac defibrillator: Secondary | ICD-10-CM | POA: Insufficient documentation

## 2022-06-10 DIAGNOSIS — Z1211 Encounter for screening for malignant neoplasm of colon: Secondary | ICD-10-CM | POA: Insufficient documentation

## 2022-06-10 DIAGNOSIS — I429 Cardiomyopathy, unspecified: Secondary | ICD-10-CM | POA: Insufficient documentation

## 2022-06-10 DIAGNOSIS — F1411 Cocaine abuse, in remission: Secondary | ICD-10-CM

## 2022-06-10 DIAGNOSIS — Z79899 Other long term (current) drug therapy: Secondary | ICD-10-CM | POA: Diagnosis not present

## 2022-06-10 DIAGNOSIS — I10 Essential (primary) hypertension: Secondary | ICD-10-CM

## 2022-06-10 DIAGNOSIS — B351 Tinea unguium: Secondary | ICD-10-CM | POA: Diagnosis not present

## 2022-06-10 DIAGNOSIS — F17210 Nicotine dependence, cigarettes, uncomplicated: Secondary | ICD-10-CM | POA: Insufficient documentation

## 2022-06-10 DIAGNOSIS — K029 Dental caries, unspecified: Secondary | ICD-10-CM | POA: Diagnosis not present

## 2022-06-10 DIAGNOSIS — Z87891 Personal history of nicotine dependence: Secondary | ICD-10-CM

## 2022-06-10 DIAGNOSIS — Z794 Long term (current) use of insulin: Secondary | ICD-10-CM | POA: Diagnosis not present

## 2022-06-10 DIAGNOSIS — N529 Male erectile dysfunction, unspecified: Secondary | ICD-10-CM

## 2022-06-10 DIAGNOSIS — G8929 Other chronic pain: Secondary | ICD-10-CM | POA: Insufficient documentation

## 2022-06-10 LAB — POCT GLYCOSYLATED HEMOGLOBIN (HGB A1C): Hemoglobin A1C: 9.9 % — AB (ref 4.0–5.6)

## 2022-06-10 MED ORDER — INSULIN LISPRO (1 UNIT DIAL) 100 UNIT/ML (KWIKPEN)
5.0000 [IU] | PEN_INJECTOR | Freq: Two times a day (BID) | SUBCUTANEOUS | 2 refills | Status: DC
  Filled 2022-06-10: qty 9, 90d supply, fill #0
  Filled 2022-10-22: qty 9, 90d supply, fill #1

## 2022-06-10 MED ORDER — ATORVASTATIN CALCIUM 40 MG PO TABS
40.0000 mg | ORAL_TABLET | Freq: Every day | ORAL | 1 refills | Status: DC
  Filled 2022-06-10 – 2022-07-07 (×2): qty 90, 90d supply, fill #0

## 2022-06-10 MED ORDER — SILDENAFIL CITRATE 100 MG PO TABS
100.0000 mg | ORAL_TABLET | Freq: Every day | ORAL | 1 refills | Status: DC | PRN
  Filled 2022-06-10: qty 10, 30d supply, fill #0
  Filled 2022-10-19 (×2): qty 10, 30d supply, fill #1

## 2022-06-10 MED ORDER — LANTUS SOLOSTAR 100 UNIT/ML ~~LOC~~ SOPN
30.0000 [IU] | PEN_INJECTOR | Freq: Every day | SUBCUTANEOUS | 4 refills | Status: AC
  Filled 2022-06-10: qty 15, 50d supply, fill #0
  Filled 2022-09-23: qty 15, 50d supply, fill #1
  Filled 2022-12-14: qty 15, 50d supply, fill #2
  Filled 2023-03-14: qty 15, 50d supply, fill #3

## 2022-06-10 MED ORDER — METFORMIN HCL 1000 MG PO TABS
1000.0000 mg | ORAL_TABLET | Freq: Two times a day (BID) | ORAL | 2 refills | Status: DC
  Filled 2022-06-10: qty 120, 60d supply, fill #0
  Filled 2022-09-13: qty 120, 60d supply, fill #1
  Filled 2022-11-04: qty 120, 60d supply, fill #2

## 2022-06-10 MED ORDER — LANTUS SOLOSTAR 100 UNIT/ML ~~LOC~~ SOPN
15.0000 [IU] | PEN_INJECTOR | Freq: Every day | SUBCUTANEOUS | 4 refills | Status: DC
  Filled 2022-06-10: qty 15, 100d supply, fill #0

## 2022-06-10 MED ORDER — SPIRONOLACTONE 25 MG PO TABS
12.5000 mg | ORAL_TABLET | Freq: Every day | ORAL | 4 refills | Status: DC
  Filled 2022-06-10: qty 30, 60d supply, fill #0
  Filled 2022-11-04: qty 30, 60d supply, fill #1

## 2022-06-10 MED ORDER — LANTUS SOLOSTAR 100 UNIT/ML ~~LOC~~ SOPN
14.0000 [IU] | PEN_INJECTOR | Freq: Every day | SUBCUTANEOUS | 4 refills | Status: DC
  Filled 2022-06-10: qty 12, 85d supply, fill #0

## 2022-06-10 MED ORDER — DAPAGLIFLOZIN PROPANEDIOL 10 MG PO TABS
10.0000 mg | ORAL_TABLET | Freq: Every day | ORAL | 2 refills | Status: DC
  Filled 2022-06-10: qty 30, 30d supply, fill #0
  Filled 2022-07-13: qty 30, 30d supply, fill #1
  Filled 2022-08-23: qty 30, 30d supply, fill #2

## 2022-06-10 NOTE — Assessment & Plan Note (Signed)
Stable cardiomyopathy no active heart failure

## 2022-06-10 NOTE — Telephone Encounter (Signed)
Patient had visit with Joya Gaskins today

## 2022-06-10 NOTE — Assessment & Plan Note (Signed)
Referral to podiatry made

## 2022-06-10 NOTE — Assessment & Plan Note (Signed)
    Current smoking consumption amount: One half pack a week  Dicsussion on advise to quit smoking and smoking impacts: Cardiovascular impacts  Patient's willingness to quit: Not ready to quit  Methods to quit smoking discussed: Not discussed  Medication management of smoking session drugs discussed: Not discussed  Resources provided:  AVS   Setting quit date not yet obtained  Follow-up arranged short-term follow-up   Time spent counseling the patient: 5 minutes

## 2022-06-10 NOTE — Assessment & Plan Note (Signed)
Increase Viagra 100 mg as needed

## 2022-06-10 NOTE — Assessment & Plan Note (Signed)
Reassess lipids continue atorvastatin

## 2022-06-10 NOTE — Addendum Note (Signed)
Addended by: Elsie Stain on: 06/10/2022 05:50 AM   Modules accepted: Orders

## 2022-06-10 NOTE — Assessment & Plan Note (Signed)
As per cardiomyopathy

## 2022-06-10 NOTE — Progress Notes (Addendum)
Established Patient Office Visit  Subjective:  Patient ID: James [name]., male    DOB: [birth year]  Age: 62 y.o. MRN: [id number]  CC:  Chief Complaint  Patient presents with   Hypertension   Diabetes    HPI  04/23/21 James [name]. presents for complaint of ankle swelling. He states that he first noticed it on 04/21/21. He states that it has been painful. He states he has no other symptoms.  In December 2022, he was seen in the ED for pneumonia of both lungs, and treated with antibiotics. He states that he has been feeling much better since then, and that he is not coughing or experiencing any problems with his breathing right now.  The patient also presented to the ED after a drug overdose. He was treated with Narcan and was discharged home with stable vitals and labs. He states that this was disappointing to him, as he had not been using any illicit substances for 18 months prior to this incident. He states that he has not used any substances since December 24, and he does not want to use anymore. He reports that his mood is good at this time.  This patient has a history of Type II diabetes. He states that he measures his sugars once a day, in the morning, and states that his values have been high recently. Per the patient, they have been in 2/29/24360-380 range. He thinks this may be due to inconsistent medication adherence in December when he was using drugs. Since December 24, he states he has been taking all of his diabetes medications as prescribed. He states that this morning's value was 96.  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 He is followed by cardiology (Dr. 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 He states that he is taking  his prescribed medications.   9/7 This patient seen in return follow-up has not been seen since January of this year.  He was in a 90-day cocaine rehab  treatment center and has been out for 30 days thus he has been sober for 120 days.  He has had a increased cough for the last 2 weeks productive of green mucus.  He has no other referable symptoms to COVID.  Blood sugars have been in the 100-140 range and he does smoke a pack of cigarettes a week.  He has been off some of his heart failure medications.  He needs to get back in with cardiology as well.  9/20   This patient is seen in short-term follow-up at last visit had acute bronchitis he received a course of azithromycin the cough is now resolved.  He states his blood sugars at home are improved anywhere from 120-135 the lowest of 97.  He was called by cardiology for an appointment but he is just to follow-up on this.  Patient would like a refill on his Viagra.  He has no other complaints.  He remains abstinent from cocaine.  He is taking all his other medications except he has not had the Entresto filled as of yet.  06/10/22 The patient is seen in return follow-up and on arrival blood pressure is good 130/65.  Patient is still smoking a few cigarettes daily.  He complains of right knee pain in the posterior section.  Also has difficulty with pain in the feet.  Patient is followed by dentist for severe  dental decay and they have referred him  to oral surgery.  Patient with a type 2 diabetes and on arrival A1c is 9.9.  He is on the [place] and long-acting insulin.  He needs to be switched to glargine insulin due to insurance now he has Medicaid.  Maintains the Basaglar at 14 units daily and metformin  Blood pressure well-controlled on carvedilol, Entresto, spironolactone.  Patient is now clean from cocaine for 10 months and is congratulated on this.  Patient does need an eye exam.  Patient needs colon cancer screening Past Medical History:  Diagnosis Date   Cellulitis and abscess of foot [id number]   Chronic systolic heart failure (HCC)    Cocaine abuse ([place])    Cocaine dependence with  cocaine-induced mood disorder ([place])    Depression    Diabetes mellitus without complication ([place])    Essential hypertension    Multiple closed fractures of ribs of both sides    NICM (nonischemic cardiomyopathy) ([place]) EF 20-25%    Pneumonia of both lungs due to infectious organism 04/26/2016   Suicidal ideations 07/12/2017    Past Surgical History:  Procedure Laterality Date   CARDIAC CATHETERIZATION N/A 04/30/2016   Procedure: Right/Left Heart Cath and Coronary Angiography;  Surgeon: Nelva Bush, MD;  Location: [facility];  Service: Cardiovascular;  Laterality: N/A;   FINGER SURGERY     ICD IMPLANT N/A 12/16/2020   Procedure: ICD IMPLANT;  Surgeon: Vickie Epley, MD;  Location: [facility];  Service: Cardiovascular;  Laterality: N/A;   LEFT HEART CATH AND CORONARY ANGIOGRAPHY N/A 12/12/2020   Procedure: LEFT HEART CATH AND CORONARY ANGIOGRAPHY;  Surgeon: Troy Sine, MD;  Location: [facility];  Service: Cardiovascular;  Laterality: N/A;    Family History  Problem Relation Age of Onset   Diabetes Other    Hypertension Other    Diabetes Mother    Brain cancer Father    Mental illness Brother    Drug abuse Brother    Diabetes Brother     Social History   Socioeconomic History   Marital status: Married    Spouse name: Not on file   Number of children: Not on file   Years of education: Not on file   Highest education level: Not on file  Occupational History   Not on file  Tobacco Use   Smoking status: Every Day    Packs/day: 0.50    Years: 23.00    Total pack years: 11.50    Types: Cigarettes   Smokeless tobacco: Never   Tobacco comments:    Every other day   Vaping Use   Vaping Use: Never used  Substance and Sexual Activity   Alcohol use: Not Currently   Drug use: Not Currently    Types: Cocaine, "Crack" cocaine   Sexual activity: Not Currently  Other Topics Concern   Not on file  Social History Narrative   Not on file   Social  Determinants of Health   Financial Resource Strain: Not on file  Food Insecurity: Not on file  Transportation Needs: Not on file  Physical Activity: Not on file  Stress: Not on file  Social Connections: Not on file  Intimate Partner Violence: Not on file    Outpatient Medications Prior to Visit  Medication Sig Dispense Refill   aspirin 81 MG chewable tablet Chew 81 mg by mouth daily. 1Daily     blood glucose meter kit and supplies Dispense based on patient and insurance preference. Use up to four times  daily as directed. (FOR ICD-9 250.00, 250.01). 1 each 0   Blood Glucose Monitoring Suppl (TRUE METRIX METER) w/Device KIT Use to measure blood sugar twice a day 1 kit 0   carvedilol (COREG) 25 MG tablet Take 1 tablet (25 mg total) by mouth 2 (two) times daily with a meal. 60 tablet 6   cholecalciferol (VITAMIN D3) 25 MCG (1000 UNIT) tablet Take 1 tablet (1,000 Units total) by mouth daily. 60 tablet 1   glucose blood test strip USE TO CHECK BLOOD SUGARS 2 TIMES DAILY 100 strip 12   Insulin Pen Needle (TRUEPLUS 5-BEVEL PEN NEEDLES) 32G X 4 MM MISC Use to inject Basaglar once a day. 100 each 1   sacubitril-valsartan (ENTRESTO) 24-26 MG Take 1 tablet by mouth 2 (two) times daily. 180 tablet 3   TRUEplus Lancets 28G MISC Use to measure blood sugar twice a day 100 each 1   atorvastatin (LIPITOR) 40 MG tablet Take 1 tablet (40 mg total) by mouth daily. 90 tablet 1   ciclopirox (PENLAC) 8 % solution Apply topically at bedtime. Apply over nail and surrounding skin. Apply daily over previous coat. After seven (7) days, may remove with alcohol and continue cycle. 6.6 mL 0   dapagliflozin propanediol (FARXIGA) 10 MG TABS tablet Take 1 tablet (10 mg total) by mouth daily before breakfast. 30 tablet 2   metFORMIN (GLUCOPHAGE) 1000 MG tablet Take 1 tablet (1,000 mg total) by mouth 2 (two) times daily with a meal. 120 tablet 2   sildenafil (VIAGRA) 50 MG tablet Take 1 tablet (50 mg total) by mouth daily as  needed for erectile dysfunction. 10 tablet 1   spironolactone (ALDACTONE) 25 MG tablet Take 0.5 tablets (12.5 mg total) by mouth daily. 30 tablet 4   insulin glargine (LANTUS SOLOSTAR) 100 UNIT/ML Solostar Pen Inject 14 Units into the skin daily. (Patient not taking: Reported on 06/10/2022) 15 mL 4   No facility-administered medications prior to visit.    No Known Allergies  ROS Review of Systems  Constitutional: Negative.   HENT: Negative.  Negative for ear pain, postnasal drip, rhinorrhea, sinus pressure, sore throat, trouble swallowing and voice change.   Eyes: Negative.   Respiratory: Negative.  Negative for apnea, cough, choking, chest tightness, shortness of breath, wheezing and stridor.   Cardiovascular: Negative.  Negative for chest pain, palpitations and leg swelling.  Gastrointestinal: Negative.  Negative for abdominal distention, abdominal pain, nausea and vomiting.  Endocrine: Negative.   Genitourinary: Negative.   Musculoskeletal: Negative.  Negative for arthralgias and myalgias.       Foot pain and calluses  Skin: Negative.  Negative for rash.  Allergic/Immunologic: Negative.  Negative for environmental allergies and food allergies.  Neurological: Negative.  Negative for dizziness, syncope, weakness and headaches.  Hematological: Negative.  Negative for adenopathy. Does not bruise/bleed easily.  Psychiatric/Behavioral: Negative.  Negative for agitation, dysphoric mood, sleep disturbance and suicidal ideas. The patient is not nervous/anxious.       Objective:    Physical Exam Vitals reviewed.  Constitutional:      General: He is not in acute distress.    Appearance: Normal appearance. He is well-developed. He is not diaphoretic.  HENT:     Head: Normocephalic and atraumatic.     Nose: No nasal deformity, septal deviation, mucosal edema or rhinorrhea.     Right Sinus: No maxillary sinus tenderness or frontal sinus tenderness.     Left Sinus: No maxillary sinus  tenderness or frontal sinus tenderness.  Mouth/Throat:     Pharynx: No oropharyngeal exudate.  Eyes:     General: No scleral icterus.    Conjunctiva/sclera: Conjunctivae normal.     Pupils: Pupils are equal, round, and reactive to light.  Neck:     Thyroid: No thyromegaly.     Vascular: No carotid bruit or JVD.     Trachea: Trachea normal. No tracheal tenderness or tracheal deviation.  Cardiovascular:     Rate and Rhythm: Normal rate and regular rhythm.     Chest Wall: PMI is not displaced.     Pulses: Normal pulses. No decreased pulses.     Heart sounds: S1 normal and S2 normal. Heart sounds not distant. Murmur heard.     Systolic (holosystolic, best heard at left lower sternal border) murmur is present.     No diastolic murmur is present.     No friction rub. No gallop. No S3 or S4 sounds.  Pulmonary:     Effort: Pulmonary effort is normal. No tachypnea, accessory muscle usage or respiratory distress.     Breath sounds: Normal breath sounds. No stridor. No decreased breath sounds, wheezing, rhonchi or rales.  Chest:     Chest wall: No tenderness.  Abdominal:     General: Bowel sounds are normal. There is no distension.     Palpations: Abdomen is soft. Abdomen is not rigid.     Tenderness: There is no abdominal tenderness. There is no guarding or rebound.  Musculoskeletal:        General: Normal range of motion.     Cervical back: Normal range of motion and neck supple. No edema, erythema or rigidity. No muscular tenderness. Normal range of motion.     Right lower leg: No edema.     Left lower leg: No edema.     Comments: Calluses on both feet  Lymphadenopathy:     Head:     Right side of head: No submental or submandibular adenopathy.     Left side of head: No submental or submandibular adenopathy.     Cervical: No cervical adenopathy.  Skin:    General: Skin is warm and dry.     Coloration: Skin is not pale.     Findings: No rash.     Nails: There is no clubbing.      Comments: Bilateral onychomycosis both great toenails  Neurological:     General: No focal deficit present.     Mental Status: He is alert and oriented to person, place, and time.     Sensory: No sensory deficit.  Psychiatric:        Mood and Affect: Mood normal.        Speech: Speech normal.        Behavior: Behavior normal.     BP 130/65   Pulse 87   Ht 6' (1.829 m)   Wt 157 lb (71.2 kg)   SpO2 99%   BMI 21.29 kg/m  Wt Readings from Last 3 Encounters:  06/10/22 157 lb (71.2 kg)  03/10/22 159 lb (72.1 kg)  12/30/21 158 lb 9.6 oz (71.9 kg)   CXR 9/8:  NORMAL  Health Maintenance Due  Topic Date Due   COLON CANCER SCREENING ANNUAL FOBT  12/25/2020    There are no preventive care reminders to display for this patient.  Lab Results  Component Value Date   TSH 2.155 01/16/2017   Lab Results  Component Value Date   WBC 3.9 12/17/2021   HGB 12.9 (L)  12/17/2021   HCT 39.1 12/17/2021   MCV 84 12/17/2021   PLT 241 12/17/2021   Lab Results  Component Value Date   NA 141 12/17/2021   K 4.5 12/17/2021   CO2 20 12/17/2021   GLUCOSE 194 (H) 12/17/2021   BUN 20 12/17/2021   CREATININE 0.93 12/17/2021   BILITOT <0.2 12/17/2021   ALKPHOS 88 12/17/2021   AST 16 12/17/2021   ALT 23 12/17/2021   PROT 7.0 12/17/2021   ALBUMIN 4.5 12/17/2021   CALCIUM 9.9 12/17/2021   ANIONGAP 9 06/18/2021   EGFR 92 12/17/2021   Lab Results  Component Value Date   CHOL 182 12/17/2021   Lab Results  Component Value Date   HDL 69 12/17/2021   Lab Results  Component Value Date   LDLCALC 99 12/17/2021   Lab Results  Component Value Date   TRIG 73 12/17/2021   Lab Results  Component Value Date   CHOLHDL 2.6 12/17/2021   Lab Results  Component Value Date   HGBA1C 9.9 (A) 06/10/2022      Assessment & Plan:   Problem List Items Addressed This Visit       Cardiovascular and Mediastinum   Essential hypertension    Blood pressure well-controlled no change in medication       Relevant Medications   atorvastatin (LIPITOR) 40 MG tablet   sildenafil (VIAGRA) 100 MG tablet   spironolactone (ALDACTONE) 25 MG tablet   Cardiomyopathy- suspect NICM but etiology not yet determined    Stable cardiomyopathy no active heart failure      Relevant Medications   atorvastatin (LIPITOR) 40 MG tablet   sildenafil (VIAGRA) 100 MG tablet   spironolactone (ALDACTONE) 25 MG tablet   Chronic combined systolic and diastolic heart failure (HCC)    As per cardiomyopathy      Relevant Medications   atorvastatin (LIPITOR) 40 MG tablet   sildenafil (VIAGRA) 100 MG tablet   spironolactone (ALDACTONE) 25 MG tablet   Other Relevant Orders   CBC with Differential/Platelet     Endocrine   Uncontrolled type 2 diabetes mellitus with hyperglycemia (HCC) - Primary    Uncontrolled type 2 diabetes now worsening A1c  Plan for this patient is to increase insulin Lantus to 30 units a day and begin short acting insulin 5 units before 2 of the meals a day and increase metformin to 1000 mg twice daily  Patient will see clinical pharmacy short-term      Relevant Medications   atorvastatin (LIPITOR) 40 MG tablet   dapagliflozin propanediol (FARXIGA) 10 MG TABS tablet   insulin glargine (LANTUS SOLOSTAR) 100 UNIT/ML Solostar Pen   metFORMIN (GLUCOPHAGE) 1000 MG tablet   insulin lispro (HUMALOG) 100 UNIT/ML KwikPen   Other Relevant Orders   POCT glycosylated hemoglobin (Hb A1C) (Completed)   Comprehensive metabolic panel   Lipid panel   Ambulatory referral to Podiatry     Musculoskeletal and Integument   Onychomycosis    Referral to podiatry made      Foot callus    Podiatry referral      Relevant Orders   Ambulatory referral to Podiatry     Other   HYPERCHOLESTEROLEMIA    Reassess lipids continue atorvastatin      Relevant Medications   atorvastatin (LIPITOR) 40 MG tablet   sildenafil (VIAGRA) 100 MG tablet   spironolactone (ALDACTONE) 25 MG tablet   History of  cocaine abuse ([place])   History of tobacco use  Current smoking consumption amount: One half pack a week  Dicsussion on advise to quit smoking and smoking impacts: Cardiovascular impacts  Patient's willingness to quit: Not ready to quit  Methods to quit smoking discussed: Not discussed  Medication management of smoking session drugs discussed: Not discussed  Resources provided:  AVS   Setting quit date not yet obtained  Follow-up arranged short-term follow-up   Time spent counseling the patient: 5 minutes       Vasculogenic erectile dysfunction    Increase Viagra 100 mg as needed      Chronic pain of right knee    Referral to orthopedic surgery made      Relevant Orders   Ambulatory referral to Orthopedic Surgery   Colon cancer screening    Colon cancer screening fecal occult      Relevant Orders   Fecal occult blood, imunochemical   Meds ordered this encounter  Medications   atorvastatin (LIPITOR) 40 MG tablet    Sig: Take 1 tablet (40 mg total) by mouth daily.    Dispense:  90 tablet    Refill:  1   dapagliflozin propanediol (FARXIGA) 10 MG TABS tablet    Sig: Take 1 tablet (10 mg total) by mouth daily before breakfast.    Dispense:  30 tablet    Refill:  2   insulin glargine (LANTUS SOLOSTAR) 100 UNIT/ML Solostar Pen    Sig: Inject 30 Units into the skin daily.    Dispense:  15 mL    Refill:  4   metFORMIN (GLUCOPHAGE) 1000 MG tablet    Sig: Take 1 tablet (1,000 mg total) by mouth 2 (two) times daily with a meal.    Dispense:  120 tablet    Refill:  2   sildenafil (VIAGRA) 100 MG tablet    Sig: Take 1 tablet (100 mg total) by mouth daily as needed for erectile dysfunction.    Dispense:  10 tablet    Refill:  1   spironolactone (ALDACTONE) 25 MG tablet    Sig: Take 0.5 tablets (12.5 mg total) by mouth daily.    Dispense:  30 tablet    Refill:  4   insulin lispro (HUMALOG) 100 UNIT/ML KwikPen    Sig: Inject 5 Units into the skin 2 (two) times  daily before lunch and supper. Hold if blood sugar is less than 150    Dispense:  15 mL    Refill:  2  Has to pick up a fecal occult kit for colon cancer screening 35 minutes spent extra time needed going over multiple systems and patient education Follow-up: Return in about 3 months (around 09/08/2022) for htn.    Asencion Noble, MD

## 2022-06-10 NOTE — Assessment & Plan Note (Signed)
Blood pressure well controlled no change in medication 

## 2022-06-10 NOTE — Assessment & Plan Note (Signed)
Uncontrolled type 2 diabetes now worsening A1c  Plan for this patient is to increase insulin Lantus to 30 units a day and begin short acting insulin 5 units before 2 of the meals a day and increase metformin to 1000 mg twice daily  Patient will see clinical pharmacy short-term

## 2022-06-10 NOTE — Telephone Encounter (Signed)
done 

## 2022-06-10 NOTE — Assessment & Plan Note (Signed)
Referral to orthopedic surgery made

## 2022-06-10 NOTE — Progress Notes (Signed)
Pain behind right knee for the past two months.

## 2022-06-10 NOTE — Assessment & Plan Note (Signed)
Podiatry referral

## 2022-06-10 NOTE — Assessment & Plan Note (Signed)
Colon cancer screening fecal occult

## 2022-06-10 NOTE — Patient Instructions (Addendum)
Refills on all medications sent to pharmacy downstairs Increase the Lantus when you receive it to 30 units daily Start insulin lispro 5 units before lunch and supper hold if blood sugar less than 150 Complete set of labs obtained today Referral to foot doctor and orthopedics was sent See liver clinical pharmacist 1 month Dr. Joya Gaskins 3 months

## 2022-06-11 ENCOUNTER — Telehealth: Payer: Self-pay

## 2022-06-11 LAB — COMPREHENSIVE METABOLIC PANEL
ALT: 11 IU/L (ref 0–44)
AST: 13 IU/L (ref 0–40)
Albumin/Globulin Ratio: 1.8 (ref 1.2–2.2)
Albumin: 4.3 g/dL (ref 3.9–4.9)
Alkaline Phosphatase: 70 IU/L (ref 44–121)
BUN/Creatinine Ratio: 15 (ref 10–24)
BUN: 14 mg/dL (ref 8–27)
Bilirubin Total: 0.2 mg/dL (ref 0.0–1.2)
CO2: 23 mmol/L (ref 20–29)
Calcium: 9.2 mg/dL (ref 8.6–10.2)
Chloride: 100 mmol/L (ref 96–106)
Creatinine, Ser: 0.95 mg/dL (ref 0.76–1.27)
Globulin, Total: 2.4 g/dL (ref 1.5–4.5)
Glucose: 194 mg/dL — ABNORMAL HIGH (ref 70–99)
Potassium: 4.3 mmol/L (ref 3.5–5.2)
Sodium: 138 mmol/L (ref 134–144)
Total Protein: 6.7 g/dL (ref 6.0–8.5)
eGFR: 90 mL/min/{1.73_m2} (ref >59)

## 2022-06-11 LAB — CBC WITH DIFFERENTIAL/PLATELET
Basophils Absolute: 0 10*3/uL (ref 0.0–0.2)
Basos: 1 %
EOS (ABSOLUTE): 0.3 10*3/uL (ref 0.0–0.4)
Eos: 8 %
Hematocrit: 40.5 % (ref 37.5–51.0)
Hemoglobin: 13.1 g/dL (ref 13.0–17.7)
Immature Grans (Abs): 0 10*3/uL (ref 0.0–0.1)
Immature Granulocytes: 0 %
Lymphocytes Absolute: 1.4 10*3/uL (ref 0.7–3.1)
Lymphs: 36 %
MCH: 27.3 pg (ref 26.6–33.0)
MCHC: 32.3 g/dL (ref 31.5–35.7)
MCV: 85 fL (ref 79–97)
Monocytes Absolute: 0.2 10*3/uL (ref 0.1–0.9)
Monocytes: 6 %
Neutrophils Absolute: 1.9 10*3/uL (ref 1.4–7.0)
Neutrophils: 49 %
Platelets: 222 10*3/uL (ref 150–450)
RBC: 4.79 x10E6/uL (ref 4.14–5.80)
RDW: 13.9 % (ref 11.6–15.4)
WBC: 3.9 10*3/uL (ref 3.4–10.8)

## 2022-06-11 LAB — LIPID PANEL
Chol/HDL Ratio: 2.4 ratio (ref 0.0–5.0)
Cholesterol, Total: 160 mg/dL (ref 100–199)
HDL: 66 mg/dL (ref >39)
LDL Chol Calc (NIH): 83 mg/dL (ref 0–99)
Triglycerides: 55 mg/dL (ref 0–149)
VLDL Cholesterol Cal: 11 mg/dL (ref 5–40)

## 2022-06-11 NOTE — Progress Notes (Signed)
Let mr Lunde know kidney, liver normal,blood count normal, cholesterol at goal Follow med recommendations made at [place]

## 2022-06-11 NOTE — Telephone Encounter (Signed)
Pt was called and is aware of results, DOB was confirmed.  ?

## 2022-06-11 NOTE — Telephone Encounter (Signed)
-----   Message from Elsie Stain, MD sent at 06/11/2022  8:22 AM EST ----- Let mr Rua know kidney, liver normal,blood count normal, cholesterol at goal Follow med recommendations made at [place]

## 2022-06-17 ENCOUNTER — Ambulatory Visit: Payer: Medicaid Other

## 2022-06-17 DIAGNOSIS — I428 Other cardiomyopathies: Secondary | ICD-10-CM

## 2022-06-18 LAB — CUP PACEART REMOTE DEVICE CHECK
Battery Remaining Longevity: 129 mo
Battery Voltage: 3.01 V
Brady Statistic RV Percent Paced: 0 %
Date Time Interrogation Session: 20240307223624
HighPow Impedance: 67 Ohm
Implantable Lead Connection Status: 753985
Implantable Lead Implant Date: 20220906
Implantable Lead Location: 753860
Implantable Pulse Generator Implant Date: 20220906
Lead Channel Impedance Value: 247 Ohm
Lead Channel Impedance Value: 304 Ohm
Lead Channel Pacing Threshold Amplitude: 0.625 V
Lead Channel Pacing Threshold Pulse Width: 0.4 ms
Lead Channel Sensing Intrinsic Amplitude: 8.375 mV
Lead Channel Sensing Intrinsic Amplitude: 8.375 mV
Lead Channel Setting Pacing Amplitude: 2 V
Lead Channel Setting Pacing Pulse Width: 0.4 ms
Lead Channel Setting Sensing Sensitivity: 0.3 mV
Zone Setting Status: 755011
Zone Setting Status: 755011

## 2022-06-21 ENCOUNTER — Encounter: Payer: Self-pay | Admitting: Cardiology

## 2022-06-22 ENCOUNTER — Ambulatory Visit: Payer: Medicaid Other | Admitting: Orthopaedic Surgery

## 2022-06-22 ENCOUNTER — Ambulatory Visit: Payer: Medicaid Other | Admitting: Podiatry

## 2022-06-25 ENCOUNTER — Other Ambulatory Visit: Payer: Self-pay

## 2022-06-25 ENCOUNTER — Other Ambulatory Visit: Payer: Self-pay | Admitting: Cardiology

## 2022-06-25 DIAGNOSIS — I428 Other cardiomyopathies: Secondary | ICD-10-CM

## 2022-06-25 MED ORDER — ACCU-CHEK GUIDE ME W/DEVICE KIT
PACK | 0 refills | Status: AC
  Filled 2022-06-25: qty 1, 30d supply, fill #0

## 2022-06-25 MED ORDER — ACCU-CHEK SOFTCLIX LANCETS MISC
12 refills | Status: AC
  Filled 2022-06-25: qty 100, 50d supply, fill #0

## 2022-06-25 MED ORDER — ACCU-CHEK GUIDE VI STRP
ORAL_STRIP | 12 refills | Status: AC
  Filled 2022-06-25: qty 100, 50d supply, fill #0

## 2022-06-28 ENCOUNTER — Other Ambulatory Visit: Payer: Self-pay

## 2022-06-29 ENCOUNTER — Ambulatory Visit (INDEPENDENT_AMBULATORY_CARE_PROVIDER_SITE_OTHER): Payer: Medicaid Other | Admitting: Podiatry

## 2022-06-29 ENCOUNTER — Encounter: Payer: Self-pay | Admitting: Podiatry

## 2022-06-29 DIAGNOSIS — M2041 Other hammer toe(s) (acquired), right foot: Secondary | ICD-10-CM

## 2022-06-29 DIAGNOSIS — M79675 Pain in left toe(s): Secondary | ICD-10-CM

## 2022-06-29 DIAGNOSIS — E119 Type 2 diabetes mellitus without complications: Secondary | ICD-10-CM

## 2022-06-29 DIAGNOSIS — L84 Corns and callosities: Secondary | ICD-10-CM | POA: Diagnosis not present

## 2022-06-29 DIAGNOSIS — B351 Tinea unguium: Secondary | ICD-10-CM | POA: Diagnosis not present

## 2022-06-29 DIAGNOSIS — E1165 Type 2 diabetes mellitus with hyperglycemia: Secondary | ICD-10-CM | POA: Diagnosis not present

## 2022-06-29 DIAGNOSIS — M79674 Pain in right toe(s): Secondary | ICD-10-CM | POA: Diagnosis not present

## 2022-06-29 NOTE — Patient Instructions (Signed)
Let me know when your A1c is lower for the anti fungal medication   Look for urea 40% cream or ointment and apply to the thickened dry skin / calluses. This can be bought over the counter, at a pharmacy or online such as [employer].   More silicone pads can be purchased from:  [email or web address]

## 2022-06-30 ENCOUNTER — Other Ambulatory Visit: Payer: Self-pay | Admitting: Cardiology

## 2022-06-30 ENCOUNTER — Other Ambulatory Visit: Payer: Self-pay

## 2022-06-30 ENCOUNTER — Telehealth: Payer: Self-pay | Admitting: Cardiology

## 2022-06-30 DIAGNOSIS — I428 Other cardiomyopathies: Secondary | ICD-10-CM

## 2022-06-30 NOTE — Telephone Encounter (Signed)
*  STAT* If patient is at the pharmacy, call can be transferred to refill team.   1. Which medications need to be refilled? (please list name of each medication and dose if known) sacubitril-valsartan (ENTRESTO) 24-26 MG   2. Which pharmacy/location (including street and city if local pharmacy) is medication to be sent to? [place]    3. Do they need a 30 day or 90 day supply? 90 day

## 2022-07-02 ENCOUNTER — Other Ambulatory Visit: Payer: Self-pay

## 2022-07-02 DIAGNOSIS — Z1211 Encounter for screening for malignant neoplasm of colon: Secondary | ICD-10-CM | POA: Diagnosis not present

## 2022-07-02 DIAGNOSIS — H5213 Myopia, bilateral: Secondary | ICD-10-CM | POA: Diagnosis not present

## 2022-07-02 MED ORDER — SACUBITRIL-VALSARTAN 24-26 MG PO TABS
1.0000 | ORAL_TABLET | Freq: Two times a day (BID) | ORAL | 3 refills | Status: DC
  Filled 2022-07-02 – 2022-10-19 (×4): qty 180, 90d supply, fill #0

## 2022-07-03 LAB — FECAL OCCULT BLOOD, IMMUNOCHEMICAL: Fecal Occult Bld: NEGATIVE

## 2022-07-04 NOTE — Progress Notes (Signed)
  Subjective:  Patient ID: James Comings., male    DOB: [birth year],  MRN: [id number]  Chief Complaint  Patient presents with   Diabetes    Diabetic foot care, A1C  9.9   Nail Problem    Thick painful toenails   Callouses    Painful lesion between right 4th and 5th toes    62 y.o. male presents with the above complaint. History confirmed with patient.   Objective:  Physical Exam: warm, good capillary refill, no trophic changes or ulcerative lesions, normal DP and PT pulses, and some neuropathic changes and loss of neck sensation. Left Foot: dystrophic yellowed discolored nail plates with subungual debris Right Foot: dystrophic yellowed discolored nail plates with subungual debris, hammertoes of fourth and fifth toes with interdigital heloma molle  Assessment:   1. Pain due to onychomycosis of toenails of both feet   2. Heloma molle   3. Hammertoe of right foot   4. Encounter for diabetic foot exam ([place])   5. Uncontrolled type 2 diabetes mellitus with hyperglycemia ([place])      Plan:  Patient was evaluated and treated and all questions answered.   Patient educated on diabetes. Discussed proper diabetic foot care and discussed risks and complications of disease. Educated patient in depth on reasons to return to the office immediately should he/she discover anything concerning or new on the feet. All questions answered. Discussed proper shoes as well.   Discussed the etiology and treatment options for the condition in detail with the patient. Educated patient on the topical and oral treatment options for mycotic nails. Recommended debridement of the nails today. Sharp and mechanical debridement performed of all painful and mycotic nails today. Nails debrided in length and thickness using a nail nipper to level of comfort. Discussed treatment options including appropriate shoe gear. Follow up as needed for painful nails.  Could consider Lamisil therapy, would like his A1c to improve  further to see if he can get off some of his diabetic meds in order to treat more effectively.   Discussed etiology of callus on the fourth and fifth toes on the right.  Recommended offloading silicone pads, lesion was debrided as a courtesy today.  Also recommended utilizing urea cream.  Return if symptoms worsen or fail to improve.

## 2022-07-04 NOTE — Progress Notes (Signed)
Let pt know fecal occult neg  no colon cancer recheck one year

## 2022-07-05 ENCOUNTER — Telehealth: Payer: Self-pay

## 2022-07-05 NOTE — Telephone Encounter (Signed)
-----   Message from Elsie Stain, MD sent at 07/04/2022  1:33 PM EDT ----- Let pt know fecal occult neg  no colon cancer recheck one year

## 2022-07-05 NOTE — Telephone Encounter (Signed)
Pt was called and is aware of results, DOB was confirmed.  ?

## 2022-07-07 ENCOUNTER — Other Ambulatory Visit: Payer: Self-pay

## 2022-07-13 ENCOUNTER — Other Ambulatory Visit: Payer: Self-pay

## 2022-07-13 ENCOUNTER — Ambulatory Visit: Payer: Medicaid Other | Admitting: Orthopaedic Surgery

## 2022-07-15 ENCOUNTER — Ambulatory Visit: Payer: Medicaid Other | Admitting: Pharmacist

## 2022-07-16 ENCOUNTER — Other Ambulatory Visit: Payer: Self-pay

## 2022-07-16 MED ORDER — IBUPROFEN 800 MG PO TABS
800.0000 mg | ORAL_TABLET | ORAL | 0 refills | Status: DC
  Filled 2022-07-16: qty 20, 6d supply, fill #0

## 2022-07-16 MED ORDER — CHLORHEXIDINE GLUCONATE 0.12 % MT SOLN
OROMUCOSAL | 1 refills | Status: DC
  Filled 2022-07-16: qty 473, 30d supply, fill #0
  Filled 2022-07-23: qty 473, 30d supply, fill #1

## 2022-07-16 MED ORDER — PENICILLIN V POTASSIUM 500 MG PO TABS
500.0000 mg | ORAL_TABLET | Freq: Four times a day (QID) | ORAL | 0 refills | Status: DC
  Filled 2022-07-16: qty 40, 10d supply, fill #0

## 2022-07-19 NOTE — Progress Notes (Signed)
Remote ICD transmission.   

## 2022-07-21 ENCOUNTER — Other Ambulatory Visit: Payer: Self-pay

## 2022-07-21 MED ORDER — IBUPROFEN 800 MG PO TABS
800.0000 mg | ORAL_TABLET | ORAL | 0 refills | Status: DC
  Filled 2022-07-21: qty 20, 5d supply, fill #0

## 2022-07-23 ENCOUNTER — Other Ambulatory Visit: Payer: Self-pay

## 2022-07-30 ENCOUNTER — Ambulatory Visit: Payer: Medicaid Other | Admitting: Orthopaedic Surgery

## 2022-08-11 ENCOUNTER — Other Ambulatory Visit: Payer: Self-pay

## 2022-08-11 ENCOUNTER — Telehealth: Payer: Self-pay | Admitting: Pharmacist

## 2022-08-11 NOTE — Telephone Encounter (Signed)
Called patient to schedule an appointment for a diabetes visit. He was supposed to see me in April but no-showed his appt on 07/15/2022. I was unable to reach the patient so I left a HIPAA-compliant message requesting that the patient return my call.   Butch Penny, PharmD, [place], CPP Clinical Pharmacist [facility] [phone]

## 2022-08-12 NOTE — Progress Notes (Signed)
HPI: FU cardiomyopathy and congestive heart failure.  Patient admitted in January 2018 with dyspnea felt to be a combination of pneumonia and congestive heart failure. Echocardiogram showed severely reduced LV function with ejection fraction 20-25%, restrictive filling, mild left ventricular enlargement, mild to moderate mitral regurgitation and moderately elevated pulmonary pressures. Cardiac catheterization January 2018 showed 50% LAD but otherwise no obstructive coronary disease. Patient was treated with antibiotics and diuretics with improvement. ABIs June 2018 normal. Follow-up echocardiogram July 2018 showed normal LV systolic function and grade 1 diastolic dysfunction.  Patient suffered a cardiac arrest September 2022.  Cardiac MRI September 2022 showed ejection fraction 32%, mild RV dysfunction and findings consistent with nonischemic cardiomyopathy.  Cardiac catheterization September 2022 showed 50% mid LAD.  Had ICD implanted September 2022.  Abdominal CT August 2022 showed indeterminate 1.6 cm right renal lesion and follow-up MRI recommended.  Echocardiogram February 2024 showed normal LV function, mild left ventricular hypertrophy, grade 1 diastolic dysfunction.  Since last seen 02/04/21, he denies dyspnea, chest pain, palpitations or syncope.  Current Outpatient Medications  Medication Sig Dispense Refill   Accu-Chek Softclix Lancets lancets Use as instructed 100 each 12   aspirin 81 MG chewable tablet Chew 81 mg by mouth daily. 1Daily     atorvastatin (LIPITOR) 40 MG tablet Take 1 tablet (40 mg total) by mouth daily. 90 tablet 1   blood glucose meter kit and supplies Dispense based on patient and insurance preference. Use up to four times daily as directed. (FOR ICD-9 250.00, 250.01). 1 each 0   Blood Glucose Monitoring Suppl (ACCU-CHEK GUIDE ME) w/Device KIT Use to measure blood sugar twice a day 1 kit 0   Blood Glucose Monitoring Suppl (TRUE METRIX METER) w/Device KIT Use to measure  blood sugar twice a day 1 kit 0   carvedilol (COREG) 25 MG tablet Take 1 tablet (25 mg total) by mouth 2 (two) times daily with a meal. 60 tablet 6   chlorhexidine (PERIDEX) 0.12 % solution RINSE MOUTH WITH (1 CAPFUL) FOR 30 SECONDS AM AND PM AFTER TOOTHBRUSHING. EXPECTORATE AFTER RINSING, DO NOT SWALLOW 473 mL 1   cholecalciferol (VITAMIN D3) 25 MCG (1000 UNIT) tablet Take 1 tablet (1,000 Units total) by mouth daily. 60 tablet 1   dapagliflozin propanediol (FARXIGA) 10 MG TABS tablet Take 1 tablet (10 mg total) by mouth daily before breakfast. 30 tablet 2   glucose blood (ACCU-CHEK GUIDE) test strip Use as instructed 100 each 12   glucose blood test strip USE TO CHECK BLOOD SUGARS 2 TIMES DAILY 100 strip 12   ibuprofen (IBU) 800 MG tablet Take 1 tablet (800 mg total) by mouth every 6 to 8 hours as needed. 20 tablet 0   insulin glargine (LANTUS SOLOSTAR) 100 UNIT/ML Solostar Pen Inject 30 Units into the skin daily. 15 mL 4   insulin lispro (HUMALOG) 100 UNIT/ML KwikPen Inject 5 Units into the skin 2 (two) times daily before lunch and supper. Hold if blood sugar is less than 150 15 mL 2   Insulin Pen Needle (TRUEPLUS 5-BEVEL PEN NEEDLES) 32G X 4 MM MISC Use to inject Basaglar once a day. 100 each 1   metFORMIN (GLUCOPHAGE) 1000 MG tablet Take 1 tablet (1,000 mg total) by mouth 2 (two) times daily with a meal. 120 tablet 2   penicillin v potassium (VEETID) 500 MG tablet Take 1 tablet (500 mg total) by mouth 4 (four) times daily until gone. 40 tablet 0   sacubitril-valsartan (ENTRESTO)  24-26 MG Take 1 tablet by mouth 2 (two) times daily. 180 tablet 3   sildenafil (VIAGRA) 100 MG tablet Take 1 tablet (100 mg total) by mouth daily as needed for erectile dysfunction. 10 tablet 1   spironolactone (ALDACTONE) 25 MG tablet Take 0.5 tablets (12.5 mg total) by mouth daily. 30 tablet 4   TRUEplus Lancets 28G MISC Use to measure blood sugar twice a day 100 each 1   No current facility-administered  medications for this visit.     Past Medical History:  Diagnosis Date   Cellulitis and abscess of foot 09/29/2016   Chronic systolic heart failure (HCC)    Cocaine abuse (HCC)    Cocaine dependence with cocaine-induced mood disorder (HCC)    Depression    Diabetes mellitus without complication (HCC)    Essential hypertension    Multiple closed fractures of ribs of both sides    NICM (nonischemic cardiomyopathy) (HCC) EF 20-25%    Pneumonia of both lungs due to infectious organism 04/26/2016   Suicidal ideations 07/12/2017    Past Surgical History:  Procedure Laterality Date   CARDIAC CATHETERIZATION N/A 04/30/2016   Procedure: Right/Left Heart Cath and Coronary Angiography;  Surgeon: Yvonne Kendall, MD;  Location: [facility] INVASIVE CV LAB;  Service: Cardiovascular;  Laterality: N/A;   FINGER SURGERY     ICD IMPLANT N/A 12/16/2020   Procedure: ICD IMPLANT;  Surgeon: Lanier Prude, MD;  Location: [facility] INVASIVE CV LAB;  Service: Cardiovascular;  Laterality: N/A;   LEFT HEART CATH AND CORONARY ANGIOGRAPHY N/A 12/12/2020   Procedure: LEFT HEART CATH AND CORONARY ANGIOGRAPHY;  Surgeon: Lennette Bihari, MD;  Location: [facility] INVASIVE CV LAB;  Service: Cardiovascular;  Laterality: N/A;    Social History   Socioeconomic History   Marital status: Married    Spouse name: Not on file   Number of children: Not on file   Years of education: Not on file   Highest education level: Not on file  Occupational History   Not on file  Tobacco Use   Smoking status: Every Day    Packs/day: 0.50    Years: 23.00    Additional pack years: 0.00    Total pack years: 11.50    Types: Cigarettes   Smokeless tobacco: Never   Tobacco comments:    Every other day   Vaping Use   Vaping Use: Never used  Substance and Sexual Activity   Alcohol use: Not Currently   Drug use: Not Currently    Types: Cocaine, "Crack" cocaine   Sexual activity: Not Currently  Other Topics Concern   Not on file  Social History  Narrative   Not on file   Social Determinants of Health   Financial Resource Strain: Not on file  Food Insecurity: Not on file  Transportation Needs: Not on file  Physical Activity: Not on file  Stress: Not on file  Social Connections: Not on file  Intimate Partner Violence: Not on file    Family History  Problem Relation Age of Onset   Diabetes Other    Hypertension Other    Diabetes Mother    Brain cancer Father    Mental illness Brother    Drug abuse Brother    Diabetes Brother     ROS: no fevers or chills, productive cough, hemoptysis, dysphasia, odynophagia, melena, hematochezia, dysuria, hematuria, rash, seizure activity, orthopnea, PND, pedal edema, claudication. Remaining systems are negative.  Physical Exam: Well-developed well-nourished in no acute distress.  Skin is warm  and dry.  HEENT is normal.  Neck is supple.  Chest is clear to auscultation with normal expansion.  Cardiovascular exam is regular rate and rhythm.  Abdominal exam nontender or distended. No masses palpated. Extremities show no edema. neuro grossly intact  ECG-normal sinus rhythm at a rate of 79, no ST changes.  Personally reviewed  A/P  1 nonischemic cardiomyopathy-LV function improved on most recent echocardiogram.  Continue Entresto, carvedilol, spironolactone and Farxiga.  2 status post ICD-followed by Dr. Lalla Brothers.  3 hypertension-blood pressure controlled.  Continue present medications.  4 hyperlipidemia continue statin.  Recent LDL not at goal.  Increase Lipitor to 80 mg daily.  Check lipids and liver in 8 weeks.  5 coronary artery disease-mild on previous catheterization.  Continue aspirin and statin.  6 tobacco abuse-patient counseled on discontinuing.  7 history of right renal lesion-I previously asked the patient to follow-up with primary care for this issue.  Will arrange follow-up CTA.  Olga Millers, MD

## 2022-08-18 ENCOUNTER — Ambulatory Visit: Payer: Medicaid Other | Attending: Cardiology | Admitting: Cardiology

## 2022-08-18 ENCOUNTER — Other Ambulatory Visit: Payer: Self-pay

## 2022-08-18 ENCOUNTER — Encounter: Payer: Self-pay | Admitting: Cardiology

## 2022-08-18 VITALS — BP 133/74 | HR 79 | Ht 72.0 in | Wt 155.8 lb

## 2022-08-18 DIAGNOSIS — I10 Essential (primary) hypertension: Secondary | ICD-10-CM

## 2022-08-18 DIAGNOSIS — Z95810 Presence of automatic (implantable) cardiac defibrillator: Secondary | ICD-10-CM | POA: Diagnosis not present

## 2022-08-18 DIAGNOSIS — E7800 Pure hypercholesterolemia, unspecified: Secondary | ICD-10-CM | POA: Diagnosis not present

## 2022-08-18 DIAGNOSIS — N289 Disorder of kidney and ureter, unspecified: Secondary | ICD-10-CM

## 2022-08-18 DIAGNOSIS — I428 Other cardiomyopathies: Secondary | ICD-10-CM | POA: Diagnosis not present

## 2022-08-18 DIAGNOSIS — N2889 Other specified disorders of kidney and ureter: Secondary | ICD-10-CM | POA: Diagnosis not present

## 2022-08-18 MED ORDER — CARVEDILOL 25 MG PO TABS
25.0000 mg | ORAL_TABLET | Freq: Two times a day (BID) | ORAL | 6 refills | Status: DC
Start: 2022-08-18 — End: 2022-11-19
  Filled 2022-08-18 – 2022-09-13 (×2): qty 60, 30d supply, fill #0
  Filled 2022-10-13: qty 60, 30d supply, fill #1
  Filled 2022-11-04 – 2022-11-18 (×3): qty 60, 30d supply, fill #2

## 2022-08-18 MED ORDER — ATORVASTATIN CALCIUM 80 MG PO TABS
80.0000 mg | ORAL_TABLET | Freq: Every day | ORAL | 3 refills | Status: DC
Start: 2022-08-18 — End: 2022-11-19
  Filled 2022-08-18 – 2022-09-13 (×2): qty 90, 90d supply, fill #0
  Filled 2022-11-04: qty 30, 30d supply, fill #1

## 2022-08-18 NOTE — Patient Instructions (Signed)
Medication Instructions:   INCREASE ATORVASTATIN TO 80 MG ONCE DAILY= 2 OF THE 40 MG TABLETS ONCE DAILY  *If you need a refill on your cardiac medications before your next appointment, please call your pharmacy*   Lab Work:  Your physician recommends that you return for lab work in: 8 [facility]  If you have labs (blood work) drawn today and your tests are completely normal, you will receive your results only by: MyChart Message (if you have MyChart) OR A paper copy in the mail If you have any lab test that is abnormal or we need to change your treatment, we will call you to review the results.   Testing/Procedures:  CT OF THE ABDOMEN W/WO CONTRAST FOR RENAL LESION AT [facility] [address]   Follow-Up: At [facility], you and your health needs are our priority.  As part of our continuing mission to provide you with exceptional heart care, we have created designated Provider Care Teams.  These Care Teams include your primary Cardiologist (physician) and Advanced Practice Providers (APPs -  Physician Assistants and Nurse Practitioners) who all work together to provide you with the care you need, when you need it.  We recommend signing up for the patient portal called "MyChart".  Sign up information is provided on this After Visit Summary.  MyChart is used to connect with patients for Virtual Visits (Telemedicine).  Patients are able to view lab/test results, encounter notes, upcoming appointments, etc.  Non-urgent messages can be sent to your provider as well.   To learn more about what you can do with MyChart, go to [email or web address].    Your next appointment:   6 month(s)  Provider:   ANY APP   Your physician wants you to follow-up in: 12 MONTHS WITH DR Jens Som You will receive a reminder letter in the mail two months in advance. If you don't receive a letter, please call our office to schedule the follow-up appointment.

## 2022-08-20 ENCOUNTER — Telehealth: Payer: Self-pay

## 2022-08-20 ENCOUNTER — Other Ambulatory Visit (HOSPITAL_COMMUNITY): Payer: Self-pay

## 2022-08-20 NOTE — Telephone Encounter (Signed)
Pharmacy Patient Advocate Encounter   Received notification from [facility] that prior authorization for Entresto 24-26mg  is required/requested.   PA submitted on 5.10.24 to (ins) [employer] via CoverMyMeds  Key or (Medicaid) confirmation # [id number]   Status is pending

## 2022-08-23 ENCOUNTER — Other Ambulatory Visit: Payer: Self-pay

## 2022-08-23 NOTE — Telephone Encounter (Signed)
Pharmacy Patient Advocate Encounter  Prior Authorization for Entresto 24-26mg   has been approved by [employer] (ins).    Key or (Medicaid) confirmation # [id number]  Effective dates: 5.10.24 through 5.10.25

## 2022-09-12 NOTE — Progress Notes (Unsigned)
Established Patient Office Visit  Subjective:  Patient ID: James [name]., male    DOB: 10/20/1960  Age: 62 y.o. MRN: [id number]  CC:  No chief complaint on file.   HPI  04/23/21 [name]. presents for complaint of ankle swelling. He states that he first noticed it on 04/21/21. He states that it has been painful. He states he has no other symptoms.  In December 2022, he was seen in the ED for pneumonia of both lungs, and treated with antibiotics. He states that he has been feeling much better since then, and that he is not coughing or experiencing any problems with his breathing right now.  The patient also presented to the ED after a drug overdose. He was treated with Narcan and was discharged home with stable vitals and labs. He states that this was disappointing to him, as he had not been using any illicit substances for 18 months prior to this incident. He states that he has not used any substances since December 24, and he does not want to use anymore. He reports that his mood is good at this time.  This patient has a history of Type II diabetes. He states that he measures his sugars once a day, in the morning, and states that his values have been high recently. Per the patient, they have been in 2/29/24360-380 range. He thinks this may be due to inconsistent medication adherence in December when he was using drugs. Since December 24, he states he has been taking all of his diabetes medications as prescribed. He states that this morning's value was 96.  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 He is followed by cardiology (Dr. 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 He states that he is taking  his prescribed medications.   9/7 This patient seen in return follow-up has not been seen since January of this year.  He was in a 90-day cocaine rehab treatment center and has been out for 30 days  thus he has been sober for 120 days.  He has had a increased cough for the last 2 weeks productive of green mucus.  He has no other referable symptoms to COVID.  Blood sugars have been in the 100-140 range and he does smoke a pack of cigarettes a week.  He has been off some of his heart failure medications.  He needs to get back in with cardiology as well.  9/20   This patient is seen in short-term follow-up at last visit had acute bronchitis he received a course of azithromycin the cough is now resolved.  He states his blood sugars at home are improved anywhere from 120-135 the lowest of 97.  He was called by cardiology for an appointment but he is just to follow-up on this.  Patient would like a refill on his Viagra.  He has no other complaints.  He remains abstinent from cocaine.  He is taking all his other medications except he has not had the Entresto filled as of yet.  06/10/22 The patient is seen in return follow-up and on arrival blood pressure is good 130/65.  Patient is still smoking a few cigarettes daily.  He complains of right knee pain in the posterior section.  Also has difficulty with pain in the feet.  Patient is followed by dentist for severe  dental decay and they have referred him to oral surgery.  Patient with a type  2 diabetes and on arrival A1c is 9.9.  He is on the [place] and long-acting insulin.  He needs to be switched to glargine insulin due to insurance now he has Medicaid.  Maintains the Basaglar at 14 units daily and metformin  Blood pressure well-controlled on carvedilol, Entresto, spironolactone.  Patient is now clean from cocaine for 10 months and is congratulated on this.  Patient does need an eye exam.  Patient needs colon cancer screening Past Medical History:  Diagnosis Date   Cellulitis and abscess of foot 09/29/2016   Chronic systolic heart failure (HCC)    Cocaine abuse (HCC)    Cocaine dependence with cocaine-induced mood disorder (HCC)    Depression     Diabetes mellitus without complication (HCC)    Essential hypertension    Multiple closed fractures of ribs of both sides    NICM (nonischemic cardiomyopathy) (HCC) EF 20-25%    Pneumonia of both lungs due to infectious organism 04/26/2016   Suicidal ideations 07/12/2017    Past Surgical History:  Procedure Laterality Date   CARDIAC CATHETERIZATION N/A 04/30/2016   Procedure: Right/Left Heart Cath and Coronary Angiography;  Surgeon: Yvonne Kendall, MD;  Location: [facility] INVASIVE CV LAB;  Service: Cardiovascular;  Laterality: N/A;   FINGER SURGERY     ICD IMPLANT N/A 12/16/2020   Procedure: ICD IMPLANT;  Surgeon: Lanier Prude, MD;  Location[facility] INVAS[facility] CV LAB;  Service: Cardiovascular;  Laterality: N/A;   LEFT HEART CATH AND CORONARY ANGIOGRAPHY N/A 12/12/2020   Procedure: LEFT HEART CATH AND CORONARY ANGIOGRAPHY;  Surgeon: Lennette Bihari, MD;  Location: [facility] INVASIVE CV LAB;  Service: Cardiovascular;  Laterality: N/A;    Family History  Problem Relation Age of Onset   Diabetes Other    Hypertension Other    Diabetes Mother    Brain cancer Father    Mental illness Brother    Drug abuse Brother    Diabetes Brother     Social History   Socioeconomic History   Marital status: Married    Spouse name: Not on file   Number of children: Not on file   Years of education: Not on file   Highest education level: Not on file  Occupational History   Not on file  Tobacco Use   Smoking status: Every Day    Packs/day: 0.50    Years: 23.00    Additional pack years: 0.00    Total pack years: 11.50    Types: Cigarettes   Smokeless tobacco: Never   Tobacco comments:    Every other day   Vaping Use   Vaping Use: Never used  Substance and Sexual Activity   Alcohol use: Not Currently   Drug use: Not Currently    Types: Cocaine, "Crack" cocaine   Sexual activity: Not Currently  Other Topics Concern   Not on file  Social History Narrative   Not on file   Social Determinants of Health    Financial Resource Strain: Not on file  Food Insecurity: Not on file  Transportation Needs: Not on file  Physical Activity: Not on file  Stress: Not on file  Social Connections: Not on file  Intimate Partner Violence: Not on file    Outpatient Medications Prior to Visit  Medication Sig Dispense Refill   Accu-Chek Softclix Lancets lancets Use as instructed 100 each 12   aspirin 81 MG chewable tablet Chew 81 mg by mouth daily. 1Daily     atorvastatin (LIPITOR) 80 MG tablet Take 1  tablet (80 mg total) by mouth daily. 90 tablet 3   blood glucose meter kit and supplies Dispense based on patient and insurance preference. Use up to four times daily as directed. (FOR ICD-9 250.00, 250.01). 1 each 0   Blood Glucose Monitoring Suppl (ACCU-CHEK GUIDE ME) w/Device KIT Use to measure blood sugar twice a day 1 kit 0   Blood Glucose Monitoring Suppl (TRUE METRIX METER) w/Device KIT Use to measure blood sugar twice a day 1 kit 0   carvedilol (COREG) 25 MG tablet Take 1 tablet (25 mg total) by mouth 2 (two) times daily with a meal. 60 tablet 6   chlorhexidine (PERIDEX) 0.12 % solution RINSE MOUTH WITH (1 CAPFUL) FOR 30 SECONDS AM AND PM AFTER TOOTHBRUSHING. EXPECTORATE AFTER RINSING, DO NOT SWALLOW 473 mL 1   cholecalciferol (VITAMIN D3) 25 MCG (1000 UNIT) tablet Take 1 tablet (1,000 Units total) by mouth daily. 60 tablet 1   dapagliflozin propanediol (FARXIGA) 10 MG TABS tablet Take 1 tablet (10 mg total) by mouth daily before breakfast. 30 tablet 2   glucose blood (ACCU-CHEK GUIDE) test strip Use as instructed 100 each 12   glucose blood test strip USE TO CHECK BLOOD SUGARS 2 TIMES DAILY 100 strip 12   ibuprofen (IBU) 800 MG tablet Take 1 tablet (800 mg total) by mouth every 6 to 8 hours as needed. 20 tablet 0   insulin glargine (LANTUS SOLOSTAR) 100 UNIT/ML Solostar Pen Inject 30 Units into the skin daily. 15 mL 4   insulin lispro (HUMALOG) 100 UNIT/ML KwikPen Inject 5 Units into the skin 2 (two)  times daily before lunch and supper. Hold if blood sugar is less than 150 15 mL 2   Insulin Pen Needle (TRUEPLUS 5-BEVEL PEN NEEDLES) 32G X 4 MM MISC Use to inject Basaglar once a day. 100 each 1   metFORMIN (GLUCOPHAGE) 1000 MG tablet Take 1 tablet (1,000 mg total) by mouth 2 (two) times daily with a meal. 120 tablet 2   penicillin v potassium (VEETID) 500 MG tablet Take 1 tablet (500 mg total) by mouth 4 (four) times daily until gone. 40 tablet 0   sacubitril-valsartan (ENTRESTO) 24-26 MG Take 1 tablet by mouth 2 (two) times daily. 180 tablet 3   sildenafil (VIAGRA) 100 MG tablet Take 1 tablet (100 mg total) by mouth daily as needed for erectile dysfunction. 10 tablet 1   spironolactone (ALDACTONE) 25 MG tablet Take 0.5 tablets (12.5 mg total) by mouth daily. 30 tablet 4   TRUEplus Lancets 28G MISC Use to measure blood sugar twice a day 100 each 1   No facility-administered medications prior to visit.    No Known Allergies  ROS Review of Systems  Constitutional: Negative.   HENT: Negative.  Negative for ear pain, postnasal drip, rhinorrhea, sinus pressure, sore throat, trouble swallowing and voice change.   Eyes: Negative.   Respiratory: Negative.  Negative for apnea, cough, choking, chest tightness, shortness of breath, wheezing and stridor.   Cardiovascular: Negative.  Negative for chest pain, palpitations and leg swelling.  Gastrointestinal: Negative.  Negative for abdominal distention, abdominal pain, nausea and vomiting.  Endocrine: Negative.   Genitourinary: Negative.   Musculoskeletal: Negative.  Negative for arthralgias and myalgias.       Foot pain and calluses  Skin: Negative.  Negative for rash.  Allergic/Immunologic: Negative.  Negative for environmental allergies and food allergies.  Neurological: Negative.  Negative for dizziness, syncope, weakness and headaches.  Hematological: Negative.  Negative for  adenopathy. Does not bruise/bleed easily.  Psychiatric/Behavioral:  Negative.  Negative for agitation, dysphoric mood, sleep disturbance and suicidal ideas. The patient is not nervous/anxious.       Objective:    Physical Exam Vitals reviewed.  Constitutional:      General: He is not in acute distress.    Appearance: Normal appearance. He is well-developed. He is not diaphoretic.  HENT:     Head: Normocephalic and atraumatic.     Nose: No nasal deformity, septal deviation, mucosal edema or rhinorrhea.     Right Sinus: No maxillary sinus tenderness or frontal sinus tenderness.     Left Sinus: No maxillary sinus tenderness or frontal sinus tenderness.     Mouth/Throat:     Pharynx: No oropharyngeal exudate.  Eyes:     General: No scleral icterus.    Conjunctiva/sclera: Conjunctivae normal.     Pupils: Pupils are equal, round, and reactive to light.  Neck:     Thyroid: No thyromegaly.     Vascular: No carotid bruit or JVD.     Trachea: Trachea normal. No tracheal tenderness or tracheal deviation.  Cardiovascular:     Rate and Rhythm: Normal rate and regular rhythm.     Chest Wall: PMI is not displaced.     Pulses: Normal pulses. No decreased pulses.     Heart sounds: S1 normal and S2 normal. Heart sounds not distant. Murmur heard.     Systolic (holosystolic, best heard at left lower sternal border) murmur is present.     No diastolic murmur is present.     No friction rub. No gallop. No S3 or S4 sounds.  Pulmonary:     Effort: Pulmonary effort is normal. No tachypnea, accessory muscle usage or respiratory distress.     Breath sounds: Normal breath sounds. No stridor. No decreased breath sounds, wheezing, rhonchi or rales.  Chest:     Chest wall: No tenderness.  Abdominal:     General: Bowel sounds are normal. There is no distension.     Palpations: Abdomen is soft. Abdomen is not rigid.     Tenderness: There is no abdominal tenderness. There is no guarding or rebound.  Musculoskeletal:        General: Normal range of motion.     Cervical  back: Normal range of motion and neck supple. No edema, erythema or rigidity. No muscular tenderness. Normal range of motion.     Right lower leg: No edema.     Left lower leg: No edema.     Comments: Calluses on both feet  Lymphadenopathy:     Head:     Right side of head: No submental or submandibular adenopathy.     Left side of head: No submental or submandibular adenopathy.     Cervical: No cervical adenopathy.  Skin:    General: Skin is warm and dry.     Coloration: Skin is not pale.     Findings: No rash.     Nails: There is no clubbing.     Comments: Bilateral onychomycosis both great toenails  Neurological:     General: No focal deficit present.     Mental Status: He is alert and oriented to person, place, and time.     Sensory: No sensory deficit.  Psychiatric:        Mood and Affect: Mood normal.        Speech: Speech normal.        Behavior: Behavior normal.  There were no vitals taken for this visit. Wt Readings from Last 3 Encounters:  08/18/22 155 lb 12.8 oz (70.7 kg)  06/10/22 157 lb (71.2 kg)  03/10/22 159 lb (72.1 kg)   CXR 9/8:  NORMAL  There are no preventive care reminders to display for this patient.   There are no preventive care reminders to display for this patient.  Lab Results  Component Value Date   TSH 2.155 01/16/2017   Lab Results  Component Value Date   WBC 3.9 06/10/2022   HGB 13.1 06/10/2022   HCT 40.5 06/10/2022   MCV 85 06/10/2022   PLT 222 06/10/2022   Lab Results  Component Value Date   NA 138 06/10/2022   K 4.3 06/10/2022   CO2 23 06/10/2022   GLUCOSE 194 (H) 06/10/2022   BUN 14 06/10/2022   CREATININE 0.95 06/10/2022   BILITOT 0.2 06/10/2022   ALKPHOS 70 06/10/2022   AST 13 06/10/2022   ALT 11 06/10/2022   PROT 6.7 06/10/2022   ALBUMIN 4.3 06/10/2022   CALCIUM 9.2 06/10/2022   ANIONGAP 9 06/18/2021   EGFR 90 06/10/2022   Lab Results  Component Value Date   CHOL 160 06/10/2022   Lab Results  Component  Value Date   HDL 66 06/10/2022   Lab Results  Component Value Date   LDLCALC 83 06/10/2022   Lab Results  Component Value Date   TRIG 55 06/10/2022   Lab Results  Component Value Date   CHOLHDL 2.4 06/10/2022   Lab Results  Component Value Date   HGBA1C 9.9 (A) 06/10/2022      Assessment & Plan:   Problem List Items Addressed This Visit   None No orders of the defined types were placed in this encounter. Has to pick up a fecal occult kit for colon cancer screening 35 minutes spent extra time needed going over multiple systems and patient education Follow-up: No follow-ups on file.    Shan Levans, MD

## 2022-09-13 ENCOUNTER — Encounter: Payer: Self-pay | Admitting: Podiatry

## 2022-09-13 ENCOUNTER — Ambulatory Visit: Payer: Medicaid Other | Admitting: Podiatry

## 2022-09-13 ENCOUNTER — Other Ambulatory Visit: Payer: Self-pay

## 2022-09-13 DIAGNOSIS — L84 Corns and callosities: Secondary | ICD-10-CM | POA: Diagnosis not present

## 2022-09-13 DIAGNOSIS — E1165 Type 2 diabetes mellitus with hyperglycemia: Secondary | ICD-10-CM

## 2022-09-13 NOTE — Progress Notes (Signed)
  Subjective:  Patient ID: James [name]., male    DOB: 26-Feb-1961,  MRN: [id number]  Chief Complaint  Patient presents with   Toe Pain    Follow up 4th/5th toes right   "Dr. Lilian Kapur worked on a callus in between these toes and its still sore. I've trying padding it, but it doesn't help"    62 y.o. male presents with the above complaint. History confirmed with patient.   Objective:  Physical Exam: warm, good capillary refill, no trophic changes or ulcerative lesions, normal DP and PT pulses, and some neuropathic changes and loss of neck sensation. Left Foot: dystrophic yellowed discolored nail plates with subungual debris Right Foot: dystrophic yellowed discolored nail plates with subungual debris, hammertoes of fourth and fifth toes with interdigital heloma molle and maceration noted in the interdigital space.  Assessment:   1. Heloma molle   2. Uncontrolled type 2 diabetes mellitus with hyperglycemia (HCC)      Plan:  Patient was evaluated and treated and all questions answered.   Patient educated on diabetes. Discussed proper diabetic foot care and discussed risks and complications of disease. Educated patient in depth on reasons to return to the office immediately should he/she discover anything concerning or new on the feet. All questions answered. Discussed proper shoes as well.  Discussed etiology of callus on the fourth and fifth toes on the right.  Recommended offloading silicone pads, lesion was debrided as a courtesy today.  Advised to keep betadine in the area for time being to dry out the area and avoid callus pads.   Return if symptoms worsen or fail to improve.

## 2022-09-14 ENCOUNTER — Encounter: Payer: Self-pay | Admitting: Critical Care Medicine

## 2022-09-14 ENCOUNTER — Other Ambulatory Visit: Payer: Self-pay | Admitting: Pharmacist

## 2022-09-14 ENCOUNTER — Ambulatory Visit: Payer: Medicaid Other | Attending: Critical Care Medicine | Admitting: Critical Care Medicine

## 2022-09-14 ENCOUNTER — Other Ambulatory Visit: Payer: Self-pay

## 2022-09-14 VITALS — BP 127/74 | HR 85 | Wt 152.8 lb

## 2022-09-14 DIAGNOSIS — Z1211 Encounter for screening for malignant neoplasm of colon: Secondary | ICD-10-CM | POA: Diagnosis not present

## 2022-09-14 DIAGNOSIS — E7800 Pure hypercholesterolemia, unspecified: Secondary | ICD-10-CM

## 2022-09-14 DIAGNOSIS — Z7985 Long-term (current) use of injectable non-insulin antidiabetic drugs: Secondary | ICD-10-CM | POA: Diagnosis not present

## 2022-09-14 DIAGNOSIS — I5042 Chronic combined systolic (congestive) and diastolic (congestive) heart failure: Secondary | ICD-10-CM

## 2022-09-14 DIAGNOSIS — I10 Essential (primary) hypertension: Secondary | ICD-10-CM | POA: Diagnosis not present

## 2022-09-14 DIAGNOSIS — N5201 Erectile dysfunction due to arterial insufficiency: Secondary | ICD-10-CM

## 2022-09-14 DIAGNOSIS — E1165 Type 2 diabetes mellitus with hyperglycemia: Secondary | ICD-10-CM

## 2022-09-14 DIAGNOSIS — L84 Corns and callosities: Secondary | ICD-10-CM | POA: Diagnosis not present

## 2022-09-14 LAB — POCT GLYCOSYLATED HEMOGLOBIN (HGB A1C): HbA1c, POC (controlled diabetic range): 8.9 % — AB (ref 0.0–7.0)

## 2022-09-14 LAB — GLUCOSE, POCT (MANUAL RESULT ENTRY): POC Glucose: 149 mg/dl — AB (ref 70–99)

## 2022-09-14 MED ORDER — FREESTYLE LIBRE 2 READER DEVI
0 refills | Status: DC
  Filled 2022-09-14: qty 1, 30d supply, fill #0

## 2022-09-14 MED ORDER — FREESTYLE LIBRE 2 SENSOR MISC
0 refills | Status: DC
  Filled 2022-09-14: qty 2, 28d supply, fill #0

## 2022-09-14 NOTE — Patient Instructions (Addendum)
A prescription for a new continuous glucose monitor will be sent to pharmacy downstairs  , once it is available they will call you to bring it up to our clinic any day but Wednesday to see our pharmacist Franky Macho to train and apply the sensor on your arm  For now HOLD insulin before meal Stay on insulin glargine 16 units daily  No other changes  Dr Delford Field to call podiatry for other thoughts on your foot  Return 2 months

## 2022-09-15 ENCOUNTER — Encounter: Payer: Self-pay | Admitting: Critical Care Medicine

## 2022-09-15 ENCOUNTER — Other Ambulatory Visit: Payer: Self-pay

## 2022-09-15 NOTE — Assessment & Plan Note (Signed)
Type 2 diabetes still uncontrolled but approaching improvement we will maintain current medications for now as his A1c gradually is trending down and blood sugar control at home is improved

## 2022-09-15 NOTE — Assessment & Plan Note (Signed)
Fecal occult negative in March repeat 1 year

## 2022-09-15 NOTE — Assessment & Plan Note (Signed)
Continue with statin

## 2022-09-15 NOTE — Assessment & Plan Note (Signed)
Hypertension well-controlled no change in medications refills issued 

## 2022-09-15 NOTE — Assessment & Plan Note (Signed)
Referral to urology made.

## 2022-09-15 NOTE — Assessment & Plan Note (Signed)
Continued pain between fourth and fifth toe on the right.  Will ask podiatry if orthopedic diabetic foot wear would improve pain

## 2022-09-15 NOTE — Assessment & Plan Note (Signed)
Compensated heart failure °

## 2022-09-16 ENCOUNTER — Other Ambulatory Visit: Payer: Self-pay

## 2022-09-16 ENCOUNTER — Ambulatory Visit (INDEPENDENT_AMBULATORY_CARE_PROVIDER_SITE_OTHER): Payer: Medicaid Other

## 2022-09-16 DIAGNOSIS — I428 Other cardiomyopathies: Secondary | ICD-10-CM | POA: Diagnosis not present

## 2022-09-16 LAB — CUP PACEART REMOTE DEVICE CHECK
Battery Remaining Longevity: 127 mo
Battery Voltage: 3.01 V
Brady Statistic RV Percent Paced: 0 %
Date Time Interrogation Session: 20240606033624
HighPow Impedance: 58 Ohm
Implantable Lead Connection Status: 753985
Implantable Lead Implant Date: 20220906
Implantable Lead Location: 753860
Implantable Pulse Generator Implant Date: 20220906
Lead Channel Impedance Value: 228 Ohm
Lead Channel Impedance Value: 304 Ohm
Lead Channel Pacing Threshold Amplitude: 0.625 V
Lead Channel Pacing Threshold Pulse Width: 0.4 ms
Lead Channel Sensing Intrinsic Amplitude: 12.125 mV
Lead Channel Sensing Intrinsic Amplitude: 12.125 mV
Lead Channel Setting Pacing Amplitude: 2 V
Lead Channel Setting Pacing Pulse Width: 0.4 ms
Lead Channel Setting Sensing Sensitivity: 0.3 mV
Zone Setting Status: 755011
Zone Setting Status: 755011

## 2022-09-20 ENCOUNTER — Other Ambulatory Visit: Payer: Self-pay

## 2022-09-21 ENCOUNTER — Other Ambulatory Visit: Payer: Self-pay

## 2022-09-23 ENCOUNTER — Other Ambulatory Visit: Payer: Self-pay | Admitting: Critical Care Medicine

## 2022-09-23 ENCOUNTER — Other Ambulatory Visit (HOSPITAL_COMMUNITY): Payer: Self-pay

## 2022-09-23 ENCOUNTER — Other Ambulatory Visit: Payer: Self-pay

## 2022-09-23 MED ORDER — DAPAGLIFLOZIN PROPANEDIOL 10 MG PO TABS
10.0000 mg | ORAL_TABLET | Freq: Every day | ORAL | 2 refills | Status: DC
  Filled 2022-09-23: qty 30, 30d supply, fill #0
  Filled 2022-10-22: qty 30, 30d supply, fill #1
  Filled 2022-11-18: qty 30, 30d supply, fill #2

## 2022-09-24 ENCOUNTER — Other Ambulatory Visit: Payer: Self-pay

## 2022-09-24 ENCOUNTER — Ambulatory Visit
Admission: RE | Admit: 2022-09-24 | Discharge: 2022-09-24 | Disposition: A | Discharge status: Home / Self Care | Payer: Medicaid Other | Source: Ambulatory Visit | Attending: Cardiology | Admitting: Cardiology

## 2022-09-24 ENCOUNTER — Ambulatory Visit: Payer: Medicaid Other | Attending: Critical Care Medicine | Admitting: Pharmacist

## 2022-09-24 DIAGNOSIS — Z794 Long term (current) use of insulin: Secondary | ICD-10-CM | POA: Diagnosis not present

## 2022-09-24 DIAGNOSIS — Z7984 Long term (current) use of oral hypoglycemic drugs: Secondary | ICD-10-CM | POA: Diagnosis not present

## 2022-09-24 DIAGNOSIS — N2889 Other specified disorders of kidney and ureter: Secondary | ICD-10-CM | POA: Diagnosis not present

## 2022-09-24 DIAGNOSIS — N289 Disorder of kidney and ureter, unspecified: Secondary | ICD-10-CM

## 2022-09-24 DIAGNOSIS — E1165 Type 2 diabetes mellitus with hyperglycemia: Secondary | ICD-10-CM | POA: Diagnosis not present

## 2022-09-24 MED ORDER — IOPAMIDOL (ISOVUE-300) INJECTION 61%
100.0000 mL | Freq: Once | INTRAVENOUS | Status: AC | PRN
  Administered 2022-09-24: 100 mL via INTRAVENOUS

## 2022-09-24 NOTE — Progress Notes (Signed)
Patient was educated on the use of the [facility] 2 blood glucose meter. Reviewed necessary supplies and operation of the meter. Also reviewed goal blood glucose levels. Patient was able to demonstrate use. All questions and concerns were addressed.  Time spent counseling: 15 minutes  Follow-up: 3 weeks for CGM data review  Butch Penny, PharmD, [place], CPP Clinical Pharmacist [facility] [phone]

## 2022-10-06 NOTE — Progress Notes (Signed)
Remote ICD transmission.   

## 2022-10-13 ENCOUNTER — Other Ambulatory Visit: Payer: Self-pay

## 2022-10-15 ENCOUNTER — Ambulatory Visit: Payer: Medicaid Other | Admitting: Pharmacist

## 2022-10-19 ENCOUNTER — Other Ambulatory Visit: Payer: Self-pay

## 2022-10-20 ENCOUNTER — Ambulatory Visit: Payer: Medicaid Other | Admitting: Podiatry

## 2022-10-20 ENCOUNTER — Other Ambulatory Visit: Payer: Self-pay | Admitting: Pharmacist

## 2022-10-20 ENCOUNTER — Other Ambulatory Visit: Payer: Self-pay | Admitting: Critical Care Medicine

## 2022-10-20 ENCOUNTER — Encounter: Payer: Self-pay | Admitting: Podiatry

## 2022-10-20 ENCOUNTER — Other Ambulatory Visit: Payer: Self-pay

## 2022-10-20 DIAGNOSIS — L84 Corns and callosities: Secondary | ICD-10-CM | POA: Diagnosis not present

## 2022-10-20 DIAGNOSIS — E1165 Type 2 diabetes mellitus with hyperglycemia: Secondary | ICD-10-CM

## 2022-10-20 MED ORDER — FREESTYLE LIBRE 2 SENSOR MISC
6 refills | Status: DC
  Filled 2022-10-20: qty 2, 28d supply, fill #0
  Filled 2022-11-18: qty 2, 28d supply, fill #1

## 2022-10-20 NOTE — Progress Notes (Signed)
  Subjective:  Patient ID: James [name]., male    DOB: [birth year],  MRN: [id number]  No chief complaint on file.   62 y.o. male presents for concern of callus on his right foot.   History confirmed with patient.   Objective:  Physical Exam: warm, good capillary refill, no trophic changes or ulcerative lesions, normal DP and PT pulses, and some neuropathic changes and loss of neck sensation. Left Foot: dystrophic yellowed discolored nail plates with subungual debris Right Foot: dystrophic yellowed discolored nail plates with subungual debris, hammertoes of fourth and fifth toes with interdigital heloma molle and maceration noted in the interdigital space.  Assessment:   1. Heloma molle   2. Uncontrolled type 2 diabetes mellitus with hyperglycemia (HCC)       Plan:  Patient was evaluated and treated and all questions answered.   Patient educated on diabetes. Discussed proper diabetic foot care and discussed risks and complications of disease. Educated patient in depth on reasons to return to the office immediately should he/she discover anything concerning or new on the feet. All questions answered. Discussed proper shoes as well.  Discussed etiology of callus on the fourth and fifth toes on the right.  Recommended offloading silicone pads, lesion was debrided today without incident with chisel. .  Advised to keep betadine in the area for time being to dry out the area and avoid callus pads.   Return in about 3 months (around 01/20/2023) for rfc.

## 2022-10-21 ENCOUNTER — Other Ambulatory Visit: Payer: Self-pay

## 2022-10-22 ENCOUNTER — Other Ambulatory Visit: Payer: Self-pay

## 2022-11-04 ENCOUNTER — Other Ambulatory Visit: Payer: Self-pay

## 2022-11-04 ENCOUNTER — Other Ambulatory Visit: Payer: Self-pay | Admitting: Critical Care Medicine

## 2022-11-04 MED ORDER — SILDENAFIL CITRATE 100 MG PO TABS
100.0000 mg | ORAL_TABLET | Freq: Every day | ORAL | 1 refills | Status: DC | PRN
  Filled 2022-11-04: qty 10, 10d supply, fill #0
  Filled 2022-11-09: qty 10, 30d supply, fill #0
  Filled 2022-11-12: qty 10, 10d supply, fill #0

## 2022-11-04 NOTE — Telephone Encounter (Signed)
Requested Prescriptions  Pending Prescriptions Disp Refills   sildenafil (VIAGRA) 100 MG tablet 10 tablet 1    Sig: Take 1 tablet (100 mg total) by mouth daily as needed for erectile dysfunction.     Urology: Erectile Dysfunction Agents Passed - 11/04/2022  9:28 AM      Passed - AST in normal range and within 360 days    AST  Date Value Ref Range Status  06/10/2022 13 0 - 40 IU/L Final         Passed - ALT in normal range and within 360 days    ALT  Date Value Ref Range Status  06/10/2022 11 0 - 44 IU/L Final         Passed - Last BP in normal range    BP Readings from Last 1 Encounters:  09/14/22 127/74         Passed - Valid encounter within last 12 months    Recent Outpatient Visits           1 month ago Uncontrolled type 2 diabetes mellitus with hyperglycemia [facility])   [place] [facility] [place], [place] L, RPH-CPP   1 month ago Uncontrolled type 2 diabetes mellitus with hyperglycemia [facility])   [place] [facility] Storm Frisk, MD   4 months ago Uncontrolled type 2 diabetes mellitus with hyperglycemia [facility])   [place] [facility] Storm Frisk, MD   10 months ago Uncontrolled type 2 diabetes mellitus with hyperglycemia [facility])   [place] [facility] Storm Frisk, MD   10 months ago Acute bronchitis, unspecified organism   [facility] Storm Frisk, MD       Future Appointments             In 1 week Lois Huxley, Cornelius Moras, RPH-CPP [facility]   In 1 week Storm Frisk, MD [facility]

## 2022-11-09 ENCOUNTER — Other Ambulatory Visit: Payer: Self-pay

## 2022-11-12 ENCOUNTER — Other Ambulatory Visit: Payer: Self-pay

## 2022-11-12 ENCOUNTER — Ambulatory Visit: Payer: Medicaid Other | Admitting: Pharmacist

## 2022-11-14 NOTE — Progress Notes (Deleted)
Established Patient Office Visit  Subjective:  Patient ID: James [name]., male    DOB: 05/29/1960  Age: 62 y.o. MRN: [id number]  CC:  No chief complaint on file.   HPI  04/23/21 James [name]. presents for complaint of ankle swelling. He states that he first noticed it on 04/21/21. He states that it has been painful. He states he has no other symptoms.  In December 2022, he was seen in the ED for pneumonia of both lungs, and treated with antibiotics. He states that he has been feeling much better since then, and that he is not coughing or experiencing any problems with his breathing right now.  The patient also presented to the ED after a drug overdose. He was treated with Narcan and was discharged home with stable vitals and labs. He states that this was disappointing to him, as he had not been using any illicit substances for 18 months prior to this incident. He states that he has not used any substances since December 24, and he does not want to use anymore. He reports that his mood is good at this time.  This patient has a history of Type II diabetes. He states that he measures his sugars once a day, in the morning, and states that his values have been high recently. Per the patient, they have been in 2/29/24360-380 range. He thinks this may be due to inconsistent medication adherence in December when he was using drugs. Since December 24, he states he has been taking all of his diabetes medications as prescribed. He states that this morning's value was 96.  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 He is followed by cardiology (Dr. 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 He states that he is taking  his prescribed medications.   9/7 This patient seen in return follow-up has not been seen since January of this year.  He was in a 90-day cocaine rehab treatment center and has been out for 30 days  thus he has been sober for 120 days.  He has had a increased cough for the last 2 weeks productive of green mucus.  He has no other referable symptoms to COVID.  Blood sugars have been in the 100-140 range and he does smoke a pack of cigarettes a week.  He has been off some of his heart failure medications.  He needs to get back in with cardiology as well.  9/20   This patient is seen in short-term follow-up at last visit had acute bronchitis he received a course of azithromycin the cough is now resolved.  He states his blood sugars at home are improved anywhere from 120-135 the lowest of 97.  He was called by cardiology for an appointment but he is just to follow-up on this.  Patient would like a refill on his Viagra.  He has no other complaints.  He remains abstinent from cocaine.  He is taking all his other medications except he has not had the Entresto filled as of yet.  06/10/22 The patient is seen in return follow-up and on arrival blood pressure is good 130/65.  Patient is still smoking a few cigarettes daily.  He complains of right knee pain in the posterior section.  Also has difficulty with pain in the feet.  Patient is followed by dentist for severe  dental decay and they have referred him to oral surgery.  Patient with a type  2 diabetes and on arrival A1c is 9.9.  He is on the [place] and long-acting insulin.  He needs to be switched to glargine insulin due to insurance now he has Medicaid.  Maintains the Basaglar at 14 units daily and metformin  Blood pressure well-controlled on carvedilol, Entresto, spironolactone.  Patient is now clean from cocaine for 10 months and is congratulated on this.  Patient does need an eye exam.  Patient needs colon cancer screening  09/14/22 The patient returns in follow-up of hypertension and diabetes.  Since last visit he has had all his teeth extracted and is doing better from an oral standpoint getting dentures.  The largest complaint today is that of foot  pain on the right.  He does podiatry recommended conservative measures between the fourth and fifth toe on the right which has inflammation and maceration.  He has no foot ulcers.  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  Another issue is that he has erectile dysfunction would like to see urology.  Blood pressure on arrival is 122/74.  A1c is down to 8.9 from 9.9 and blood sugars at home are showing improvement.  He has now been clean from cocaine 12 months.  He did get an eye exam.  There is no retinopathy with the eye exam.  Heart failure has been stable.  11/16/22  Past Medical History:  Diagnosis Date   Cellulitis and abscess of foot 09/29/2016   Chronic systolic heart failure (HCC)    Cocaine abuse (HCC)    Cocaine dependence with cocaine-induced mood disorder (HCC)    Depression    Diabetes mellitus without complication (HCC)    Essential hypertension    Multiple closed fractures of ribs of both sides    NICM (nonischemic cardiomyopathy) (HCC) EF 20-25%    Pneumonia of both lungs due to infectious organism 04/26/2016   Suicidal ideations 07/12/2017    Past Surgical History:  Procedure Laterality Date   CARDIAC CATHETERIZATION N/A 04/30/2016   Procedure: Right/Left Heart Cath and Coronary Angiography;  Surgeon: Yvonne Kendall, MD;  Location: [facility] INVASIVE CV LAB;  Service: Cardiovascular;  Laterality: N/A;   FINGER SURGERY     ICD IMPLANT N/A 12/16/2020   Procedure: ICD IMPLANT;  Surgeon: Lanier Prude, MD;  Location: [facility] INVASIVE CV LAB;  Service: Cardiovascular;  Laterality: N/A;   LEFT HEART CATH AND CORONARY ANGIOGRAPHY N/A 12/12/2020   Procedure: LEFT HEART CATH AND CORONARY ANGIOGRAPHY;  Surgeon: Lennette Bihari, MD;  Location: [facility] INVASIVE CV LAB;  Service: Cardiovascular;  Laterality: N/A;    Family History  Problem Relation Age of Onset   Diabetes Other    Hypertension Other     Diabetes Mother    Brain cancer Father    Mental illness Brother    Drug abuse Brother    Diabetes Brother     Social History   Socioeconomic History   Marital status: Married    Spouse name: Not on file   Number of children: Not on file   Years of education: Not on file   Highest education level: Not on file  Occupational History   Not on file  Tobacco Use   Smoking status: Every Day    Current packs/day: 0.50    Average packs/day: 0.5 packs/day for 23.0 years (11.5 ttl pk-yrs)    Types: Cigarettes   Smokeless tobacco: Never  Tobacco comments:    Every other day   Vaping Use   Vaping status: Never Used  Substance and Sexual Activity   Alcohol use: Not Currently   Drug use: Not Currently    Types: Cocaine, "Crack" cocaine   Sexual activity: Not Currently  Other Topics Concern   Not on file  Social History Narrative   Not on file   Social Determinants of Health   Financial Resource Strain: Not on file  Food Insecurity: Not on file  Transportation Needs: Not on file  Physical Activity: Not on file  Stress: Not on file  Social Connections: Not on file  Intimate Partner Violence: Not on file    Outpatient Medications Prior to Visit  Medication Sig Dispense Refill   Accu-Chek Softclix Lancets lancets Use as instructed 100 each 12   aspirin 81 MG chewable tablet Chew 81 mg by mouth daily. 1Daily     atorvastatin (LIPITOR) 80 MG tablet Take 1 tablet (80 mg total) by mouth daily. 90 tablet 3   blood glucose meter kit and supplies Dispense based on patient and insurance preference. Use up to four times daily as directed. (FOR ICD-9 250.00, 250.01). 1 each 0   Blood Glucose Monitoring Suppl (ACCU-CHEK GUIDE ME) w/Device KIT Use to measure blood sugar twice a day 1 kit 0   Blood Glucose Monitoring Suppl (TRUE METRIX METER) w/Device KIT Use to measure blood sugar twice a day 1 kit 0   carvedilol (COREG) 25 MG tablet Take 1 tablet (25 mg total) by mouth 2 (two) times daily  with a meal. 60 tablet 6   chlorhexidine (PERIDEX) 0.12 % solution RINSE MOUTH WITH (1 CAPFUL) FOR 30 SECONDS AM AND PM AFTER TOOTHBRUSHING. EXPECTORATE AFTER RINSING, DO NOT SWALLOW 473 mL 1   cholecalciferol (VITAMIN D3) 25 MCG (1000 UNIT) tablet Take 1 tablet (1,000 Units total) by mouth daily. 60 tablet 1   Continuous Glucose Receiver (FREESTYLE LIBRE 2 READER) DEVI Use to check blood sugar continuously. Change sensors once every 14 days. 1 each 0   Continuous Glucose Sensor (FREESTYLE LIBRE 2 SENSOR) MISC Use to check blood sugar continuously. Change sensors once every 14 days. 2 each 6   dapagliflozin propanediol (FARXIGA) 10 MG TABS tablet Take 1 tablet (10 mg total) by mouth daily before breakfast. 30 tablet 2   glucose blood (ACCU-CHEK GUIDE) test strip Use as instructed 100 each 12   glucose blood test strip USE TO CHECK BLOOD SUGARS 2 TIMES DAILY 100 strip 12   ibuprofen (IBU) 800 MG tablet Take 1 tablet (800 mg total) by mouth every 6 to 8 hours as needed. 20 tablet 0   insulin glargine (LANTUS SOLOSTAR) 100 UNIT/ML Solostar Pen Inject 30 Units into the skin daily. (Patient taking differently: Inject 16 Units into the skin daily.) 15 mL 4   insulin lispro (HUMALOG) 100 UNIT/ML KwikPen Inject 5 Units into the skin 2 (two) times daily before lunch and supper. Hold if blood sugar is less than 150 15 mL 2   Insulin Pen Needle (TRUEPLUS 5-BEVEL PEN NEEDLES) 32G X 4 MM MISC Use to inject Basaglar once a day. 100 each 1   metFORMIN (GLUCOPHAGE) 1000 MG tablet Take 1 tablet (1,000 mg total) by mouth 2 (two) times daily with a meal. 120 tablet 2   penicillin v potassium (VEETID) 500 MG tablet Take 1 tablet (500 mg total) by mouth 4 (four) times daily until gone. 40 tablet 0   sacubitril-valsartan (ENTRESTO) 24-26  MG Take 1 tablet by mouth 2 (two) times daily. 180 tablet 3   sildenafil (VIAGRA) 100 MG tablet Take 1 tablet (100 mg total) by mouth daily as needed for erectile dysfunction. 10  tablet 1   spironolactone (ALDACTONE) 25 MG tablet Take 0.5 tablets (12.5 mg total) by mouth daily. 30 tablet 4   TRUEplus Lancets 28G MISC Use to measure blood sugar twice a day 100 each 1   No facility-administered medications prior to visit.    No Known Allergies  ROS Review of Systems  Constitutional: Negative.   HENT: Negative.  Negative for ear pain, postnasal drip, rhinorrhea, sinus pressure, sore throat, trouble swallowing and voice change.   Eyes: Negative.   Respiratory: Negative.  Negative for apnea, cough, choking, chest tightness, shortness of breath, wheezing and stridor.   Cardiovascular: Negative.  Negative for chest pain, palpitations and leg swelling.  Gastrointestinal: Negative.  Negative for abdominal distention, abdominal pain, nausea and vomiting.  Endocrine: Negative.   Genitourinary: Negative.        Erectile dysfunction persist despite Cialis  Musculoskeletal: Negative.  Negative for arthralgias and myalgias.       Foot pain and calluses which are unchanged  Skin: Negative.  Negative for rash.  Allergic/Immunologic: Negative.  Negative for environmental allergies and food allergies.  Neurological: Negative.  Negative for dizziness, syncope, weakness and headaches.  Hematological: Negative.  Negative for adenopathy. Does not bruise/bleed easily.  Psychiatric/Behavioral: Negative.  Negative for agitation, dysphoric mood, sleep disturbance and suicidal ideas. The patient is not nervous/anxious.       Objective:    Physical Exam Vitals reviewed.  Constitutional:      General: He is not in acute distress.    Appearance: Normal appearance. He is well-developed. He is not diaphoretic.  HENT:     Head: Normocephalic and atraumatic.     Nose: No nasal deformity, septal deviation, mucosal edema or rhinorrhea.     Right Sinus: No maxillary sinus tenderness or frontal sinus tenderness.     Left Sinus: No maxillary sinus tenderness or frontal sinus tenderness.      Mouth/Throat:     Pharynx: No oropharyngeal exudate.  Eyes:     General: No scleral icterus.    Conjunctiva/sclera: Conjunctivae normal.     Pupils: Pupils are equal, round, and reactive to light.  Neck:     Thyroid: No thyromegaly.     Vascular: No carotid bruit or JVD.     Trachea: Trachea normal. No tracheal tenderness or tracheal deviation.  Cardiovascular:     Rate and Rhythm: Normal rate and regular rhythm.     Chest Wall: PMI is not displaced.     Pulses: Normal pulses. No decreased pulses.     Heart sounds: S1 normal and S2 normal. Heart sounds not distant. Murmur heard.     Systolic (holosystolic, best heard at left lower sternal border) murmur is present.     No diastolic murmur is present.     No friction rub. No gallop. No S3 or S4 sounds.  Pulmonary:     Effort: Pulmonary effort is normal. No tachypnea, accessory muscle usage or respiratory distress.     Breath sounds: Normal breath sounds. No stridor. No decreased breath sounds, wheezing, rhonchi or rales.  Chest:     Chest wall: No tenderness.  Abdominal:     General: Bowel sounds are normal. There is no distension.     Palpations: Abdomen is soft. Abdomen is not rigid.  Tenderness: There is no abdominal tenderness. There is no guarding or rebound.  Musculoskeletal:        General: Normal range of motion.     Cervical back: Normal range of motion and neck supple. No edema, erythema or rigidity. No muscular tenderness. Normal range of motion.     Right lower leg: No edema.     Left lower leg: No edema.     Comments: Calluses on both feet and maceration between the fourth and fifth toe on the right with discomfort upon palpation but no ulcers  Lymphadenopathy:     Head:     Right side of head: No submental or submandibular adenopathy.     Left side of head: No submental or submandibular adenopathy.     Cervical: No cervical adenopathy.  Skin:    General: Skin is warm and dry.     Coloration: Skin is not pale.      Findings: No rash.     Nails: There is no clubbing.     Comments: Bilateral onychomycosis both great toenails  Neurological:     General: No focal deficit present.     Mental Status: He is alert and oriented to person, place, and time.     Sensory: No sensory deficit.  Psychiatric:        Mood and Affect: Mood normal.        Speech: Speech normal.        Behavior: Behavior normal.     There were no vitals taken for this visit. Wt Readings from Last 3 Encounters:  09/14/22 152 lb 12.8 oz (69.3 kg)  08/18/22 155 lb 12.8 oz (70.7 kg)  06/10/22 157 lb (71.2 kg)   CXR 9/8:  NORMAL  There are no preventive care reminders to display for this patient.   There are no preventive care reminders to display for this patient.  Lab Results  Component Value Date   TSH 2.155 01/16/2017   Lab Results  Component Value Date   WBC 3.9 06/10/2022   HGB 13.1 06/10/2022   HCT 40.5 06/10/2022   MCV 85 06/10/2022   PLT 222 06/10/2022   Lab Results  Component Value Date   NA 138 06/10/2022   K 4.3 06/10/2022   CO2 23 06/10/2022   GLUCOSE 194 (H) 06/10/2022   BUN 14 06/10/2022   CREATININE 0.95 06/10/2022   BILITOT 0.2 06/10/2022   ALKPHOS 70 06/10/2022   AST 13 06/10/2022   ALT 11 06/10/2022   PROT 6.7 06/10/2022   ALBUMIN 4.3 06/10/2022   CALCIUM 9.2 06/10/2022   ANIONGAP 9 06/18/2021   EGFR 90 06/10/2022   Lab Results  Component Value Date   CHOL 160 06/10/2022   Lab Results  Component Value Date   HDL 66 06/10/2022   Lab Results  Component Value Date   LDLCALC 83 06/10/2022   Lab Results  Component Value Date   TRIG 55 06/10/2022   Lab Results  Component Value Date   CHOLHDL 2.4 06/10/2022   Lab Results  Component Value Date   HGBA1C 8.9 (A) 09/14/2022      Assessment & Plan:   Problem List Items Addressed This Visit   None  No orders of the defined types were placed in this encounter. 30 minutes spent multiple systems assessed Follow-up: No  follow-ups on file.    Shan Levans, MD

## 2022-11-16 ENCOUNTER — Ambulatory Visit: Payer: Medicaid Other | Admitting: Critical Care Medicine

## 2022-11-19 ENCOUNTER — Ambulatory Visit: Payer: Medicaid Other | Attending: Critical Care Medicine | Admitting: Critical Care Medicine

## 2022-11-19 ENCOUNTER — Encounter: Payer: Self-pay | Admitting: Critical Care Medicine

## 2022-11-19 ENCOUNTER — Other Ambulatory Visit: Payer: Self-pay

## 2022-11-19 VITALS — BP 128/73 | HR 82 | Temp 98.2°F | Ht 72.0 in | Wt 152.6 lb

## 2022-11-19 DIAGNOSIS — I428 Other cardiomyopathies: Secondary | ICD-10-CM | POA: Diagnosis not present

## 2022-11-19 DIAGNOSIS — E1165 Type 2 diabetes mellitus with hyperglycemia: Secondary | ICD-10-CM | POA: Diagnosis not present

## 2022-11-19 DIAGNOSIS — L84 Corns and callosities: Secondary | ICD-10-CM | POA: Diagnosis not present

## 2022-11-19 DIAGNOSIS — K08109 Complete loss of teeth, unspecified cause, unspecified class: Secondary | ICD-10-CM

## 2022-11-19 DIAGNOSIS — N529 Male erectile dysfunction, unspecified: Secondary | ICD-10-CM | POA: Diagnosis not present

## 2022-11-19 DIAGNOSIS — Z7984 Long term (current) use of oral hypoglycemic drugs: Secondary | ICD-10-CM

## 2022-11-19 DIAGNOSIS — Z794 Long term (current) use of insulin: Secondary | ICD-10-CM

## 2022-11-19 DIAGNOSIS — I10 Essential (primary) hypertension: Secondary | ICD-10-CM

## 2022-11-19 DIAGNOSIS — I5042 Chronic combined systolic (congestive) and diastolic (congestive) heart failure: Secondary | ICD-10-CM | POA: Diagnosis not present

## 2022-11-19 DIAGNOSIS — E7800 Pure hypercholesterolemia, unspecified: Secondary | ICD-10-CM

## 2022-11-19 DIAGNOSIS — E119 Type 2 diabetes mellitus without complications: Secondary | ICD-10-CM | POA: Diagnosis not present

## 2022-11-19 MED ORDER — METFORMIN HCL ER 500 MG PO TB24
1000.0000 mg | ORAL_TABLET | Freq: Every day | ORAL | 3 refills | Status: AC
  Filled 2022-11-19: qty 180, 90d supply, fill #0
  Filled 2023-03-14: qty 180, 90d supply, fill #1

## 2022-11-19 MED ORDER — DAPAGLIFLOZIN PROPANEDIOL 10 MG PO TABS
10.0000 mg | ORAL_TABLET | Freq: Every day | ORAL | 3 refills | Status: AC
  Filled 2022-11-19: qty 90, 90d supply, fill #0
  Filled 2023-03-14: qty 90, 90d supply, fill #1

## 2022-11-19 MED ORDER — FREESTYLE LIBRE 2 SENSOR MISC
6 refills | Status: AC
  Filled 2022-11-19: qty 6, fill #0
  Filled 2022-12-14: qty 2, 28d supply, fill #0
  Filled 2023-01-13 – 2023-03-14 (×2): qty 2, 28d supply, fill #1

## 2022-11-19 MED ORDER — ATORVASTATIN CALCIUM 80 MG PO TABS
80.0000 mg | ORAL_TABLET | Freq: Every day | ORAL | 3 refills | Status: AC
  Filled 2022-11-19 – 2023-03-14 (×2): qty 90, 90d supply, fill #0

## 2022-11-19 MED ORDER — INSULIN LISPRO (1 UNIT DIAL) 100 UNIT/ML (KWIKPEN)
5.0000 [IU] | PEN_INJECTOR | Freq: Two times a day (BID) | SUBCUTANEOUS | 2 refills | Status: DC
  Filled 2022-11-19: qty 15, 150d supply, fill #0
  Filled 2022-12-14: qty 9, 90d supply, fill #0
  Filled 2023-03-14: qty 9, 84d supply, fill #0

## 2022-11-19 MED ORDER — VITAMIN D 25 MCG (1000 UNIT) PO TABS
1000.0000 [IU] | ORAL_TABLET | Freq: Every day | ORAL | 1 refills | Status: AC
  Filled 2022-11-19: qty 30, 30d supply, fill #0

## 2022-11-19 MED ORDER — CARVEDILOL 25 MG PO TABS
25.0000 mg | ORAL_TABLET | Freq: Two times a day (BID) | ORAL | 6 refills | Status: AC
  Filled 2022-11-19 (×2): qty 180, 90d supply, fill #0
  Filled 2023-03-14: qty 180, 90d supply, fill #1

## 2022-11-19 MED ORDER — SILDENAFIL CITRATE 100 MG PO TABS
100.0000 mg | ORAL_TABLET | Freq: Every day | ORAL | 6 refills | Status: AC | PRN
  Filled 2022-11-19 – 2022-12-14 (×2): qty 30, 30d supply, fill #0
  Filled 2023-03-14: qty 30, 30d supply, fill #1

## 2022-11-19 MED ORDER — CARVEDILOL 25 MG PO TABS
25.0000 mg | ORAL_TABLET | Freq: Two times a day (BID) | ORAL | 6 refills | Status: DC
Start: 2022-11-19 — End: 2022-11-19
  Filled 2022-11-19: qty 180, 90d supply, fill #0

## 2022-11-19 MED ORDER — SPIRONOLACTONE 25 MG PO TABS
12.5000 mg | ORAL_TABLET | Freq: Every day | ORAL | 4 refills | Status: AC
  Filled 2022-11-19: qty 90, 180d supply, fill #0
  Filled 2023-03-14: qty 15, 30d supply, fill #0

## 2022-11-19 MED ORDER — SACUBITRIL-VALSARTAN 24-26 MG PO TABS
1.0000 | ORAL_TABLET | Freq: Two times a day (BID) | ORAL | 3 refills | Status: AC
  Filled 2022-11-19: qty 180, 90d supply, fill #0

## 2022-11-19 MED ORDER — TRUEPLUS 5-BEVEL PEN NEEDLES 32G X 4 MM MISC
1 refills | Status: AC
  Filled 2022-11-19 – 2023-03-14 (×2): qty 100, 30d supply, fill #0

## 2022-11-19 NOTE — Progress Notes (Unsigned)
Established Patient Office Visit  Subjective:  Patient ID: James [name]., male    DOB: 02-28-1961  Age: 62 y.o. MRN: [id number]  CC:  No chief complaint on file.   HPI  04/23/21 James [name]. presents for complaint of ankle swelling. He states that he first noticed it on 04/21/21. He states that it has been painful. He states he has no other symptoms.  In December 2022, he was seen in the ED for pneumonia of both lungs, and treated with antibiotics. He states that he has been feeling much better since then, and that he is not coughing or experiencing any problems with his breathing right now.  The patient also presented to the ED after a drug overdose. He was treated with Narcan and was discharged home with stable vitals and labs. He states that this was disappointing to him, as he had not been using any illicit substances for 18 months prior to this incident. He states that he has not used any substances since December 24, and he does not want to use anymore. He reports that his mood is good at this time.  This patient has a history of Type II diabetes. He states that he measures his sugars once a day, in the morning, and states that his values have been high recently. Per the patient, they have been in 2/29/24360-380 range. He thinks this may be due to inconsistent medication adherence in December when he was using drugs. Since December 24, he states he has been taking all of his diabetes medications as prescribed. He states that this morning's value was 96.  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 He is followed by cardiology (Dr. 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 He states that he is taking  his prescribed medications.   9/7 This patient seen in return follow-up has not been seen since January of this year.  He was in a 90-day cocaine rehab treatment center and has been out for 30 days  thus he has been sober for 120 days.  He has had a increased cough for the last 2 weeks productive of green mucus.  He has no other referable symptoms to COVID.  Blood sugars have been in the 100-140 range and he does smoke a pack of cigarettes a week.  He has been off some of his heart failure medications.  He needs to get back in with cardiology as well.  9/20   This patient is seen in short-term follow-up at last visit had acute bronchitis he received a course of azithromycin the cough is now resolved.  He states his blood sugars at home are improved anywhere from 120-135 the lowest of 97.  He was called by cardiology for an appointment but he is just to follow-up on this.  Patient would like a refill on his Viagra.  He has no other complaints.  He remains abstinent from cocaine.  He is taking all his other medications except he has not had the Entresto filled as of yet.  06/10/22 The patient is seen in return follow-up and on arrival blood pressure is good 130/65.  Patient is still smoking a few cigarettes daily.  He complains of right knee pain in the posterior section.  Also has difficulty with pain in the feet.  Patient is followed by dentist for severe  dental decay and they have referred him to oral surgery.  Patient with a type  2 diabetes and on arrival A1c is 9.9.  He is on the [place] and long-acting insulin.  He needs to be switched to glargine insulin due to insurance now he has Medicaid.  Maintains the Basaglar at 14 units daily and metformin  Blood pressure well-controlled on carvedilol, Entresto, spironolactone.  Patient is now clean from cocaine for 10 months and is congratulated on this.  Patient does need an eye exam.  Patient needs colon cancer screening  09/14/22 The patient returns in follow-up of hypertension and diabetes.  Since last visit he has had all his teeth extracted and is doing better from an oral standpoint getting dentures.  The largest complaint today is that of foot  pain on the right.  He does podiatry recommended conservative measures between the fourth and fifth toe on the right which has inflammation and maceration.  He has no foot ulcers.  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  Another issue is that he has erectile dysfunction would like to see urology.  Blood pressure on arrival is 122/74.  A1c is down to 8.9 from 9.9 and blood sugars at home are showing improvement.  He has now been clean from cocaine 12 months.  He did get an eye exam.  There is no retinopathy with the eye exam.  Heart failure has been stable.  11/19/22  Past Medical History:  Diagnosis Date   Cellulitis and abscess of foot 09/29/2016   Chronic systolic heart failure (HCC)    Cocaine abuse (HCC)    Cocaine dependence with cocaine-induced mood disorder (HCC)    Depression    Diabetes mellitus without complication (HCC)    Essential hypertension    Multiple closed fractures of ribs of both sides    NICM (nonischemic cardiomyopathy) (HCC) EF 20-25%    Pneumonia of both lungs due to infectious organism 04/26/2016   Suicidal ideations 07/12/2017    Past Surgical History:  Procedure Laterality Date   CARDIAC CATHETERIZATION N/A 04/30/2016   Procedure: Right/Left Heart Cath and Coronary Angiography;  Surgeon: Yvonne Kendall, MD;  Location: [facility] INVASIVE CV LAB;  Service: Cardiovascular;  Laterality: N/A;   FINGER SURGERY     ICD IMPLANT N/A 12/16/2020   Procedure: ICD IMPLANT;  Surgeon: Lanier Prude, MD;  Location: [facility] INVASIVE CV LAB;  Service: Cardiovascular;  Laterality: N/A;   LEFT HEART CATH AND CORONARY ANGIOGRAPHY N/A 12/12/2020   Procedure: LEFT HEART CATH AND CORONARY ANGIOGRAPHY;  Surgeon: Lennette Bihari, MD;  Location: [facility] INVASIVE CV LAB;  Service: Cardiovascular;  Laterality: N/A;    Family History  Problem Relation Age of Onset   Diabetes Other    Hypertension Other     Diabetes Mother    Brain cancer Father    Mental illness Brother    Drug abuse Brother    Diabetes Brother     Social History   Socioeconomic History   Marital status: Married    Spouse name: Not on file   Number of children: Not on file   Years of education: Not on file   Highest education level: Not on file  Occupational History   Not on file  Tobacco Use   Smoking status: Every Day    Current packs/day: 0.50    Average packs/day: 0.5 packs/day for 23.0 years (11.5 ttl pk-yrs)    Types: Cigarettes   Smokeless tobacco: Never  Tobacco comments:    Every other day   Vaping Use   Vaping status: Never Used  Substance and Sexual Activity   Alcohol use: Not Currently   Drug use: Not Currently    Types: Cocaine, "Crack" cocaine   Sexual activity: Not Currently  Other Topics Concern   Not on file  Social History Narrative   Not on file   Social Determinants of Health   Financial Resource Strain: Not on file  Food Insecurity: Not on file  Transportation Needs: Not on file  Physical Activity: Not on file  Stress: Not on file  Social Connections: Not on file  Intimate Partner Violence: Not on file    Outpatient Medications Prior to Visit  Medication Sig Dispense Refill   Accu-Chek Softclix Lancets lancets Use as instructed 100 each 12   aspirin 81 MG chewable tablet Chew 81 mg by mouth daily. 1Daily     atorvastatin (LIPITOR) 80 MG tablet Take 1 tablet (80 mg total) by mouth daily. 90 tablet 3   blood glucose meter kit and supplies Dispense based on patient and insurance preference. Use up to four times daily as directed. (FOR ICD-9 250.00, 250.01). 1 each 0   Blood Glucose Monitoring Suppl (ACCU-CHEK GUIDE ME) w/Device KIT Use to measure blood sugar twice a day 1 kit 0   Blood Glucose Monitoring Suppl (TRUE METRIX METER) w/Device KIT Use to measure blood sugar twice a day 1 kit 0   carvedilol (COREG) 25 MG tablet Take 1 tablet (25 mg total) by mouth 2 (two) times daily  with a meal. 60 tablet 6   chlorhexidine (PERIDEX) 0.12 % solution RINSE MOUTH WITH (1 CAPFUL) FOR 30 SECONDS AM AND PM AFTER TOOTHBRUSHING. EXPECTORATE AFTER RINSING, DO NOT SWALLOW 473 mL 1   cholecalciferol (VITAMIN D3) 25 MCG (1000 UNIT) tablet Take 1 tablet (1,000 Units total) by mouth daily. 60 tablet 1   Continuous Glucose Receiver (FREESTYLE LIBRE 2 READER) DEVI Use to check blood sugar continuously. Change sensors once every 14 days. 1 each 0   Continuous Glucose Sensor (FREESTYLE LIBRE 2 SENSOR) MISC Use to check blood sugar continuously. Change sensors once every 14 days. 2 each 6   dapagliflozin propanediol (FARXIGA) 10 MG TABS tablet Take 1 tablet (10 mg total) by mouth daily before breakfast. 30 tablet 2   glucose blood (ACCU-CHEK GUIDE) test strip Use as instructed 100 each 12   glucose blood test strip USE TO CHECK BLOOD SUGARS 2 TIMES DAILY 100 strip 12   ibuprofen (IBU) 800 MG tablet Take 1 tablet (800 mg total) by mouth every 6 to 8 hours as needed. 20 tablet 0   insulin glargine (LANTUS SOLOSTAR) 100 UNIT/ML Solostar Pen Inject 30 Units into the skin daily. (Patient taking differently: Inject 16 Units into the skin daily.) 15 mL 4   insulin lispro (HUMALOG) 100 UNIT/ML KwikPen Inject 5 Units into the skin 2 (two) times daily before lunch and supper. Hold if blood sugar is less than 150 15 mL 2   Insulin Pen Needle (TRUEPLUS 5-BEVEL PEN NEEDLES) 32G X 4 MM MISC Use to inject Basaglar once a day. 100 each 1   metFORMIN (GLUCOPHAGE) 1000 MG tablet Take 1 tablet (1,000 mg total) by mouth 2 (two) times daily with a meal. 120 tablet 2   penicillin v potassium (VEETID) 500 MG tablet Take 1 tablet (500 mg total) by mouth 4 (four) times daily until gone. 40 tablet 0   sacubitril-valsartan (ENTRESTO) 24-26  MG Take 1 tablet by mouth 2 (two) times daily. 180 tablet 3   sildenafil (VIAGRA) 100 MG tablet Take 1 tablet (100 mg total) by mouth daily as needed for erectile dysfunction. 10  tablet 1   spironolactone (ALDACTONE) 25 MG tablet Take 0.5 tablets (12.5 mg total) by mouth daily. 30 tablet 4   TRUEplus Lancets 28G MISC Use to measure blood sugar twice a day 100 each 1   No facility-administered medications prior to visit.    No Known Allergies  ROS Review of Systems  Constitutional: Negative.   HENT: Negative.  Negative for ear pain, postnasal drip, rhinorrhea, sinus pressure, sore throat, trouble swallowing and voice change.   Eyes: Negative.   Respiratory: Negative.  Negative for apnea, cough, choking, chest tightness, shortness of breath, wheezing and stridor.   Cardiovascular: Negative.  Negative for chest pain, palpitations and leg swelling.  Gastrointestinal: Negative.  Negative for abdominal distention, abdominal pain, nausea and vomiting.  Endocrine: Negative.   Genitourinary: Negative.        Erectile dysfunction persist despite Cialis  Musculoskeletal: Negative.  Negative for arthralgias and myalgias.       Foot pain and calluses which are unchanged  Skin: Negative.  Negative for rash.  Allergic/Immunologic: Negative.  Negative for environmental allergies and food allergies.  Neurological: Negative.  Negative for dizziness, syncope, weakness and headaches.  Hematological: Negative.  Negative for adenopathy. Does not bruise/bleed easily.  Psychiatric/Behavioral: Negative.  Negative for agitation, dysphoric mood, sleep disturbance and suicidal ideas. The patient is not nervous/anxious.       Objective:    Physical Exam Vitals reviewed.  Constitutional:      General: He is not in acute distress.    Appearance: Normal appearance. He is well-developed. He is not diaphoretic.  HENT:     Head: Normocephalic and atraumatic.     Nose: No nasal deformity, septal deviation, mucosal edema or rhinorrhea.     Right Sinus: No maxillary sinus tenderness or frontal sinus tenderness.     Left Sinus: No maxillary sinus tenderness or frontal sinus tenderness.      Mouth/Throat:     Pharynx: No oropharyngeal exudate.  Eyes:     General: No scleral icterus.    Conjunctiva/sclera: Conjunctivae normal.     Pupils: Pupils are equal, round, and reactive to light.  Neck:     Thyroid: No thyromegaly.     Vascular: No carotid bruit or JVD.     Trachea: Trachea normal. No tracheal tenderness or tracheal deviation.  Cardiovascular:     Rate and Rhythm: Normal rate and regular rhythm.     Chest Wall: PMI is not displaced.     Pulses: Normal pulses. No decreased pulses.     Heart sounds: S1 normal and S2 normal. Heart sounds not distant. Murmur heard.     Systolic (holosystolic, best heard at left lower sternal border) murmur is present.     No diastolic murmur is present.     No friction rub. No gallop. No S3 or S4 sounds.  Pulmonary:     Effort: Pulmonary effort is normal. No tachypnea, accessory muscle usage or respiratory distress.     Breath sounds: Normal breath sounds. No stridor. No decreased breath sounds, wheezing, rhonchi or rales.  Chest:     Chest wall: No tenderness.  Abdominal:     General: Bowel sounds are normal. There is no distension.     Palpations: Abdomen is soft. Abdomen is not rigid.  Tenderness: There is no abdominal tenderness. There is no guarding or rebound.  Musculoskeletal:        General: Normal range of motion.     Cervical back: Normal range of motion and neck supple. No edema, erythema or rigidity. No muscular tenderness. Normal range of motion.     Right lower leg: No edema.     Left lower leg: No edema.     Comments: Calluses on both feet and maceration between the fourth and fifth toe on the right with discomfort upon palpation but no ulcers  Lymphadenopathy:     Head:     Right side of head: No submental or submandibular adenopathy.     Left side of head: No submental or submandibular adenopathy.     Cervical: No cervical adenopathy.  Skin:    General: Skin is warm and dry.     Coloration: Skin is not pale.      Findings: No rash.     Nails: There is no clubbing.     Comments: Bilateral onychomycosis both great toenails  Neurological:     General: No focal deficit present.     Mental Status: He is alert and oriented to person, place, and time.     Sensory: No sensory deficit.  Psychiatric:        Mood and Affect: Mood normal.        Speech: Speech normal.        Behavior: Behavior normal.     There were no vitals taken for this visit. Wt Readings from Last 3 Encounters:  09/14/22 152 lb 12.8 oz (69.3 kg)  08/18/22 155 lb 12.8 oz (70.7 kg)  06/10/22 157 lb (71.2 kg)   CXR 9/8:  NORMAL  Health Maintenance Due  Topic Date Due   Diabetic kidney evaluation - Urine ACR  12/18/2022     There are no preventive care reminders to display for this patient.  Lab Results  Component Value Date   TSH 2.155 01/16/2017   Lab Results  Component Value Date   WBC 3.9 06/10/2022   HGB 13.1 06/10/2022   HCT 40.5 06/10/2022   MCV 85 06/10/2022   PLT 222 06/10/2022   Lab Results  Component Value Date   NA 138 06/10/2022   K 4.3 06/10/2022   CO2 23 06/10/2022   GLUCOSE 194 (H) 06/10/2022   BUN 14 06/10/2022   CREATININE 0.95 06/10/2022   BILITOT 0.2 06/10/2022   ALKPHOS 70 06/10/2022   AST 13 06/10/2022   ALT 11 06/10/2022   PROT 6.7 06/10/2022   ALBUMIN 4.3 06/10/2022   CALCIUM 9.2 06/10/2022   ANIONGAP 9 06/18/2021   EGFR 90 06/10/2022   Lab Results  Component Value Date   CHOL 160 06/10/2022   Lab Results  Component Value Date   HDL 66 06/10/2022   Lab Results  Component Value Date   LDLCALC 83 06/10/2022   Lab Results  Component Value Date   TRIG 55 06/10/2022   Lab Results  Component Value Date   CHOLHDL 2.4 06/10/2022   Lab Results  Component Value Date   HGBA1C 8.9 (A) 09/14/2022      Assessment & Plan:   Problem List Items Addressed This Visit   None  No orders of the defined types were placed in this encounter. 30 minutes spent multiple systems  assessed Follow-up: No follow-ups on file.    Shan Levans, MD

## 2022-11-19 NOTE — Patient Instructions (Signed)
Medications refilled Lab today HgbA1c Second opinion referral to podiatry Return 5 months new Primary Care provider

## 2022-11-20 DIAGNOSIS — K08109 Complete loss of teeth, unspecified cause, unspecified class: Secondary | ICD-10-CM | POA: Insufficient documentation

## 2022-11-20 NOTE — Assessment & Plan Note (Signed)
Compensated 

## 2022-11-20 NOTE — Assessment & Plan Note (Signed)
Referral to urology continue Viagra

## 2022-11-20 NOTE — Assessment & Plan Note (Signed)
Continue with Marcelline Deist changed to extended release metformin continue with short and long-acting insulin  Reassess urine for albumin and metabolic panel A1c

## 2022-11-20 NOTE — Assessment & Plan Note (Signed)
All wounds healed in mouth after total extractions dentures will be produced

## 2022-11-20 NOTE — Assessment & Plan Note (Signed)
Patient would like second opinion referral made

## 2022-11-20 NOTE — Assessment & Plan Note (Signed)
Hypertension and heart failure controlled no change medication refills given

## 2022-11-20 NOTE — Progress Notes (Signed)
Let pt know A1c still high but better than 5 month ago,  luke can you see this patient in follow up for his DM

## 2022-11-20 NOTE — Assessment & Plan Note (Signed)
 Continue with statin check lipid

## 2022-11-22 ENCOUNTER — Encounter: Payer: Self-pay | Admitting: *Deleted

## 2022-11-22 ENCOUNTER — Telehealth: Payer: Self-pay | Admitting: Pharmacist

## 2022-11-22 NOTE — Telephone Encounter (Signed)
Can we schedule this patient with me for a diabetes visit in 1 month?

## 2022-11-23 ENCOUNTER — Telehealth: Payer: Self-pay

## 2022-11-23 NOTE — Telephone Encounter (Signed)
-----   Message from Shan Levans sent at 11/20/2022  6:04 AM EDT ----- Let pt know A1c still high but better than 5 month ago,  luke can you see this patient in follow up for his DM

## 2022-11-23 NOTE — Telephone Encounter (Signed)
Called patient twice unable to hear other party, static noise    Results routed to nurse pool

## 2022-11-24 ENCOUNTER — Other Ambulatory Visit: Payer: Self-pay

## 2022-12-14 ENCOUNTER — Other Ambulatory Visit: Payer: Self-pay

## 2022-12-20 ENCOUNTER — Ambulatory Visit (INDEPENDENT_AMBULATORY_CARE_PROVIDER_SITE_OTHER): Payer: Medicaid Other

## 2022-12-20 DIAGNOSIS — I428 Other cardiomyopathies: Secondary | ICD-10-CM

## 2022-12-20 DIAGNOSIS — Z8674 Personal history of sudden cardiac arrest: Secondary | ICD-10-CM

## 2022-12-20 LAB — CUP PACEART REMOTE DEVICE CHECK
Battery Remaining Longevity: 125 mo
Battery Voltage: 3.01 V
Brady Statistic RV Percent Paced: 0.01 %
Date Time Interrogation Session: 20240909033526
HighPow Impedance: 58 Ohm
Implantable Lead Connection Status: 753985
Implantable Lead Implant Date: 20220906
Implantable Lead Location: 753860
Implantable Pulse Generator Implant Date: 20220906
Lead Channel Impedance Value: 228 Ohm
Lead Channel Impedance Value: 285 Ohm
Lead Channel Pacing Threshold Amplitude: 0.75 V
Lead Channel Pacing Threshold Pulse Width: 0.4 ms
Lead Channel Sensing Intrinsic Amplitude: 8.5 mV
Lead Channel Sensing Intrinsic Amplitude: 8.5 mV
Lead Channel Setting Pacing Amplitude: 2 V
Lead Channel Setting Pacing Pulse Width: 0.4 ms
Lead Channel Setting Sensing Sensitivity: 0.3 mV
Zone Setting Status: 755011
Zone Setting Status: 755011

## 2022-12-24 ENCOUNTER — Ambulatory Visit: Payer: Medicaid Other | Admitting: Podiatry

## 2023-01-03 NOTE — Progress Notes (Signed)
Remote ICD transmission.   

## 2023-01-13 ENCOUNTER — Other Ambulatory Visit: Payer: Self-pay

## 2023-01-25 ENCOUNTER — Other Ambulatory Visit: Payer: Self-pay

## 2023-03-14 ENCOUNTER — Other Ambulatory Visit: Payer: Self-pay | Admitting: Critical Care Medicine

## 2023-03-14 ENCOUNTER — Other Ambulatory Visit: Payer: Self-pay

## 2023-03-14 MED ORDER — FREESTYLE LIBRE 2 READER DEVI
0 refills | Status: AC
  Filled 2023-03-14: qty 1, 30d supply, fill #0

## 2023-03-14 MED ORDER — ACCU-CHEK GUIDE TEST VI STRP
ORAL_STRIP | 6 refills | Status: AC
  Filled 2023-03-14: qty 100, 33d supply, fill #0

## 2023-03-21 ENCOUNTER — Ambulatory Visit (INDEPENDENT_AMBULATORY_CARE_PROVIDER_SITE_OTHER): Payer: Medicaid Other

## 2023-03-21 DIAGNOSIS — Z8674 Personal history of sudden cardiac arrest: Secondary | ICD-10-CM

## 2023-03-21 DIAGNOSIS — I428 Other cardiomyopathies: Secondary | ICD-10-CM

## 2023-03-22 LAB — CUP PACEART REMOTE DEVICE CHECK
Battery Remaining Longevity: 123 mo
Battery Voltage: 3 V
Brady Statistic RV Percent Paced: 0 %
Date Time Interrogation Session: 20241210142605
HighPow Impedance: 74 Ohm
Implantable Lead Connection Status: 753985
Implantable Lead Implant Date: 20220906
Implantable Lead Location: 753860
Implantable Pulse Generator Implant Date: 20220906
Lead Channel Impedance Value: 228 Ohm
Lead Channel Impedance Value: 304 Ohm
Lead Channel Pacing Threshold Amplitude: 0.625 V
Lead Channel Pacing Threshold Pulse Width: 0.4 ms
Lead Channel Sensing Intrinsic Amplitude: 12.25 mV
Lead Channel Sensing Intrinsic Amplitude: 12.25 mV
Lead Channel Setting Pacing Amplitude: 2 V
Lead Channel Setting Pacing Pulse Width: 0.4 ms
Lead Channel Setting Sensing Sensitivity: 0.3 mV
Zone Setting Status: 755011
Zone Setting Status: 755011

## 2023-03-23 ENCOUNTER — Other Ambulatory Visit: Payer: Self-pay

## 2023-04-22 ENCOUNTER — Ambulatory Visit: Payer: Medicaid Other | Attending: Internal Medicine | Admitting: Internal Medicine

## 2023-05-13 ENCOUNTER — Encounter: Payer: Self-pay | Admitting: General Practice

## 2023-06-11 ENCOUNTER — Emergency Department (HOSPITAL_COMMUNITY)

## 2023-06-11 ENCOUNTER — Emergency Department (HOSPITAL_COMMUNITY)
Admission: EM | Admit: 2023-06-11 | Discharge: 2023-06-12 | Attending: Emergency Medicine | Admitting: Emergency Medicine

## 2023-06-11 ENCOUNTER — Other Ambulatory Visit: Payer: Self-pay

## 2023-06-11 ENCOUNTER — Encounter (HOSPITAL_COMMUNITY): Payer: Self-pay | Admitting: Emergency Medicine

## 2023-06-11 DIAGNOSIS — L02414 Cutaneous abscess of left upper limb: Secondary | ICD-10-CM | POA: Insufficient documentation

## 2023-06-11 DIAGNOSIS — I110 Hypertensive heart disease with heart failure: Secondary | ICD-10-CM | POA: Insufficient documentation

## 2023-06-11 DIAGNOSIS — I5022 Chronic systolic (congestive) heart failure: Secondary | ICD-10-CM | POA: Diagnosis not present

## 2023-06-11 DIAGNOSIS — Z794 Long term (current) use of insulin: Secondary | ICD-10-CM | POA: Insufficient documentation

## 2023-06-11 DIAGNOSIS — E1165 Type 2 diabetes mellitus with hyperglycemia: Secondary | ICD-10-CM | POA: Diagnosis not present

## 2023-06-11 DIAGNOSIS — Z79899 Other long term (current) drug therapy: Secondary | ICD-10-CM | POA: Insufficient documentation

## 2023-06-11 DIAGNOSIS — Z7984 Long term (current) use of oral hypoglycemic drugs: Secondary | ICD-10-CM | POA: Diagnosis not present

## 2023-06-11 DIAGNOSIS — L0291 Cutaneous abscess, unspecified: Secondary | ICD-10-CM

## 2023-06-11 NOTE — ED Triage Notes (Signed)
 Pt in from [facility] via [facility] with wound to L outer wrist, states he thinks he got bit by a spider in his jail cell on 2/14. Pt states wound began as a blister and it popped and started draining a few days ago. Forearm warm and swollen

## 2023-06-12 LAB — CBG MONITORING, ED: Glucose-Capillary: 296 mg/dL — ABNORMAL HIGH (ref 70–99)

## 2023-06-12 MED ORDER — DOXYCYCLINE HYCLATE 100 MG PO CAPS: 100.0000 mg | ORAL_CAPSULE | Freq: Two times a day (BID) | ORAL | 0 refills | Status: DC

## 2023-06-12 MED ORDER — DOXYCYCLINE HYCLATE 100 MG PO TABS
100.0000 mg | ORAL_TABLET | Freq: Once | ORAL | Status: AC
  Administered 2023-06-12: 100 mg via ORAL
  Filled 2023-06-12: qty 1

## 2023-06-12 NOTE — ED Provider Notes (Signed)
 [place] [facility] DEPARTMENT AT [facility] Provider Note   CSN: [id number] Arrival date & time: 06/11/23  2227     History  Chief Complaint  Patient presents with   Wound Infection    James [name]. is a 63 y.o. male.  The history is provided by the patient.  Patient with extensive history including diabetes, hypertension, nonischemic cardiomyopathy presents with abscess to his left wrist.  He reports for over 2 weeks he has had a lesion on his left wrist that is now draining.  He is currently at the [facility] jail.  He was sent in for evaluation.  No fevers or vomiting. He reports he has his blood glucose checked multiple times a day at the jail He has had this previously on various parts of his body that has responded to antibiotics Patient reports that he suspects a spider bit him    Past Medical History:  Diagnosis Date   Cellulitis and abscess of foot 09/29/2016   Chronic systolic heart failure (HCC)    Cocaine abuse (HCC)    Cocaine dependence with cocaine-induced mood disorder (HCC)    Depression    Diabetes mellitus without complication (HCC)    Essential hypertension    Multiple closed fractures of ribs of both sides    NICM (nonischemic cardiomyopathy) (HCC) EF 20-25%    Pneumonia of both lungs due to infectious organism 04/26/2016   Suicidal ideations 07/12/2017    Home Medications Prior to Admission medications   Medication Sig Start Date End Date Taking? Authorizing Provider  doxycycline (VIBRAMYCIN) 100 MG capsule Take 1 capsule (100 mg total) by mouth 2 (two) times daily. One po bid x 7 days 06/12/23  Yes Zadie Rhine, MD  Accu-Chek Softclix Lancets lancets Use as instructed 06/25/22   Storm Frisk, MD  aspirin 81 MG chewable tablet Chew 81 mg by mouth daily. 1Daily    [provider]  atorvastatin (LIPITOR) 80 MG tablet Take 1 tablet (80 mg total) by mouth daily. 11/19/22   Storm Frisk, MD  blood glucose meter kit  and supplies Dispense based on patient and insurance preference. Use up to four times daily as directed. (FOR ICD-9 250.00, 250.01). 07/17/19   Mayers, Cari S, PA-C  Blood Glucose Monitoring Suppl (ACCU-CHEK GUIDE ME) w/Device KIT Use to measure blood sugar twice a day 06/25/22   Storm Frisk, MD  Blood Glucose Monitoring Suppl (TRUE METRIX METER) w/Device KIT Use to measure blood sugar twice a day 04/24/21   Storm Frisk, MD  carvedilol (COREG) 25 MG tablet Take 1 tablet (25 mg total) by mouth 2 (two) times daily with a meal. 11/19/22   Storm Frisk, MD  chlorhexidine (PERIDEX) 0.12 % solution RINSE MOUTH WITH (1 CAPFUL) FOR 30 SECONDS AM AND PM AFTER TOOTHBRUSHING. EXPECTORATE AFTER RINSING, DO NOT SWALLOW 07/16/22     cholecalciferol (VITAMIN D3) 25 MCG (1000 UNIT) tablet Take 1 tablet (1,000 Units total) by mouth daily. 11/19/22   Storm Frisk, MD  Continuous Glucose Receiver (FREESTYLE LIBRE 2 READER) DEVI Use to check blood sugar continuously. Change sensors once every 14 days. 03/14/23   Hoy Register, MD  Continuous Glucose Sensor (FREESTYLE LIBRE 2 SENSOR) MISC Use to check blood sugar continuously. Change sensors once every 14 days. 11/19/22   Storm Frisk, MD  dapagliflozin propanediol (FARXIGA) 10 MG TABS tablet Take 1 tablet (10 mg total) by mouth daily before breakfast. 11/19/22   Delford Field,  Charlcie Cradle, MD  glucose blood (ACCU-CHEK GUIDE TEST) test strip Use to check blood sugar three times daily. 03/14/23   Hoy Register, MD  glucose blood (ACCU-CHEK GUIDE) test strip Use as instructed 06/25/22   Storm Frisk, MD  ibuprofen (IBU) 800 MG tablet Take 1 tablet (800 mg total) by mouth every 6 to 8 hours as needed. 07/21/22     insulin glargine (LANTUS SOLOSTAR) 100 UNIT/ML Solostar Pen Inject 30 Units into the skin daily. Patient taking differently: Inject 14 Units into the skin daily. 06/10/22   Storm Frisk, MD  insulin lispro (HUMALOG) 100 UNIT/ML KwikPen Inject 5  Units into the skin 2 (two) times daily before lunch and supper. Hold if blood sugar is less than 150 11/19/22   Storm Frisk, MD  Insulin Pen Needle (TRUEPLUS 5-BEVEL PEN NEEDLES) 32G X 4 MM MISC Use to inject Basaglar once a day. 11/19/22   Storm Frisk, MD  metFORMIN (GLUCOPHAGE-XR) 500 MG 24 hr tablet Take 2 tablets (1,000 mg total) by mouth daily with breakfast. 11/19/22   Storm Frisk, MD  sacubitril-valsartan (ENTRESTO) 24-26 MG Take 1 tablet by mouth 2 (two) times daily. 11/19/22   Storm Frisk, MD  sildenafil (VIAGRA) 100 MG tablet Take 1 tablet (100 mg total) by mouth daily as needed for erectile dysfunction. 11/19/22   Storm Frisk, MD  spironolactone (ALDACTONE) 25 MG tablet Take 0.5 tablets (12.5 mg total) by mouth daily. 11/19/22   Storm Frisk, MD  TRUEplus Lancets 28G MISC Use to measure blood sugar twice a day 12/17/21   Storm Frisk, MD      Allergies    Patient has no known allergies.    Review of Systems   Review of Systems  Constitutional:  Negative for fever.  Gastrointestinal:  Negative for vomiting.    Physical Exam Updated Vital Signs BP (!) 148/90   Pulse 87   Temp 98.4 F (36.9 C)   Resp 18   Wt 69.2 kg   SpO2 100%   BMI 20.69 kg/m  Physical Exam CONSTITUTIONAL: Well developed/well nourished No distress eating graham crackers, right arm is handcuffed to the bed Low enforcement present at bedside HEAD: Normocephalic/atraumatic EYES: EOMI/PERRL NEURO: Pt is awake/alert/appropriate, moves all extremitiesx4.  No facial droop.  Patient is able to make a fist with his left hand without difficulty, EXTREMITIES: pulses normal/equal, full ROM See photo Minimal fluctuance noted to the lesion on his left wrist.  No crepitus, no active drainage.  There is no streaking distally or proximally. He has full range of motion of his left wrist without difficulty SKIN: See photo   ED Results / Procedures / Treatments   Labs (all labs ordered  are listed, but only abnormal results are displayed) Labs Reviewed  CBG MONITORING, ED - Abnormal; Notable for the following components:      Result Value   Glucose-Capillary 296 (*)    All other components within normal limits    EKG None  Radiology DG Wrist Complete Left Result Date: 06/11/2023 CLINICAL DATA:  L wrist wound Pt in from jail via [facility] with wound to L outer wrist, states he thinks he got bit by a spider in his jail cell on 2/14. Pt states wound began as a blister and it popped and started draining a few days ago. Forearm warm and swollen. EXAM: LEFT WRIST - COMPLETE 3+ VIEW COMPARISON:  None Available. FINDINGS: There is no evidence of fracture  or dislocation. Well corticated dorsal wrist ossific density posterior to the lunate and scaphoid consistent suggestive of an old healed triquetral fracture. There is no evidence of arthropathy or other focal bone abnormality. Wrist subcutaneus soft tissue edema. No retained radiopaque foreign body. IMPRESSION: 1. No acute displaced fracture or dislocation. 2. No retained radiopaque foreign body. Electronically Signed   By: Tish Frederickson M.D.   On: 06/11/2023 23:32    Procedures Procedures    Medications Ordered in ED Medications  doxycycline (VIBRA-TABS) tablet 100 mg (100 mg Oral Given 06/12/23 0129)    ED Course/ Medical Decision Making/ A&P                                 Medical Decision Making Amount and/or Complexity of Data Reviewed Radiology: ordered.  Risk Prescription drug management.   This patient presents to the ED for concern of infection of left wrist, this involves an extensive number of treatment options, and is a complaint that carries with it a high risk of complications and morbidity.  The differential diagnosis includes but is not limited to septic joint, cellulitis, abscess, necrotizing fasciitis  Comorbidities that complicate the patient evaluation: Patient's presentation is complicated  by their history of diabetes  Social Determinants of Health: Patient's  currently incarcerated   increases the complexity of managing their presentation  Additional history obtained: Additional history obtained from  law enforcement  Lab Tests: I Ordered, and personally interpreted labs.  The pertinent results include: Hyperglycemia  Imaging Studies ordered: I ordered imaging studies including X-ray left wrist   I independently visualized and interpreted imaging which showed no acute findings I agree with the radiologist interpretation  Medicines ordered and prescription drug management: I ordered medication including doxycycline for abscess  Test Considered: Further workup is considered, patient is overall well-appearing.  He is eating crackers in no distress.  He is not vomiting, he does not smell of ketones Low suspicion for DKA.  He is very low risk for sepsis at this time given his overall clinical appearance Defer further imaging or labs  Complexity of problems addressed: Patient's presentation is most consistent with  acute complicated illness/injury requiring diagnostic workup  Disposition: After consideration of the diagnostic results and the patient's response to treatment,  I feel that the patent would benefit from discharge   .    Patient will need wound care, frequent checks of his glucose and doxycycline for a week.  At this time he does not require IV antibiotics or admission       Final Clinical Impression(s) / ED Diagnoses Final diagnoses:  Abscess    Rx / DC Orders ED Discharge Orders          Ordered    doxycycline (VIBRAMYCIN) 100 MG capsule  2 times daily        06/12/23 0129              Zadie Rhine, MD 06/12/23 [phone]

## 2023-08-09 NOTE — Progress Notes (Unsigned)
 Cardiology Office Note:  .   Date:  08/09/2023  ID:  Forde Idler., DOB [birth year], MRN [id number] PCP: Vernell Goldsmith, MD  [facility] Providers Cardiologist:  Alexandria Angel, MD Electrophysiologist:  Boyce Byes, MD {  History of Present Illness: .   James [name]. is a 63 y.o. 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 Marven Slimmer  He saw Dr. Audery Blazing 08/18/22, no changes in meds, noted hx of renal lesion, recs for f/u PMD, CT planned   Today's visit is scheduled as an ICD visit ROS:   He comes escrorted by Biochemist, clinical.  The patient denies any cardiac concerns Had been a long time since he was in for a device check and wanted to get back on track with it No device concerns No shocks  He gets some exercise in, as much as he can do while incarcerated, denies CP, palpitations cardiac awareness No SOB No near syncope or syncope  He says he has had labs done via the clinic there   Device information MDT single chamber ICD implanted 12/16/20 Secondary prevention   Studies Reviewed: Aaron Aas    EKG done today and reviewed by myself:  SR 76bpm, PAC, PVC Rhythm strip with SR, some PACs and PVCs   DEVICE interrogation done today and reviewed by myself Battery and lead measurements are good + NSVTs, not new for him (14 total since Nov 2023) Episodes labeled AF suspect are ectopy, not true AF all are brief B>AX where needed  06/01/22: TTE  1. Left ventricular ejection fraction, by estimation, is 55%. The left  ventricle has normal function. The left ventricle has no regional wall  motion abnormalities. There is mild concentric left ventricular  hypertrophy. Left ventricular diastolic  parameters are consistent with Grade I diastolic dysfunction (impaired  relaxation). The average left ventricular global longitudinal strain is  -20.6 %. The global longitudinal strain is normal.   2. Right  ventricular systolic function is normal. The right ventricular  size is normal. There is normal pulmonary artery systolic pressure. The  estimated right ventricular systolic pressure is 28.6 mmHg.   3. The mitral valve is normal in structure. No evidence of mitral valve  regurgitation. No evidence of mitral stenosis.   4. The aortic valve is tricuspid. Aortic valve regurgitation is not  visualized. No aortic stenosis is present.   5. The inferior vena cava is normal in size with <50% respiratory  variability, suggesting right atrial pressure of 8 mmHg.   Cardiac MRI September 2022 showed ejection fraction 32%, mild RV dysfunction and findings consistent with nonischemic cardiomyopathy.  Cardiac catheterization September 2022 showed 50% mid LAD. Had ICD implanted September 2022.   Cardiac catheterization January 2018 showed 50% LAD but otherwise no obstructive coronary disease    Risk Assessment/Calculations:    Physical Exam:   VS:  There were no vitals taken for this visit.   Wt Readings from Last 3 Encounters:  11/19/22 152 lb 9.6 oz (69.2 kg)  09/14/22 152 lb 12.8 oz (69.3 kg)  08/18/22 155 lb 12.8 oz (70.7 kg)    GEN: Well nourished, well developed in no acute distress NECK: No JVD; No carotid bruits CARDIAC: RRR, no murmurs, rubs, gallops RESPIRATORY:  CTA b/l without rales, wheezing or rhonchi  ABDOMEN: Soft, non-tender, non-distended EXTREMITIES: No edema; No deformity   ICD site: is stable, no thinning, fluctuation, tethering  ASSESSMENT AND PLAN: .    ICD intact function B>AX where needed  NICM Recovered LVEF by his echo 2024 OptiVol had been way up though trending down No symptoms or exam findings to support volume OL He is over due to see Dr. Leeland Pugh Officer informs me that the detention [facility] clinic will call to make an appointment.  C/w Dr. Leeland Pugh   ADDEND: spoke to clinic nurse [phone] No labs there, plan for clinic visit with Dr.  Christel Cousins next available to catch him up and get labs doe They are unable to do device remotes Will have him see EP in 3 mo My MA made the appts with the clinic RN via phone    Dispo: remotes as usual, in clinic in   Signed, Juliya Magill Randol Butt, PA-C

## 2023-08-10 ENCOUNTER — Ambulatory Visit: Attending: Physician Assistant | Admitting: Physician Assistant

## 2023-08-10 ENCOUNTER — Encounter: Payer: Self-pay | Admitting: Physician Assistant

## 2023-08-10 VITALS — BP 130/82 | HR 76 | Ht 72.0 in | Wt 165.2 lb

## 2023-08-10 DIAGNOSIS — I5022 Chronic systolic (congestive) heart failure: Secondary | ICD-10-CM

## 2023-08-10 DIAGNOSIS — Z95810 Presence of automatic (implantable) cardiac defibrillator: Secondary | ICD-10-CM

## 2023-08-10 DIAGNOSIS — I428 Other cardiomyopathies: Secondary | ICD-10-CM | POA: Diagnosis not present

## 2023-08-10 LAB — CUP PACEART INCLINIC DEVICE CHECK
Battery Remaining Longevity: 116 mo
Battery Voltage: 3 V
Brady Statistic RV Percent Paced: 0 %
Date Time Interrogation Session: 20250430145419
HighPow Impedance: 64 Ohm
Implantable Lead Connection Status: 753985
Implantable Lead Implant Date: 20220906
Implantable Lead Location: 753860
Implantable Pulse Generator Implant Date: 20220906
Lead Channel Impedance Value: 285 Ohm
Lead Channel Impedance Value: 304 Ohm
Lead Channel Pacing Threshold Amplitude: 0.75 V
Lead Channel Pacing Threshold Pulse Width: 0.4 ms
Lead Channel Sensing Intrinsic Amplitude: 9.125 mV
Lead Channel Sensing Intrinsic Amplitude: 9.375 mV
Lead Channel Setting Pacing Amplitude: 2 V
Lead Channel Setting Pacing Pulse Width: 0.4 ms
Lead Channel Setting Sensing Sensitivity: 0.3 mV
Zone Setting Status: 755011
Zone Setting Status: 755011

## 2023-08-10 NOTE — Patient Instructions (Addendum)
 Medication Instructions:    *If you need a refill on your cardiac medications before your next appointment, please call your pharmacy*  Lab Work:   If you have labs (blood work) drawn today and your tests are completely normal, you will receive your results only by: MyChart Message (if you have MyChart) OR A paper copy in the mail If you have any lab test that is abnormal or we need to change your treatment, we will call you to review the results.  Testing/Procedures:    Follow-Up: At [facility], you and your health needs are our priority.  As part of our continuing mission to provide you with exceptional heart care, our providers are all part of one team.  This team includes your primary Cardiologist (physician) and Advanced Practice Providers or APPs (Physician Assistants and Nurse Practitioners) who all work together to provide you with the care you need, when you need it.  Your next appointment:  DR CRENSHAW NEXT AVAILABLE  OVER DUE   1 year(s)  Provider:    You may see Boyce Byes, MD or one of the following Advanced Practice Providers on your designated Care Team:   Mertha Abrahams, [place]   We recommend signing up for the patient portal called "MyChart".  Sign up information is provided on this After Visit Summary.  MyChart is used to connect with patients for Virtual Visits (Telemedicine).  Patients are able to view lab/test results, encounter notes, upcoming appointments, etc.  Non-urgent messages can be sent to your provider as well.   To learn more about what you can do with MyChart, go to [email or web address].   Other Instructions

## 2023-08-14 ENCOUNTER — Encounter: Payer: Self-pay | Admitting: Cardiology

## 2023-09-22 NOTE — Progress Notes (Signed)
 Cardiology Office Note:  .   Date:  09/23/2023  ID:  James Idler., DOB November 01, 1960, MRN [id number] PCP: Vernell Goldsmith, MD  [facility] Providers Cardiologist:  Alexandria Angel, MD Electrophysiologist:  Boyce Byes, MD {  History of Present Illness: .   James Liberto. is a 63 y.o. 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  Only had moderate 50% LAD stenosis Echo 12/2020 EF 40 to 45% Suffered VF cardiac arrest 11/2020.  Shocked 7 times.  Heart cath with unchanged 50% stenosis.  Cardiac MRI with no infiltrative disease, confirming nonischemic cardiomyopathy. Now status post ICD implant Most recent echocardiogram 05/2022 EF has now normalized 55% with normal RV.  No significant valvular disease.     Cardiology follows patient for history of nonischemic cardiomyopathy with recovered EF since February 2024.  Had VF arrest with ICD now in 12/2020 with only 50% stenosis in LAD.  He saw EP April 2025 but has been overdue to see general cardiology.  Today is accompanied with detention officier.  Overall he has no significant complaints.  He reported some nighttime shortness of breath but no exertional shortness of breath.  Has no issues with swelling or chest pain.  He reports he has really been focusing on his health and has been clean since at least December 2024 however he did relapse at that time.  Otherwise he is compliant with all of his medications.  He reports having poor diet due to jail food.  Otherwise in a very positive and cheerful mood.   ROS: Denies: Chest pain, shortness of breath, orthopnea, peripheral edema, palpitations, decreased exercise intolerance, fatigue,  lightheadedness.   Studies Reviewed: .         Risk Assessment/Calculations:             Physical Exam:   VS:  BP 133/83   Pulse 83   Ht 6' (1.829 m)   Wt 168 lb (76.2 kg)   SpO2 99%   BMI 22.78 kg/m    Wt Readings from Last 3 Encounters:  09/23/23 168 lb (76.2 kg)  08/10/23 165 lb 3.2 oz (74.9 kg)  11/19/22 152 lb 9.6 oz (69.2 kg)    GEN: Well nourished, well developed in no acute distress NECK: No JVD; No carotid bruits CARDIAC: RRR, no murmurs, rubs, gallops RESPIRATORY:  Clear to auscultation without rales, wheezing or rhonchi  ABDOMEN: Soft, non-tender, non-distended EXTREMITIES:  No edema; No deformity   ASSESSMENT AND PLAN: .     Nonischemic cardiomyopathy with recovered EF -LHC/Cardiac MRI from 12/2020 unremarkable Most recent echocardiogram February 2024 showing normalization with preserved biventricular function and no significant valvular disease.  Overall well compensated and asymptomatic.  Looks euvolemic on today's exam. GDMT: Continue with Farxiga  10 mg, carvedilol  25 mg twice daily, Entresto  24-26 mg twice daily, spironolactone  12.5 mg daily.  Does not require diuretic.  Nonobstructive CAD Heart catheterization 12/2020 showing unchanged 50% stenosis in LAD.  Has no anginal complaints, stable disease. Continue with aspirin  and atorvastatin  80 mg daily.  Will check lipid panel today.  VF arrest with ICD Etiology not clear.  Heart cath with no significant CAD.  UDS reportedly negative during that time.  Followed by EP.  Type  2 diabetes A1c 9% 9 months ago and uncontrolled.  We will check A1c today  Hx of cocaine abuse Reports being sober however did have relapse in December 2024.  Hyperlipidemia LDL 83 1 year ago.  Repeat lipid panel today    Dispo: Follow-up in 1 year.  Recommend he also have some form of primary care.  Getting routine labs today CBC, BMP, A1c, lipid panel.  Signed, James Cart, PA-C

## 2023-09-23 ENCOUNTER — Ambulatory Visit: Attending: Physician Assistant | Admitting: Cardiology

## 2023-09-23 ENCOUNTER — Encounter: Payer: Self-pay | Admitting: Physician Assistant

## 2023-09-23 VITALS — BP 133/83 | HR 83 | Ht 72.0 in | Wt 168.0 lb

## 2023-09-23 DIAGNOSIS — Z8674 Personal history of sudden cardiac arrest: Secondary | ICD-10-CM | POA: Diagnosis not present

## 2023-09-23 DIAGNOSIS — I2510 Atherosclerotic heart disease of native coronary artery without angina pectoris: Secondary | ICD-10-CM

## 2023-09-23 DIAGNOSIS — I10 Essential (primary) hypertension: Secondary | ICD-10-CM

## 2023-09-23 DIAGNOSIS — Z0000 Encounter for general adult medical examination without abnormal findings: Secondary | ICD-10-CM | POA: Diagnosis not present

## 2023-09-23 DIAGNOSIS — Z95810 Presence of automatic (implantable) cardiac defibrillator: Secondary | ICD-10-CM

## 2023-09-23 DIAGNOSIS — I428 Other cardiomyopathies: Secondary | ICD-10-CM

## 2023-09-23 DIAGNOSIS — E7800 Pure hypercholesterolemia, unspecified: Secondary | ICD-10-CM

## 2023-09-23 NOTE — Patient Instructions (Signed)
 Medication Instructions:  NO CHANGES *If you need a refill on your cardiac medications before your next appointment, please call your pharmacy*  Lab Work: CBC,BMET,FASTING LIPID PANEL AND A1C TODAY If you have labs (blood work) drawn today and your tests are completely normal, you will receive your results only by: MyChart Message (if you have MyChart) OR A paper copy in the mail If you have any lab test that is abnormal or we need to change your treatment, we will call you to review the results.  Testing/Procedures: NO TESTING  Follow-Up: At [facility], you and your health needs are our priority.  As part of our continuing mission to provide you with exceptional heart care, our providers are all part of one team.  This team includes your primary Cardiologist (physician) and Advanced Practice Providers or APPs (Physician Assistants and Nurse Practitioners) who all work together to provide you with the care you need, when you need it.  Your next appointment:   1 year(s)  Provider:   Alexandria Angel, MD

## 2023-09-24 LAB — BASIC METABOLIC PANEL WITH GFR
BUN/Creatinine Ratio: 16 (ref 10–24)
BUN: 16 mg/dL (ref 8–27)
CO2: 23 mmol/L (ref 20–29)
Calcium: 9.6 mg/dL (ref 8.6–10.2)
Chloride: 102 mmol/L (ref 96–106)
Creatinine, Ser: 1.01 mg/dL (ref 0.76–1.27)
Glucose: 112 mg/dL — ABNORMAL HIGH (ref 70–99)
Potassium: 5 mmol/L (ref 3.5–5.2)
Sodium: 142 mmol/L (ref 134–144)
eGFR: 83 mL/min/{1.73_m2} (ref >59)

## 2023-09-24 LAB — CBC
Hematocrit: 39.8 % (ref 37.5–51.0)
Hemoglobin: 12.9 g/dL — ABNORMAL LOW (ref 13.0–17.7)
MCH: 28.4 pg (ref 26.6–33.0)
MCHC: 32.4 g/dL (ref 31.5–35.7)
MCV: 88 fL (ref 79–97)
Platelets: 215 10*3/uL (ref 150–450)
RBC: 4.55 x10E6/uL (ref 4.14–5.80)
RDW: 12.8 % (ref 11.6–15.4)
WBC: 3.7 10*3/uL (ref 3.4–10.8)

## 2023-09-29 ENCOUNTER — Ambulatory Visit: Payer: Self-pay | Admitting: Cardiology

## 2023-10-20 NOTE — Telephone Encounter (Signed)
 Spoke with medical staff at [facility] (direct number [phone], fax number [phone]). Pt has not had any labs drawn by their staff recently. They will accept a faxed order.   Pt also has an upcoming appointment at [facility] office on 11/09/23. Will defer to provider whether lipid panel and A1c need to be drawn sooner than that.

## 2023-11-06 NOTE — Progress Notes (Unsigned)
 Cardiology Office Note:  .   Date:  11/06/2023  ID:  James [name]., DOB December 16, 1960, MRN [id number] PCP: James Belvie BRAVO, MD  [facility] Providers Cardiologist:  James Shallow, MD Electrophysiologist:  James ONEIDA HOLTS, MD {  History of Present Illness: .   James [name]. is a 63 y.o. 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 HOLTS  He saw Dr. Shallow 08/18/22, no changes in meds, noted hx of renal lesion, recs for f/u PMD, CT planned  I saw him 08/10/23 He comes escrorted by detention officer. The patient denies any cardiac concerns Had been a long time since he was in for a device check and wanted to get back on track with it No device concerns No shocks He gets some exercise in, as much as he can do while incarcerated, denies CP, palpitations cardiac awareness No SOB No near syncope or syncope He says he has had labs done via the clinic there Device suggested volume, no symptoms or exam findings of volume OL Planned to get him caught back up with gen cards team 81mo put in place to ensure f/u  Saw cards APP on 09/23/23, doing well, planned for labs Needed primary care.   Today's visit is scheduled as a planned f/u ROS:   *** labs today vis gen cards... ordered but not drawn in June *** volume *** device, auto numbers/volume only *** remotes in jail? *** 6 mo if not  Device information MDT single chamber ICD implanted 12/16/20 Secondary prevention   Studies Reviewed: SABRA    EKG not done today   DEVICE interrogation done today and reviewed by myself Battery and lead measurements are good + NSVTs, not new for him (14 total since Nov 2023) Episodes labeled AF suspect are ectopy, not true AF all are brief B>AX where needed  06/01/22: TTE  1. Left ventricular ejection fraction, by estimation, is 55%. The left  ventricle has normal function. The left ventricle has no regional wall   motion abnormalities. There is mild concentric left ventricular  hypertrophy. Left ventricular diastolic  parameters are consistent with Grade I diastolic dysfunction (impaired  relaxation). The average left ventricular global longitudinal strain is  -20.6 %. The global longitudinal strain is normal.   2. Right ventricular systolic function is normal. The right ventricular  size is normal. There is normal pulmonary artery systolic pressure. The  estimated right ventricular systolic pressure is 28.6 mmHg.   3. The mitral valve is normal in structure. No evidence of mitral valve  regurgitation. No evidence of mitral stenosis.   4. The aortic valve is tricuspid. Aortic valve regurgitation is not  visualized. No aortic stenosis is present.   5. The inferior vena cava is normal in size with <50% respiratory  variability, suggesting right atrial pressure of 8 mmHg.   Cardiac MRI September 2022 showed ejection fraction 32%, mild RV dysfunction and findings consistent with nonischemic cardiomyopathy.  Cardiac catheterization September 2022 showed 50% mid LAD. Had ICD implanted September 2022.   Cardiac catheterization January 2018 showed 50% LAD but otherwise no obstructive coronary disease    Risk Assessment/Calculations:    Physical Exam:   VS:  There were no vitals taken for this visit.   Wt Readings from Last 3 Encounters:  09/23/23 168 lb (76.2 kg)  08/10/23 165 lb 3.2 oz (74.9 kg)  11/19/22 152 lb 9.6 oz (69.2 kg)  GEN: Well nourished, well developed in no acute distress NECK: No JVD; No carotid bruits CARDIAC: *** RRR, no murmurs, rubs, gallops RESPIRATORY: *** CTA b/l without rales, wheezing or rhonchi  ABDOMEN: Soft, non-tender, non-distended EXTREMITIES: ***No edema; No deformity   ***ICD site: is stable, no thinning, fluctuation, tethering  ASSESSMENT AND PLAN: .    ICD *** intact function *** no programming changes made  NICM Recovered LVEF by his echo  2024 OptiVol had been way up though trending down No symptoms or exam findings to support volume OL *** C/w Dr. Harley     Dispo: ***   Signed, Charlies Macario Arthur, PA-C

## 2023-11-09 ENCOUNTER — Ambulatory Visit: Admitting: Cardiology

## 2023-11-09 ENCOUNTER — Ambulatory Visit: Attending: Cardiology | Admitting: Physician Assistant

## 2023-11-09 ENCOUNTER — Encounter: Payer: Self-pay | Admitting: Physician Assistant

## 2023-11-09 VITALS — BP 124/78 | HR 82 | Ht 72.0 in | Wt 173.0 lb

## 2023-11-09 DIAGNOSIS — I428 Other cardiomyopathies: Secondary | ICD-10-CM

## 2023-11-09 DIAGNOSIS — Z95810 Presence of automatic (implantable) cardiac defibrillator: Secondary | ICD-10-CM

## 2023-11-09 LAB — CUP PACEART INCLINIC DEVICE CHECK
Battery Remaining Longevity: 111 mo
Battery Voltage: 3 V
Brady Statistic RV Percent Paced: 0 %
Date Time Interrogation Session: 20250730170046
HighPow Impedance: 66 Ohm
Implantable Lead Connection Status: 753985
Implantable Lead Implant Date: 20220906
Implantable Lead Location: 753860
Implantable Pulse Generator Implant Date: 20220906
Lead Channel Impedance Value: 247 Ohm
Lead Channel Impedance Value: 342 Ohm
Lead Channel Pacing Threshold Amplitude: 0.625 V
Lead Channel Pacing Threshold Pulse Width: 0.4 ms
Lead Channel Sensing Intrinsic Amplitude: 9.25 mV
Lead Channel Sensing Intrinsic Amplitude: 9.25 mV
Lead Channel Setting Pacing Amplitude: 2 V
Lead Channel Setting Pacing Pulse Width: 0.4 ms
Lead Channel Setting Sensing Sensitivity: 0.3 mV
Zone Setting Status: 755011
Zone Setting Status: 755011

## 2023-11-09 LAB — LIPID PANEL

## 2023-11-09 NOTE — Patient Instructions (Addendum)
 Medication Instructions:   Your physician recommends that you continue on your current medications as directed. Please refer to the Current Medication list given to you today.   *If you need a refill on your cardiac medications before your next appointment, please call your pharmacy*   Lab Work:  PLEASE GO DOWN STAIRS  [facility]  FIRST FLOOR   ( GET OFF ELEVATORS WALK TOWARDS WAITING AREA LAB LOCATED BY PHARMACY):  LIPID  A1C     If you have labs (blood work) drawn today and your tests are completely normal, you will receive your results only by: MyChart Message (if you have MyChart) OR A paper copy in the mail If you have any lab test that is abnormal or we need to change your treatment, we will call you to review the results.    Testing/Procedures: NONE ORDERED  TODAY    Follow-Up: At [facility], you and your health needs are our priority.  As part of our continuing mission to provide you with exceptional heart care, our providers are all part of one team.  This team includes your primary Cardiologist (physician) and Advanced Practice Providers or APPs (Physician Assistants and Nurse Practitioners) who all work together to provide you with the care you need, when you need it.   Your next appointment:  IN NOVEMEBER  WITH GENERAL CARDIOLOGY  DR PIETRO /APP   6 month(s)  Provider:   You may see OLE ONEIDA HOLTS, MD  or one of the following Advanced Practice Providers on your designated Care Team:   Charlies Arthur, [place]   We recommend signing up for the patient portal called MyChart.  Sign up information is provided on this After Visit Summary.  MyChart is used to connect with patients for Virtual Visits (Telemedicine).  Patients are able to view lab/test results, encounter notes, upcoming appointments, etc.  Non-urgent messages can be sent to your provider as well.   To learn more about what you can do with MyChart, go to [email or web address].   Other  Instructions

## 2023-11-10 ENCOUNTER — Ambulatory Visit: Payer: Self-pay | Admitting: Physician Assistant

## 2023-11-10 LAB — LIPID PANEL
Cholesterol, Total: 106 mg/dL (ref 100–199)
HDL: 57 mg/dL (ref >39)
LDL CALC COMMENT:: 1.9 ratio (ref 0.0–5.0)
LDL Chol Calc (NIH): 35 mg/dL (ref 0–99)
Triglycerides: 63 mg/dL (ref 0–149)
VLDL Cholesterol Cal: 14 mg/dL (ref 5–40)

## 2023-11-10 LAB — HEMOGLOBIN A1C
Est. average glucose Bld gHb Est-mCnc: 243 mg/dL
Hgb A1c MFr Bld: 10.1 % — ABNORMAL HIGH (ref 4.8–5.6)

## 2023-11-17 NOTE — Telephone Encounter (Signed)
 LM with receptionist at [facility] requesting call back from nurse. Direct number provided. Will relay the following results:    Per Thom Sluder, PA-C: Your LDL looks great at 35. A1C is uncontrolled at 10%. Not sure who is following your diabetic care- but your insulin  needs to be adjusted by a provider to get better glucose control. Thank you

## 2024-01-06 NOTE — Telephone Encounter (Signed)
 Called medical center at [facility] x2 attempts; no answer, no option to leave message.

## 2024-01-09 ENCOUNTER — Ambulatory Visit (INDEPENDENT_AMBULATORY_CARE_PROVIDER_SITE_OTHER): Admitting: Podiatry

## 2024-01-09 ENCOUNTER — Encounter: Payer: Self-pay | Admitting: Podiatry

## 2024-01-09 DIAGNOSIS — M79675 Pain in left toe(s): Secondary | ICD-10-CM

## 2024-01-09 DIAGNOSIS — E119 Type 2 diabetes mellitus without complications: Secondary | ICD-10-CM

## 2024-01-09 DIAGNOSIS — M79674 Pain in right toe(s): Secondary | ICD-10-CM

## 2024-01-09 DIAGNOSIS — B351 Tinea unguium: Secondary | ICD-10-CM | POA: Diagnosis not present

## 2024-01-09 NOTE — Telephone Encounter (Signed)
 Spoke with James [name] at [facility]. She reports that there is no nurse available but provided fax number for labs to be faxed in for review. Onsite provider manages pt's insulin . She also stated that pt's insulin  dose has been adjusted as of last month. Fax number [phone].  Lab results faxed as requested. Confirmation received.

## 2024-01-13 NOTE — Progress Notes (Signed)
   Chief Complaint  Patient presents with   Debridement    Trim toenails - anywhere he can go to have his fingernails cut? 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  Patient is unable to trim their own nails. Patient is here for further evaluation and treatment.  Lab Results  Component Value Date   HGBA1C 10.1 (H) 11/09/2023   HGBA1C 9.0 (H) 11/19/2022   HGBA1C 8.9 (A) 09/14/2022     Past Medical History:  Diagnosis Date   Cellulitis and abscess of foot 09/29/2016   Chronic systolic heart failure (HCC)    Cocaine abuse (HCC)    Cocaine dependence with cocaine-induced mood disorder (HCC)    Depression    Diabetes mellitus without complication (HCC)    Essential hypertension    Multiple closed fractures of ribs of both sides    NICM (nonischemic cardiomyopathy) (HCC) EF 20-25%    Pneumonia of both lungs due to infectious organism 04/26/2016   Suicidal ideations 07/12/2017    No Known Allergies   OBJECTIVE General Patient is awake, alert, and oriented x 3 and in no acute distress. Derm Skin is dry and supple bilateral. Negative open lesions or macerations. Remaining integument unremarkable. Nails are tender, long, thickened and dystrophic with subungual debris, consistent with onychomycosis, 1-5 bilateral. No signs of infection noted. Vasc  DP and PT pedal pulses palpable bilaterally. Temperature gradient within normal limits.  Neuro grossly intact via light touch Musculoskeletal Exam No symptomatic pedal deformities noted bilateral. Muscular strength within normal limits.  ASSESSMENT 1. Diabetes Mellitus w/ peripheral neuropathy 2.  Pain due to onychomycosis of toenails bilateral  PLAN OF CARE -Patient evaluated today.  Comprehensive diabetic foot exam performed today.   Recommend following up with PCP or prison hospital physician/clinic for better diabetes management -Instructed to maintain good pedal hygiene and foot care. Stressed importance of controlling blood sugar.  -Mechanical debridement of nails 1-5 bilaterally performed using a nail nipper. Filed with dremel without incident.  -Return to clinic PRN    Thresa EMERSON Sar, DPM [facility]  Dr. Thresa EMERSON Sar, DPM    [address] [place], [place] [address]                Office [phone]  Fax [phone]

## 2024-05-11 ENCOUNTER — Telehealth: Payer: Self-pay

## 2024-05-11 NOTE — Telephone Encounter (Signed)
 [facility] facility called wanting to know if pt needs to have monitor in the jail. I have let them know he does, they will check and see if he has one or if he needs a new one

## 2024-05-15 ENCOUNTER — Telehealth: Payer: Self-pay | Admitting: Physician Assistant

## 2024-05-15 NOTE — Telephone Encounter (Signed)
 Per Faith at the [facility], she needs the visit note from 10/2023 with Charlies Arthur, PA where is it stated that patient needed to follow up with Dr. Pietro in November 2025 so patient can be authorized to go outside of the facility. She needs these notes faxed to her at [phone]. Please advise.

## 2024-07-23 ENCOUNTER — Ambulatory Visit: Admitting: General Practice

## 2024-07-23 ENCOUNTER — Ambulatory Visit: Admitting: Physician Assistant
